# Patient Record
Sex: Female | Born: 1950
Health system: Southern US, Community
[De-identification: ages and names within clinical notes are randomized; demographics above are authoritative.]

## PROBLEM LIST (undated history)

## (undated) DIAGNOSIS — F32A Depression, unspecified: Secondary | ICD-10-CM

## (undated) DIAGNOSIS — T7840XA Allergy, unspecified, initial encounter: Secondary | ICD-10-CM

## (undated) DIAGNOSIS — D649 Anemia, unspecified: Secondary | ICD-10-CM

## (undated) DIAGNOSIS — F419 Anxiety disorder, unspecified: Secondary | ICD-10-CM

## (undated) DIAGNOSIS — F329 Major depressive disorder, single episode, unspecified: Secondary | ICD-10-CM

## (undated) HISTORY — PX: TUBAL LIGATION: SHX77

## (undated) HISTORY — DX: Major depressive disorder, single episode, unspecified: F32.9

## (undated) HISTORY — DX: Depression, unspecified: F32.A

## (undated) HISTORY — PX: OTHER SURGICAL HISTORY: SHX169

## (undated) HISTORY — DX: Allergy, unspecified, initial encounter: T78.40XA

## (undated) HISTORY — DX: Anemia, unspecified: D64.9

---

## 2002-12-09 ENCOUNTER — Inpatient Hospital Stay (HOSPITAL_COMMUNITY): Admission: EM | Admit: 2002-12-09 | Discharge: 2002-12-15 | Payer: Self-pay | Admitting: Emergency Medicine

## 2002-12-10 ENCOUNTER — Encounter: Payer: Self-pay | Admitting: Cardiology

## 2002-12-14 ENCOUNTER — Encounter (INDEPENDENT_AMBULATORY_CARE_PROVIDER_SITE_OTHER): Payer: Self-pay | Admitting: Specialist

## 2004-03-02 ENCOUNTER — Ambulatory Visit: Payer: Self-pay | Admitting: Internal Medicine

## 2005-04-07 ENCOUNTER — Ambulatory Visit: Payer: Self-pay | Admitting: Internal Medicine

## 2006-09-19 ENCOUNTER — Encounter: Payer: Self-pay | Admitting: Internal Medicine

## 2006-11-24 ENCOUNTER — Telehealth (INDEPENDENT_AMBULATORY_CARE_PROVIDER_SITE_OTHER): Payer: Self-pay | Admitting: *Deleted

## 2006-11-29 ENCOUNTER — Telehealth: Payer: Self-pay | Admitting: Internal Medicine

## 2006-12-15 ENCOUNTER — Ambulatory Visit: Payer: Self-pay | Admitting: Internal Medicine

## 2006-12-15 ENCOUNTER — Observation Stay (HOSPITAL_COMMUNITY): Admission: AD | Admit: 2006-12-15 | Discharge: 2006-12-16 | Payer: Self-pay | Admitting: Internal Medicine

## 2006-12-15 DIAGNOSIS — J309 Allergic rhinitis, unspecified: Secondary | ICD-10-CM | POA: Insufficient documentation

## 2006-12-15 DIAGNOSIS — F329 Major depressive disorder, single episode, unspecified: Secondary | ICD-10-CM

## 2006-12-15 DIAGNOSIS — D509 Iron deficiency anemia, unspecified: Secondary | ICD-10-CM

## 2006-12-26 ENCOUNTER — Ambulatory Visit: Payer: Self-pay | Admitting: Internal Medicine

## 2006-12-26 DIAGNOSIS — R930 Abnormal findings on diagnostic imaging of skull and head, not elsewhere classified: Secondary | ICD-10-CM | POA: Insufficient documentation

## 2006-12-26 LAB — CONVERTED CEMR LAB
Basophils Absolute: 0 10*3/uL (ref 0.0–0.1)
Basophils Relative: 0.1 % (ref 0.0–1.0)
Eosinophils Absolute: 0.1 10*3/uL (ref 0.0–0.6)
Eosinophils Relative: 1.5 % (ref 0.0–5.0)
HCT: 32.2 % — ABNORMAL LOW (ref 36.0–46.0)
Hemoglobin: 10.5 g/dL — ABNORMAL LOW (ref 12.0–15.0)
Lymphocytes Relative: 30.3 % (ref 12.0–46.0)
MCHC: 32.6 g/dL (ref 30.0–36.0)
MCV: 64.6 fL — ABNORMAL LOW (ref 78.0–100.0)
Monocytes Absolute: 0.2 10*3/uL (ref 0.2–0.7)
Monocytes Relative: 3.6 % (ref 3.0–11.0)
Neutro Abs: 4.3 10*3/uL (ref 1.4–7.7)
Neutrophils Relative %: 64.5 % (ref 43.0–77.0)
Platelets: 454 10*3/uL — ABNORMAL HIGH (ref 150–400)
RBC: 4.99 M/uL (ref 3.87–5.11)
RDW: 29.2 % — ABNORMAL HIGH (ref 11.5–14.6)
Sed Rate: 57 mm/hr — ABNORMAL HIGH (ref 0–25)
WBC: 6.6 10*3/uL (ref 4.5–10.5)

## 2006-12-28 ENCOUNTER — Telehealth: Payer: Self-pay | Admitting: Internal Medicine

## 2008-10-03 ENCOUNTER — Telehealth: Payer: Self-pay | Admitting: Internal Medicine

## 2009-12-19 ENCOUNTER — Telehealth: Payer: Self-pay | Admitting: Internal Medicine

## 2010-02-10 NOTE — Assessment & Plan Note (Signed)
Summary: hosp follow up per dr k/mhf/pt rescd/ccm   Vital Signs:  Patient Profile:   60 Years Old Female Weight:      152 pounds O2 Sat:      94 % Temp:     98.2 degrees F oral Pulse rate:   100 / minute Pulse rhythm:   irregular BP sitting:   122 / 66  (left arm)  Vitals Entered By: Raechel Ache, RN (December 26, 2006 4:15 PM) Oxygen therapy Room Air                 Chief Complaint:  Hosp f/u Still coughing from post-nasal drip and SOB.Amanda Hall  History of Present Illness: 60 year old female seen today for follow-up of her severe anemia.  she has a hospitalized recently for severe anemia, related to hemorrhoidal bleeding. Chest x-ray and hostile, revealed pulmonary infiltrates worrisome for interstitial lung disease. She has had no further hemorrhoidal problems or bleeding but after approximately 1 year.  Has had onset of a menses over the weekend.  She is on t.i.d. iron  Current Allergies: ! SULFA ! * TETANUS ! CODEINE      Physical Exam  General:     Well-developed,well-nourished,in no acute distress; alert,appropriate and cooperative throughout examination   Head:     Normocephalic and atraumatic without obvious abnormalities. No apparent alopecia or balding. Eyes:     No corneal or conjunctival inflammation noted. EOMI. Perrla. Funduscopic exam benign, without hemorrhages, exudates or papilledema. Vision grossly normal. Mouth:     Oral mucosa and oropharynx without lesions or exudates.  Teeth in good repair. Neck:     No deformities, masses, or tenderness noted. Lungs:     a few crackles at the left base.  O2 saturation 94% Heart:     Normal rate and regular rhythm. S1 and S2 normal without gallop, murmur, click, rub or other extra sounds. Abdomen:     Bowel sounds positive,abdomen soft and non-tender without masses, organomegaly or hernias noted.    Impression & Recommendations:  Problem # 1:  ANEMIA-IRON DEFICIENCY (ICD-280.9)  Her updated medication  list for this problem includes:    Ferro-bob 325 (65 Fe) Mg Tabs (Ferrous sulfate) .Amanda Hall... 1 three times a day  Orders: Venipuncture (03474) TLB-CBC Platelet - w/Differential (85025-CBCD)   Complete Medication List: 1)  Prozac 20 Mg Caps (Fluoxetine hcl) .Amanda Hall.. 1 once daily 2)  Ambien 10 Mg Tabs (Zolpidem tartrate) 3)  Prozac 40 Mg Caps (Fluoxetine hcl) .Amanda Hall.. 1 once daily 4)  Ferro-bob 325 (65 Fe) Mg Tabs (Ferrous sulfate) .Amanda Hall.. 1 three times a day   Patient Instructions: 1)  Please schedule a follow-up appointment in 2 weeks. 2)  continue iron therapy; report any shortness of breath; will repeat chest x-ray in two weeks    ]  Appended Document: Orders Update    Clinical Lists Changes  Observations: Added new observation of COMMENTS2: ..................................................................Amanda KitchenWynona Canes, CMA  December 27, 2006 12:15 PM  (12/26/2006 12:14)       Laboratory Results   Blood Tests    Date/Time Reported: December 27, 2006 12:15 PM   SED rate: 32  Comments: ..................................................................Amanda KitchenWynona Canes, CMA  December 27, 2006 12:15 PM

## 2010-02-10 NOTE — Progress Notes (Signed)
Summary: Pt called req refill of generid Prozac to [facility]  Phone Note Refill Request Call back at Home Phone [phone] Message from:  Patient on December 19, 2009 10:56 AM  Refills Requested: Medication #1:  PROZAC 20 MG CAPS 1 once daily   Dosage confirmed as above?Dosage Confirmed  Medication #2:  PROZAC 40 MG  CAPS 1 once daily Pt does not have insurance. Can not afford ov. Pls call in generic Prozac to [facility] on [address].     Method Requested: Telephone to [facility] on [address] Initial call taken by: Lucy Antigua,  December 19, 2009 10:57 AM  Follow-up for Phone Call        last seen 2008 - please advise Follow-up by: Duard Brady LPN,  December 19, 2009 11:35 AM  Additional Follow-up for Phone Call Additional follow up Details #1::        generic prozac 20  #90  RF 2 Additional Follow-up by: [name]  MD,  December 19, 2009 5:12 PM    Additional Follow-up for Phone Call Additional follow up Details #3:: Additional Follow-up by: [name]  MD,  December 19, 2009 5:12 PM  Prescriptions: PROZAC 20 MG CAPS (FLUOXETINE HCL) 1 once daily  #90 x 2   Entered by:   Duard Brady LPN   Authorized by:   [name]  MD   Signed by:   Duard Brady LPN on 09/81/1914   Method used:   Faxed to ...       [facility]       [address]       [place], [place]  [address]       Ph: [phone]       Fax: [phone]   RxID:   [id number]

## 2010-05-26 NOTE — Discharge Summary (Signed)
NAME:  Amanda Hall, Amanda Hall NO.:  [id number]   MEDICAL RECORD NO.:  [id number]          PATIENT TYPE:  INP   LOCATION:  5029                         FACILITY:  [facility]   PHYSICIAN:  [name], MDDATE OF BIRTH:  January 02, 1951   DATE OF ADMISSION:  12/15/2006  DATE OF DISCHARGE:  12/16/2006                               DISCHARGE SUMMARY   FINAL DIAGNOSIS:  Severe iron deficiency anemia.   ADDITIONAL DIAGNOSES:  1. Hemorrhoidal lower gastrointestinal bleeding.  2. Profound weakness.   DISCHARGE MEDICATIONS:  1. Iron sulfate 325 mg t.i.d.  2. Prozac 40 mg daily.  3. Ambien 10 mg at bedtime p.r.n. sleep.   HISTORY OF PRESENT ILLNESS:  The patient is a 60 year old female who  presented with a 54-month history of progressive weakness, dyspnea on  exertion and fatigue.  She was admitted to the hospital approximately 4  years ago for profound iron deficiency anemia.  GI evaluation included  upper and lower panendoscopy at that time.  It was unremarkable except  for internal  and external hemorrhoids.  The patient states that she has  noted bright red rectal bleeding, usually twice per week.  She is  postmenopausal without menses in excess of 1 year.  The patient was  evaluated in the clinic setting, where she had a resting tachycardia of  120 and was profoundly pale and weak.  She was subsequently admitted for  further evaluation and treatment   LABORATORY DATA/HOSPITAL COURSE:  The patient was admitted to the  hospital, where laboratory studies were obtained.  This revealed a  hemoglobin of 7.9, hematocrit 25.9, MCV was 55.8 with an elevated RDW.  Platelet count was slightly high at 684,000.  The patient was typed and  crossed and transfused 2 units of packed RBCs without difficulty.  On  the second hospital day, the patient was much improved with stable vital  signs.  Her weakness and resting tachycardia had resolved.  Other  laboratory studies included a  ferritin of 5.  TSH was normal.  Metabolic  panel revealed mild hyponatremia, otherwise unremarkable.  Coagulation  studies were normal as was urinalysis.  Chest x-ray revealed diffuse  increased lung densities at both bases, the right greater than the left,  raising the question of ongoing interstitial lung disease.  When  hospitalized for severe anemia 4 years ago, the patient had much more  prominent diffuse infiltrates as well as shortness of breath and  hypoxemia in the setting of her severe anemia.   DISPOSITION:  The patient will be discharged today to take iron  supplements 3 times daily.  She will be reassessed in the office in 3  days, and followup electrolytes and hemoglobin will be checked.  She  will be set up for a  general surgical consultation for consideration of  treatment of her hemorrhoidal disease.      [name], MD  Electronically Signed     PFK/MEDQ  D:  12/16/2006  T:  12/16/2006  Job:  [phone] [id number]

## 2010-05-29 NOTE — H&P (Signed)
NAME:  SHANDREKA, DANTE NO.:  [id number]   MEDICAL RECORD NO.:  [id number]                   PATIENT TYPE:  INP   LOCATION:  2103                                 FACILITY:  [facility]   PHYSICIAN:  Wanda Plump, MD [facility]                 DATE OF BIRTH:  19-Jun-1950   DATE OF ADMISSION:  12/09/2002  DATE OF DISCHARGE:                                HISTORY & PHYSICAL   ADDENDUM   CLARIFICATION OF THE HISTORY AND PHYSICAL:  Mrs. Vanvoorhis has been admitted to  the hospital with respiratory distress, cough, diffuse bilateral  infiltrates, and severe anemia.  The potential explanation for her symptoms  are (a) atypical pneumonia which could account for the infiltrate, the  decreased O2 saturation with an incidental finding of anemia versus (b)  severe anemia that is induced in congestive heart failure.  That would  account for the respiratory distress and bilateral infiltrates.  I favor the  first scenario.                                                Wanda Plump, MD [facility]    JEP/MEDQ  D:  12/10/2002  T:  12/10/2002  Job:  [id number]

## 2010-05-29 NOTE — H&P (Signed)
NAME:  Amanda Hall, Amanda Hall NO.:  [id number]   MEDICAL RECORD NO.:  [id number]                   PATIENT TYPE:  EMS   LOCATION:  MAJO                                 FACILITY:  [facility]   PHYSICIAN:  Wanda Plump, MD [facility]                 DATE OF BIRTH:  [birth year]   DATE OF ADMISSION:  12/09/2002  DATE OF DISCHARGE:                                HISTORY & PHYSICAL   CHIEF COMPLAINT:  Shortness of breath.   HISTORY OF PRESENT ILLNESS:  Amanda Hall is a 60 year old white female whose  primary care physician is Amanda Hall.  She presented to the ER with  several days history of shortness of breath and dyspnea on exertion,  gradually getting worse.  The symptoms became more intense in the last 2  days to the point that she decided to come to the ER tonight.  She also has  noted persistent coughing spells and chest pain located in the upper  midanterior aspect of the chest and described as a dual ache without  radiation. The patient is chest-pain free at present.  At the ER she was  diagnosed with CHF, got a dose of Lasix and admitted for further care.   PAST MEDICAL HISTORY:  1. Depression.  2. History of hemorrhoids.  3. History of allergic rhinitis.  4. Bilateral tubal ligation.  5. History of biopsy of the left wrist many years ago which was negative.   FAMILY HISTORY:  1. Sister and grandmother had diabetes.  2. Sister has hypertension.  3. Positive family history of breast cancer.  4. Mother had a stroke.  5. Grandmother had coronary artery disease.  6. Remote family history of colon cancer.  She described that her great-     grandmother did have colon cancer.   SOCIAL HISTORY:  Does not smoke or drink.  She is divorced and has no  children.   REVIEW OF SYSTEMS:  Denies any fever or chills.  She has noticed decreased  appetite and some weight loss lately.  Besides today, she denies any nausea.  No vomiting or diarrhea.  She admits having some  bleeding stools on and off  for a while, but she cannot tell with certainty the amount of frequency of  the bleeding.  There is no melena.  Denies any chronic or acute abdominal  pain except for some discomfort when she coughs.  Denies any dysuria or  hematuria.  She has vaginal bleeding only during her periods which are  irregular and at times heavy.  Very little menstrual cramps.  She denies any  recent Pap smear.  She never had a colonoscopy.   REVIEW OF SYSTEMS:  The review of systems is also positive for mild-to-  moderate headache for 2 days.   MEDICATIONS:  1. Prozac 40 one p.o. daily.  2. Occasional use of decongestant and  Midol for menstrual cramps.   ALLERGIES:  She is allergic to Colace and tetanus.   PHYSICAL EXAMINATION:  VITAL SIGNS:  Initially her heart rate was 152 with a  blood pressure of 132/63; respiratory rate 32.  O2 saturation on room air  was 67. At the time of evaluation at 6 a.m. in the morning the blood  pressure was 125/61, pulse 87, respiratory rate 30 and she is on 100%  nonrebreather.  GENERAL:  The patient is alert and oriented.  She is extremely pale.  She  has moderate respiratory distress characterized by difficulty finishing her  sentences.  HEENT:  Ears are normal.  Oropharynx is negative.  Sinuses show no  congestion of pain to palpation.  NECK:  She does not have JVD.  LUNGS:  She has diffuse crackles in both fields.  CARDIOVASCULAR:  She is tachycardic.  It is hard for me to assess for heart  murmur.  ABDOMEN:  Abdomen is not distended, soft, with positive bowel sounds.  I did  not feel any mass or organomegaly.  RECTAL:  Digital rectal exam shows brown stool.  I think I palpate a mass  that is consistent with uterus.  Hemoccult is negative.   LABORATORY AND X-RAYS:  Sodium 156, potassium 3, BUN 10, creatinine 0.7,  blood sugar 160.  LFTs are normal except for an albumin of 2.8.  Total CK is  59.  Hemoglobin is 6.9 with a white count of  15.6 and platelets of 624.  MBC  is 54 which is low and MCHC is 29 which is also low.  BMP is moderately  increased to 126.  Chest x-ray showed diffuse infiltrate consistent either  with congestive heart failure or pneumonia.   ASSESSMENT AND PLAN:  1. Amanda Hall has been admitted to the hospital with respiratory distress,     cough, diffuse bilateral infiltrates, and severe anemia.  The potential     explanation for her symptoms are an atypical pneumonia which would     account for the infiltrate, the decreased O2 saturation with the     incidence and findings of anemia versus severe anemia that is induced in     the patient's who have CHF which would also explain the respiratory     distress and bilateral infiltrates. I favor the first scenario.  2. The patient has profound anemia.  The most likely diagnosis is     gastrointestinal etiology.  3. I will go ahead and admit the patient to the unit.  I have already called     the pulmonary and the gastrointestinal service to assist me in the care     of this patient.  I am planning to take an HIV, reticulocyte count, and     LDH.  Will also get 2 more cardiac enzymes to rule out an myocardial     infarction and will check a Legionella antigen.  Since my differential     diagnosis includes atypical pneumonia, I will cover her with Avelox.     Will also schedule an Hall abdominal ultrasound to evaluate the     liver, spleen and the female organs.                                                Wanda Plump, MD [facility]    JEP/MEDQ  D:  12/09/2002  T:  12/09/2002  Job:  [id number]

## 2010-05-29 NOTE — Discharge Summary (Signed)
NAME:  Amanda Hall, Amanda Hall                              ACCOUNT NO.:  [id number]   MEDICAL RECORD NO.:  [id number]                   PATIENT TYPE:  INP   LOCATION:  5017                                 FACILITY:  [facility]   PHYSICIAN:  Ranelle Oyster, M.D.             DATE OF BIRTH:  [birth year]   DATE OF ADMISSION:  12/08/2002  DATE OF DISCHARGE:  12/15/2002                                 DISCHARGE SUMMARY   DISCHARGE DIAGNOSES:  1. Iron-deficiency anemia.  2. Respiratory insufficiency.  3. Pneumonia.   BRIEF ADMISSION HISTORY:  The patient is a 60 year old white female who  presented with complaints of shortness of breath and dyspnea on exertion  that has progressively gotten worse.   PAST MEDICAL HISTORY:  1. Depression.  2. History of hemorrhoids.  3. Sinus congestion.  4. BTL.  5. Biopsy of left breast.  6. History of arm fracture.   HOSPITAL COURSE:  Problem #1.  Pulmonary:  The patient presented with  respiratory insufficiency and chest x-ray revealing diffuse infiltrate,  representing possible atypical pneumonia.  The patient was seen in  consultation by pulmonary critical care medicine who agreed atypical  pneumonia and suspected it was infectious bacterial versus viral.  They  recommended Avelox and supportive care.  The patient Legionella antigen was  negative.  The patient's condition slowly improved with antibiotic therapy  as well as nebulizers and oxygen.  She improved to the point that she was  tolerating room air and was ready for discharge on December 15, 2002.   Problem #2.  Microcytic iron-deficiency anemia.  The patient had hemoglobin  of 6.9 on admission,  Iron studies confirmed iron deficiency.  The patient  was seen in consultation by GI who agreed that she would need endoscopy and  colonoscopy once stable from a respiratory standpoint.  She was stable on  December 14, 2002.  Colonoscopy revealed only hemorrhoids.  Endoscopy  revealed a small hiatal hernia,  otherwise negative.  The patient did have an  air-contrast barium enema on December 14, 2002, that was unremarkable.  It  was felt that more than likely the etiology of her anemia was gynecologic in  nature, and she needed follow up as an outpatient with gynecologist.   LABORATORY DATA:  Hemoglobin 8.5, stool for occult blood was negative.  Calcium 3.1.  Glucose 123, alkaline phosphatase 118, otherwise LFT's were  normal.  TSH was 2.737.  Total iron 15.  HIV nonreactive.  Urinalysis was  negative.  Blood cultures were negative x2.  Legionella antigen was  negative.  The patient had received a total of two units of packed red blood  cells during hospitalization.   MEDICATIONS ON DISCHARGE:  1. Avelox 400 mg a day for an additional three days.  2. Prozac 40 mg a day.  3. Ferrous sulfate 325 mg b.i.d.   DISCHARGE INSTRUCTIONS:  The patient was to call  for eligibility appointment  with primary care physician.      Cornell Barman, P.A. [facility]                  Ranelle Oyster, M.D.    LC/MEDQ  D:  12/26/2002  T:  12/26/2002  Job:  [id number]   cc:   [name], M.D. [facility]   Iva Boop, M.D. [facility]

## 2010-05-29 NOTE — Consult Note (Signed)
Amanda Hall, Amanda Hall                              ACCOUNT NO.:  [id number]   MEDICAL RECORD NO.:  [id number]                   PATIENT TYPE:  INP   LOCATION:  2103                                 FACILITY:  [facility]   PHYSICIAN:  Anselmo Rod, M.D.               DATE OF BIRTH:  1950-05-30   DATE OF CONSULTATION:  12/09/2002  DATE OF DISCHARGE:                                   CONSULTATION   REASON FOR CONSULTATION:  Severe anemia with hemoglobin of 6.9 gm/dL and  pneumonia.   ASSESSMENT:  1. Severe microcytic anemia with hemoglobin 6.9 gm/dL and MCV of 16.1 and     history of intermittent rectal bleeding in a 60 year old white female     with family history of colon cancer in a great-grandmother who has     chronic polyps, masses, etc.  2. Diffuse bilateral infiltrates, questionable community-acquired pneumonia     on Avelox.  3. History of chronic constipation with rectal bleeding, especially on     straining.  4. History of depression on Prozac, which she has recently restarted.  5. History of allergic rhinitis.  6. Status post bilateral tubal ligations with two abortions in her 51s.   RECOMMENDATIONS:  1. Monitor CBC closely.  Transfuse to keep the hematocrit above 30%.  2. GI workup with EGD in the colon once the acute symptoms resolve from a     respiratory standpoint.  3. Check iron studies.  The patient has had blood samples taken prior to the     transfusion.  4. PPI prophylactically for now.   HISTORY OF PRESENT ILLNESS:  Amanda Hall is a 60 year old white female who has  the above-mentioned problems and has been busy taking care of her mother who  had a stroke four years ago.  Her mother was recently discharged from [facility] after prolonged hospitalization.  The patient  developed generalized fatigue and a cough which she felt was from being  under a lot of stress and strain mentally and physically from providing care  for her mother.  She developed  a cough two months ago which became  productive two weeks ago and was admitted to the hospital earlier today with  bilateral infiltrates seen on x-ray.  The patient was found to have  leukocytosis with severe anemia and upper GI consultation was procured.  The  patient denies any abdominal pain, nausea, or vomiting at this time.  She  had mild nausea on admission but this has now resolved.  There is no history  of melena.  She has noticed some bright red blood per rectum with straining  and has had this problem for several years.  She describes a small amount of  bleeding on the toilet paper after a bowel movement.  There is a large  amount of rectal bleeding at times with blood in the commode  but has never  had a GI workup in this regard.   She is seeing Dr. [name] for depression and received Prozac  from his office but has not taken this for several months.  She has  restarted taking the Prozac two days ago.  She denies a previous history of  jaundice or colitis.  Appetite is fair as her weight has been stable.  Her  sister thinks she may have lost some weight but the patient does not confirm  this.  There are no genitourinary complaints at this time.   ALLERGIES:  CODEINE, SULFA, TETANUS TOXOID.   MEDICATIONS:  Prozac.   SOCIAL HISTORY:  She is divorced.  She was married for three years in the  mid-70s.  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  She denies the  use of alcohol, tobacco, and drugs.   FAMILY HISTORY:  Her father is 37 and has disabling arthritis and cataracts.  Her mother is 68 and has had a stroke four years ago.  She has sister who  has diabetes and a brother who has back problems with sciatica.  There is a  history of heart disease in a grandmother and breast cancer in the family.  There is no known family history of ovarian or uterine cancer.   PHYSICAL  EXAMINATION:  GENERAL:  A pleasant, cooperative middle-aged white  female sitting upright in bed with nasal cannula in place.  VITAL SIGNS:  Blood pressure 146/85, pulse 108 per minute and regular,  respiratory rate 24 per minute.  HEENT:  Normocephalic and atraumatic head.  PERRLA.  Facial symmetry  preserved.  The patient has no teeth.  NECK:  Supple.  CHEST:  Decreased breath sounds bilaterally with occasional rhonchi.  ABDOMEN:  Soft, nontender, and nondistended with normal bowel sounds.  No  evidence of hepatosplenomegaly or masses palpable.  RECTAL:  Not done.  Stool was tested for occult blood but was negative.   LABORATORY DATA:  Hematocrit of 15.6, hemoglobin of 6.9 with a MCV of 54.7,  and platelets of 624,000.  Albumin is low at 2.8.  LFTs are normal.  BNP is  high at 126.   Chest x-ray revealed bilateral infiltrates.   DISPOSITION:  Considering this data, my recommendations have been made.  Further recommendations will be made by Dr. Iva Boop who will see the  patient in the morning and follow up from here on.                                               Anselmo Rod, M.D.    JNM/MEDQ  D:  12/09/2002  T:  12/09/2002  Job:  [id number]   cc:   [name], M.D. [facility]   Iva Boop, M.D. [facility]

## 2010-07-03 ENCOUNTER — Other Ambulatory Visit: Payer: Self-pay | Admitting: Internal Medicine

## 2010-09-21 ENCOUNTER — Encounter: Payer: Self-pay | Admitting: Internal Medicine

## 2010-09-21 ENCOUNTER — Ambulatory Visit (INDEPENDENT_AMBULATORY_CARE_PROVIDER_SITE_OTHER): Payer: Self-pay | Admitting: Internal Medicine

## 2010-09-21 DIAGNOSIS — D509 Iron deficiency anemia, unspecified: Secondary | ICD-10-CM

## 2010-09-21 DIAGNOSIS — J309 Allergic rhinitis, unspecified: Secondary | ICD-10-CM

## 2010-09-21 DIAGNOSIS — L509 Urticaria, unspecified: Secondary | ICD-10-CM

## 2010-09-21 MED ORDER — FLUOXETINE HCL 20 MG PO CAPS: 20.0000 mg | ORAL_CAPSULE | Freq: Every day | ORAL | Status: DC

## 2010-09-21 NOTE — Progress Notes (Signed)
  Subjective:    Patient ID: Amanda Hall, female    DOB: 05-30-1950, 60 y.o.   MRN: [id number]  HPI  60 year old patient who is here for chief complaint of hives. She states that she has had these intermittently for months. She has no health insurance and postponed the visit. She is under considerable situational stress 2 to the demands of her father who is 59 and quite demanding. She does not work outside the home and cares for her father. He has diabetes but refuses to see a physician. She complains of increasing stress. She has been on Prozac in the past which has run out refills. She feels she did better on this medication presently she is medication free period she has a history of iron deficiency anemia and also an abnormal chest x-ray worrisome for interstitial lung disease. She has no pulmonary complaints.   Review of Systems  Constitutional: Negative.   HENT: Negative for hearing loss, congestion, sore throat, rhinorrhea, dental problem, sinus pressure and tinnitus.   Eyes: Negative for pain, discharge and visual disturbance.  Respiratory: Negative for cough and shortness of breath.   Cardiovascular: Negative for chest pain, palpitations and leg swelling.  Gastrointestinal: Negative for nausea, vomiting, abdominal pain, diarrhea, constipation, blood in stool and abdominal distention.  Genitourinary: Negative for dysuria, urgency, frequency, hematuria, flank pain, vaginal bleeding, vaginal discharge, difficulty urinating, vaginal pain and pelvic pain.  Musculoskeletal: Negative for joint swelling, arthralgias and gait problem.  Skin: Positive for rash.  Neurological: Negative for dizziness, syncope, speech difficulty, weakness, numbness and headaches.  Hematological: Negative for adenopathy.  Psychiatric/Behavioral: Positive for dysphoric mood. Negative for behavioral problems and agitation. The patient is nervous/anxious.        Objective:   Physical Exam  Constitutional: She is  oriented to person, place, and time. She appears well-developed and well-nourished. No distress.  HENT:  Head: Normocephalic.  Right Ear: External ear normal.  Left Ear: External ear normal.  Mouth/Throat: Oropharynx is clear and moist.  Eyes: Conjunctivae and EOM are normal. Pupils are equal, round, and reactive to light.  Neck: Normal range of motion. Neck supple. No thyromegaly present.  Cardiovascular: Normal rate, regular rhythm, normal heart sounds and intact distal pulses.   Pulmonary/Chest: Effort normal and breath sounds normal.  Abdominal: Soft. Bowel sounds are normal. She exhibits no mass. There is no tenderness.  Musculoskeletal: Normal range of motion.  Lymphadenopathy:    She has no cervical adenopathy.  Neurological: She is alert and oriented to person, place, and time.  Skin: Skin is warm and dry. Rash noted.       Scattered urticarial lesions most marked on the lower extremities  Psychiatric: She has a normal mood and affect. Her behavior is normal.       Quite anxious          Assessment & Plan:   Urticaria. We'll give a shot of Depo-Medrol. Stress management discussed at length. We'll treat with H2 and H1 blockers

## 2010-09-21 NOTE — Patient Instructions (Signed)
Use Benadryl 25 mg every 6 hours as needed for hives  Consider Alavert one daily as needed for hives If  hives still persist use Pepcid 20 mg twice daily

## 2010-10-19 LAB — CROSSMATCH
ABO/RH(D): A POS
Antibody Screen: NEGATIVE

## 2010-10-19 LAB — URINALYSIS, ROUTINE W REFLEX MICROSCOPIC
Bilirubin Urine: NEGATIVE
Glucose, UA: NEGATIVE
Hgb urine dipstick: NEGATIVE
Ketones, ur: NEGATIVE
Nitrite: NEGATIVE
Protein, ur: NEGATIVE
Specific Gravity, Urine: 1.017
Urobilinogen, UA: 0.2
pH: 5.5

## 2010-10-19 LAB — HEMOGLOBIN AND HEMATOCRIT, BLOOD
HCT: 30.6 — ABNORMAL LOW
Hemoglobin: 9.4 — ABNORMAL LOW

## 2010-10-19 LAB — CBC
HCT: 25.9 — ABNORMAL LOW
Hemoglobin: 7.9 — CL
MCHC: 30.5
MCV: 55.8 — ABNORMAL LOW
Platelets: 684 — ABNORMAL HIGH
RBC: 4.64
RDW: 18 — ABNORMAL HIGH
WBC: 9

## 2010-10-19 LAB — COMPREHENSIVE METABOLIC PANEL
ALT: 11
AST: 22
Albumin: 3.1 — ABNORMAL LOW
Alkaline Phosphatase: 117
BUN: 12
CO2: 20
Calcium: 8.9
Chloride: 99
Creatinine, Ser: 1.19
GFR calc Af Amer: 57 — ABNORMAL LOW
GFR calc non Af Amer: 47 — ABNORMAL LOW
Glucose, Bld: 100 — ABNORMAL HIGH
Potassium: 3.7
Sodium: 129 — ABNORMAL LOW
Total Bilirubin: 1.1
Total Protein: 8.5 — ABNORMAL HIGH

## 2010-10-19 LAB — FERRITIN: Ferritin: 5 — ABNORMAL LOW (ref 10–291)

## 2010-10-19 LAB — TSH: TSH: 2.453

## 2010-10-19 LAB — PROTIME-INR
INR: 1
Prothrombin Time: 13.2

## 2010-10-19 LAB — ABO/RH: ABO/RH(D): A POS

## 2010-10-19 LAB — APTT: aPTT: 34

## 2011-09-19 ENCOUNTER — Other Ambulatory Visit: Payer: Self-pay | Admitting: Internal Medicine

## 2011-10-12 ENCOUNTER — Other Ambulatory Visit: Payer: Self-pay | Admitting: Internal Medicine

## 2012-06-02 ENCOUNTER — Telehealth: Payer: Self-pay | Admitting: Internal Medicine

## 2012-06-02 NOTE — Telephone Encounter (Signed)
Please call pt and schedule appt not seen since 2012 can not refill medication.

## 2012-06-02 NOTE — Telephone Encounter (Signed)
Patient declined office visit, stating that she did not have the money for one currently.

## 2012-06-02 NOTE — Telephone Encounter (Signed)
PT called to request a refill of her FLUoxetine (PROZAC) 20 MG capsule, [facility] [address]. Please assist.

## 2014-12-26 ENCOUNTER — Telehealth: Payer: Self-pay | Admitting: Internal Medicine

## 2014-12-26 ENCOUNTER — Ambulatory Visit (HOSPITAL_COMMUNITY)
Admission: RE | Admit: 2014-12-26 | Discharge: 2014-12-26 | Disposition: A | Discharge status: Home / Self Care | Payer: Self-pay | Attending: Psychiatry | Admitting: Psychiatry

## 2014-12-26 DIAGNOSIS — Z823 Family history of stroke: Secondary | ICD-10-CM | POA: Insufficient documentation

## 2014-12-26 DIAGNOSIS — Z833 Family history of diabetes mellitus: Secondary | ICD-10-CM | POA: Insufficient documentation

## 2014-12-26 DIAGNOSIS — K649 Unspecified hemorrhoids: Secondary | ICD-10-CM | POA: Insufficient documentation

## 2014-12-26 DIAGNOSIS — D649 Anemia, unspecified: Secondary | ICD-10-CM | POA: Insufficient documentation

## 2014-12-26 DIAGNOSIS — Z800 Family history of malignant neoplasm of digestive organs: Secondary | ICD-10-CM | POA: Insufficient documentation

## 2014-12-26 DIAGNOSIS — F329 Major depressive disorder, single episode, unspecified: Secondary | ICD-10-CM | POA: Insufficient documentation

## 2014-12-26 DIAGNOSIS — F411 Generalized anxiety disorder: Secondary | ICD-10-CM | POA: Insufficient documentation

## 2014-12-26 DIAGNOSIS — Z803 Family history of malignant neoplasm of breast: Secondary | ICD-10-CM | POA: Insufficient documentation

## 2014-12-26 DIAGNOSIS — Z8249 Family history of ischemic heart disease and other diseases of the circulatory system: Secondary | ICD-10-CM | POA: Insufficient documentation

## 2014-12-26 NOTE — Telephone Encounter (Signed)
Please call patient now. Appears she has not been seen in 4 years and [name] is not here today. Given her symptoms she is advised to go straight to the behavioral health hospital or the ED today.

## 2014-12-26 NOTE — BH Assessment (Signed)
Tele Assessment Note   Amanda Hall is an 65 y.o. female. Pt denies SI/HI. Pt denies AVH. Pt reports severe anxiety. Pt states she worries about everything. According to the Pt, she is most fearful of getting older and dying. Pt states she has never received mental health treatment but she was prescribed Prozac in 2012 by her PCP. Pt reports depressive symptoms throughout her life. Pt also reports many traumas in her life i.e. Caring for both parents before their deaths. Pt is seeking medication to "calm her nerves." Pt denies SA. Pt denies abuse.   Writer consulted with Amanda Hall, Amanda Hall. Per Amanda Hall, Amanda Hall Pt does not meet inpatient treatment. Pt provided with outpatient resources.  Diagnosis: F41.1 Generalized anxiety disorder  Past Medical History:  Past Medical History  Diagnosis Date  . Hemorrhoids   . Anemia   . Allergy   . Depression     Past Surgical History  Procedure Laterality Date  . Tubal ligation    . Arm surgery      Left    Family History:  Family History  Problem Relation Age of Onset  . Stroke Mother   . Hypertension Sister   . Cancer Neg Hx     breat and colon hx  . Diabetes Neg Hx     family    Social History:  reports that she has never smoked. She has never used smokeless tobacco. She reports that she does not drink alcohol or use illicit drugs.  Additional Social History:  Alcohol / Drug Use Pain Medications: Pt denies Prescriptions: Pt denies Over the Counter: "holistic medication" History of alcohol / drug use?: No history of alcohol / drug abuse Longest period of sobriety (when/how long): NA  CIWA:   COWS:    PATIENT STRENGTHS: (choose at least two) Average or above average intelligence Communication skills  Allergies:  Allergies  Allergen Reactions  . Codeine   . Sulfonamide Derivatives   . Tetanus Toxoid     Home Medications:  (Not in a hospital admission)  OB/GYN Status:  No LMP recorded. Patient is postmenopausal.  General Assessment  Data Location of Assessment: [facility] Assessment Services TTS Assessment: In system Is this a Tele or Face-to-Face Assessment?: Face-to-Face Is this an Initial Assessment or a Re-assessment for this encounter?: Initial Assessment Marital status: Single Maiden name: NA Is patient pregnant?: No Pregnancy Status: No Living Arrangements: Other relatives Can pt return to current living arrangement?: Yes Admission Status: Voluntary Is patient capable of signing voluntary admission?: Yes Referral Source: Self/Family/Friend Insurance type: SP     Crisis Care Plan Living Arrangements: Other relatives Legal Guardian: Other: (NA) Name of Psychiatrist: NA Name of Therapist: NA  Education Status Is patient currently in school?: No Current Grade: NA Highest grade of school patient has completed: 12 Name of school: NA Contact person: NA  Risk to self with the past 6 months Suicidal Ideation: No Has patient been a risk to self within the past 6 months prior to admission? : No Suicidal Intent: No Has patient had any suicidal intent within the past 6 months prior to admission? : No Is patient at risk for suicide?: No Suicidal Plan?: No Has patient had any suicidal plan within the past 6 months prior to admission? : No Access to Means: No What has been your use of drugs/alcohol within the last 12 months?: NA Previous Attempts/Gestures: No How many times?: 0 Other Self Harm Risks: NA Triggers for Past Attempts: None known Intentional Self Injurious Behavior: None  Family Suicide History: No Recent stressful life event(s): Other (Comment) Persecutory voices/beliefs?: No Depression: Yes Depression Symptoms: Loss of interest in usual pleasures, Fatigue, Feeling worthless/self pity, Feeling angry/irritable Substance abuse history and/or treatment for substance abuse?: No Suicide prevention information given to non-admitted patients: Not applicable  Risk to Others within the past 6  months Homicidal Ideation: No Does patient have any lifetime risk of violence toward others beyond the six months prior to admission? : No Thoughts of Harm to Others: No Current Homicidal Intent: No Current Homicidal Plan: No Access to Homicidal Means: No Identified Victim: NA History of harm to others?: No Assessment of Violence: None Noted Violent Behavior Description: NA Does patient have access to weapons?: No Criminal Charges Pending?: No Does patient have a court date: No Is patient on probation?: No  Psychosis Hallucinations: None noted Delusions: None noted  Mental Status Report Appearance/Hygiene: Unremarkable Eye Contact: Fair Motor Activity: Freedom of movement Speech: Logical/coherent Level of Consciousness: Alert Mood: Depressed, Sad Affect: Depressed, Sad Anxiety Level: None Thought Processes: Coherent, Relevant Judgement: Unimpaired Orientation: Person, Place, Time, Situation Obsessive Compulsive Thoughts/Behaviors: None  Cognitive Functioning Concentration: Normal Memory: Recent Intact, Remote Intact IQ: Average Insight: Fair Impulse Control: Fair Appetite: Poor Weight Loss: 0 Weight Gain: 0 Sleep: Decreased Total Hours of Sleep: 4 Vegetative Symptoms: None  ADLScreening [facility]) Patient's cognitive ability adequate to safely complete daily activities?: Yes Patient able to express need for assistance with ADLs?: Yes Independently performs ADLs?: Yes (appropriate for developmental age)  Prior Inpatient Therapy Prior Inpatient Therapy: No Prior Therapy Dates: NA Prior Therapy Facilty/Provider(s): na Reason for Treatment: NA  Prior Outpatient Therapy Prior Outpatient Therapy: No Prior Therapy Dates: NA Prior Therapy Facilty/Provider(s): NA Reason for Treatment: NA Does patient have an ACCT team?: No Does patient have Intensive In-House Services?  : No Does patient have [facility] services? : No Does patient have P4CC  services?: No  ADL Screening (condition at time of admission) Patient's cognitive ability adequate to safely complete daily activities?: Yes Is the patient deaf or have difficulty hearing?: No Does the patient have difficulty seeing, even when wearing glasses/contacts?: No Does the patient have difficulty concentrating, remembering, or making decisions?: No Patient able to express need for assistance with ADLs?: Yes Does the patient have difficulty dressing or bathing?: No Independently performs ADLs?: Yes (appropriate for developmental age) Does the patient have difficulty walking or climbing stairs?: No Weakness of Legs: None Weakness of Arms/Hands: None       Abuse/Neglect Assessment (Assessment to be complete while patient is alone) Physical Abuse: Denies Verbal Abuse: Denies Sexual Abuse: Denies Exploitation of patient/patient's resources: Denies Self-Neglect: Denies Values / Beliefs Cultural Requests During Hospitalization: None Spiritual Requests During Hospitalization: None   Advance Directives (For Healthcare) Does patient have an advance directive?: No Would patient like information on creating an advanced directive?: No - patient declined information    Additional Information 1:1 In Past 12 Months?: No CIRT Risk: No Elopement Risk: No Does patient have medical clearance?: No     Disposition:  Disposition Initial Assessment Completed for this Encounter: Yes Disposition of Patient: Outpatient treatment Type of outpatient treatment: Adult  Amanda Hall 12/26/2014 6:25 PM

## 2014-12-26 NOTE — Telephone Encounter (Signed)
Ms. Amanda Hall called to see if she can be seen today. She doesn't think she can wait until Monday to see [name] due to her anxiety level being extreme today. She said she has feelings of depression, anxiety, paranoia, and doesn't feel safe. She wanted to be seen asap so I scheduled her with [name] tomorrow in addition to her Monday visit with [name]. Please give her a call regarding this.   Pt's ph# [phone] Thank you.

## 2014-12-26 NOTE — Telephone Encounter (Signed)
Returned call to patient per Dr. [name]'s instructions.  Pt reports experiencing severe anxiety, depression, paranoia, loss of appetite, weight loss, mood swings and uncontrollable crying.  Pt denies suicidal ideations.  However, states she has a fear of dying and does not wish to harm herself or others.  Symptoms started in April 2016 and have progressively worsened, with the last few days being the worse.  Pt reports switching to all natural medications as she sees a herbal specialist by the name of Amanda Hall.  She started taking several herbal medications back in August 2016 that she continues to takes presently.  Pt instructed to seek treatment at behavioral health or the ED immediately.  Pt agreed to seek treatment at behavioral health and will have her brother Amanda Hall take her there now.  Pt informed her appointment with [name] will be cancelled for tomorrow.  However, the original appointment she had with Dr. [name] on Monday, 12/19 will remain.

## 2014-12-27 ENCOUNTER — Ambulatory Visit: Payer: Self-pay | Admitting: Family Medicine

## 2014-12-30 ENCOUNTER — Encounter: Payer: Self-pay | Admitting: Internal Medicine

## 2014-12-30 ENCOUNTER — Ambulatory Visit (INDEPENDENT_AMBULATORY_CARE_PROVIDER_SITE_OTHER): Payer: Self-pay | Admitting: Internal Medicine

## 2014-12-30 VITALS — BP 146/90 | HR 116 | Temp 98.5°F | Resp 18 | Ht 67.0 in | Wt 134.0 lb

## 2014-12-30 DIAGNOSIS — F32A Depression, unspecified: Secondary | ICD-10-CM

## 2014-12-30 DIAGNOSIS — F329 Major depressive disorder, single episode, unspecified: Secondary | ICD-10-CM

## 2014-12-30 MED ORDER — LORAZEPAM 0.5 MG PO TABS: 0.5000 mg | ORAL_TABLET | Freq: Two times a day (BID) | ORAL | Status: DC | PRN

## 2014-12-30 MED ORDER — LORAZEPAM 0.5 MG PO TABS
0.5000 mg | ORAL_TABLET | Freq: Two times a day (BID) | ORAL | Status: DC | PRN
Start: 2014-12-30 — End: 2014-12-30

## 2014-12-30 MED ORDER — ESCITALOPRAM OXALATE 5 MG PO TABS: 5.0000 mg | ORAL_TABLET | Freq: Every day | ORAL | Status: DC

## 2014-12-30 NOTE — Progress Notes (Signed)
Subjective:    Patient ID: Amanda Hall, female    DOB: 11-04-1950, 64 y.o.   MRN: [id number]  HPI  64 year old patient who has a long history of depression.  She was last seen in 04-Jun-2010 and is seen infrequently.  She has no health insurance and does not qualify for Medicaid.  She has been treated with fluoxetine in the past. She has done poorly since 2022-06-04 when she noted increasing anxiety, excessive worry and fear of death.  She has been quite tearful, depressed, nervous.  She ventures out of her home infrequently and spends most days in bed.  She complains of poor appetite and weight loss. She states that she continues to have spotting and has never had total cessation of menses.  She takes supplemental iron  She lives in [place] for a period of time to take care of her elderly mother and relocated to [place] in 06/04/99.  She assisted in the care of her father who died in 04-Jun-2011.  Presently she lives with a sister and also has 2 dogs in the household.  A brother lives next door  Past Medical History  Diagnosis Date  . Hemorrhoids   . Anemia   . Allergy   . Depression     Social History   Social History  . Marital Status: Divorced    Spouse Name: N/A  . Number of Children: N/A  . Years of Education: N/A   Occupational History  . Not on file.   Social History Main Topics  . Smoking status: Never Smoker   . Smokeless tobacco: Never Used  . Alcohol Use: No  . Drug Use: No  . Sexual Activity: Not on file   Other Topics Concern  . Not on file   Social History Narrative    Past Surgical History  Procedure Laterality Date  . Tubal ligation    . Arm surgery      Left    Family History  Problem Relation Age of Onset  . Stroke Mother   . Hypertension Sister   . Cancer Neg Hx     breat and colon hx  . Diabetes Neg Hx     family    Allergies  Allergen Reactions  . Codeine   . Sulfonamide Derivatives   . Tetanus Toxoid     Current Outpatient Prescriptions on File  Prior to Visit  Medication Sig Dispense Refill  . ferrous sulfate 325 (65 FE) MG tablet Take 325 mg by mouth 3 (three) times daily with meals.       No current facility-administered medications on file prior to visit.    BP 146/90 mmHg  Pulse 116  Temp(Src) 98.5 F (36.9 C) (Oral)  Resp 18  Ht  (1.702 m)  Wt 134 lb (60.782 kg)  BMI 20.98 kg/m2  SpO2 98%     Review of Systems  Constitutional: Positive for activity change, appetite change, fatigue and unexpected weight change.  HENT: Negative for congestion, dental problem, hearing loss, rhinorrhea, sinus pressure, sore throat and tinnitus.   Eyes: Negative for pain, discharge and visual disturbance.  Respiratory: Negative for cough and shortness of breath.   Cardiovascular: Negative for chest pain, palpitations and leg swelling.  Gastrointestinal: Negative for nausea, vomiting, abdominal pain, diarrhea, constipation, blood in stool and abdominal distention.  Genitourinary: Negative for dysuria, urgency, frequency, hematuria, flank pain, vaginal bleeding, vaginal discharge, difficulty urinating, vaginal pain and pelvic pain.  Musculoskeletal: Negative for joint swelling, arthralgias and gait  problem.  Skin: Negative for rash.  Neurological: Negative for dizziness, syncope, speech difficulty, weakness, numbness and headaches.  Hematological: Negative for adenopathy.  Psychiatric/Behavioral: Positive for behavioral problems, dysphoric mood and decreased concentration. Negative for agitation. The patient is nervous/anxious.        Objective:   Physical Exam  Constitutional: She is oriented to person, place, and time. She appears well-developed and well-nourished.  HENT:  Head: Normocephalic.  Right Ear: External ear normal.  Left Ear: External ear normal.  Mouth/Throat: Oropharynx is clear and moist.  Eyes: Conjunctivae and EOM are normal. Pupils are equal, round, and reactive to light.  Neck: Normal range of motion. Neck  supple. No thyromegaly present.  Cardiovascular: Normal rate, regular rhythm, normal heart sounds and intact distal pulses.   Pulmonary/Chest: Effort normal and breath sounds normal.  Abdominal: Soft. Bowel sounds are normal. She exhibits no mass. There is no tenderness.  Musculoskeletal: Normal range of motion.  Lymphadenopathy:    She has no cervical adenopathy.  Neurological: She is alert and oriented to person, place, and time.  Skin: Skin is warm and dry. No rash noted.  Psychiatric: She has a normal mood and affect. Her behavior is normal. Judgment and thought content normal.  Depressed affect          Assessment & Plan:   Major depression.  Will place on Lexapro 5 mg daily.  We'll also place on lorazepam twice a day when necessary.  Anticipate up titration in Lexapro dose in 2-4 weeks Health maintenance.  [facility]  Needs ongoing medical care.  Gynecologic referral.  Updated lab.  Patient information dispensed

## 2014-12-30 NOTE — Progress Notes (Signed)
Pre visit review using our clinic review tool, if applicable. No additional management support is needed unless otherwise documented below in the visit note. 

## 2014-12-30 NOTE — Patient Instructions (Signed)
Follow-up with [facility] Please call for appointment  [phone] 4 4  4

## 2014-12-31 ENCOUNTER — Telehealth: Payer: Self-pay | Admitting: Internal Medicine

## 2014-12-31 MED ORDER — SERTRALINE HCL 25 MG PO TABS: 25.0000 mg | ORAL_TABLET | Freq: Every day | ORAL | Status: DC

## 2014-12-31 NOTE — Telephone Encounter (Signed)
Spoke to pt, told her new Rx sent to pharmacy Zoloft 25 mg one tablet daily. Let me know if too expensive there is one more drug we can try. Pt verbalized understanding.

## 2014-12-31 NOTE — Telephone Encounter (Signed)
Please see message and advise 

## 2014-12-31 NOTE — Telephone Encounter (Signed)
Please call in a prescription for generic Zoloft 25 mg or generic Celexa 20 mg  #60 each.  Either will be acceptable, and hopefully, much less expensive than the generic Lexapro

## 2014-12-31 NOTE — Telephone Encounter (Signed)
Ms. Amanda Hall called saying she called [facility] in regards to the price of the Generic Lexapro and was told 60 days = $204. She said she can't afford that and is wondering if [name] will send a Rx that will cost much less so she can pick it up. She'd like a phone call regarding this whether he can or can't prescribe something else.  Pt's ph# [phone] Thank you.

## 2014-12-31 NOTE — Telephone Encounter (Signed)
Spoke to pt, asked her if she picked up Rx? Pt said yes she was able to get it and she took one when she got home. Told her okay call if any problems. Pt verbalized understanding.

## 2015-01-14 ENCOUNTER — Telehealth: Payer: Self-pay | Admitting: Internal Medicine

## 2015-01-14 MED ORDER — LORAZEPAM 0.5 MG PO TABS: 0.5000 mg | ORAL_TABLET | Freq: Two times a day (BID) | ORAL | Status: DC | PRN

## 2015-01-14 NOTE — Telephone Encounter (Signed)
Pt was last seen on 12/19 and pt would like donna to return her call concerning [facility]

## 2015-01-14 NOTE — Telephone Encounter (Signed)
Spoke to pt, said [facility] and was told they are not accepting new patients at present to try back Feb. Pt said she was given another number for [facility] that they have an area there like them that may have appts. Pt said she tried the number but no one answered and she did not leave a message. Pt wonder if she should follow up with [name] in a month  Pt said she is doing better, taking Lorazepam twice a day and is helping but will not have enough will need new Rx and changed pharmacy to [facility] on [address]. Told pt okay will check with [name] if okay to give her 60 tablets on new Rx and will call into pharmacy. If there is a problem I will call you. Pt verbalized understanding. Told pt I would try that number again and leave a message and if can not get an appt would [facility] at the end of the month of Jan if still not taking new pt's can then schedule with [name]. Pt verbalized understanding.   Disccused Lorazepam with [name] okay to increase to 60 tablets so pt can take twice a day. Rx called into pharmacy.

## 2015-02-10 ENCOUNTER — Encounter: Payer: Self-pay | Admitting: Internal Medicine

## 2015-02-10 ENCOUNTER — Telehealth: Payer: Self-pay | Admitting: Internal Medicine

## 2015-02-10 ENCOUNTER — Ambulatory Visit (INDEPENDENT_AMBULATORY_CARE_PROVIDER_SITE_OTHER): Payer: Self-pay | Admitting: Internal Medicine

## 2015-02-10 VITALS — BP 150/90 | HR 80 | Temp 97.4°F | Resp 20 | Ht 67.0 in | Wt 130.0 lb

## 2015-02-10 DIAGNOSIS — F32A Depression, unspecified: Secondary | ICD-10-CM

## 2015-02-10 DIAGNOSIS — F329 Major depressive disorder, single episode, unspecified: Secondary | ICD-10-CM

## 2015-02-10 MED ORDER — FLUOXETINE HCL 20 MG PO TABS: 20.0000 mg | ORAL_TABLET | Freq: Every day | ORAL | Status: DC

## 2015-02-10 MED ORDER — LORAZEPAM 0.5 MG PO TABS: 0.5000 mg | ORAL_TABLET | Freq: Two times a day (BID) | ORAL | Status: DC | PRN

## 2015-02-10 MED ORDER — FLUOXETINE HCL 20 MG PO CAPS: 20.0000 mg | ORAL_CAPSULE | Freq: Every day | ORAL | Status: DC

## 2015-02-10 MED ORDER — FLUOXETINE HCL 20 MG PO TABS
20.0000 mg | ORAL_TABLET | Freq: Every day | ORAL | Status: DC
Start: 2015-02-10 — End: 2015-02-10

## 2015-02-10 NOTE — Telephone Encounter (Signed)
FLUoxetine (PROZAC) 20 MG tablet   Pt said this med is 98.00. Was told by pharmacy capsule is cheaper.    Pharmacy' [facility] [address]

## 2015-02-10 NOTE — Telephone Encounter (Signed)
ok 

## 2015-02-10 NOTE — Telephone Encounter (Signed)
Okay if I change tablet to a capsule due to cost?

## 2015-02-10 NOTE — Telephone Encounter (Signed)
Rx sent to pharmacy, capsule instead of tablet.

## 2015-02-10 NOTE — Patient Instructions (Signed)
Call or return to clinic prn if these symptoms worsen or fail to improve as anticipated.  Please call again [facility]  Consider applying for Medicaid

## 2015-02-10 NOTE — Progress Notes (Signed)
Subjective:    Patient ID: Amanda Hall, female    DOB: Mar 09, 1950, 65 y.o.   MRN: [id number]  HPI Wt Readings from Last 3 Encounters:  02/10/15 130 lb (58.968 kg)  12/30/14 134 lb (60.782 kg)  09/21/10 173 lb (78.48 kg)   65 year old patient who is seen today for follow-up of anxiety, depression.  She states that she is about the same as far as the depression, but on closer questioning, she states that she is having much less excess  Worry, her appetite has improved and she is having fewer mood swings. She was placed on sertraline but has also done quite well with fluoxetine in the past.  She is using lorazepam twice daily, which has also been quite helpful.  She is asking about a dose increase  She has no insurance and was referred to [facility].  She was told to call back in a month since no appointments were available  Past Medical History  Diagnosis Date  . Hemorrhoids   . Anemia   . Allergy   . Depression     Social History   Social History  . Marital Status: Divorced    Spouse Name: N/A  . Number of Children: N/A  . Years of Education: N/A   Occupational History  . Not on file.   Social History Main Topics  . Smoking status: Never Smoker   . Smokeless tobacco: Never Used  . Alcohol Use: No  . Drug Use: No  . Sexual Activity: Not on file   Other Topics Concern  . Not on file   Social History Narrative    Past Surgical History  Procedure Laterality Date  . Tubal ligation    . Arm surgery      Left    Family History  Problem Relation Age of Onset  . Stroke Mother   . Hypertension Sister   . Cancer Neg Hx     breat and colon hx  . Diabetes Neg Hx     family    Allergies  Allergen Reactions  . Codeine   . Sulfonamide Derivatives   . Tetanus Toxoid     Current Outpatient Prescriptions on File Prior to Visit  Medication Sig Dispense Refill  . ferrous sulfate 325 (65 FE) MG tablet Take 325 mg by mouth 3 (three)  times daily with meals.      Marland Kitchen LORazepam (ATIVAN) 0.5 MG tablet Take 1 tablet (0.5 mg total) by mouth 2 (two) times daily as needed for anxiety. 60 tablet 2  . sertraline (ZOLOFT) 25 MG tablet Take 1 tablet (25 mg total) by mouth daily. 60 tablet 1   No current facility-administered medications on file prior to visit.    BP 150/90 mmHg  Pulse 80  Temp(Src) 97.4 F (36.3 C) (Oral)  Resp 20  Ht  (1.702 m)  Wt 130 lb (58.968 kg)  BMI 20.36 kg/m2  SpO2 98%     Review of Systems  Constitutional: Negative.   HENT: Negative for congestion, dental problem, hearing loss, rhinorrhea, sinus pressure, sore throat and tinnitus.   Eyes: Negative for pain, discharge and visual disturbance.  Respiratory: Negative for cough and shortness of breath.   Cardiovascular: Negative for chest pain, palpitations and leg swelling.  Gastrointestinal: Negative for nausea, vomiting, abdominal pain, diarrhea, constipation, blood in stool and abdominal distention.  Genitourinary: Negative for dysuria, urgency, frequency, hematuria, flank pain, vaginal bleeding, vaginal discharge, difficulty urinating, vaginal pain and  pelvic pain.  Musculoskeletal: Negative for joint swelling, arthralgias and gait problem.  Skin: Negative for rash.  Neurological: Negative for dizziness, syncope, speech difficulty, weakness, numbness and headaches.  Hematological: Negative for adenopathy.  Psychiatric/Behavioral: Positive for behavioral problems and dysphoric mood. Negative for agitation. The patient is nervous/anxious.        Objective:   Physical Exam  Constitutional: She appears well-nourished. No distress.  Does not appear to be depressed Repeat blood pressure 130/80  Eyes: Conjunctivae are normal. Pupils are equal, round, and reactive to light.  Neck: Neck supple. No thyromegaly present.  Cardiovascular: Regular rhythm.   Pulmonary/Chest: Effort normal and breath sounds normal.  Musculoskeletal: She exhibits no  edema.          Assessment & Plan:   Anxiety, depression.  Patient states that she felt she had a better response to fluoxetine.  We'll switch Continue lorazepam when necessary She will continue her efforts at follow-up at the [facility] She has also been urged to apply for [facility]

## 2015-02-10 NOTE — Progress Notes (Signed)
Pre visit review using our clinic review tool, if applicable. No additional management support is needed unless otherwise documented below in the visit note. 

## 2015-06-24 ENCOUNTER — Telehealth: Payer: Self-pay | Admitting: Internal Medicine

## 2015-06-24 NOTE — Telephone Encounter (Signed)
Pt would like to increase lorazepam 0.5 mg to next highest dose [facility] [address],

## 2015-06-24 NOTE — Telephone Encounter (Signed)
Please see message and advise, pt last seen 01/2015.

## 2015-06-24 NOTE — Telephone Encounter (Signed)
Okay to increase to a 3 times a day regimen as needed.  Continue present dose

## 2015-06-24 NOTE — Telephone Encounter (Signed)
Spoke to pt, told her okay to increase to 3 times a day as needed, same dose. Pt verbalized understanding. Told pt need to schedule follow up appt in 2 weeks tomake sure on correct dose. Pt verbalized understanding.

## 2015-07-04 ENCOUNTER — Telehealth: Payer: Self-pay | Admitting: Internal Medicine

## 2015-07-04 MED ORDER — LORAZEPAM 0.5 MG PO TABS: 0.5000 mg | ORAL_TABLET | Freq: Three times a day (TID) | ORAL | Status: DC | PRN

## 2015-07-04 NOTE — Telephone Encounter (Signed)
Spoke to pt, told her Dr.K is happy to see her anytime she feels she needs to be seen. Pt verbalized understanding. Asked pt if being followed by the [facility]? Pt said no, every time she calls there are no appts. Told her okay, will let Dr.K know. I called refills into the pharmacy for you for 6 months try to make an appt before refills run out to follow up. Pt verbalized understanding.

## 2015-07-04 NOTE — Telephone Encounter (Signed)
Please advise when pt needs to come back, see message. I already called refill in.

## 2015-07-04 NOTE — Telephone Encounter (Signed)
Pt states her increase of LORazepam (ATIVAN) 0.5 MG tablet To 3 x/day is working good.  Pt will need a new rx  [facility] [employer] [address]  Pt would lke to know if she needs a follow up any time soon?

## 2015-07-04 NOTE — Telephone Encounter (Signed)
We'll be happy to see any time Last visit, she had no health insurance Is she  being [facility]?

## 2015-08-12 ENCOUNTER — Telehealth: Payer: Self-pay | Admitting: Internal Medicine

## 2015-08-12 NOTE — Telephone Encounter (Signed)
Pt has been summoned for jury and would like to request a letter from Dr Kirtland Bouchard stating for her to be excused from jury duty. Pt states with her anxiety she cannot be around people, it's just too stressful.  Pt is going to bring the paperwork for courthouse this afternoon.  Juror LE:[id number] Panel no:  [id number]   Attn: Volanda Napoleon Request [address] [place] [address]

## 2015-08-18 NOTE — Telephone Encounter (Signed)
Letter dictated August 2

## 2015-08-18 NOTE — Telephone Encounter (Signed)
Requesting a Letter From [name] stating for her to be excused from jury duty.  See Attached note.

## 2015-10-19 ENCOUNTER — Other Ambulatory Visit: Payer: Self-pay | Admitting: Internal Medicine

## 2015-10-20 NOTE — Telephone Encounter (Signed)
Refill for Fluoxetine and Lorazepam Last Ov: 02/10/15 no pending appt Last Rx: Lorazepam 07/04/15 #90 2 RF                Fluoxetine 02/10/15 # 90 3 RF Please advise

## 2015-10-24 NOTE — Telephone Encounter (Signed)
Please advise on refills.  

## 2015-10-24 NOTE — Telephone Encounter (Signed)
Ok to refill Prozac for six months  Ok to refill Ativan 90 pills, 0 refills

## 2015-10-28 ENCOUNTER — Telehealth: Payer: Self-pay | Admitting: Internal Medicine

## 2015-10-28 ENCOUNTER — Other Ambulatory Visit: Payer: Self-pay | Admitting: Internal Medicine

## 2015-10-28 NOTE — Telephone Encounter (Signed)
Pt need new Rx for FLUoxetine and LORazepam   [facility] [address]  Pt state is out.

## 2015-10-28 NOTE — Telephone Encounter (Signed)
Rxs were sent to pharmacy

## 2015-12-02 ENCOUNTER — Other Ambulatory Visit: Payer: Self-pay | Admitting: Internal Medicine

## 2016-03-29 ENCOUNTER — Other Ambulatory Visit: Payer: Self-pay | Admitting: Adult Health

## 2016-03-29 ENCOUNTER — Other Ambulatory Visit: Payer: Self-pay | Admitting: Internal Medicine

## 2016-03-30 NOTE — Telephone Encounter (Signed)
Dr K patient

## 2016-04-02 ENCOUNTER — Other Ambulatory Visit: Payer: Self-pay | Admitting: Internal Medicine

## 2016-04-02 ENCOUNTER — Telehealth: Payer: Self-pay | Admitting: Internal Medicine

## 2016-04-02 NOTE — Telephone Encounter (Signed)
° ° ° °  Pt request refill of the following:  LORazepam (ATIVAN) 0.5 MG tablet  FLUoxetine (PROZAC) 20 MG capsule   [facility]: [facility] [address]

## 2016-04-05 NOTE — Telephone Encounter (Signed)
Prozac filled on 03/30/16 and Ativan filled on 04/05/16

## 2016-07-10 ENCOUNTER — Encounter: Payer: Self-pay | Admitting: Internal Medicine

## 2016-07-13 ENCOUNTER — Encounter: Payer: Self-pay | Admitting: Internal Medicine

## 2016-07-13 ENCOUNTER — Ambulatory Visit (INDEPENDENT_AMBULATORY_CARE_PROVIDER_SITE_OTHER): Payer: Medicare Other | Admitting: Internal Medicine

## 2016-07-13 VITALS — BP 142/84 | Temp 98.3°F | Wt 137.2 lb

## 2016-07-13 DIAGNOSIS — J3089 Other allergic rhinitis: Secondary | ICD-10-CM

## 2016-07-13 DIAGNOSIS — F411 Generalized anxiety disorder: Secondary | ICD-10-CM

## 2016-07-13 DIAGNOSIS — F331 Major depressive disorder, recurrent, moderate: Secondary | ICD-10-CM | POA: Diagnosis not present

## 2016-07-13 DIAGNOSIS — E785 Hyperlipidemia, unspecified: Secondary | ICD-10-CM

## 2016-07-13 DIAGNOSIS — D508 Other iron deficiency anemias: Secondary | ICD-10-CM

## 2016-07-13 LAB — CBC WITH DIFFERENTIAL/PLATELET
Basophils Absolute: 0 10*3/uL (ref 0.0–0.1)
Basophils Relative: 0.8 % (ref 0.0–3.0)
EOS ABS: 0 10*3/uL (ref 0.0–0.7)
Eosinophils Relative: 0.5 % (ref 0.0–5.0)
HCT: 38.8 % (ref 36.0–46.0)
HEMOGLOBIN: 13.3 g/dL (ref 12.0–15.0)
Lymphocytes Relative: 26.5 % (ref 12.0–46.0)
Lymphs Abs: 1.5 10*3/uL (ref 0.7–4.0)
MCHC: 34.2 g/dL (ref 30.0–36.0)
MCV: 85.2 fl (ref 78.0–100.0)
MONO ABS: 0.4 10*3/uL (ref 0.1–1.0)
Monocytes Relative: 6.4 % (ref 3.0–12.0)
Neutro Abs: 3.7 10*3/uL (ref 1.4–7.7)
Neutrophils Relative %: 65.8 % (ref 43.0–77.0)
Platelets: 315 10*3/uL (ref 150.0–400.0)
RBC: 4.55 Mil/uL (ref 3.87–5.11)
RDW: 13.3 % (ref 11.5–15.5)
WBC: 5.6 10*3/uL (ref 4.0–10.5)

## 2016-07-13 LAB — COMPREHENSIVE METABOLIC PANEL
ALBUMIN: 4.3 g/dL (ref 3.5–5.2)
ALK PHOS: 72 U/L (ref 39–117)
ALT: 9 U/L (ref 0–35)
AST: 18 U/L (ref 0–37)
BUN: 17 mg/dL (ref 6–23)
CALCIUM: 10.6 mg/dL — AB (ref 8.4–10.5)
CO2: 32 mEq/L (ref 19–32)
CREATININE: 1.05 mg/dL (ref 0.40–1.20)
Chloride: 103 mEq/L (ref 96–112)
GFR: 55.9 mL/min — ABNORMAL LOW (ref >60.00)
Glucose, Bld: 89 mg/dL (ref 70–99)
POTASSIUM: 3.8 meq/L (ref 3.5–5.1)
SODIUM: 142 meq/L (ref 135–145)
TOTAL PROTEIN: 7.1 g/dL (ref 6.0–8.3)
Total Bilirubin: 0.6 mg/dL (ref 0.2–1.2)

## 2016-07-13 LAB — LIPID PANEL
CHOLESTEROL: 298 mg/dL — AB (ref 0–200)
HDL: 45.2 mg/dL (ref >39.00)
LDL Cholesterol: 226 mg/dL — ABNORMAL HIGH (ref 0–99)
NonHDL: 253.07
TRIGLYCERIDES: 136 mg/dL (ref 0.0–149.0)
Total CHOL/HDL Ratio: 7
VLDL: 27.2 mg/dL (ref 0.0–40.0)

## 2016-07-13 LAB — TSH: TSH: 1.87 u[IU]/mL (ref 0.35–4.50)

## 2016-07-13 MED ORDER — LORAZEPAM 1 MG PO TABS: 1.0000 mg | ORAL_TABLET | Freq: Two times a day (BID) | ORAL | 0 refills | Status: DC

## 2016-07-13 MED ORDER — FLUOXETINE HCL 40 MG PO CAPS: 40.0000 mg | ORAL_CAPSULE | Freq: Every day | ORAL | 3 refills | Status: DC

## 2016-07-13 NOTE — Progress Notes (Signed)
Subjective:    Patient ID: Amanda Hall, female    DOB: [birth year], 66 y.o.   MRN: [id number]  HPI 66 year old patient who is seen today in follow-up.she has a long history of anxiety, depression and has been seen very infrequently due to lack of health insurance.  Last year.  Sertraline was discontinued and fluoxetine.  Substituted, which she felt was a better drug in the past.  She remains on lorazepam. She complains of excessive anxiety and worry.  She is fearful of death in the health of her family.  She is tearful intermittently during the encounter today. Behavioral health referral.  Discussed that she has been very reluctant. [facility], but she did not pursue this referral  Past Medical History:  Diagnosis Date  . Allergy   . Anemia   . Depression   . Hemorrhoids      Social History   Social History  . Marital status: Divorced    Spouse name: N/A  . Number of children: N/A  . Years of education: N/A   Occupational History  . Not on file.   Social History Main Topics  . Smoking status: Never Smoker  . Smokeless tobacco: Never Used  . Alcohol use No  . Drug use: No  . Sexual activity: Not on file   Other Topics Concern  . Not on file   Social History Narrative  . No narrative on file    Past Surgical History:  Procedure Laterality Date  . arm surgery     Left  . TUBAL LIGATION      Family History  Problem Relation Age of Onset  . Stroke Mother   . Hypertension Sister   . Cancer Neg Hx        breat and colon hx  . Diabetes Neg Hx        family    Allergies  Allergen Reactions  . Codeine   . Sulfonamide Derivatives   . Tetanus Toxoid     Current Outpatient Prescriptions on File Prior to Visit  Medication Sig Dispense Refill  . ferrous sulfate 325 (65 FE) MG tablet Take 325 mg by mouth 3 (three) times daily with meals.      Marland Kitchen. FLUoxetine (PROZAC) 20 MG capsule TAKE ONE CAPSULE BY  MOUTH ONCE DAILY 90 capsule 1  . LORazepam (ATIVAN) 0.5 MG tablet TAKE ONE TABLET BY MOUTH THREE TIMES DAILY AS NEEDED FOR PAIN ANXIETY 90 tablet 2   No current facility-administered medications on file prior to visit.     BP (!) 142/84 (BP Location: Left Arm, Patient Position: Sitting, Cuff Size: Normal)   Temp 98.3 F (36.8 C) (Oral)   Wt 137 lb 3.2 oz (62.2 kg)   SpO2 97%   BMI 21.49 kg/m      Review of Systems  Constitutional: Negative.   HENT: Negative for congestion, dental problem, hearing loss, rhinorrhea, sinus pressure, sore throat and tinnitus.   Eyes: Negative for pain, discharge and visual disturbance.  Respiratory: Negative for cough and shortness of breath.   Cardiovascular: Negative for chest pain, palpitations and leg swelling.  Gastrointestinal: Negative for abdominal distention, abdominal pain, blood in stool, constipation, diarrhea, nausea and vomiting.  Genitourinary: Negative for difficulty urinating, dysuria, flank pain, frequency, hematuria, pelvic pain, urgency, vaginal bleeding, vaginal discharge and vaginal pain.  Musculoskeletal: Negative for arthralgias, gait problem and joint swelling.  Skin: Negative for rash.  Neurological: Negative  for dizziness, syncope, speech difficulty, weakness, numbness and headaches.  Hematological: Negative for adenopathy.  Psychiatric/Behavioral: Positive for dysphoric mood and sleep disturbance. Negative for agitation and behavioral problems. The patient is nervous/anxious.        Objective:   Physical Exam  Constitutional: She is oriented to person, place, and time. She appears well-developed and well-nourished.  Thin anxious Pulse 100 Blood pressure 130/80 Intermittently tearful  HENT:  Head: Normocephalic and atraumatic.  Right Ear: External ear normal.  Left Ear: External ear normal.  Mouth/Throat: Oropharynx is clear and moist.  Eyes: Conjunctivae and EOM are normal.  Neck: Normal range of motion. Neck  supple. No JVD present. No thyromegaly present.  Cardiovascular: Normal rate, regular rhythm, normal heart sounds and intact distal pulses.   No murmur heard. Resting tachycardia  Pulmonary/Chest: Effort normal and breath sounds normal. She has no wheezes. She has no rales.  Abdominal: Soft. Bowel sounds are normal. She exhibits no distension and no mass. There is no tenderness. There is no rebound and no guarding.  Genitourinary: Vagina normal.  Musculoskeletal: Normal range of motion. She exhibits no edema or tenderness.  Neurological: She is alert and oriented to person, place, and time. She has normal reflexes. No cranial nerve deficit. She exhibits normal muscle tone. Coordination normal.  Skin: Skin is warm and dry. No rash noted.  Psychiatric: She has a normal mood and affect. Her behavior is normal.          Assessment & Plan:   Anxiety disorder with depressed mood.  Will increase lorazepam to 1 mg twice a day Behavioral health referral.  Discussed and encouraged.  Contact information dispensed  Check screening lab  Follow-up 3 months

## 2016-07-13 NOTE — Patient Instructions (Signed)
Please call for consultation with behavioral medicine at [phone] 1 [phone] 4   Return in 3 months for follow-up

## 2016-07-15 ENCOUNTER — Encounter: Payer: Self-pay | Admitting: Internal Medicine

## 2016-07-15 ENCOUNTER — Telehealth: Payer: Self-pay | Admitting: Internal Medicine

## 2016-07-15 MED ORDER — ATORVASTATIN CALCIUM 20 MG PO TABS: 20.0000 mg | ORAL_TABLET | Freq: Every day | ORAL | 1 refills | Status: DC

## 2016-07-15 NOTE — Telephone Encounter (Signed)
Patient is aware of lab results and Rx sent.

## 2016-07-15 NOTE — Telephone Encounter (Signed)
Pt is return Amanda Hall °

## 2016-07-26 ENCOUNTER — Other Ambulatory Visit: Payer: Self-pay | Admitting: Internal Medicine

## 2016-08-08 ENCOUNTER — Encounter: Payer: Self-pay | Admitting: Internal Medicine

## 2016-08-09 ENCOUNTER — Other Ambulatory Visit: Payer: Self-pay | Admitting: Internal Medicine

## 2016-08-14 ENCOUNTER — Other Ambulatory Visit: Payer: Self-pay | Admitting: Internal Medicine

## 2016-08-17 NOTE — Telephone Encounter (Signed)
error 

## 2016-09-14 ENCOUNTER — Other Ambulatory Visit: Payer: Self-pay | Admitting: Internal Medicine

## 2016-09-15 NOTE — Telephone Encounter (Signed)
Refill too soon. May refill medication on or after 09/17/16

## 2016-09-17 ENCOUNTER — Other Ambulatory Visit: Payer: Self-pay | Admitting: Internal Medicine

## 2016-09-17 NOTE — Telephone Encounter (Signed)
Medication was refilled.

## 2016-09-30 ENCOUNTER — Encounter: Payer: Self-pay | Admitting: Internal Medicine

## 2016-11-15 ENCOUNTER — Other Ambulatory Visit: Payer: Self-pay | Admitting: Internal Medicine

## 2016-12-13 ENCOUNTER — Other Ambulatory Visit: Payer: Self-pay | Admitting: Internal Medicine

## 2016-12-14 NOTE — Telephone Encounter (Signed)
May be refilled on 12/16/16.

## 2017-01-11 ENCOUNTER — Other Ambulatory Visit: Payer: Self-pay | Admitting: Internal Medicine

## 2017-02-15 ENCOUNTER — Telehealth: Payer: Self-pay | Admitting: Internal Medicine

## 2017-02-22 NOTE — Telephone Encounter (Signed)
[facility] [address] [facility]

## 2017-02-22 NOTE — Telephone Encounter (Signed)
Caller name: Velna HatchetSheila Relationship to patient: sister Can be reached: [phone] [facility] [address] [facility] [place], [place] - [address] [phone] (Phone) [phone] (Fax)   Reason for call: pt has been w/o lorazepam since 02/19/17 and pharmacy sent refill on 02/15/17. Pharmacy states requesting 3x. Please advise.

## 2017-02-22 NOTE — Telephone Encounter (Signed)
clonazepamrefill Last OV: 07/13/16 Last Refill:01/16/17 [facility] [address] [place]

## 2017-02-22 NOTE — Telephone Encounter (Signed)
[facility] at [phone].

## 2017-02-22 NOTE — Telephone Encounter (Signed)
Sister is out of medication.  Please respond to request.

## 2017-02-25 ENCOUNTER — Other Ambulatory Visit: Payer: Self-pay

## 2017-03-09 ENCOUNTER — Telehealth: Payer: Self-pay | Admitting: Internal Medicine

## 2017-03-09 NOTE — Telephone Encounter (Signed)
Copied from CRM [id number]. Topic: Quick Communication - See Telephone Encounter >> Mar 09, 2017  4:10 PM Eston Mouldavis, Damari Hiltz B wrote: CRM for notification. See Telephone encounter for:  Pt is asking if Dr Kirtland BouchardK can increase mg  of LORazepam 1 MG tablet  for her because she feels like she needs a stronger dosage,  she also  commented on last time she had it refilled-  she had to go 3 days without the medication and she had  high anxiety and couldn't sleep. She is also requesting Dr Charm RingsK's nurse call her back.

## 2017-03-11 NOTE — Telephone Encounter (Signed)
I have not seen t his patient  Forwarding to PCP team  Can be addressed when PCP avaiable

## 2017-03-13 ENCOUNTER — Other Ambulatory Visit: Payer: Self-pay | Admitting: Internal Medicine

## 2017-03-16 NOTE — Telephone Encounter (Signed)
LOV 07/13/2016 with Dr. Amador CunasKwiatkowski / Refill request for Ativan

## 2017-03-16 NOTE — Telephone Encounter (Signed)
Copied from CRM [phone]. Topic: Quick Communication - Rx Refill/Question >> Mar 16, 2017 12:17 PM Raquel SarnaHayes, Teresa G wrote: Lorazepam 1 mg  Needing the refills - pt almost   [facility] [place] [address] [place] [id number] Phone: [phone] Fax: [phone]

## 2017-03-17 NOTE — Telephone Encounter (Signed)
Pt states Amanda Hall called her , but I do not see any message. No refill or any other information.

## 2017-03-17 NOTE — Telephone Encounter (Signed)
Patient called and wanted an increase of Lorazepam 1mg  because she feels the medication isn't working. However, patient didn't to schedule an appointment for follow-up regarding the new prescription requested.  She verbalized understanding. Appointment scheduled no further action needed.

## 2017-03-17 NOTE — Telephone Encounter (Signed)
Left message for patient to return phone call.  

## 2017-03-17 NOTE — Telephone Encounter (Signed)
Spoke with patient no further action needed.

## 2017-03-22 ENCOUNTER — Ambulatory Visit (INDEPENDENT_AMBULATORY_CARE_PROVIDER_SITE_OTHER): Payer: Medicare Other | Admitting: Internal Medicine

## 2017-03-22 ENCOUNTER — Other Ambulatory Visit: Payer: Self-pay | Admitting: Internal Medicine

## 2017-03-22 ENCOUNTER — Encounter: Payer: Self-pay | Admitting: Internal Medicine

## 2017-03-22 VITALS — BP 148/80 | HR 105 | Temp 97.9°F | Wt 138.0 lb

## 2017-03-22 DIAGNOSIS — D500 Iron deficiency anemia secondary to blood loss (chronic): Secondary | ICD-10-CM

## 2017-03-22 DIAGNOSIS — F320 Major depressive disorder, single episode, mild: Secondary | ICD-10-CM | POA: Diagnosis not present

## 2017-03-22 DIAGNOSIS — E7800 Pure hypercholesterolemia, unspecified: Secondary | ICD-10-CM | POA: Diagnosis not present

## 2017-03-22 DIAGNOSIS — Z23 Encounter for immunization: Secondary | ICD-10-CM | POA: Diagnosis not present

## 2017-03-22 LAB — LIPID PANEL
CHOL/HDL RATIO: 4
CHOLESTEROL: 189 mg/dL (ref 0–200)
HDL: 50.6 mg/dL (ref >39.00)
NonHDL: 138.44
Triglycerides: 218 mg/dL — ABNORMAL HIGH (ref 0.0–149.0)
VLDL: 43.6 mg/dL — AB (ref 0.0–40.0)

## 2017-03-22 LAB — CBC WITH DIFFERENTIAL/PLATELET
BASOS PCT: 0.6 % (ref 0.0–3.0)
Basophils Absolute: 0.1 10*3/uL (ref 0.0–0.1)
EOS ABS: 0 10*3/uL (ref 0.0–0.7)
Eosinophils Relative: 0.3 % (ref 0.0–5.0)
HCT: 40.1 % (ref 36.0–46.0)
Hemoglobin: 13.7 g/dL (ref 12.0–15.0)
LYMPHS PCT: 16 % (ref 12.0–46.0)
Lymphs Abs: 1.4 10*3/uL (ref 0.7–4.0)
MCHC: 34 g/dL (ref 30.0–36.0)
MCV: 86.5 fl (ref 78.0–100.0)
MONO ABS: 0.5 10*3/uL (ref 0.1–1.0)
Monocytes Relative: 5.4 % (ref 3.0–12.0)
NEUTROS ABS: 6.6 10*3/uL (ref 1.4–7.7)
NEUTROS PCT: 77.7 % — AB (ref 43.0–77.0)
PLATELETS: 322 10*3/uL (ref 150.0–400.0)
RBC: 4.64 Mil/uL (ref 3.87–5.11)
RDW: 13.5 % (ref 11.5–15.5)
WBC: 8.5 10*3/uL (ref 4.0–10.5)

## 2017-03-22 LAB — LDL CHOLESTEROL, DIRECT: LDL DIRECT: 108 mg/dL

## 2017-03-22 MED ORDER — LORAZEPAM 1 MG PO TABS: 1.0000 mg | ORAL_TABLET | Freq: Two times a day (BID) | ORAL | 3 refills | Status: DC

## 2017-03-22 MED ORDER — LORAZEPAM 1 MG PO TABS: 1.0000 mg | ORAL_TABLET | Freq: Two times a day (BID) | ORAL | 0 refills | Status: DC

## 2017-03-22 NOTE — Telephone Encounter (Signed)
Copied from CRM [phone]. Topic: Quick Communication - Rx Refill/Question >> Mar 22, 2017  4:46 PM Alexander BergeronBarksdale, Harvey B wrote:  Send to wrong pharmacy   Medication:  LORazepam (ATIVAN) 1 MG tablet [[id number]][[id number]]    Has the patient contacted their pharmacy? Yes.     (Agent: If no, request that the patient contact the pharmacy for the refill.)   Preferred Pharmacy (with phone number [facility] street name): [facility] [address]    Agent: Please be advised that RX refills may take up to 3 business days. We ask that you follow-up with your pharmacy.

## 2017-03-22 NOTE — Patient Instructions (Signed)
Lorazepam.  Continued to take twice daily 1 in the morning and a second dose at 2 PM.  May take a third dose 6 hours later if needed.    Continue fluoxetine  Okay to discontinue iron  Follow-up 6 months

## 2017-03-22 NOTE — Progress Notes (Signed)
Subjective:    Patient ID: Amanda Hall, female    DOB: 1950-02-27, 67 y.o.   MRN: [id number]  HPI  67 year old patient who is seen today in follow-up. She has a history of anxiety depression and has been on Prozac as well as lorazepam. She was seen 8 months ago and noted to have significant hypercholesterolemia and now is on atorvastatin 20 mg daily.  She tolerates this medication well  She has a history of anemia which was quite severe requiring hospital admission in 2008.  CBC in the fall was normal.  Her chronic GI blood loss was felt secondary to hemorrhoidal disease  She is asking about a increased dose of her lorazepam.  Presently she is taking this twice daily on awakening in the morning and also late in the evening.  She states that often at 2 PM she feels quite anxious and feels that she needs additional medication  She has had behavioral health consultation about 2 years ago which she states was not very helpful  Past Medical History:  Diagnosis Date  . Allergy   . Anemia   . Depression   . Hemorrhoids      Social History   Socioeconomic History  . Marital status: Divorced    Spouse name: Not on file  . Number of children: Not on file  . Years of education: Not on file  . Highest education level: Not on file  Social Needs  . Financial resource strain: Not on file  . Food insecurity - worry: Not on file  . Food insecurity - inability: Not on file  . Transportation needs - medical: Not on file  . Transportation needs - non-medical: Not on file  Occupational History  . Not on file  Tobacco Use  . Smoking status: Never Smoker  . Smokeless tobacco: Never Used  Substance and Sexual Activity  . Alcohol use: No  . Drug use: No  . Sexual activity: Not on file  Other Topics Concern  . Not on file  Social History Narrative  . Not on file    Past Surgical History:  Procedure Laterality Date  . arm surgery     Left  . TUBAL LIGATION      Family History    Problem Relation Age of Onset  . Stroke Mother   . Hypertension Sister   . Cancer Neg Hx        breat and colon hx  . Diabetes Neg Hx        family    Allergies  Allergen Reactions  . Codeine   . Sulfonamide Derivatives   . Tetanus Toxoid     Current Outpatient Medications on File Prior to Visit  Medication Sig Dispense Refill  . atorvastatin (LIPITOR) 20 MG tablet TAKE 1 TABLET BY MOUTH ONCE DAILY 90 tablet 1  . FLUoxetine (PROZAC) 40 MG capsule Take 1 capsule (40 mg total) by mouth daily. 90 capsule 3  . LORazepam (ATIVAN) 1 MG tablet TAKE 1 TABLET BY MOUTH TWICE DAILY 60 tablet 0  . ferrous sulfate 325 (65 FE) MG tablet Take 325 mg by mouth 3 (three) times daily with meals.       No current facility-administered medications on file prior to visit.     BP (!) 148/80 (BP Location: Left Arm, Patient Position: Sitting, Cuff Size: Normal)   Pulse (!) 105   Temp 97.9 F (36.6 C) (Oral)   Wt 138 lb (62.6 kg)   SpO2 97%  BMI 21.61 kg/m     Review of Systems  Constitutional: Negative.   HENT: Negative for congestion, dental problem, hearing loss, rhinorrhea, sinus pressure, sore throat and tinnitus.   Eyes: Negative for pain, discharge and visual disturbance.  Respiratory: Negative for cough and shortness of breath.   Cardiovascular: Negative for chest pain, palpitations and leg swelling.  Gastrointestinal: Negative for abdominal distention, abdominal pain, blood in stool, constipation, diarrhea, nausea and vomiting.       Rare episodes of bright red rectal bleeding  Genitourinary: Negative for difficulty urinating, dysuria, flank pain, frequency, hematuria, pelvic pain, urgency, vaginal bleeding, vaginal discharge and vaginal pain.  Musculoskeletal: Negative for arthralgias, gait problem and joint swelling.  Skin: Negative for rash.  Neurological: Negative for dizziness, syncope, speech difficulty, weakness, numbness and headaches.  Hematological: Negative for  adenopathy.  Psychiatric/Behavioral: Positive for sleep disturbance. Negative for agitation, behavioral problems and dysphoric mood. The patient is nervous/anxious.        Objective:   Physical Exam  Constitutional: She is oriented to person, place, and time. She appears well-developed and well-nourished.  Slightly anxious Repeat blood pressure 140/80  HENT:  Head: Normocephalic.  Right Ear: External ear normal.  Left Ear: External ear normal.  Mouth/Throat: Oropharynx is clear and moist.  Eyes: Conjunctivae and EOM are normal. Pupils are equal, round, and reactive to light.  Neck: Normal range of motion. Neck supple. No thyromegaly present.  Cardiovascular: Normal rate, regular rhythm, normal heart sounds and intact distal pulses.  Pulmonary/Chest: Effort normal and breath sounds normal.  Abdominal: Soft. Bowel sounds are normal. She exhibits no mass. There is no tenderness.  Musculoskeletal: Normal range of motion.  Lymphadenopathy:    She has no cervical adenopathy.  Neurological: She is alert and oriented to person, place, and time.  Skin: Skin is warm and dry. No rash noted.  Psychiatric: She has a normal mood and affect. Her behavior is normal.          Assessment & Plan:   Anxiety disorder.  We will continue lorazepam twice daily; will continue to take in the morning upon awakening and take a second dose at 2 PM.  Hopefully this will allow better control of her anxiety through the day. Continue with fluoxetine  Hypercholesterolemia.  We will recheck a lipid profile History of anemia stable  Rogelia BogaKWIATKOWSKI,Danella Philson FRANK

## 2017-03-23 ENCOUNTER — Other Ambulatory Visit: Payer: Self-pay | Admitting: Internal Medicine

## 2017-03-23 NOTE — Telephone Encounter (Signed)
Copied from CRM [id number]. Topic: Quick Communication - Rx Refill/Question >> Mar 22, 2017  4:46 PM Alexander BergeronBarksdale, Harvey B wrote: Medication:  LORazepam (ATIVAN) 1 MG tablet [[id number]]    Has the patient contacted their pharmacy? Yes.     (Agent: If no, request that the patient contact the pharmacy for the refill.)   Preferred Pharmacy (with phone number [facility] street [facility]): [facility] [address]    Agent: Please be advised that RX refills may take up to 3 business days. We ask that you follow-up with your pharmacy.

## 2017-03-23 NOTE — Telephone Encounter (Signed)
Called [facility]. They did not receive rx. Rx was printed at OV yesterday. Will call patient to see if she received hard copy rx.

## 2017-03-23 NOTE — Telephone Encounter (Signed)
Pt is calling to get update on medication. Please call [phone] She needs sent to pharm

## 2017-03-23 NOTE — Telephone Encounter (Signed)
Med called in to wrong pharmacy per pt. [facility] [address]  Lorazepam pharmacy change Last OV: 03/22/17 Last Refill:03/22/17  Pharmacy:[facility] [address] PCP: Dr. Amador CunasKwiatkowski

## 2017-03-23 NOTE — Telephone Encounter (Signed)
Called patient, she did not receive hard copy rx in office.  Called [facility] [address] to phone in rx and they stated they had already received the rx.  Spoke w/ Waymon AmatoKendra Johnson, CMA and she had previously called in the rx but had not documented yet.

## 2017-03-23 NOTE — Telephone Encounter (Signed)
[facility] [address] Rock NephewPt [facility] [address]

## 2017-03-23 NOTE — Telephone Encounter (Signed)
Medication was sent to th right pharmacy. Patient did not have refills on her prescriptions. Medication phoned in with accurate refills. No further action needed.

## 2017-03-29 NOTE — Telephone Encounter (Signed)
Suggest to the patient that she may want to try one half of a lorazepam tablet (0.5 mg) 3-4 times daily that may offer more consistent control of her anxiety.  Please give patient contact number for behavioral health referral to help assist with her anxiety

## 2017-03-29 NOTE — Telephone Encounter (Signed)
Pt called and states that she is worried that she will run out of medication due to the frequency that she is using the pills, pt is also trying to see what can be done when she approaches the pharmacist w/ a refill request when its obvious too soon to fill, contact pt to advise

## 2017-03-29 NOTE — Telephone Encounter (Signed)
Spoke to patient and informed her to half her tablets to help spread the tablets out. Also informed patient to call behavioral health if problems seems to get worse.

## 2017-03-29 NOTE — Telephone Encounter (Signed)
Spoke to patient and informed her that Dr.Kwiatkowski only prescribed for her to take the medication 2x a day and only take a 3rd one if she REALLY needs it. The patient claims that she really needs it every day and she is going to run out. I informed patient that if it worth her running out of medication then she has to make that decision but the pharmacy will not refill it early. I informed patient that I will call her back after [facility] it.

## 2017-04-17 ENCOUNTER — Emergency Department (HOSPITAL_COMMUNITY)
Admission: EM | Admit: 2017-04-17 | Discharge: 2017-04-18 | Disposition: A | Discharge status: Home / Self Care | Payer: Medicare Other | Attending: Emergency Medicine | Admitting: Emergency Medicine

## 2017-04-17 ENCOUNTER — Other Ambulatory Visit: Payer: Self-pay

## 2017-04-17 ENCOUNTER — Encounter (HOSPITAL_COMMUNITY): Payer: Self-pay

## 2017-04-17 DIAGNOSIS — K5900 Constipation, unspecified: Secondary | ICD-10-CM | POA: Insufficient documentation

## 2017-04-17 DIAGNOSIS — Z79899 Other long term (current) drug therapy: Secondary | ICD-10-CM | POA: Diagnosis not present

## 2017-04-17 DIAGNOSIS — K641 Second degree hemorrhoids: Secondary | ICD-10-CM | POA: Diagnosis not present

## 2017-04-17 DIAGNOSIS — K625 Hemorrhage of anus and rectum: Secondary | ICD-10-CM | POA: Diagnosis present

## 2017-04-17 NOTE — ED Triage Notes (Signed)
Patient is profoundly tearful in triage and tremulous.

## 2017-04-17 NOTE — ED Triage Notes (Signed)
Patient c/o something, "a hemorrhoid", protruding from her rectum. Has been using Preparation H an suppositories and states that blood is "pouring out on the floor".

## 2017-04-18 ENCOUNTER — Encounter (HOSPITAL_COMMUNITY): Payer: Self-pay | Admitting: Emergency Medicine

## 2017-04-18 ENCOUNTER — Emergency Department (HOSPITAL_COMMUNITY): Payer: Medicare Other

## 2017-04-18 DIAGNOSIS — K5900 Constipation, unspecified: Secondary | ICD-10-CM | POA: Diagnosis not present

## 2017-04-18 LAB — COMPREHENSIVE METABOLIC PANEL
ALBUMIN: 4.1 g/dL (ref 3.5–5.0)
ALK PHOS: 87 U/L (ref 38–126)
ALT: 15 U/L (ref 14–54)
ANION GAP: 11 (ref 5–15)
AST: 23 U/L (ref 15–41)
BILIRUBIN TOTAL: 1.1 mg/dL (ref 0.3–1.2)
BUN: 12 mg/dL (ref 6–20)
CO2: 24 mmol/L (ref 22–32)
Calcium: 9.6 mg/dL (ref 8.9–10.3)
Chloride: 102 mmol/L (ref 101–111)
Creatinine, Ser: 1.16 mg/dL — ABNORMAL HIGH (ref 0.44–1.00)
GFR calc Af Amer: 56 mL/min — ABNORMAL LOW (ref >60)
GFR, EST NON AFRICAN AMERICAN: 48 mL/min — AB (ref >60)
GLUCOSE: 123 mg/dL — AB (ref 65–99)
Potassium: 3.1 mmol/L — ABNORMAL LOW (ref 3.5–5.1)
Sodium: 137 mmol/L (ref 135–145)
TOTAL PROTEIN: 7 g/dL (ref 6.5–8.1)

## 2017-04-18 LAB — CBC
HCT: 39.4 % (ref 36.0–46.0)
HEMOGLOBIN: 13.1 g/dL (ref 12.0–15.0)
MCH: 29.3 pg (ref 26.0–34.0)
MCHC: 33.2 g/dL (ref 30.0–36.0)
MCV: 88.1 fL (ref 78.0–100.0)
Platelets: 282 10*3/uL (ref 150–400)
RBC: 4.47 MIL/uL (ref 3.87–5.11)
RDW: 13.4 % (ref 11.5–15.5)
WBC: 6.3 10*3/uL (ref 4.0–10.5)

## 2017-04-18 MED ORDER — HYDROCORTISONE ACE-PRAMOXINE 1-1 % RE FOAM
1.0000 | Freq: Two times a day (BID) | RECTAL | Status: DC
  Administered 2017-04-18: 1 via RECTAL
  Filled 2017-04-18: qty 10

## 2017-04-18 MED ORDER — HYDROCORTISONE 1 % EX CREA: TOPICAL_CREAM | CUTANEOUS | 0 refills | Status: DC

## 2017-04-18 MED ORDER — LIDOCAINE HCL 2 % EX GEL
1.0000 "application " | Freq: Once | CUTANEOUS | Status: AC
  Administered 2017-04-18: 1 via TOPICAL
  Filled 2017-04-18: qty 20

## 2017-04-18 MED ORDER — POLYETHYLENE GLYCOL 3350 17 G PO PACK: 17.0000 g | PACK | Freq: Two times a day (BID) | ORAL | 0 refills | Status: DC

## 2017-04-18 MED ORDER — LORAZEPAM 0.5 MG PO TABS
0.5000 mg | ORAL_TABLET | Freq: Once | ORAL | Status: AC
  Administered 2017-04-18: 0.5 mg via ORAL
  Filled 2017-04-18: qty 1

## 2017-04-18 NOTE — ED Provider Notes (Signed)
[facility] EMERGENCY DEPARTMENT Provider Note   CSN: [id number] Arrival date & time: 04/17/17  2329     History   Chief Complaint Chief Complaint  Patient presents with  . Rectal Bleeding    HPI Amanda Hall is a 67 y.o. female.  The history is provided by the patient.  Rectal Bleeding  Quality:  Bright red Amount:  Scant Chronicity:  New Context: constipation, defecation, hemorrhoids and rectal pain   Pain details:    Quality:  Sharp and throbbing   Severity:  Severe   Duration:  2 days   Timing:  Constant   Progression:  Unchanged Similar prior episodes: yes   Relieved by:  Hemorrhoid cream Worsened by:  Wiping Ineffective treatments:  Hemorrhoid cream Associated symptoms: no abdominal pain, no dizziness, no epistaxis, no fever, no hematemesis and no light-headedness   Risk factors: no anticoagulant use   Was unable to push hemorrhoid back in.  Is straining to have BM.  Now blood on the tissue this evening and patient became panicked and is still panicking now.    Past Medical History:  Diagnosis Date  . Allergy   . Anemia   . Depression   . Hemorrhoids     Patient Active Problem List   Diagnosis Date Noted  . Hypercholesterolemia 03/22/2017  . Nonspecific (abnormal) findings on radiological and other examination of body structure 12/26/2006  . NONSPECIFIC ABNORM FIND RAD&OTH EXAM LUNG FIELD 12/26/2006  . ANEMIA-IRON DEFICIENCY 12/15/2006  . Depression 12/15/2006  . Allergic rhinitis 12/15/2006    Past Surgical History:  Procedure Laterality Date  . arm surgery     Left  . TUBAL LIGATION       OB History   None      Home Medications    Prior to Admission medications   Medication Sig Start Date End Date Taking? Authorizing Provider  atorvastatin (LIPITOR) 20 MG tablet TAKE 1 TABLET BY MOUTH ONCE DAILY 01/12/17   [name], MD  ferrous sulfate 325 (65 FE) MG tablet Take 325 mg by mouth 3 (three) times daily with  meals.      [provider]  FLUoxetine (PROZAC) 40 MG capsule Take 1 capsule (40 mg total) by mouth daily. 07/13/16   [name], MD  LORazepam (ATIVAN) 1 MG tablet Take 1 tablet (1 mg total) by mouth 2 (two) times daily. 03/22/17   [name], MD    Family History Family History  Problem Relation Age of Onset  . Stroke Mother   . Hypertension Sister   . Cancer Neg Hx        breat and colon hx  . Diabetes Neg Hx        family    Social History Social History   Tobacco Use  . Smoking status: Never Smoker  . Smokeless tobacco: Never Used  Substance Use Topics  . Alcohol use: No  . Drug use: No     Allergies   Codeine; Sulfonamide derivatives; and Tetanus toxoid   Review of Systems Review of Systems  Constitutional: Negative for fever.  HENT: Negative for nosebleeds.   Gastrointestinal: Positive for anal bleeding, constipation and hematochezia. Negative for abdominal pain and hematemesis.  Endocrine: Negative for polyphagia.  Neurological: Negative for dizziness and light-headedness.  Psychiatric/Behavioral: The patient is nervous/anxious and is hyperactive.   All other systems reviewed and are negative.    Physical Exam Updated Vital Signs BP (!) 169/83 (BP Location: Right Arm)  Pulse (!) 101   Temp 98.7 F (37.1 C) (Oral)   Resp 18   Ht 5\' 7"  (1.702 m)   Wt 61.2 kg (135 lb)   SpO2 98%   BMI 21.14 kg/m   Physical Exam  Constitutional: She is oriented to person, place, and time. She appears well-developed and well-nourished. No distress.  HENT:  Head: Normocephalic and atraumatic.  Mouth/Throat: No oropharyngeal exudate.  Eyes: Pupils are equal, round, and reactive to light. Conjunctivae are normal.  Neck: Normal range of motion. Neck supple.  Cardiovascular: Normal rate, regular rhythm, normal heart sounds and intact distal pulses.  Pulmonary/Chest: Effort normal and breath sounds normal. No stridor. She has no wheezes. She  has no rales.  Abdominal: Soft. Bowel sounds are normal. She exhibits no mass. There is no tenderness. There is no rebound and no guarding.  Genitourinary:  Genitourinary Comments: Enlarged hemorrhoid, with excoriation.  Scant ooze of blood  Musculoskeletal: Normal range of motion.  Neurological: She is alert and oriented to person, place, and time. She displays normal reflexes.  Skin: Skin is warm. Capillary refill takes less than 2 seconds.  Psychiatric: She has a normal mood and affect.     ED Treatments / Results  Labs (all labs ordered are listed, but only abnormal results are displayed) Results for orders placed or performed during the hospital encounter of 04/17/17  Comprehensive metabolic panel  Result Value Ref Range   Sodium 137 135 - 145 mmol/L   Potassium 3.1 (L) 3.5 - 5.1 mmol/L   Chloride 102 101 - 111 mmol/L   CO2 24 22 - 32 mmol/L   Glucose, Bld 123 (H) 65 - 99 mg/dL   BUN 12 6 - 20 mg/dL   Creatinine, Ser 2.95 (H) 0.44 - 1.00 mg/dL   Calcium 9.6 8.9 - 62.1 mg/dL   Total Protein 7.0 6.5 - 8.1 g/dL   Albumin 4.1 3.5 - 5.0 g/dL   AST 23 15 - 41 U/L   ALT 15 14 - 54 U/L   Alkaline Phosphatase 87 38 - 126 U/L   Total Bilirubin 1.1 0.3 - 1.2 mg/dL   GFR calc non Af Amer 48 (L) >60 mL/min   GFR calc Af Amer 56 (L) >60 mL/min   Anion gap 11 5 - 15  CBC  Result Value Ref Range   WBC 6.3 4.0 - 10.5 K/uL   RBC 4.47 3.87 - 5.11 MIL/uL   Hemoglobin 13.1 12.0 - 15.0 g/dL   HCT 30.8 65.7 - 84.6 %   MCV 88.1 78.0 - 100.0 fL   MCH 29.3 26.0 - 34.0 pg   MCHC 33.2 30.0 - 36.0 g/dL   RDW 96.2 95.2 - 84.1 %   Platelets 282 150 - 400 K/uL   No results found.  EKG None  Radiology No results found.  Procedures Procedures (including critical care time)  Medications Ordered in ED Medications  hydrocortisone-pramoxine (PROCTOFOAM-HC) rectal foam 1 applicator (has no administration in time range)  lidocaine (XYLOCAINE) 2 % jelly 1 application (has no administration in  time range)  LORazepam (ATIVAN) tablet 0.5 mg (has no administration in time range)       Final Clinical Impressions(s) / ED Diagnoses   Explained at length to the patient that surgery does not do emergent surgery.  No bleeding of the hemorrhoids in the ED.  Well appearing now feeling better.  Will start stool softener and meds and refer to surgery as an outpatient.    Return for  weakness, numbness, changes in vision or speech, fevers >100.4 unrelieved by medication, shortness of breath, intractable vomiting, or diarrhea, abdominal pain, Inability to tolerate liquids or food, cough, altered mental status or any concerns. No signs of systemic illness or infection. The patient is nontoxic-appearing on exam and vital signs are within normal limits.   I have reviewed the triage vital signs and the nursing notes. Pertinent labs &imaging results that were available during my care of the patient were reviewed by me and considered in my medical decision making (see chart for details).  After history, exam, and medical workup I feel the patient has been appropriately medically screened and is safe for discharge home. Pertinent diagnoses were discussed with the patient. Patient was given return precautions.    Ameyah Bangura, MD 04/18/17 [id number]

## 2017-04-18 NOTE — ED Notes (Signed)
EDP at bedside  

## 2017-04-18 NOTE — ED Notes (Signed)
PT states understanding of care given, follow up care, and medication prescribed. PT ambulated from ED to car with a steady gait. 

## 2017-04-27 DIAGNOSIS — K642 Third degree hemorrhoids: Secondary | ICD-10-CM | POA: Diagnosis not present

## 2017-04-27 DIAGNOSIS — K648 Other hemorrhoids: Secondary | ICD-10-CM | POA: Diagnosis not present

## 2017-04-27 DIAGNOSIS — K641 Second degree hemorrhoids: Secondary | ICD-10-CM | POA: Diagnosis not present

## 2017-04-27 DIAGNOSIS — L290 Pruritus ani: Secondary | ICD-10-CM | POA: Diagnosis not present

## 2017-04-27 DIAGNOSIS — K5909 Other constipation: Secondary | ICD-10-CM | POA: Diagnosis not present

## 2017-05-10 ENCOUNTER — Other Ambulatory Visit: Payer: Self-pay

## 2017-05-10 ENCOUNTER — Emergency Department (HOSPITAL_COMMUNITY)
Admission: EM | Admit: 2017-05-10 | Discharge: 2017-05-10 | Disposition: A | Discharge status: Home / Self Care | Payer: Medicare Other | Attending: Emergency Medicine | Admitting: Emergency Medicine

## 2017-05-10 ENCOUNTER — Emergency Department (HOSPITAL_COMMUNITY): Payer: Medicare Other

## 2017-05-10 ENCOUNTER — Encounter (HOSPITAL_COMMUNITY): Payer: Self-pay | Admitting: Emergency Medicine

## 2017-05-10 DIAGNOSIS — F419 Anxiety disorder, unspecified: Secondary | ICD-10-CM | POA: Diagnosis not present

## 2017-05-10 DIAGNOSIS — K641 Second degree hemorrhoids: Secondary | ICD-10-CM | POA: Diagnosis not present

## 2017-05-10 DIAGNOSIS — Z79899 Other long term (current) drug therapy: Secondary | ICD-10-CM | POA: Insufficient documentation

## 2017-05-10 DIAGNOSIS — R197 Diarrhea, unspecified: Secondary | ICD-10-CM | POA: Diagnosis not present

## 2017-05-10 DIAGNOSIS — R079 Chest pain, unspecified: Secondary | ICD-10-CM | POA: Diagnosis not present

## 2017-05-10 DIAGNOSIS — F410 Panic disorder [episodic paroxysmal anxiety] without agoraphobia: Secondary | ICD-10-CM | POA: Diagnosis not present

## 2017-05-10 DIAGNOSIS — K649 Unspecified hemorrhoids: Secondary | ICD-10-CM | POA: Diagnosis not present

## 2017-05-10 LAB — URINALYSIS, ROUTINE W REFLEX MICROSCOPIC
Bilirubin Urine: NEGATIVE
GLUCOSE, UA: NEGATIVE mg/dL
HGB URINE DIPSTICK: NEGATIVE
KETONES UR: 20 mg/dL — AB
Leukocytes, UA: NEGATIVE
Nitrite: NEGATIVE
PROTEIN: NEGATIVE mg/dL
Specific Gravity, Urine: 1.018 (ref 1.005–1.030)
pH: 7 (ref 5.0–8.0)

## 2017-05-10 LAB — COMPREHENSIVE METABOLIC PANEL
ALT: 17 U/L (ref 14–54)
AST: 31 U/L (ref 15–41)
Albumin: 4.2 g/dL (ref 3.5–5.0)
Alkaline Phosphatase: 80 U/L (ref 38–126)
Anion gap: 11 (ref 5–15)
BUN: 24 mg/dL — AB (ref 6–20)
CO2: 27 mmol/L (ref 22–32)
CREATININE: 1.36 mg/dL — AB (ref 0.44–1.00)
Calcium: 10.3 mg/dL (ref 8.9–10.3)
Chloride: 102 mmol/L (ref 101–111)
GFR calc Af Amer: 46 mL/min — ABNORMAL LOW (ref >60)
GFR calc non Af Amer: 40 mL/min — ABNORMAL LOW (ref >60)
GLUCOSE: 130 mg/dL — AB (ref 65–99)
Potassium: 2.9 mmol/L — ABNORMAL LOW (ref 3.5–5.1)
SODIUM: 140 mmol/L (ref 135–145)
Total Bilirubin: 1.1 mg/dL (ref 0.3–1.2)
Total Protein: 6.9 g/dL (ref 6.5–8.1)

## 2017-05-10 LAB — CBC
HCT: 37 % (ref 36.0–46.0)
Hemoglobin: 12.3 g/dL (ref 12.0–15.0)
MCH: 29 pg (ref 26.0–34.0)
MCHC: 33.2 g/dL (ref 30.0–36.0)
MCV: 87.3 fL (ref 78.0–100.0)
PLATELETS: 303 10*3/uL (ref 150–400)
RBC: 4.24 MIL/uL (ref 3.87–5.11)
RDW: 13.4 % (ref 11.5–15.5)
WBC: 5.8 10*3/uL (ref 4.0–10.5)

## 2017-05-10 LAB — MAGNESIUM: Magnesium: 2.1 mg/dL (ref 1.7–2.4)

## 2017-05-10 LAB — LIPASE, BLOOD: Lipase: 44 U/L (ref 11–51)

## 2017-05-10 MED ORDER — FLUOXETINE HCL 40 MG PO CAPS: 40.0000 mg | ORAL_CAPSULE | Freq: Every day | ORAL | Status: DC

## 2017-05-10 MED ORDER — ZOLPIDEM TARTRATE 5 MG PO TABS: 5.0000 mg | ORAL_TABLET | Freq: Every evening | ORAL | Status: DC | PRN

## 2017-05-10 MED ORDER — ATORVASTATIN CALCIUM 20 MG PO TABS: 20.0000 mg | ORAL_TABLET | Freq: Every day | ORAL | Status: DC

## 2017-05-10 MED ORDER — LORAZEPAM 1 MG PO TABS: 1.0000 mg | ORAL_TABLET | ORAL | Status: DC | PRN

## 2017-05-10 MED ORDER — ZIPRASIDONE MESYLATE 20 MG IM SOLR: 20.0000 mg | INTRAMUSCULAR | Status: DC | PRN

## 2017-05-10 MED ORDER — MIDAZOLAM HCL 2 MG/2ML IJ SOLN
2.0000 mg | Freq: Once | INTRAMUSCULAR | Status: AC
  Administered 2017-05-10: 2 mg via INTRAVENOUS
  Filled 2017-05-10: qty 2

## 2017-05-10 MED ORDER — LORAZEPAM 1 MG PO TABS
1.0000 mg | ORAL_TABLET | Freq: Two times a day (BID) | ORAL | Status: DC
  Filled 2017-05-10: qty 1

## 2017-05-10 MED ORDER — ACETAMINOPHEN 325 MG PO TABS: 650.0000 mg | ORAL_TABLET | ORAL | Status: DC | PRN

## 2017-05-10 MED ORDER — POTASSIUM CHLORIDE CRYS ER 20 MEQ PO TBCR
40.0000 meq | EXTENDED_RELEASE_TABLET | Freq: Once | ORAL | Status: DC
  Filled 2017-05-10: qty 2

## 2017-05-10 MED ORDER — POTASSIUM CHLORIDE CRYS ER 20 MEQ PO TBCR
40.0000 meq | EXTENDED_RELEASE_TABLET | Freq: Once | ORAL | Status: AC
  Administered 2017-05-10: 40 meq via ORAL
  Filled 2017-05-10: qty 2

## 2017-05-10 MED ORDER — RISPERIDONE 2 MG PO TBDP
2.0000 mg | ORAL_TABLET | Freq: Three times a day (TID) | ORAL | Status: DC | PRN
Start: 2017-05-10 — End: 2017-05-10

## 2017-05-10 NOTE — ED Notes (Signed)
IV cathter removed and intact

## 2017-05-10 NOTE — ED Provider Notes (Addendum)
[facility] EMERGENCY DEPARTMENT Provider Note   CSN: [id number] Arrival date & time: 05/10/17  0446     History   Chief Complaint Chief Complaint  Patient presents with  . Anxiety    HPI Amanda Hall is a 67 y.o. female.  HPI Pt is a poor historian.  67 y/o F comes in with cc of chest pain, dib. Pt states that she had a loose BM earlier today following which patient had an episode of chest heaviness, dib and L arm tingling.   Pt reports having loose BM for 1 day now.  Patient has had more than 10 loose-watery bowel movements that are nonbloody.  Patient denies any recent travel history or antibiotic use.  She denies any associated abdominal pain, fevers.  Pt has no hx of CAD. She reports that she has had similar symptoms in the past when she had panic attack.  At the moment patient does not have any chest pain or shortness of breath.  Pt also reports recent hemorrhoid problem that was banded by General Surgery recently.  She is concerned that with the diarrhea she is having she is going to end up injuring her hemorrhoidal surgery site.  Past Medical History:  Diagnosis Date  . Allergy   . Anemia   . Depression   . Hemorrhoids     Patient Active Problem List   Diagnosis Date Noted  . Hypercholesterolemia 03/22/2017  . Nonspecific (abnormal) findings on radiological and other examination of body structure 12/26/2006  . NONSPECIFIC ABNORM FIND RAD&OTH EXAM LUNG FIELD 12/26/2006  . ANEMIA-IRON DEFICIENCY 12/15/2006  . Depression 12/15/2006  . Allergic rhinitis 12/15/2006    Past Surgical History:  Procedure Laterality Date  . arm surgery     Left  . TUBAL LIGATION       OB History   None      Home Medications    Prior to Admission medications   Medication Sig Start Date End Date Taking? Authorizing Provider  atorvastatin (LIPITOR) 20 MG tablet TAKE 1 TABLET BY MOUTH ONCE DAILY 01/12/17   [name], MD  FLUoxetine (PROZAC) 40 MG  capsule Take 1 capsule (40 mg total) by mouth daily. 07/13/16   [name], MD  hydrocortisone cream 1 % Apply to affected area 2 times daily 04/18/17   Palumbo, April, MD  LORazepam (ATIVAN) 1 MG tablet Take 1 tablet (1 mg total) by mouth 2 (two) times daily. 03/22/17   [name], MD  polyethylene glycol [facility]) packet Take 17 g by mouth 2 (two) times daily. 04/18/17   Palumbo, April, MD  shark liver oil-cocoa butter (PREPARATION H) 0.25-3-85.5 % suppository Place 1 suppository rectally as needed for hemorrhoids.    [provider]    Family History Family History  Problem Relation Age of Onset  . Stroke Mother   . Hypertension Sister   . Cancer Neg Hx        breat and colon hx  . Diabetes Neg Hx        family    Social History Social History   Tobacco Use  . Smoking status: Never Smoker  . Smokeless tobacco: Never Used  Substance Use Topics  . Alcohol use: No  . Drug use: No     Allergies   Codeine; Sulfonamide derivatives; and Tetanus toxoid   Review of Systems Review of Systems  Unable to perform ROS: Psychiatric disorder  Constitutional: Positive for activity change.  Respiratory: Positive for  shortness of breath.   Cardiovascular: Positive for chest pain.  Gastrointestinal: Positive for diarrhea.  Psychiatric/Behavioral: Positive for sleep disturbance. The patient is nervous/anxious.      Physical Exam Updated Vital Signs BP (!) 146/70 (BP Location: Left Arm)   Pulse 90   Temp 98.6 F (37 C) (Oral)   Resp 18   SpO2 98%   Physical Exam  Constitutional: She is oriented to person, place, and time. She appears well-developed.  HENT:  Head: Normocephalic and atraumatic.  Eyes: Pupils are equal, round, and reactive to light. EOM are normal.  Neck: Neck supple.  Cardiovascular: Normal rate.  Pulmonary/Chest: Effort normal.  Abdominal: Soft. Bowel sounds are normal. There is no tenderness.  Neurological: She is alert and oriented  to person, place, and time.  Skin: Skin is warm and dry.  Psychiatric:  Patient is labile emotionally.  She is tearful and unable to express her thoughts properly.  She also has tangential thinking and requires redirection.  Nursing note and vitals reviewed.    ED Treatments / Results  Labs (all labs ordered are listed, but only abnormal results are displayed) Labs Reviewed  COMPREHENSIVE METABOLIC PANEL - Abnormal; Notable for the following components:      Result Value   Potassium 2.9 (*)    Glucose, Bld 130 (*)    BUN 24 (*)    Creatinine, Ser 1.36 (*)    GFR calc non Af Amer 40 (*)    GFR calc Af Amer 46 (*)    All other components within normal limits  URINALYSIS, ROUTINE W REFLEX MICROSCOPIC - Abnormal; Notable for the following components:   APPearance HAZY (*)    Ketones, ur 20 (*)    All other components within normal limits  LIPASE, BLOOD  CBC  MAGNESIUM    EKG EKG Interpretation  Date/Time:  Tuesday May 10 2017 05:23:26 EDT Ventricular Rate:  93 PR Interval:  152 QRS Duration: 90 QT Interval:  404 QTC Calculation: 502 R Axis:   57 Text Interpretation:  Normal sinus rhythm Possible Left atrial enlargement Left ventricular hypertrophy Prolonged QT Abnormal ECG No acute changes besides prolonged QTc Confirmed by Derwood Kaplan [phone]) on 05/10/2017 8:11:44 AM   Radiology Dg Chest 2 View  Result Date: 05/10/2017 CLINICAL DATA:  Chest pain EXAM: CHEST - 2 VIEW COMPARISON:  04/18/2017 AP view FINDINGS: Large lung volumes, but no definite hyperinflation considering the diaphragm shape. Artifact from EKG pads. There is no edema, consolidation, effusion, or pneumothorax. Normal heart size and mediastinal contours. IMPRESSION: No evidence of active disease. Electronically Signed   By: Marnee Spring M.D.   On: 05/10/2017 08:55    Procedures Procedures (including critical care time)  Medications Ordered in ED Medications  potassium chloride SA (K-DUR,KLOR-CON)  CR tablet 40 mEq (has no administration in time range)  zolpidem (AMBIEN) tablet 5 mg (has no administration in time range)  acetaminophen (TYLENOL) tablet 650 mg (has no administration in time range)  risperiDONE (RISPERDAL M-TABS) disintegrating tablet 2 mg (has no administration in time range)    And  LORazepam (ATIVAN) tablet 1 mg (has no administration in time range)    And  ziprasidone (GEODON) injection 20 mg (has no administration in time range)  FLUoxetine (PROZAC) capsule 40 mg (has no administration in time range)  LORazepam (ATIVAN) tablet 1 mg (has no administration in time range)  atorvastatin (LIPITOR) tablet 20 mg (has no administration in time range)  potassium chloride SA (K-DUR,KLOR-CON) CR tablet 40  mEq (40 mEq Oral Given 05/10/17 1112)  midazolam (VERSED) injection 2 mg (2 mg Intravenous Given 05/10/17 1111)     Initial Impression / Assessment and Plan / ED Course  I have reviewed the triage vital signs and the nursing notes.  Pertinent labs & imaging results that were available during my care of the patient were reviewed by me and considered in my medical decision making (see chart for details).  Clinical Course as of May 11 1238  Tue May 10, 2017  1106 Patient continues to be tearful. It appears that patient is psychiatrically decompensated right now, and discharging her without getting psychiatric input might make her symptoms worse. We will consult TTS -to see if patient needs admission or medication change.   Patient is medically cleared at this time.   [AN]  1238 Patient has refused TTS evaluation.  She states that she has dogs at her place that she needs to attend to.  Patient is not decompensated to the point where she needs to be involuntarily committed.  Patient has no SI-HI, and she has a PCP appointment tomorrow.  We will discharge her.  I also examined patient's rectum.  She does have hemorrhoid but there is no evidence of thrombosis or infection.   There is some erythema surrounding the perirectal site, likely due to her diarrhea and frequent wiping. I have advised patient to follow-up with her surgeon.   [AN]    Clinical Course User Index [AN] Derwood Kaplan, MD    Pt comes in with multiple complains. Pt's main complain is anxiety/panic attack.  Patient has history of anxiety and she takes Zoloft and lorazepam.  She reports that she has been unable to sleep the last 2 days because of her anxiety.  This morning she had severe anxiety attack after a bowel movement which led her to having a panic attack with chest pain, shortness of breath and numbness-tingling.  Patient is a 76 and history of hyperlipidemia.  EKG is normal and the initial troponin is normal as well.  My suspicion for cardiac etiology for her chest pain is way lower than this being physical manifestation of her psychiatric condition.  We will continue to monitor the patient.  Her chest pain-shortness of breath has now resolved.  Finally patient is also reporting that she has had loose bowel movements over the past 24 hours.  Patient has no travel history, bloody stools or fevers and she denies any recent antibiotic usage.  She has no abdominal tenderness on my exam.  Electrolytes do show hypo-kalemia, we will replace potassium orally.  Magnesium was normal.  Patient does not truly carry any C. difficile risk factors, so in light of normal white count we will not be starting the stool and presume that she is having a viral process.   Final Clinical Impressions(s) / ED Diagnoses   Final diagnoses:  Anxiety attack  Panic attack  Diarrhea, unspecified type  Grade II hemorrhoids    ED Discharge Orders    None       Derwood Kaplan, MD 05/10/17 1107    Derwood Kaplan, MD 05/10/17 1240

## 2017-05-10 NOTE — ED Triage Notes (Signed)
Pt transported from home by EMS with c/o fear of multiple episodes of diarrhea and fear she may not be able to clean her bottom appropriately.  Per EMS pt was hyperventilating, extreme anxiety, c/o tingling to fingers initially.  IV est Versed 2.5mg  given by EMS, pt continues to appear very anxious but is able to converse upon arrival.

## 2017-05-10 NOTE — Discharge Instructions (Addendum)
We saw you in the ER for what appears to be an anxiety or panic attack.  You are also concerned about your hemorrhoids.  Emergency work-up has been normal.  No evidence of heart attack and all the labs are reassuring, and there is no evidence of urinary infection or infection around the hemorrhoid site.  We had wanted you to be seen by psychiatry while you are still here, however you would prefer being seen as an outpatient..  Please see your primary care doctor tomorrow as planned.  Please see the general surgeons if you have any concerns about your hemorrhoid. Return to the ER immediately if your symptoms get worse.

## 2017-05-11 ENCOUNTER — Ambulatory Visit: Payer: Self-pay | Admitting: Family Medicine

## 2017-05-17 ENCOUNTER — Ambulatory Visit: Payer: Self-pay | Admitting: Family Medicine

## 2017-05-18 ENCOUNTER — Ambulatory Visit: Payer: Self-pay | Admitting: Family Medicine

## 2017-05-18 ENCOUNTER — Encounter (HOSPITAL_COMMUNITY): Payer: Self-pay

## 2017-05-18 ENCOUNTER — Encounter: Payer: Self-pay | Admitting: Family Medicine

## 2017-05-18 ENCOUNTER — Emergency Department (HOSPITAL_COMMUNITY)
Admission: EM | Admit: 2017-05-18 | Discharge: 2017-05-18 | Disposition: A | Discharge status: Home / Self Care | Payer: Medicare Other | Attending: Emergency Medicine | Admitting: Emergency Medicine

## 2017-05-18 ENCOUNTER — Other Ambulatory Visit: Payer: Self-pay

## 2017-05-18 DIAGNOSIS — Z046 Encounter for general psychiatric examination, requested by authority: Secondary | ICD-10-CM | POA: Insufficient documentation

## 2017-05-18 DIAGNOSIS — Z79899 Other long term (current) drug therapy: Secondary | ICD-10-CM | POA: Diagnosis not present

## 2017-05-18 DIAGNOSIS — F32A Depression, unspecified: Secondary | ICD-10-CM | POA: Diagnosis present

## 2017-05-18 DIAGNOSIS — F419 Anxiety disorder, unspecified: Secondary | ICD-10-CM | POA: Diagnosis not present

## 2017-05-18 DIAGNOSIS — F329 Major depressive disorder, single episode, unspecified: Secondary | ICD-10-CM | POA: Diagnosis not present

## 2017-05-18 DIAGNOSIS — F29 Unspecified psychosis not due to a substance or known physiological condition: Secondary | ICD-10-CM | POA: Diagnosis not present

## 2017-05-18 HISTORY — DX: Anxiety disorder, unspecified: F41.9

## 2017-05-18 LAB — CBC WITH DIFFERENTIAL/PLATELET
BASOS PCT: 1 %
Basophils Absolute: 0 10*3/uL (ref 0.0–0.1)
Eosinophils Absolute: 0 10*3/uL (ref 0.0–0.7)
Eosinophils Relative: 0 %
HCT: 38.7 % (ref 36.0–46.0)
Hemoglobin: 13.2 g/dL (ref 12.0–15.0)
Lymphocytes Relative: 20 %
Lymphs Abs: 1.3 10*3/uL (ref 0.7–4.0)
MCH: 29.6 pg (ref 26.0–34.0)
MCHC: 34.1 g/dL (ref 30.0–36.0)
MCV: 86.8 fL (ref 78.0–100.0)
MONOS PCT: 8 %
Monocytes Absolute: 0.5 10*3/uL (ref 0.1–1.0)
NEUTROS ABS: 4.7 10*3/uL (ref 1.7–7.7)
Neutrophils Relative %: 71 %
Platelets: 302 10*3/uL (ref 150–400)
RBC: 4.46 MIL/uL (ref 3.87–5.11)
RDW: 13.3 % (ref 11.5–15.5)
WBC: 6.5 10*3/uL (ref 4.0–10.5)

## 2017-05-18 LAB — COMPREHENSIVE METABOLIC PANEL
ALT: 18 U/L (ref 14–54)
AST: 28 U/L (ref 15–41)
Albumin: 4.6 g/dL (ref 3.5–5.0)
Alkaline Phosphatase: 79 U/L (ref 38–126)
Anion gap: 14 (ref 5–15)
BUN: 26 mg/dL — ABNORMAL HIGH (ref 6–20)
CO2: 23 mmol/L (ref 22–32)
Calcium: 10.2 mg/dL (ref 8.9–10.3)
Chloride: 102 mmol/L (ref 101–111)
Creatinine, Ser: 1.2 mg/dL — ABNORMAL HIGH (ref 0.44–1.00)
GFR calc Af Amer: 54 mL/min — ABNORMAL LOW (ref >60)
GFR calc non Af Amer: 46 mL/min — ABNORMAL LOW (ref >60)
Glucose, Bld: 124 mg/dL — ABNORMAL HIGH (ref 65–99)
Potassium: 3.2 mmol/L — ABNORMAL LOW (ref 3.5–5.1)
Sodium: 139 mmol/L (ref 135–145)
Total Bilirubin: 1.1 mg/dL (ref 0.3–1.2)
Total Protein: 8 g/dL (ref 6.5–8.1)

## 2017-05-18 LAB — RAPID URINE DRUG SCREEN, HOSP PERFORMED
Amphetamines: NOT DETECTED
Barbiturates: NOT DETECTED
Benzodiazepines: POSITIVE — AB
Cocaine: NOT DETECTED
Opiates: NOT DETECTED
Tetrahydrocannabinol: NOT DETECTED

## 2017-05-18 LAB — ETHANOL: Alcohol, Ethyl (B): <10 mg/dL (ref <10)

## 2017-05-18 MED ORDER — ONDANSETRON HCL 4 MG PO TABS: 4.0000 mg | ORAL_TABLET | Freq: Three times a day (TID) | ORAL | Status: DC | PRN

## 2017-05-18 MED ORDER — FLUOXETINE HCL 20 MG PO CAPS: 20.0000 mg | ORAL_CAPSULE | Freq: Every day | ORAL | 0 refills | Status: DC

## 2017-05-18 MED ORDER — FLUOXETINE HCL 20 MG PO CAPS: 20.0000 mg | ORAL_CAPSULE | Freq: Every day | ORAL | Status: DC

## 2017-05-18 MED ORDER — ACETAMINOPHEN 325 MG PO TABS: 650.0000 mg | ORAL_TABLET | ORAL | Status: DC | PRN

## 2017-05-18 MED ORDER — FLUOXETINE HCL 20 MG PO CAPS
40.0000 mg | ORAL_CAPSULE | Freq: Every day | ORAL | Status: DC
  Filled 2017-05-18: qty 2

## 2017-05-18 MED ORDER — ATORVASTATIN CALCIUM 20 MG PO TABS
20.0000 mg | ORAL_TABLET | Freq: Every day | ORAL | Status: DC
  Filled 2017-05-18: qty 1

## 2017-05-18 MED ORDER — ALUM & MAG HYDROXIDE-SIMETH 200-200-20 MG/5ML PO SUSP: 30.0000 mL | Freq: Four times a day (QID) | ORAL | Status: DC | PRN

## 2017-05-18 MED ORDER — GABAPENTIN 100 MG PO CAPS: 100.0000 mg | ORAL_CAPSULE | Freq: Three times a day (TID) | ORAL | Status: DC

## 2017-05-18 MED ORDER — GABAPENTIN 100 MG PO CAPS: 100.0000 mg | ORAL_CAPSULE | Freq: Three times a day (TID) | ORAL | 0 refills | Status: DC

## 2017-05-18 NOTE — ED Notes (Signed)
Patient with multiple complaints. Patient with hemorrhoid issues, phlegm in her throat, anxiety, and bruising from IV sticks and blood draws. Patient stated she didn't take her ativan before bed because she was scared she was going to run out. Patient has 43 half tablets of ativan.

## 2017-05-18 NOTE — [facility] Suicide Risk Assessment (Signed)
Suicide Risk Assessment  Discharge Assessment   [facility] Discharge Suicide Risk Assessment   Principal Problem: Depression Discharge Diagnoses:  Patient Active Problem List   Diagnosis Date Noted  . Hypercholesterolemia [E78.00] 03/22/2017  . Nonspecific (abnormal) findings on radiological and other examination of body structure [793] 12/26/2006  . NONSPECIFIC ABNORM FIND RAD&OTH EXAM LUNG FIELD [R93.0] 12/26/2006  . ANEMIA-IRON DEFICIENCY [D50.9] 12/15/2006  . Depression [F32.9] 12/15/2006  . Allergic rhinitis [J30.9] 12/15/2006    Total Time spent with patient: 30 minutes  Musculoskeletal: Strength & Muscle Tone: within normal limits Gait & Station: normal Patient leans: N/A  Psychiatric Specialty Exam:   Blood pressure (!) 176/95, pulse (!) 113, temperature 98.5 F (36.9 C), temperature source Oral, resp. rate 20, SpO2 96 %.There is no height or weight on file to calculate BMI.  General Appearance: Casual  Eye Contact::  Good  Speech:  Clear and Coherent409  Volume:  Normal  Mood:  Depressed  Affect:  Appropriate and Congruent  Thought Process:  Coherent, Goal Directed and Linear  Orientation:  Full (Time, Place, and Person)  Thought Content:  Logical  Suicidal Thoughts:  No  Homicidal Thoughts:  No  Memory:  Immediate;   Good Recent;   Good Remote;   Fair  Judgement:  Fair  Insight:  Fair  Psychomotor Activity:  Normal  Concentration:  Good  Recall:  Good  Fund of Knowledge:Good  Language: Good  Akathisia:  No  Handed:  Right  AIMS (if indicated):     Assets:  Architect Housing Social Support  Sleep:     Cognition: WNL  ADL's:  Intact   Mental Status Per Nursing Assessment::   On Admission:    Depressed and with suicidal thoughts  Demographic Factors:  Age 67 or older  Loss Factors: Decline in physical health  Historical Factors: Impulsivity  Risk Reduction Factors:   Sense of responsibility to  family  Continued Clinical Symptoms:  Depression:   Impulsivity  Cognitive Features That Contribute To Risk:  Closed-mindedness    Suicide Risk:  Minimal: No identifiable suicidal ideation.  Patients presenting with no risk factors but with morbid ruminations; may be classified as minimal risk based on the severity of the depressive symptoms    Plan Of Care/Follow-up recommendations:  Activity:  as tolerated Diet:  Heart healthy  Laveda Abbe, NP 05/18/2017, 12:03 PM

## 2017-05-18 NOTE — BH Assessment (Addendum)
Assessment Note  Amanda Hall is a 67 year old divorced female who presents voluntarily to [facility] via EMS. Pt reporting reporting severe symptoms of anxiety, having difficulty speaking in sentences. Pt has a history of anxiety and phobias. She has recently been anxious about the loss of her PCP. This morning she has an appt with the new PCP in that same practice. Pt was awake all night worrying about the appointment and eventually called the service to cx the appt, as she is phobic abot MD appts. Pt then became even more anxious about how she would be able to get her anxiety meds. Pt already has been off of her prozac since Feb 9 since out of rx. Pt denies SI, & HI. She was unable to confirm verbally she had no AVH, but was having a difficult time breathing and speaking. Pt did not appear to be responding to internal stimuli and there was no indication of psychosis. Pt states she has never had a suicide attempt. Pt states she does not want to be admitted at behavioral health. She was prescribed a new anxiety medication (gabapentin) and restarted on prozac.   Diagnosis:  F40.01  panic disorder with agoraphobia  Disposition: Roosvelt Harps, DO recommends Pt to take meds & f/u with new outpt provider when she starts to feel better.    Past Medical History:  Diagnosis Date  . Allergy   . Anemia   . Anxiety   . Depression   . Hemorrhoids     Past Surgical History:  Procedure Laterality Date  . arm surgery     Left  . TUBAL LIGATION      Family History:  Family History  Problem Relation Age of Onset  . Stroke Mother   . Hypertension Sister   . Cancer Neg Hx        breat and colon hx  . Diabetes Neg Hx        family    Social History:  reports that she has never smoked. She has never used smokeless tobacco. She reports that she does not drink alcohol or use drugs.  Additional Social History:  Alcohol / Drug Use Pain Medications: see MAR Prescriptions: see MAR Over the Counter: see  MAR History of alcohol / drug use?: No history of alcohol / drug abuse Longest period of sobriety (when/how long): NA  CIWA: CIWA-Ar BP: (!) 153/96 Pulse Rate: (!) 114 COWS:    Allergies:  Allergies  Allergen Reactions  . Codeine Other (See Comments)    "makes head fell crazy"  . Sulfonamide Derivatives Nausea Only  . Tetanus Toxoid Other (See Comments)    "Made a Knot"    Home Medications:  (Not in a hospital admission)  OB/GYN Status:  No LMP recorded. (Menstrual status: Perimenopausal).  General Assessment Data Location of Assessment: [facility] TTS Assessment: In system Is this a Tele or Face-to-Face Assessment?: Face-to-Face Is this an Initial Assessment or a Re-assessment for this encounter?: Initial Assessment Marital status: Divorced Living Arrangements: Other relatives(sister) Can pt return to current living arrangement?: Yes Admission Status: Voluntary Is patient capable of signing voluntary admission?: Yes Referral Source: Self/Family/Friend Insurance type: medicare     Crisis Care Plan Living Arrangements: Other relatives(sister) Name of Psychiatrist: (none) Name of Therapist: (none)  Education Status Is patient currently in school?: No Is the patient employed, unemployed or receiving disability?: Unemployed  Risk to self with the past 6 months Suicidal Ideation: No Has patient been a risk to self  within the past 6 months prior to admission? : No Suicidal Intent: No Has patient had any suicidal intent within the past 6 months prior to admission? : No Is patient at risk for suicide?: No Suicidal Plan?: No Has patient had any suicidal plan within the past 6 months prior to admission? : No Access to Means: No Previous Attempts/Gestures: No Other Self Harm Risks: no Intentional Self Injurious Behavior: None Family Suicide History: Unable to assess Recent stressful life event(s): Other (Comment)(loss of primar PCP- phobic of MD appts) Persecutory  voices/beliefs?: (UTA- pt anxious, breathless) Depression: Yes Depression Symptoms: Insomnia, Fatigue, Feeling worthless/self pity Substance abuse history and/or treatment for substance abuse?: No Suicide prevention information given to non-admitted patients: Not applicable  Risk to Others within the past 6 months Homicidal Ideation: No Does patient have any lifetime risk of violence toward others beyond the six months prior to admission? : No Thoughts of Harm to Others: No Current Homicidal Intent: No Current Homicidal Plan: No Access to Homicidal Means: No History of harm to others?: No Assessment of Violence: None Noted Does patient have access to weapons?: No Criminal Charges Pending?: No Does patient have a court date: No Is patient on probation?: No  Psychosis Hallucinations: None noted Delusions: None noted  Mental Status Report Appearance/Hygiene: Disheveled Eye Contact: Good Motor Activity: Restlessness Speech: Pressured, Other (Comment)(spoke in half sentences due to severe anxiety) Level of Consciousness: Restless, Alert Mood: Anxious Affect: Anxious Anxiety Level: Severe Thought Processes: Relevant Judgement: Partial Orientation: Person, Place, Time, Situation Obsessive Compulsive Thoughts/Behaviors: Moderate  Cognitive Functioning Concentration: Good Memory: Recent Intact, Remote Intact Is patient IDD: No Impulse Control: Good Appetite: Fair Have you had any weight changes? : Loss Sleep: Decreased  ADLScreening [facility] Assessment Services) Patient's cognitive ability adequate to safely complete daily activities?: Yes Patient able to express need for assistance with ADLs?: Yes Independently performs ADLs?: Yes (appropriate for developmental age)  Prior Inpatient Therapy Prior Inpatient Therapy: No  Prior Outpatient Therapy Prior Outpatient Therapy: No Does patient have an ACCT team?: No Does patient have Intensive In-House Services?  : No Does  patient have [facility] services? : No Does patient have P4CC services?: No  ADL Screening (condition at time of admission) Patient's cognitive ability adequate to safely complete daily activities?: Yes Is the patient deaf or have difficulty hearing?: No Does the patient have difficulty seeing, even when wearing glasses/contacts?: No Does the patient have difficulty concentrating, remembering, or making decisions?: No Patient able to express need for assistance with ADLs?: Yes Does the patient have difficulty dressing or bathing?: Yes Independently performs ADLs?: Yes (appropriate for developmental age) Does the patient have difficulty walking or climbing stairs?: No Weakness of Legs: None Weakness of Arms/Hands: None  Home Assistive Devices/Equipment Home Assistive Devices/Equipment: None  Therapy Consults (therapy consults require a physician order) PT Evaluation Needed: No OT Evalulation Needed: No SLP Evaluation Needed: No Abuse/Neglect Assessment (Assessment to be complete while patient is alone) Abuse/Neglect Assessment Can Be Completed: Unable to assess, patient is non-responsive or altered mental status Values / Beliefs Cultural Requests During Hospitalization: None Spiritual Requests During Hospitalization: None Consults Spiritual Care Consult Needed: No Social Work Consult Needed: No Merchant navy officer (For Healthcare) Does Patient Have a Medical Advance Directive?: No    Additional Information 1:1 In Past 12 Months?: No CIRT Risk: No Elopement Risk: No Does patient have medical clearance?: No     Disposition:  Disposition Initial Assessment Completed for this Encounter: Yes Disposition of Patient: Discharge Patient  refused recommended treatment: No  On Site Evaluation by:   Reviewed with Physician:    Clearnce Sorrel 05/24/2017 12:53 PM

## 2017-05-18 NOTE — ED Notes (Signed)
Pt refusing to take sips of water with pills because afraid she will have to go back to the bathroom.

## 2017-05-18 NOTE — ED Provider Notes (Signed)
[facility]-EMERGENCY DEPT Provider Note   CSN: [id number] Arrival date & time: 05/18/17  0516     History   Chief Complaint Chief Complaint  Patient presents with  . Anxiety    HPI Amanda Hall is a 67 y.o. female.  The history is provided by the patient and the EMS personnel. The history is limited by the condition of the patient (Psychiatric disorder).  Anxiety   She has history of hyperlipidemia, anxiety and depression and is brought in by ambulance because of severe anxiety.  Patient is very distraught and unable to complete sentences.  She just repeatedly tells me that she told ambulance personnel and they were supposed to tell me everything.  EMS reports that family states that she has severe phobias about everything.  She was so anxious that they had to give her 3 doses of medazepam in route for her to tolerate the right in.  She is also very upset because her PCP is retiring and she will need to see a new physician.  She denies hallucinations and denies suicidal ideation and homicidal ideation.  She states she is not under the care of a psychiatrist.  Past Medical History:  Diagnosis Date  . Allergy   . Anemia   . Anxiety   . Depression   . Hemorrhoids     Patient Active Problem List   Diagnosis Date Noted  . Hypercholesterolemia 03/22/2017  . Nonspecific (abnormal) findings on radiological and other examination of body structure 12/26/2006  . NONSPECIFIC ABNORM FIND RAD&OTH EXAM LUNG FIELD 12/26/2006  . ANEMIA-IRON DEFICIENCY 12/15/2006  . Depression 12/15/2006  . Allergic rhinitis 12/15/2006    Past Surgical History:  Procedure Laterality Date  . arm surgery     Left  . TUBAL LIGATION       OB History   None      Home Medications    Prior to Admission medications   Medication Sig Start Date End Date Taking? Authorizing Provider  atorvastatin (LIPITOR) 20 MG tablet TAKE 1 TABLET BY MOUTH ONCE DAILY 01/12/17   [name],  MD  FLUoxetine (PROZAC) 40 MG capsule Take 1 capsule (40 mg total) by mouth daily. 07/13/16   [name], MD  hydrocortisone cream 1 % Apply to affected area 2 times daily 04/18/17   Palumbo, April, MD  LORazepam (ATIVAN) 1 MG tablet Take 1 tablet (1 mg total) by mouth 2 (two) times daily. 03/22/17   [name], MD  polyethylene glycol [facility]) packet Take 17 g by mouth 2 (two) times daily. 04/18/17   Palumbo, April, MD  shark liver oil-cocoa butter (PREPARATION H) 0.25-3-85.5 % suppository Place 1 suppository rectally as needed for hemorrhoids.    [provider]    Family History Family History  Problem Relation Age of Onset  . Stroke Mother   . Hypertension Sister   . Cancer Neg Hx        breat and colon hx  . Diabetes Neg Hx        family    Social History Social History   Tobacco Use  . Smoking status: Never Smoker  . Smokeless tobacco: Never Used  Substance Use Topics  . Alcohol use: No  . Drug use: No     Allergies   Codeine; Sulfonamide derivatives; and Tetanus toxoid   Review of Systems Review of Systems  Unable to perform ROS: Psychiatric disorder     Physical Exam Updated Vital Signs BP 135/73 (  BP Location: Right Arm)   Pulse 94   Temp 98.5 F (36.9 C) (Oral)   Resp (!) 24   SpO2 97%   Physical Exam  Nursing note and vitals reviewed.  67 year old female, anxious and tremulous, but in no acute distress. Vital signs are normal. Oxygen saturation is 97%, which is normal. Head is normocephalic and atraumatic. PERRLA, EOMI. Oropharynx is clear. Neck is nontender and supple without adenopathy or JVD. Back is nontender and there is no CVA tenderness. Lungs are clear without rales, wheezes, or rhonchi. Chest is nontender. Heart has regular rate and rhythm without murmur. Abdomen is soft, flat, nontender without masses or hepatosplenomegaly and peristalsis is normoactive. Extremities have no cyanosis or edema, full range of motion  is present. Skin is warm and dry without rash. Neurologic: Awake and alert, cranial nerves are intact, there are no motor or sensory deficits.  Generally tremulous. Psychiatric: Very anxious with disordered thought processes.  Unable to complete sentences because she loses her train of thought.  She does not appear to be responding to internal stimuli.  ED Treatments / Results  Labs (all labs ordered are listed, but only abnormal results are displayed) Labs Reviewed  CBC WITH DIFFERENTIAL/PLATELET  COMPREHENSIVE METABOLIC PANEL  ETHANOL  RAPID URINE DRUG SCREEN, HOSP PERFORMED    Procedures Procedures (including critical care time)  Medications Ordered in ED Medications  alum & mag hydroxide-simeth (MAALOX/MYLANTA) 200-200-20 MG/5ML suspension 30 mL (has no administration in time range)  ondansetron (ZOFRAN) tablet 4 mg (has no administration in time range)  acetaminophen (TYLENOL) tablet 650 mg (has no administration in time range)  atorvastatin (LIPITOR) tablet 20 mg (has no administration in time range)  FLUoxetine (PROZAC) capsule 40 mg (has no administration in time range)     Initial Impression / Assessment and Plan / ED Course  I have reviewed the triage vital signs and the nursing notes.  Pertinent labs & imaging results that were available during my care of the patient were reviewed by me and considered in my medical decision making (see chart for details).  Severe, debilitating anxiety.  Old records are reviewed, and she had an ED visit April 30 for anxiety.  Her anxiety is so severe that it is interfering with her ability to function at even a very basic level.  I do not see any visits involving behavioral health.  Will check screening labs and will get consultation with TTS.  It is noted that she was hypokalemic at her last ED visit and had mild renal insufficiency.  We will need to check those today.  Case is signed out to Dr. Effie Shy.  Final Clinical Impressions(s) / ED  Diagnoses   Final diagnoses:  Anxiety    ED Discharge Orders    None       Dione Booze, MD 05/18/17 [phone]

## 2017-05-18 NOTE — ED Notes (Signed)
TTS at bedside. 

## 2017-05-18 NOTE — ED Notes (Signed)
Called [facility] pt sister to let her know that pt is up for discharge with 2 new prescriptions.  States that pt's brother will be coming to pick her up.

## 2017-05-18 NOTE — ED Provider Notes (Signed)
11:05 AM-evaluation for medical clearance.  Patient is medically cleared at this time.  Laboratory evaluation reveals mild changes which are chronic.   Mancel Bale, MD 05/18/17 1110

## 2017-05-18 NOTE — BH Assessment (Signed)
[facility] Assessment Progress Note  Per Juanetta Beets, DO, this pt does not require psychiatric hospitalization at this time.  Pt is to be discharged from [facility] with recommendation to follow up with her primary care provider.  This has been included in pt's discharge instructions.  Pt's nurse has been notified.  Doylene Canning, MA Triage Specialist [phone]

## 2017-05-18 NOTE — ED Notes (Signed)
Pt received 7.5mg  versed IV in route per EMS with very little relief

## 2017-05-18 NOTE — ED Notes (Signed)
Bed: RESA Expected date:  Expected time:  Means of arrival:  Comments: 66 yr SOB,anxiety

## 2017-05-18 NOTE — Discharge Instructions (Signed)
For your behavioral health needs, you are advised to follow up with your primary care provider. °

## 2017-05-18 NOTE — ED Triage Notes (Signed)
EMS was called out for  Chest pain which was negative, she has debilitating anxiety and is currently stressing out because her doctor is retiring and is anxious about seeing a new one Pt has phobias about everything per family

## 2017-05-24 DIAGNOSIS — F419 Anxiety disorder, unspecified: Secondary | ICD-10-CM | POA: Diagnosis not present

## 2017-05-24 DIAGNOSIS — R450 Nervousness: Secondary | ICD-10-CM | POA: Diagnosis not present

## 2017-05-24 DIAGNOSIS — F332 Major depressive disorder, recurrent severe without psychotic features: Secondary | ICD-10-CM | POA: Diagnosis not present

## 2017-05-24 DIAGNOSIS — R0602 Shortness of breath: Secondary | ICD-10-CM | POA: Diagnosis not present

## 2017-05-24 DIAGNOSIS — R45851 Suicidal ideations: Secondary | ICD-10-CM | POA: Diagnosis not present

## 2017-05-25 ENCOUNTER — Emergency Department (HOSPITAL_COMMUNITY)
Admission: EM | Admit: 2017-05-25 | Discharge: 2017-05-26 | Disposition: A | Discharge status: Other Acute Inpatient Hospital | Payer: Medicare Other | Attending: Emergency Medicine | Admitting: Emergency Medicine

## 2017-05-25 ENCOUNTER — Encounter (HOSPITAL_COMMUNITY): Payer: Self-pay

## 2017-05-25 ENCOUNTER — Emergency Department (HOSPITAL_COMMUNITY): Payer: Medicare Other

## 2017-05-25 DIAGNOSIS — F410 Panic disorder [episodic paroxysmal anxiety] without agoraphobia: Secondary | ICD-10-CM | POA: Insufficient documentation

## 2017-05-25 DIAGNOSIS — R0602 Shortness of breath: Secondary | ICD-10-CM | POA: Diagnosis not present

## 2017-05-25 DIAGNOSIS — F411 Generalized anxiety disorder: Secondary | ICD-10-CM | POA: Diagnosis present

## 2017-05-25 DIAGNOSIS — F329 Major depressive disorder, single episode, unspecified: Secondary | ICD-10-CM | POA: Diagnosis present

## 2017-05-25 DIAGNOSIS — Z79899 Other long term (current) drug therapy: Secondary | ICD-10-CM | POA: Insufficient documentation

## 2017-05-25 DIAGNOSIS — F419 Anxiety disorder, unspecified: Secondary | ICD-10-CM | POA: Diagnosis not present

## 2017-05-25 DIAGNOSIS — F332 Major depressive disorder, recurrent severe without psychotic features: Secondary | ICD-10-CM | POA: Insufficient documentation

## 2017-05-25 LAB — URINALYSIS, ROUTINE W REFLEX MICROSCOPIC
Bilirubin Urine: NEGATIVE
Glucose, UA: NEGATIVE mg/dL
HGB URINE DIPSTICK: NEGATIVE
KETONES UR: NEGATIVE mg/dL
Leukocytes, UA: NEGATIVE
Nitrite: NEGATIVE
Protein, ur: NEGATIVE mg/dL
Specific Gravity, Urine: 1.009 (ref 1.005–1.030)
pH: 6 (ref 5.0–8.0)

## 2017-05-25 LAB — BASIC METABOLIC PANEL
Anion gap: 13 (ref 5–15)
BUN: 21 mg/dL — AB (ref 6–20)
CO2: 26 mmol/L (ref 22–32)
Calcium: 10 mg/dL (ref 8.9–10.3)
Chloride: 101 mmol/L (ref 101–111)
Creatinine, Ser: 0.96 mg/dL (ref 0.44–1.00)
Glucose, Bld: 129 mg/dL — ABNORMAL HIGH (ref 65–99)
POTASSIUM: 3.2 mmol/L — AB (ref 3.5–5.1)
SODIUM: 140 mmol/L (ref 135–145)

## 2017-05-25 LAB — CBC WITH DIFFERENTIAL/PLATELET
BASOS ABS: 0 10*3/uL (ref 0.0–0.1)
Basophils Relative: 1 %
EOS ABS: 0.1 10*3/uL (ref 0.0–0.7)
EOS PCT: 1 %
HCT: 39.1 % (ref 36.0–46.0)
HEMOGLOBIN: 13.2 g/dL (ref 12.0–15.0)
Lymphocytes Relative: 35 %
Lymphs Abs: 2.1 10*3/uL (ref 0.7–4.0)
MCH: 29.5 pg (ref 26.0–34.0)
MCHC: 33.8 g/dL (ref 30.0–36.0)
MCV: 87.3 fL (ref 78.0–100.0)
Monocytes Absolute: 0.5 10*3/uL (ref 0.1–1.0)
Monocytes Relative: 9 %
NEUTROS PCT: 54 %
Neutro Abs: 3.3 10*3/uL (ref 1.7–7.7)
PLATELETS: 300 10*3/uL (ref 150–400)
RBC: 4.48 MIL/uL (ref 3.87–5.11)
RDW: 13 % (ref 11.5–15.5)
WBC: 6.1 10*3/uL (ref 4.0–10.5)

## 2017-05-25 LAB — RAPID URINE DRUG SCREEN, HOSP PERFORMED
AMPHETAMINES: NOT DETECTED
Barbiturates: NOT DETECTED
Benzodiazepines: POSITIVE — AB
Cocaine: NOT DETECTED
OPIATES: NOT DETECTED
Tetrahydrocannabinol: NOT DETECTED

## 2017-05-25 LAB — ETHANOL: Alcohol, Ethyl (B): <10 mg/dL (ref <10)

## 2017-05-25 MED ORDER — LORAZEPAM 1 MG PO TABS
1.0000 mg | ORAL_TABLET | Freq: Once | ORAL | Status: AC
  Administered 2017-05-25: 1 mg via ORAL
  Filled 2017-05-25: qty 1

## 2017-05-25 MED ORDER — GABAPENTIN 100 MG PO CAPS
100.0000 mg | ORAL_CAPSULE | Freq: Three times a day (TID) | ORAL | Status: DC
  Administered 2017-05-26: 100 mg via ORAL
  Filled 2017-05-25 (×2): qty 1

## 2017-05-25 MED ORDER — MIRTAZAPINE 30 MG PO TABS
15.0000 mg | ORAL_TABLET | Freq: Every day | ORAL | Status: DC
  Filled 2017-05-25: qty 1

## 2017-05-25 NOTE — ED Provider Notes (Signed)
[facility]-EMERGENCY DEPT Provider Note   CSN: [id number] Arrival date & time: 05/25/17  0044     History   Chief Complaint Chief Complaint  Patient presents with  . Anxiety    HPI Dwayne Begay is a 67 y.o. female.  Patient is a 67 year old female with history of debilitating anxiety presenting for evaluation of a possible panic attack.  This evening she became very anxious and very upset, then was brought by EMS for evaluation of this.  She is having difficulty completing sentences and describing to me how she feels or what has happened.  She is very frail, shaky, and appears extremely anxious.  The history is provided by the patient.  Anxiety  This is a chronic problem. The problem occurs constantly. The problem has been gradually worsening. Nothing aggravates the symptoms. Nothing relieves the symptoms.    Past Medical History:  Diagnosis Date  . Allergy   . Anemia   . Anxiety   . Depression   . Hemorrhoids     Patient Active Problem List   Diagnosis Date Noted  . Hypercholesterolemia 03/22/2017  . Nonspecific (abnormal) findings on radiological and other examination of body structure 12/26/2006  . NONSPECIFIC ABNORM FIND RAD&OTH EXAM LUNG FIELD 12/26/2006  . ANEMIA-IRON DEFICIENCY 12/15/2006  . Depression 12/15/2006  . Allergic rhinitis 12/15/2006    Past Surgical History:  Procedure Laterality Date  . arm surgery     Left  . TUBAL LIGATION       OB History   None      Home Medications    Prior to Admission medications   Medication Sig Start Date End Date Taking? Authorizing Provider  atorvastatin (LIPITOR) 20 MG tablet TAKE 1 TABLET BY MOUTH ONCE DAILY 01/12/17  Yes [name], MD  FLUoxetine (PROZAC) 20 MG capsule Take 1 capsule (20 mg total) by mouth daily. 05/19/17  Yes Laveda Abbe, NP  gabapentin (NEURONTIN) 100 MG capsule Take 1 capsule (100 mg total) by mouth 3 (three) times daily. 05/18/17  Yes Laveda Abbe, NP  hydrocortisone cream 1 % Apply to affected area 2 times daily 04/18/17  Yes Palumbo, April, MD  LORazepam (ATIVAN) 1 MG tablet Take 1 tablet (1 mg total) by mouth 2 (two) times daily. 03/22/17  Yes [name], MD  polyethylene glycol [facility]) packet Take 17 g by mouth 2 (two) times daily. 04/18/17  Yes Palumbo, April, MD    Family History Family History  Problem Relation Age of Onset  . Stroke Mother   . Hypertension Sister   . Cancer Neg Hx        breat and colon hx  . Diabetes Neg Hx        family    Social History Social History   Tobacco Use  . Smoking status: Never Smoker  . Smokeless tobacco: Never Used  Substance Use Topics  . Alcohol use: No  . Drug use: No     Allergies   Codeine; Sulfonamide derivatives; and Tetanus toxoid   Review of Systems Review of Systems  All other systems reviewed and are negative.    Physical Exam Updated Vital Signs BP (!) 123/103   Pulse 86   Temp 98 F (36.7 C) (Oral)   Resp 18   SpO2 100%   Physical Exam  Constitutional: She is oriented to person, place, and time. She appears well-developed and well-nourished. No distress.  HENT:  Head: Normocephalic and atraumatic.  Neck: Normal  range of motion. Neck supple.  Cardiovascular: Normal rate and regular rhythm. Exam reveals no gallop and no friction rub.  No murmur heard. Pulmonary/Chest: Effort normal and breath sounds normal. No respiratory distress. She has no wheezes.  Abdominal: Soft. Bowel sounds are normal. She exhibits no distension. There is no tenderness.  Musculoskeletal: Normal range of motion.  Neurological: She is alert and oriented to person, place, and time.  Skin: Skin is warm and dry. She is not diaphoretic.  Psychiatric: Her mood appears anxious. Her speech is tangential. She is slowed. She expresses no homicidal and no suicidal ideation.  Nursing note and vitals reviewed.    ED Treatments / Results  Labs (all labs ordered are  listed, but only abnormal results are displayed) Labs Reviewed  BASIC METABOLIC PANEL  CBC WITH DIFFERENTIAL/PLATELET  ETHANOL  URINALYSIS, ROUTINE W REFLEX MICROSCOPIC  RAPID URINE DRUG SCREEN, HOSP PERFORMED    EKG None  Radiology No results found.  Procedures Procedures (including critical care time)  Medications Ordered in ED Medications  LORazepam (ATIVAN) tablet 1 mg (has no administration in time range)     Initial Impression / Assessment and Plan / ED Course  I have reviewed the triage vital signs and the nursing notes.  Pertinent labs & imaging results that were available during my care of the patient were reviewed by me and considered in my medical decision making (see chart for details).  Patient presents here with anxiety/panic attacks.  She is very frail and can barely maintain a coherent conversation.  She is very shaky and anxious.  Her medical work-up is unremarkable.  I feel as though this patient might be best served by psychiatric consultation.  She may need a medication adjustment to see if this could make her more functional.  Final Clinical Impressions(s) / ED Diagnoses   Final diagnoses:  None    ED Discharge Orders    None       Geoffery Lyons, MD 05/26/17 0300

## 2017-05-25 NOTE — ED Notes (Signed)
No signs of fleas on patient while in triage

## 2017-05-25 NOTE — Progress Notes (Signed)
Patient ID: Amanda Hall, female   DOB: 1950/07/16, 67 y.o.   MRN: [id number]   Juanetta Beets, DO and Jacki Cones Bernis Schreur, NP have determined that this patient meets criteria for an inpatient gero psychiatric admission. TTS to seek placement.   Laveda Abbe, NP-C 05/25/2017       1556

## 2017-05-25 NOTE — ED Notes (Signed)
Pt refused 2200 due meds x 3 attempts.
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
## 2017-05-25 NOTE — ED Notes (Signed)
Pt provided lunch tray.

## 2017-05-25 NOTE — BH Assessment (Signed)
[facility] Assessment Progress Note  Per Jacqueline Norman, DO, this pt requires psychiatric hospitalization in a specialty geriatric unit at this time.  The following facilities have been contacted to seek placement for this pt, with results as noted:  At capacity:  [facility]  Laveah Gloster, MA Behavioral Health Coordinator [phone]     

## 2017-05-25 NOTE — ED Provider Notes (Signed)
7:05 AM Care assumed from Dr. Tami Ribas.  At time of transfer care, patient is awaiting assessment by TTS.  Anticipate following up on their recommendations.  3:50 PM I spoke with 1 of the psychiatry team and they going to recommend gero-psych placement.  Chest x-ray reassuring EKG did not show acute STEMI.  Patient awaiting placement.     Tegeler, Canary Brim, MD 05/25/17 2013

## 2017-05-25 NOTE — ED Notes (Signed)
Pt states she has urinated several times since being in the ED. Has not had to provide sample up to this point. Advised pt to let me know when she has to go to the bathroom again. Will try for the sample again shortly
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
## 2017-05-25 NOTE — ED Notes (Signed)
Pt provided breakfast tray.

## 2017-05-26 ENCOUNTER — Other Ambulatory Visit: Payer: Self-pay

## 2017-05-26 ENCOUNTER — Encounter (HOSPITAL_COMMUNITY): Payer: Self-pay

## 2017-05-26 ENCOUNTER — Inpatient Hospital Stay (HOSPITAL_COMMUNITY)
Admission: AD | Admit: 2017-05-26 | Discharge: 2017-06-10 | DRG: 885 | Disposition: A | Discharge status: Home / Self Care | Payer: Medicare Other | Source: Intra-hospital | Attending: Psychiatry | Admitting: Psychiatry

## 2017-05-26 DIAGNOSIS — Z8249 Family history of ischemic heart disease and other diseases of the circulatory system: Secondary | ICD-10-CM | POA: Diagnosis not present

## 2017-05-26 DIAGNOSIS — R251 Tremor, unspecified: Secondary | ICD-10-CM | POA: Diagnosis present

## 2017-05-26 DIAGNOSIS — Z818 Family history of other mental and behavioral disorders: Secondary | ICD-10-CM

## 2017-05-26 DIAGNOSIS — R823 Hemoglobinuria: Secondary | ICD-10-CM | POA: Diagnosis present

## 2017-05-26 DIAGNOSIS — R45851 Suicidal ideations: Secondary | ICD-10-CM | POA: Diagnosis not present

## 2017-05-26 DIAGNOSIS — Z823 Family history of stroke: Secondary | ICD-10-CM | POA: Diagnosis not present

## 2017-05-26 DIAGNOSIS — F329 Major depressive disorder, single episode, unspecified: Secondary | ICD-10-CM | POA: Diagnosis present

## 2017-05-26 DIAGNOSIS — N939 Abnormal uterine and vaginal bleeding, unspecified: Secondary | ICD-10-CM | POA: Diagnosis present

## 2017-05-26 DIAGNOSIS — Z681 Body mass index (BMI) 19 or less, adult: Secondary | ICD-10-CM | POA: Diagnosis not present

## 2017-05-26 DIAGNOSIS — F419 Anxiety disorder, unspecified: Secondary | ICD-10-CM

## 2017-05-26 DIAGNOSIS — R1310 Dysphagia, unspecified: Secondary | ICD-10-CM

## 2017-05-26 DIAGNOSIS — F411 Generalized anxiety disorder: Secondary | ICD-10-CM | POA: Diagnosis not present

## 2017-05-26 DIAGNOSIS — R4702 Dysphasia: Secondary | ICD-10-CM | POA: Diagnosis present

## 2017-05-26 DIAGNOSIS — Z741 Need for assistance with personal care: Secondary | ICD-10-CM | POA: Diagnosis not present

## 2017-05-26 DIAGNOSIS — R634 Abnormal weight loss: Secondary | ICD-10-CM | POA: Diagnosis not present

## 2017-05-26 DIAGNOSIS — F332 Major depressive disorder, recurrent severe without psychotic features: Secondary | ICD-10-CM | POA: Diagnosis not present

## 2017-05-26 DIAGNOSIS — R8271 Bacteriuria: Secondary | ICD-10-CM | POA: Diagnosis not present

## 2017-05-26 DIAGNOSIS — R600 Localized edema: Secondary | ICD-10-CM | POA: Diagnosis present

## 2017-05-26 DIAGNOSIS — Z9114 Patient's other noncompliance with medication regimen: Secondary | ICD-10-CM | POA: Diagnosis not present

## 2017-05-26 DIAGNOSIS — R450 Nervousness: Secondary | ICD-10-CM | POA: Diagnosis not present

## 2017-05-26 DIAGNOSIS — R638 Other symptoms and signs concerning food and fluid intake: Secondary | ICD-10-CM | POA: Diagnosis not present

## 2017-05-26 DIAGNOSIS — E7800 Pure hypercholesterolemia, unspecified: Secondary | ICD-10-CM | POA: Diagnosis present

## 2017-05-26 DIAGNOSIS — R609 Edema, unspecified: Secondary | ICD-10-CM | POA: Diagnosis not present

## 2017-05-26 DIAGNOSIS — Z742 Need for assistance at home and no other household member able to render care: Secondary | ICD-10-CM | POA: Diagnosis not present

## 2017-05-26 DIAGNOSIS — R630 Anorexia: Secondary | ICD-10-CM | POA: Diagnosis present

## 2017-05-26 DIAGNOSIS — Z79899 Other long term (current) drug therapy: Secondary | ICD-10-CM | POA: Diagnosis not present

## 2017-05-26 MED ORDER — LORAZEPAM 2 MG/ML IJ SOLN
1.0000 mg | Freq: Once | INTRAMUSCULAR | Status: AC
  Administered 2017-05-26: 1 mg via INTRAMUSCULAR
  Filled 2017-05-26: qty 1

## 2017-05-26 MED ORDER — LORAZEPAM 2 MG/ML IJ SOLN
1.0000 mg | Freq: Once | INTRAMUSCULAR | Status: DC
  Filled 2017-05-26: qty 1

## 2017-05-26 MED ORDER — ENSURE ENLIVE PO LIQD
237.0000 mL | Freq: Two times a day (BID) | ORAL | Status: DC
  Administered 2017-05-27 – 2017-06-01 (×9): 237 mL via ORAL

## 2017-05-26 MED ORDER — MIRTAZAPINE 15 MG PO TABS
15.0000 mg | ORAL_TABLET | Freq: Every day | ORAL | Status: DC
  Administered 2017-05-26 – 2017-05-27 (×2): 15 mg via ORAL
  Filled 2017-05-26 (×4): qty 1

## 2017-05-26 MED ORDER — LORAZEPAM 1 MG PO TABS
1.0000 mg | ORAL_TABLET | Freq: Once | ORAL | Status: DC
  Filled 2017-05-26: qty 1

## 2017-05-26 MED ORDER — CITALOPRAM HYDROBROMIDE 10 MG PO TABS
10.0000 mg | ORAL_TABLET | Freq: Every day | ORAL | Status: DC
  Administered 2017-05-26: 10 mg via ORAL
  Filled 2017-05-26: qty 1

## 2017-05-26 MED ORDER — LORAZEPAM 1 MG PO TABS
2.0000 mg | ORAL_TABLET | Freq: Once | ORAL | Status: DC
  Filled 2017-05-26 (×2): qty 2

## 2017-05-26 MED ORDER — GABAPENTIN 100 MG PO CAPS
100.0000 mg | ORAL_CAPSULE | Freq: Three times a day (TID) | ORAL | Status: DC
  Administered 2017-05-26 – 2017-05-27 (×3): 100 mg via ORAL
  Filled 2017-05-26 (×9): qty 1

## 2017-05-26 MED ORDER — MAGNESIUM HYDROXIDE 400 MG/5ML PO SUSP: 30.0000 mL | Freq: Every day | ORAL | Status: DC | PRN

## 2017-05-26 MED ORDER — ALUM & MAG HYDROXIDE-SIMETH 200-200-20 MG/5ML PO SUSP
30.0000 mL | ORAL | Status: DC | PRN
  Administered 2017-06-02: 30 mL via ORAL
  Filled 2017-05-26: qty 30

## 2017-05-26 MED ORDER — CITALOPRAM HYDROBROMIDE 10 MG PO TABS
10.0000 mg | ORAL_TABLET | Freq: Every day | ORAL | Status: DC
  Administered 2017-05-27: 10 mg via ORAL
  Filled 2017-05-26 (×3): qty 1

## 2017-05-26 MED ORDER — POTASSIUM CHLORIDE 20 MEQ PO PACK
40.0000 meq | PACK | Freq: Once | ORAL | Status: DC
  Administered 2017-05-26: 40 meq via ORAL
  Filled 2017-05-26: qty 2

## 2017-05-26 MED ORDER — POTASSIUM CHLORIDE 20 MEQ PO PACK
40.0000 meq | PACK | Freq: Once | ORAL | Status: DC
  Filled 2017-05-26: qty 2

## 2017-05-26 MED ORDER — ACETAMINOPHEN 325 MG PO TABS
650.0000 mg | ORAL_TABLET | Freq: Four times a day (QID) | ORAL | Status: DC | PRN
  Administered 2017-05-27 – 2017-06-08 (×3): 650 mg via ORAL
  Filled 2017-05-26 (×3): qty 2

## 2017-05-26 NOTE — Consult Note (Addendum)
[facility] Face-to-Face Psychiatry Consult   Reason for Consult:  Safety risk Referring Physician:  EDP Patient Identification: Amanda Hall MRN:  [id number] Principal Diagnosis: Major depressive disorder, recurrent severe without psychotic features [facility]) Diagnosis:   Patient Active Problem List   Diagnosis Date Noted  . Generalized anxiety disorder [F41.1] 05/26/2017    Priority: High  . Major depressive disorder, recurrent severe without psychotic features ([place]) [F33.2] 05/26/2017    Priority: High  . Hypercholesterolemia [E78.00] 03/22/2017  . Nonspecific (abnormal) findings on radiological and other examination of body structure [793] 12/26/2006  . NONSPECIFIC ABNORM FIND RAD&OTH EXAM LUNG FIELD [R93.0] 12/26/2006  . ANEMIA-IRON DEFICIENCY [D50.9] 12/15/2006  . Depression [F32.9] 12/15/2006  . Allergic rhinitis [J30.9] 12/15/2006    Total Time spent with patient: 45 minutes  Subjective:   Amanda Hall is a 67 y.o. female patient admitted with self harm risk.  HPI:  67 yo female who came to the ED with increase in depression and anxiety with minimal oral intake, not functioning at home.  Bruises on her body that she cannot explain, extreme weight loss.   Suicidal ideations but no homicidal ideations, hallucinations, or substance abuse.  Inpatient hospitalization needed as she is deteriorating.  Patient is functioning better today and can perform her ADLs independently, appropriate now for general psychiatric milieu.    Past Psychiatric History: depression, anxiety  Risk to Self: Suicidal Ideation: No(Pt denies. ) Suicidal Intent: No(Pt denies. ) Is patient at risk for suicide?: No Suicidal Plan?: No(Pt denies. ) Access to Means: No What has been your use of drugs/alcohol within the last 12 months?: Pending. How many times?: 0 Other Self Harm Risks: Pt denies.  Triggers for Past Attempts: None known Intentional Self Injurious Behavior: None Risk to Others: Homicidal Ideation: No(Pt  denies. ) Thoughts of Harm to Others: No(Pt denies. ) Current Homicidal Intent: No(Pt denies. ) Current Homicidal Plan: No(Pt denies. ) Access to Homicidal Means: No(Pt denies. ) Identified Victim: NA History of harm to others?: No(Pt denies. ) Assessment of Violence: None Noted Violent Behavior Description: NA Does patient have access to weapons?: No(Pt denies. ) Criminal Charges Pending?: No(Pt denies. ) Does patient have a court date: No(Pt denies. ) Prior Inpatient Therapy: Prior Inpatient Therapy: No Prior Outpatient Therapy: Prior Outpatient Therapy: No Does patient have an ACCT team?: No Does patient have Intensive In-House Services?  : No Does patient have [facility] services? : No Does patient have P4CC services?: No  Past Medical History:  Past Medical History:  Diagnosis Date  . Allergy   . Anemia   . Anxiety   . Depression   . Hemorrhoids     Past Surgical History:  Procedure Laterality Date  . arm surgery     Left  . TUBAL LIGATION     Family History:  Family History  Problem Relation Age of Onset  . Stroke Mother   . Hypertension Sister   . Cancer Neg Hx        breat and colon hx  . Diabetes Neg Hx        family   Family Psychiatric  History: none Social History:  Social History   Substance and Sexual Activity  Alcohol Use No     Social History   Substance and Sexual Activity  Drug Use No    Social History   Socioeconomic History  . Marital status: Divorced    Spouse name: Not on file  . Number of children: Not on file  .  Years of education: Not on file  . Highest education level: Not on file  Occupational History  . Not on file  Social Needs  . Financial resource strain: Not on file  . Food insecurity:    Worry: Not on file    Inability: Not on file  . Transportation needs:    Medical: Not on file    Non-medical: Not on file  Tobacco Use  . Smoking status: Never Smoker  . Smokeless tobacco: Never Used  Substance and Sexual  Activity  . Alcohol use: No  . Drug use: No  . Sexual activity: Not on file  Lifestyle  . Physical activity:    Days per week: Not on file    Minutes per session: Not on file  . Stress: Not on file  Relationships  . Social connections:    Talks on phone: Not on file    Gets together: Not on file    Attends religious service: Not on file    Active member of club or organization: Not on file    Attends meetings of clubs or organizations: Not on file    Relationship status: Not on file  Other Topics Concern  . Not on file  Social History Narrative  . Not on file   Additional Social History: N/A    Allergies:   Allergies  Allergen Reactions  . Codeine Other (See Comments)    "makes head fell crazy"  . Sulfonamide Derivatives Nausea Only  . Tetanus Toxoid Other (See Comments)    "Made a Knot"    Labs:  Results for orders placed or performed during the hospital encounter of 05/25/17 (from the past 48 hour(s))  Basic metabolic panel     Status: Abnormal   Collection Time: 05/25/17  2:34 AM  Result Value Ref Range   Sodium 140 135 - 145 mmol/L   Potassium 3.2 (L) 3.5 - 5.1 mmol/L   Chloride 101 101 - 111 mmol/L   CO2 26 22 - 32 mmol/L   Glucose, Bld 129 (H) 65 - 99 mg/dL   BUN 21 (H) 6 - 20 mg/dL   Creatinine, Ser 0.96 0.44 - 1.00 mg/dL   Calcium 10.0 8.9 - 10.3 mg/dL   GFR calc non Af Amer >60 >60 mL/min   GFR calc Af Amer >60 >60 mL/min    Comment: (NOTE) The eGFR has been calculated using the CKD EPI equation. This calculation has not been validated in all clinical situations. eGFR's persistently <60 mL/min signify possible Chronic Kidney Disease.    Anion gap 13 5 - 15    Comment: Performed at [facility], [place] [address] [place], [place] [address]  CBC with Differential     Status: None   Collection Time: 05/25/17  2:34 AM  Result Value Ref Range   WBC 6.1 4.0 - 10.5 K/uL   RBC 4.48 3.87 - 5.11 MIL/uL   Hemoglobin 13.2 12.0 - 15.0 g/dL    HCT 39.1 36.0 - 46.0 %   MCV 87.3 78.0 - 100.0 fL   MCH 29.5 26.0 - 34.0 pg   MCHC 33.8 30.0 - 36.0 g/dL   RDW 13.0 11.5 - 15.5 %   Platelets 300 150 - 400 K/uL   Neutrophils Relative % 54 %   Neutro Abs 3.3 1.7 - 7.7 K/uL   Lymphocytes Relative 35 %   Lymphs Abs 2.1 0.7 - 4.0 K/uL   Monocytes Relative 9 %   Monocytes Absolute 0.5 0.1 -  1.0 K/uL   Eosinophils Relative 1 %   Eosinophils Absolute 0.1 0.0 - 0.7 K/uL   Basophils Relative 1 %   Basophils Absolute 0.0 0.0 - 0.1 K/uL    Comment: Performed at [facility], [place] [address] [place], [place] [address]  Ethanol     Status: None   Collection Time: 05/25/17  2:34 AM  Result Value Ref Range   Alcohol, Ethyl (B) <10 <10 mg/dL    Comment: (NOTE) Lowest detectable limit for serum alcohol is 10 mg/dL. For medical purposes only. Performed at [facility], [place] [address], [place], [place] [address]   Urinalysis, Routine w reflex microscopic     Status: None   Collection Time: 05/25/17  6:00 AM  Result Value Ref Range   Color, Urine YELLOW YELLOW   APPearance CLEAR CLEAR   Specific Gravity, Urine 1.009 1.005 - 1.030   pH 6.0 5.0 - 8.0   Glucose, UA NEGATIVE NEGATIVE mg/dL   Hgb urine dipstick NEGATIVE NEGATIVE   Bilirubin Urine NEGATIVE NEGATIVE   Ketones, ur NEGATIVE NEGATIVE mg/dL   Protein, ur NEGATIVE NEGATIVE mg/dL   Nitrite NEGATIVE NEGATIVE   Leukocytes, UA NEGATIVE NEGATIVE    Comment: Performed at [place] [address], [place], [place] [address]  Urine rapid drug screen (hosp performed)     Status: Abnormal   Collection Time: 05/25/17  6:00 AM  Result Value Ref Range   Opiates NONE DETECTED NONE DETECTED   Cocaine NONE DETECTED NONE DETECTED   Benzodiazepines POSITIVE (A) NONE DETECTED   Amphetamines NONE DETECTED NONE DETECTED   Tetrahydrocannabinol NONE DETECTED NONE DETECTED   Barbiturates NONE DETECTED NONE DETECTED    Comment: (NOTE) DRUG  SCREEN FOR MEDICAL PURPOSES ONLY.  IF CONFIRMATION IS NEEDED FOR ANY PURPOSE, NOTIFY LAB WITHIN 5 DAYS. LOWEST DETECTABLE LIMITS FOR URINE DRUG SCREEN Drug Class                     Cutoff (ng/mL) Amphetamine and metabolites    1000 Barbiturate and metabolites    200 Benzodiazepine                 616 Tricyclics and metabolites     300 Opiates and metabolites        300 Cocaine and metabolites        300 THC                            50 Performed at [facility], [place] [address], [place], [place] [address]     Current Facility-Administered Medications  Medication Dose Route Frequency Provider Last Rate Last Dose  . gabapentin (NEURONTIN) capsule 100 mg  100 mg Oral TID Buford Dresser J, DO   100 mg at 05/26/17 1049  . LORazepam (ATIVAN) tablet 1 mg  1 mg Oral Once Sofia, Leslie K, PA-C      . mirtazapine (REMERON) tablet 15 mg  15 mg Oral QHS Faythe Dingwall, DO       Current Outpatient Medications  Medication Sig Dispense Refill  . atorvastatin (LIPITOR) 20 MG tablet TAKE 1 TABLET BY MOUTH ONCE DAILY 90 tablet 1  . FLUoxetine (PROZAC) 20 MG capsule Take 1 capsule (20 mg total) by mouth daily. 30 capsule 0  . gabapentin (NEURONTIN) 100 MG capsule Take 1 capsule (100 mg total) by mouth 3 (three) times daily. 90 capsule 0  .  hydrocortisone cream 1 % Apply to affected area 2 times daily 15 g 0  . LORazepam (ATIVAN) 1 MG tablet Take 1 tablet (1 mg total) by mouth 2 (two) times daily. 60 tablet 3  . polyethylene glycol (MIRALAX) packet Take 17 g by mouth 2 (two) times daily. 28 each 0    Musculoskeletal: Strength & Muscle Tone: within normal limits Gait & Station: normal Patient leans: N/A  Psychiatric Specialty Exam: Physical Exam  Nursing note and vitals reviewed. Constitutional: She is oriented to person, place, and time. She appears well-developed and well-nourished.  HENT:  Head: Normocephalic and atraumatic.  Neck: Normal range of motion.   Respiratory: Effort normal.  Musculoskeletal: Normal range of motion.  Neurological: She is alert and oriented to person, place, and time.  Psychiatric: Her speech is normal and behavior is normal. Judgment normal. Her mood appears anxious. Cognition and memory are normal. She exhibits a depressed mood.    Review of Systems  Psychiatric/Behavioral: Positive for depression. Negative for suicidal ideas. The patient is nervous/anxious.   All other systems reviewed and are negative.   Blood pressure (!) 156/110, pulse (!) 121, temperature 98.5 F (36.9 C), temperature source Oral, resp. rate (!) 22, SpO2 95 %.There is no height or weight on file to calculate BMI.  General Appearance: Casual  Eye Contact:  Fair  Speech:  Normal Rate  Volume:  Normal  Mood:  Anxious and Depressed  Affect:  Congruent  Thought Process:  Coherent and Descriptions of Associations: Intact  Orientation:  Full (Time, Place, and Person)  Thought Content:  Rumination  Suicidal Thoughts:  No  Homicidal Thoughts:  No  Memory:  Immediate;   Fair Recent;   Fair Remote;   Fair  Judgement:  Impaired  Insight:  Fair  Psychomotor Activity:  Decreased  Concentration:  Concentration: Fair and Attention Span: Fair  Recall:  [employer] of Knowledge:  Fair  Language:  Fair  Akathisia:  No  Handed:  Right  AIMS (if indicated):   N/A  Assets:  Leisure Time Physical Health Resilience  ADL's:  Intact  Cognition:  WNL  Sleep:   N/A     Treatment Plan Summary: Daily contact with patient to assess and evaluate symptoms and progress in treatment, Medication management and Plan major depressive disorder, recurrent, severe without psychosis:  -Started Celexa 10 mg daily for depression -Continued Remeron 15 mg at bedtime for depression and increase appetite  Anxiety: -Continued Gabapentin 100 mg TID for anxiety  Disposition: Recommend psychiatric Inpatient admission when medically cleared.  Waylan Boga,  NP 05/26/2017 12:01 PM   Patient seen face-to-face for psychiatric evaluation, chart reviewed and case discussed with the physician extender and developed treatment plan. Reviewed the information documented and agree with the treatment plan.  Buford Dresser, DO 05/26/17 7:14 PM

## 2017-05-26 NOTE — ED Provider Notes (Signed)
Patient has been assessed by behavioral health for geriatric psych placement.  Was recommendation for inpatient treatment.  This morning patient is extremely anxious.  She has remained seated in the corner of the room in a chair trembling.  Myself as well as my physician assistant have interviewed and reassured the patient.  She remains debilitated by anxiety.  She becomes repetitive and perseverates on certain medication that she cannot recall.  She states she does take Ativan regularly at home.  She cannot explain exactly when things really got bad.  She admits that severe anxiety has been a problem for some time. Physical Exam  BP (!) 156/110   Pulse (!) 121   Temp 98.5 F (36.9 C) (Oral)   Resp (!) 22   SpO2 95%   Physical Exam  Constitutional:  Patient is alert and hypervigilant.  She is sitting in a chair in the corner of the room.  She is tremulous.  Hands are shaking.  Eye contact is very poor.  HENT:  Head: Normocephalic and atraumatic.  Mouth/Throat: Oropharynx is clear and moist.  Eyes: EOM are normal.  Cardiovascular: Normal rate, regular rhythm and normal heart sounds.  Pulmonary/Chest: Effort normal and breath sounds normal.  Abdominal: Soft. She exhibits no distension. There is no tenderness.  Musculoskeletal: Normal range of motion. She exhibits no edema or tenderness.  Neurological: She is alert. She exhibits normal muscle tone. Coordination normal.  Skin: Skin is warm and dry.  Psychiatric:  Patient is extremely withdrawn and anxious.  Eye contact poor.    ED Course/Procedures     Procedures  MDM  Patient has been recommended by behavioral health for geriatric psychiatric facility.  At this time, patient is refusing Ativan.  She is extremely anxious and hypervigilant.  She is repeating the same thought over and over.  I feel the patient is unsafe for any potential discharge to home in her current condition.  She does not appear capable for self-care at this time.   IVC paperwork completed.  Plan to administer Ativan IM.  I have concerned that with the patient now refusing any Ativan or some potential for withdrawal given his this is been a routine medication for her.  Will reassess after medication administration.       Arby Barrette, MD 05/26/17 [phone]

## 2017-05-26 NOTE — BHH Counselor (Addendum)
Reassessment note: Pt presenting calm, with no SOB or shaking hands. She engaged in pleasant conversation about family and pets. After this writer noted pt's reduced anxiety, with a little smile she stated she took a shot. Pt stated she was reluctant to take the medication due to feeling sedated. Pt denies SI, HI & AVH.  She is oriented to place, time, person and situation. Pt agreeable she feels much better without intense anxiety sx. Continue to seek inpt Gero tx.

## 2017-05-26 NOTE — Progress Notes (Signed)
Patient ID: Amanda Hall, female   DOB: May 17, 1950, 67 y.o.   MRN: [id number] 1:1-   D: Pt currently sitting in a wheelchair in her room speaking with her sitter. Sitter is currently at side. Pt reports decreased appetite, irritation in her belly button and anxiety. Pt also reports some financial concerns. Endorses support and comfort from her small terrier dog.   A: Emotional support given, pt given opportunity to express concerns. Encouraged nutritional intake and attendance in groups. Sitter encouraged to share any pertinent observations.   R: Pt remains safe on a 1:1 per MD orders. Pt in no current distress. Pt reports eating a yogurt and some fruit, open to ensures if ordered. Will continue to monitor.

## 2017-05-26 NOTE — ED Notes (Signed)
Patient up in room. Patient visibly shakey and anxious upon entering the room. Patient stating that she has not slept since she has been here. Patient escorted to bathroom at patient request. EDP notified, see MAR for orders.

## 2017-05-26 NOTE — Progress Notes (Signed)
Amanda Hall is a 67 year old female being admitted involuntarily to 400-2 from [facility].  She came to the ED initially for chest pain.  She was medically cleared but complained of ongoing severe anxiety and depression.  During [facility] admission, she was very focused on her chest pain, hemorrhoids and bruising easily for unknown reason.  She reported multiple falls and pt has multiple bruises all over her body.  She stated that she is unsteady on her feet, hasn't taken a bath in a while (using baby wipes for bathing) because she cannot get in and out of the tub.  She has not been eating or sleeping well and has noted 20+ pounds lost.  She was noted to have difficulty walking and was brought to unit in a wheel chair.  She expressed that she will need help with using the bathroom because she is unable to wipe herself.  She is currently wearing an adult diaper "just in case."  She does not use a cane at home but does use hand rails and holding on to things to help ambulate.  She denies SI/HI or A/V hallucinations.  She is very somatic and ruminated about her hemorrhoids and chest pain due to the anxiety.  She was very shaky and her shakiness got worse the more we talked.  She is very uncomfortable about being on the unit and reports paranoia with being around other people.   Oriented her to the unit.  Admission paperwork completed and signed.  Belongings searched and secured in locker # 39, no contraband found.  Skin assessment completed and noted large bruise right forearm and lower back as well has various small bruises on legs and arms.  Q 15 minute checks initiated for safety.  We will continue to monitor the progress towards her goals.

## 2017-05-26 NOTE — ED Notes (Addendum)
Attempted multiple times to medicate patient for her anxiety. Patient refusing medications or unwilling to answer whether or not she would like the medication. Attempted multiple times with the PA at bedside and unsuccessful. Patient unwilling to take IM or PO anxiety medications. Patient avoiding answering questions. When asking patient why she would not take the medication, patient would attempt to redirect or not answer question. Patient is visibly shaking, unable to speak in full sentences, and cannot be redirected despite efforts from multiple staff. Patient showing newer signs of paranoia stating to this writer  "I thought you might have put something in my juice." EDPA made aware.

## 2017-05-26 NOTE — ED Notes (Signed)
Pt provided breakfast tray.

## 2017-05-26 NOTE — Progress Notes (Signed)
Called report to [place] at [facility].  Called [employer] to arrange transportation.

## 2017-05-26 NOTE — Progress Notes (Signed)
Patient ID: Amanda Hall, female   DOB: [birth year], 67 y.o.   MRN: [id number] PER STATE REGULATIONS 482.30  THIS CHART WAS REVIEWED FOR MEDICAL NECESSITY WITH RESPECT TO THE PATIENT'S ADMISSION/DURATION OF STAY.  NEXT REVIEW DATE: 05/30/17  Loura Halt, RN, BSN CASE MANAGER

## 2017-05-26 NOTE — Tx Team (Signed)
Initial Treatment Plan 05/26/2017 6:47 PM [name] WUJ:[id number]    PATIENT STRESSORS: Financial difficulties Health problems   PATIENT STRENGTHS: General fund of knowledge Supportive family/friends   PATIENT IDENTIFIED PROBLEMS: Depression  Anxiety   "Help me with my anxiety"                 DISCHARGE CRITERIA:  Improved stabilization in mood, thinking, and/or behavior Safe-care adequate arrangements made Verbal commitment to aftercare and medication compliance  PRELIMINARY DISCHARGE PLAN: Outpatient therapy Medication management  PATIENT/FAMILY INVOLVEMENT: This treatment plan has been presented to and reviewed with the patient, Tyneisha Hegeman.  The patient and family have been given the opportunity to ask questions and make suggestions.  Levin Bacon, RN 05/26/2017, 6:47 PM

## 2017-05-26 NOTE — BH Assessment (Signed)
[facility] Assessment Progress Note  Per Juanetta Beets, DO, this pt requires psychiatric hospitalization.  Malva Limes, RN, [facility] has assigned pt to [facility] Rm 400-2.  Pt presents under IVC initiated by EDP Arby Barrette, MD, and IVC documents have been faxed to [facility].  Pt's nurse, Ginger, has been notified, and agrees to call report to [phone].  Pt is to be transported via [employer].   Doylene Canning, [place] [facility] Coordinator [phone]

## 2017-05-26 NOTE — ED Notes (Signed)
Ativan administered by [facility] RN w/ no resistance by the Pt.  This writer only gentle held the Pt's hand, so she won't flinch.

## 2017-05-26 NOTE — Progress Notes (Signed)
Patient ID: Faatimah Spielberg, female   DOB: 08-20-1950, 67 y.o.   MRN: [id number]  Pt currently presents with a flat affect and cooperative behavior. Pt reports ongoing anxiety and has multiple somatic complaints including throat irritation, mouth soreness and belly button irritation. Pt reports ongoing financial concerns at home. Interaction with pt is pleasant, hesitant about taking medications. Pt reports poor sleep PTA, reports sleeping "a couple hours at a time." Pt reports ongoing weakness, uses wheelchair for ambulation.   Pt provided with medications per providers orders. Pt's labs and vitals were monitored throughout the night. Pt given a 1:1 about emotional and mental status. Pt supported and encouraged to express concerns and questions. Pt educated on medications and sleep hygiene.   Pt's safety ensured with 15 minute and environmental checks. Pt currently denies SI/HI and A/V hallucinations. Pt verbally agrees to seek staff if SI/HI or A/VH occurs and to consult with staff before acting on any harmful thoughts. Pt remains on a 1:1 for safety. Will continue POC.

## 2017-05-26 NOTE — ED Notes (Signed)
Per EDP, Pt is being IVC'd.  There are concerns she is withdrawing from Ativan.

## 2017-05-27 DIAGNOSIS — F332 Major depressive disorder, recurrent severe without psychotic features: Principal | ICD-10-CM

## 2017-05-27 DIAGNOSIS — Z79899 Other long term (current) drug therapy: Secondary | ICD-10-CM

## 2017-05-27 DIAGNOSIS — Z9114 Patient's other noncompliance with medication regimen: Secondary | ICD-10-CM

## 2017-05-27 DIAGNOSIS — Z818 Family history of other mental and behavioral disorders: Secondary | ICD-10-CM

## 2017-05-27 DIAGNOSIS — F419 Anxiety disorder, unspecified: Secondary | ICD-10-CM

## 2017-05-27 MED ORDER — GABAPENTIN 100 MG PO CAPS
100.0000 mg | ORAL_CAPSULE | Freq: Two times a day (BID) | ORAL | Status: DC
  Administered 2017-05-28: 100 mg via ORAL
  Filled 2017-05-27 (×3): qty 1

## 2017-05-27 MED ORDER — HYDROXYZINE HCL 25 MG PO TABS: 25.0000 mg | ORAL_TABLET | Freq: Four times a day (QID) | ORAL | Status: DC | PRN

## 2017-05-27 MED ORDER — LORAZEPAM 0.5 MG PO TABS
0.5000 mg | ORAL_TABLET | Freq: Three times a day (TID) | ORAL | Status: DC
  Administered 2017-05-27 – 2017-05-28 (×2): 0.5 mg via ORAL
  Filled 2017-05-27 (×2): qty 1

## 2017-05-27 MED ORDER — LORAZEPAM 1 MG PO TABS
1.0000 mg | ORAL_TABLET | Freq: Once | ORAL | Status: AC
  Administered 2017-05-27: 1 mg via ORAL
  Filled 2017-05-27: qty 1

## 2017-05-27 NOTE — Progress Notes (Addendum)
Patient ID: Amanda Hall, female   DOB: 09/16/1950, 66 y.o.   MRN: [id number]  1:1-   D: Pt currently lying in bed with eyes closed. Respirations even and unlabored. Sitter is currently within arms reach of patient.  A: Visual assessment of patient and environmental safety completed. Sitter encouraged to share any pertinent observations. None to note. R: Pt remains safe on a 1:1 per MD orders. Pt in no current distress. Will continue to monitor.     
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
## 2017-05-27 NOTE — Progress Notes (Signed)
Recreation Therapy Notes  Date: 5.17.19 Time: 0930 Location: [address] Dayroom  Group Topic: Stress Management  Goal Area(s) Addresses:  Patient will verbalize importance of using healthy stress management.  Patient will identify positive emotions associated with healthy stress management.   Intervention: Stress Management  Activity :  Progressive Muscle Relaxation.  LRT lead patients through the process of tensing each muscle group then releasing the tension.  Patients were to follow along as LRT read script to guide patients through the process.  Education:  Stress Management, Discharge Planning.   Education Outcome: Acknowledges edcuation/In group clarification offered/Needs additional education  Clinical Observations/Feedback: Pt did not attend group.    Caroll Rancher, LRT/CTRS         Caroll Rancher A 05/27/2017 12:18 PM

## 2017-05-27 NOTE — Progress Notes (Signed)
Patient self inventory- Patient slept well last night, sleep medication helped. Appetite has been poor, energy level low, concentration has been poor. Depression and hopelessness rated 0 out of 10. Anxiety rated 9 out of 10. Denies SI/HI/AVH. Patient complains of physical problems- back pain and hemorrhoid pain. (patient was given tylenol 650). Patient stated "the thought of taking a bath or shower makes me nervous." Patient's goal for the day is to "not have any anxiety at all." Patient also states she has anxiety about talking to the doctor and to staff.   Patient is much more relaxed after taking Ativan 1 mg and then her scheduled 0.5 mg. She was concerned about the dosage, but we explained that this is what is ordered for her right now. Patient verbalized acceptance. Patient ate about 30-40% of her dinner and two vanilla pudding cups. Staff is encouraging fluids.

## 2017-05-27 NOTE — H&P (Addendum)
Psychiatric Admission Assessment Adult  Patient Identification: Amanda Hall MRN:  [id number] Date of Evaluation:  05/27/2017 Chief Complaint: "Anxiety" Principal Diagnosis: MDD, Severe, without Psychotic Features, Consider GAD, Panic Disorder  Diagnosis:   Patient Active Problem List   Diagnosis Date Noted  . Generalized anxiety disorder [F41.1] 05/26/2017  . Major depressive disorder, recurrent severe without psychotic features (HCC) [F33.2] 05/26/2017  . Hypercholesterolemia [E78.00] 03/22/2017  . Nonspecific (abnormal) findings on radiological and other examination of body structure [793] 12/26/2006  . NONSPECIFIC ABNORM FIND RAD&OTH EXAM LUNG FIELD [R93.0] 12/26/2006  . ANEMIA-IRON DEFICIENCY [D50.9] 12/15/2006  . Depression [F32.9] 12/15/2006  . Allergic rhinitis [J30.9] 12/15/2006   History of Present Illness: 67 year old female, presented to the emergency room due to worsening depression and anxiety.  At this time emphasizes anxiety as a major symptom , states that she feels her anxiety has become "unbearable" .  Describes her anxiety as a tendency to worry excessively .  She endorses neuro vegetative symptoms of depression to include loss of appetite.  States she has lost 20 to 30 pounds over recent weeks. Of note patient denies suicidal plans or intentions. Presents with bruises on upper extremities- denies falls.  Denies psychotic symptoms. Patient currently a fair historian and has difficulty reporting whether she was taking medications regularly. As per chart,recent change in PCP has been a major stressor, chart notes indicate medication noncompliance ( had run out of medication) . Patient does state she had been taking Ativan as prescribed recently . States " I know because my sister made sure I took them". Denies abusing or misusing . Admission UDS positive for BZDs . Associated Signs/Symptoms: Depression Symptoms:  depressed mood, anhedonia, anxiety, loss of  energy/fatigue, decreased appetite, (Hypo) Manic Symptoms: None noted or endorsed Anxiety Symptoms: As above, describes severe anxiety Psychotic Symptoms:  Denies, does not appear internally preoccupied, no delusions expressed PTSD Symptoms: Does not endorse Total Time spent with patient: 45 minutes  Past Psychiatric History:, History of anxiety/ history of depression.  Denies prior psychiatric admissions, states she has never attempted suicide, denies any history of self cutting or self-injurious behaviors, denies history of psychosis, denies history of PTSD.  Does endorse history of severe depression and also of anxiety, which she describes mostly as having panic attacks and agoraphobia as well as worrying excessively. Admission UDS positive for benzodiazepines (patient reports she is prescribed Ativan), admission BAL negative.  Is the patient at risk to self? Yes.    Has the patient been a risk to self in the past 6 months? No.  Has the patient been a risk to self within the distant past? No.  Is the patient a risk to others? No.  Has the patient been a risk to others in the past 6 months? No.  Has the patient been a risk to others within the distant past? No.   Prior Inpatient Therapy:  denies  Prior Outpatient Therapy:  had been following up with her PCP  Alcohol Screening: 1. How often do you have a drink containing alcohol?: Never 2. How many drinks containing alcohol do you have on a typical day when you are drinking?: 1 or 2 3. How often do you have six or more drinks on one occasion?: Never AUDIT-C Score: 0 9. Have you or someone else been injured as a result of your drinking?: No 10. Has a relative or friend or a doctor or another health worker been concerned about your drinking or suggested you cut down?: No Alcohol  Use Disorder Identification Test Final Score (AUDIT): 0 Intervention/Follow-up: AUDIT Score <7 follow-up not indicated Substance Abuse History in the last 12  months: She denies alcohol or drug abuse Consequences of Substance Abuse: denies Previous Psychotropic Medications: She had been on Prozac 20 mg daily, Neurontin 100 mg 3 times a day, Ativan 1 mg twice a day Psychological Evaluations:  No  Past Medical History: Denies medical illnesses Past Medical History:  Diagnosis Date  . Allergy   . Anemia   . Anxiety   . Depression   . Hemorrhoids     Past Surgical History:  Procedure Laterality Date  . arm surgery     Left  . TUBAL LIGATION     Family History: Parents deceased, mother died from complications of a CVA, father died in 06/10/2011 from complications of diabetes.  Patient has 1 sister and 1 brother. Family History  Problem Relation Age of Onset  . Stroke Mother   . Hypertension Sister   . Cancer Neg Hx        breat and colon hx  . Diabetes Neg Hx        family   Family Psychiatric  History: Reports grandfather had history of depression and committed suicide Tobacco Screening: Have you used any form of tobacco in the last 30 days? (Cigarettes, Smokeless Tobacco, Cigars, and/or Pipes): No Social History: Divorced, no children, lives with a sister, on Social Security has 2 dogs Social History   Substance and Sexual Activity  Alcohol Use No     Social History   Substance and Sexual Activity  Drug Use No    Additional Social History: Marital status: Divorced Divorced, when?: More than 20 years What types of issues is patient dealing with in the relationship?: "Just didnt work out" Are you sexually active?: No What is your sexual orientation?: Heterosexual  Has your sexual activity been affected by drugs, alcohol, medication, or emotional stress?: No  Does patient have children?: No  Allergies:   Allergies  Allergen Reactions  . Codeine Other (See Comments)    "makes head fell crazy"  . Sulfonamide Derivatives Nausea Only  . Tetanus Toxoid Other (See Comments)    "Made a Knot"   Lab Results: No results found for  this or any previous visit (from the past 48 hour(s)).  Blood Alcohol level:  Lab Results  Component Value Date   ETH <10 05/25/2017   ETH <10 05/18/2017    Metabolic Disorder Labs:  No results found for: HGBA1C, MPG No results found for: PROLACTIN Lab Results  Component Value Date   CHOL 189 03/22/2017   TRIG 218.0 (H) 03/22/2017   HDL 50.60 03/22/2017   CHOLHDL 4 03/22/2017   VLDL 43.6 (H) 03/22/2017   LDLCALC 226 (H) 07/13/2016    Current Medications: Current Facility-Administered Medications  Medication Dose Route Frequency Provider Last Rate Last Dose  . acetaminophen (TYLENOL) tablet 650 mg  650 mg Oral Q6H PRN Charm Rings, NP   650 mg at 05/27/17 0954  . alum & mag hydroxide-simeth (MAALOX/MYLANTA) 200-200-20 MG/5ML suspension 30 mL  30 mL Oral Q4H PRN Charm Rings, NP      . citalopram (CELEXA) tablet 10 mg  10 mg Oral Daily Charm Rings, NP   10 mg at 05/27/17 0820  . feeding supplement (ENSURE ENLIVE) (ENSURE ENLIVE) liquid 237 mL  237 mL Oral BID BM Anastasha Ortez, Rockey Situ, MD   237 mL at 05/27/17 1341  . gabapentin (NEURONTIN) capsule 100  mg  100 mg Oral TID Charm Rings, NP   100 mg at 05/27/17 1254  . magnesium hydroxide (MILK OF MAGNESIA) suspension 30 mL  30 mL Oral Daily PRN Charm Rings, NP      . mirtazapine (REMERON) tablet 15 mg  15 mg Oral QHS Charm Rings, NP   15 mg at 05/26/17 2142  . potassium chloride (KLOR-CON) packet 40 mEq  40 mEq Oral Once Charm Rings, NP       PTA Medications: Medications Prior to Admission  Medication Sig Dispense Refill Last Dose  . atorvastatin (LIPITOR) 20 MG tablet TAKE 1 TABLET BY MOUTH ONCE DAILY 90 tablet 1 Past Week at Unknown time  . FLUoxetine (PROZAC) 20 MG capsule Take 1 capsule (20 mg total) by mouth daily. 30 capsule 0 Past Week at Unknown time  . gabapentin (NEURONTIN) 100 MG capsule Take 1 capsule (100 mg total) by mouth 3 (three) times daily. 90 capsule 0 Past Week at Unknown time  .  hydrocortisone cream 1 % Apply to affected area 2 times daily 15 g 0 Past Month at Unknown time  . LORazepam (ATIVAN) 1 MG tablet Take 1 tablet (1 mg total) by mouth 2 (two) times daily. 60 tablet 3 Past Week at Unknown time  . polyethylene glycol (MIRALAX) packet Take 17 g by mouth 2 (two) times daily. 28 each 0 Past Month at Unknown time    Musculoskeletal: Strength & Muscle Tone: distal tremors Gait & Station: currently mobilizing on wheel chair and on one to one due to fall risk, unstable gait  Patient leans: N/A  Psychiatric Specialty Exam: Physical Exam  Review of Systems  Constitutional: Positive for weight loss.  HENT: Positive for sore throat.   Eyes: Negative.   Respiratory: Negative.   Cardiovascular: Negative.   Gastrointestinal: Negative.   Genitourinary: Negative.   Musculoskeletal: Negative.   Skin: Negative.   Neurological: Negative.   Endo/Heme/Allergies: Bruises/bleeds easily.  Psychiatric/Behavioral: Positive for depression. The patient is nervous/anxious.   All other systems reviewed and are negative.   Blood pressure (!) 96/51, pulse 97, temperature 98 F (36.7 C), temperature source Oral, resp. rate 20, height  (1.702 m), weight 52.2 kg (115 lb).Body mass index is 18.01 kg/m.  Repeat BP 132/54, pulse 102   General Appearance: Fairly Groomed  Eye Contact:  Fair  Speech:  Normal Rate  Volume:  Decreased  Mood:  Anxious and Depressed  Affect:  anxious, constricted, intermittently tearful  Thought Process:  Linear and Descriptions of Associations: Intact  Orientation:  Other:  she is alert, attentive, oriented to day of week, month, year, and to [place], psychiatric hospital.   Thought Content:  denies hallucinations,no delusions expressed, presents ruminative  Suicidal Thoughts:  No at this time denies suicidal or self injurious ideations, denies homicidal or violent ideations, contracts for safety on unit   Homicidal Thoughts:  No  Memory:  3/3  immediate, 0/3 at 5 minutes   Judgement:  Fair  Insight:  Fair  Psychomotor Activity:  Decreased- (+) distal tremors   Concentration:  Concentration: Fair and Attention Span: Fair  Recall:  [employer] of Knowledge:  Good  Language:  Good  Akathisia:  Negative  Handed:  Right  AIMS (if indicated):     Assets:  Desire for Improvement Resilience  ADL's: fair   Cognition:  WNL  Sleep:  Number of Hours: 6.5    Treatment Plan Summary: Daily contact with patient to assess  and evaluate symptoms and progress in treatment, Medication management, Plan inpatient treatment  and medications as below  Observation Level/Precautions:  One to one for fall precautions   Laboratory:  TSH, repeat BMP to monitor K+, electrolytes  Monitor EKG for QTc interval .   Psychotherapy:  Milieu, group therapy   Medications:   Has been started on Celexa 10 mgr QDAY and on Remeron 15 mgrs QHS.  Recent EKG ( 4/30) -QTc 502. Based on above, will discontinue Celexa and Vistaril PRNs  Patient reports she has been on Ativan prior to admission, and admission UDS positive for BZDs. Currently severely anxious, vaguely tremulous.  Ativan 1 mgr x 1 now and then 0.5 mgrs Q 8 hours .Hold if sedated or drowsy. Consider gradual taper.  Remeron 15 mgrs QHS D/C Celexa , see above  D/C Vistaril     Consultations:  As needed   Discharge Concerns:  -   Estimated LOS:5-6 days   Other:     Physician Treatment Plan for Primary Diagnosis: MDD, no psychotic features  Long Term Goal(s): Improvement in symptoms so as ready for discharge  Short Term Goals: Ability to identify changes in lifestyle to reduce recurrence of condition will improve and Ability to maintain clinical measurements within normal limits will improve  Physician Treatment Plan for Secondary Diagnosis: Active Problems:   Major depressive disorder, recurrent severe without psychotic features (HCC)  Long Term Goal(s): Improvement in symptoms so as ready for  discharge  Short Term Goals: Ability to identify changes in lifestyle to reduce recurrence of condition will improve, Ability to verbalize feelings will improve, Ability to disclose and discuss suicidal ideas, Ability to demonstrate self-control will improve, Ability to identify and develop effective coping behaviors will improve and Ability to maintain clinical measurements within normal limits will improve  I certify that inpatient services furnished can reasonably be expected to improve the patient's condition.    Craige Cotta, MD 5/17/20193:00 PM

## 2017-05-27 NOTE — Progress Notes (Signed)
1:1  D: Pt currently sitting in wheelchair with sitter. Respirations even and unlabored. Sitter within arm's reach of patient.  A: Visual assessment of patient and environmental safety completed.  R: Pt remains safe on a 1:1 per MD orders. Pt not in distress. Will continue to monitor.

## 2017-05-27 NOTE — Progress Notes (Signed)
Adult Psychoeducational Group Note  Date:  05/27/2017 Time:  9:04 PM  Group Topic/Focus:  Wrap-Up Group:   The focus of this group is to help patients review their daily goal of treatment and discuss progress on daily workbooks.  Participation Level:  Did Not Attend      Gerrit Heck 05/27/2017, 9:04 PM

## 2017-05-27 NOTE — Progress Notes (Addendum)
Patient ID: Amanda Hall, female   DOB: 03/06/1950, 67 y.o.   MRN: [id number]  1:1-   D: Pt currently lying in bed with eyes closed. Respirations even and unlabored. Sitter is currently within arms reach of patient.  A: Visual assessment of patient and environmental safety completed. Sitter encouraged to share any pertinent observations. None to note. R: Pt remains safe on a 1:1 per MD orders. Pt in no current distress. Will continue to monitor.
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
## 2017-05-27 NOTE — Progress Notes (Signed)
NUTRITION ASSESSMENT  Pt identified as at risk on the Malnutrition Screen Tool  INTERVENTION: 1. Educated patient on the importance of nutrition and encouraged intake of food and beverages. 2. Discussed weight goals. 3. Supplements: continue Ensure Enlive BID, each supplement provides 350 kcal and 20 grams of protein.  NUTRITION DIAGNOSIS: Unintentional weight loss related to sub-optimal intake as evidenced by pt report.   Goal: Pt to meet >/= 90% of their estimated nutrition needs.  Monitor:  PO intake  Assessment:  Pt admitted for severe anxiety and depression. She reports poor sleeping habits, only able to sleep a few hours at a time, weakness requiring the use of a wheelchair to get around, unable to stand to shower or transfer in and out of the bathtub, using baby wipes for bathing, and poor appetite and intakes.  Pt reports 20 lb weight loss in the past 1 month. While this is reflected in the chart, unsure of accuracy of this as weight on 4/7 and 5/16 were both patient-reported weights.   Ensure Enlive was ordered BID per ONS protocol at the time of admission. Continue to encourage PO intakes of meals, supplements, and snacks.    67 y.o. female  Height: Ht Readings from Last 1 Encounters:  05/26/17  (1.702 m)    Weight: Wt Readings from Last 1 Encounters:  05/26/17 115 lb (52.2 kg)    Weight Hx: Wt Readings from Last 10 Encounters:  05/26/17 115 lb (52.2 kg)  04/17/17 135 lb (61.2 kg)  03/22/17 138 lb (62.6 kg)  07/13/16 137 lb 3.2 oz (62.2 kg)  02/10/15 130 lb (59 kg)  12/30/14 134 lb (60.8 kg)  09/21/10 173 lb (78.5 kg)    BMI:  Body mass index is 18.01 kg/m. Pt meets criteria for underweight based on current BMI.  Estimated Nutritional Needs: Kcal: 25-30 kcal/kg Protein: > 1 gram protein/kg Fluid: 1 ml/kcal  Diet Order:  Diet Order           Diet Heart Room service appropriate? Yes; Fluid consistency: Thin  Diet effective now         Pt  is also offered choice of unit snacks mid-morning and mid-afternoon.  Pt is eating as desired.   Lab results and medications reviewed.     Trenton Gammon, MS, RD, LDN, [facility] Inpatient Clinical Dietitian Pager # [phone] After hours/weekend pager # [phone]

## 2017-05-27 NOTE — [facility] Group Notes (Signed)
LCSW Group Therapy Note 05/27/2017 2:17 PM  Type of Therapy and Topic: Group Therapy: Avoiding Self-Sabotaging and Enabling Behaviors  Participation Level: Did Not Attend  Description of Group:  In this group, patients will learn how to identify obstacles, self-sabotaging and enabling behaviors, as well as: what are they, why do we do them and what needs these behaviors meet. Discuss unhealthy relationships and how to have positive healthy boundaries with those that sabotage and enable. Explore aspects of self-sabotage and enabling in yourself and how to limit these self-destructive behaviors in everyday life.  Therapeutic Goals: 1. Patient will identify one obstacle that relates to self-sabotage and enabling behaviors 2. Patient will identify one personal self-sabotaging or enabling behavior they did prior to admission 3. Patient will state a plan to change the above identified behavior 4. Patient will demonstrate ability to communicate their needs through discussion and/or role play.   Summary of Patient Progress:  Invited, chose not to attend.    Therapeutic Modalities:  Cognitive Behavioral Therapy Person-Centered Therapy Motivational Interviewing   Baldo Daub LCSWA Clinical Social Worker

## 2017-05-27 NOTE — Plan of Care (Signed)
Patient remains fearful and nervous about anxiety and medications. Support and encouragement given.  Problem: Education: Goal: Emotional status will improve Outcome: Not Progressing

## 2017-05-27 NOTE — Tx Team (Signed)
Interdisciplinary Treatment and Diagnostic Plan Update  05/27/2017 Time of Session: 10:20am Amanda Hall MRN: [id number]  Principal Diagnosis: <principal problem not specified>  Secondary Diagnoses: Active Problems:   Major depressive disorder, recurrent severe without psychotic features ([place])   Current Medications:  Current Facility-Administered Medications  Medication Dose Route Frequency Provider Last Rate Last Dose  . acetaminophen (TYLENOL) tablet 650 mg  650 mg Oral Q6H PRN Patrecia Pour, NP   650 mg at 05/27/17 0954  . alum & mag hydroxide-simeth (MAALOX/MYLANTA) 200-200-20 MG/5ML suspension 30 mL  30 mL Oral Q4H PRN Patrecia Pour, NP      . citalopram (CELEXA) tablet 10 mg  10 mg Oral Daily Patrecia Pour, NP   10 mg at 05/27/17 0820  . feeding supplement (ENSURE ENLIVE) (ENSURE ENLIVE) liquid 237 mL  237 mL Oral BID BM Cobos, Myer Peer, MD   237 mL at 05/27/17 0827  . gabapentin (NEURONTIN) capsule 100 mg  100 mg Oral TID Patrecia Pour, NP   100 mg at 05/27/17 1254  . magnesium hydroxide (MILK OF MAGNESIA) suspension 30 mL  30 mL Oral Daily PRN Patrecia Pour, NP      . mirtazapine (REMERON) tablet 15 mg  15 mg Oral QHS Patrecia Pour, NP   15 mg at 05/26/17 2142  . potassium chloride (KLOR-CON) packet 40 mEq  40 mEq Oral Once Patrecia Pour, NP       PTA Medications: Medications Prior to Admission  Medication Sig Dispense Refill Last Dose  . atorvastatin (LIPITOR) 20 MG tablet TAKE 1 TABLET BY MOUTH ONCE DAILY 90 tablet 1 Past Week at Unknown time  . FLUoxetine (PROZAC) 20 MG capsule Take 1 capsule (20 mg total) by mouth daily. 30 capsule 0 Past Week at Unknown time  . gabapentin (NEURONTIN) 100 MG capsule Take 1 capsule (100 mg total) by mouth 3 (three) times daily. 90 capsule 0 Past Week at Unknown time  . hydrocortisone cream 1 % Apply to affected area 2 times daily 15 g 0 Past Month at Unknown time  . LORazepam (ATIVAN) 1 MG tablet Take 1 tablet (1 mg total) by  mouth 2 (two) times daily. 60 tablet 3 Past Week at Unknown time  . polyethylene glycol (MIRALAX) packet Take 17 g by mouth 2 (two) times daily. 28 each 0 Past Month at Unknown time    Patient Stressors: Financial difficulties Health problems  Patient Strengths: General fund of knowledge Supportive family/friends  Treatment Modalities: Medication Management, Group therapy, Case management,  1 to 1 session with clinician, Psychoeducation, Recreational therapy.   Physician Treatment Plan for Primary Diagnosis: <principal problem not specified> Long Term Goal(s):     Short Term Goals:    Medication Management: Evaluate patient's response, side effects, and tolerance of medication regimen.  Therapeutic Interventions: 1 to 1 sessions, Unit Group sessions and Medication administration.  Evaluation of Outcomes: Not Met  Physician Treatment Plan for Secondary Diagnosis: Active Problems:   Major depressive disorder, recurrent severe without psychotic features ([place])  Long Term Goal(s):     Short Term Goals:       Medication Management: Evaluate patient's response, side effects, and tolerance of medication regimen.  Therapeutic Interventions: 1 to 1 sessions, Unit Group sessions and Medication administration.  Evaluation of Outcomes: Not Met   RN Treatment Plan for Primary Diagnosis: <principal problem not specified> Long Term Goal(s): Knowledge of disease and therapeutic regimen to maintain health will improve  Short Term Goals:  Ability to verbalize frustration and anger appropriately will improve, Ability to participate in decision making will improve, Ability to verbalize feelings will improve, Ability to identify and develop effective coping behaviors will improve and Compliance with prescribed medications will improve  Medication Management: RN will administer medications as ordered by provider, will assess and evaluate patient's response and provide education to patient for  prescribed medication. RN will report any adverse and/or side effects to prescribing provider.  Therapeutic Interventions: 1 on 1 counseling sessions, Psychoeducation, Medication administration, Evaluate responses to treatment, Monitor vital signs and CBGs as ordered, Perform/monitor CIWA, COWS, AIMS and Fall Risk screenings as ordered, Perform wound care treatments as ordered.  Evaluation of Outcomes: Not Met   LCSW Treatment Plan for Primary Diagnosis: <principal problem not specified> Long Term Goal(s): Safe transition to appropriate next level of care at discharge, Engage patient in therapeutic group addressing interpersonal concerns.  Short Term Goals: Engage patient in aftercare planning with referrals and resources, Increase social support, Increase ability to appropriately verbalize feelings, Increase emotional regulation, Facilitate acceptance of mental health diagnosis and concerns, Facilitate patient progression through stages of change regarding substance use diagnoses and concerns, Identify triggers associated with mental health/substance abuse issues and Increase skills for wellness and recovery  Therapeutic Interventions: Assess for all discharge needs, 1 to 1 time with Social worker, Explore available resources and support systems, Assess for adequacy in community support network, Educate family and significant other(s) on suicide prevention, Complete Psychosocial Assessment, Interpersonal group therapy.  Evaluation of Outcomes: Not Met   Progress in Treatment: Attending groups: No. Participating in groups: No. Taking medication as prescribed: Yes. Toleration medication: Yes. Family/Significant other contact made: No, will contact:  the patient's sister Patient understands diagnosis: Yes. Discussing patient identified problems/goals with staff: Yes. Medical problems stabilized or resolved: Yes. Denies suicidal/homicidal ideation: Yes. Issues/concerns per patient  self-inventory: No. Other:   New problem(s) identified: None   New Short Term/Long Term Goal(s):medication stabilization, elimination of SI thoughts, development of comprehensive mental wellness plan.   Patient Goals: "I want help with my anxiety. It is to a point where it is overwhelming and I worry about everything".   Discharge Plan or Barriers: CSW will continue to assess for an appropriate discharge plan.   Reason for Continuation of Hospitalization: Anxiety Depression Medication stabilization  Estimated Length of Stay: Wednesday, 06/01/2017  Attendees: Patient: Amanda Hall 05/27/2017 1:18 PM  Physician: Dr. Neita Garnet, MD 05/27/2017 1:18 PM  Nursing: Opal Sidles.Jenetta Downer RN 05/27/2017 1:18 PM  RN Care Manager:X 05/27/2017 1:18 PM  Social Worker: Radonna Ricker, [place] 05/27/2017 1:18 PM  Recreational Therapist: X 05/27/2017 1:18 PM  Other: X 05/27/2017 1:18 PM  Other: X 05/27/2017 1:18 PM  Other:X 05/27/2017 1:18 PM    Scribe for Treatment Team: Marylee Floras, [place] 05/27/2017 1:18 PM

## 2017-05-27 NOTE — Progress Notes (Signed)
Patient ID: Amanda Hall, female   DOB: 25-Jun-1950, 67 y.o.   MRN: [id number] 1:1 Note Pt observed in room seated on her W/C. Pt complained of severe anxiety; "I get anxious all the time but I don't know why; I don't think people understand me" Pt also complained of moderate depression. Pt continually denied SI/HI or pain. Pt also denies AVH. Pt does not look to be in any distress at this time. 1:1 staff is present in room with Pt at this time. 1:1 monitoring continues for Pt's safety. 15-minute safety checks also continues at this time. Did not attend wrap-up group.

## 2017-05-27 NOTE — Progress Notes (Signed)
1:1  D:Patient currently sitting in wheelchair with sitter. Respirations even and unlabored. Sitter is within arm's reach of patient.  A: Visual assessment of patient and environmental safety completed. R: Patient remains safe on a 1:1 per MD orders. Patient is not in distress. Will continue to monitor.

## 2017-05-28 LAB — BASIC METABOLIC PANEL
ANION GAP: 13 (ref 5–15)
BUN: 39 mg/dL — AB (ref 6–20)
CO2: 31 mmol/L (ref 22–32)
Calcium: 10 mg/dL (ref 8.9–10.3)
Chloride: 101 mmol/L (ref 101–111)
Creatinine, Ser: 1.34 mg/dL — ABNORMAL HIGH (ref 0.44–1.00)
GFR calc Af Amer: 47 mL/min — ABNORMAL LOW (ref >60)
GFR, EST NON AFRICAN AMERICAN: 41 mL/min — AB (ref >60)
GLUCOSE: 92 mg/dL (ref 65–99)
POTASSIUM: 3.6 mmol/L (ref 3.5–5.1)
SODIUM: 145 mmol/L (ref 135–145)

## 2017-05-28 LAB — TSH: TSH: 1.538 u[IU]/mL (ref 0.350–4.500)

## 2017-05-28 MED ORDER — OXCARBAZEPINE 150 MG PO TABS
150.0000 mg | ORAL_TABLET | Freq: Two times a day (BID) | ORAL | Status: DC
  Administered 2017-05-28 – 2017-05-29 (×3): 150 mg via ORAL
  Filled 2017-05-28 (×6): qty 1

## 2017-05-28 MED ORDER — LORAZEPAM 1 MG PO TABS
1.0000 mg | ORAL_TABLET | Freq: Three times a day (TID) | ORAL | Status: DC
  Administered 2017-05-28 – 2017-06-09 (×37): 1 mg via ORAL
  Filled 2017-05-28 (×39): qty 1

## 2017-05-28 MED ORDER — MIRTAZAPINE 30 MG PO TABS
30.0000 mg | ORAL_TABLET | Freq: Every day | ORAL | Status: DC
  Administered 2017-05-28 – 2017-05-30 (×3): 30 mg via ORAL
  Filled 2017-05-28 (×5): qty 1

## 2017-05-28 NOTE — Progress Notes (Signed)
D: Patient continuing to be monitored 1:1. Free of self harm. Difficulty with medication compliance, requiring lots of encouragement to take medications. Continues to have severe anxiety. Still unstable A: Provided frequent encouragement, 15 minute checks reviewed. R: Patient medication compliant, exhibiting good safety awareness.

## 2017-05-28 NOTE — Progress Notes (Signed)
D: Patient is unsteady on her feet, requires assistance with ADLs, and is impulsive. Patient having difficult time communicating her needs, as she displays severe anxiety and disorganized speech. Patient denies suicidal thoughts at this time. A: Provided support and encouragement, ensured 1:1 continues for patient safety, nonskid footwear, wheelchair. Spent long time encouraging to take medications as prescribed and offering a listening ear. R: Patient continues to remain guarded, but denies SI.

## 2017-05-28 NOTE — Progress Notes (Signed)
Patient ID: Amanda Hall, female   DOB: [birth year], 67 y.o.   MRN: [id number] 1:1 Note  Pt at this time is in bed resting with eyes closed. Pt does not look to be in any distress at this time. 1:1 staff is present in room with Pt at this time. 1:1 monitoring continues for Pt's safety. 15-minute safety checks also continues at this time.

## 2017-05-28 NOTE — Progress Notes (Signed)
1:1 NSG note;  D.  Pt sitting in hall on phone, no distress noted.  Pt has been primarily on phone or in room thus far this shift.  Pt denies SI/HI/AVH at this time.  A.  Support and encouragement offered, medication given as ordered.  R.  Pt remains safe on 1:1 as ordered, will continue to monitor.

## 2017-05-28 NOTE — Progress Notes (Signed)
D: Patient continues to need assistance with ADLS as well as support and encouragement. A: Patient 1:1 continued, wheelchair in place, help with ADLs offered. R: Patient denies SI

## 2017-05-28 NOTE — Progress Notes (Signed)
D: Patient presents depressed, withdrawn, paranoid, and extremely anxious. Patient kept stating "I wish I was able to express what I was thinking." Patient was tremulous throughout the morning, and guarded. It took about 15 minutes for her to agree to take her morning meds, and she was unable to explain why she was hesitating to take them. Patient reports poor sleep. Still a high fall risk, as her gait is unsteady and she requires assistance with ADLs. Patient scored 15 on CIWA for tremor and anxiety, with some clouding of sensorium. Patient denies SI/HI/AVH.  A: Patient checked q15 min, and checks reviewed. Reviewed medication changes with patient and educated on side effects. Encouarged patient to focus on medication compliance as a starting goal for today. Provided support and encouragement. Educated on fall risk prevention. 1:1 maintained. R: Patient receptive to education on medications, and is medication compliant. Patient isolates to room, not participating in group therapy or going to the cafeteria. Patient requesting and receiving Ensure as ordered. Patient contracts for safety on the unit. Patient did not fill out a self inventory, and reports feeling too anxious to attend group or the cafeteria.

## 2017-05-28 NOTE — Plan of Care (Signed)
Patient is free of self harm on the unit, denies Si. However, patient is severely anxious, and appears depressed. Patient requires frequent encouragement to take medications as prescribed.

## 2017-05-28 NOTE — Progress Notes (Signed)
D: Patient c/o CP, described as mild pressure. Patient is not dizzy, lightheaded or disoriented. Patient has no GI symptoms. Skin is pink, dry, and warm.  A: Obtained vitals 118/62 and 92 pulse. Appears to be related to anxiety. Offered encouragement and support. R: Patient agrees to report increasing symptoms of pain, pressure, diaphoresis or SOB.

## 2017-05-28 NOTE — Progress Notes (Signed)
[facility] MD Progress Note  05/28/2017 9:41 AM Amanda Hall  MRN:  [id number] Subjective: Patient seen and examined.  Patient is a 67 year old female with a past psychiatric history significant for generalized anxiety disorder, major depression who was admitted on 05/27/2017 because of worsening anxiety.  Patient appears to be significantly anxious still this a.m.  She is alert and oriented x3.  But otherwise unable to really vocalize what is going on with her.  Review of the controlled substance database shows that she is been on Ativan basically receiving it monthly since at least 06/17/2015.  She has been on 0.5 mg 90 tablets a month at times, and was increased to lorazepam 1 mg p.o. twice daily in July 2018.  It has been continued since then.  I do not see any other controlled substances in the database.  She was placed on Remeron last night, mainly for depression, anxiety as well as weight loss.  She did receive Neurontin, but it does not appear to have had any benefit so far on her anxiety. Principal Problem: <principal problem not specified> Diagnosis:   Patient Active Problem List   Diagnosis Date Noted  . Generalized anxiety disorder [F41.1] 05/26/2017  . Major depressive disorder, recurrent severe without psychotic features ([place]) [F33.2] 05/26/2017  . Hypercholesterolemia [E78.00] 03/22/2017  . Nonspecific (abnormal) findings on radiological and other examination of body structure [793] 12/26/2006  . NONSPECIFIC ABNORM FIND RAD&OTH EXAM LUNG FIELD [R93.0] 12/26/2006  . ANEMIA-IRON DEFICIENCY [D50.9] 12/15/2006  . Depression [F32.9] 12/15/2006  . Allergic rhinitis [J30.9] 12/15/2006   Total Time spent with patient: 30 minutes  Past Psychiatric History: See admission H&P  Past Medical History:  Past Medical History:  Diagnosis Date  . Allergy   . Anemia   . Anxiety   . Depression   . Hemorrhoids     Past Surgical History:  Procedure Laterality Date  . arm surgery     Left  . TUBAL  LIGATION     Family History:  Family History  Problem Relation Age of Onset  . Stroke Mother   . Hypertension Sister   . Cancer Neg Hx        breat and colon hx  . Diabetes Neg Hx        family   Family Psychiatric  History: See admission H&P Social History:  Social History   Substance and Sexual Activity  Alcohol Use No     Social History   Substance and Sexual Activity  Drug Use No    Social History   Socioeconomic History  . Marital status: Divorced    Spouse name: Not on file  . Number of children: Not on file  . Years of education: Not on file  . Highest education level: Not on file  Occupational History  . Not on file  Social Needs  . Financial resource strain: Not on file  . Food insecurity:    Worry: Not on file    Inability: Not on file  . Transportation needs:    Medical: Not on file    Non-medical: Not on file  Tobacco Use  . Smoking status: Never Smoker  . Smokeless tobacco: Never Used  Substance and Sexual Activity  . Alcohol use: No  . Drug use: No  . Sexual activity: Not Currently  Lifestyle  . Physical activity:    Days per week: Not on file    Minutes per session: Not on file  . Stress: Not on file  Relationships  .  Social connections:    Talks on phone: Not on file    Gets together: Not on file    Attends religious service: Not on file    Active member of club or organization: Not on file    Attends meetings of clubs or organizations: Not on file    Relationship status: Not on file  Other Topics Concern  . Not on file  Social History Narrative  . Not on file   Additional Social History:                         Sleep: Fair  Appetite:  Poor  Current Medications: Current Facility-Administered Medications  Medication Dose Route Frequency Provider Last Rate Last Dose  . acetaminophen (TYLENOL) tablet 650 mg  650 mg Oral Q6H PRN Patrecia Pour, NP   650 mg at 05/27/17 0954  . alum & mag hydroxide-simeth  (MAALOX/MYLANTA) 200-200-20 MG/5ML suspension 30 mL  30 mL Oral Q4H PRN Patrecia Pour, NP      . feeding supplement (ENSURE ENLIVE) (ENSURE ENLIVE) liquid 237 mL  237 mL Oral BID BM Cobos, Myer Peer, MD   237 mL at 05/28/17 0932  . LORazepam (ATIVAN) tablet 1 mg  1 mg Oral TID Sharma Covert, MD      . magnesium hydroxide (MILK OF MAGNESIA) suspension 30 mL  30 mL Oral Daily PRN Patrecia Pour, NP      . mirtazapine (REMERON) tablet 30 mg  30 mg Oral QHS Sharma Covert, MD      . OXcarbazepine (TRILEPTAL) tablet 150 mg  150 mg Oral BID Sharma Covert, MD      . potassium chloride (KLOR-CON) packet 40 mEq  40 mEq Oral Once Patrecia Pour, NP        Lab Results:  Results for orders placed or performed during the hospital encounter of 05/26/17 (from the past 48 hour(s))  TSH     Status: None   Collection Time: 05/28/17  6:13 AM  Result Value Ref Range   TSH 1.538 0.350 - 4.500 uIU/mL    Comment: Performed by a 3rd Generation assay with a functional sensitivity of <=0.01 uIU/mL. Performed at [facility], [place] [address], [place], [place] [address]   Basic metabolic panel     Status: Abnormal   Collection Time: 05/28/17  6:30 AM  Result Value Ref Range   Sodium 145 135 - 145 mmol/L   Potassium 3.6 3.5 - 5.1 mmol/L   Chloride 101 101 - 111 mmol/L   CO2 31 22 - 32 mmol/L   Glucose, Bld 92 65 - 99 mg/dL   BUN 39 (H) 6 - 20 mg/dL   Creatinine, Ser 1.34 (H) 0.44 - 1.00 mg/dL   Calcium 10.0 8.9 - 10.3 mg/dL   GFR calc non Af Amer 41 (L) >60 mL/min   GFR calc Af Amer 47 (L) >60 mL/min    Comment: (NOTE) The eGFR has been calculated using the CKD EPI equation. This calculation has not been validated in all clinical situations. eGFR's persistently <60 mL/min signify possible Chronic Kidney Disease.    Anion gap 13 5 - 15    Comment: Performed at [facility], [place] [address] [place],  [address]    Blood Alcohol level:  Lab  Results  Component Value Date   ETH <10 05/25/2017   ETH <10 10/03/3005    Metabolic Disorder Labs: No results  found for: HGBA1C, MPG No results found for: PROLACTIN Lab Results  Component Value Date   CHOL 189 03/22/2017   TRIG 218.0 (H) 03/22/2017   HDL 50.60 03/22/2017   CHOLHDL 4 03/22/2017   VLDL 43.6 (H) 03/22/2017   LDLCALC 226 (H) 07/13/2016    Physical Findings: AIMS: Facial and Oral Movements Muscles of Facial Expression: None, normal Lips and Perioral Area: None, normal Jaw: None, normal Tongue: None, normal,Extremity Movements Upper (arms, wrists, hands, fingers): None, normal Lower (legs, knees, ankles, toes): None, normal, Trunk Movements Neck, shoulders, hips: None, normal, Overall Severity Severity of abnormal movements (highest score from questions above): None, normal Incapacitation due to abnormal movements: None, normal Patient's awareness of abnormal movements (rate only patient's report): No Awareness, Dental Status Current problems with teeth and/or dentures?: Yes Does patient usually wear dentures?: Yes  CIWA:    COWS:     Musculoskeletal: Strength & Muscle Tone: decreased Gait & Station: unsteady Patient leans: N/A  Psychiatric Specialty Exam: Physical Exam  Nursing note and vitals reviewed. Constitutional: She is oriented to person, place, and time. She appears well-developed.  HENT:  Head: Normocephalic and atraumatic.  Respiratory: Effort normal.  Neurological: She is alert and oriented to person, place, and time.    ROS  Blood pressure (!) 96/51, pulse 97, temperature 98 F (36.7 C), temperature source Oral, resp. rate 20, height 5' 7" (1.702 m), weight 52.2 kg (115 lb).Body mass index is 18.01 kg/m.  General Appearance: Disheveled  Eye Contact:  Minimal  Speech:  Blocked  Volume:  Decreased  Mood:  Anxious  Affect:  Congruent  Thought Process:  Coherent  Orientation:  Full (Time, Place, and Person)  Thought Content:  Logical   Suicidal Thoughts:  No  Homicidal Thoughts:  No  Memory:  Immediate;   Fair  Judgement:  Impaired  Insight:  Lacking  Psychomotor Activity:  Increased  Concentration:  Concentration: Poor  Recall:  Poor  Fund of Knowledge:  Fair  Language:  Fair  Akathisia:  Negative  Handed:  Right  AIMS (if indicated):     Assets:  Desire for Improvement  ADL's:  Intact  Cognition:  WNL  Sleep:  Number of Hours: 4.75     Treatment Plan Summary: Daily contact with patient to assess and evaluate symptoms and progress in treatment, Medication management and Plan Patient is seen and examined.  Patient is a 67 year old female with the above-stated past psychiatric history seen in follow-up.  She still remains significantly anxious.  There is some concern for withdrawal and whether she has been taking more Ativan than she had been prescribed.  I am going to go on increase her dosage up to 1 mg p.o. 3 times daily, and then try to wean it down from there if that stabilizes things.  I do not think the Neurontin has been of any benefit, and I am going to put her on some Trileptal.  Will started 150 mg p.o. twice daily.  I am also going to increase her Remeron to 30 mg p.o. nightly.  Hopefully this will slow down some of her anxiety and we get a better idea of what else going on.  Sharma Covert, MD 05/28/2017, 9:41 AM

## 2017-05-28 NOTE — [facility] Group Notes (Signed)
[facility] Group Notes: (Clinical Social Work)   05/28/2017      Type of Therapy:  Group Therapy   Participation Level:  Did Not Attend despite MHT prompting   Shellia Cleverly, LCSW  05/28/2017 12:54 PM

## 2017-05-29 LAB — VITAMIN B12: VITAMIN B 12: 582 pg/mL (ref 180–914)

## 2017-05-29 LAB — FOLATE: FOLATE: 27 ng/mL (ref >5.9)

## 2017-05-29 MED ORDER — OXCARBAZEPINE 150 MG PO TABS
75.0000 mg | ORAL_TABLET | Freq: Two times a day (BID) | ORAL | Status: DC
  Filled 2017-05-29 (×3): qty 0.5

## 2017-05-29 MED ORDER — FLUOXETINE HCL 10 MG PO CAPS
10.0000 mg | ORAL_CAPSULE | Freq: Every day | ORAL | Status: DC
  Administered 2017-05-29 – 2017-05-31 (×3): 10 mg via ORAL
  Filled 2017-05-29 (×5): qty 1

## 2017-05-29 MED ORDER — OXCARBAZEPINE 150 MG PO TABS
75.0000 mg | ORAL_TABLET | Freq: Two times a day (BID) | ORAL | Status: DC
  Administered 2017-05-29 – 2017-06-09 (×22): 75 mg via ORAL
  Filled 2017-05-29 (×5): qty 0.5
  Filled 2017-05-29: qty 1
  Filled 2017-05-29: qty 0.5
  Filled 2017-05-29 (×2): qty 1
  Filled 2017-05-29 (×20): qty 0.5

## 2017-05-29 MED ORDER — OXCARBAZEPINE 150 MG PO TABS
150.0000 mg | ORAL_TABLET | Freq: Every day | ORAL | Status: DC
  Administered 2017-05-29 – 2017-05-30 (×2): 150 mg via ORAL
  Filled 2017-05-29 (×4): qty 1

## 2017-05-29 NOTE — Progress Notes (Signed)
1:1      `D Pt remains on 1:1 due to poor nutritional intake coupled with pt 's high fall risk status and generalized weak state.      A Pt is seen OOB in wheelchair-tolerated fair. She is assisted by 1:1 MHT, taken out in the halls into the dayroom, traveling out into the halls of the hospital  as well as cafe'. She remains acutely depressed, anxious and tremulous, despite taking her scheduled ativan and trileptal.      R Safety is in place. Pt remains continuing to ruminate / obsess and stay stuck.

## 2017-05-29 NOTE — Progress Notes (Signed)
1:1 NSG note:  D.  Pt has rested with eyes closed, even and unlabored respirations.  No discomfort noted.  A.  1:1 continued as ordered for Pt safety  R.  Pt remains safe on the unit, will continue to monitor.

## 2017-05-29 NOTE — Progress Notes (Signed)
Patient ID: Amanda Hall, female   DOB: Jan 20, 1950, 67 y.o.   MRN: [id number] 1:1 Note Pt observed in room seated on her W/C. Pt continue to complain of moderate anxiety and depression; "I just wish I can stop being anxious." Pt denied SI/HI, AVH or pain. Pt does not look to be in any distress at this time. 1:1 staff is present in room with Pt at this time. 1:1 monitoring continues for Pt's safety. 15-minute safety checks also continues at this time.

## 2017-05-29 NOTE — Progress Notes (Signed)
1:1 NSG note:   D.  Pt resting in bed with eyes closed, respirations even and unlabored.  No distress noted.  A.  Pt continues to be monitored 1:1 as ordered for safety.  R.  Pt remains safe on the unit.

## 2017-05-29 NOTE — Progress Notes (Signed)
Adult Psychoeducational Group Note  Date:  05/29/2017 Time:  1:53 AM  Group Topic/Focus:  Wrap-Up Group:   The focus of this group is to help patients review their daily goal of treatment and discuss progress on daily workbooks.  Participation Level:  Did Not Attend  Participation Quality:  Did Not Attend  Affect:  Did Not Attend  Cognitive:  Did Not Attend  Insight: None  Engagement in Group:  Did Not Attend  Modes of Intervention:  Did Not Attend  Additional Comments:  Pt did not attend evening wrap up group tonight.  Felipa Furnace 05/29/2017, 1:53 AM

## 2017-05-29 NOTE — Progress Notes (Signed)
D Pt is observed sitting in a wheelchair at nurses' station- her 1:1 sitter standing at her side. She is quiet. She does not initiate any conversation and when this writer approaches her and requests that she take her morning medicaiton, she begins to stammer, becomes quite anxious, looking from side - to - side and she begins a quite long  And convoluted explanation about the origins of her anxiety, when it started, how it began presenting itself in her body and her behavior, how it has ruined her life, she feels, and she is full of questions to this Clinical research associate about what medications she will take to help her anxiety " go away".     A Pt agrees to take her scheduled meds as well as drink a BOOST and then she and her 1:1 sitter travel around the unit via her wheelchair. Sitter assisted pt to answer her questions on her daily assessment and she denies having SI today and she rates her feelings of anxiety a "10". Writer encouraged patient to try to focus on today..  now and pt agreed that she will try.     R Safety is in place.

## 2017-05-29 NOTE — Plan of Care (Signed)
  Problem: Education: Goal: Verbalization of understanding the information provided will improve Outcome: Not Progressing   

## 2017-05-29 NOTE — Progress Notes (Signed)
[facility] MD Progress Note  05/29/2017 10:23 AM Kanylah Muench  MRN:  [id number] Subjective: Patient is seen and examined.  Patient is a 67 year old female with a past psychiatric history significant for generalized anxiety disorder, major depression.  She is seen in follow-up.  She looks slightly better than yesterday.  She did state that the Trileptal was making her feel more drowsy.  It may be the Trileptal, or it may be the Ativan.  She is less tremulous today, and able to construct some sentences.  She still ruminates.  I am considering whether or not she may have some degree of obsessive-compulsive disorder.  She tries did talk about something to do with her previous hemorrhoid surgery.  She talks about showering, and perhaps cleaning, but cannot get the thoughts out.  She still remains alert and oriented to 3. Principal Problem: <principal problem not specified> Diagnosis:   Patient Active Problem List   Diagnosis Date Noted  . Generalized anxiety disorder [F41.1] 05/26/2017  . Major depressive disorder, recurrent severe without psychotic features ([place]) [F33.2] 05/26/2017  . Hypercholesterolemia [E78.00] 03/22/2017  . Nonspecific (abnormal) findings on radiological and other examination of body structure [793] 12/26/2006  . NONSPECIFIC ABNORM FIND RAD&OTH EXAM LUNG FIELD [R93.0] 12/26/2006  . ANEMIA-IRON DEFICIENCY [D50.9] 12/15/2006  . Depression [F32.9] 12/15/2006  . Allergic rhinitis [J30.9] 12/15/2006   Total Time spent with patient: 20 minutes  Past Psychiatric History: See admission H&P  Past Medical History:  Past Medical History:  Diagnosis Date  . Allergy   . Anemia   . Anxiety   . Depression   . Hemorrhoids     Past Surgical History:  Procedure Laterality Date  . arm surgery     Left  . TUBAL LIGATION     Family History:  Family History  Problem Relation Age of Onset  . Stroke Mother   . Hypertension Sister   . Cancer Neg Hx        breat and colon hx  . Diabetes Neg  Hx        family   Family Psychiatric  History: See admission H&P Social History:  Social History   Substance and Sexual Activity  Alcohol Use No     Social History   Substance and Sexual Activity  Drug Use No    Social History   Socioeconomic History  . Marital status: Divorced    Spouse name: Not on file  . Number of children: Not on file  . Years of education: Not on file  . Highest education level: Not on file  Occupational History  . Not on file  Social Needs  . Financial resource strain: Not on file  . Food insecurity:    Worry: Not on file    Inability: Not on file  . Transportation needs:    Medical: Not on file    Non-medical: Not on file  Tobacco Use  . Smoking status: Never Smoker  . Smokeless tobacco: Never Used  Substance and Sexual Activity  . Alcohol use: No  . Drug use: No  . Sexual activity: Not Currently  Lifestyle  . Physical activity:    Days per week: Not on file    Minutes per session: Not on file  . Stress: Not on file  Relationships  . Social connections:    Talks on phone: Not on file    Gets together: Not on file    Attends religious service: Not on file    Active member of  club or organization: Not on file    Attends meetings of clubs or organizations: Not on file    Relationship status: Not on file  Other Topics Concern  . Not on file  Social History Narrative  . Not on file   Additional Social History:                         Sleep: Fair  Appetite:  Fair  Current Medications: Current Facility-Administered Medications  Medication Dose Route Frequency Provider Last Rate Last Dose  . acetaminophen (TYLENOL) tablet 650 mg  650 mg Oral Q6H PRN Patrecia Pour, NP   650 mg at 05/27/17 0954  . alum & mag hydroxide-simeth (MAALOX/MYLANTA) 200-200-20 MG/5ML suspension 30 mL  30 mL Oral Q4H PRN Patrecia Pour, NP      . feeding supplement (ENSURE ENLIVE) (ENSURE ENLIVE) liquid 237 mL  237 mL Oral BID BM Cobos,  Myer Peer, MD   237 mL at 05/28/17 1303  . FLUoxetine (PROZAC) capsule 10 mg  10 mg Oral Daily Sharma Covert, MD      . LORazepam (ATIVAN) tablet 1 mg  1 mg Oral TID Sharma Covert, MD   1 mg at 05/29/17 0811  . magnesium hydroxide (MILK OF MAGNESIA) suspension 30 mL  30 mL Oral Daily PRN Patrecia Pour, NP      . mirtazapine (REMERON) tablet 30 mg  30 mg Oral QHS Sharma Covert, MD   30 mg at 05/28/17 2142  . OXcarbazepine (TRILEPTAL) tablet 150 mg  150 mg Oral QHS Sharma Covert, MD      . OXcarbazepine (TRILEPTAL) tablet 75 mg  75 mg Oral BID Cobos, Fernando A, MD      . potassium chloride (KLOR-CON) packet 40 mEq  40 mEq Oral Once Patrecia Pour, NP        Lab Results:  Results for orders placed or performed during the hospital encounter of 05/26/17 (from the past 48 hour(s))  TSH     Status: None   Collection Time: 05/28/17  6:13 AM  Result Value Ref Range   TSH 1.538 0.350 - 4.500 uIU/mL    Comment: Performed by a 3rd Generation assay with a functional sensitivity of <=0.01 uIU/mL. Performed at [facility], [place] [address], [place], [place] [address]   Vitamin B12     Status: None   Collection Time: 05/28/17  6:13 AM  Result Value Ref Range   Vitamin B-12 582 180 - 914 pg/mL    Comment: (NOTE) This assay is not validated for testing neonatal or myeloproliferative syndrome specimens for Vitamin B12 levels. Performed at [facility], [place] [address], [place], [place] [address]   Folate     Status: None   Collection Time: 05/28/17  6:13 AM  Result Value Ref Range   Folate 27.0 >5.9 ng/mL    Comment: Performed at [place] [address], [place],  [address]  Basic metabolic panel     Status: Abnormal   Collection Time: 05/28/17  6:30 AM  Result Value Ref Range   Sodium 145 135 - 145 mmol/L   Potassium 3.6 3.5 - 5.1 mmol/L   Chloride 101 101 - 111 mmol/L   CO2 31 22 - 32 mmol/L   Glucose, Bld 92 65 - 99 mg/dL    BUN 39 (H) 6 - 20 mg/dL   Creatinine, Ser 1.34 (H) 0.44 -  1.00 mg/dL   Calcium 10.0 8.9 - 10.3 mg/dL   GFR calc non Af Amer 41 (L) >60 mL/min   GFR calc Af Amer 47 (L) >60 mL/min    Comment: (NOTE) The eGFR has been calculated using the CKD EPI equation. This calculation has not been validated in all clinical situations. eGFR's persistently <60 mL/min signify possible Chronic Kidney Disease.    Anion gap 13 5 - 15    Comment: Performed at [facility], [place] [address], [place], [place] [address]    Blood Alcohol level:  Lab Results  Component Value Date   ETH <10 05/25/2017   ETH <10 73/71/0626    Metabolic Disorder Labs: No results found for: HGBA1C, MPG No results found for: PROLACTIN Lab Results  Component Value Date   CHOL 189 03/22/2017   TRIG 218.0 (H) 03/22/2017   HDL 50.60 03/22/2017   CHOLHDL 4 03/22/2017   VLDL 43.6 (H) 03/22/2017   LDLCALC 226 (H) 07/13/2016    Physical Findings: AIMS: Facial and Oral Movements Muscles of Facial Expression: Minimal Lips and Perioral Area: Minimal Jaw: None, normal Tongue: None, normal,Extremity Movements Upper (arms, wrists, hands, fingers): Moderate Lower (legs, knees, ankles, toes): Mild, Trunk Movements Neck, shoulders, hips: Mild, Overall Severity Severity of abnormal movements (highest score from questions above): Moderate Incapacitation due to abnormal movements: Moderate Patient's awareness of abnormal movements (rate only patient's report): Aware, mild distress, Dental Status Current problems with teeth and/or dentures?: Yes Does patient usually wear dentures?: Yes  CIWA:  CIWA-Ar Total: 9 COWS:     Musculoskeletal: Strength & Muscle Tone: decreased Gait & Station: unable to stand Patient leans: N/A  Psychiatric Specialty Exam: Physical Exam  ROS  Blood pressure 106/74, pulse 88, temperature 97.7 F (36.5 C), temperature source Oral, resp. rate 16, height 5' 7"  (1.702 m), weight 52.2 kg  (115 lb).Body mass index is 18.01 kg/m.  General Appearance: Disheveled  Eye Contact:  Fair  Speech:  Blocked  Volume:  Decreased  Mood:  Anxious and Depressed  Affect:  Congruent  Thought Process:  Disorganized  Orientation:  Full (Time, Place, and Person)  Thought Content:  Rumination  Suicidal Thoughts:  Yes.  without intent/plan  Homicidal Thoughts:  No  Memory:  Immediate;   Fair  Judgement:  Impaired  Insight:  Lacking  Psychomotor Activity:  Increased  Concentration:  Concentration: Fair  Recall:  [employer] of Knowledge:  Fair  Language:  Fair  Akathisia:  Negative  Handed:  Right  AIMS (if indicated):     Assets:  Desire for Improvement  ADL's:  Intact  Cognition:  WNL  Sleep:  Number of Hours: 6.5     Treatment Plan Summary: Daily contact with patient to assess and evaluate symptoms and progress in treatment, Medication management and Plan Patient is seen and examined.  Patient is a 67 year old female with the above-stated past psychiatric history seen in follow-up.  She may be slightly better today.  She is able to construct some thoughts and talk with me a bit.  She began to ruminate about something from her previous hemorrhoid surgery this year.  She could not get it out as to whether or not she wanted to take a bath or shower, or she did not.  It was unclear whether she wanted wipes to clean herself, or have others clean her.  She did complain of some daytime sedation, so I am in a reduced the Trileptal to 75 during the day,  but continue the 150 at bedtime.  We also discussed previous medications that she felt was somewhat helpful.  She remembered fluoxetine, Soma put her on 10 mg today to go with the Trileptal as well as the Remeron.  She did eat a little bit better today, we will just have to see how things go.  I did order daily weights to make sure that she is not losing any additional weight while in the hospital.  Sharma Covert, MD 05/29/2017, 10:23 AM

## 2017-05-29 NOTE — Progress Notes (Signed)
Late Entry:     D Pt is observed sitting in her wheelchair in her room, accompanied y 1:1 sitter. She has her personal care products draped across her lap, the other bed in her room and her bed ( DEPENDS, soap, washcloths and towels, clean clothes, hairbrush, deodorant)etc. She fingers the edge of a disposable pair of underwear as she talks with this Clinical research associate.     A She says" I don't know what to do..[birth year] you think I should get in that big bathtub?". Writer spoke with pt about bathing in handicapped bathroom and pt is ruminating care of hemorrhoids ( after having bands placed recently). Writer encouraged pt to allow staff to assist with ADLS's.     R Safety in place and 1:1 cont.

## 2017-05-29 NOTE — [facility] Group Notes (Signed)
[facility] LCSW Group Therapy Note  05/29/2017  10:00-11:00AM  Type of Therapy and Topic:  Group Therapy:  Building Supports  Participation Level:  Minimal   Description of Group:  Patients in this group were introduced to the idea of adding a variety of healthy supports to address the various needs in their lives.  Different types of support were defined and described, and patients were asked to act out what each type could be.  Patients discussed what additional healthy supports could be helpful in their recovery and wellness after discharge in order to prevent future hospitalizations.   An emphasis was placed on following up with the discharge plan when they leave the hospital in order to continue becoming healthier and happier.  Therapeutic Goals: 1)  demonstrate the importance of adding supports  2)  discuss 4 definitions of support  3)  identify the patient's current level of healthy support and   4)  elicit commitments to add one healthy support   Summary of Patient Progress:  The patient shared at the beginning of the session that she has her brother, sister, nephew and church friend's that are healthy supports. Patient denies unhealthy supports. Patient was present but did not engage in the remainder of the session.   Therapeutic Modalities:   Motivational Interviewing Brief Solution-Focused Therapy  Shellia Cleverly

## 2017-05-29 NOTE — Progress Notes (Signed)
Late entry for 1000:     D Pt is observed by this writer sitting in her wheelchair with 1:1 MHT sitter at her side. Pt tries to smile, painfully,will start and then  She begins  To stutter and stammer as Probation officer is scanning meds to be given her. She demonstrates some psychomotor retardation, she has diff completing  And finishing her thoughts and sentences. She is visibly consumed with anxiety. She ruminates , she jumps from one topic of anxiety to another.     A She remains nutritionally impaired and will eat tiny portions of food at one time. She remains a HIGH FALL RISK and 1:1 is in place - to maintain  Pt safety as well as assist pt with getting ehr needs met.     R Safety is in place.

## 2017-05-30 DIAGNOSIS — F411 Generalized anxiety disorder: Secondary | ICD-10-CM

## 2017-05-30 DIAGNOSIS — R630 Anorexia: Secondary | ICD-10-CM

## 2017-05-30 DIAGNOSIS — F329 Major depressive disorder, single episode, unspecified: Secondary | ICD-10-CM

## 2017-05-30 DIAGNOSIS — R638 Other symptoms and signs concerning food and fluid intake: Secondary | ICD-10-CM

## 2017-05-30 DIAGNOSIS — Z681 Body mass index (BMI) 19 or less, adult: Secondary | ICD-10-CM

## 2017-05-30 LAB — VITAMIN D 25 HYDROXY (VIT D DEFICIENCY, FRACTURES): VIT D 25 HYDROXY: 37.1 ng/mL (ref 30.0–100.0)

## 2017-05-30 MED ORDER — HYDROCORTISONE 1 % EX CREA
TOPICAL_CREAM | Freq: Two times a day (BID) | CUTANEOUS | Status: DC | PRN
  Administered 2017-05-30: 19:00:00 via TOPICAL
  Filled 2017-05-30: qty 28

## 2017-05-30 NOTE — Progress Notes (Signed)
1:1 Note 1030  Patient sitting in wheelchair in her room with 1:1 present.  Patient continues to be tremulous.  Patient stated she has been suffering with anxiety many years.  Patient had worked in hospital years ago.  Patient had been married, no children.  Stated her sister lives with her and helps her as much as she can at home.  Brother lives in house beside her and told her she needed to come to hospital for help.  Stated her family has been through a lot in the past few years.  Safety maintained with 1:1 per MD order.

## 2017-05-30 NOTE — Progress Notes (Addendum)
[facility] MD Progress Note  05/30/2017 10:16 AM Amanda Hall  MRN:  [id number] Subjective: Patient reports significant anxiety as major symptom. Tends to be somatically focused and describes concerns about bowel movements being irregular, sometimes loose, and having difficulties with independently maintaining personal hygiene . Also reports throat discomfort, which she attributes to dry mouth, denies dysphagia.  Continues to report some anorexia, and it appears poor PO intake is partially due to concerns about having bowel movements .  Denies medication side effects. Denies suicidal ideations. Objective: I have discussed case with treatment team and have met with patient. 67 year old single female, lives with sister, presented due to worsening anxiety, depression,significant anorexia, weight loss. Remains ruminative , anxious, somatically focused . Denies hallucinations, no delusions noted. Not internally preoccupied . Acknowledges depression, but states it is secondary to anxiety, which she describes as chronic but worsening,and denies SI. Remains tremulous but to lesser degree than on admission. Vitals stable. We have reviewed BZD management history- states she took medication as prescribed, denies abusing it or misusing it . PO intake limited, but has been drinking Ensure supplements. Limited group participation.   Principal Problem: GAD, MDD Diagnosis:   Patient Active Problem List   Diagnosis Date Noted  . Generalized anxiety disorder [F41.1] 05/26/2017  . Major depressive disorder, recurrent severe without psychotic features ([place]) [F33.2] 05/26/2017  . Hypercholesterolemia [E78.00] 03/22/2017  . Nonspecific (abnormal) findings on radiological and other examination of body structure [793] 12/26/2006  . NONSPECIFIC ABNORM FIND RAD&OTH EXAM LUNG FIELD [R93.0] 12/26/2006  . ANEMIA-IRON DEFICIENCY [D50.9] 12/15/2006  . Depression [F32.9] 12/15/2006  . Allergic rhinitis [J30.9] 12/15/2006   Total  Time spent with patient: 20 minutes  Past Psychiatric History: See admission H&P  Past Medical History:  Past Medical History:  Diagnosis Date  . Allergy   . Anemia   . Anxiety   . Depression   . Hemorrhoids     Past Surgical History:  Procedure Laterality Date  . arm surgery     Left  . TUBAL LIGATION     Family History:  Family History  Problem Relation Age of Onset  . Stroke Mother   . Hypertension Sister   . Cancer Neg Hx        breat and colon hx  . Diabetes Neg Hx        family   Family Psychiatric  History: See admission H&P Social History:  Social History   Substance and Sexual Activity  Alcohol Use No     Social History   Substance and Sexual Activity  Drug Use No    Social History   Socioeconomic History  . Marital status: Divorced    Spouse name: Not on file  . Number of children: Not on file  . Years of education: Not on file  . Highest education level: Not on file  Occupational History  . Not on file  Social Needs  . Financial resource strain: Not on file  . Food insecurity:    Worry: Not on file    Inability: Not on file  . Transportation needs:    Medical: Not on file    Non-medical: Not on file  Tobacco Use  . Smoking status: Never Smoker  . Smokeless tobacco: Never Used  Substance and Sexual Activity  . Alcohol use: No  . Drug use: No  . Sexual activity: Not Currently  Lifestyle  . Physical activity:    Days per week: Not on file  Minutes per session: Not on file  . Stress: Not on file  Relationships  . Social connections:    Talks on phone: Not on file    Gets together: Not on file    Attends religious service: Not on file    Active member of club or organization: Not on file    Attends meetings of clubs or organizations: Not on file    Relationship status: Not on file  Other Topics Concern  . Not on file  Social History Narrative  . Not on file   Additional Social History:   Sleep: improving   Appetite:   Fair  Current Medications: Current Facility-Administered Medications  Medication Dose Route Frequency Provider Last Rate Last Dose  . acetaminophen (TYLENOL) tablet 650 mg  650 mg Oral Q6H PRN Patrecia Pour, NP   650 mg at 05/27/17 0954  . alum & mag hydroxide-simeth (MAALOX/MYLANTA) 200-200-20 MG/5ML suspension 30 mL  30 mL Oral Q4H PRN Patrecia Pour, NP      . feeding supplement (ENSURE ENLIVE) (ENSURE ENLIVE) liquid 237 mL  237 mL Oral BID BM Cobos, Myer Peer, MD   237 mL at 05/30/17 0802  . FLUoxetine (PROZAC) capsule 10 mg  10 mg Oral Daily Sharma Covert, MD   10 mg at 05/30/17 0801  . LORazepam (ATIVAN) tablet 1 mg  1 mg Oral TID Sharma Covert, MD   1 mg at 05/30/17 0801  . magnesium hydroxide (MILK OF MAGNESIA) suspension 30 mL  30 mL Oral Daily PRN Patrecia Pour, NP      . mirtazapine (REMERON) tablet 30 mg  30 mg Oral QHS Sharma Covert, MD   30 mg at 05/29/17 2134  . OXcarbazepine (TRILEPTAL) tablet 150 mg  150 mg Oral QHS Sharma Covert, MD   150 mg at 05/29/17 2134  . OXcarbazepine (TRILEPTAL) tablet 75 mg  75 mg Oral BID Cobos, Myer Peer, MD   75 mg at 05/30/17 0801  . potassium chloride (KLOR-CON) packet 40 mEq  40 mEq Oral Once Patrecia Pour, NP        Lab Results:  No results found for this or any previous visit (from the past 52 hour(s)).  Blood Alcohol level:  Lab Results  Component Value Date   ETH <10 05/25/2017   ETH <10 02/77/4128    Metabolic Disorder Labs: No results found for: HGBA1C, MPG No results found for: PROLACTIN Lab Results  Component Value Date   CHOL 189 03/22/2017   TRIG 218.0 (H) 03/22/2017   HDL 50.60 03/22/2017   CHOLHDL 4 03/22/2017   VLDL 43.6 (H) 03/22/2017   LDLCALC 226 (H) 07/13/2016    Physical Findings: AIMS: Facial and Oral Movements Muscles of Facial Expression: Minimal Lips and Perioral Area: Minimal Jaw: None, normal Tongue: None, normal,Extremity Movements Upper (arms, wrists, hands, fingers):  Moderate Lower (legs, knees, ankles, toes): Mild, Trunk Movements Neck, shoulders, hips: Mild, Overall Severity Severity of abnormal movements (highest score from questions above): Moderate Incapacitation due to abnormal movements: Moderate Patient's awareness of abnormal movements (rate only patient's report): Aware, mild distress, Dental Status Current problems with teeth and/or dentures?: Yes Does patient usually wear dentures?: Yes  CIWA:  CIWA-Ar Total: 9 COWS:     Musculoskeletal: Strength & Muscle Tone: decreased Gait & Station: unable to stand Patient leans: N/A  Psychiatric Specialty Exam: Physical Exam  ROS no chest pain, no shortness of breath, no vomiting, reports irregular bowel movements ,intermittent diarrhea,  no fever, no chills  Denies dysphagia.   Blood pressure 139/81, pulse 81, temperature 97.7 F (36.5 C), temperature source Oral, resp. rate 16, height _0  (1.702 m), weight 54.9 kg (121 lb).Body mass index is 18.95 kg/m.  General Appearance: Fairly Groomed  Eye Contact:  improving   Speech:  Slow  Volume:  Decreased  Mood:  remains anxious, depressed   Affect:  anxious and at times tearful  Thought Process:  Linear and Descriptions of Associations: Intact- perseverative on somatic concerns   Orientation:  Other:  she is fully alert and attentive  Thought Content:  ruminative, somatic, denies hallucinations   Suicidal Thoughts:  No currently denies suicidal plans or intentions   Homicidal Thoughts:  No  Memory:  improved with respect to admission- 3/3 immediate and 2/3 at 5 minutes without cues , also able to spell 5 letter word backwards, follow three stage instruction, and identify common objects witthout difficulty   Judgement:  Fair  Insight:  Fair  Psychomotor Activity:  vaguely restless, remains tremulous but to lesser degree   Concentration:  Concentration: Fair and Attention Span: Fair  Recall:  [employer] of Knowledge:  Fair  Language:  Fair   Akathisia:  Negative  Handed:  Right  AIMS (if indicated):     Assets:  Desire for Improvement  ADL's:  Fair   Cognition:  WNL  Sleep:  Number of Hours: 6.25   Assessment - patient remains anxious, ruminative, somatically preoccupied. Denies SI today. Thus far tolerating medications well- currently on Prozac , Remeron, Trileptal .   Treatment Plan Summary: Encourage group and milieu participation to work on coping skills and symptom reduction Continue one to one observation for safety, fall precaution Continue Trileptal 75 mgrs BID and 150 mgrs QHS for mood disorder Continue Remeron 30 mgrs QHS for depression and insomnia Continue Prozac 10 mgrs QDAY for mood disorder  Continue Ativan 1 mgr TID for anxiety Treatment team working on disposition planning options  Check BMP in AM to monitor electrolytes and BUN, Creatinine Encourage PO fluids    Jenne Campus, MD 05/30/2017, 10:16 AM   Patient ID: Amanda Hall, female   DOB: 1950-09-08, 67 y.o.   MRN: [id number]

## 2017-05-30 NOTE — Progress Notes (Signed)
1:1 Note 0800  Patient sitting in wheelchair in her room with 1:1 present for safety.  Patient denied SI and HI, contracts for safety.  Denied A/V hallucinations.  Patient irritated when nurse asked about SI/HI/A/V, shook head no and stated she did not want to talk about it.  Patient stated something is bothering her throat, denied sore throat.  Patient tremulous.  Stated she did not eat very much of her breakfast tray, ensure given with morning medications per patient's request.  Respirations even and unlabored.  Patient denied pain.  Safety maintained with 1:1 per MD order.

## 2017-05-30 NOTE — Progress Notes (Signed)
1:1 Note 1730  Patient walked in hallway with 1:1 assistance this afternoon.  Patient's tremors have decreased.  Patient smiling and talking to staff.  Respirations even and unlabored.  No signs/symptoms of pain/distress noted on patient's face/body movements.  Safety maintained with 1:1 per MD order.

## 2017-05-30 NOTE — Progress Notes (Signed)
1:1 Note 1350  Patient continues to bathe in the large tub, washing hair.  MHT stated patient said she had not washed her hair in 3 months.  Patient relaxed.  Respirations even and unlabored.  Safety maintained with 1:1 per MD order.

## 2017-05-30 NOTE — Progress Notes (Signed)
Pt did not attend group this evening, but I did a 1:1 group with her in her room.

## 2017-05-30 NOTE — [facility] Group Notes (Signed)
[facility] LCSW Group Therapy Note  Date/Time: 05/30/17, 1315  Type of Therapy and Topic:  Group Therapy:  Overcoming Obstacles  Participation Level:  Did not attend  Description of Group:    In this group patients will be encouraged to explore what they see as obstacles to their own wellness and recovery. They will be guided to discuss their thoughts, feelings, and behaviors related to these obstacles. The group will process together ways to cope with barriers, with attention given to specific choices patients can make. Each patient will be challenged to identify changes they are motivated to make in order to overcome their obstacles. This group will be process-oriented, with patients participating in exploration of their own experiences as well as giving and receiving support and challenge from other group members.  Therapeutic Goals: 1. Patient will identify personal and current obstacles as they relate to admission. 2. Patient will identify barriers that currently interfere with their wellness or overcoming obstacles.  3. Patient will identify feelings, thought process and behaviors related to these barriers. 4. Patient will identify two changes they are willing to make to overcome these obstacles:    Summary of Patient Progress      Therapeutic Modalities:   Cognitive Behavioral Therapy Solution Focused Therapy Motivational Interviewing Relapse Prevention Therapy  Greg Adalberto Metzgar, LCSW 

## 2017-05-30 NOTE — Progress Notes (Signed)
Patient ID: Amanda Hall, female   DOB: November 29, 1950, 67 y.o.   MRN: [id number]  Pt currently presents with a worried affect and anxious behavior. Pt reports ongoing anxiety and helplessness about hemorrhoids and bathroom hygiene. Pt remains tremulous. Reports other somatic complaints. Pt remains hesitant in taking medications. Pt reports good sleep with current medication regimen.   Pt provided with medications per providers orders. Pt's labs and vitals were monitored throughout the night. Pt given a 1:1 about emotional and mental status. Pt supported and encouraged to express concerns and questions. Pt educated on medications, side effects, hemmorhoid care and anxiety. All need reinforcement.   Pt's safety ensured with 15 minute and environmental checks. Pt currently denies SI/HI and A/V hallucinations. Pt verbally agrees to seek staff if SI/HI or A/VH occurs and to consult with staff before acting on any harmful thoughts. Pt remains on a 1:1 for safety. Will continue POC.

## 2017-05-30 NOTE — Progress Notes (Signed)
Patient ID: Amanda Hall, female   DOB: Feb 06, 1950, 68 y.o.   MRN: [id number] Pt at this time is in bed resting with eyes closed. Pt does not look to be in any distress at this time. 1:1 staff is present in room with Pt at this time. 1:1 monitoring continues for Pt's safety. 15-minute safety checks also continues at this time.

## 2017-05-30 NOTE — Progress Notes (Signed)
Patient ID: Amanda Hall, female   DOB: 09/13/1950, 66 y.o.   MRN: [id number] Pt at this time is in bed resting with eyes closed. Pt does not look to be in any distress at this time. 1:1 staff is present in room with Pt at this time. 1:1 monitoring continues for Pt's safety. 15-minute safety checks also continues at this time.  

## 2017-05-30 NOTE — Plan of Care (Signed)
Nurse discussed depression, anxiety, coping skills with patient.  

## 2017-05-30 NOTE — Progress Notes (Addendum)
1:1 Note 1835  Patient was given hydrocortisone cream 1% for hemorrhoids.  Patient stated she uses this cream at home and needed to use at [facility].  Patient was given this cream while she was sitting in wheelchair in her room talking to MHT.  Patient was crying and then asked about this medication, unsure if she should use it.  Nurse explained to patient that she had requested this medication this afternoon, that this medication was listed on her home medications.  Patient was confused and tearful.   Patient has continually been offered food/drink throughout the day.  Patient went to dining room for lunch and dinner with 1:1 present for safety.  1:1 continues for safety per MD order.

## 2017-05-30 NOTE — Progress Notes (Signed)
Patient ID: Amanda Hall, female   DOB: 02-Mar-1950, 67 y.o.   MRN: [id number] PER STATE REGULATIONS 482.30  THIS CHART WAS REVIEWED FOR MEDICAL NECESSITY WITH RESPECT TO THE PATIENT'S ADMISSION/ DURATION OF STAY.  NEXT REVIEW DATE: 06/03/2017  Willa Rough, RN, BSN CASE MANAGER

## 2017-05-30 NOTE — Progress Notes (Signed)
Patient ID: Amanda Hall, female   DOB: [birth year], 67 y.o.   MRN: [id number]  1:1-   D: Pt currently sitting on the commode. Sitter is currently at patients side. Pt reports she was able to have a small BM. Reports ongoing anxiety that she is "stil impacted." Pt passing flatulence.  A: Emotional support given, pt given opportunity to express concerns. Sitter encouraged to share any pertinent observations. Pt educated on proper body mechanics of having a BM.  R: Pt remains safe on a 1:1 per MD orders. Pt responds well to teaching, teach back effective. Will continue to monitor.

## 2017-05-30 NOTE — [facility] Group Notes (Signed)
[facility] Group Notes:  (Nursing/MHT/Case Management/Adjunct)  Date:  05/30/2017  Time:  4:15 pm  Type of Therapy:  Psychoeducational Skills  Participation Level:  Did Not Attend  Participation Quality:    Affect:    Cognitive:    Insight:    Engagement in Group:    Modes of Intervention:    Summary of Progress/Problems:  Amanda Hall 05/30/2017, 6:24 PM

## 2017-05-30 NOTE — Progress Notes (Addendum)
1:1 Note 1630  Patient relaxed after her bath, tremors have decreased.  Patient talking to MHTs, nurse about her depression.  Patient admitted she is feeling better.  Respirations even and unlabored.  No signs/symptoms of pain/distress noted on patient's face/body movements. Staff has encouraged patient throughout the day to eat/drink fluids.  Patient did eat two containers of ice cream this afternoon.  Has been drinking gatorade and ginger ale throughout the day. Patient continues to ambulate in hallway using wheelchair. 1:1 continues for safety per MD orders.

## 2017-05-30 NOTE — Progress Notes (Signed)
1:1 Note 1300  Patient went to dining room for lunch in wheelchair with 1:1 present.  Patient stated she is concerned about hemorrhoids, that her MD had recommended that she use hydrocortison cream bid.  Patient taking bath in large tub with 1:1 present.  Respirations even and unlabored.  Tremors continue.  Safety maintained with 1:1 present for safety.

## 2017-05-31 LAB — BASIC METABOLIC PANEL
ANION GAP: 11 (ref 5–15)
BUN: 29 mg/dL — ABNORMAL HIGH (ref 6–20)
CHLORIDE: 99 mmol/L — AB (ref 101–111)
CO2: 30 mmol/L (ref 22–32)
Calcium: 9.4 mg/dL (ref 8.9–10.3)
Creatinine, Ser: 1 mg/dL (ref 0.44–1.00)
GFR calc Af Amer: >60 mL/min (ref >60)
GFR, EST NON AFRICAN AMERICAN: 58 mL/min — AB (ref >60)
GLUCOSE: 107 mg/dL — AB (ref 65–99)
Potassium: 3.6 mmol/L (ref 3.5–5.1)
Sodium: 140 mmol/L (ref 135–145)

## 2017-05-31 MED ORDER — OXCARBAZEPINE 150 MG PO TABS
225.0000 mg | ORAL_TABLET | Freq: Every day | ORAL | Status: DC
  Administered 2017-05-31 – 2017-06-05 (×6): 225 mg via ORAL
  Filled 2017-05-31 (×4): qty 1.5
  Filled 2017-05-31: qty 2
  Filled 2017-05-31 (×3): qty 1.5
  Filled 2017-05-31: qty 2
  Filled 2017-05-31: qty 1.5

## 2017-05-31 MED ORDER — OXCARBAZEPINE 300 MG PO TABS: 300.0000 mg | ORAL_TABLET | Freq: Every day | ORAL | Status: DC

## 2017-05-31 MED ORDER — FLUOXETINE HCL 20 MG PO CAPS
20.0000 mg | ORAL_CAPSULE | Freq: Every day | ORAL | Status: DC
  Administered 2017-06-01 – 2017-06-02 (×2): 20 mg via ORAL
  Filled 2017-05-31 (×4): qty 1

## 2017-05-31 MED ORDER — HYDROCORTISONE 2.5 % RE CREA
TOPICAL_CREAM | Freq: Three times a day (TID) | RECTAL | Status: DC
  Administered 2017-05-31 – 2017-06-09 (×5): via RECTAL
  Filled 2017-05-31: qty 28.35

## 2017-05-31 MED ORDER — MEGESTROL ACETATE 40 MG PO TABS
40.0000 mg | ORAL_TABLET | Freq: Every day | ORAL | Status: DC
  Administered 2017-05-31 – 2017-06-09 (×9): 40 mg via ORAL
  Filled 2017-05-31 (×12): qty 1

## 2017-05-31 MED ORDER — MIRTAZAPINE 15 MG PO TABS
45.0000 mg | ORAL_TABLET | Freq: Every day | ORAL | Status: DC
  Administered 2017-05-31 – 2017-06-09 (×10): 45 mg via ORAL
  Filled 2017-05-31 (×11): qty 3

## 2017-05-31 NOTE — Progress Notes (Signed)
1:1 Note 0835  Patient continues to sit in bathroom, tearful, hands tremulous.  Fluids and breakfast given patient this morning.  Labs were drawn this morning in patient's room.  1:1 continues for safety per MD order.

## 2017-05-31 NOTE — Progress Notes (Addendum)
Patient ID: Amanda Hall, female   DOB: 03-12-1950, 67 y.o.   MRN: [id number]  1:1-   D: Pt currently sitting in bed. Sitter is currently at patients side. Pt has labs drawn, tolerated well. States "I'm just tired" while rubbing her eyes.  A: Emotional support given, pt given opportunity to express concerns. Sitter encouraged to share any pertinent observations.  R: Pt remains safe on a 1:1 per MD orders. Pt in no current distress. Will continue to monitor.

## 2017-05-31 NOTE — Progress Notes (Signed)
Patient ID: Amanda Hall, female   DOB: 10/04/1950, 66 y.o.   MRN: [id number]  1:1-   D: Pt currently lying in bed with eyes closed. Respirations even and unlabored. Sitter is currently within arms reach of patient.  A: Visual assessment of patient and environmental safety completed. Sitter encouraged to share any pertinent observations. None to note. R: Pt remains safe on a 1:1 per MD orders. Pt in no current distress. Will continue to monitor.     

## 2017-05-31 NOTE — Progress Notes (Addendum)
1:1 Note 1845  Patient went to dining room for dinner this afternoon  Patient ate 100% of her dinner.  Patient continues to be in her wheelchair with 1:1 present for safety.  Patient is presently talking on hall phone.  Tremors have decreased in patient's hands, arms.  Respirations even and unlabored.  No signs/symptoms of pain/distress noted on patient's face/body movements. 1:1 continues for safety per MD order.

## 2017-05-31 NOTE — [facility] Group Notes (Signed)
LCSW Group Therapy Note 05/31/2017 2:30 PM  Type of Therapy/Topic: Group Therapy: Feelings about Diagnosis  Participation Level: Did Not Attend   Description of Group:  This group will allow patients to explore their thoughts and feelings about diagnoses they have received. Patients will be guided to explore their level of understanding and acceptance of these diagnoses. Facilitator will encourage patients to process their thoughts and feelings about the reactions of others to their diagnosis and will guide patients in identifying ways to discuss their diagnosis with significant others in their lives. This group will be process-oriented, with patients participating in exploration of their own experiences, giving and receiving support, and processing challenge from other group members.  Therapeutic Goals: 1. Patient will demonstrate understanding of diagnosis as evidenced by identifying two or more symptoms of the disorder 2. Patient will be able to express two feelings regarding the diagnosis 3. Patient will demonstrate their ability to communicate their needs through discussion and/or role play  Summary of Patient Progress:  Invited, chose not to attend.     Therapeutic Modalities:  Cognitive Behavioral Therapy Brief Therapy Feelings Identification    Graeme Menees Catalina [place] Clinical Social Worker

## 2017-05-31 NOTE — Progress Notes (Signed)
1:1 Note 1245  Patient went to dining room in wheelchair with 1:1 present for safety.  Sitz bath ordered from [employer] for patient's use.  Safety maintained with 1:1 for safety per MD order.

## 2017-05-31 NOTE — Progress Notes (Signed)
1:1 Note 1600  Patient sleeping in bed this afternoon.  Respirations even and unlabored.  No signs/symptoms of pain/distress noted on patient's face/body movements.  Patient has continued to refuse sitz bath that was ordered for her comfort by MD today. 1:1 continues for safety per MD order.

## 2017-05-31 NOTE — Progress Notes (Addendum)
1:1 Note 1400  Patient laying on her bed with 1:1 present for safety.  Patient's tremors continue, hands, arms.  Patient worried about hemorrhoids, denied pain.  Has refused anusol cream medication and sitz path that MD ordered for her comfort.  Patient stated she needs to call her sister and ask her opinion.  Patient refused afternoon ensure at this time.  Patient ate 100% lunch today in dining room.  Safety maintained with 1:1 per MD order.

## 2017-05-31 NOTE — Progress Notes (Signed)
1:1 Note 1100  Patient has been in her room sitting in wheelchair with 1:1 present.  Patient's tremors continue.  Patient's hair has been braided.  Patient continues to look sad.  Respirations even and unlabored.  1:1 continues per MD order for safety.

## 2017-05-31 NOTE — Plan of Care (Signed)
Nurse discussed anxiety, depression, coping skills with patient.  MHT continues to reinforce coping skills with patient.

## 2017-05-31 NOTE — Progress Notes (Signed)
1:1 Note 0805  Patient came to medication window in wheelchair with 1:1 present for safety.  Patient confused about medications, does not remember taking medications yesterday morning.  Patient's hands are tremulous at this time.  Patient did have BM this morning.  Again patient informed that hydrocortisone cream available for hemorrhoids if needed.

## 2017-05-31 NOTE — Progress Notes (Signed)
[facility] MD Progress Note  05/31/2017 12:02 PM Kamarie Veno  MRN:  [id number] Subjective: Patient is seen and examined.  Patient is a 67 year old female with a past psychiatric history significant for generalized anxiety disorder as well as major depression who is seen in follow-up.  She is essentially the same as she was when I last saw her on 05/29/2017.  She continues on Remeron, fluoxetine and Trileptal.  Her medications were not adjusted yesterday.  She stated that she is having trouble swallowing, and denied ever having had an EGD in the past.  She was unable to quantify whether it was because of dry mouth or swallowing problems.  I saw on the notes that they discussed the possibility of electroconvulsive therapy.  I discussed that with the patient today, and she has declined this.  She remains tremulous, and anxious. Principal Problem: <principal problem not specified> Diagnosis:   Patient Active Problem List   Diagnosis Date Noted  . Generalized anxiety disorder [F41.1] 05/26/2017  . Major depressive disorder, recurrent severe without psychotic features ([place]) [F33.2] 05/26/2017  . Hypercholesterolemia [E78.00] 03/22/2017  . Nonspecific (abnormal) findings on radiological and other examination of body structure [793] 12/26/2006  . NONSPECIFIC ABNORM FIND RAD&OTH EXAM LUNG FIELD [R93.0] 12/26/2006  . ANEMIA-IRON DEFICIENCY [D50.9] 12/15/2006  . Depression [F32.9] 12/15/2006  . Allergic rhinitis [J30.9] 12/15/2006   Total Time spent with patient: 30 minutes  Past Psychiatric History: See admission H&P  Past Medical History:  Past Medical History:  Diagnosis Date  . Allergy   . Anemia   . Anxiety   . Depression   . Hemorrhoids     Past Surgical History:  Procedure Laterality Date  . arm surgery     Left  . TUBAL LIGATION     Family History:  Family History  Problem Relation Age of Onset  . Stroke Mother   . Hypertension Sister   . Cancer Neg Hx        breat and colon hx  .  Diabetes Neg Hx        family   Family Psychiatric  History: See admission H&P Social History:  Social History   Substance and Sexual Activity  Alcohol Use No     Social History   Substance and Sexual Activity  Drug Use No    Social History   Socioeconomic History  . Marital status: Divorced    Spouse name: Not on file  . Number of children: Not on file  . Years of education: Not on file  . Highest education level: Not on file  Occupational History  . Not on file  Social Needs  . Financial resource strain: Not on file  . Food insecurity:    Worry: Not on file    Inability: Not on file  . Transportation needs:    Medical: Not on file    Non-medical: Not on file  Tobacco Use  . Smoking status: Never Smoker  . Smokeless tobacco: Never Used  Substance and Sexual Activity  . Alcohol use: No  . Drug use: No  . Sexual activity: Not Currently  Lifestyle  . Physical activity:    Days per week: Not on file    Minutes per session: Not on file  . Stress: Not on file  Relationships  . Social connections:    Talks on phone: Not on file    Gets together: Not on file    Attends religious service: Not on file    Active member  of club or organization: Not on file    Attends meetings of clubs or organizations: Not on file    Relationship status: Not on file  Other Topics Concern  . Not on file  Social History Narrative  . Not on file   Additional Social History:                         Sleep: Fair  Appetite:  Poor  Current Medications: Current Facility-Administered Medications  Medication Dose Route Frequency Provider Last Rate Last Dose  . acetaminophen (TYLENOL) tablet 650 mg  650 mg Oral Q6H PRN Patrecia Pour, NP   650 mg at 05/27/17 0954  . alum & mag hydroxide-simeth (MAALOX/MYLANTA) 200-200-20 MG/5ML suspension 30 mL  30 mL Oral Q4H PRN Patrecia Pour, NP      . feeding supplement (ENSURE ENLIVE) (ENSURE ENLIVE) liquid 237 mL  237 mL Oral BID BM  Cobos, Myer Peer, MD   237 mL at 05/31/17 0804  . [START ON 06/01/2017] FLUoxetine (PROZAC) capsule 20 mg  20 mg Oral Daily Sharma Covert, MD      . hydrocortisone (ANUSOL-HC) 2.5 % rectal cream   Rectal TID Sharma Covert, MD      . hydrocortisone cream 1 %   Topical BID PRN Cobos, Myer Peer, MD      . LORazepam (ATIVAN) tablet 1 mg  1 mg Oral TID Sharma Covert, MD   1 mg at 05/31/17 0802  . magnesium hydroxide (MILK OF MAGNESIA) suspension 30 mL  30 mL Oral Daily PRN Patrecia Pour, NP      . mirtazapine (REMERON) tablet 45 mg  45 mg Oral QHS Sharma Covert, MD      . Oxcarbazepine (TRILEPTAL) tablet 300 mg  300 mg Oral QHS Sharma Covert, MD      . OXcarbazepine (TRILEPTAL) tablet 75 mg  75 mg Oral BID Cobos, Myer Peer, MD   75 mg at 05/31/17 0802  . potassium chloride (KLOR-CON) packet 40 mEq  40 mEq Oral Once Patrecia Pour, NP        Lab Results:  Results for orders placed or performed during the hospital encounter of 05/26/17 (from the past 48 hour(s))  Basic metabolic panel     Status: Abnormal   Collection Time: 05/31/17  6:37 AM  Result Value Ref Range   Sodium 140 135 - 145 mmol/L   Potassium 3.6 3.5 - 5.1 mmol/L   Chloride 99 (L) 101 - 111 mmol/L   CO2 30 22 - 32 mmol/L   Glucose, Bld 107 (H) 65 - 99 mg/dL   BUN 29 (H) 6 - 20 mg/dL   Creatinine, Ser 1.00 0.44 - 1.00 mg/dL   Calcium 9.4 8.9 - 10.3 mg/dL   GFR calc non Af Amer 58 (L) >60 mL/min   GFR calc Af Amer >60 >60 mL/min    Comment: (NOTE) The eGFR has been calculated using the CKD EPI equation. This calculation has not been validated in all clinical situations. eGFR's persistently <60 mL/min signify possible Chronic Kidney Disease.    Anion gap 11 5 - 15    Comment: Performed at [facility], [place] [address] [place], [place] [address]    Blood Alcohol level:  Lab Results  Component Value Date   ETH <10 05/25/2017   ETH <10 92/42/6834    Metabolic Disorder  Labs: No results found for: HGBA1C, MPG No  results found for: PROLACTIN Lab Results  Component Value Date   CHOL 189 03/22/2017   TRIG 218.0 (H) 03/22/2017   HDL 50.60 03/22/2017   CHOLHDL 4 03/22/2017   VLDL 43.6 (H) 03/22/2017   LDLCALC 226 (H) 07/13/2016    Physical Findings: AIMS: Facial and Oral Movements Muscles of Facial Expression: None, normal Lips and Perioral Area: None, normal Jaw: None, normal Tongue: None, normal,Extremity Movements Upper (arms, wrists, hands, fingers): None, normal Lower (legs, knees, ankles, toes): None, normal, Trunk Movements Neck, shoulders, hips: None, normal, Overall Severity Severity of abnormal movements (highest score from questions above): None, normal Incapacitation due to abnormal movements: None, normal Patient's awareness of abnormal movements (rate only patient's report): No Awareness, Dental Status Current problems with teeth and/or dentures?: Yes Does patient usually wear dentures?: Yes  CIWA:  CIWA-Ar Total: 10 COWS:  COWS Total Score: 7  Musculoskeletal: Strength & Muscle Tone: decreased Gait & Station: unsteady Patient leans: N/A  Psychiatric Specialty Exam: Physical Exam  Constitutional: She appears well-developed and well-nourished.  HENT:  Head: Normocephalic and atraumatic.  Respiratory: Effort normal.  Neurological: She is alert.    ROS  Blood pressure (!) 119/52, pulse 88, temperature 97.7 F (36.5 C), temperature source Oral, resp. rate 16, height 5' 7"  (1.702 m), weight 54.9 kg (121 lb).Body mass index is 18.95 kg/m.  General Appearance: Disheveled  Eye Contact:  Minimal  Speech:  Normal Rate  Volume:  Decreased  Mood:  Anxious and Depressed  Affect:  Congruent  Thought Process:  Coherent  Orientation:  Full (Time, Place, and Person)  Thought Content:  Obsessions  Suicidal Thoughts:  Yes.  without intent/plan  Homicidal Thoughts:  No  Memory:  Immediate;   Fair  Judgement:  Impaired  Insight:   Lacking  Psychomotor Activity:  Increased  Concentration:  Concentration: Poor  Recall:  [employer] of Knowledge:  Fair  Language:  Fair  Akathisia:  Negative  Handed:  Right  AIMS (if indicated):     Assets:  Desire for Improvement  ADL's:  Impaired  Cognition:  WNL  Sleep:  Number of Hours: 6.75     Treatment Plan Summary: Daily contact with patient to assess and evaluate symptoms and progress in treatment, Medication management and Plan Patient is seen and examined.  Patient is a 67 year old female with the above-stated past psychiatric history is seen in follow-up.  I am going to increase her Remeron to 45 mg p.o. nightly.  I am also going to increase her fluoxetine to 20 mg p.o. daily.  I am also going to increase her Trileptal at bedtime back to 225 mg.  We will continue the 75 mg p.o. daily.  I am also going to add Megace to try and stimulate her appetite.  I know that ECT was under consideration, but the patient declines this at this time.  No change in her Ativan at 1 mg p.o. 3 times daily.  Sharma Covert, MD 05/31/2017, 12:02 PM

## 2017-05-31 NOTE — Progress Notes (Signed)
1:1 Note 0735  Patient sitting in wheelchair in her room eating breakfast.  1:1 present for safety.  Patient denied SI and HI.  Denied A/V hallucinations.  Denied pain.  Respirations even and unlabored.  No signs/symptoms of pain/distress noted on patient's face/body movements.  Patient hands are not shaking this morning.  Nurse reminded  patient that hydrocortisone cream is available for hemorrhoids if needed and she shook her head that she understood.  1:1 continues per MD order for safety.

## 2017-05-31 NOTE — Progress Notes (Signed)
D: Pt was in the hallway upon initial approach.  She presents with depressed, anxious affect and mood.  Her speech is slow and soft.  Pt is tremulous.  She reports her day "could've been better" but did not elaborate.  Her goal is to "try not to worry so much."  Pt was asked how she plans to accomplish this and she states "I don't know."  Writer recommended positive coping skills of writing in journal and talking to peers and staff.  Pt provided with journal and pen.  She denies SI/HI and hallucinations.  Pt complains of back pain of 5/10.  She is using a wheelchair for mobility.  Pt did not attend evening group tonight.    A: Introduced self to pt.  Actively listened to pt and provided support and encouragement.  Medication administered per order.  Medication education provided.  PO fluids encouraged and provided.  Fall prevention techniques reviewed with pt and pt verbalized understanding.  PRN medication administered for pain.  Heat packs offered for pain, pt declined.  1:1 staff is with pt for safety.  R: Pt was hesitant to take HS medications, expressing feeling worried about being drowsy in the morning.  Pt was compliant with medications with much encouragement from staff.  She verbally contracts for safety and reports she will inform staff of needs and concerns.  1:1 continues for safety.

## 2017-06-01 MED ORDER — LORAZEPAM 1 MG PO TABS
1.0000 mg | ORAL_TABLET | Freq: Once | ORAL | Status: AC
  Administered 2017-06-01: 1 mg via ORAL
  Filled 2017-06-01: qty 1

## 2017-06-01 NOTE — Progress Notes (Signed)
PT Note  Patient Details Name: Amanda Hall MRN: [id number] DOB: 27-Feb-1950       Spoke with staff at [facility] to gather more information on skilled PT needs. We will be over tomorrow to trial a RW with patient to assist with mobility and ambulation.   Thank you    Marella Bile 06/01/2017, 3:44 PM  Marella Bile, PT Pager: [phone] 06/01/2017

## 2017-06-01 NOTE — Progress Notes (Signed)
CSW contacted the patient's sister, Fizza Scales ([phone]) to discuss collateral information.   Per [name], the patient has experienced severe anxiety for the past few years. She reports she noticed a decline in the patient's mental health once she learned she had hemorrhoids. Per [name], the patient requires a lot of assistance with ambulating, bathing and cleansing herself (especially hemorrhoids area). [name] reports that she "wants her sister to be in a better space mentally".    [name] states that she can continue to care for her sister once she return home from the hospital. She states that she has a great support system that includes her other siblings who all live very close to she and the patient. [name] was adamant that she does not want the patient to go to a nursing or assisted living facility at this time.    CSW will continue to follow.    Baldo Daub, MSW, LCSWA Clinical Social Worker [facility]  Phone: [phone]

## 2017-06-01 NOTE — Progress Notes (Signed)
Patient ID: Amanda Hall, female   DOB: May 22, 1950, 67 y.o.   MRN: [id number]   1:1 Nursing Progress Note  D: Patient has returned from dinner and is ambulating with the wheelchair. Patient attended group this evening. Patient remains tearful and anxious requiring frequent redirection by staff. Patient continuing to ruminate about hemorrhoids but is redirected by staff to focus on other activities. Environment is secured. Sitter observed with patient per [employer].  A: Patient remains on 1:1 observation per provider orders. High fall risk precautions in place. Patient safety monitored with q15 minute safety checks. Vitals and labs monitored.  R: Patient remains safe on the unit at this time. Will continue to monitor with 1:1 sitter, q15 min safety checks & q4 hour nursing assessments.

## 2017-06-01 NOTE — Plan of Care (Signed)
Problem: Safety: Goal: Periods of time without injury will increase Intervention: High fall risk precautions in place. Safety monitored with q15 minute checks and 1:1 observation. Outcome: Patient remains safe on the unit at this time. 06/01/2017 1:57 PM - Progressing by Ferrel Logan, RN

## 2017-06-01 NOTE — Progress Notes (Signed)
Pt is resting in bed with eyes closed.  Respirations are even and unlabored.  No distress noted.  1:1 staff remains with pt for safety.  Pt is safe on the unit. 

## 2017-06-01 NOTE — Progress Notes (Signed)
[facility] MD Progress Note  06/01/2017 11:12 AM Amanda Hall  MRN:  [id number] Subjective: Patient is seen and examined.  Patient is a 67 year old female with a past psychiatric history significant for generalized anxiety disorder as well as major depression who was seen in follow-up.  She is essentially the same from yesterday.  She remains anxious and tremulous.  Yesterday her Remeron, fluoxetine, and Trileptal were all increased.  She still is focused on her hemorrhoid issue.  We have followed what she has requested, but then she declines it.  She is unable to verbalize exactly what she wants done about this.  She was also started on Megace yesterday, and we discussed restarting BuSpar and titrating that up for her anxiety.  I did check for cogwheeling today, and that was negative. Principal Problem: <principal problem not specified> Diagnosis:   Patient Active Problem List   Diagnosis Date Noted  . Generalized anxiety disorder [F41.1] 05/26/2017  . Major depressive disorder, recurrent severe without psychotic features ([place]) [F33.2] 05/26/2017  . Hypercholesterolemia [E78.00] 03/22/2017  . Nonspecific (abnormal) findings on radiological and other examination of body structure [793] 12/26/2006  . NONSPECIFIC ABNORM FIND RAD&OTH EXAM LUNG FIELD [R93.0] 12/26/2006  . ANEMIA-IRON DEFICIENCY [D50.9] 12/15/2006  . Depression [F32.9] 12/15/2006  . Allergic rhinitis [J30.9] 12/15/2006   Total Time spent with patient: 30 minutes  Past Psychiatric History: See admission H&P  Past Medical History:  Past Medical History:  Diagnosis Date  . Allergy   . Anemia   . Anxiety   . Depression   . Hemorrhoids     Past Surgical History:  Procedure Laterality Date  . arm surgery     Left  . TUBAL LIGATION     Family History:  Family History  Problem Relation Age of Onset  . Stroke Mother   . Hypertension Sister   . Cancer Neg Hx        breat and colon hx  . Diabetes Neg Hx        family   Family  Psychiatric  History: See admission H&P Social History:  Social History   Substance and Sexual Activity  Alcohol Use No     Social History   Substance and Sexual Activity  Drug Use No    Social History   Socioeconomic History  . Marital status: Divorced    Spouse name: Not on file  . Number of children: Not on file  . Years of education: Not on file  . Highest education level: Not on file  Occupational History  . Not on file  Social Needs  . Financial resource strain: Not on file  . Food insecurity:    Worry: Not on file    Inability: Not on file  . Transportation needs:    Medical: Not on file    Non-medical: Not on file  Tobacco Use  . Smoking status: Never Smoker  . Smokeless tobacco: Never Used  Substance and Sexual Activity  . Alcohol use: No  . Drug use: No  . Sexual activity: Not Currently  Lifestyle  . Physical activity:    Days per week: Not on file    Minutes per session: Not on file  . Stress: Not on file  Relationships  . Social connections:    Talks on phone: Not on file    Gets together: Not on file    Attends religious service: Not on file    Active member of club or organization: Not on file  Attends meetings of clubs or organizations: Not on file    Relationship status: Not on file  Other Topics Concern  . Not on file  Social History Narrative  . Not on file   Additional Social History:                         Sleep: Fair  Appetite:  Fair  Current Medications: Current Facility-Administered Medications  Medication Dose Route Frequency Provider Last Rate Last Dose  . acetaminophen (TYLENOL) tablet 650 mg  650 mg Oral Q6H PRN Patrecia Pour, NP   650 mg at 05/31/17 2002  . alum & mag hydroxide-simeth (MAALOX/MYLANTA) 200-200-20 MG/5ML suspension 30 mL  30 mL Oral Q4H PRN Patrecia Pour, NP      . feeding supplement (ENSURE ENLIVE) (ENSURE ENLIVE) liquid 237 mL  237 mL Oral BID BM Cobos, Myer Peer, MD   237 mL at 05/31/17  0804  . FLUoxetine (PROZAC) capsule 20 mg  20 mg Oral Daily Sharma Covert, MD   20 mg at 06/01/17 2725  . hydrocortisone (ANUSOL-HC) 2.5 % rectal cream   Rectal TID Sharma Covert, MD      . hydrocortisone cream 1 %   Topical BID PRN Cobos, Myer Peer, MD      . LORazepam (ATIVAN) tablet 1 mg  1 mg Oral TID Sharma Covert, MD   1 mg at 06/01/17 0807  . magnesium hydroxide (MILK OF MAGNESIA) suspension 30 mL  30 mL Oral Daily PRN Patrecia Pour, NP      . megestrol (MEGACE) tablet 40 mg  40 mg Oral Daily Sharma Covert, MD   40 mg at 06/01/17 3664  . mirtazapine (REMERON) tablet 45 mg  45 mg Oral QHS Sharma Covert, MD   45 mg at 05/31/17 2106  . OXcarbazepine (TRILEPTAL) tablet 225 mg  225 mg Oral QHS Sharma Covert, MD   225 mg at 05/31/17 2106  . OXcarbazepine (TRILEPTAL) tablet 75 mg  75 mg Oral BID Cobos, Myer Peer, MD   75 mg at 06/01/17 0807  . potassium chloride (KLOR-CON) packet 40 mEq  40 mEq Oral Once Patrecia Pour, NP        Lab Results:  Results for orders placed or performed during the hospital encounter of 05/26/17 (from the past 48 hour(s))  Basic metabolic panel     Status: Abnormal   Collection Time: 05/31/17  6:37 AM  Result Value Ref Range   Sodium 140 135 - 145 mmol/L   Potassium 3.6 3.5 - 5.1 mmol/L   Chloride 99 (L) 101 - 111 mmol/L   CO2 30 22 - 32 mmol/L   Glucose, Bld 107 (H) 65 - 99 mg/dL   BUN 29 (H) 6 - 20 mg/dL   Creatinine, Ser 1.00 0.44 - 1.00 mg/dL   Calcium 9.4 8.9 - 10.3 mg/dL   GFR calc non Af Amer 58 (L) >60 mL/min   GFR calc Af Amer >60 >60 mL/min    Comment: (NOTE) The eGFR has been calculated using the CKD EPI equation. This calculation has not been validated in all clinical situations. eGFR's persistently <60 mL/min signify possible Chronic Kidney Disease.    Anion gap 11 5 - 15    Comment: Performed at [facility], [place] [address] [place], [place] [address]    Blood Alcohol level:  Lab  Results  Component Value Date   ETH <10  05/25/2017   ETH <10 57/26/2035    Metabolic Disorder Labs: No results found for: HGBA1C, MPG No results found for: PROLACTIN Lab Results  Component Value Date   CHOL 189 03/22/2017   TRIG 218.0 (H) 03/22/2017   HDL 50.60 03/22/2017   CHOLHDL 4 03/22/2017   VLDL 43.6 (H) 03/22/2017   LDLCALC 226 (H) 07/13/2016    Physical Findings: AIMS: Facial and Oral Movements Muscles of Facial Expression: None, normal Lips and Perioral Area: None, normal Jaw: None, normal Tongue: None, normal,Extremity Movements Upper (arms, wrists, hands, fingers): None, normal Lower (legs, knees, ankles, toes): None, normal, Trunk Movements Neck, shoulders, hips: None, normal, Overall Severity Severity of abnormal movements (highest score from questions above): None, normal Incapacitation due to abnormal movements: None, normal Patient's awareness of abnormal movements (rate only patient's report): No Awareness, Dental Status Current problems with teeth and/or dentures?: Yes Does patient usually wear dentures?: Yes  CIWA:  CIWA-Ar Total: 5 COWS:  COWS Total Score: 5  Musculoskeletal: Strength & Muscle Tone: decreased Gait & Station: unsteady Patient leans: N/A  Psychiatric Specialty Exam: Physical Exam  Nursing note and vitals reviewed. Constitutional: She is oriented to person, place, and time. She appears well-developed and well-nourished.  HENT:  Head: Normocephalic and atraumatic.  Respiratory: Effort normal.  Neurological: She is alert and oriented to person, place, and time.    ROS  Blood pressure (!) 141/70, pulse 89, temperature 97.8 F (36.6 C), temperature source Oral, resp. rate 16, height _0  (1.702 m), weight 56.7 kg (125 lb), SpO2 100 %.Body mass index is 19.58 kg/m.  General Appearance: Casual  Eye Contact:  Minimal  Speech:  Slow  Volume:  Decreased  Mood:  Anxious  Affect:  Congruent  Thought Process:  Goal Directed   Orientation:  Full (Time, Place, and Person)  Thought Content:  Negative  Suicidal Thoughts:  No  Homicidal Thoughts:  No  Memory:  Immediate;   Fair  Judgement:  Impaired  Insight:  Lacking  Psychomotor Activity:  Increased  Concentration:  Concentration: Fair  Recall:  [employer] of Knowledge:  Fair  Language:  Good  Akathisia:  Negative  Handed:  Right  AIMS (if indicated):     Assets:  Social Support  ADL's:  Impaired  Cognition:  WNL  Sleep:  Number of Hours: 6     Treatment Plan Summary: Daily contact with patient to assess and evaluate symptoms and progress in treatment, Medication management and Plan Patient is seen and examined.  Patient is a 67 year old female with the above-stated past psychiatric history who is seen in follow-up.  She is essentially unchanged.  We will allow the new medication increases to kick in.  I am also going to add BuSpar 7.5 mg p.o. twice daily and titrate that.  I have also discussed with social work today for the probable necessity of having her in a nursing home type situation.  She will need physical therapy and other skilled nursing grams to be able to help her for the long run.  Sharma Covert, MD 06/01/2017, 11:12 AM

## 2017-06-01 NOTE — Progress Notes (Signed)
Patient ID: Amanda Hall, female   DOB: 23-Oct-1950, 67 y.o.   MRN: [id number]  1:1 Nursing Progress Note  D: Patient presents with anxious mood but is cooperating with staff. Patient remains weak and is ambulating with a wheelchair and 1:1 sitter. Patient has received morning medications and has taken a bath. Patient ate most of her breakfast this morning. Patient remains fixated on her hemorrhoids and asked writer to check for bleeding. No signs of blood. Patient is encouraged to attend groups. Environment is secured. Sitter observed with patient per [employer].  A: Patient remains on 1:1 observation per provider orders. High fall risk precautions in place. Patient safety monitored with q15 minute safety checks. Vitals and labs monitored.  R: Patient remains safe on the unit at this time. Will continue to monitor with 1:1 sitter, q15 min safety checks & q4 hour nursing assessments.

## 2017-06-01 NOTE — Progress Notes (Signed)
1:1 progress notes:  Pt continues with high anxiety and ruminating about her surgery.  She is very cautious when using the bathroom, but so far this evening has not had any issues or difficulty using the bathroom.  The sitter brought her to the med window for her HS meds, and she seemed very concerned about the meds she was receiving.  Writer did med teaching with her and assured her that the meds were the same ones she had taken last night.  Pt denies SI/HI/AVH.  Continue 1:1 for safety d/t pt's weakness and high fall risk.  Sitter with pt at all times.  Support and encouragement offered.  Discharge plans are in process.  Pt is safe at this time.

## 2017-06-01 NOTE — Progress Notes (Signed)
Adult Psychoeducational Group Note  Date:  06/01/2017 Time:  9:11 PM  Group Topic/Focus:  Wrap-Up Group:   The focus of this group is to help patients review their daily goal of treatment and discuss progress on daily workbooks.  Participation Level:  Did Not Attend    Gerrit Heck 06/01/2017, 9:11 PM

## 2017-06-01 NOTE — Progress Notes (Signed)
Pt is resting in bed with eyes closed.  Respirations are even and unlabored.  No distress noted.  1:1 staff remains with pt for safety.  Pt is safe on the unit.

## 2017-06-01 NOTE — [facility] Group Notes (Signed)
[facility] Mental Health Association Group Therapy 06/01/2017 1:15pm  Type of Therapy: Mental Health Association Presentation  Participation Level: Active  Participation Quality: Attentive  Affect: Appropriate  Cognitive: Oriented  Insight: Developing/Improving  Engagement in Therapy: Engaged  Modes of Intervention: Discussion, Education and Socialization  Summary of Progress/Problems: Mental Health Association (MHA) Speaker came to talk about his personal journey with mental health. The pt processed ways by which to relate to the speaker. MHA speaker provided handouts and educational information pertaining to groups and services offered by the [facility]. Pt was engaged in speaker's presentation and was receptive to resources provided.    [employer], LCSW 06/01/2017 10:42 AM

## 2017-06-01 NOTE — Plan of Care (Signed)
  Problem: Safety: Goal: Periods of time without injury will increase Outcome: Progressing Note:  Pt has not harmed self or others tonight.  She denies SI/HI and verbally contracts for safety.    

## 2017-06-01 NOTE — [facility] Group Notes (Signed)
  Date:  06/01/2017 Time:  4:00 PM Group Topic/Focus: Personal Development Self Care:   The focus of this group is to help patients understand the importance of self-care in order to improve or restore emotional, physical, spiritual, interpersonal, and financial health.  Participation Level:  Active  Participation Quality:  Appropriate and Sharing  Affect:  Appropriate  Cognitive:  Alert and Appropriate  Insight: Appropriate and Improving  Engagement in Group:  Developing/Improving, Engaged  Modes of Intervention:  Clarification, Problem-solving and Support  Additional Comments: Patient was reluctant to share stories and insight at first, but opened up to the group and was able to share some stressors and ways of coping.   Dewayne Shorter 06/01/2017, 5:44 PM

## 2017-06-01 NOTE — Progress Notes (Signed)
Patient ID: Amanda Hall, female   DOB: 29-Mar-1950, 66 y.o.   MRN: [id number]  1:1 Nursing Progress Note  D: Patient has been to groups and the cafeteria for meals today. Patient is currently tearful and anxious needing redirection to transfer from the toilet back to her wheelchair. Patient remains fixated on her hemorrhoids. No signs of bleeding at this time. Patient had a formed bowel movement. Patient medicated with new orders. Emotional support provided. Environment is secured. Sitter observed with patient per [employer].  A: Patient remains on 1:1 observation per provider orders. High fall risk precautions in place. Patient safety monitored with q15 minute safety checks. Vitals and labs monitored.  R: Patient remains safe on the unit at this time. Will continue to monitor with 1:1 sitter, q15 min safety checks & q4 hour nursing assessments.

## 2017-06-01 NOTE — [facility] Suicide Risk Assessment (Signed)
[facility] INPATIENT:  Family/Significant Other Suicide Prevention Education  Suicide Prevention Education:  Education Completed; Tillie Viverette, sister [phone]) has been identified by the patient as the family member/significant other with whom the patient will be residing, and identified as the person(s) who will aid the patient in the event of a mental health crisis (suicidal ideations/suicide attempt).  With written consent from the patient, the family member/significant other has been provided the following suicide prevention education, prior to the and/or following the discharge of the patient.  The suicide prevention education provided includes the following:  Suicide risk factors  Suicide prevention and interventions  National Suicide Hotline telephone number  [facility] assessment telephone number  [facility] Emergency Assistance 911  [facility] and/or Residential Mobile Crisis Unit telephone number  Request made of family/significant other to:  Remove weapons (e.g., guns, rifles, knives), all items previously/currently identified as safety concern.    Remove drugs/medications (over-the-counter, prescriptions, illicit drugs), all items previously/currently identified as a safety concern.  The family member/significant other verbalizes understanding of the suicide prevention education information provided.  The family member/significant other agrees to remove the items of safety concern listed above.  Maeola Sarah 06/01/2017, 4:03 PM

## 2017-06-01 NOTE — Tx Team (Addendum)
Interdisciplinary Treatment and Diagnostic Plan Update  06/01/2017 Time of Session: 0945am Amanda Hall MRN: [id number]  Principal Diagnosis: <principal problem not specified>  Secondary Diagnoses: Active Problems:   Major depressive disorder, recurrent severe without psychotic features (HCC)   Current Medications:  Current Facility-Administered Medications  Medication Dose Route Frequency Provider Last Rate Last Dose  . acetaminophen (TYLENOL) tablet 650 mg  650 mg Oral Q6H PRN Charm Rings, NP   650 mg at 05/31/17 2002  . alum & mag hydroxide-simeth (MAALOX/MYLANTA) 200-200-20 MG/5ML suspension 30 mL  30 mL Oral Q4H PRN Charm Rings, NP   30 mL at 06/02/17 1733  . calcium-vitamin D (OSCAL WITH D) 500-200 MG-UNIT per tablet 1 tablet  1 tablet Oral Q breakfast Antonieta Pert, MD   1 tablet at 06/06/17 [phone]  . fluvoxaMINE (LUVOX) tablet 150 mg  150 mg Oral QHS Antonieta Pert, MD   150 mg at 06/05/17 2115  . hydrocortisone (ANUSOL-HC) 2.5 % rectal cream   Rectal TID Antonieta Pert, MD      . hydrocortisone cream 1 %   Topical BID PRN Cobos, Rockey Situ, MD      . loratadine (CLARITIN) tablet 10 mg  10 mg Oral Daily Antonieta Pert, MD   10 mg at 06/06/17 0840  . LORazepam (ATIVAN) tablet 1 mg  1 mg Oral TID Antonieta Pert, MD   1 mg at 06/06/17 1210  . magic mouthwash w/lidocaine  10 mL Oral QID Antonieta Pert, MD   10 mL at 06/06/17 1210  . magnesium hydroxide (MILK OF MAGNESIA) suspension 30 mL  30 mL Oral Daily PRN Charm Rings, NP      . megestrol (MEGACE) tablet 40 mg  40 mg Oral Daily Antonieta Pert, MD   40 mg at 06/06/17 0840  . mirtazapine (REMERON) tablet 45 mg  45 mg Oral QHS Antonieta Pert, MD   45 mg at 06/05/17 2116  . OXcarbazepine (TRILEPTAL) tablet 225 mg  225 mg Oral QHS Antonieta Pert, MD   225 mg at 06/05/17 2116  . OXcarbazepine (TRILEPTAL) tablet 75 mg  75 mg Oral BID Cobos, Rockey Situ, MD   75 mg at 06/06/17 0841  .  polyethylene glycol (MIRALAX / GLYCOLAX) packet 8 g  8 g Oral Daily Antonieta Pert, MD   8 g at 06/03/17 1191  . potassium chloride (KLOR-CON) packet 40 mEq  40 mEq Oral Once Charm Rings, NP      . QUEtiapine (SEROQUEL) tablet 50 mg  50 mg Oral QHS Antonieta Pert, MD       PTA Medications: Medications Prior to Admission  Medication Sig Dispense Refill Last Dose  . atorvastatin (LIPITOR) 20 MG tablet TAKE 1 TABLET BY MOUTH ONCE DAILY 90 tablet 1 Past Week at Unknown time  . FLUoxetine (PROZAC) 20 MG capsule Take 1 capsule (20 mg total) by mouth daily. 30 capsule 0 Past Week at Unknown time  . gabapentin (NEURONTIN) 100 MG capsule Take 1 capsule (100 mg total) by mouth 3 (three) times daily. 90 capsule 0 Past Week at Unknown time  . hydrocortisone cream 1 % Apply to affected area 2 times daily 15 g 0 Past Month at Unknown time  . LORazepam (ATIVAN) 1 MG tablet Take 1 tablet (1 mg total) by mouth 2 (two) times daily. 60 tablet 3 Past Week at Unknown time  . polyethylene glycol (MIRALAX) packet Take 17 g by mouth  2 (two) times daily. 28 each 0 Past Month at Unknown time    Patient Stressors: Financial difficulties Health problems  Patient Strengths: General fund of knowledge Supportive family/friends  Treatment Modalities: Medication Management, Group therapy, Case management,  1 to 1 session with clinician, Psychoeducation, Recreational therapy.   Physician Treatment Plan for Primary Diagnosis: <principal problem not specified> Long Term Goal(s): Improvement in symptoms so as ready for discharge Improvement in symptoms so as ready for discharge   Short Term Goals: Ability to identify changes in lifestyle to reduce recurrence of condition will improve Ability to maintain clinical measurements within normal limits will improve Ability to identify changes in lifestyle to reduce recurrence of condition will improve Ability to verbalize feelings will improve Ability to disclose  and discuss suicidal ideas Ability to demonstrate self-control will improve Ability to identify and develop effective coping behaviors will improve Ability to maintain clinical measurements within normal limits will improve  Medication Management: Evaluate patient's response, side effects, and tolerance of medication regimen.  Therapeutic Interventions: 1 to 1 sessions, Unit Group sessions and Medication administration.  Evaluation of Outcomes: Progressing  Physician Treatment Plan for Secondary Diagnosis: Active Problems:   Major depressive disorder, recurrent severe without psychotic features (HCC)  Long Term Goal(s): Improvement in symptoms so as ready for discharge Improvement in symptoms so as ready for discharge   Short Term Goals: Ability to identify changes in lifestyle to reduce recurrence of condition will improve Ability to maintain clinical measurements within normal limits will improve Ability to identify changes in lifestyle to reduce recurrence of condition will improve Ability to verbalize feelings will improve Ability to disclose and discuss suicidal ideas Ability to demonstrate self-control will improve Ability to identify and develop effective coping behaviors will improve Ability to maintain clinical measurements within normal limits will improve     Medication Management: Evaluate patient's response, side effects, and tolerance of medication regimen.  Therapeutic Interventions: 1 to 1 sessions, Unit Group sessions and Medication administration.  Evaluation of Outcomes: Progressing   RN Treatment Plan for Primary Diagnosis: <principal problem not specified> Long Term Goal(s): Knowledge of disease and therapeutic regimen to maintain health will improve  Short Term Goals: Ability to verbalize frustration and anger appropriately will improve, Ability to participate in decision making will improve, Ability to verbalize feelings will improve, Ability to identify and  develop effective coping behaviors will improve and Compliance with prescribed medications will improve  Medication Management: RN will administer medications as ordered by provider, will assess and evaluate patient's response and provide education to patient for prescribed medication. RN will report any adverse and/or side effects to prescribing provider.  Therapeutic Interventions: 1 on 1 counseling sessions, Psychoeducation, Medication administration, Evaluate responses to treatment, Monitor vital signs and CBGs as ordered, Perform/monitor CIWA, COWS, AIMS and Fall Risk screenings as ordered, Perform wound care treatments as ordered.  Evaluation of Outcomes: Progressing   LCSW Treatment Plan for Primary Diagnosis: <principal problem not specified> Long Term Goal(s): Safe transition to appropriate next level of care at discharge, Engage patient in therapeutic group addressing interpersonal concerns.  Short Term Goals: Engage patient in aftercare planning with referrals and resources, Increase social support, Increase ability to appropriately verbalize feelings, Increase emotional regulation, Facilitate acceptance of mental health diagnosis and concerns, Facilitate patient progression through stages of change regarding substance use diagnoses and concerns, Identify triggers associated with mental health/substance abuse issues and Increase skills for wellness and recovery  Therapeutic Interventions: Assess for all discharge needs, 1 to  1 time with Child psychotherapist, Explore available resources and support systems, Assess for adequacy in community support network, Educate family and significant other(s) on suicide prevention, Complete Psychosocial Assessment, Interpersonal group therapy.  Evaluation of Outcomes: Progressing   Progress in Treatment: Attending groups:Yes Participating in groups: No. Taking medication as prescribed: Yes. Toleration medication: Yes. Family/Significant other contact  made: Yes, individual(s) contacted:  sister Patient understands diagnosis: Yes. Discussing patient identified problems/goals with staff: Yes. Medical problems stabilized or resolved: Yes. Denies suicidal/homicidal ideation: Yes. Issues/concerns per patient self-inventory: No. Other:   New problem(s) identified: None   New Short Term/Long Term Goal(s):medication stabilization, elimination of SI thoughts, development of comprehensive mental wellness plan.   Patient Goals: "I want help with my anxiety. It is to a point where it is overwhelming and I worry about everything".   Discharge Plan or Barriers: CSW will continue to assess for an appropriate discharge plan.   Reason for Continuation of Hospitalization: Anxiety Depression Medication stabilization  Estimated Length of Stay: 1-2 days.  Attendees: Patient: 06/06/2017   Physician: Dr Landry Mellow, MD 06/06/2017   Nursing: Sherryl Manges, RN 06/06/2017   RN Care Manager: 06/06/2017   Social Worker: Daleen Squibb, LCSW 06/06/2017   Recreational Therapist:  06/06/2017   Other:  06/06/2017   Other:  06/06/2017   Other: 06/06/2017        Scribe for Treatment Team: Lorri Frederick, LCSW 06/01/2017 10:55 AM

## 2017-06-02 ENCOUNTER — Ambulatory Visit (HOSPITAL_COMMUNITY)
Admit: 2017-06-02 | Discharge: 2017-06-02 | Disposition: A | Discharge status: Home / Self Care | Payer: Medicare Other | Attending: Psychiatry | Admitting: Psychiatry

## 2017-06-02 DIAGNOSIS — R1310 Dysphagia, unspecified: Secondary | ICD-10-CM | POA: Insufficient documentation

## 2017-06-02 DIAGNOSIS — R682 Dry mouth, unspecified: Secondary | ICD-10-CM | POA: Insufficient documentation

## 2017-06-02 MED ORDER — POLYETHYLENE GLYCOL 3350 17 G PO PACK
8.0000 g | PACK | Freq: Every day | ORAL | Status: DC
  Administered 2017-06-03 – 2017-06-09 (×2): 8 g via ORAL
  Filled 2017-06-02 (×10): qty 1

## 2017-06-02 MED ORDER — MAGIC MOUTHWASH W/LIDOCAINE
10.0000 mL | Freq: Four times a day (QID) | ORAL | Status: DC
  Administered 2017-06-02 – 2017-06-09 (×16): 10 mL via ORAL
  Filled 2017-06-02 (×40): qty 10

## 2017-06-02 MED ORDER — LIDOCAINE VISCOUS HCL 2 % MT SOLN
15.0000 mL | OROMUCOSAL | Status: DC | PRN
  Filled 2017-06-02: qty 15

## 2017-06-02 MED ORDER — LORATADINE 10 MG PO TABS
10.0000 mg | ORAL_TABLET | Freq: Every day | ORAL | Status: DC
  Administered 2017-06-02 – 2017-06-09 (×8): 10 mg via ORAL
  Filled 2017-06-02 (×11): qty 1

## 2017-06-02 MED ORDER — FLUOXETINE HCL 20 MG PO CAPS
40.0000 mg | ORAL_CAPSULE | Freq: Every day | ORAL | Status: DC
  Administered 2017-06-03: 40 mg via ORAL
  Filled 2017-06-02 (×3): qty 2

## 2017-06-02 NOTE — [facility] Group Notes (Signed)
[facility] Group Notes:  Nursing Education  Date:  06/02/2017  Time:  4:00 PM  Type of Therapy:  Nurse Education  Participation Level:  Did Not Attend  Participation Quality:    Affect:    Cognitive:    Insight:    Engagement in Group:   Modes of Intervention:  Discussion and Education  Summary of Progress/Problems: Patient did not attend group; patient was working with PT at the time.  Rae Lips Ulyses Panico 06/02/2017, 6:39 PM

## 2017-06-02 NOTE — Progress Notes (Addendum)
Patient ID: Amanda Hall, female   DOB: Jul 22, 1950, 67 y.o.   MRN: [id number]  1:1-   D: Pt currently sitting on the commode. Sitter is currently at patients side. Pt reports ongoing concerns about bowel movement characteristics. Recent bm noted as WDL, smooth in characteristic.   A: Emotional support given, pt given opportunity to express concerns. Sitter encouraged to share any pertinent observations.   R: Pt remains safe on a 1:1 per MD orders. Pt continues to be physically anxious and express somatic delusions. Pt in no current distress. Will continue to monitor.

## 2017-06-02 NOTE — Progress Notes (Signed)
Psychoeducational Group Note  Date:  06/02/2017 Time:  2125  Group Topic/Focus:  Wrap-Up Group:   The focus of this group is to help patients review their daily goal of treatment and discuss progress on daily workbooks.  Participation Level: Did Not Attend  Participation Quality:  Not Applicable  Affect:  Not Applicable  Cognitive:  Not Applicable  Insight:  Not Applicable  Engagement in Group: Not Applicable  Additional Comments:  The patient did not attend group this evening.   Hazle Coca S 06/02/2017, 9:25 PM

## 2017-06-02 NOTE — Evaluation (Signed)
Physical Therapy Evaluation Patient Details Name: Amanda Hall MRN: [id number] DOB: 12-Feb-1950 Today's Date: 06/02/2017   History of Present Illness  PT admit with significant history of anxiety and depression. With great weight loss recetnly due to loss of appetite.   Clinical Impression  Pt with generalized weakness in both UEs and LEs exhibiting functional limitations with mobility and balance. Would benefit from PT to increase strength and balance to increase safety and activity tolerance for everyday mobility. Encouraged pt to ambulate to and from meals utilizing the RW or holding to a staff member. We will continue to see patient at least 2x a week minimum, and ordered OT to assess UE strength , activity tolerance and balance with ADLs as well.     Follow Up Recommendations Home health PT(depending on pt's progress while at [facility])    Equipment Recommendations  None recommended by PT    Recommendations for Other Services       Precautions / Restrictions Precautions Precautions: Fall(pt on fall precautions with sitter )      Mobility  Bed Mobility Overal bed mobility: Independent                Transfers Overall transfer level: Modified independent                  Ambulation/Gait Ambulation/Gait assistance: Min guard Ambulation Distance (Feet): 100 Feet(2 times ) Assistive device: Rolling walker (2 wheeled) Gait Pattern/deviations: Step-through pattern;Narrow base of support;Decreased stride length Gait velocity: slow   General Gait Details: slow garded gait. Tried without RW and with RW to see which one made her feel more "safe" and comfortable, however hard to tell which one did. She noticed every small thing that she didn't like about the RW , however when given the option to walk with just holding to [place], she was fearful of that as well, however performed it fairly well.    Stairs            Wheelchair Mobility    Modified Rankin (Stroke Patients  Only)       Balance Overall balance assessment: Mild deficits observed, not formally tested                                           Pertinent Vitals/Pain Pain Assessment: No/denies pain(only states that at times her low back will begin to hurt if she is on her feet too much. )    Home Living Family/patient expects to be discharged to:: Private residence Living Arrangements: Other relatives(lives with sister) Available Help at Discharge: Family Type of Home: House         Home Equipment: Walker - 2 wheels Additional Comments: pt states her sister has one she could use if needed. Pt hopes she will not have to use one.     Prior Function Level of Independence: Independent         Comments: Pt states she was very independent, has 2 dogs and likely doing general exercises. She comments on not knowing how she got this bad, almost in awe.      Hand Dominance        Extremity/Trunk Assessment        Lower Extremity Assessment Lower Extremity Assessment: Generalized weakness;RLE deficits/detail;LLE deficits/detail(UEs and LEs tolerated light general functioning activities with breaks, however with UE would shake a lot using RW and also when  attempting to do over head UE exercises. Very weak in UE and LEs.  ) RLE Deficits / Details: grossly 4-/5 LLE Deficits / Details: grossly 4-/5       Communication      Cognition Arousal/Alertness: Awake/alert Behavior During Therapy: WFL for tasks assessed/performed Overall Cognitive Status: Within Functional Limits for tasks assessed                                        General Comments General comments (skin integrity, edema, etc.): would benefit from balance execises to increase tolerance, turning, and stepping strategies to make pt less fearful of falling.     Exercises Other Exercises Other Exercises: worked on standing UE exercises : all 10 times each BLE: marching R/L (alertnating  hip flexion) holding one rail in hallway, toe and heel raise holding one rail in hallway, hip ABduction holding with B UEs, seated knee extension holds with 5 ankle pumps , 5x times each. UE over head shoudler elevation and elbow flexion/extension 10x.    Assessment/Plan    PT Assessment Patient needs continued PT services  PT Problem List Decreased strength;Decreased activity tolerance;Decreased balance;Decreased mobility       PT Treatment Interventions DME instruction;Gait training;Functional mobility training;Therapeutic activities;Therapeutic exercise;Balance training;Patient/family education    PT Goals (Current goals can be found in the Care Plan section)  Acute Rehab PT Goals Patient Stated Goal: I want to get better  PT Goal Formulation: With patient Time For Goal Achievement: 06/16/17 Potential to Achieve Goals: Good    Frequency Min 2X/week(minimum freq of 2x /wk , we will try to see pt more often when possible. )   Barriers to discharge        Co-evaluation               AM-PAC PT "6 Clicks" Daily Activity  Outcome Measure Difficulty turning over in bed (including adjusting bedclothes, sheets and blankets)?: None Difficulty moving from lying on back to sitting on the side of the bed? : None Difficulty sitting down on and standing up from a chair with arms (e.g., wheelchair, bedside commode, etc,.)?: A Little   Help needed walking in hospital room?: A Lot Help needed climbing 3-5 steps with a railing? : A Lot 6 Click Score: 15    End of Session   Activity Tolerance: Patient tolerated treatment well Patient left: in chair;with nursing/sitter in room Nurse Communication: Mobility status(encouraged pt to walk to every meal with RW or holding to staff member. She was fearful of the lone line, however the sitter reassurred her they would allow her to be at the front of the line. ) PT Visit Diagnosis: Muscle weakness (generalized) (M62.81);Unsteadiness on feet  (R26.81)    Time: 1610-9604 PT Time Calculation (min) (ACUTE ONLY): 26 min   Charges:   PT Evaluation $PT Eval Low Complexity: 1 Low PT Treatments $Gait Training: 8-22 mins $Therapeutic Exercise: 8-22 mins   PT G Codes:        Marella Bile, PT Pager: [phone] 06/02/2017   Ashlley Booher, Clois Dupes 06/02/2017, 5:26 PM

## 2017-06-02 NOTE — Progress Notes (Addendum)
1:1 progress note:  Pt resting in bed with eyes closed.  Respirations even and unlabored.  Continue 1:1 for safety d/t high fall risk.  Sitter at bedside.  Pt remains safe.

## 2017-06-02 NOTE — Progress Notes (Signed)
Patient ID: Amanda Hall, female   DOB: 20-Jun-1950, 67 y.o.   MRN: [id number]  1:1 Nursing Progress Note  D: Patient has been isolative to her room this morning. Patient complains of fatigue but reports she slept well. Patient ate minimal breakfast and requested to keep sleeping in bed. Patient complaint with scheduled medications. Later this morning patient did awaken and presented with anxiety. 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 Patient to be sent to [facility] for Barium Swallow with sitter. Patient remains in her room and is encouraged to walk to maintain her strength. Environment is secured. Sitter observed with patient per [employer].  A: Patient remains on 1:1 observation per provider orders. High fall risk precautions in place. Patient safety monitored with q15 minute safety checks. Vitals and labs monitored. Daily weight obtained.  R: Patient remains safe on the unit at this time. Will continue to monitor with 1:1 sitter, q15 min safety checks & q4 hour nursing assessments.

## 2017-06-02 NOTE — Progress Notes (Signed)
Patient ID: Amanda Hall, female   DOB: 09-15-1950, 67 y.o.   MRN: [id number]  1:1 Nursing Progress Note  D: Patient ate dinner in the cafeteria this evening. Patient has been up ambulating with her sitter and PT. Patient has been provided medications as ordered. Environment is secured. Sitter observed with patient.  A: Patient remains on 1:1 observation per provider orders. High fall risk precautions in place. Patient safety monitored with q15 minute safety checks.  R: Patient remains safe on the unit at this time. Will continue to monitor with 1:1 sitter, q15 min safety checks & q4 hour nursing assessments.

## 2017-06-02 NOTE — Progress Notes (Signed)
Pt is lying in bed with eyes closed.  No distress observed.  Respirations even and unlabored.  The sitter reports that the pt awoke briefly a few minutes ago to ask about the time, but she went back to sleep without any problems.  Continue 1:1 for safety.  Sitter at bedside.  Pt is safe at this time.

## 2017-06-02 NOTE — Progress Notes (Signed)
Patient ID: Amanda Hall, female   DOB: [birth year], 67 y.o.   MRN: [id number]  1:1 Nursing Progress Note  D: Patient has returned from [facility] with sitter. Patient was provided her medications. Patient refused her scheduled Miralax. Patient provided lunch and is resting in her room. NPO discontinued. Environment is secured. Sitter observed with patient.  A: Patient remains on 1:1 observation per provider orders. High fall risk precautions in place. Patient safety monitored with q15 minute safety checks. Medications provided. Vital signs and labs monitored.  R: Patient remains safe on the unit at this time. Will continue to monitor with 1:1 sitter, q15 min safety checks & q4 hour nursing assessments.

## 2017-06-02 NOTE — Progress Notes (Signed)
[facility] MD Progress Note  06/02/2017 10:45 AM Amanda Hall  MRN:  [id number] Subjective: Patient is seen and examined.  Patient is a 67 year old female with a past psychiatric history significant for anxiety and depression is seen in follow-up.  Her anxiety is a little bit better today.  She is a little bit more sedated than usual.  She is still very somatically focused.  She continues to complain of some dysphasia.  She is almost alexathymic at times trying to describe things.  Her speech is more coordinated today than it has been.  He denied suicidal ideation.  Social work spoke to her sister yesterday, and they are more than willing to take her back home, but I am not sure they understand the physical needs that she has.  I have reordered the physical therapy consult, and asked them to come work with her on a daily basis. Principal Problem: <principal problem not specified> Diagnosis:   Patient Active Problem List   Diagnosis Date Noted  . Generalized anxiety disorder [F41.1] 05/26/2017  . Major depressive disorder, recurrent severe without psychotic features (HCC) [F33.2] 05/26/2017  . Hypercholesterolemia [E78.00] 03/22/2017  . Nonspecific (abnormal) findings on radiological and other examination of body structure [793] 12/26/2006  . NONSPECIFIC ABNORM FIND RAD&OTH EXAM LUNG FIELD [R93.0] 12/26/2006  . ANEMIA-IRON DEFICIENCY [D50.9] 12/15/2006  . Depression [F32.9] 12/15/2006  . Allergic rhinitis [J30.9] 12/15/2006   Total Time spent with patient: 20 minutes  Past Psychiatric History: See admission H&P  Past Medical History:  Past Medical History:  Diagnosis Date  . Allergy   . Anemia   . Anxiety   . Depression   . Hemorrhoids     Past Surgical History:  Procedure Laterality Date  . arm surgery     Left  . TUBAL LIGATION     Family History:  Family History  Problem Relation Age of Onset  . Stroke Mother   . Hypertension Sister   . Cancer Neg Hx        breat and colon hx  .  Diabetes Neg Hx        family   Family Psychiatric  History: See admission H&P Social History:  Social History   Substance and Sexual Activity  Alcohol Use No     Social History   Substance and Sexual Activity  Drug Use No    Social History   Socioeconomic History  . Marital status: Divorced    Spouse name: Not on file  . Number of children: Not on file  . Years of education: Not on file  . Highest education level: Not on file  Occupational History  . Not on file  Social Needs  . Financial resource strain: Not on file  . Food insecurity:    Worry: Not on file    Inability: Not on file  . Transportation needs:    Medical: Not on file    Non-medical: Not on file  Tobacco Use  . Smoking status: Never Smoker  . Smokeless tobacco: Never Used  Substance and Sexual Activity  . Alcohol use: No  . Drug use: No  . Sexual activity: Not Currently  Lifestyle  . Physical activity:    Days per week: Not on file    Minutes per session: Not on file  . Stress: Not on file  Relationships  . Social connections:    Talks on phone: Not on file    Gets together: Not on file    Attends religious service:  Not on file    Active member of club or organization: Not on file    Attends meetings of clubs or organizations: Not on file    Relationship status: Not on file  Other Topics Concern  . Not on file  Social History Narrative  . Not on file   Additional Social History:                         Sleep: Good  Appetite:  Good  Current Medications: Current Facility-Administered Medications  Medication Dose Route Frequency Provider Last Rate Last Dose  . acetaminophen (TYLENOL) tablet 650 mg  650 mg Oral Q6H PRN Charm Rings, NP   650 mg at 05/31/17 2002  . alum & mag hydroxide-simeth (MAALOX/MYLANTA) 200-200-20 MG/5ML suspension 30 mL  30 mL Oral Q4H PRN Charm Rings, NP      . feeding supplement (ENSURE ENLIVE) (ENSURE ENLIVE) liquid 237 mL  237 mL Oral BID BM  Cobos, Rockey Situ, MD   237 mL at 06/01/17 1000  . FLUoxetine (PROZAC) capsule 20 mg  20 mg Oral Daily Antonieta Pert, MD   20 mg at 06/02/17 0804  . hydrocortisone (ANUSOL-HC) 2.5 % rectal cream   Rectal TID Antonieta Pert, MD      . hydrocortisone cream 1 %   Topical BID PRN Cobos, Rockey Situ, MD      . lidocaine (XYLOCAINE) 2 % viscous mouth solution 15 mL  15 mL Mouth/Throat Q4H PRN Antonieta Pert, MD      . loratadine (CLARITIN) tablet 10 mg  10 mg Oral Daily Antonieta Pert, MD      . LORazepam (ATIVAN) tablet 1 mg  1 mg Oral TID Antonieta Pert, MD   1 mg at 06/02/17 0804  . magnesium hydroxide (MILK OF MAGNESIA) suspension 30 mL  30 mL Oral Daily PRN Charm Rings, NP      . megestrol (MEGACE) tablet 40 mg  40 mg Oral Daily Antonieta Pert, MD   40 mg at 06/02/17 0804  . mirtazapine (REMERON) tablet 45 mg  45 mg Oral QHS Antonieta Pert, MD   45 mg at 06/01/17 2121  . OXcarbazepine (TRILEPTAL) tablet 225 mg  225 mg Oral QHS Antonieta Pert, MD   225 mg at 06/01/17 2122  . OXcarbazepine (TRILEPTAL) tablet 75 mg  75 mg Oral BID Cobos, Rockey Situ, MD   75 mg at 06/02/17 0804  . potassium chloride (KLOR-CON) packet 40 mEq  40 mEq Oral Once Charm Rings, NP        Lab Results: No results found for this or any previous visit (from the past 48 hour(s)).  Blood Alcohol level:  Lab Results  Component Value Date   ETH <10 05/25/2017   ETH <10 05/18/2017    Metabolic Disorder Labs: No results found for: HGBA1C, MPG No results found for: PROLACTIN Lab Results  Component Value Date   CHOL 189 03/22/2017   TRIG 218.0 (H) 03/22/2017   HDL 50.60 03/22/2017   CHOLHDL 4 03/22/2017   VLDL 43.6 (H) 03/22/2017   LDLCALC 226 (H) 07/13/2016    Physical Findings: AIMS: Facial and Oral Movements Muscles of Facial Expression: None, normal Lips and Perioral Area: None, normal Jaw: None, normal Tongue: None, normal,Extremity Movements Upper (arms, wrists, hands,  fingers): Mild Lower (legs, knees, ankles, toes): None, normal, Trunk Movements Neck, shoulders, hips: None, normal, Overall Severity Severity of  abnormal movements (highest score from questions above): Mild Incapacitation due to abnormal movements: None, normal Patient's awareness of abnormal movements (rate only patient's report): Aware, mild distress, Dental Status Current problems with teeth and/or dentures?: Yes Does patient usually wear dentures?: Yes  CIWA:  CIWA-Ar Total: 5 COWS:  COWS Total Score: 5  Musculoskeletal: Strength & Muscle Tone: decreased Gait & Station: unsteady Patient leans: N/A  Psychiatric Specialty Exam: Physical Exam  Constitutional: She is oriented to person, place, and time. She appears well-developed.  HENT:  Head: Normocephalic and atraumatic.  Respiratory: Effort normal.  Neurological: She is alert and oriented to person, place, and time.    ROS  Blood pressure (!) 121/54, pulse 91, temperature 98.2 F (36.8 C), temperature source Oral, resp. rate 18, height  (1.702 m), weight 56.7 kg (125 lb), SpO2 100 %.Body mass index is 19.58 kg/m.  General Appearance: Casual  Eye Contact:  Fair  Speech:  Normal Rate  Volume:  Decreased  Mood:  Anxious  Affect:  Congruent  Thought Process:  Coherent  Orientation:  Full (Time, Place, and Person)  Thought Content:  Rumination  Suicidal Thoughts:  No  Homicidal Thoughts:  No  Memory:  Immediate;   Fair  Judgement:  Impaired  Insight:  Fair  Psychomotor Activity:  Increased  Concentration:  Concentration: Fair  Recall:  [employer] of Knowledge:  Fair  Language:  Fair  Akathisia:  Negative  Handed:  Right  AIMS (if indicated):     Assets:  Desire for Improvement  ADL's:  Impaired  Cognition:  WNL  Sleep:  Number of Hours: 6.75     Treatment Plan Summary: Daily contact with patient to assess and evaluate symptoms and progress in treatment, Medication management and Plan Patient seen and  examined.  Patient is a 68 year old female with the above-stated past psychiatric history who is seen in follow-up.  She continues to slowly improve.  She is currently on lorazepam 1 mg p.o. 3 times daily, fluoxetine 20 mg p.o. daily, Trileptal 75 mg p.o. daily and 225 mg p.o. nightly, and Remeron 45 mg p.o. nightly.  Because of her continued complaints about dysphasia as well as her weight loss I am going to order a barium swallow.  Want to make sure that there is no other underlying issue that could be leading to her weight loss.  Additionally she complained of some nose bleeding, and Emina put some Zyrtec on in case she is having some allergy issues.  No other changes in her medications at this time.  Hopefully she will become a little bit more tolerant to the Trileptal, but if she remains sedated during the day and may have to get rid of the 75 or decrease her dosage back down to 150 at bedtime.  Antonieta Pert, MD 06/02/2017, 10:45 AM

## 2017-06-02 NOTE — B[facility] Group Notes (Signed)
Date: 06/02/17, 1315  Type: group therapy  Participate Level: Did not attend  Description: CSW conducted a check in group with the five participants who attended.  Discussion topics included managing conflict with family, boundaries, and seeking support for yourself.  Summary of progress:

## 2017-06-03 MED ORDER — ENSURE ENLIVE PO LIQD: 237.0000 mL | Freq: Two times a day (BID) | ORAL | Status: DC

## 2017-06-03 MED ORDER — FLUVOXAMINE MALEATE 50 MG PO TABS
25.0000 mg | ORAL_TABLET | Freq: Once | ORAL | Status: AC
  Administered 2017-06-03: 25 mg via ORAL
  Filled 2017-06-03: qty 1

## 2017-06-03 MED ORDER — HYDROCHLOROTHIAZIDE 12.5 MG PO CAPS
12.5000 mg | ORAL_CAPSULE | Freq: Once | ORAL | Status: AC
Start: 2017-06-03 — End: 2017-06-03
  Administered 2017-06-03: 12.5 mg via ORAL
  Filled 2017-06-03 (×2): qty 1

## 2017-06-03 MED ORDER — CALCIUM CARBONATE-VITAMIN D 500-200 MG-UNIT PO TABS
1.0000 | ORAL_TABLET | Freq: Every day | ORAL | Status: DC
  Administered 2017-06-04 – 2017-06-09 (×6): 1 via ORAL
  Filled 2017-06-03 (×9): qty 1

## 2017-06-03 MED ORDER — FLUVOXAMINE MALEATE 50 MG PO TABS
50.0000 mg | ORAL_TABLET | Freq: Every day | ORAL | Status: DC
Start: 2017-06-03 — End: 2017-06-04
  Administered 2017-06-03: 50 mg via ORAL
  Filled 2017-06-03 (×2): qty 1

## 2017-06-03 NOTE — Progress Notes (Signed)
D    Pt is anxious and depressed   She spent 45 minutes in the bathroom because she was worried about cleaning herself after she uses the bathroom  She continues to obsess over whatever comes to mind   She tends to want to isolate to her room A   Verbal support given   Firm directions given when pt starts obsessing    Medications administered and effectiveness monitored   Pt on 1:1 for safety R   Pt is safe at present time

## 2017-06-03 NOTE — Progress Notes (Signed)
[facility] MD Progress Note  06/03/2017 10:49 AM Amanda Hall  MRN:  [id number] Subjective: Patient is seen and examined.  Patient is a 67 year old female with a reported past psychiatric history significant for anxiety and depression.  She is seen in follow-up.  She stated this morning that she feels more anxious.  She still has a very difficult time verbalizing her complaints.  She still is focused on many somatic complaints.  Her swallowing study was completely negative yesterday.  She is derive no sense of comfort of that.  In examining her diagnosis and her symptoms I suspect that her rumination is really an almost obsessive-compulsive type disorder for somatic complaints.  She did admit today that she has been this way with anxiety for "years".  She denied any suicidal ideation.  She is eating more than she had been.  We discussed possible medication changes. Principal Problem: <principal problem not specified> Diagnosis:   Patient Active Problem List   Diagnosis Date Noted  . Generalized anxiety disorder [F41.1] 05/26/2017  . Major depressive disorder, recurrent severe without psychotic features (HCC) [F33.2] 05/26/2017  . Hypercholesterolemia [E78.00] 03/22/2017  . Nonspecific (abnormal) findings on radiological and other examination of body structure [793] 12/26/2006  . NONSPECIFIC ABNORM FIND RAD&OTH EXAM LUNG FIELD [R93.0] 12/26/2006  . ANEMIA-IRON DEFICIENCY [D50.9] 12/15/2006  . Depression [F32.9] 12/15/2006  . Allergic rhinitis [J30.9] 12/15/2006   Total Time spent with patient: 30 minutes  Past Psychiatric History: See admission H&P  Past Medical History:  Past Medical History:  Diagnosis Date  . Allergy   . Anemia   . Anxiety   . Depression   . Hemorrhoids     Past Surgical History:  Procedure Laterality Date  . arm surgery     Left  . TUBAL LIGATION     Family History:  Family History  Problem Relation Age of Onset  . Stroke Mother   . Hypertension Sister   . Cancer  Neg Hx        breat and colon hx  . Diabetes Neg Hx        family   Family Psychiatric  History: See admission H&P Social History:  Social History   Substance and Sexual Activity  Alcohol Use No     Social History   Substance and Sexual Activity  Drug Use No    Social History   Socioeconomic History  . Marital status: Divorced    Spouse name: Not on file  . Number of children: Not on file  . Years of education: Not on file  . Highest education level: Not on file  Occupational History  . Not on file  Social Needs  . Financial resource strain: Not on file  . Food insecurity:    Worry: Not on file    Inability: Not on file  . Transportation needs:    Medical: Not on file    Non-medical: Not on file  Tobacco Use  . Smoking status: Never Smoker  . Smokeless tobacco: Never Used  Substance and Sexual Activity  . Alcohol use: No  . Drug use: No  . Sexual activity: Not Currently  Lifestyle  . Physical activity:    Days per week: Not on file    Minutes per session: Not on file  . Stress: Not on file  Relationships  . Social connections:    Talks on phone: Not on file    Gets together: Not on file    Attends religious service: Not on file  Active member of club or organization: Not on file    Attends meetings of clubs or organizations: Not on file    Relationship status: Not on file  Other Topics Concern  . Not on file  Social History Narrative  . Not on file   Additional Social History:                         Sleep: Fair  Appetite:  Fair  Current Medications: Current Facility-Administered Medications  Medication Dose Route Frequency Provider Last Rate Last Dose  . acetaminophen (TYLENOL) tablet 650 mg  650 mg Oral Q6H PRN Charm Rings, NP   650 mg at 05/31/17 2002  . alum & mag hydroxide-simeth (MAALOX/MYLANTA) 200-200-20 MG/5ML suspension 30 mL  30 mL Oral Q4H PRN Charm Rings, NP   30 mL at 06/02/17 1733  . feeding supplement (ENSURE  ENLIVE) (ENSURE ENLIVE) liquid 237 mL  237 mL Oral BID BM Cobos, Rockey Situ, MD   237 mL at 06/01/17 1000  . fluvoxaMINE (LUVOX) tablet 50 mg  50 mg Oral QHS Antonieta Pert, MD      . hydrocortisone (ANUSOL-HC) 2.5 % rectal cream   Rectal TID Antonieta Pert, MD      . hydrocortisone cream 1 %   Topical BID PRN Cobos, Rockey Situ, MD      . loratadine (CLARITIN) tablet 10 mg  10 mg Oral Daily Antonieta Pert, MD   10 mg at 06/03/17 0802  . LORazepam (ATIVAN) tablet 1 mg  1 mg Oral TID Antonieta Pert, MD   1 mg at 06/03/17 0803  . magic mouthwash w/lidocaine  10 mL Oral QID Antonieta Pert, MD   10 mL at 06/03/17 0810  . magnesium hydroxide (MILK OF MAGNESIA) suspension 30 mL  30 mL Oral Daily PRN Charm Rings, NP      . megestrol (MEGACE) tablet 40 mg  40 mg Oral Daily Antonieta Pert, MD   40 mg at 06/02/17 0804  . mirtazapine (REMERON) tablet 45 mg  45 mg Oral QHS Antonieta Pert, MD   45 mg at 06/02/17 2210  . OXcarbazepine (TRILEPTAL) tablet 225 mg  225 mg Oral QHS Antonieta Pert, MD   225 mg at 06/02/17 2210  . OXcarbazepine (TRILEPTAL) tablet 75 mg  75 mg Oral BID Cobos, Rockey Situ, MD   75 mg at 06/03/17 0803  . polyethylene glycol (MIRALAX / GLYCOLAX) packet 8 g  8 g Oral Daily Antonieta Pert, MD   8 g at 06/03/17 1610  . potassium chloride (KLOR-CON) packet 40 mEq  40 mEq Oral Once Charm Rings, NP        Lab Results: No results found for this or any previous visit (from the past 48 hour(s)).  Blood Alcohol level:  Lab Results  Component Value Date   ETH <10 05/25/2017   ETH <10 05/18/2017    Metabolic Disorder Labs: No results found for: HGBA1C, MPG No results found for: PROLACTIN Lab Results  Component Value Date   CHOL 189 03/22/2017   TRIG 218.0 (H) 03/22/2017   HDL 50.60 03/22/2017   CHOLHDL 4 03/22/2017   VLDL 43.6 (H) 03/22/2017   LDLCALC 226 (H) 07/13/2016    Physical Findings: AIMS: Facial and Oral Movements Muscles of  Facial Expression: None, normal Lips and Perioral Area: None, normal Jaw: None, normal Tongue: None, normal,Extremity Movements Upper (arms, wrists, hands, fingers):  Mild Lower (legs, knees, ankles, toes): None, normal, Trunk Movements Neck, shoulders, hips: None, normal, Overall Severity Severity of abnormal movements (highest score from questions above): Mild Incapacitation due to abnormal movements: None, normal Patient's awareness of abnormal movements (rate only patient's report): Aware, mild distress, Dental Status Current problems with teeth and/or dentures?: Yes Does patient usually wear dentures?: Yes  CIWA:  CIWA-Ar Total: 5 COWS:  COWS Total Score: 5  Musculoskeletal: Strength & Muscle Tone: decreased Gait & Station: unsteady Patient leans: N/A  Psychiatric Specialty Exam: Physical Exam  Constitutional: She is oriented to person, place, and time.  HENT:  Head: Normocephalic and atraumatic.  Respiratory: Effort normal.  Neurological: She is alert and oriented to person, place, and time.    ROS  Blood pressure 136/69, pulse (!) 51, temperature 98.2 F (36.8 C), temperature source Oral, resp. rate 18, height  (1.702 m), weight 56.5 kg (124 lb 9 oz), SpO2 100 %.Body mass index is 19.51 kg/m.  General Appearance: Disheveled  Eye Contact:  Fair  Speech:  Blocked  Volume:  Decreased  Mood:  Anxious, Depressed and Dysphoric  Affect:  Congruent  Thought Process:  Disorganized  Orientation:  Full (Time, Place, and Person)  Thought Content:  Rumination  Suicidal Thoughts:  No  Homicidal Thoughts:  No  Memory:  Immediate;   Fair  Judgement:  Impaired  Insight:  Lacking  Psychomotor Activity:  Increased  Concentration:  Concentration: Fair  Recall:  [employer] of Knowledge:  Fair  Language:  Fair  Akathisia:  Negative  Handed:  Right  AIMS (if indicated):     Assets:  Desire for Improvement Social Support  ADL's:  Impaired  Cognition:  WNL  Sleep:  Number  of Hours: 5     Treatment Plan Summary: Daily contact with patient to assess and evaluate symptoms and progress in treatment, Medication management and Plan Patient is seen and examined.  Patient 67 year old female with the above-stated past psychiatric history seen in follow-up.  I suspect that she has obsessive-compulsive disorder.  Her rumination about physical issues really has been thinking about this diagnosis.  Does not really appear as though the fluoxetine has had any benefit, so may go on and stop that.  I am going to try fluvoxamine maleate her.  This should help her sleep as well as her obsessive-compulsive ideas.  We will continue the Remeron and other medications at their current dosages.  If she becomes oversedated by this I will probably cut back on the Trileptal.  Her swallow study was negative, and she now has Magic mouthwash for any of her oral discomfort.  Physical therapy has worked with her, and continues to work with her on a daily basis.  She  now has a walker.  Hopefully this will have a positive effect on her anxiety and rumination.  Antonieta Pert, MD 06/03/2017, 10:49 AM

## 2017-06-03 NOTE — Progress Notes (Addendum)
Patient ID: Amanda Hall, female   DOB: Dec 09, 1950, 67 y.o.   MRN: [id number]  1:1-   D: Pt currently sitting on the edge of the bed. Sitter is currently at patients side. Pt awake, reports concerns over swelling in her ankles and feet. Non pitting edema noted, good capillary refill.   A: Emotional support given, pt given opportunity to express concerns. Pt feet elevated in bed. Sitter encouraged to share any pertinent observations.   R: Pt remains safe on a 1:1 per MD orders. Pt in no current distress. Will continue to monitor and will pass on information to oncoming RN.

## 2017-06-03 NOTE — Progress Notes (Signed)
Patient ID: Amanda Hall, female   DOB: 1950-04-17, 67 y.o.   MRN: [id number] PER STATE REGULATIONS 482.30  THIS CHART WAS REVIEWED FOR MEDICAL NECESSITY WITH RESPECT TO THE PATIENT'S ADMISSION/ DURATION OF STAY.  NEXT REVIEW DATE: 06/07/2017  Willa Rough, RN, BSN CASE MANAGER

## 2017-06-03 NOTE — Plan of Care (Signed)
Pt progressing/not progressing in the following metrics. Pt continual redirects her thought pattern to tasks or events out of her control. Pt "on a loop". Pt denies any si/hi/ah/vh and verbally agrees to contract staff if these become apparent. Pt given explanation of new medications. Pt performed deep breathing exercises with this nurse. Pt currently on a 1:1. Pt safe on the unit. Will continue to monitor.  Problem: Activity: Goal: Sleeping patterns will improve Outcome: Progressing   Problem: Coping: Goal: Ability to verbalize frustrations and anger appropriately will improve Outcome: Progressing Goal: Ability to demonstrate self-control will improve Outcome: Progressing   Problem: Safety: Goal: Periods of time without injury will increase Outcome: Progressing   Problem: Education: Goal: Utilization of techniques to improve thought processes will improve Outcome: Progressing Goal: Knowledge of the prescribed therapeutic regimen will improve Outcome: Progressing   Problem: Activity: Goal: Imbalance in normal sleep/wake cycle will improve Outcome: Progressing   Problem: Health Behavior/Discharge Planning: Goal: Ability to make decisions will improve Outcome: Progressing   Problem: Role Relationship: Goal: Will demonstrate positive changes in social behaviors and relationships Outcome: Progressing   Problem: Safety: Goal: Ability to disclose and discuss suicidal ideas will improve Outcome: Progressing   Problem: Self-Concept: Goal: Will verbalize positive feelings about self Outcome: Progressing

## 2017-06-03 NOTE — Evaluation (Signed)
Occupational Therapy Evaluation Patient Details Name: Amanda Hall MRN: [id number] DOB: 12-07-1950 Today's Date: 06/03/2017    History of Present Illness PT admit with significant history of anxiety and depression. With great weight loss recetnly due to loss of appetite.    Clinical Impression   Met with pt, sitting on side of bed with sitter present in room. Pt reports new onset of BLE edema from calves to tops B feet. 2+ edema noted on tops of B feet, pitting edema not noted in B calves. Educated pt on importance of elevation and exercise to manage edema. Pt educated with dorsiflexion/plantarflexion and ankle rotation exercises when sitting/lying to manage edema. Pt continually educated on the importance of consistently engaging in functional mobility to manage edema- pt understands but shows increased worry. Pt needs consistent reinforcement to not rely on w/c this date, pt is able to engaged in functional mobility with HHA from staff, but will continue to ask to use w/c b/c "it's nice to just ride". Educated pt on importance of engaging in functional mobility consistently in daily routines to independently engage in BADL. Sit <> stand completed with supervision, functional mobility completed with HHA due to pt anxiety. Pt not wanting to use walker this date. Educated Chartered loss adjuster on importance of encouraging functional mobility to meals and throughout daily routine. Pt will benefit from continued OT services to increase functional mobility in ADL routines.    Follow Up Recommendations  Home health OT(pending pt progress in [facility])    Equipment Recommendations  Tub/shower bench    Recommendations for Other Services       Precautions / Restrictions Precautions Precautions: Fall;Other (comment)(1:1 sitter)      Mobility Bed Mobility Overal bed mobility: Independent                Transfers Overall transfer level: Modified independent Equipment used: 1 person hand held  assist             General transfer comment: pt refers HHA from OT over using walker, despite education    Balance Overall balance assessment: Mild deficits observed, not formally tested                                         ADL either performed or assessed with clinical judgement   ADL Overall ADL's : Needs assistance/impaired Eating/Feeding: Set up   Grooming: Supervision/safety Grooming Details (indicate cue type and reason): anticipate pt needing supervision 2/2 uncertainty within self and slight loss of balance Upper Body Bathing: Sitting;Minimal assistance Upper Body Bathing Details (indicate cue type and reason): Pt reports needing assist washing back when showering with staff (requirement in [facility]) Lower Body Bathing: Sitting/lateral leans;Minimal assistance   Upper Body Dressing : Set up   Lower Body Dressing: Set up Lower Body Dressing Details (indicate cue type and reason): anticipate increased time for decreased activity tolerance Toilet Transfer: Minimal assistance;Regular Glass blower/designer Details (indicate cue type and reason): Pt and tech reports that pt uses BR 50% with supervision assist and 50% with min A from tech Toileting- Clothing Manipulation and Hygiene: Set up [place] Manipulation Details (indicate cue type and reason): pt uses depend briefs and pads at baseline for leakage Tub/ Shower Transfer: Minimal assistance;Shower seat   Functional mobility during ADLs: Minimal assistance       Vision Baseline Vision/History: Wears glasses Wears Glasses: Reading only  Patient Visual Report: No change from baseline       Perception     Praxis      Pertinent Vitals/Pain Pain Assessment: Faces Faces Pain Scale: Hurts a little bit Pain Location: neck and back Pain Descriptors / Indicators: Discomfort;Aching Pain Intervention(s): Monitored during session;Repositioned     Hand Dominance Right   Extremity/Trunk  Assessment Upper Extremity Assessment Upper Extremity Assessment: Generalized weakness;RUE deficits/detail;LUE deficits/detail RUE Deficits / Details: grossly 4-/5 LUE Deficits / Details: grossly 4-/5   Lower Extremity Assessment Lower Extremity Assessment: Generalized weakness       Communication Communication Communication: No difficulties   Cognition Arousal/Alertness: Awake/alert Behavior During Therapy: WFL for tasks assessed/performed Overall Cognitive Status: Within Functional Limits for tasks assessed                                     General Comments  tested standing balance by reaching in different planes, pt somewhat fearful    Exercises     Shoulder Instructions      Home Living Family/patient expects to be discharged to:: Private residence Living Arrangements: Other relatives(sister, other family members all live close by) Available Help at Discharge: Family Type of Home: House Home Access: Stairs to enter Technical brewer of Steps: 1   Home Layout: One level     Bathroom Shower/Tub: Teacher, early years/pre: Standard     Home Equipment: Environmental consultant - 2 wheels   Additional Comments: pt reports walker in the home, never used herself and does not want to      Prior Functioning/Environment Level of Independence: Independent        Comments: 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, doing ADL/IADL (including driving) for herself, stopped when anxiety increased        OT Problem List: Decreased strength;Decreased knowledge of use of DME or AE;Increased edema;Decreased activity tolerance;Impaired balance (sitting and/or standing)      OT Treatment/Interventions: Self-care/ADL training;Therapeutic activities;Energy conservation;DME and/or AE instruction    OT Goals(Current goals can be found in the care plan section) Acute Rehab OT Goals Patient Stated Goal: I want to get back to where I was OT Goal Formulation: With  patient Time For Goal Achievement: 07/01/17 Potential to Achieve Goals: Good  OT Frequency: Min 3X/week   Barriers to D/C:            Co-evaluation              AM-PAC PT "6 Clicks" Daily Activity     Outcome Measure Help from another person eating meals?: A Little Help from another person taking care of personal grooming?: A Little Help from another person toileting, which includes using toliet, bedpan, or urinal?: A Little Help from another person bathing (including washing, rinsing, drying)?: A Little Help from another person to put on and taking off regular upper body clothing?: A Little Help from another person to put on and taking off regular lower body clothing?: A Little 6 Click Score: 18   End of Session Nurse Communication: Mobility status;Other (comment)(educated techs on importance of encouraging walking to meals)  Activity Tolerance: Patient tolerated treatment well Patient left: in bed;with nursing/sitter in room  OT Visit Diagnosis: Muscle weakness (generalized) (M62.81);Unsteadiness on feet (R26.81)                Time: 1340-1405 OT Time Calculation (min): 25 min Charges:  OT General Charges $  OT Visit: 1 Visit OT Evaluation $OT Eval Moderate Complexity: 1 Mod OT Treatments $Therapeutic Activity: 8-22 mins G-Codes:     Zenovia Jarred, MSOT, OTR/L   [place] 06/03/2017, 2:24 PM

## 2017-06-03 NOTE — Progress Notes (Signed)
Patient ID: Amanda Hall, female   DOB: 08/21/1950, 66 y.o.   MRN: [id number]  1:1-   D: Pt currently lying in bed with eyes closed. Respirations even and unlabored. Sitter is currently within arms reach of patient.  A: Visual assessment of patient and environmental safety completed. Sitter encouraged to share any pertinent observations. None to note. R: Pt remains safe on a 1:1 per MD orders. Pt in no current distress. Will continue to monitor.     

## 2017-06-03 NOTE — Progress Notes (Signed)
Patient ID: Amanda Hall, female   DOB: [birth year], 67 y.o.   MRN: [id number]  Pt currently presents with an anxious affect and helpless, manipulative behavior. Pt reports ongoing somatic complaints about hemorrhoids, stool, breathing and swallowing. Pt states "My anxiety is just so bad, I don't think anyone understands." Has concerns that she will be unable to get her Ativan when she leaves as she will be following up with a partner of her PCP who wanted to wean her off of the medicine. Pt seen properly ambulating with walker tonight. Pt reports good sleep with current medication regimen.   Pt provided with medications per providers orders. Pt's labs and vitals were monitored throughout the night. Pt given a 1:1 about emotional and mental status. Pt supported and encouraged to express concerns and questions. Pt educated on medications, relaxation techniques, coping skills for anxiety and food/nutrition. All need reinforcement.  Pt's safety ensured with 15 minute and environmental checks. Pt currently denies SI/HI and A/V hallucinations. Pt verbally agrees to seek staff if SI/HI or A/VH occurs and to consult with staff before acting on any harmful thoughts. Pt remains on a 1:1 for safety per MD orders. Will continue POC.

## 2017-06-03 NOTE — Progress Notes (Signed)
D: patient presented to med window. Pt anxious about medications being administered. Pt expresses anxiety and depression. Pt states "shes a bother". Pt redirected. Pt viewing having somatic response to education given on new medications. Pt given legal paperwork for IVC. Pt then proceeded to regress stating she will be in contempt of court if she doesn't show up on said date. Pt denies any si/hi/ah/vh and verbally agrees to approach staff if these become apparent. Pt on a 1:1.  A: pt given support and encouragement. Pt redirected and educated in deep breathing exercises. q28m checks implemented and continued. Pt continues to be on a 1:1. R: pt safe on the unit. Pt continues to regress with regard to anxiety revolving around medications, daily tasks, and issues out of her control. Will continue to monitor.

## 2017-06-04 MED ORDER — FLUVOXAMINE MALEATE 100 MG PO TABS
100.0000 mg | ORAL_TABLET | Freq: Every day | ORAL | Status: DC
  Administered 2017-06-04: 100 mg via ORAL
  Filled 2017-06-04 (×2): qty 1

## 2017-06-04 MED ORDER — HYDROCHLOROTHIAZIDE 12.5 MG PO CAPS
12.5000 mg | ORAL_CAPSULE | Freq: Once | ORAL | Status: AC
  Administered 2017-06-04: 12.5 mg via ORAL
  Filled 2017-06-04 (×2): qty 1

## 2017-06-04 MED ORDER — FLUVOXAMINE MALEATE 50 MG PO TABS
25.0000 mg | ORAL_TABLET | Freq: Once | ORAL | Status: AC
  Administered 2017-06-04: 25 mg via ORAL
  Filled 2017-06-04 (×2): qty 1

## 2017-06-04 NOTE — Progress Notes (Signed)
Pt again sitting on the toilet for 45 minutes saying she cant get herself clean but then isnt trying to do it and wont let staff help her   She said she could do it herself but then still not making any moves to wipe herself    She is anxious irritable and helpless   Pt is also tremulous    Pt on 1:1 for safety and is presently safe

## 2017-06-04 NOTE — Progress Notes (Signed)
D: Spent approximately 20 minutes with patient, encouraging her to take food, fluids and prescribed medications. Patient presents with little improvement since admission. Patient is anxious about tonight, fearing she may have a bowel movement or need to urinate and may not get the help needed. We have encouraged her that we are here to help her and staff will assist her. This will be passed on to next shift. Patient is shaking and has tremor that makes her unsteady on her feet. Patient using walker and has 1:1 with her for supervision. Patient paranoid about medications and refused them. Patient fearing that her fluids contain miralax which was prescribed, however, she refused it today.  A: Provided support and encouragement.  R: Patient refusing Ativan, Trileptal, MMW, and Hemorrhoid cream this evening.

## 2017-06-04 NOTE — Progress Notes (Signed)
D: Patient is anxious, tremulous, paranoid. Patient continues to be a high fall risk due to AIMS score of 17 from severe tremor/involuntary muscle movements. Patient is frail, has poor PO intake, requires frequent help toileting. Patient scoring 15 on CIWA due to tremor, anxiety. Requires persistent encouragement to take meds due to paranoia. Patient afraid of falling from getting to bathroom frequently while on HCTZ, despite 1:1 maintained. Patient suspicious of medications. A: RN educated on medications and side effects. Q15 minute checks maintained and verified. Patient encouraged to take PO fluids. Support and encouragement provided. R: Patient refusing to go to groups. Refusing some medications, including MMW, miralax. Patient compliant with Luvox, HCTZ, and Ativan.

## 2017-06-04 NOTE — Progress Notes (Signed)
D: Patient continues to be paranoid about medications, tremulous and high fall risk.  A: 1:1 maintained. R: Patient contracts for safety. Agreeable to staff supervision.

## 2017-06-04 NOTE — Progress Notes (Signed)
Writer has spoke with patient 1:1 after she came to front desk to request her Ativan that she had refused earlier. She was very tremulous and anxious reporting that she needs it. Writer informed her that it was scheduled for day shift and would see if dose could be moved to 2200. Writer informed her that NP on call did not move it d/t the fact that she would be receiving sleep medication along with other hs medications and may cause increased sedation. She was not happy and reported that she did not know how she was going to make it without her ativan. Writer encouraged her to attend group but she declined. She returned to her room and waited for medication pass at 2200. 1:1 continues and patient is safe.

## 2017-06-04 NOTE — [facility] Group Notes (Signed)
[facility] Group Notes:  (Nursing)  Date:  06/04/2017  Time:1:15 PM Type of Therapy:  Nurse Education  Participation Level:  Did Not Attend  Participation Quality:  Did not attend  Affect:  Did not attend  Cognitive:  Did not attend  Insight:  None  Engagement in Group:  None  Modes of Intervention:  Did not attend  Summary of Progress/Problems:  Shela Nevin 06/04/2017, 2:09 PM

## 2017-06-04 NOTE — Progress Notes (Signed)
Patient did not attend the evening wrap up group. Pt was in the bathroom being monitored 1:1 for safety.

## 2017-06-04 NOTE — Progress Notes (Signed)
[facility] MD Progress Note  06/04/2017 10:12 AM Amanda Hall  MRN:  [id number] Subjective: Patient is seen and examined.  Patient is a 67 year old female with a past psychiatric history significant for anxiety, depression, and suspected obsessive-compulsive disorder.  She is seen in follow-up.  She tolerated the Luvox fine last night.  She slept fairly well, but had to wake up and urinate on several occasions.  She had lower extremity edema that was causing her discomfort.  I gave her the option of either compression hose or diuretics, and she preferred the pill.  Unfortunately she urinated throughout the night.  She still has some lower extremity edema, and we discussed compression hose again versus diuretics.  She is unable to make a decision on this.  I told her we would give her the low-dose hydrochlorothiazide this morning, and that hopefully she would not have to get up and pee during the night.  I also told her we were going to increase the Luvox 200 mg p.o. nightly.  She remains very somatic, very suspicious of somatic complaints as well as medications.  She is fearful of side effects.  She continues to be worried about the MiraLAX, and she is suffering some constipation.  Nursing staff reported that she did have some bleeding on her toilet paper this morning. Principal Problem: <principal problem not specified> Diagnosis:   Patient Active Problem List   Diagnosis Date Noted  . Generalized anxiety disorder [F41.1] 05/26/2017  . Major depressive disorder, recurrent severe without psychotic features (HCC) [F33.2] 05/26/2017  . Hypercholesterolemia [E78.00] 03/22/2017  . Nonspecific (abnormal) findings on radiological and other examination of body structure [793] 12/26/2006  . NONSPECIFIC ABNORM FIND RAD&OTH EXAM LUNG FIELD [R93.0] 12/26/2006  . ANEMIA-IRON DEFICIENCY [D50.9] 12/15/2006  . Depression [F32.9] 12/15/2006  . Allergic rhinitis [J30.9] 12/15/2006   Total Time spent with patient: 20  minutes  Past Psychiatric History: See admission H&P  Past Medical History:  Past Medical History:  Diagnosis Date  . Allergy   . Anemia   . Anxiety   . Depression   . Hemorrhoids     Past Surgical History:  Procedure Laterality Date  . arm surgery     Left  . TUBAL LIGATION     Family History:  Family History  Problem Relation Age of Onset  . Stroke Mother   . Hypertension Sister   . Cancer Neg Hx        breat and colon hx  . Diabetes Neg Hx        family   Family Psychiatric  History: See admission H&P Social History:  Social History   Substance and Sexual Activity  Alcohol Use No     Social History   Substance and Sexual Activity  Drug Use No    Social History   Socioeconomic History  . Marital status: Divorced    Spouse name: Not on file  . Number of children: Not on file  . Years of education: Not on file  . Highest education level: Not on file  Occupational History  . Not on file  Social Needs  . Financial resource strain: Not on file  . Food insecurity:    Worry: Not on file    Inability: Not on file  . Transportation needs:    Medical: Not on file    Non-medical: Not on file  Tobacco Use  . Smoking status: Never Smoker  . Smokeless tobacco: Never Used  Substance and Sexual Activity  . Alcohol  use: No  . Drug use: No  . Sexual activity: Not Currently  Lifestyle  . Physical activity:    Days per week: Not on file    Minutes per session: Not on file  . Stress: Not on file  Relationships  . Social connections:    Talks on phone: Not on file    Gets together: Not on file    Attends religious service: Not on file    Active member of club or organization: Not on file    Attends meetings of clubs or organizations: Not on file    Relationship status: Not on file  Other Topics Concern  . Not on file  Social History Narrative  . Not on file   Additional Social History:                         Sleep: Fair  Appetite:   Good  Current Medications: Current Facility-Administered Medications  Medication Dose Route Frequency Provider Last Rate Last Dose  . acetaminophen (TYLENOL) tablet 650 mg  650 mg Oral Q6H PRN Charm Rings, NP   650 mg at 05/31/17 2002  . alum & mag hydroxide-simeth (MAALOX/MYLANTA) 200-200-20 MG/5ML suspension 30 mL  30 mL Oral Q4H PRN Charm Rings, NP   30 mL at 06/02/17 1733  . calcium-vitamin D (OSCAL WITH D) 500-200 MG-UNIT per tablet 1 tablet  1 tablet Oral Q breakfast Antonieta Pert, MD   1 tablet at 06/04/17 [phone]  . fluvoxaMINE (LUVOX) tablet 100 mg  100 mg Oral QHS Antonieta Pert, MD      . fluvoxaMINE (LUVOX) tablet 25 mg  25 mg Oral Once Antonieta Pert, MD      . hydrochlorothiazide (MICROZIDE) capsule 12.5 mg  12.5 mg Oral Once Antonieta Pert, MD      . hydrocortisone (ANUSOL-HC) 2.5 % rectal cream   Rectal TID Antonieta Pert, MD      . hydrocortisone cream 1 %   Topical BID PRN Cobos, Rockey Situ, MD      . loratadine (CLARITIN) tablet 10 mg  10 mg Oral Daily Antonieta Pert, MD   10 mg at 06/04/17 9604  . LORazepam (ATIVAN) tablet 1 mg  1 mg Oral TID Antonieta Pert, MD   1 mg at 06/04/17 [phone]  . magic mouthwash w/lidocaine  10 mL Oral QID Antonieta Pert, MD   10 mL at 06/04/17 0839  . magnesium hydroxide (MILK OF MAGNESIA) suspension 30 mL  30 mL Oral Daily PRN Charm Rings, NP      . megestrol (MEGACE) tablet 40 mg  40 mg Oral Daily Antonieta Pert, MD   40 mg at 06/04/17 0839  . mirtazapine (REMERON) tablet 45 mg  45 mg Oral QHS Antonieta Pert, MD   45 mg at 06/03/17 2101  . OXcarbazepine (TRILEPTAL) tablet 225 mg  225 mg Oral QHS Antonieta Pert, MD   225 mg at 06/03/17 2100  . OXcarbazepine (TRILEPTAL) tablet 75 mg  75 mg Oral BID Cobos, Rockey Situ, MD   75 mg at 06/04/17 8119  . polyethylene glycol (MIRALAX / GLYCOLAX) packet 8 g  8 g Oral Daily Antonieta Pert, MD   8 g at 06/04/17 [phone]  . potassium chloride (KLOR-CON)  packet 40 mEq  40 mEq Oral Once Charm Rings, NP        Lab Results: No results found for this  or any previous visit (from the past 48 hour(s)).  Blood Alcohol level:  Lab Results  Component Value Date   ETH <10 05/25/2017   ETH <10 05/18/2017    Metabolic Disorder Labs: No results found for: HGBA1C, MPG No results found for: PROLACTIN Lab Results  Component Value Date   CHOL 189 03/22/2017   TRIG 218.0 (H) 03/22/2017   HDL 50.60 03/22/2017   CHOLHDL 4 03/22/2017   VLDL 43.6 (H) 03/22/2017   LDLCALC 226 (H) 07/13/2016    Physical Findings: AIMS: Facial and Oral Movements Muscles of Facial Expression: None, normal Lips and Perioral Area: None, normal Jaw: None, normal Tongue: None, normal,Extremity Movements Upper (arms, wrists, hands, fingers): Moderate Lower (legs, knees, ankles, toes): None, normal, Trunk Movements Neck, shoulders, hips: None, normal, Overall Severity Severity of abnormal movements (highest score from questions above): Moderate Incapacitation due to abnormal movements: None, normal Patient's awareness of abnormal movements (rate only patient's report): Aware, mild distress, Dental Status Current problems with teeth and/or dentures?: Yes Does patient usually wear dentures?: Yes  CIWA:  CIWA-Ar Total: 5 COWS:  COWS Total Score: 5  Musculoskeletal: Strength & Muscle Tone: decreased Gait & Station: unsteady Patient leans: N/A  Psychiatric Specialty Exam: Physical Exam  Constitutional: She is oriented to person, place, and time.  HENT:  Head: Normocephalic and atraumatic.  Respiratory: Effort normal.  Neurological: She is alert and oriented to person, place, and time.    ROS  Blood pressure 112/68, pulse (!) 106, temperature 97.6 F (36.4 C), temperature source Oral, resp. rate 18, height  (1.702 m), weight 59 kg (130 lb), SpO2 100 %.Body mass index is 20.36 kg/m.  General Appearance: Disheveled  Eye Contact:  Minimal  Speech:  Slow   Volume:  Decreased  Mood:  Anxious and Depressed  Affect:  Congruent  Thought Process:  Goal Directed  Orientation:  Full (Time, Place, and Person)  Thought Content:  Obsessions and Rumination  Suicidal Thoughts:  No  Homicidal Thoughts:  No  Memory:  Immediate;   Fair  Judgement:  Impaired  Insight:  Lacking  Psychomotor Activity:  Increased  Concentration:  Concentration: Fair  Recall:  [employer] of Knowledge:  Fair  Language:  Fair  Akathisia:  Negative  Handed:  Right  AIMS (if indicated):     Assets:  Desire for Improvement Social Support  ADL's:  Impaired  Cognition:  WNL  Sleep:  Number of Hours: 4     Treatment Plan Summary: Daily contact with patient to assess and evaluate symptoms and progress in treatment, Medication management and Plan Patient is seen and examined.  Patient is a 67 year old female with the above-stated past psychiatric history seen in follow-up.  I am going to increase her fluvoxamine up to 100 mg p.o. nightly.  I am a leave everything else as is.  We will give her 12.5 mg hydrochlorothiazide this morning to take off a little bit more fluid.  I gave her the option of compression hose versus the fluid pill, and she is so indecisive secondary to her anxiety.  We will continue the one-to-one.  She is walking with a walker now a little bit better.  Her nutritional status is improving.  We have discussed ECT, and she continues to refuse that for now.  Antonieta Pert, MD 06/04/2017, 10:12 AM

## 2017-06-04 NOTE — Progress Notes (Signed)
Pt having a restless sleep and has since gotten up again to use the bathroom but did not stay in the bathroom like she did before   She is anxious and sad and has a negative attitude   She is very unsteady on her feet and is unpredictable Pt remains on a 1:1 for safety  Pt is presently safe

## 2017-06-04 NOTE — [facility] Group Notes (Signed)
[facility] Group Notes: (Clinical Social Work)   06/04/2017      Type of Therapy:  Group Therapy   Participation Level:  Did Not Attend despite MHT prompting   Amanda Whitacre Grossman-Orr, LCSW 06/04/2017, 12:20 PM     

## 2017-06-04 NOTE — Plan of Care (Signed)
Patient has no participation in group activities. 1:1 maintained. Patient too anxious to interact with peers. Suspicious of staff. Paranoid about medications. Minimal improvement with paranoia since admission. Less thought blocking and tremors have decreased since admission.

## 2017-06-05 DIAGNOSIS — R609 Edema, unspecified: Secondary | ICD-10-CM

## 2017-06-05 MED ORDER — FLUVOXAMINE MALEATE 50 MG PO TABS
150.0000 mg | ORAL_TABLET | Freq: Every day | ORAL | Status: DC
  Administered 2017-06-05 – 2017-06-09 (×5): 150 mg via ORAL
  Filled 2017-06-05 (×6): qty 3

## 2017-06-05 MED ORDER — QUETIAPINE FUMARATE 25 MG PO TABS
25.0000 mg | ORAL_TABLET | Freq: Every day | ORAL | Status: DC
  Administered 2017-06-05: 25 mg via ORAL
  Filled 2017-06-05 (×2): qty 1

## 2017-06-05 NOTE — Progress Notes (Signed)
1:1 Nursing note:  Patient sitting in dayroom with 1:1 sitter. Pt appears to be feeling better- less paranoid today. Pt remains safe with Q15 min checks and 1:1 sitter.

## 2017-06-05 NOTE — Progress Notes (Signed)
[facility] MD Progress Note  06/05/2017 9:14 AM Amanda Hall  MRN:  [id number] Subjective: Patient is seen and examined.  Patient is a 67 year old female with a past psychiatric history significant for anxiety, depression and suspected obsessive-compulsive disorder who presented originally on 5/17 for admission.  She is more engaging this morning.  She is actually able to smile.  She talked about the fact that she had to urinate and get up during the night and did not sleep well.  I told her we would stop the hydrochlorothiazide for the peripheral edema.  He denied any suicidal ideation.  She does appear to be doing better, and we talked about the possibility of discharge in the next 5 to 7 days.  She became very apprehensive about that.  Otherwise no complaints today. Principal Problem: <principal problem not specified> Diagnosis:   Patient Active Problem List   Diagnosis Date Noted  . Generalized anxiety disorder [F41.1] 05/26/2017  . Major depressive disorder, recurrent severe without psychotic features (HCC) [F33.2] 05/26/2017  . Hypercholesterolemia [E78.00] 03/22/2017  . Nonspecific (abnormal) findings on radiological and other examination of body structure [793] 12/26/2006  . NONSPECIFIC ABNORM FIND RAD&OTH EXAM LUNG FIELD [R93.0] 12/26/2006  . ANEMIA-IRON DEFICIENCY [D50.9] 12/15/2006  . Depression [F32.9] 12/15/2006  . Allergic rhinitis [J30.9] 12/15/2006   Total Time spent with patient: 20 minutes  Past Psychiatric History: See admission H&P  Past Medical History:  Past Medical History:  Diagnosis Date  . Allergy   . Anemia   . Anxiety   . Depression   . Hemorrhoids     Past Surgical History:  Procedure Laterality Date  . arm surgery     Left  . TUBAL LIGATION     Family History:  Family History  Problem Relation Age of Onset  . Stroke Mother   . Hypertension Sister   . Cancer Neg Hx        breat and colon hx  . Diabetes Neg Hx        family   Family Psychiatric   History: See admission H&P Social History:  Social History   Substance and Sexual Activity  Alcohol Use No     Social History   Substance and Sexual Activity  Drug Use No    Social History   Socioeconomic History  . Marital status: Divorced    Spouse name: Not on file  . Number of children: Not on file  . Years of education: Not on file  . Highest education level: Not on file  Occupational History  . Not on file  Social Needs  . Financial resource strain: Not on file  . Food insecurity:    Worry: Not on file    Inability: Not on file  . Transportation needs:    Medical: Not on file    Non-medical: Not on file  Tobacco Use  . Smoking status: Never Smoker  . Smokeless tobacco: Never Used  Substance and Sexual Activity  . Alcohol use: No  . Drug use: No  . Sexual activity: Not Currently  Lifestyle  . Physical activity:    Days per week: Not on file    Minutes per session: Not on file  . Stress: Not on file  Relationships  . Social connections:    Talks on phone: Not on file    Gets together: Not on file    Attends religious service: Not on file    Active member of club or organization: Not on file  Attends meetings of clubs or organizations: Not on file    Relationship status: Not on file  Other Topics Concern  . Not on file  Social History Narrative  . Not on file   Additional Social History:                         Sleep: Poor  Appetite:  Fair  Current Medications: Current Facility-Administered Medications  Medication Dose Route Frequency Provider Last Rate Last Dose  . acetaminophen (TYLENOL) tablet 650 mg  650 mg Oral Q6H PRN Charm Rings, NP   650 mg at 05/31/17 2002  . alum & mag hydroxide-simeth (MAALOX/MYLANTA) 200-200-20 MG/5ML suspension 30 mL  30 mL Oral Q4H PRN Charm Rings, NP   30 mL at 06/02/17 1733  . calcium-vitamin D (OSCAL WITH D) 500-200 MG-UNIT per tablet 1 tablet  1 tablet Oral Q breakfast Antonieta Pert, MD    1 tablet at 06/05/17 0805  . fluvoxaMINE (LUVOX) tablet 150 mg  150 mg Oral QHS Antonieta Pert, MD      . hydrocortisone (ANUSOL-HC) 2.5 % rectal cream   Rectal TID Antonieta Pert, MD      . hydrocortisone cream 1 %   Topical BID PRN Cobos, Rockey Situ, MD      . loratadine (CLARITIN) tablet 10 mg  10 mg Oral Daily Antonieta Pert, MD   10 mg at 06/05/17 0805  . LORazepam (ATIVAN) tablet 1 mg  1 mg Oral TID Antonieta Pert, MD   1 mg at 06/05/17 0806  . magic mouthwash w/lidocaine  10 mL Oral QID Antonieta Pert, MD   10 mL at 06/04/17 2152  . magnesium hydroxide (MILK OF MAGNESIA) suspension 30 mL  30 mL Oral Daily PRN Charm Rings, NP      . megestrol (MEGACE) tablet 40 mg  40 mg Oral Daily Antonieta Pert, MD   40 mg at 06/05/17 0805  . mirtazapine (REMERON) tablet 45 mg  45 mg Oral QHS Antonieta Pert, MD   45 mg at 06/04/17 2151  . OXcarbazepine (TRILEPTAL) tablet 225 mg  225 mg Oral QHS Antonieta Pert, MD   225 mg at 06/04/17 2151  . OXcarbazepine (TRILEPTAL) tablet 75 mg  75 mg Oral BID Cobos, Rockey Situ, MD   75 mg at 06/05/17 0805  . polyethylene glycol (MIRALAX / GLYCOLAX) packet 8 g  8 g Oral Daily Antonieta Pert, MD   8 g at 06/03/17 0454  . potassium chloride (KLOR-CON) packet 40 mEq  40 mEq Oral Once Charm Rings, NP        Lab Results: No results found for this or any previous visit (from the past 48 hour(s)).  Blood Alcohol level:  Lab Results  Component Value Date   ETH <10 05/25/2017   ETH <10 05/18/2017    Metabolic Disorder Labs: No results found for: HGBA1C, MPG No results found for: PROLACTIN Lab Results  Component Value Date   CHOL 189 03/22/2017   TRIG 218.0 (H) 03/22/2017   HDL 50.60 03/22/2017   CHOLHDL 4 03/22/2017   VLDL 43.6 (H) 03/22/2017   LDLCALC 226 (H) 07/13/2016    Physical Findings: AIMS: Facial and Oral Movements Muscles of Facial Expression: None, normal Lips and Perioral Area: Mild Jaw: None,  normal Tongue: None, normal,Extremity Movements Upper (arms, wrists, hands, fingers): Moderate Lower (legs, knees, ankles, toes): Mild, Trunk Movements Neck, shoulders,  hips: Moderate, Overall Severity Severity of abnormal movements (highest score from questions above): Moderate Incapacitation due to abnormal movements: Mild Patient's awareness of abnormal movements (rate only patient's report): Aware, mild distress, Dental Status Current problems with teeth and/or dentures?: No(no dentures in place) Does patient usually wear dentures?: Yes  CIWA:  CIWA-Ar Total: 15 COWS:  COWS Total Score: 12  Musculoskeletal: Strength & Muscle Tone: decreased Gait & Station: shuffle Patient leans: N/A  Psychiatric Specialty Exam: Physical Exam  Constitutional: She is oriented to person, place, and time.  HENT:  Head: Normocephalic and atraumatic.  Respiratory: Effort normal.  Neurological: She is oriented to person, place, and time.    ROS  Blood pressure 106/65, pulse (!) 109, temperature (!) 97.5 F (36.4 C), temperature source Oral, resp. rate 18, height  (1.702 m), weight 56.2 kg (124 lb), SpO2 100 %.Body mass index is 19.42 kg/m.  General Appearance: Casual  Eye Contact:  Fair  Speech:  Blocked  Volume:  Decreased  Mood:  Anxious  Affect:  Congruent  Thought Process:  Coherent  Orientation:  Full (Time, Place, and Person)  Thought Content:  Obsessions and Rumination  Suicidal Thoughts:  No  Homicidal Thoughts:  No  Memory:  Immediate;   Fair Recent;   Fair Remote;   Fair  Judgement:  Impaired  Insight:  Lacking  Psychomotor Activity:  Increased  Concentration:  Concentration: Fair and Attention Span: Fair  Recall:  [employer] of Knowledge:  Fair  Language:  Fair  Akathisia:  Negative  Handed:  Right  AIMS (if indicated):     Assets:  Desire for Improvement Housing Resilience Social Support  ADL's:  Impaired  Cognition:  WNL  Sleep:  Number of Hours: 1.25      Treatment Plan Summary: Daily contact with patient to assess and evaluate symptoms and progress in treatment, Medication management and Plan Patient is seen and examined.  Patient is a 67 year old female with the above-stated past psychiatric history seen in follow-up.  She is more engaging and talkative than she is been.  She did not sleep well.  I am going to increase the Luvox to 150 mg p.o. nightly.  No other changes with her psychiatric medicines.  I am going to stop the hydrochlorothiazide.  We will put some TED hose on for peripheral edema.  I will make sure that they take them off at bedtime because I am sure she will not sleep well with them.  We will continue physical therapy.  Her appetite is increasing to a degree.  Antonieta Pert, MD 06/05/2017, 9:14 AM

## 2017-06-05 NOTE — [facility] Group Notes (Signed)
  [facility] LCSW Group Therapy Note  Date/Time: 06/05/17, 1000  Type of Therapy/Topic:  Group Therapy:  Emotion Regulation  Participation Level:  None   Mood: unable to assess  Description of Group:    The purpose of this group is to assist patients in learning to regulate negative emotions and experience positive emotions. Patients will be guided to discuss ways in which they have been vulnerable to their negative emotions. These vulnerabilities will be juxtaposed with experiences of positive emotions or situations, and patients challenged to use positive emotions to combat negative ones. Special emphasis will be placed on coping with negative emotions in conflict situations, and patients will process healthy conflict resolution skills.  Therapeutic Goals: 1. Patient will identify two positive emotions or experiences to reflect on in order to balance out negative emotions:  2. Patient will label two or more emotions that they find the most difficult to experience:  3. Patient will be able to demonstrate positive conflict resolution skills through discussion or role plays:   Summary of Patient Progress:Pt came to group late and left early.  Pt sat in the back and only spoke when asked question by CSW.  Pt did identify anxiety as an emotion that his hard for her to experience.       Therapeutic Modalities:   Cognitive Behavioral Therapy Feelings Identification Dialectical Behavioral Therapy  Daleen Squibb, LCSW

## 2017-06-05 NOTE — Progress Notes (Signed)
1:1 Nursing note:  Patient sitting in dayroom with 1:1 sitter. Pt interacting appropriately with staff - compliant with meds. Pt remains safe with 15 min checks and 1:1 sitter.

## 2017-06-05 NOTE — Progress Notes (Signed)
Adult Psychoeducational Group Note  Date:  06/05/2017 Time:  8:59 PM  Group Topic/Focus:  Wrap-Up Group:   The focus of this group is to help patients review their daily goal of treatment and discuss progress on daily workbooks.  Participation Level:  Minimal  Participation Quality:  Appropriate  Affect:  Blunted  Cognitive:  Oriented  Insight: Limited  Engagement in Group:  Engaged  Modes of Intervention:  Socialization and Support  Additional Comments:  Patient attended and participated in group tonight. She reports having a so,so day. She attended group and went for meals. Today she also spoke with her Doctor.  Lita Mains [facility] 06/05/2017, 8:59 PM

## 2017-06-05 NOTE — Progress Notes (Signed)
Patient observed in group led by SW this am- after encouraging pt to attend- Pt left group early- reporting that her anxiety is made worse when she is in a group setting- especially when she is called upon to answer a question.

## 2017-06-05 NOTE — Plan of Care (Signed)
TED hose applied per MD's order.   Problem: Safety: Goal: Periods of time without injury will increase Outcome: Progressing   Problem: Activity: Goal: Interest or engagement in leisure activities will improve Outcome: Progressing

## 2017-06-05 NOTE — Progress Notes (Signed)
1:1 Nursing Note  Patient attending group led by SW in dayroom. 1:1 sitter sitting beside patient. Patient remains safe with 15 min checks and continuous 1:1 observation.

## 2017-06-05 NOTE — Progress Notes (Signed)
Ordered TED hose delivered to unit from [facility]. Pt sitting in dayroom with 1:1 sitter watching tv,. Patient requests to "wait a little bit" before putting on hose

## 2017-06-05 NOTE — Progress Notes (Signed)
D. Pt presents with an anxious affect and congruent behavior. Pt observed initially sitting on bed with 1:1 sitter at bedside. Pt continues to be somewhat paranoid about medications, but was cooperative.  Pt currently denies SI/HI and A/V hallucinations..  A. Labs and vitals monitored. Pt compliant with medications. Pt supported emotionally and encouraged to express concerns and ask questions.   R. Pt remains safe with 15 minute checks. Will continue POC.

## 2017-06-05 NOTE — [facility] Group Notes (Signed)
Adult Psychoeducational Group Note  Date:  06/05/2017 Time:  4:00 PM  Group Topic/Focus: Love Language Healthy Communication:   The focus of this group is to discuss communication, barriers to communication, as well as healthy ways to communicate with others.  Participation Level:  None  Participation Quality:  Inattentive  Affect:  Anxious  Cognitive:  Disorganized  Insight: Lacking and Limited  Engagement in Group:  Limited  Modes of Intervention:  Discussion, Education, Socialization and Support  Additional Comments:  Patient came to group late and did not share/contribute to group discussion.  Marchelle Folks A Mata Rowen 06/05/2017, 5:10 PM

## 2017-06-05 NOTE — Progress Notes (Signed)
Patient is up in the bathroom looking at herself in the mirror talking about her eyes being red from no sleep. Patient has had minimal sleep tonight getting up constantly to go to bathroom afraid she ha had an accident on herself but has not. Patient remains safe and 1:1 continues.

## 2017-06-05 NOTE — Progress Notes (Signed)
Representative from [facility] 'Materials' called back re: TED hose

## 2017-06-05 NOTE — Progress Notes (Signed)
D: Pt was in the dayroom upon initial approach.  She presents with anxious, depressed affect and mood.  Her speech is slow and soft.  Pt is tremulous at times.  She is using walker for mobility.  Her gait is still weak and unsteady.  Her goal is to "be a good person."  Pt has been visible in milieu with few peer interactions.  She attended evening group.  During medication pass, pt states "I'm worried about being drowsy in the morning" and pt was provided with emotional support and reassurance.  A: Introduced self to pt.  Actively listened to pt and provided support and encouragement.  TED hose removed at HS per order.  Medication administered per order.  1:1 staff remains with pt for safety.  R: Pt is safe on the unit.  She was hesitant to take HS medications, stating "I need to get some things done" but was cooperative with encouragement.  She verbally contracts for safety.  Will continue to monitor and assess.

## 2017-06-05 NOTE — Progress Notes (Signed)
Writer entered patients room and observed her standing in the floor with her walker. She reports that she needs to go to the restroom but is not walking towards it. She is fixated and keep repeating something's not right. After assisting her to the bathroom she reported that she needed to get something and the room was too hot. Temperature was adjusted in her room and writer encouraged her to lie back down and elevate her feet to help the swelling go down. She eventually got back in the bed to rest. 1:1 continues and patient is safe.

## 2017-06-06 MED ORDER — QUETIAPINE FUMARATE 50 MG PO TABS
50.0000 mg | ORAL_TABLET | Freq: Every day | ORAL | Status: DC
  Administered 2017-06-06 – 2017-06-09 (×4): 50 mg via ORAL
  Filled 2017-06-06 (×5): qty 1

## 2017-06-06 MED ORDER — OXCARBAZEPINE 150 MG PO TABS
150.0000 mg | ORAL_TABLET | Freq: Every day | ORAL | Status: DC
  Administered 2017-06-06 – 2017-06-09 (×4): 150 mg via ORAL
  Filled 2017-06-06 (×5): qty 1

## 2017-06-06 NOTE — Progress Notes (Signed)
Pt is resting in bed with eyes closed.  Respirations are even and unlabored.  No distress noted.    1:1 staff remains with pt for safety.  Pt is safe on the unit.  Will continue to monitor and assess.  

## 2017-06-06 NOTE — Progress Notes (Signed)
[facility] Post 1:1 Observation Documentation  For the first (8) hours following discontinuation of 1:1 precautions, a progress note entry by nursing staff should be documented at least every 2 hours, reflecting the patient's behavior, condition, mood, and conversation.  Use the progress notes for additional entries.  Time 1:1 discontinued:  0807 Patient's Behavior:  Anxious ,but improving. Pt is sitting in group with peers. Took medications with firm encouragement.   Patient's Condition:  Improving  Patient's Conversation: " Do I have to go to groups and is the Dr making me go to a Nursing Home"?  Amanda Hall 06/06/2017, 10:52 AM

## 2017-06-06 NOTE — Progress Notes (Signed)
[facility] Post 1:1 Observation Documentation  For the first (8) hours following discontinuation of 1:1 precautions, a progress note entry by nursing staff should be documented at least every 2 hours, reflecting the patient's behavior, condition, mood, and conversation.  Use the progress notes for additional entries.  Time 1:1 discontinued:  0807  Patient's Behavior:  Anxious, requiring encouragement  Patient's Condition:  Improving, pacing in the room  Patient's Conversation:  " I think I'm having my period and they want to put me in a nursing home "  Jimmey Ralph 06/06/2017, 10:47 AM

## 2017-06-06 NOTE — Progress Notes (Signed)
[facility] MD Progress Note  06/06/2017 10:59 AM Amanda Hall  MRN:  [id number] Subjective: Patient is seen and examined.  Patient is a 67 year old female with a past psychiatric history significant for anxiety, depression and suspected obsessive-compulsive disorder.  She is seen in follow-up today.  Yesterday she was up and around, and able to ambulate with the walker.  We had discussed stopping the one-to-one yesterday, but she preferred to continue.  We discussed it again today, and she does not want it stopped, but I think it is necessary.  I told the patient I think she needs to demonstrate her ability to care for herself on her activities of daily living.  If she is unable to do that she may not be able to return home, and may need to go to either an assisted living facility or some kind of skilled nursing facility.  Her one-to-one was discharged today.  Her appetite remains stable, and she remains somewhat anxious.  She is able to speak more clearly and less obsessive than she had been.  The nurses reported that she slept 6.75 hours last night so that is clearly improved.  Her weight today is 55.339 kg and that improved as well.  She denied suicidal ideation. Principal Problem: <principal problem not specified> Diagnosis:   Patient Active Problem List   Diagnosis Date Noted  . Generalized anxiety disorder [F41.1] 05/26/2017  . Major depressive disorder, recurrent severe without psychotic features (HCC) [F33.2] 05/26/2017  . Hypercholesterolemia [E78.00] 03/22/2017  . Nonspecific (abnormal) findings on radiological and other examination of body structure [793] 12/26/2006  . NONSPECIFIC ABNORM FIND RAD&OTH EXAM LUNG FIELD [R93.0] 12/26/2006  . ANEMIA-IRON DEFICIENCY [D50.9] 12/15/2006  . Depression [F32.9] 12/15/2006  . Allergic rhinitis [J30.9] 12/15/2006   Total Time spent with patient: 20 minutes  Past Psychiatric History: See admission H&P  Past Medical History:  Past Medical History:   Diagnosis Date  . Allergy   . Anemia   . Anxiety   . Depression   . Hemorrhoids     Past Surgical History:  Procedure Laterality Date  . arm surgery     Left  . TUBAL LIGATION     Family History:  Family History  Problem Relation Age of Onset  . Stroke Mother   . Hypertension Sister   . Cancer Neg Hx        breat and colon hx  . Diabetes Neg Hx        family   Family Psychiatric  History: See admission H&P Social History:  Social History   Substance and Sexual Activity  Alcohol Use No     Social History   Substance and Sexual Activity  Drug Use No    Social History   Socioeconomic History  . Marital status: Divorced    Spouse name: Not on file  . Number of children: Not on file  . Years of education: Not on file  . Highest education level: Not on file  Occupational History  . Not on file  Social Needs  . Financial resource strain: Not on file  . Food insecurity:    Worry: Not on file    Inability: Not on file  . Transportation needs:    Medical: Not on file    Non-medical: Not on file  Tobacco Use  . Smoking status: Never Smoker  . Smokeless tobacco: Never Used  Substance and Sexual Activity  . Alcohol use: No  . Drug use: No  . Sexual activity: Not  Currently  Lifestyle  . Physical activity:    Days per week: Not on file    Minutes per session: Not on file  . Stress: Not on file  Relationships  . Social connections:    Talks on phone: Not on file    Gets together: Not on file    Attends religious service: Not on file    Active member of club or organization: Not on file    Attends meetings of clubs or organizations: Not on file    Relationship status: Not on file  Other Topics Concern  . Not on file  Social History Narrative  . Not on file   Additional Social History:                         Sleep: Good  Appetite:  Fair  Current Medications: Current Facility-Administered Medications  Medication Dose Route Frequency  Provider Last Rate Last Dose  . acetaminophen (TYLENOL) tablet 650 mg  650 mg Oral Q6H PRN Charm Rings, NP   650 mg at 05/31/17 2002  . alum & mag hydroxide-simeth (MAALOX/MYLANTA) 200-200-20 MG/5ML suspension 30 mL  30 mL Oral Q4H PRN Charm Rings, NP   30 mL at 06/02/17 1733  . calcium-vitamin D (OSCAL WITH D) 500-200 MG-UNIT per tablet 1 tablet  1 tablet Oral Q breakfast Antonieta Pert, MD   1 tablet at 06/06/17 [phone]  . fluvoxaMINE (LUVOX) tablet 150 mg  150 mg Oral QHS Antonieta Pert, MD   150 mg at 06/05/17 2115  . hydrocortisone (ANUSOL-HC) 2.5 % rectal cream   Rectal TID Antonieta Pert, MD      . hydrocortisone cream 1 %   Topical BID PRN Cobos, Rockey Situ, MD      . loratadine (CLARITIN) tablet 10 mg  10 mg Oral Daily Antonieta Pert, MD   10 mg at 06/06/17 0840  . LORazepam (ATIVAN) tablet 1 mg  1 mg Oral TID Antonieta Pert, MD   1 mg at 06/06/17 0840  . magic mouthwash w/lidocaine  10 mL Oral QID Antonieta Pert, MD   10 mL at 06/06/17 [phone]  . magnesium hydroxide (MILK OF MAGNESIA) suspension 30 mL  30 mL Oral Daily PRN Charm Rings, NP      . megestrol (MEGACE) tablet 40 mg  40 mg Oral Daily Antonieta Pert, MD   40 mg at 06/06/17 0840  . mirtazapine (REMERON) tablet 45 mg  45 mg Oral QHS Antonieta Pert, MD   45 mg at 06/05/17 2116  . OXcarbazepine (TRILEPTAL) tablet 225 mg  225 mg Oral QHS Antonieta Pert, MD   225 mg at 06/05/17 2116  . OXcarbazepine (TRILEPTAL) tablet 75 mg  75 mg Oral BID Cobos, Rockey Situ, MD   75 mg at 06/06/17 0841  . polyethylene glycol (MIRALAX / GLYCOLAX) packet 8 g  8 g Oral Daily Antonieta Pert, MD   8 g at 06/03/17 5409  . potassium chloride (KLOR-CON) packet 40 mEq  40 mEq Oral Once Charm Rings, NP      . QUEtiapine (SEROQUEL) tablet 50 mg  50 mg Oral QHS Antonieta Pert, MD        Lab Results: No results found for this or any previous visit (from the past 48 hour(s)).  Blood Alcohol level:  Lab  Results  Component Value Date   ETH <10 05/25/2017   ETH <10  05/18/2017    Metabolic Disorder Labs: No results found for: HGBA1C, MPG No results found for: PROLACTIN Lab Results  Component Value Date   CHOL 189 03/22/2017   TRIG 218.0 (H) 03/22/2017   HDL 50.60 03/22/2017   CHOLHDL 4 03/22/2017   VLDL 43.6 (H) 03/22/2017   LDLCALC 226 (H) 07/13/2016    Physical Findings: AIMS: Facial and Oral Movements Muscles of Facial Expression: None, normal Lips and Perioral Area: Mild Jaw: None, normal Tongue: None, normal,Extremity Movements Upper (arms, wrists, hands, fingers): Moderate Lower (legs, knees, ankles, toes): Mild, Trunk Movements Neck, shoulders, hips: Moderate, Overall Severity Severity of abnormal movements (highest score from questions above): Moderate Incapacitation due to abnormal movements: Mild Patient's awareness of abnormal movements (rate only patient's report): Aware, mild distress, Dental Status Current problems with teeth and/or dentures?: No(no dentures in place) Does patient usually wear dentures?: Yes  CIWA:  CIWA-Ar Total: 15 COWS:  COWS Total Score: 12  Musculoskeletal: Strength & Muscle Tone: decreased Gait & Station: unsteady Patient leans: N/A  Psychiatric Specialty Exam: Physical Exam  Nursing note and vitals reviewed. Constitutional: She appears well-developed and well-nourished.  HENT:  Head: Normocephalic and atraumatic.  Respiratory: Effort normal.  Musculoskeletal: Normal range of motion.    ROS  Blood pressure 123/62, pulse (!) 117, temperature 98.1 F (36.7 C), temperature source Oral, resp. rate 18, height  (1.702 m), weight 55.3 kg (122 lb), SpO2 100 %.Body mass index is 19.11 kg/m.  General Appearance: Casual  Eye Contact:  Fair  Speech:  Blocked  Volume:  Decreased  Mood:  Anxious  Affect:  Congruent  Thought Process:  Coherent  Orientation:  Full (Time, Place, and Person)  Thought Content:  Logical and Rumination   Suicidal Thoughts:  No  Homicidal Thoughts:  No  Memory:  Immediate;   Fair Recent;   Fair Remote;   Fair  Judgement:  Intact  Insight:  Lacking  Psychomotor Activity:  Increased  Concentration:  Concentration: Fair and Attention Span: Fair  Recall:  [employer] of Knowledge:  Fair  Language:  Fair  Akathisia:  Negative  Handed:  Right  AIMS (if indicated):     Assets:  Desire for Improvement Financial Resources/Insurance Housing Resilience Social Support  ADL's:  Impaired  Cognition:  WNL  Sleep:  Number of Hours: 6.75     Treatment Plan Summary: Daily contact with patient to assess and evaluate symptoms and progress in treatment, Medication management and Plan Patient is seen and examined.  Patient is a 67 year old female with the above-stated past psychiatric history seen in follow-up.  We will continue the fluvoxamine at 150 mg p.o. nightly as well as 45 mg p.o. nightly of Remeron.  There will be no change in her Trileptal dosage.  I am going to increase her Seroquel to 50 mg p.o. nightly.  I have stopped the one-to-one today, and hopefully she will be able to progress on her own.  I told her we need to find out whether or not she is able to do her activities of daily living.  We will see how she does over the next day or so, and hopefully be able to arrange discharge to home or to an appropriate facility if necessary.  Antonieta Pert, MD 06/06/2017, 10:59 AM

## 2017-06-06 NOTE — Progress Notes (Signed)
Pt did not attend group. Pt stayed in her room.

## 2017-06-06 NOTE — Progress Notes (Signed)
[facility] Post 1:1 Observation Documentation  For the first (8) hours following discontinuation of 1:1 precautions, a progress note entry by nursing staff should be documented at least every 2 hours, reflecting the patient's behavior, condition, mood, and conversation.  Use the progress notes for additional entries.  Time 1:1 discontinued:  0807  Patient's Behavior:  Anxious , eating in the cafeteria with encouragement  Patient's Condition:  Remains needy, participating more with encouragement  from staff.  Patient's Conversation:  " Do you think I'm going home tomorrow.?"  Dennison Nancy M 06/06/2017, 2:27 PM

## 2017-06-06 NOTE — Progress Notes (Signed)
Pt is sitting up in bed at this time.  She denies needs and concerns.  1:1 staff remains with pt for safety.  Pt is safe on the unit.  Will continue to monitor and assess.

## 2017-06-06 NOTE — Progress Notes (Signed)
[facility] Post 1:1 Observation Documentation  For the first (8) hours following discontinuation of 1:1 precautions, a progress note entry by nursing staff should be documented at least every 2 hours, reflecting the patient's behavior, condition, mood, and conversation.  Use the progress notes for additional entries.  Time 1:1 discontinued:  0807  Patient's Behavior:  Anxious , crying on the phone to family  Patient's Condition:  Pt requires much encouragement and reassurance from staff  Patient's Conversation:  Pt is asking family if she's really going home tomorrow  Jimmey Ralph 06/06/2017, 2:33 PM

## 2017-06-07 DIAGNOSIS — R251 Tremor, unspecified: Secondary | ICD-10-CM

## 2017-06-07 DIAGNOSIS — Z742 Need for assistance at home and no other household member able to render care: Secondary | ICD-10-CM

## 2017-06-07 DIAGNOSIS — R634 Abnormal weight loss: Secondary | ICD-10-CM

## 2017-06-07 DIAGNOSIS — Z741 Need for assistance with personal care: Secondary | ICD-10-CM

## 2017-06-07 LAB — URINALYSIS, COMPLETE (UACMP) WITH MICROSCOPIC
Bilirubin Urine: NEGATIVE
GLUCOSE, UA: NEGATIVE mg/dL
Ketones, ur: 5 mg/dL — AB
LEUKOCYTES UA: NEGATIVE
Nitrite: NEGATIVE
Protein, ur: 30 mg/dL — AB
Specific Gravity, Urine: 1.024 (ref 1.005–1.030)
pH: 5 (ref 5.0–8.0)

## 2017-06-07 NOTE — Progress Notes (Signed)
Recreation Therapy Notes  Animal-Assisted Activity (AAA) Program Checklist/Progress Notes Patient Eligibility Criteria Checklist & Daily Group note for Rec Tx Intervention  Date: 5.28.19 Time: 1430 Location: [address] Dayroom   AAA/T Program Assumption of Risk Form signed by Patient/ or Parent Legal Guardian YES   Patient is free of allergies or sever asthma YES  Patient reports no fear of animals YES   Patient reports no history of cruelty to animals YES   Patient understands his/her participation is voluntary  YES   Patient washes hands before animal contact YES   Patient washes hands after animal contact YES   Behavioral Response: Engaged  Education: Hand Washing, Appropriate Animal Interaction   Education Outcome: Acknowledges understanding/In group clarification offered/Needs additional education.   Clinical Observations/Feedback: Pt attended and participated in activity.    Tequisha Maahs, LRT/CTRS         Shanikka Wonders A 06/07/2017 3:36 PM 

## 2017-06-07 NOTE — [facility] Group Notes (Signed)
LCSW Group Therapy Note 06/07/2017 2:06 PM  Type of Therapy and Topic: Group Therapy: Overcoming Obstacles  Participation Level: Did Not Attend  Description of Group:  In this group patients will be encouraged to explore what they see as obstacles to their own wellness and recovery. They will be guided to discuss their thoughts, feelings, and behaviors related to these obstacles. The group will process together ways to cope with barriers, with attention given to specific choices patients can make. Each patient will be challenged to identify changes they are motivated to make in order to overcome their obstacles. This group will be process-oriented, with patients participating in exploration of their own experiences as well as giving and receiving support and challenge from other group members.  Therapeutic Goals: 1. Patient will identify personal and current obstacles as they relate to admission. 2. Patient will identify barriers that currently interfere with their wellness or overcoming obstacles.  3. Patient will identify feelings, thought process and behaviors related to these barriers. 4. Patient will identify two changes they are willing to make to overcome these obstacles:   Summary of Patient Progress  Invited, chose not to attend.    Therapeutic Modalities:  Cognitive Behavioral Therapy Solution Focused Therapy Motivational Interviewing Relapse Prevention Therapy   Alcario Drought Clinical Social Worker

## 2017-06-07 NOTE — Progress Notes (Signed)
Patient ID: Amanda Hall, female   DOB: 11-09-1950, 67 y.o.   MRN: [id number] PER STATE REGULATIONS 482.30  THIS CHART WAS REVIEWED FOR MEDICAL NECESSITY WITH RESPECT TO THE PATIENT'S ADMISSION/DURATION OF STAY.  NEXT REVIEW DATE:06/11/17  Loura Halt, RN, BSN CASE MANAGER

## 2017-06-07 NOTE — Progress Notes (Signed)
Pt has been up and "rambling" in her belongings all evening, very anxious and tremulous.  She has asked several times if someone is going to stay with her.  When writer tells her that she was taken off the 1:1, and she will be in her room tonight alone, she becomes even more anxious and starts ruminating about what will she do about the depends in her bag and how will she be able to do what she is supposed to do when things (activities) have to be done at a certain time?  Pt was told that she would be checked on every 15 minutes, but no one would be staying in the room with her.  Pt had to be directed and prompted throughout the evening.  When staff tried to get her to do something, she would start repeating "Oh, Lord, help me" over and over, without attempting to do what she was asked.  She would sit on the bed with her face in her hands, fretting about being told what she needed to do next to get ready for bed.  Hall staff, including RN, spent at least half the evening trying to get pt to take her medications and get ready for bed.  Staff had to repeatedly prompt her with each step of her toiletting, and wiping herself off to get ready for bed.  Staff helped her into the bed and took off her TED hose.  Pt was reminded not to get up without staff being present since she did not have on her TED hose.  Pt shook her head as if she understood.  Pt seems to need prompting with everything, even eating or drinking.  Support and encouragement offered.  Discharge plans are in process.  Safety maintained with q15 minute checks.

## 2017-06-07 NOTE — Progress Notes (Addendum)
Patient ID: Amanda Hall, female   DOB: [birth year], 67 y.o.   MRN: [id number]  Nursing Progress Note [id number]  Data: Patient presents with anxious mood and requires much encouragement/direction by staff to complete ADLs and participate in the milieu. Patient is resistant to care and continues to demonstrated negative/fixated thinking despite staff intervention. Patient needing signficant encouragement to attend meals and is not attending groups. Patient appears to be staff splitting and has reported staff has "been mean" when staff attempts to set limits and encourage patient to be independent. Patient complaint with scheduled medications but is hesitant to take them needing reassurance by nurses. Patient does complain of bleeding from her vagina but denies pain. Patient is noted to have spotting of frank red blood. Urine specimen collected per MD order. Patient provided but declined to complete their self-inventory sheet. Patient currently denies SI/HI/AVH.   Action: Patient educated about and provided medication per provider's orders. Patient safety maintained with q15 min safety checks and frequent rounding. High fall risk precautions in place. Emotional support given. 1:1 interaction and active listening provided. Patient encouraged to attend meals and groups. Patient encouraged to work on treatment plan and goals. Labs, vital signs and patient behavior monitored throughout shift. Patient is ambulating with a walker per provider order.  Response: Patient agrees to come to staff if any thoughts of SI/HI develop or if patient develops intention of acting on thoughts. Patient remains safe on the unit at this time. Patient is interacting with peers appropriately on the unit. Will continue to support and monitor.

## 2017-06-07 NOTE — Progress Notes (Signed)
Patient ID: Amanda Hall, female   DOB: Dec 25, 1950, 67 y.o.   MRN: [id number]  Urine specimen collected. To be given to lab per provider order.

## 2017-06-07 NOTE — Progress Notes (Signed)
Physical Therapy Treatment Patient Details Name: Amanda Hall MRN: [id number] DOB: 1950-05-30 Today's Date: 06/07/2017    History of Present Illness Pt admit with significant history of anxiety and depression. With great weight loss recetnly due to loss of appetite.     PT Comments    Pt with some improvement with activity tolerance during session today. Met pt up on her feet in her room going through her bags. Pt perseverating on her period, having enough pads to assist with that, taking mirlax and having to have a BM and being capable of getting herself clean. I was able to redirect her enough to participate with PT and work with gait with no RW today, however holding on for some guidance. Worked with higher level balance activities to simulate functional movement (see below).  Will continue to work with pt while she is here , and recommend HHPT when she returns home.    Follow Up Recommendations  Home health PT     Equipment Recommendations  Other (comment)(may benefit from 4 wheeled RW with seat for activity tolerance, balance and carrying items. )    Recommendations for Other Services       Precautions / Restrictions Precautions Precautions: Fall Restrictions Weight Bearing Restrictions: No    Mobility  Bed Mobility Overal bed mobility: Independent                Transfers Overall transfer level: Modified independent Equipment used: None             General transfer comment: pt initiated 2WW use this date, functional mobility stable with 2WW  Ambulation/Gait Ambulation/Gait assistance: Min assist Ambulation Distance (Feet): 200 Feet Assistive device: 1 person hand held assist Gait Pattern/deviations: Step-through pattern Gait velocity: slow   General Gait Details: Pt with imporved gait today, still slow and very cautious. Worked with more dynamic gait, side stepping and backward stepping with 1 hand assist and 2x LOB with minA with self recovery.     Stairs             Wheelchair Mobility    Modified Rankin (Stroke Patients Only)       Balance Overall balance assessment: Mild deficits observed, not formally tested                                          Cognition Arousal/Alertness: Awake/alert Behavior During Therapy: Anxious;Restless Overall Cognitive Status: Within Functional Limits for tasks assessed                                 General Comments: Pt with frequent ruminations and fixations, repeating questions asked to PT      Exercises Other Exercises Other Exercises: worked with dynamic gait today with side stepping, backward stepping (both HHA), marching in place, toe and heel raises, stop and start with 2x LOB with minA to recover    General Comments        Pertinent Vitals/Pain Pain Assessment: Faces Faces Pain Scale: Hurts a little bit(back a little becasue I have been up so much today) Pain Location:  back Pain Descriptors / Indicators: Discomfort;Aching Pain Intervention(s): Monitored during session;Repositioned    Home Living                      Prior Function  PT Goals (current goals can now be found in the care plan section) Acute Rehab PT Goals Patient Stated Goal: I want to go home PT Goal Formulation: With patient Time For Goal Achievement: 06/16/17 Potential to Achieve Goals: Good Progress towards PT goals: Progressing toward goals    Frequency           PT Plan Current plan remains appropriate    Co-evaluation              AM-PAC PT "6 Clicks" Daily Activity  Outcome Measure  Difficulty turning over in bed (including adjusting bedclothes, sheets and blankets)?: None Difficulty moving from lying on back to sitting on the side of the bed? : None   Help needed moving to and from a bed to chair (including a wheelchair)?: A Little Help needed walking in hospital room?: A Little Help needed climbing 3-5  steps with a railing? : A Little 6 Click Score: 17    End of Session   Activity Tolerance: Patient tolerated treatment well Patient left: (in group room sitting with dog therapy, but becoming tearful. Nursing assisted her back to her room. ) Nurse Communication: Mobility status PT Visit Diagnosis: Muscle weakness (generalized) (M62.81);Unsteadiness on feet (R26.81)     Time: 1400-1430 PT Time Calculation (min) (ACUTE ONLY): 30 min  Charges:  $Gait Training: 8-22 mins $Therapeutic Exercise: 8-22 mins                    G Codes:       Clide Dales, PT Pager: [phone] 06/07/2017    Gaye Scorza, Gatha Mayer 06/07/2017, 4:53 PM

## 2017-06-07 NOTE — Progress Notes (Signed)
Pt up since the morning announce was made.  Staff went into the room to put on pt's TED hose.  Pt was prompted to go to the bathroom since she has not urinated since last night, but took a few minutes to make her way to the bathroom.  She is insistent on putting several pads in her pull-up because "I'm having my period".  She does have some bright red spotting on the pads she wore last night.  After she finally urinated, she had bright red blood on the wipes.  Pt is focused on her depends and pad stating, "It's not going to work".  Pt was encouraged to the dayroom to get her vitals and weight.  Weight is 122 lbs this morning.  Pt is safe at this time.

## 2017-06-07 NOTE — Progress Notes (Addendum)
[facility] MD Progress Note  06/07/2017 11:54 AM Amanda Hall  MRN:  [id number] Subjective: patient reports ongoing anxiety/worry, but acknowledges that she is feeling partially better than at admission. Denies medication side effects at this time. Objective : I have discussed case with treatment team and have met with patient . 67 year old female, lives with sister, presented to hospital due to worsening depression , anxiety, poor self care and significant weight loss. At this time remains anxious, ruminative , but to lesser degree than on admission. She states her appetite remains fair, but that she has made an effort to " try to eat more". Denies suicidal ideations. Staff notes indicate she remains anxious, and requires significant encouragement and prompting for daily ADLs, toilet ting. Ambulation has improved partially since admission and has ambulated with a walker . Denies medication side effects at this time.  Milieu participation has been limited . Patient presents with bilateral distal tremors- states these are chronic and date back to young adulthood. She does state tremors have worsened overtime and are more noticeable when interacting with others, which she states is related to anxiety. Chart notes indicate patient reported vaginal(?) spotting /bleeding. Staff noted blood after urination, but unclear if hematuria.  Today does not report. Does not endorse dysuria or urgency. No fever.   Principal Problem: Depression, Anxiety  Diagnosis:   Patient Active Problem List   Diagnosis Date Noted  . Generalized anxiety disorder [F41.1] 05/26/2017  . Major depressive disorder, recurrent severe without psychotic features ([place]) [F33.2] 05/26/2017  . Hypercholesterolemia [E78.00] 03/22/2017  . Nonspecific (abnormal) findings on radiological and other examination of body structure [793] 12/26/2006  . NONSPECIFIC ABNORM FIND RAD&OTH EXAM LUNG FIELD [R93.0] 12/26/2006  . ANEMIA-IRON DEFICIENCY [D50.9]  12/15/2006  . Depression [F32.9] 12/15/2006  . Allergic rhinitis [J30.9] 12/15/2006   Total Time spent with patient: 20 minutes  Past Psychiatric History: See admission H&P  Past Medical History:  Past Medical History:  Diagnosis Date  . Allergy   . Anemia   . Anxiety   . Depression   . Hemorrhoids     Past Surgical History:  Procedure Laterality Date  . arm surgery     Left  . TUBAL LIGATION     Family History:  Family History  Problem Relation Age of Onset  . Stroke Mother   . Hypertension Sister   . Cancer Neg Hx        breat and colon hx  . Diabetes Neg Hx        family   Family Psychiatric  History: See admission H&P Social History:  Social History   Substance and Sexual Activity  Alcohol Use No     Social History   Substance and Sexual Activity  Drug Use No    Social History   Socioeconomic History  . Marital status: Divorced    Spouse name: Not on file  . Number of children: Not on file  . Years of education: Not on file  . Highest education level: Not on file  Occupational History  . Not on file  Social Needs  . Financial resource strain: Not on file  . Food insecurity:    Worry: Not on file    Inability: Not on file  . Transportation needs:    Medical: Not on file    Non-medical: Not on file  Tobacco Use  . Smoking status: Never Smoker  . Smokeless tobacco: Never Used  Substance and Sexual Activity  . Alcohol use:  No  . Drug use: No  . Sexual activity: Not Currently  Lifestyle  . Physical activity:    Days per week: Not on file    Minutes per session: Not on file  . Stress: Not on file  Relationships  . Social connections:    Talks on phone: Not on file    Gets together: Not on file    Attends religious service: Not on file    Active member of club or organization: Not on file    Attends meetings of clubs or organizations: Not on file    Relationship status: Not on file  Other Topics Concern  . Not on file  Social History  Narrative  . Not on file   Additional Social History:   Sleep: improving   Appetite:  Fair- but states eating better   Current Medications: Current Facility-Administered Medications  Medication Dose Route Frequency Provider Last Rate Last Dose  . acetaminophen (TYLENOL) tablet 650 mg  650 mg Oral Q6H PRN Patrecia Pour, NP   650 mg at 05/31/17 2002  . alum & mag hydroxide-simeth (MAALOX/MYLANTA) 200-200-20 MG/5ML suspension 30 mL  30 mL Oral Q4H PRN Patrecia Pour, NP   30 mL at 06/02/17 1733  . calcium-vitamin D (OSCAL WITH D) 500-200 MG-UNIT per tablet 1 tablet  1 tablet Oral Q breakfast Sharma Covert, MD   1 tablet at 06/07/17 [phone]  . fluvoxaMINE (LUVOX) tablet 150 mg  150 mg Oral QHS Sharma Covert, MD   150 mg at 06/06/17 2225  . hydrocortisone (ANUSOL-HC) 2.5 % rectal cream   Rectal TID Sharma Covert, MD      . hydrocortisone cream 1 %   Topical BID PRN Cobos, Myer Peer, MD      . loratadine (CLARITIN) tablet 10 mg  10 mg Oral Daily Sharma Covert, MD   10 mg at 06/07/17 0816  . LORazepam (ATIVAN) tablet 1 mg  1 mg Oral TID Sharma Covert, MD   1 mg at 06/07/17 0816  . magic mouthwash w/lidocaine  10 mL Oral QID Sharma Covert, MD   10 mL at 06/06/17 2225  . magnesium hydroxide (MILK OF MAGNESIA) suspension 30 mL  30 mL Oral Daily PRN Patrecia Pour, NP      . megestrol (MEGACE) tablet 40 mg  40 mg Oral Daily Sharma Covert, MD   40 mg at 06/07/17 0816  . mirtazapine (REMERON) tablet 45 mg  45 mg Oral QHS Sharma Covert, MD   45 mg at 06/06/17 2225  . OXcarbazepine (TRILEPTAL) tablet 150 mg  150 mg Oral QHS Sharma Covert, MD   150 mg at 06/06/17 2225  . OXcarbazepine (TRILEPTAL) tablet 75 mg  75 mg Oral BID Cobos, Myer Peer, MD   75 mg at 06/07/17 0816  . polyethylene glycol (MIRALAX / GLYCOLAX) packet 8 g  8 g Oral Daily Sharma Covert, MD   8 g at 06/03/17 6546  . potassium chloride (KLOR-CON) packet 40 mEq  40 mEq Oral Once Patrecia Pour, NP      . QUEtiapine (SEROQUEL) tablet 50 mg  50 mg Oral QHS Sharma Covert, MD   50 mg at 06/06/17 2225    Lab Results: No results found for this or any previous visit (from the past 64 hour(s)).  Blood Alcohol level:  Lab Results  Component Value Date   ETH <10 05/25/2017   ETH <10 05/18/2017  Metabolic Disorder Labs: No results found for: HGBA1C, MPG No results found for: PROLACTIN Lab Results  Component Value Date   CHOL 189 03/22/2017   TRIG 218.0 (H) 03/22/2017   HDL 50.60 03/22/2017   CHOLHDL 4 03/22/2017   VLDL 43.6 (H) 03/22/2017   LDLCALC 226 (H) 07/13/2016    Physical Findings: AIMS: Facial and Oral Movements Muscles of Facial Expression: None, normal Lips and Perioral Area: None, normal Jaw: None, normal Tongue: None, normal,Extremity Movements Upper (arms, wrists, hands, fingers): Moderate Lower (legs, knees, ankles, toes): Minimal, Trunk Movements Neck, shoulders, hips: Minimal, Overall Severity Severity of abnormal movements (highest score from questions above): Moderate Incapacitation due to abnormal movements: Minimal Patient's awareness of abnormal movements (rate only patient's report): Aware, mild distress, Dental Status Current problems with teeth and/or dentures?: Yes(no dentures in place) Does patient usually wear dentures?: Yes  CIWA:  CIWA-Ar Total: 15 COWS:  COWS Total Score: 12  Musculoskeletal: Strength & Muscle Tone: decreased Gait & Station: unsteady Patient leans: N/A  Psychiatric Specialty Exam: Physical Exam  Nursing note and vitals reviewed. Constitutional: She appears well-developed and well-nourished.  HENT:  Head: Normocephalic and atraumatic.  Respiratory: Effort normal.  Musculoskeletal: Normal range of motion.    ROS denies chest pain, no shortness of breath, no vomiting   Blood pressure 126/85, pulse (!) 116, temperature 98 F (36.7 C), temperature source Oral, resp. rate 20, height _0  (1.702 m),  weight 55.3 kg (122 lb), SpO2 100 %.Body mass index is 19.11 kg/m.  General Appearance: fairly groomed   Eye Contact:  Fair  Speech:  Normal Rate  Volume:  Decreased  Mood:  acknowledges mood has improved partially, remains anxious   Affect:  Congruent and - anxious   Thought Process:  Linear and Descriptions of Associations: Intact  Orientation:  Full (Time, Place, and Person)- she is fully alert and attentive, is oriented x 3   Thought Content:  no hallucinations, no delusions, not internally preoccupied   Suicidal Thoughts:  No- denies suicidal or self injurious ideations, denies homicidal or violent ideations  Homicidal Thoughts:  No  Memory:  recent and remote grossly intact   Judgement:  Fair  Insight:  Fair  Psychomotor Activity:  Normal- distal tremors   Concentration:  Concentration: Good and Attention Span: Good  Recall:  Good  Fund of Knowledge:  Good  Language:  Good  Akathisia:  Negative  Handed:  Right  AIMS (if indicated):     Assets:  Desire for Improvement Financial Resources/Insurance Housing Resilience Social Support  ADL's:  Impaired  Cognition:  WNL  Sleep:  Number of Hours: 6.25   Assessment - patient presents with partial improvement compared to admission, but remains significantly anxious , depressed, and does continue to need significant staff support for daily ADLs . She denies SI.  She is eating better, and has started ambulating with walker . Denies medication side effects.   Treatment Plan Summary: Encourage group and milieu participation to work on coping skills and symptom reduction Continue Luvox 150 mgrs QHS for depression, anxiety Continue Remeron 45 mgrs QHS for depression, anxiety, insomnia Continue Trileptal 75 mgrs BID and 150 mgrs QHS for mood disorder   Continue Ativan 1 mgr TID for anxiety Continue Megace 40 mgrs QDAY for anorexia/ appetite  Continue Seroquel 50 mgrs QHS for insomnia, mood disorder, anxiety Will recheck BMP and CBC  in AM. Will also check UA and U Culture  Jenne Campus, MD 06/07/2017, 11:54 AM  Patient ID: Amanda Hall, female   DOB: 1950/08/19, 67 y.o.   MRN: [id number]

## 2017-06-07 NOTE — Progress Notes (Signed)
Occupational Therapy Treatment Patient Details Name: Amanda Hall MRN: [id number] DOB: October 07, 1950 Today's Date: 06/07/2017    History of present illness Pt admit with significant history of anxiety and depression. With great weight loss recetnly due to loss of appetite.    OT comments  Met pt in room, standing and anxiously sorting through items on bed. Pt presents anxious and tearful this date, taking significant encouragement to practice ADLs with OT. Pt completed functional mobility with supervision and 2WW to tub room. Previous session, pt refused 2WW walker use, this date initiated and was stable with all functional mobility. Pt performed tub t/f with supervision assist and VC's (mostly encouragement). Pt performed sit <> stand from tub bench with supervision assist. Pt fixated on "having period" and with brief and pad both on for protection due to anxious feelings of not having enough protection. OT assured pt that one form of protection is more than enough, pt not in understanding and still fixating on this issue. Pt expressed concern of having BM and taking miralax (from physician report) stating she may not be able to clean herself up afterward, reassured pt that she has the ability to perform toilet hygiene, pt still uneasy about allowing BM. Pt splitting with staff and OT, saying others are mean and not compassionate (other staff have been setting time boundaries, etc. To decrease dependence). OT reassured pt that staff are encouraging independence, OT also consistently encouraging independence throughout. BLE edema decreased, no pitting or edema noted on tops of B feet. Ted hoes donned.   Pt will benefit from shower chair/tub bench in the home to improve independence in bathing.   Follow Up Recommendations  Home health OT(pending progress in [facility])    Equipment Recommendations  Tub/shower bench    Recommendations for Other Services      Precautions / Restrictions  Precautions Precautions: Fall Restrictions Weight Bearing Restrictions: No       Mobility Bed Mobility Overal bed mobility: Independent                Transfers Overall transfer level: Modified independent Equipment used: Rolling walker (2 wheeled)             General transfer comment: pt initiated 2WW use this date, functional mobility stable with 2WW    Balance Overall balance assessment: Mild deficits observed, not formally tested                                         ADL either performed or assessed with clinical judgement   ADL Overall ADL's : Needs assistance/impaired Eating/Feeding: Set up Eating/Feeding Details (indicate cue type and reason): pt needs encouragement from staff to leave room to attend meals Grooming: Supervision/safety Grooming Details (indicate cue type and reason): pt able to complete grooming taks with supervision and VC's to redirect from fixations and ruminations Upper Body Bathing: Sitting;Supervision/ safety Upper Body Bathing Details (indicate cue type and reason): Pt needing supervision and VC's to continue attending in activity Lower Body Bathing: Sitting/lateral leans;Supervison/ safety Lower Body Bathing Details (indicate cue type and reason): Pt needing supervision and VC's to continue attending in activity Upper Body Dressing : Set up   Lower Body Dressing: Set up Lower Body Dressing Details (indicate cue type and reason): increased time needed Toilet Transfer: Supervision/safety Toilet Transfer Details (indicate cue type and reason): Pt currently needing VC's and time boundaries, or pt will  remain toileting/irritate skin for 20+ minutes Toileting- Clothing Manipulation and Hygiene: Supervision/safety Toileting - Clothing Manipulation Details (indicate cue type and reason): pt currently using depend briefs and pad, frequently checked for "period" 5+ times in session  Tub/ Shower Transfer: Risk analyst Details (indicate cue type and reason): Pt demonstrated tub transfer with seat on unit with supervision Functional mobility during ADLs: Supervision/safety General ADL Comments: Pt limited mostly from ruminations and fixations     Vision       Perception     Praxis      Cognition Arousal/Alertness: Awake/alert Behavior During Therapy: Anxious;Restless Overall Cognitive Status: Within Functional Limits for tasks assessed                                 General Comments: Pt with frequent ruminations and fixations, repeating questions asked to OT        Exercises     Shoulder Instructions       General Comments      Pertinent Vitals/ Pain       Pain Assessment: Faces Faces Pain Scale: Hurts a little bit Pain Location: neck and back Pain Descriptors / Indicators: Discomfort;Aching Pain Intervention(s): Monitored during session;Repositioned  Home Living                                          Prior Functioning/Environment              Frequency  Min 3X/week        Progress Toward Goals  OT Goals(current goals can now be found in the care plan section)  Progress towards OT goals: Progressing toward goals  Acute Rehab OT Goals Patient Stated Goal: I want to go home OT Goal Formulation: With patient Time For Goal Achievement: 07/01/17 Potential to Achieve Goals: Good  Plan Discharge plan remains appropriate    Co-evaluation                 AM-PAC PT "6 Clicks" Daily Activity     Outcome Measure   Help from another person eating meals?: A Little Help from another person taking care of personal grooming?: A Little Help from another person toileting, which includes using toliet, bedpan, or urinal?: A Little Help from another person bathing (including washing, rinsing, drying)?: A Little Help from another person to put on and taking off regular upper body clothing?: A  Little Help from another person to put on and taking off regular lower body clothing?: A Little 6 Click Score: 18    End of Session    OT Visit Diagnosis: Muscle weakness (generalized) (M62.81);Unsteadiness on feet (R26.81)   Activity Tolerance Patient tolerated treatment well   Patient Left in bed   Nurse Communication Mobility status        Time: 4627-0350 OT Time Calculation (min): 42 min  Charges: OT General Charges $OT Visit: 1 Visit OT Treatments $Self Care/Home Management : 38-52 mins  Zenovia Jarred, MSOT, OTR/L   [place] 06/07/2017, 4:17 PM

## 2017-06-07 NOTE — [facility] Group Notes (Signed)
[facility] Group Notes:  (Nursing/MHT/Case Management/Adjunct)  Date:  06/07/2017  Time:  04:30 PM  Type of Therapy:  Relaxation/Mindfullness Group  Participation Level:  Active  Participation Quality:  Appropriate  Affect:  Anxious  Cognitive:  Alert and Oriented  Insight:  Improving  Engagement in Group:  Improving  Modes of Intervention:  Activity, Discussion and Education  Summary of Progress/Problems: Pt actively participated in the group activity tonight, did not participate in discussion.   Aurora Mask 06/07/2017, 5:48 PM

## 2017-06-07 NOTE — Progress Notes (Signed)
Adult Psychoeducational Group Note  Date:  06/07/2017 Time:  9:04 PM  Group Topic/Focus:  Wrap-Up Group:   The focus of this group is to help patients review their daily goal of treatment and discuss progress on daily workbooks.  Participation Level:  Did Not Attend  Participation Quality:  Did not attend  Affect:  Did not attend  Cognitive:  Did not attend  Insight: None  Engagement in Group:  Did not attend  Modes of Intervention:  Did not attend  Additional Comments:  Patient did not attend wrap up group tonight.   Nattalie Santiesteban L Beckey Polkowski 06/07/2017, 9:04 PM

## 2017-06-08 DIAGNOSIS — R8271 Bacteriuria: Secondary | ICD-10-CM

## 2017-06-08 NOTE — Progress Notes (Addendum)
Recreation Therapy Notes  Date: 5.29.19 Time: 0930 Location: [address] Dayroom  Group Topic: Stress Management  Goal Area(s) Addresses:  Patient will verbalize importance of using healthy stress management.  Patient will identify positive emotions associated with healthy stress management.   Intervention: Stress Management  Activity :  Meditation.  LRT played a meditation focusing on letting go of the past and living in the now.  Patients listened and followed along as the meditation played.  Education:  Stress Management, Discharge Planning.   Education Outcome: Acknowledges edcuation/In group clarification offered/Needs additional education  Clinical Observations/Feedback: Pt did not attend group.     Caroll Rancher, LRT/CTRS         Caroll Rancher A 06/08/2017 11:55 AM

## 2017-06-08 NOTE — Progress Notes (Signed)
Patient ID: Amanda Hall, female   DOB: 02-05-1950, 67 y.o.   MRN: [id number]  Nursing Progress Note [id number]  Data: Patient remains with fearful/anxious/paranoid thinking and obsessive thoughts. Patient provided but declined to complete their self-inventory sheet. Patient is isolative to her room and does not participate in the milieu when encouraged by staff. Patient currently denies/reports SI/HI/AVH.   Action: Patient educated about and provided medication per provider's orders. Patient safety maintained with q15 min safety checks and frequent rounding. High fall risk precautions in place. Emotional support given. 1:1 interaction and active listening provided. Patient encouraged to attend meals and groups. Patient encouraged to work on treatment plan and goals. Labs, vital signs and patient behavior monitored throughout shift.   Response: Patient agrees to come to staff if any thoughts of SI/HI develop or if patient develops intention of acting on thoughts. Patient remains safe on the unit at this time. Patient is interacting with peers appropriately on the unit. Will continue to support and monitor.

## 2017-06-08 NOTE — [facility] Group Notes (Signed)
[facility] [employer] Group Therapy 06/08/2017 1:15pm  Type of Therapy: Mental [employer] Presentation  Participation Level: Invited. Chose to remain in bed.   [employer], LCSW 06/08/2017 11:28 AM

## 2017-06-08 NOTE — Progress Notes (Signed)
[facility] MD Progress Note  06/08/2017 12:11 PM Shallen Luedke  MRN:  [id number] Subjective: Patient acknowledges partial improvement, she seems more future oriented and is focusing on being discharged home soon.  Today does not endorse medication side effects. Objective : I have discussed case with treatment team and have met with patient . 67 year old female, lives with sister, presented to hospital due to worsening depression , anxiety, poor self care and significant weight loss. Today we had a family meeting with patient, her sister with whom she lives, Education officer, museum, Probation officer.  We reviewed progress.  Sister confirms chronic and protracted history of anxiety.  She states that patient seems to be noticeably improved compared to admission and closer to her baseline.  Patient wants to return home, she is reluctant to consider a residential setting such as a nursing home.  Sister states that family is very supportive and states that they want patient to return home at discharge.  She describes a tight knit family, states that in addition to her ,a younger brother and an adult nephew help patient in daily activities to include driving to appointments and attending to daily activities.  Patient seems more animated today, smiled briefly and was interactive with her sister.  Spoke about her pet dogs and looking forward to seeing them soon. Ambulation has improved-currently ambulating with walker, gait presents steadier  Denies medication side effects at this time.  Limited group/milieu participation. UA-positive hemoglobin, protein, few bacteria, negative nitrites.  Patient does not describe dysuria, urgency and has no fever.  Urine culture pending.    Principal Problem: Depression, Anxiety  Diagnosis:   Patient Active Problem List   Diagnosis Date Noted  . Generalized anxiety disorder [F41.1] 05/26/2017  . Major depressive disorder, recurrent severe without psychotic features ([place]) [F33.2] 05/26/2017  .  Hypercholesterolemia [E78.00] 03/22/2017  . Nonspecific (abnormal) findings on radiological and other examination of body structure [793] 12/26/2006  . NONSPECIFIC ABNORM FIND RAD&OTH EXAM LUNG FIELD [R93.0] 12/26/2006  . ANEMIA-IRON DEFICIENCY [D50.9] 12/15/2006  . Depression [F32.9] 12/15/2006  . Allergic rhinitis [J30.9] 12/15/2006   Total Time spent with patient: 30 minutes  Past Psychiatric History: See admission H&P  Past Medical History:  Past Medical History:  Diagnosis Date  . Allergy   . Anemia   . Anxiety   . Depression   . Hemorrhoids     Past Surgical History:  Procedure Laterality Date  . arm surgery     Left  . TUBAL LIGATION     Family History:  Family History  Problem Relation Age of Onset  . Stroke Mother   . Hypertension Sister   . Cancer Neg Hx        breat and colon hx  . Diabetes Neg Hx        family   Family Psychiatric  History: See admission H&P Social History:  Social History   Substance and Sexual Activity  Alcohol Use No     Social History   Substance and Sexual Activity  Drug Use No    Social History   Socioeconomic History  . Marital status: Divorced    Spouse name: Not on file  . Number of children: Not on file  . Years of education: Not on file  . Highest education level: Not on file  Occupational History  . Not on file  Social Needs  . Financial resource strain: Not on file  . Food insecurity:    Worry: Not on file  Inability: Not on file  . Transportation needs:    Medical: Not on file    Non-medical: Not on file  Tobacco Use  . Smoking status: Never Smoker  . Smokeless tobacco: Never Used  Substance and Sexual Activity  . Alcohol use: No  . Drug use: No  . Sexual activity: Not Currently  Lifestyle  . Physical activity:    Days per week: Not on file    Minutes per session: Not on file  . Stress: Not on file  Relationships  . Social connections:    Talks on phone: Not on file    Gets together: Not on  file    Attends religious service: Not on file    Active member of club or organization: Not on file    Attends meetings of clubs or organizations: Not on file    Relationship status: Not on file  Other Topics Concern  . Not on file  Social History Narrative  . Not on file   Additional Social History:   Sleep: fair  Appetite: Partially improved  Current Medications: Current Facility-Administered Medications  Medication Dose Route Frequency Provider Last Rate Last Dose  . acetaminophen (TYLENOL) tablet 650 mg  650 mg Oral Q6H PRN Patrecia Pour, NP   650 mg at 06/08/17 1031  . alum & mag hydroxide-simeth (MAALOX/MYLANTA) 200-200-20 MG/5ML suspension 30 mL  30 mL Oral Q4H PRN Patrecia Pour, NP   30 mL at 06/02/17 1733  . calcium-vitamin D (OSCAL WITH D) 500-200 MG-UNIT per tablet 1 tablet  1 tablet Oral Q breakfast Sharma Covert, MD   1 tablet at 06/08/17 0940  . fluvoxaMINE (LUVOX) tablet 150 mg  150 mg Oral QHS Sharma Covert, MD   150 mg at 06/07/17 2127  . hydrocortisone (ANUSOL-HC) 2.5 % rectal cream   Rectal TID Sharma Covert, MD      . hydrocortisone cream 1 %   Topical BID PRN Cobos, Myer Peer, MD      . loratadine (CLARITIN) tablet 10 mg  10 mg Oral Daily Sharma Covert, MD   10 mg at 06/08/17 6222  . LORazepam (ATIVAN) tablet 1 mg  1 mg Oral TID Sharma Covert, MD   1 mg at 06/08/17 1100  . magic mouthwash w/lidocaine  10 mL Oral QID Sharma Covert, MD   10 mL at 06/07/17 2128  . magnesium hydroxide (MILK OF MAGNESIA) suspension 30 mL  30 mL Oral Daily PRN Patrecia Pour, NP      . megestrol (MEGACE) tablet 40 mg  40 mg Oral Daily Sharma Covert, MD   40 mg at 06/08/17 0940  . mirtazapine (REMERON) tablet 45 mg  45 mg Oral QHS Sharma Covert, MD   45 mg at 06/07/17 2128  . OXcarbazepine (TRILEPTAL) tablet 150 mg  150 mg Oral QHS Sharma Covert, MD   150 mg at 06/07/17 2128  . OXcarbazepine (TRILEPTAL) tablet 75 mg  75 mg Oral BID  Cobos, Myer Peer, MD   75 mg at 06/08/17 0940  . polyethylene glycol (MIRALAX / GLYCOLAX) packet 8 g  8 g Oral Daily Sharma Covert, MD   8 g at 06/03/17 9798  . potassium chloride (KLOR-CON) packet 40 mEq  40 mEq Oral Once Patrecia Pour, NP      . QUEtiapine (SEROQUEL) tablet 50 mg  50 mg Oral QHS Sharma Covert, MD   50 mg at 06/07/17 2128  Lab Results:  Results for orders placed or performed during the hospital encounter of 05/26/17 (from the past 48 hour(s))  Urinalysis, Complete w Microscopic     Status: Abnormal   Collection Time: 06/07/17  1:27 PM  Result Value Ref Range   Color, Urine YELLOW YELLOW   APPearance TURBID (A) CLEAR   Specific Gravity, Urine 1.024 1.005 - 1.030   pH 5.0 5.0 - 8.0   Glucose, UA NEGATIVE NEGATIVE mg/dL   Hgb urine dipstick LARGE (A) NEGATIVE   Bilirubin Urine NEGATIVE NEGATIVE   Ketones, ur 5 (A) NEGATIVE mg/dL   Protein, ur 30 (A) NEGATIVE mg/dL   Nitrite NEGATIVE NEGATIVE   Leukocytes, UA NEGATIVE NEGATIVE   RBC / HPF 21-50 0 - 5 RBC/hpf   Bacteria, UA FEW (A) NONE SEEN   Mucus PRESENT    Hyaline Casts, UA PRESENT    Amorphous Crystal PRESENT    Ca Oxalate Crys, UA PRESENT    Non Squamous Epithelial 0-5 (A) NONE SEEN    Comment: Performed at [facility], [place] [address] [place], [place] [address]    Blood Alcohol level:  Lab Results  Component Value Date   ETH <10 05/25/2017   ETH <10 42/87/6811    Metabolic Disorder Labs: No results found for: HGBA1C, MPG No results found for: PROLACTIN Lab Results  Component Value Date   CHOL 189 03/22/2017   TRIG 218.0 (H) 03/22/2017   HDL 50.60 03/22/2017   CHOLHDL 4 03/22/2017   VLDL 43.6 (H) 03/22/2017   LDLCALC 226 (H) 07/13/2016    Physical Findings: AIMS: Facial and Oral Movements Muscles of Facial Expression: None, normal Lips and Perioral Area: None, normal Jaw: None, normal Tongue: None, normal,Extremity Movements Upper (arms, wrists, hands,  fingers): Moderate Lower (legs, knees, ankles, toes): Minimal, Trunk Movements Neck, shoulders, hips: Minimal, Overall Severity Severity of abnormal movements (highest score from questions above): Moderate Incapacitation due to abnormal movements: Minimal Patient's awareness of abnormal movements (rate only patient's report): Aware, mild distress, Dental Status Current problems with teeth and/or dentures?: Yes Does patient usually wear dentures?: Yes  CIWA:  CIWA-Ar Total: 15 COWS:  COWS Total Score: 12  Musculoskeletal: Strength & Muscle Tone: decreased Gait & Station: unsteady Patient leans: N/A  Psychiatric Specialty Exam: Physical Exam  Nursing note and vitals reviewed. Constitutional: She appears well-developed and well-nourished.  HENT:  Head: Normocephalic and atraumatic.  Respiratory: Effort normal.  Musculoskeletal: Normal range of motion.    ROS denies chest pain, no shortness of breath, no vomiting  No dysuria or urgency reported  Blood pressure 123/67, pulse (!) 115, temperature 98 F (36.7 C), temperature source Oral, resp. rate 18, height _0  (1.702 m), weight 54.9 kg (121 lb), SpO2 100 %.Body mass index is 18.95 kg/m.  General Appearance: Improving grooming  Eye Contact:  Fair  Speech:  Normal Rate  Volume:  Improving  Mood:  Partially improved mood, less severely anxious  Affect:  Congruent  Thought Process:  Linear and Descriptions of Associations: Intact  Orientation:  Full (Time, Place, and Person)- she is fully alert and attentive, is oriented x 3   Thought Content:  no hallucinations, no delusions, not internally preoccupied   Suicidal Thoughts:  No- denies suicidal or self injurious ideations, denies homicidal or violent ideations  Homicidal Thoughts:  No  Memory:  recent and remote grossly intact   Judgement:  Fair- improving   Insight:  Fair  Psychomotor Activity:  Normal- distal tremors have decreased  but are less significant   Concentration:   Concentration: Good and Attention Span: Good  Recall:  Good  Fund of Knowledge:  Good  Language:  Good  Akathisia:  Negative  Handed:  Right  AIMS (if indicated):     Assets:  Desire for Improvement Financial Resources/Insurance Housing Resilience Social Support  ADL's:  Impaired  Cognition:  WNL  Sleep:  Number of Hours: 3.25   Assessment -patient has improved gradually/partially since admission.  Today we had family meeting with her sister (with whom patient lives)-sister corroborates significant improvement compared to admission and patient being closer to her baseline.  Family is very supportive and they want patient to return home at discharge.  Sister confirms that her and other members of family help to take care of patient and her daily activities.  Thus far tolerating medications well.  Patient had reported some "spotting" and UA is positive for hemoglobin, but no indication of active UTI at this time.  Urine culture pending.  Patient should follow-up with her outpatient PCP or gynecologist for further management and work-up as needed.   Treatment Plan Summary: Treatment plan reviewed as below today 5/29 Encourage group and milieu participation to work on coping skills and symptom reduction Continue Luvox 150 mgrs QHS for depression, anxiety Continue Remeron 45 mgrs QHS for depression, anxiety, insomnia Continue Trileptal 75 mgrs BID and 150 mgrs QHS for mood disorder   Continue Ativan 1 mgr TID for anxiety Continue Megace 40 mgrs QDAY for anorexia/ appetite  Continue Seroquel 50 mgrs QHS for insomnia, mood disorder, anxiety Treatment team working on disposition plan-as above current plan is for patient to return home at discharge.  Jenne Campus, MD 06/08/2017, 12:11 PM   Patient ID: Amanda Hall, female   DOB: April 16, 1950, 67 y.o.   MRN: [id number]

## 2017-06-08 NOTE — Progress Notes (Signed)
Adult Psychoeducational Group Note  Date:  06/08/2017 Time:  8:52 PM  Group Topic/Focus:  Wrap-Up Group:   The focus of this group is to help patients review their daily goal of treatment and discuss progress on daily workbooks.  Participation Level:  Active  Participation Quality:  Appropriate  Affect:  Appropriate  Cognitive:  Appropriate  Insight: Appropriate  Engagement in Group:  Limited  Modes of Intervention:  Discussion  Additional Comments:  Patient stated her day started out good, but got stressful. Patient's goal for today was "to do my best so that I can go home".  [name] L [name] 06/08/2017, 8:52 PM

## 2017-06-08 NOTE — Progress Notes (Signed)
Patient ID: Amanda Hall, female   DOB: 10/11/1950, 67 y.o.   MRN: [id number] Pt observed in room her room pacing anxiously. Pt at assessment complained of severe anxiety and restlessness; "I don't know what's happening to me, I can just stay still." Pt also complained of moderate depression. Pt continually denied SI/HI, pain and AVH. Pt does not look to be in any distress at this time. Medications offered as prescribed. Pt given the opportunity to ask questions and state concerns. Support, encouragement, and safe environment provided. Pt was med compliant. All patient's questions and concerns addressed. 15-minute safety checks continue. Safety checks continue. Pt did not attend wrap-up group.

## 2017-06-08 NOTE — Plan of Care (Signed)
Problem: Safety: Goal: Periods of time without injury will increase Intervention: Patient contracts for safety on the unit. High fall risk precautions in place. Safety monitored with q15 minute checks. Outcome: Patient remains safe on the unit at this time. 06/08/2017 11:41 AM - Progressing by Ferrel Logan, RN

## 2017-06-08 NOTE — Progress Notes (Addendum)
Patient ID: Keta [place], female   DOB: 16-Sep-1950, 67 y.o.   MRN: [id number]  On initial approach this morning, patient was in the bathroom with MHT who was offering to provide the patient assistance. Patient was refusing. When patient saw writer approach she became more tearful and anxious. Patient states to writer multiple times, "I can't eat or drink, I will shit everywhere and no one will help me". Patient assured staff will help assist with ADLs. Patient informed she must continue to eat and drink to stay healthy. Patient encouraged to do as much as she can per PT, OT, and provider recommendations. Patient began hyperventilating in the bathroom. Patient initially refused her medications. Patient needing significant coaxing, education, and reassurance to take medications. Patient was only provided Ativan and Claritin before patient again began to refuse stating, "I can't, I can't". Patient could not identify why she was refusing to take her medicines. Patient continues to state, "you all will yell at me. Don't let that black woman back in here". Patient continues to staff split and request staff not take care of her. Patient provided emotional support and therapeutic communication. Patient directed to rest in her room and deep breathe. Patient now currently in her room no longer hyperventilating. Provider notified. Will reattempt to provide the rest of her morning medications when she is more calm.

## 2017-06-08 NOTE — Progress Notes (Signed)
Patient ID: Tessy Pawelski, female   DOB: July 23, 1950, 67 y.o.   MRN: [id number]  Patient has responded well to ativan. Patient remains with tremors but her level of anxiety has decreased. Patient provided other morning medications but continues to refuse rectal cream, miralax and mouth wash. Provider aware. Patient initially refusing to eat breakfast stating, "then I will have to pee and shit". Patient provided education about staying hydrated and nutrition. With writer present, patient drank a cup of water and writer was able to encourage patient to eat 1/4 of her breakfast tray. Patient often stops eating to talk about her depend diapers which she states, "are so expensive, I don't know what I will do if I mess in them". Patient denies ever having any accidents and cannot identify why she believes she will. Patient states, "I started wearing them after my surgery 4 months ago". Patient appears to have thought blocking at times and cannot finish her sentences. Writer encouraged patient to continue to eat on her own. Patient physically able to feed herself independently. Patient refuses for writer to provide an ensure. Will continue to support and monitor.

## 2017-06-09 DIAGNOSIS — R600 Localized edema: Secondary | ICD-10-CM

## 2017-06-09 LAB — CBC WITH DIFFERENTIAL/PLATELET
Basophils Absolute: 0 10*3/uL (ref 0.0–0.1)
Basophils Relative: 1 %
Eosinophils Absolute: 0 10*3/uL (ref 0.0–0.7)
Eosinophils Relative: 1 %
HEMATOCRIT: 34.3 % — AB (ref 36.0–46.0)
Hemoglobin: 11.4 g/dL — ABNORMAL LOW (ref 12.0–15.0)
LYMPHS ABS: 1.8 10*3/uL (ref 0.7–4.0)
LYMPHS PCT: 31 %
MCH: 29.4 pg (ref 26.0–34.0)
MCHC: 33.2 g/dL (ref 30.0–36.0)
MCV: 88.4 fL (ref 78.0–100.0)
MONO ABS: 0.4 10*3/uL (ref 0.1–1.0)
Monocytes Relative: 6 %
NEUTROS ABS: 3.6 10*3/uL (ref 1.7–7.7)
Neutrophils Relative %: 61 %
Platelets: 312 10*3/uL (ref 150–400)
RBC: 3.88 MIL/uL (ref 3.87–5.11)
RDW: 13.7 % (ref 11.5–15.5)
WBC: 5.9 10*3/uL (ref 4.0–10.5)

## 2017-06-09 LAB — BASIC METABOLIC PANEL
ANION GAP: 9 (ref 5–15)
BUN: 36 mg/dL — AB (ref 6–20)
CHLORIDE: 108 mmol/L (ref 101–111)
CO2: 25 mmol/L (ref 22–32)
Calcium: 10.5 mg/dL — ABNORMAL HIGH (ref 8.9–10.3)
Creatinine, Ser: 1.23 mg/dL — ABNORMAL HIGH (ref 0.44–1.00)
GFR calc Af Amer: 52 mL/min — ABNORMAL LOW (ref >60)
GFR, EST NON AFRICAN AMERICAN: 45 mL/min — AB (ref >60)
GLUCOSE: 168 mg/dL — AB (ref 65–99)
POTASSIUM: 3.3 mmol/L — AB (ref 3.5–5.1)
Sodium: 142 mmol/L (ref 135–145)

## 2017-06-09 LAB — URINE CULTURE
Culture: <10000 — AB
Special Requests: NORMAL

## 2017-06-09 NOTE — Progress Notes (Signed)
Pt is unchanged when it comes to her frequently rambling in her belongings and ruminating about what she needs to do about getting ready for the next day.  She continues to fret about her depends and having an additional pad in the depends.  Writer told her if the pad was bothering her, then take it out and throw it away because she did not need it, but it does no good to tell her, because she continues to pull at her depends and mess with the pad.  She continues to be very anxious about her toiletting issues and is unsure when she has had a BM.  She is so afraid of taking the Miralax because it might make her have an accident.  When staff tries to guide her to help herself, she says staff is being mean to her.  Pt did not eat a snack this evening and only drank a small amount of liquids for fear of having to go to the bathroom.  Pt denies SI/HI/AVH.  Support and encouragement offered.  Discharge plans are in process.  Safety maintained with q15 minute checks.

## 2017-06-09 NOTE — Progress Notes (Signed)
Patient is extremely tremulous and anxious.  Her left hand is swollen and red; not sure where the source of the injury came from.  She continues to ruminate over her depends, going through them several times.  Her anxiety appears to be related to her discharge.  She states, "I need a miracle.  They want to put me in a nursing home.  I want to go home."  I reassured her that the plan was for her to be discharged home, however, her anxiety continues to escalate while talking about it. Patient continues to state, "I need to go to the bathroom."  She goes back to the depends and goes through them again.  Patient is difficult to redirect and she is unsteady on her feet.  She is an extreme high fall risk, as she does not ambulate with her walker at times.

## 2017-06-09 NOTE — Progress Notes (Signed)
Psychoeducational Group Note  Date:  06/09/2017 Time:  2143  Group Topic/Focus:  Wrap-Up Group:   The focus of this group is to help patients review their daily goal of treatment and discuss progress on daily workbooks.  Participation Level: Did Not Attend  Participation Quality:  Not Applicable  Affect:  Not Applicable  Cognitive:  Not Applicable  Insight:  Not Applicable  Engagement in Group: Not Applicable  Additional Comments:  The patient did not attend group this evening.   Hazle Coca S 06/09/2017, 9:43 PM

## 2017-06-09 NOTE — [facility] Group Notes (Signed)
LCSW Group Therapy Note 06/09/2017 2:41 PM  Type of Therapy/Topic: Group Therapy: Feelings about Diagnosis  Participation Level: Did Not Attend   Description of Group:  This group will allow patients to explore their thoughts and feelings about diagnoses they have received. Patients will be guided to explore their level of understanding and acceptance of these diagnoses. Facilitator will encourage patients to process their thoughts and feelings about the reactions of others to their diagnosis and will guide patients in identifying ways to discuss their diagnosis with significant others in their lives. This group will be process-oriented, with patients participating in exploration of their own experiences, giving and receiving support, and processing challenge from other group members.  Therapeutic Goals: 1. Patient will demonstrate understanding of diagnosis as evidenced by identifying two or more symptoms of the disorder 2. Patient will be able to express two feelings regarding the diagnosis 3. Patient will demonstrate their ability to communicate their needs through discussion and/or role play  Summary of Patient Progress:  Invited, chose not to attend.     Therapeutic Modalities:  Cognitive Behavioral Therapy Brief Therapy Feelings Identification    Trevone Prestwood Catalina Antigua Clinical Social Worker

## 2017-06-09 NOTE — Progress Notes (Signed)
[facility] MD Progress Note  06/09/2017 11:19 AM Amanda Hall  MRN:  [id number] Subjective:  States " I want to go home". Endorses persistent anxiety. Denies suicidal ideations. At this time does not endorse medication side effects.  Objective : I have discussed case with treatment team and have met with patient . 67 year old female, lives with sister, presented to hospital due to worsening depression , anxiety, poor self care and significant weight loss. Patient presents with significant anxiety, ruminates about " going to a Nursing Home" eventually, reiterating that her preference is to remain home. She was reassured that family members have clearly stated their preference is for her to return home, as well and that this is the current disposition plan. Patient's anxiety does subside partially with reassurance, support and guided deep breathing exercises .  Of note, sister came for family meeting yesterday, corroborated that patient has long history of severe anxiety, and reported that patient seemed much improved compared to admission. Sister also reiterated that family is very supportive of patient and want her to return home, will continue to assist her in daily activities .  Patient noted to have some swelling and edema on L hand ( dorsum) . States it is not painful and has no decrease in mobility or range of motion. She does not remember trauma to this location. Have reviewed with RN staff, will monitor.   Reports history of intermittent genital bleeding, describes as spotting, and states it has " been happening for a while, only sometimes ". She states she is unsure if urinary or vaginal, does not describe associated symptoms. U/A (+) for Hgb, UCulture negative. I have reviewed importance of following up with PCP and /or Gynecologist for monitoring and further work up as appropriate.    Principal Problem: Depression, Anxiety  Diagnosis:   Patient Active Problem List   Diagnosis Date Noted  .  Generalized anxiety disorder [F41.1] 05/26/2017  . Major depressive disorder, recurrent severe without psychotic features ([place]) [F33.2] 05/26/2017  . Hypercholesterolemia [E78.00] 03/22/2017  . Nonspecific (abnormal) findings on radiological and other examination of body structure [793] 12/26/2006  . NONSPECIFIC ABNORM FIND RAD&OTH EXAM LUNG FIELD [R93.0] 12/26/2006  . ANEMIA-IRON DEFICIENCY [D50.9] 12/15/2006  . Depression [F32.9] 12/15/2006  . Allergic rhinitis [J30.9] 12/15/2006   Total Time spent with patient: 20 minutes  Past Psychiatric History: See admission H&P  Past Medical History:  Past Medical History:  Diagnosis Date  . Allergy   . Anemia   . Anxiety   . Depression   . Hemorrhoids     Past Surgical History:  Procedure Laterality Date  . arm surgery     Left  . TUBAL LIGATION     Family History:  Family History  Problem Relation Age of Onset  . Stroke Mother   . Hypertension Sister   . Cancer Neg Hx        breat and colon hx  . Diabetes Neg Hx        family   Family Psychiatric  History: See admission H&P Social History:  Social History   Substance and Sexual Activity  Alcohol Use No     Social History   Substance and Sexual Activity  Drug Use No    Social History   Socioeconomic History  . Marital status: Divorced    Spouse name: Not on file  . Number of children: Not on file  . Years of education: Not on file  . Highest education level: Not on  file  Occupational History  . Not on file  Social Needs  . Financial resource strain: Not on file  . Food insecurity:    Worry: Not on file    Inability: Not on file  . Transportation needs:    Medical: Not on file    Non-medical: Not on file  Tobacco Use  . Smoking status: Never Smoker  . Smokeless tobacco: Never Used  Substance and Sexual Activity  . Alcohol use: No  . Drug use: No  . Sexual activity: Not Currently  Lifestyle  . Physical activity:    Days per week: Not on file     Minutes per session: Not on file  . Stress: Not on file  Relationships  . Social connections:    Talks on phone: Not on file    Gets together: Not on file    Attends religious service: Not on file    Active member of club or organization: Not on file    Attends meetings of clubs or organizations: Not on file    Relationship status: Not on file  Other Topics Concern  . Not on file  Social History Narrative  . Not on file   Additional Social History:   Sleep: improving   Appetite: Partially improved  Current Medications: Current Facility-Administered Medications  Medication Dose Route Frequency Provider Last Rate Last Dose  . acetaminophen (TYLENOL) tablet 650 mg  650 mg Oral Q6H PRN Patrecia Pour, NP   650 mg at 06/08/17 1031  . alum & mag hydroxide-simeth (MAALOX/MYLANTA) 200-200-20 MG/5ML suspension 30 mL  30 mL Oral Q4H PRN Patrecia Pour, NP   30 mL at 06/02/17 1733  . calcium-vitamin D (OSCAL WITH D) 500-200 MG-UNIT per tablet 1 tablet  1 tablet Oral Q breakfast Sharma Covert, MD   1 tablet at 06/09/17 0830  . fluvoxaMINE (LUVOX) tablet 150 mg  150 mg Oral QHS Sharma Covert, MD   150 mg at 06/08/17 2128  . hydrocortisone (ANUSOL-HC) 2.5 % rectal cream   Rectal TID Sharma Covert, MD      . hydrocortisone cream 1 %   Topical BID PRN Gannon Heinzman, Myer Peer, MD      . loratadine (CLARITIN) tablet 10 mg  10 mg Oral Daily Sharma Covert, MD   10 mg at 06/09/17 5621  . LORazepam (ATIVAN) tablet 1 mg  1 mg Oral TID Sharma Covert, MD   1 mg at 06/09/17 0830  . magic mouthwash w/lidocaine  10 mL Oral QID Sharma Covert, MD   10 mL at 06/08/17 2129  . magnesium hydroxide (MILK OF MAGNESIA) suspension 30 mL  30 mL Oral Daily PRN Patrecia Pour, NP      . megestrol (MEGACE) tablet 40 mg  40 mg Oral Daily Sharma Covert, MD   40 mg at 06/09/17 0830  . mirtazapine (REMERON) tablet 45 mg  45 mg Oral QHS Sharma Covert, MD   45 mg at 06/08/17 2128  .  OXcarbazepine (TRILEPTAL) tablet 150 mg  150 mg Oral QHS Sharma Covert, MD   150 mg at 06/08/17 2129  . OXcarbazepine (TRILEPTAL) tablet 75 mg  75 mg Oral BID Nicolaas Savo, Myer Peer, MD   75 mg at 06/09/17 0830  . polyethylene glycol (MIRALAX / GLYCOLAX) packet 8 g  8 g Oral Daily Sharma Covert, MD   8 g at 06/09/17 0830  . potassium chloride (KLOR-CON) packet 40 mEq  40  mEq Oral Once Patrecia Pour, NP      . QUEtiapine (SEROQUEL) tablet 50 mg  50 mg Oral QHS Sharma Covert, MD   50 mg at 06/08/17 2129    Lab Results:  Results for orders placed or performed during the hospital encounter of 05/26/17 (from the past 48 hour(s))  Urinalysis, Complete w Microscopic     Status: Abnormal   Collection Time: 06/07/17  1:27 PM  Result Value Ref Range   Color, Urine YELLOW YELLOW   APPearance TURBID (A) CLEAR   Specific Gravity, Urine 1.024 1.005 - 1.030   pH 5.0 5.0 - 8.0   Glucose, UA NEGATIVE NEGATIVE mg/dL   Hgb urine dipstick LARGE (A) NEGATIVE   Bilirubin Urine NEGATIVE NEGATIVE   Ketones, ur 5 (A) NEGATIVE mg/dL   Protein, ur 30 (A) NEGATIVE mg/dL   Nitrite NEGATIVE NEGATIVE   Leukocytes, UA NEGATIVE NEGATIVE   RBC / HPF 21-50 0 - 5 RBC/hpf   Bacteria, UA FEW (A) NONE SEEN   Mucus PRESENT    Hyaline Casts, UA PRESENT    Amorphous Crystal PRESENT    Ca Oxalate Crys, UA PRESENT    Non Squamous Epithelial 0-5 (A) NONE SEEN    Comment: Performed at [facility], [place] [address] [place], [place] [address]  Urine Culture     Status: Abnormal   Collection Time: 06/07/17  1:27 PM  Result Value Ref Range   Specimen Description      URINE, CLEAN CATCH Performed at [facility], [place] [address], [place], [place] [address]    Special Requests      Normal Performed at [facility], [place] [address], [place], [place] [address]    Culture (A)     <10,000 COLONIES/mL INSIGNIFICANT GROWTH Performed at [place] [address] [place],  [address]    Report Status 06/09/2017 FINAL     Blood Alcohol level:  Lab Results  Component Value Date   ETH <10 05/25/2017   ETH <10 44/92/0100    Metabolic Disorder Labs: No results found for: HGBA1C, MPG No results found for: PROLACTIN Lab Results  Component Value Date   CHOL 189 03/22/2017   TRIG 218.0 (H) 03/22/2017   HDL 50.60 03/22/2017   CHOLHDL 4 03/22/2017   VLDL 43.6 (H) 03/22/2017   LDLCALC 226 (H) 07/13/2016    Physical Findings: AIMS: Facial and Oral Movements Muscles of Facial Expression: None, normal Lips and Perioral Area: None, normal Jaw: None, normal Tongue: None, normal,Extremity Movements Upper (arms, wrists, hands, fingers): Moderate Lower (legs, knees, ankles, toes): Minimal, Trunk Movements Neck, shoulders, hips: Minimal, Overall Severity Severity of abnormal movements (highest score from questions above): Moderate Incapacitation due to abnormal movements: Minimal Patient's awareness of abnormal movements (rate only patient's report): Aware, mild distress, Dental Status Current problems with teeth and/or dentures?: Yes Does patient usually wear dentures?: Yes  CIWA:  CIWA-Ar Total: 15 COWS:  COWS Total Score: 12  Musculoskeletal: Strength & Muscle Tone: decreased Gait & Station: unsteady Patient leans: N/A  Psychiatric Specialty Exam: Physical Exam  Nursing note and vitals reviewed. Constitutional: She appears well-developed and well-nourished.  HENT:  Head: Normocephalic and atraumatic.  Respiratory: Effort normal.  Musculoskeletal: Normal range of motion.    ROS denies chest pain, no shortness of breath, no vomiting  No dysuria or urgency reported. No fever , no chills   Blood pressure 134/67, pulse 77, temperature 98.2 F (36.8  C), temperature source Oral, resp. rate 18, height 5' 7"  (1.702 m), weight 55.1 kg (121 lb 8 oz), SpO2 100 %.Body mass index is 19.03 kg/m.  General Appearance: Fairly Groomed   Eye Contact:  Fair  Speech:  Normal Rate  Volume:  Improving  Mood:  remains anxious , states mood is better   Affect:  Congruent- significantly anxious, improves partially with support   Thought Process:  Linear and Descriptions of Associations: Intact  Orientation:  Full (Time, Place, and Person)- she is fully alert and attentive, is oriented x 3   Thought Content:  no hallucinations, no delusions, not internally preoccupied   Suicidal Thoughts:  No- denies suicidal or self injurious ideations, denies homicidal or violent ideations  Homicidal Thoughts:  No  Memory:  recent and remote grossly intact   Judgement:  Fair- improving   Insight:  Fair  Psychomotor Activity:  gait has improved, walks with walker - chronic tremors -  Concentration:  Concentration: Good and Attention Span: Good  Recall:  Good  Fund of Knowledge:  Good  Language:  Good  Akathisia:  Negative  Handed:  Right  AIMS (if indicated):     Assets:  Desire for Improvement Financial Resources/Insurance Housing Resilience Social Support  ADL's:  Partial improvement   Cognition:  WNL  Sleep:  Number of Hours: 6.25   Assessment -patient remains anxious, ruminative, but mood and anxiety have tended to improve compared to admission and sister has corroborated that she is closer to baseline . As per family and patient, anxiety has been chronic and protracted . At this time tolerating medications well- no sedation or other side effects noted or reported. Currently focused on discharge soon- family has corroborated desire for patient to return home at discharge and that they will continue to provide daily support . Patient seemed more anxious this AM, reporting fear she was going to be sent to Nursing Home, anxiety subsided with reassurance .   Treatment Plan Summary: Treatment plan reviewed as below today 5/30 Encourage group and milieu participation to work on coping skills and symptom reduction Continue Luvox 150 mgrs  QHS for depression, anxiety Continue Remeron 45 mgrs QHS for depression, anxiety, insomnia Continue Trileptal 75 mgrs BID and 150 mgrs QHS for mood disorder   Continue Ativan 1 mgr TID for anxiety Continue Megace 40 mgrs QDAY for anorexia/ appetite  Continue Seroquel 50 mgrs QHS for insomnia, mood disorder, anxiety Will recheck BMP and CBC in AM.  Treatment team working on disposition plan-as above current plan is for patient to return home at discharge.  Jenne Campus, MD 06/09/2017, 11:19 AM   Patient ID: Amanda Hall, female   DOB: 01/06/1951, 67 y.o.   MRN: [id number]

## 2017-06-09 NOTE — Progress Notes (Signed)
D: Patient continues to be tremulous and anxious.  She ruminates over her depends stating "the pad are not big enough."  She reports some bleeding at times stating, "I think I may have a period."  Patient is observed in her room walking around and less tremulous.  When staff enters the room, patient becomes more anxious and the tremors are significant.  She has swelling and redness on her left hand of unknown origin.  She rates her depression as a 10; hopelessness and anxiety as a 9.  Patient's goal is to "get to go home."    A: Continue to monitor medication management and MD orders.  Safety checks completed every 15 minutes per protocol.  Offer support and encouragement as needed.  R: Patient remains isolative to her room.

## 2017-06-09 NOTE — Progress Notes (Addendum)
As noted in pt's Head to Toe assessment during the evening, the bruise that was found on top of pt's L hand last night is darker and swollen this morning.  Pt denies pain at this time, but her hand does feel as if it has some heat to it.  Writer gave pt an ice pack and told her to hold it to her hand to hopefully reduce the swelling.  Pt had to be encouraged to sit down long enough to allow the ice to be of benefit.  Pt is still focused on her depends and fretting over the pad that is in the depends.

## 2017-06-10 MED ORDER — FLUVOXAMINE MALEATE 50 MG PO TABS: 150.0000 mg | ORAL_TABLET | Freq: Every day | ORAL | 0 refills | Status: DC

## 2017-06-10 MED ORDER — QUETIAPINE FUMARATE 50 MG PO TABS: 50.0000 mg | ORAL_TABLET | Freq: Every day | ORAL | 0 refills | Status: DC

## 2017-06-10 MED ORDER — MIRTAZAPINE 45 MG PO TABS: 45.0000 mg | ORAL_TABLET | Freq: Every day | ORAL | 0 refills | Status: DC

## 2017-06-10 MED ORDER — MEGESTROL ACETATE 40 MG PO TABS: 40.0000 mg | ORAL_TABLET | Freq: Every day | ORAL | 0 refills | Status: DC

## 2017-06-10 MED ORDER — DIPHENHYDRAMINE HCL 50 MG PO CAPS
50.0000 mg | ORAL_CAPSULE | Freq: Once | ORAL | Status: AC
  Filled 2017-06-10: qty 1

## 2017-06-10 MED ORDER — OXCARBAZEPINE 150 MG PO TABS: 75.0000 mg | ORAL_TABLET | Freq: Two times a day (BID) | ORAL | 0 refills | Status: DC

## 2017-06-10 MED ORDER — ZIPRASIDONE MESYLATE 20 MG IM SOLR
20.0000 mg | Freq: Once | INTRAMUSCULAR | Status: AC
  Administered 2017-06-10: 20 mg via INTRAMUSCULAR
  Filled 2017-06-10 (×2): qty 20

## 2017-06-10 MED ORDER — OXCARBAZEPINE 150 MG PO TABS: 150.0000 mg | ORAL_TABLET | Freq: Every day | ORAL | 0 refills | Status: DC

## 2017-06-10 MED ORDER — LORAZEPAM 1 MG PO TABS: 2.0000 mg | ORAL_TABLET | Freq: Once | ORAL | Status: AC

## 2017-06-10 MED ORDER — DIPHENHYDRAMINE HCL 50 MG/ML IJ SOLN
50.0000 mg | Freq: Once | INTRAMUSCULAR | Status: AC
  Administered 2017-06-10: 50 mg via INTRAMUSCULAR
  Filled 2017-06-10 (×2): qty 1

## 2017-06-10 MED ORDER — LORAZEPAM 2 MG/ML IJ SOLN
2.0000 mg | Freq: Once | INTRAMUSCULAR | Status: AC
  Administered 2017-06-10: 2 mg via INTRAMUSCULAR
  Filled 2017-06-10: qty 1

## 2017-06-10 NOTE — Plan of Care (Signed)
  Problem: Education: Goal: Knowledge of [facility] Education information/materials will improve Outcome: Completed/Met Goal: Emotional status will improve Outcome: Completed/Met Goal: Mental status will improve Outcome: Completed/Met Goal: Verbalization of understanding the information provided will improve Outcome: Completed/Met   Problem: Activity: Goal: Interest or engagement in activities will improve Outcome: Completed/Met Goal: Sleeping patterns will improve Outcome: Completed/Met   Problem: Coping: Goal: Ability to verbalize frustrations and anger appropriately will improve Outcome: Completed/Met Goal: Ability to demonstrate self-control will improve Outcome: Completed/Met   Problem: Health Behavior/Discharge Planning: Goal: Identification of resources available to assist in meeting health care needs will improve Outcome: Completed/Met Goal: Compliance with treatment plan for underlying cause of condition will improve Outcome: Completed/Met   Problem: Physical Regulation: Goal: Ability to maintain clinical measurements within normal limits will improve Outcome: Completed/Met   Problem: Safety: Goal: Periods of time without injury will increase Outcome: Completed/Met   Problem: Education: Goal: Utilization of techniques to improve thought processes will improve Outcome: Completed/Met Goal: Knowledge of the prescribed therapeutic regimen will improve Outcome: Completed/Met   Problem: Activity: Goal: Interest or engagement in leisure activities will improve Outcome: Completed/Met Goal: Imbalance in normal sleep/wake cycle will improve Outcome: Completed/Met   Problem: Coping: Goal: Coping ability will improve Outcome: Completed/Met Goal: Will verbalize feelings Outcome: Completed/Met   Problem: Health Behavior/Discharge Planning: Goal: Ability to make decisions will improve Outcome: Completed/Met Goal: Compliance with therapeutic regimen will  improve Outcome: Completed/Met   Problem: Role Relationship: Goal: Will demonstrate positive changes in social behaviors and relationships Outcome: Completed/Met   Problem: Safety: Goal: Ability to disclose and discuss suicidal ideas will improve Outcome: Completed/Met Goal: Ability to identify and utilize support systems that promote safety will improve Outcome: Completed/Met   Problem: Self-Concept: Goal: Will verbalize positive feelings about self Outcome: Completed/Met Goal: Level of anxiety will decrease Outcome: Completed/Met   Problem: Education: Goal: Ability to state activities that reduce stress will improve Outcome: Completed/Met   Problem: Coping: Goal: Ability to identify and develop effective coping behavior will improve Outcome: Completed/Met   Problem: Self-Concept: Goal: Ability to identify factors that promote anxiety will improve Outcome: Completed/Met Goal: Level of anxiety will decrease Outcome: Completed/Met Goal: Ability to modify response to factors that promote anxiety will improve Outcome: Completed/Met   Problem: Spiritual Needs Goal: Ability to function at adequate level Outcome: Completed/Met

## 2017-06-10 NOTE — Progress Notes (Signed)
At 0435, Pt became despondent to staff. Pt attempted to get out of bed while confused, unsteady, tremulous, delirious, and agitated. Pt was refused to voice needs. Pt was offered bathroom, food, fluids, and medicines. Pt refused. Pt could not be redirected. Pt attempted multiple times to get out of bed. Provider was alerted at 0437. Provider initiated new orders for ativan 2, benadryl 50, and Geodon 20. At 0439, IM medications were administered with the upper arm/forearm escort technique. Pt was NOT restrained physically. Vitals remained WNL. Pt sustained no injuries. Pt redirectable at this time. Will continue to monitor.

## 2017-06-10 NOTE — Tx Team (Signed)
Interdisciplinary Treatment and Diagnostic Plan Update  06/10/2017 Time of Session: 0915 Amanda Hall MRN: [id number]  Principal Diagnosis: <principal problem not specified>  Secondary Diagnoses: Active Problems:   Major depressive disorder, recurrent severe without psychotic features (HCC)   Current Medications:  Current Facility-Administered Medications  Medication Dose Route Frequency Provider Last Rate Last Dose  . acetaminophen (TYLENOL) tablet 650 mg  650 mg Oral Q6H PRN Charm Rings, NP   650 mg at 06/08/17 1031  . alum & mag hydroxide-simeth (MAALOX/MYLANTA) 200-200-20 MG/5ML suspension 30 mL  30 mL Oral Q4H PRN Charm Rings, NP   30 mL at 06/02/17 1733  . calcium-vitamin D (OSCAL WITH D) 500-200 MG-UNIT per tablet 1 tablet  1 tablet Oral Q breakfast Antonieta Pert, MD   Stopped at 06/10/17 0840  . fluvoxaMINE (LUVOX) tablet 150 mg  150 mg Oral QHS Antonieta Pert, MD   150 mg at 06/09/17 2138  . hydrocortisone (ANUSOL-HC) 2.5 % rectal cream   Rectal TID Antonieta Pert, MD   Stopped at 06/10/17 0840  . hydrocortisone cream 1 %   Topical BID PRN Cobos, Rockey Situ, MD      . loratadine (CLARITIN) tablet 10 mg  10 mg Oral Daily Antonieta Pert, MD   Stopped at 06/10/17 [phone]  . LORazepam (ATIVAN) tablet 1 mg  1 mg Oral TID Antonieta Pert, MD   Stopped at 06/10/17 [phone]  . magic mouthwash w/lidocaine  10 mL Oral QID Antonieta Pert, MD   Stopped at 06/10/17 [phone]  . magnesium hydroxide (MILK OF MAGNESIA) suspension 30 mL  30 mL Oral Daily PRN Charm Rings, NP      . megestrol (MEGACE) tablet 40 mg  40 mg Oral Daily Antonieta Pert, MD   40 mg at 06/09/17 0830  . mirtazapine (REMERON) tablet 45 mg  45 mg Oral QHS Antonieta Pert, MD   45 mg at 06/09/17 2138  . OXcarbazepine (TRILEPTAL) tablet 150 mg  150 mg Oral QHS Antonieta Pert, MD   150 mg at 06/09/17 2140  . OXcarbazepine (TRILEPTAL) tablet 75 mg  75 mg Oral BID Cobos, Rockey Situ, MD   Stopped at  06/10/17 [phone]  . polyethylene glycol (MIRALAX / GLYCOLAX) packet 8 g  8 g Oral Daily Antonieta Pert, MD   Stopped at 06/10/17 [phone]  . potassium chloride (KLOR-CON) packet 40 mEq  40 mEq Oral Once Charm Rings, NP      . QUEtiapine (SEROQUEL) tablet 50 mg  50 mg Oral QHS Antonieta Pert, MD   50 mg at 06/09/17 2138   PTA Medications: Medications Prior to Admission  Medication Sig Dispense Refill Last Dose  . atorvastatin (LIPITOR) 20 MG tablet TAKE 1 TABLET BY MOUTH ONCE DAILY 90 tablet 1 Past Week at Unknown time  . FLUoxetine (PROZAC) 20 MG capsule Take 1 capsule (20 mg total) by mouth daily. 30 capsule 0 Past Week at Unknown time  . gabapentin (NEURONTIN) 100 MG capsule Take 1 capsule (100 mg total) by mouth 3 (three) times daily. 90 capsule 0 Past Week at Unknown time  . hydrocortisone cream 1 % Apply to affected area 2 times daily 15 g 0 Past Month at Unknown time  . LORazepam (ATIVAN) 1 MG tablet Take 1 tablet (1 mg total) by mouth 2 (two) times daily. 60 tablet 3 Past Week at Unknown time  . polyethylene glycol (MIRALAX) packet Take 17 g by mouth 2 (  two) times daily. 28 each 0 Past Month at Unknown time    Patient Stressors: Financial difficulties Health problems  Patient Strengths: General fund of knowledge Supportive family/friends  Treatment Modalities: Medication Management, Group therapy, Case management,  1 to 1 session with clinician, Psychoeducation, Recreational therapy.   Physician Treatment Plan for Primary Diagnosis: <principal problem not specified> Long Term Goal(s): Improvement in symptoms so as ready for discharge Improvement in symptoms so as ready for discharge   Short Term Goals: Ability to identify changes in lifestyle to reduce recurrence of condition will improve Ability to maintain clinical measurements within normal limits will improve Ability to identify changes in lifestyle to reduce recurrence of condition will improve Ability to verbalize  feelings will improve Ability to disclose and discuss suicidal ideas Ability to demonstrate self-control will improve Ability to identify and develop effective coping behaviors will improve Ability to maintain clinical measurements within normal limits will improve  Medication Management: Evaluate patient's response, side effects, and tolerance of medication regimen.  Therapeutic Interventions: 1 to 1 sessions, Unit Group sessions and Medication administration.  Evaluation of Outcomes: Progressing  Physician Treatment Plan for Secondary Diagnosis: Active Problems:   Major depressive disorder, recurrent severe without psychotic features (HCC)  Long Term Goal(s): Improvement in symptoms so as ready for discharge Improvement in symptoms so as ready for discharge   Short Term Goals: Ability to identify changes in lifestyle to reduce recurrence of condition will improve Ability to maintain clinical measurements within normal limits will improve Ability to identify changes in lifestyle to reduce recurrence of condition will improve Ability to verbalize feelings will improve Ability to disclose and discuss suicidal ideas Ability to demonstrate self-control will improve Ability to identify and develop effective coping behaviors will improve Ability to maintain clinical measurements within normal limits will improve     Medication Management: Evaluate patient's response, side effects, and tolerance of medication regimen.  Therapeutic Interventions: 1 to 1 sessions, Unit Group sessions and Medication administration.  Evaluation of Outcomes: Progressing   RN Treatment Plan for Primary Diagnosis: <principal problem not specified> Long Term Goal(s): Knowledge of disease and therapeutic regimen to maintain health will improve  Short Term Goals: Ability to verbalize frustration and anger appropriately will improve, Ability to participate in decision making will improve, Ability to verbalize  feelings will improve, Ability to identify and develop effective coping behaviors will improve and Compliance with prescribed medications will improve  Medication Management: RN will administer medications as ordered by provider, will assess and evaluate patient's response and provide education to patient for prescribed medication. RN will report any adverse and/or side effects to prescribing provider.  Therapeutic Interventions: 1 on 1 counseling sessions, Psychoeducation, Medication administration, Evaluate responses to treatment, Monitor vital signs and CBGs as ordered, Perform/monitor CIWA, COWS, AIMS and Fall Risk screenings as ordered, Perform wound care treatments as ordered.  Evaluation of Outcomes: Progressing   LCSW Treatment Plan for Primary Diagnosis: <principal problem not specified> Long Term Goal(s): Safe transition to appropriate next level of care at discharge, Engage patient in therapeutic group addressing interpersonal concerns.  Short Term Goals: Engage patient in aftercare planning with referrals and resources, Increase social support, Increase ability to appropriately verbalize feelings, Increase emotional regulation, Facilitate acceptance of mental health diagnosis and concerns, Facilitate patient progression through stages of change regarding substance use diagnoses and concerns, Identify triggers associated with mental health/substance abuse issues and Increase skills for wellness and recovery  Therapeutic Interventions: Assess for all discharge needs, 1 to 1  time with Child psychotherapistocial worker, Explore available resources and support systems, Assess for adequacy in community support network, Educate family and significant other(s) on suicide prevention, Complete Psychosocial Assessment, Interpersonal group therapy.  Evaluation of Outcomes: Progressing   Progress in Treatment: Attending groups:Yes Participating in groups: No. Taking medication as prescribed: Yes. Toleration  medication: Yes. Family/Significant other contact made: Yes, individual(s) contacted:  sister Patient understands diagnosis: Yes. Discussing patient identified problems/goals with staff: Yes. Medical problems stabilized or resolved: Yes. Denies suicidal/homicidal ideation: Yes. Issues/concerns per patient self-inventory: No. Other:   New problem(s) identified: None   New Short Term/Long Term Goal(s):medication stabilization, elimination of SI thoughts, development of comprehensive mental wellness plan.   Patient Goals: "I want help with my anxiety. It is to a point where it is overwhelming and I worry about everything".   Discharge Plan or Barriers: CSW will continue to assess for an appropriate discharge plan.   Reason for Continuation of Hospitalization: Anxiety Depression Medication stabilization  Estimated Length of Stay: 1 day.  Attendees: Patient: 06/10/2017   Physician: Dr Jama Flavorsobos, MD 06/10/2017   Nursing: Shelda JakesPatty Duke, RN 06/10/2017   RN Care Manager: 06/10/2017   Social Worker: Daleen SquibbGreg Lora Glomski, LCSW 06/10/2017   Recreational Therapist:  06/10/2017   Other:  06/10/2017   Other:  06/10/2017   Other: 06/10/2017           Scribe for Treatment Team: Lorri FrederickWierda, Abhijot Straughter Jon, LCSW 06/10/2017 10:32 AM

## 2017-06-10 NOTE — Progress Notes (Signed)
  [facility] :  Will you be returning to the same living situation after discharge:  Yes,  patient is returning home with her sister At discharge, do you have transportation home?: Yes,  patient's sister is picking her up at discharge Do you have the ability to pay for your medications: Yes,  Medicare; SSDI income  Release of information consent forms completed and in the chart;  Patient's signature needed at discharge.  Patient to Follow up at: Follow-up Information    [facility]-Dr. Thelma CompVincent Izediuno,MD,MRCPsych. Go on 06/15/2017.   Why:  Appointment is Wednesday, 06/15/17 at 10:00am. Please be sure to bring your Photo ID, SSN(card), any insurance information and any discharge paperwork from the hospital.  Contact information: [address], [address] [place], [place], [address]  Phone:[phone] Fax:[phone]          Next level of care provider [facility]:yes  Safety Planning and Suicide Prevention discussed: Yes,  with the patient's sister  Have you used any form of tobacco in the last 30 days? (Cigarettes, Smokeless Tobacco, Cigars, and/or Pipes): No  Has patient been referred to the Quitline?: Patient refused referral  Patient has been referred for addiction treatment: N/A  Maeola SarahJolan E Domenique Southers, LCSWA 06/10/2017, 11:27 AM

## 2017-06-10 NOTE — Progress Notes (Signed)
Nursing note 7p-7a  Pt observed in room majority of this shift shaking and checking multiple adult briefs that are several bags inside room. Pt noted ambulating with walker with yellow socks. Displayed a flatt affect and  Anxious mood upon interaction with this Clinical research associatewriter. Medication administration required a lot of redirection, repeated education, and assistance. During med pass pt was holding a sharp hair pin. This RN educated pt that she cant have this and it is considered contraban. Pt verbalized understanding and was cooperative when handing over th hair pin.  Pt continues to be fixated on the adult briefs, size and type. Pt denies pain ,denies SI/HI, and also denies audio or visual hallucinations at this time.  RN was called to assist pt to bed where pt became agitated and demanding her briefs be changed. This RN and MHT assisted, redirected pt when making a choice on what brief to wear. Pt refused assistance and refused every type of brief in her room. Pt was assisted to bathroom to be changed as requested by pt, pt refused to be change, pt was the assisted back to bed where again she refused to be assisted in bed. RN instructed MHT to to get assistance of female staff. Second Rn came and both this Clinical research associatewriter and the second RN continued to encourage and  redirect Pt on choice of brief and to be changed.  Eventually both RNs was able to assist pt to stand and change the pt. Pt was assisted to bed and  continues to remain safe on the unit and is observed by rounding every 15 min. Pt resting in bed with eyes closed with no signs or symptoms of pain or distress noted. RN will continue to monitor.

## 2017-06-10 NOTE — Progress Notes (Signed)
Pt is currently laying in bed with eyes open. Pt appears calm. Will continue with POC.

## 2017-06-10 NOTE — Progress Notes (Signed)
1:1 observation:  Patient on 1:1 due high fall risk.  Patient is unsteady on her feet due to IM medications given earlier this morning.  Per MD, she will stay on a 1:1 until sister arrives at 1100 and a determination will be made as to whether she will be discharged.

## 2017-06-10 NOTE — Progress Notes (Addendum)
Writer was doing Q 15 checks and saw that Pt was attempting to get out of bed. Writer and a few other staff members was called to access Pt's needs. Pt was not able to voice concerns to Clinical research associatewriter. Pt was observed tremulous and preoccupied with pants/briefs. Writer asked if Pt was in pain. Pt stated "no". Writer asked if Pt needed assistance with bathroom. Pt stated "no". Pt is not able to follow commands at this time. Pt is currently agitated/irritable/confused. Pt remains preoccupied with trying to get out of bed. Per safety measures to prevent a fall, Clinical research associatewriter and staff members was present at bedside.Provider on call Maryjean MornCharles Kober was contacted. See MAR. Vitals obtained and WNL. Will continue with POC.

## 2017-06-10 NOTE — Progress Notes (Signed)
1:1 observation:  Patient put on 1:1 observation per MD due extremely high fall risk.  Patient had to be medicated with IMs this morning due to agitation.  She is sitting on her bed half on/half off with her pants half way off.  She is confused and continues to ruminate over depends and pads.  Patient is supposed to be discharged today.

## 2017-06-10 NOTE — Progress Notes (Signed)
Patient has been ambulatory.  She is ambulating well with her walker.  She has taken a bath with the assistance of MHT.  Patient is less tremulous and anxious.  She states she is ready for discharge.  She denies any thoughts of self harm.  Her sister will arrive at 1100 to possibly take her back home.

## 2017-06-10 NOTE — [facility] Suicide Risk Assessment (Addendum)
[facility] Discharge Suicide Risk Assessment   Principal Problem: depression, anxiety Discharge Diagnoses:  Patient Active Problem List   Diagnosis Date Noted  . Generalized anxiety disorder [F41.1] 05/26/2017  . Major depressive disorder, recurrent severe without psychotic features (HCC) [F33.2] 05/26/2017  . Hypercholesterolemia [E78.00] 03/22/2017  . Nonspecific (abnormal) findings on radiological and other examination of body structure [793] 12/26/2006  . NONSPECIFIC ABNORM FIND RAD&OTH EXAM LUNG FIELD [R93.0] 12/26/2006  . ANEMIA-IRON DEFICIENCY [D50.9] 12/15/2006  . Depression [F32.9] 12/15/2006  . Allergic rhinitis [J30.9] 12/15/2006    Total Time spent with patient: 30 minutes  Musculoskeletal: Strength & Muscle Tone: within normal limits Gait & Station: walks with walker Patient leans: N/A  Psychiatric Specialty Exam: ROS denies chest pain, no shortness of breath, no vomiting , no fever, no chills   Blood pressure 119/68, pulse (!) 114, temperature 98.2 F (36.8 C), temperature source Oral, resp. rate 18, height  (1.702 m), weight 55.1 kg (121 lb 8 oz), SpO2 98 %.Body mass index is 19.03 kg/m.  General Appearance: improved grooming   Eye Contact::  Fair- improving   Speech:  Normal Rate409  Volume:  improving \  Mood:  anxious, but improved today. Acknowledges she feels better   Affect:  anxious, but to lesser degree than on admission  Thought Process:  Linear and Descriptions of Associations: Intact  Orientation:  Full (Time, Place, and Person)  Thought Content:  no hallucinations, not internally preoccupied, no delusions expressed, less severely ruminative  Suicidal Thoughts:  No denies suicidal or self injurious ideations, denies homicidal or violent ideations.  Homicidal Thoughts:  No  Memory:  recent and remote grossly intact   Judgement:  Other:  fair- improving   Insight:  Fair  Psychomotor Activity:  less tremulous, less agitated today  Concentration:  Fair   Recall:  [employer] of Knowledge:Fair  Language: Good  Akathisia:  Negative  Handed:  Right  AIMS (if indicated):     Assets:  Desire for Improvement Resilience Social Support  Sleep:  Number of Hours: 3  Cognition: WNL  ADL's: improving    Mental Status Per Nursing Assessment::   On Admission:  NA  Demographic Factors:  67 year old female, divorced, no children, lives with sister   Loss Factors: Medication non compliance  Historical Factors: Long history of anxiety, history of depression, no prior psychiatric admissions, no history of suicidal attempts   Risk Reduction Factors:   Sense of responsibility to family, Living with another person, especially a relative and Positive social support  Continued Clinical Symptoms:  Yesterday patient presented with increased anxiety and ruminations, expressing concerns about not wanting to go to a Nursing Home. Today significantly better and further reassured when her sister arrived on unit to pick her up.   Today presents alert, attentive, better groomed and bathed this AM, presents with improved eye contact and improved speech today . Reports ongoing anxiety, but acknowledges she feels better at this time. Mood improved. No thought disorder, no suicidal or self injurious ideations, no homicidal or violent ideations. No hallucinations, no delusions . Future oriented, states she is looking forward to returning home and seeing her pet dogs again. Sister corroborates that patient is improved compared to admission and closer to baseline . Both sister and patient state that she will likely feel better at home , in familiar surroundings with her family to support. Sister corroborates family is very supportive, and that patient eats better at home with family support .  We reviewed importance of following up with her PCP for medical monitoring and management and to follow up on renal function and on  reported intermittent vaginal bleeding and (+)  Hgb in urine .  Denies medication side effects.  Cognitive Features That Contribute To Risk:  No gross cognitive deficits noted upon discharge. Is alert , attentive, and oriented x 3   Suicide Risk:  Mild:  Suicidal ideation of limited frequency, intensity, duration, and specificity.  There are no identifiable plans, no associated intent, mild dysphoria and related symptoms, good self-control (both objective and subjective assessment), few other risk factors, and identifiable protective factors, including available and accessible social support.  Follow-up Information    [facility]-Dr. Thelma Comp. Go on 06/15/2017.   Why:  Appointment is Wednesday, 06/15/17 at 10:00am. Please be sure to bring your Photo ID, SSN(card), any insurance information and any discharge paperwork from the hospital.  Contact information: [address], [address] [place], [place], [address]  Phone:[phone] [phone]          Plan Of Care/Follow-up recommendations:  Activity:  as tolerated  Diet:  regular Tests:  NA Other:  See below  Patient requesting discharge, plans to return home, sister on unit to pick her up and assist .  No grounds for involuntary commitment at this time Patient ambulates with walker. Follow up as above Plans to follow up with her established PCP.    Craige Cotta, MD 06/10/2017, 11:27 AM

## 2017-06-10 NOTE — Plan of Care (Signed)
Pt continues to be fixated on the adult briefs.

## 2017-06-10 NOTE — Progress Notes (Signed)
Discharge note:  Patient discharged home per MD order.  She received all personal belongings from locker and unit.  Reviewed AVS/transition record with patient and her sister.  She will follow up with [facility].  She denies any thoughts of self harm.  Patient left with prescriptions of her medications.  Patient left ambulatory with her brother and sister.

## 2017-06-10 NOTE — Discharge Summary (Addendum)
Physician Discharge Summary Note  Patient:  Amanda Hall is an 67 y.o., female MRN:  [id number] DOB:  01-10-1951 Patient phone:  [phone] (home)  Patient address:   [address] [place] [address],  Total Time spent with patient: 20 minutes  Date of Admission:  05/26/2017 Date of Discharge: 06/10/17  Reason for Admission:  Worsening anxiety and depression  Principal Problem: Major depressive disorder, recurrent severe without psychotic features [facility]) Discharge Diagnoses: Patient Active Problem List   Diagnosis Date Noted  . Generalized anxiety disorder [F41.1] 05/26/2017  . Major depressive disorder, recurrent severe without psychotic features (HCC) [F33.2] 05/26/2017  . Hypercholesterolemia [E78.00] 03/22/2017  . Nonspecific (abnormal) findings on radiological and other examination of body structure [793] 12/26/2006  . NONSPECIFIC ABNORM FIND RAD&OTH EXAM LUNG FIELD [R93.0] 12/26/2006  . ANEMIA-IRON DEFICIENCY [D50.9] 12/15/2006  . Depression [F32.9] 12/15/2006  . Allergic rhinitis [J30.9] 12/15/2006    Past Psychiatric History: History of anxiety/ history of depression.  Denies prior psychiatric admissions, states she has never attempted suicide, denies any history of self cutting or self-injurious behaviors, denies history of psychosis, denies history of PTSD.  Does endorse history of severe depression and also of anxiety, which she describes mostly as having panic attacks and agoraphobia as well as worrying excessively. Admission UDS positive for benzodiazepines (patient reports she is prescribed Ativan), admission BAL negative.  Past Medical History:  Past Medical History:  Diagnosis Date  . Allergy   . Anemia   . Anxiety   . Depression   . Hemorrhoids     Past Surgical History:  Procedure Laterality Date  . arm surgery     Left  . TUBAL LIGATION     Family History:  Family History  Problem Relation Age of Onset  . Stroke Mother   . Hypertension Sister    . Cancer Neg Hx        breat and colon hx  . Diabetes Neg Hx        family   Family Psychiatric  History: Reports grandfather had history of depression and committed suicide  Social History:  Social History   Substance and Sexual Activity  Alcohol Use No     Social History   Substance and Sexual Activity  Drug Use No    Social History   Socioeconomic History  . Marital status: Divorced    Spouse name: Not on file  . Number of children: Not on file  . Years of education: Not on file  . Highest education level: Not on file  Occupational History  . Not on file  Social Needs  . Financial resource strain: Not on file  . Food insecurity:    Worry: Not on file    Inability: Not on file  . Transportation needs:    Medical: Not on file    Non-medical: Not on file  Tobacco Use  . Smoking status: Never Smoker  . Smokeless tobacco: Never Used  Substance and Sexual Activity  . Alcohol use: No  . Drug use: No  . Sexual activity: Not Currently  Lifestyle  . Physical activity:    Days per week: Not on file    Minutes per session: Not on file  . Stress: Not on file  Relationships  . Social connections:    Talks on phone: Not on file    Gets together: Not on file    Attends religious service: Not on file    Active member of club or organization: Not on  file    Attends meetings of clubs or organizations: Not on file    Relationship status: Not on file  Other Topics Concern  . Not on file  Social History Narrative  . Not on file    Hospital Course:   05/27/17 [facility] MD Assessment: 67 year old female, presented to the emergency room due to worsening depression and anxiety.  At this time emphasizes anxiety as a major symptom , states that she feels her anxiety has become "unbearable" .  Describes her anxiety as a tendency to worry excessively .  She endorses neuro vegetative symptoms of depression to include loss of appetite.  States she has lost 20 to 30 pounds over recent  weeks. Of note patient denies suicidal plans or intentions. Presents with bruises on upper extremities- denies falls. Denies psychotic symptoms. Patient currently a fair historian and has difficulty reporting whether she was taking medications regularly. As per chart,recent change in PCP has been a major stressor, chart notes indicate medication noncompliance ( had run out of medication) . Patient does state she had been taking Ativan as prescribed recently . States " I know because my sister made sure I took them". Denies abusing or misusing . Admission UDS positive for BZDs .  06/10/17 [facility] MD SRA: Burgess Estelle patient presented with increased anxiety and ruminations, expressing concerns about not wanting to go to a Nursing Home. Today significantly better and further reassured when her sister arrived on unit to pick her up.  Today presents alert, attentive, better groomed and bathed this AM, presents with improved Hall contact and improved speech today . Reports ongoing anxiety, but acknowledges she feels better at this time. Mood improved. No thought disorder, no suicidal or self injurious ideations, no homicidal or violent ideations. No hallucinations, no delusions . Future oriented, states she is looking forward to returning home and seeing her pet dogs again. Sister corroborates that patient is improved compared to admission and closer to baseline . Both sister and patient state that she will likely feel better at home , in familiar surroundings with her family to support. Sister corroborates family is very supportive, and that patient eats better at home with family support . We reviewed importance of following up with her PCP for medical monitoring and management and to follow up on renal function and on  reported intermittent vaginal bleeding and (+) Hgb in urine . Denies medication side effects  Patient remained on the [facility] unit for 14 days. The patient stabilized on medication and therapy. Patient was  discharged on Luvox 150 mg Daily, Ativan 1 mg TID, Remeron 45 mg QHS, Trileptal 75 mg BID and 150 mg QHS and Seroquel 50 mg QHS. Patient has shown improvement with improved mood, affect, sleep, appetite, and interaction. Patient has attended group and participated. Patient has been seen in the day room interacting with peers and staff appropriately. Patient denies any SI/HI/AVH and contracts for safety. Patient agrees to follow up at [facility]. Patient is provided with prescriptions for their medications upon discharge.    Physical Findings: AIMS: Facial and Oral Movements Muscles of Facial Expression: None, normal Lips and Perioral Area: None, normal Jaw: None, normal Tongue: None, normal,Extremity Movements Upper (arms, wrists, hands, fingers): Moderate Lower (legs, knees, ankles, toes): Minimal, Trunk Movements Neck, shoulders, hips: Minimal, Overall Severity Severity of abnormal movements (highest score from questions above): Moderate Incapacitation due to abnormal movements: Mild Patient's awareness of abnormal movements (rate only patient's report): Aware, mild distress, Dental Status Current problems with  teeth and/or dentures?: Yes Does patient usually wear dentures?: Yes  CIWA:  CIWA-Ar Total: 15 COWS:  COWS Total Score: 12  Musculoskeletal: Strength & Muscle Tone: within normal limits Gait & Station: normal Patient leans: N/A  Psychiatric Specialty Exam: General Appearance: improved grooming   Hall Contact::  Fair- improving   Speech:  Normal Rate409  Volume:  improving \  Mood:  anxious, but improved today. Acknowledges she feels better   Affect:  anxious, but to lesser degree than on admission  Thought Process:  Linear and Descriptions of Associations: Intact  Orientation:  Full (Time, Place, and Person)  Thought Content:  no hallucinations, not internally preoccupied, no delusions expressed, less severely ruminative  Suicidal Thoughts:  No denies suicidal or self  injurious ideations, denies homicidal or violent ideations.  Homicidal Thoughts:  No  Memory:  recent and remote grossly intact   Judgement:  Other:  fair- improving   Insight:  Fair  Psychomotor Activity:  less tremulous, less agitated today  Concentration:  Fair  Recall:  Fair  Fund of Knowledge:Fair  Language: Good  Akathisia:  Negative  Handed:  Right  AIMS (if indicated):     Assets:  Desire for Improvement Resilience Social Support  Sleep:  Number of Hours: 3  Cognition: WNL  ADL's: improving      Have you used any form of tobacco in the last 30 days? (Cigarettes, Smokeless Tobacco, Cigars, and/or Pipes): No  Has this patient used any form of tobacco in the last 30 days? (Cigarettes, Smokeless Tobacco, Cigars, and/or Pipes) Yes, No  Blood Alcohol level:  Lab Results  Component Value Date   ETH <10 05/25/2017   ETH <10 05/18/2017    Metabolic Disorder Labs:  No results found for: HGBA1C, MPG No results found for: PROLACTIN Lab Results  Component Value Date   CHOL 189 03/22/2017   TRIG 218.0 (H) 03/22/2017   HDL 50.60 03/22/2017   CHOLHDL 4 03/22/2017   VLDL 43.6 (H) 03/22/2017   LDLCALC 226 (H) 07/13/2016    See Psychiatric Specialty Exam and Suicide Risk Assessment completed by Attending Physician prior to discharge.  Discharge destination:  Home  Is patient on multiple antipsychotic therapies at discharge:  No   Has Patient had three or more failed trials of antipsychotic monotherapy by history:  No  Recommended Plan for Multiple Antipsychotic Therapies: NA   Allergies as of 06/10/2017      Reactions   Codeine Other (See Comments)   "makes head fell crazy"   Sulfonamide Derivatives Nausea Only   Tetanus Toxoid Other (See Comments)   "Made a Knot"      Medication List    STOP taking these medications   atorvastatin 20 MG tablet Commonly known as:  LIPITOR   FLUoxetine 20 MG capsule Commonly known as:  PROZAC   gabapentin 100 MG  capsule Commonly known as:  NEURONTIN     TAKE these medications     Indication  fluvoxaMINE 50 MG tablet Commonly known as:  LUVOX Take 3 tablets (150 mg total) by mouth at bedtime. For mood control  Indication:  mood stability   hydrocortisone cream 1 % Apply to affected area 2 times daily  Indication:  Per PCP   LORazepam 1 MG tablet Commonly known as:  ATIVAN Take 1 tablet (1 mg total) by mouth 2 (two) times daily.  Indication:  Feeling Anxious   megestrol 40 MG tablet Commonly known as:  MEGACE Take 1 tablet (40 mg total)  by mouth daily. Start taking on:  06/11/2017  Indication:  improve appetite   mirtazapine 45 MG tablet Commonly known as:  REMERON Take 1 tablet (45 mg total) by mouth at bedtime. For mood control  Indication:  mood stability   OXcarbazepine 150 MG tablet Commonly known as:  TRILEPTAL Take 1 tablet (150 mg total) by mouth at bedtime. For mood stability  Indication:  Mood stability   OXcarbazepine 150 MG tablet Commonly known as:  TRILEPTAL Take 0.5 tablets (75 mg total) by mouth 2 (two) times daily. Mood control  Indication:  mood stability   polyethylene glycol packet Commonly known as:  MIRALAX Take 17 g by mouth 2 (two) times daily.  Indication:  Constipation   QUEtiapine 50 MG tablet Commonly known as:  SEROQUEL Take 1 tablet (50 mg total) by mouth at bedtime. For mood control  Indication:  mood stability      Follow-up Information    [facility] The[facility]. Go on 06/15/2017.   Why:  Appointment is Wednesday, 06/15/17 at 10:00am. Please be sure to bring your Photo ID, SSN(card), any insurance information and any discharge paperwork from the hospital.  Contact information: [address] [place], [place], [address]  [phone] [phone]          Follow-up recommendations:  Continue activity as tolerated. Continue diet as recommended by your PCP. Ensure to keep all appointments with  outpatient providers.  Comments:  Patient is instructed prior to discharge to: Take all medications as prescribed by his/her mental healthcare provider. Report any adverse effects and or reactions from the medicines to his/her outpatient provider promptly. Patient has been instructed & cautioned: To not engage in alcohol and or illegal drug use while on prescription medicines. In the event of worsening symptoms, patient is instructed to call the crisis hotline, 911 and or go to the nearest ED for appropriate evaluation and treatment of symptoms. To follow-up with his/her primary care provider for your other medical issues, concerns and or health care needs.    Signed: Gerlene Burdock Money, FNP 06/10/2017, 11:44 AM    Patient seen, Suicide Assessment Completed.  Disposition Plan Reviewed

## 2017-06-10 NOTE — Progress Notes (Signed)
Recreation Therapy Notes  Date: 5.31.19 Time: 0930 Location: 400 Hall Dayroom  Group Topic: Stress Management  Goal Area(s) Addresses:  Patient will verbalize importance of using healthy stress management.  Patient will identify positive emotions associated with healthy stress management.   Intervention: Stress Management  Activity :  Progressive Muscle Relaxation.  LRT lead group in progressive muscle relaxation.  Patients were to tense each muscle one at a time and then release the tension.  Patients were to follow along as LRT lead them through the activity.    Education:  Stress Management, Discharge Planning.   Education Outcome: Acknowledges edcuation/In group clarification offered/Needs additional education  Clinical Observations/Feedback:  Pt did not attend group.      Caroll RancherMarjette Zeddie Njie, LRT/CTRS         Caroll RancherLindsay, Brett Soza A 06/10/2017 11:23 AM

## 2017-06-12 ENCOUNTER — Encounter (HOSPITAL_COMMUNITY): Payer: Self-pay | Admitting: Emergency Medicine

## 2017-06-12 ENCOUNTER — Emergency Department (HOSPITAL_COMMUNITY)
Admission: EM | Admit: 2017-06-12 | Discharge: 2017-06-14 | Disposition: A | Discharge status: Home / Self Care | Payer: Medicare Other | Attending: Emergency Medicine | Admitting: Emergency Medicine

## 2017-06-12 DIAGNOSIS — Z79899 Other long term (current) drug therapy: Secondary | ICD-10-CM | POA: Diagnosis not present

## 2017-06-12 DIAGNOSIS — N390 Urinary tract infection, site not specified: Secondary | ICD-10-CM | POA: Insufficient documentation

## 2017-06-12 DIAGNOSIS — R2681 Unsteadiness on feet: Secondary | ICD-10-CM | POA: Diagnosis not present

## 2017-06-12 DIAGNOSIS — M6281 Muscle weakness (generalized): Secondary | ICD-10-CM | POA: Insufficient documentation

## 2017-06-12 DIAGNOSIS — F332 Major depressive disorder, recurrent severe without psychotic features: Secondary | ICD-10-CM | POA: Diagnosis not present

## 2017-06-12 DIAGNOSIS — G4700 Insomnia, unspecified: Secondary | ICD-10-CM | POA: Diagnosis not present

## 2017-06-12 DIAGNOSIS — R402441 Other coma, without documented Glasgow coma scale score, or with partial score reported, in the field [EMT or ambulance]: Secondary | ICD-10-CM | POA: Diagnosis not present

## 2017-06-12 DIAGNOSIS — F411 Generalized anxiety disorder: Secondary | ICD-10-CM | POA: Insufficient documentation

## 2017-06-12 DIAGNOSIS — R456 Violent behavior: Secondary | ICD-10-CM | POA: Diagnosis present

## 2017-06-12 DIAGNOSIS — R000 Tachycardia, unspecified: Secondary | ICD-10-CM | POA: Diagnosis not present

## 2017-06-12 DIAGNOSIS — F29 Unspecified psychosis not due to a substance or known physiological condition: Secondary | ICD-10-CM | POA: Diagnosis not present

## 2017-06-12 DIAGNOSIS — R0689 Other abnormalities of breathing: Secondary | ICD-10-CM | POA: Diagnosis not present

## 2017-06-12 LAB — ACETAMINOPHEN LEVEL

## 2017-06-12 LAB — COMPREHENSIVE METABOLIC PANEL
ALBUMIN: 4 g/dL (ref 3.5–5.0)
ALT: 37 U/L (ref 14–54)
ANION GAP: 12 (ref 5–15)
AST: 75 U/L — ABNORMAL HIGH (ref 15–41)
Alkaline Phosphatase: 72 U/L (ref 38–126)
BUN: 45 mg/dL — ABNORMAL HIGH (ref 6–20)
CO2: 22 mmol/L (ref 22–32)
Calcium: 10.1 mg/dL (ref 8.9–10.3)
Chloride: 111 mmol/L (ref 101–111)
Creatinine, Ser: 1.45 mg/dL — ABNORMAL HIGH (ref 0.44–1.00)
GFR calc Af Amer: 43 mL/min — ABNORMAL LOW (ref >60)
GFR calc non Af Amer: 37 mL/min — ABNORMAL LOW (ref >60)
GLUCOSE: 129 mg/dL — AB (ref 65–99)
POTASSIUM: 3.1 mmol/L — AB (ref 3.5–5.1)
SODIUM: 145 mmol/L (ref 135–145)
Total Bilirubin: 0.5 mg/dL (ref 0.3–1.2)
Total Protein: 6.9 g/dL (ref 6.5–8.1)

## 2017-06-12 LAB — CBC
HEMATOCRIT: 30.5 % — AB (ref 36.0–46.0)
HEMOGLOBIN: 10.2 g/dL — AB (ref 12.0–15.0)
MCH: 29.3 pg (ref 26.0–34.0)
MCHC: 33.4 g/dL (ref 30.0–36.0)
MCV: 87.6 fL (ref 78.0–100.0)
Platelets: 292 10*3/uL (ref 150–400)
RBC: 3.48 MIL/uL — AB (ref 3.87–5.11)
RDW: 13.6 % (ref 11.5–15.5)
WBC: 5.3 10*3/uL (ref 4.0–10.5)

## 2017-06-12 LAB — SALICYLATE LEVEL: Salicylate Lvl: <7 mg/dL (ref 2.8–30.0)

## 2017-06-12 LAB — ETHANOL: Alcohol, Ethyl (B): <10 mg/dL (ref <10)

## 2017-06-12 MED ORDER — POTASSIUM CHLORIDE CRYS ER 20 MEQ PO TBCR
40.0000 meq | EXTENDED_RELEASE_TABLET | Freq: Once | ORAL | Status: AC
  Administered 2017-06-12: 40 meq via ORAL
  Filled 2017-06-12: qty 2

## 2017-06-12 MED ORDER — SODIUM CHLORIDE 0.9 % IV BOLUS
1000.0000 mL | Freq: Once | INTRAVENOUS | Status: AC
  Administered 2017-06-12: 1000 mL via INTRAVENOUS

## 2017-06-12 NOTE — ED Notes (Signed)
Bed: WA29 Expected date:  Expected time:  Means of arrival:  Comments: 

## 2017-06-12 NOTE — ED Triage Notes (Signed)
Patient here from home with complaints of aggressive behavior and delusions. Released from a behavioral health hospital (unknown) 3 days ago. Has not had meds in 3 days, refusing to take. Haldol 5 2.5 IV, 2.5 IM and Versed 2.5 IV given in route.

## 2017-06-12 NOTE — ED Notes (Signed)
Pt panic stricken when staff enter room. Unable to collect accurate HR due to pt tremors and yelling in panic state. Unable to calm and console pt. Will contact provider.
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
## 2017-06-12 NOTE — ED Provider Notes (Signed)
[facility]-EMERGENCY DEPT Provider Note   CSN: [id number] Arrival date & time: 06/12/17  1454     History   Chief Complaint Chief Complaint  Patient presents with  . Aggressive Behavior  . Medical Clearance    HPI Amanda Hall is a 67 y.o. female.  Patient w hx depression, recent d/c from behavioral health facility, from home with refusing to take meds for past few days, and agitated/aggressive behavior and delusional thoughts.  Patient uncooperative w hx in ED - level 5 caveat, psychiatric illness.   The history is provided by the patient and the EMS personnel. The history is limited by the condition of the patient.    Past Medical History:  Diagnosis Date  . Allergy   . Anemia   . Anxiety   . Depression   . Hemorrhoids     Patient Active Problem List   Diagnosis Date Noted  . Generalized anxiety disorder 05/26/2017  . Major depressive disorder, recurrent severe without psychotic features (HCC) 05/26/2017  . Hypercholesterolemia 03/22/2017  . Nonspecific (abnormal) findings on radiological and other examination of body structure 12/26/2006  . NONSPECIFIC ABNORM FIND RAD&OTH EXAM LUNG FIELD 12/26/2006  . ANEMIA-IRON DEFICIENCY 12/15/2006  . Depression 12/15/2006  . Allergic rhinitis 12/15/2006    Past Surgical History:  Procedure Laterality Date  . arm surgery     Left  . TUBAL LIGATION       OB History   None      Home Medications    Prior to Admission medications   Medication Sig Start Date End Date Taking? Authorizing Provider  fluvoxaMINE (LUVOX) 50 MG tablet Take 3 tablets (150 mg total) by mouth at bedtime. For mood control 06/10/17   Money, Gerlene Burdockravis B, FNP  hydrocortisone cream 1 % Apply to affected area 2 times daily 04/18/17   Palumbo, April, MD  LORazepam (ATIVAN) 1 MG tablet Take 1 tablet (1 mg total) by mouth 2 (two) times daily. 03/22/17   Gordy SaversKwiatkowski, Peter F, MD  megestrol (MEGACE) 40 MG tablet Take 1 tablet (40 mg total) by  mouth daily. 06/11/17   Money, Gerlene Burdockravis B, FNP  mirtazapine (REMERON) 45 MG tablet Take 1 tablet (45 mg total) by mouth at bedtime. For mood control 06/10/17   Money, Gerlene Burdockravis B, FNP  OXcarbazepine (TRILEPTAL) 150 MG tablet Take 1 tablet (150 mg total) by mouth at bedtime. For mood stability 06/10/17   Money, Gerlene Burdockravis B, FNP  OXcarbazepine (TRILEPTAL) 150 MG tablet Take 0.5 tablets (75 mg total) by mouth 2 (two) times daily. Mood control 06/10/17   Money, Gerlene Burdockravis B, FNP  polyethylene glycol [facility](MIRALAX) packet Take 17 g by mouth 2 (two) times daily. 04/18/17   Palumbo, April, MD  QUEtiapine (SEROQUEL) 50 MG tablet Take 1 tablet (50 mg total) by mouth at bedtime. For mood control 06/10/17   Money, Gerlene Burdockravis B, FNP    Family History Family History  Problem Relation Age of Onset  . Stroke Mother   . Hypertension Sister   . Cancer Neg Hx        breat and colon hx  . Diabetes Neg Hx        family    Social History Social History   Tobacco Use  . Smoking status: Never Smoker  . Smokeless tobacco: Never Used  Substance Use Topics  . Alcohol use: No  . Drug use: No     Allergies   Codeine; Sulfonamide derivatives; and Tetanus toxoid   Review of Systems  Review of Systems  Unable to perform ROS: Psychiatric disorder  Constitutional: Negative for fever.  level 5 caveat - psych illness   Physical Exam Updated Vital Signs BP (!) 145/80 (BP Location: Right Arm)   Pulse 93   Temp 98.7 F (37.1 C) (Oral)   Resp (!) 22   SpO2 96%   Physical Exam  Constitutional: She appears well-developed and well-nourished.  Very thin, frail appearing.  HENT:  Head: Atraumatic.  Eyes: Pupils are equal, round, and reactive to light. Conjunctivae are normal. No scleral icterus.  Neck: Neck supple. No tracheal deviation present. No thyromegaly present.  Cardiovascular: Normal rate, regular rhythm, normal heart sounds and intact distal pulses. Exam reveals no gallop and no friction rub.  No murmur  heard. Pulmonary/Chest: Effort normal and breath sounds normal. No respiratory distress.  Abdominal: Soft. Normal appearance and bowel sounds are normal. She exhibits no distension. There is no tenderness.  Genitourinary:  Genitourinary Comments: No cva tenderness  Musculoskeletal: She exhibits no edema.  Neurological: She is alert.  Moves bil extremities purposefully w good strength.   Skin: Skin is warm and dry. No rash noted.  Psychiatric:  Flat affect. Appears withdrawn/depressed. Not answering questions.   Nursing note and vitals reviewed.    ED Treatments / Results  Labs (all labs ordered are listed, but only abnormal results are displayed) Results for orders placed or performed during the hospital encounter of 06/12/17  Comprehensive metabolic panel  Result Value Ref Range   Sodium 145 135 - 145 mmol/L   Potassium 3.1 (L) 3.5 - 5.1 mmol/L   Chloride 111 101 - 111 mmol/L   CO2 22 22 - 32 mmol/L   Glucose, Bld 129 (H) 65 - 99 mg/dL   BUN 45 (H) 6 - 20 mg/dL   Creatinine, Ser 1.61 (H) 0.44 - 1.00 mg/dL   Calcium 09.6 8.9 - 04.5 mg/dL   Total Protein 6.9 6.5 - 8.1 g/dL   Albumin 4.0 3.5 - 5.0 g/dL   AST 75 (H) 15 - 41 U/L   ALT 37 14 - 54 U/L   Alkaline Phosphatase 72 38 - 126 U/L   Total Bilirubin 0.5 0.3 - 1.2 mg/dL   GFR calc non Af Amer 37 (L) >60 mL/min   GFR calc Af Amer 43 (L) >60 mL/min   Anion gap 12 5 - 15  Ethanol  Result Value Ref Range   Alcohol, Ethyl (B) <10 <10 mg/dL  Salicylate level  Result Value Ref Range   Salicylate Lvl <7.0 2.8 - 30.0 mg/dL  Acetaminophen level  Result Value Ref Range   Acetaminophen (Tylenol), Serum <10 (L) 10 - 30 ug/mL  cbc  Result Value Ref Range   WBC 5.3 4.0 - 10.5 K/uL   RBC 3.48 (L) 3.87 - 5.11 MIL/uL   Hemoglobin 10.2 (L) 12.0 - 15.0 g/dL   HCT 40.9 (L) 81.1 - 91.4 %   MCV 87.6 78.0 - 100.0 fL   MCH 29.3 26.0 - 34.0 pg   MCHC 33.4 30.0 - 36.0 g/dL   RDW 78.2 95.6 - 21.3 %   Platelets 292 150 - 400 K/uL   Dg  Esophagus  Result Date: 06/02/2017 CLINICAL DATA:  67 year old female with history of dry mouth and difficulty swallowing. EXAM: ESOPHOGRAM / BARIUM SWALLOW / BARIUM TABLET STUDY TECHNIQUE: Combined double contrast and single contrast examination performed using effervescent crystals, thick barium liquid, and thin barium liquid. The patient was observed with fluoroscopy swallowing a 13 mm barium  sulphate tablet. FLUOROSCOPY TIME:  Fluoroscopy Time:  2 minutes and 0 seconds Radiation Exposure Index (if provided by the fluoroscopic device): 8 mGy COMPARISON:  None. FINDINGS: Double contrast images demonstrated a normal appearance of the esophageal mucosa. Multiple single swallow attempts were observed, which demonstrated normal esophageal motility. Full column esophagram demonstrated no esophageal mass, stricture or esophageal ring. No hiatal hernia. Water siphon test demonstrated no gastroesophageal reflux. A barium tablet was administered, which passed readily into the stomach. IMPRESSION: 1. Normal double contrast esophagram, as above. Electronically Signed   By: Trudie Reed M.D.   On: 06/02/2017 14:12   Dg Chest Portable 1 View  Result Date: 05/25/2017 CLINICAL DATA:  Shortness of breath and anxiety EXAM: PORTABLE CHEST 1 VIEW COMPARISON:  05/10/2017 FINDINGS: The heart size and mediastinal contours are within normal limits. Both lungs are clear. The visualized skeletal structures are unremarkable. IMPRESSION: No active disease. Electronically Signed   By: Alcide Clever M.D.   On: 05/25/2017 13:20    EKG None  Radiology No results found.  Procedures Procedures (including critical care time)  Medications Ordered in ED Medications - No data to display   Initial Impression / Assessment and Plan / ED Course  I have reviewed the triage vital signs and the nursing notes.  Pertinent labs & imaging results that were available during my care of the patient were reviewed by me and considered in  my medical decision making (see chart for details).  Labs.   Reviewed nursing notes and prior charts for additional history.   BH team consulted.   Labs reviewed - mild aki, suspect due to dehydration, recent poor po intake. Iv ns bolus. kcl po. Po fluids.  Recheck, alert and content. bh eval pending.  Disposition per [facility] team.   Final Clinical Impressions(s) / ED Diagnoses   Final diagnoses:  None    ED Discharge Orders    None       Cathren Laine, MD 06/12/17 1636

## 2017-06-13 DIAGNOSIS — F411 Generalized anxiety disorder: Secondary | ICD-10-CM

## 2017-06-13 DIAGNOSIS — G4700 Insomnia, unspecified: Secondary | ICD-10-CM

## 2017-06-13 LAB — BASIC METABOLIC PANEL
Anion gap: 13 (ref 5–15)
BUN: 30 mg/dL — ABNORMAL HIGH (ref 6–20)
CALCIUM: 9.8 mg/dL (ref 8.9–10.3)
CO2: 23 mmol/L (ref 22–32)
CREATININE: 1.11 mg/dL — AB (ref 0.44–1.00)
Chloride: 108 mmol/L (ref 101–111)
GFR calc Af Amer: 59 mL/min — ABNORMAL LOW (ref >60)
GFR, EST NON AFRICAN AMERICAN: 51 mL/min — AB (ref >60)
GLUCOSE: 123 mg/dL — AB (ref 65–99)
Potassium: 4.7 mmol/L (ref 3.5–5.1)
Sodium: 144 mmol/L (ref 135–145)

## 2017-06-13 LAB — RAPID URINE DRUG SCREEN, HOSP PERFORMED
AMPHETAMINES: NOT DETECTED
Barbiturates: NOT DETECTED
Benzodiazepines: POSITIVE — AB
Cocaine: NOT DETECTED
OPIATES: NOT DETECTED
Tetrahydrocannabinol: NOT DETECTED

## 2017-06-13 LAB — URINALYSIS, ROUTINE W REFLEX MICROSCOPIC
BILIRUBIN URINE: NEGATIVE
Bacteria, UA: NONE SEEN
GLUCOSE, UA: 50 mg/dL — AB
KETONES UR: 20 mg/dL — AB
NITRITE: NEGATIVE
PH: 5 (ref 5.0–8.0)
PROTEIN: NEGATIVE mg/dL
Specific Gravity, Urine: 1.017 (ref 1.005–1.030)
WBC, UA: >50 WBC/hpf — ABNORMAL HIGH (ref 0–5)

## 2017-06-13 LAB — CBC WITH DIFFERENTIAL/PLATELET
BASOS ABS: 0 10*3/uL (ref 0.0–0.1)
BASOS PCT: 0 %
EOS PCT: 0 %
Eosinophils Absolute: 0 10*3/uL (ref 0.0–0.7)
HEMATOCRIT: 35.7 % — AB (ref 36.0–46.0)
Hemoglobin: 12.1 g/dL (ref 12.0–15.0)
Lymphocytes Relative: 21 %
Lymphs Abs: 1.5 10*3/uL (ref 0.7–4.0)
MCH: 30.2 pg (ref 26.0–34.0)
MCHC: 33.9 g/dL (ref 30.0–36.0)
MCV: 89 fL (ref 78.0–100.0)
MONO ABS: 0.5 10*3/uL (ref 0.1–1.0)
MONOS PCT: 8 %
NEUTROS ABS: 5 10*3/uL (ref 1.7–7.7)
Neutrophils Relative %: 71 %
PLATELETS: 372 10*3/uL (ref 150–400)
RBC: 4.01 MIL/uL (ref 3.87–5.11)
RDW: 13.9 % (ref 11.5–15.5)
WBC: 7.1 10*3/uL (ref 4.0–10.5)

## 2017-06-13 MED ORDER — SODIUM CHLORIDE 0.9 % IV SOLN
1.0000 g | Freq: Once | INTRAVENOUS | Status: AC
  Administered 2017-06-13: 1 g via INTRAVENOUS
  Filled 2017-06-13: qty 1

## 2017-06-13 MED ORDER — OXCARBAZEPINE 150 MG PO TABS
150.0000 mg | ORAL_TABLET | Freq: Every day | ORAL | Status: DC
  Administered 2017-06-13: 150 mg via ORAL
  Filled 2017-06-13: qty 1

## 2017-06-13 MED ORDER — MEGESTROL ACETATE 40 MG PO TABS
40.0000 mg | ORAL_TABLET | Freq: Every day | ORAL | Status: DC
  Administered 2017-06-14: 40 mg via ORAL
  Filled 2017-06-13: qty 1

## 2017-06-13 MED ORDER — FLUVOXAMINE MALEATE 50 MG PO TABS
150.0000 mg | ORAL_TABLET | Freq: Every day | ORAL | Status: DC
  Administered 2017-06-13: 150 mg via ORAL
  Filled 2017-06-13 (×2): qty 3

## 2017-06-13 MED ORDER — LORAZEPAM 1 MG PO TABS
1.0000 mg | ORAL_TABLET | Freq: Once | ORAL | Status: DC
  Filled 2017-06-13: qty 1

## 2017-06-13 MED ORDER — LORAZEPAM 1 MG PO TABS: 1.0000 mg | ORAL_TABLET | Freq: Two times a day (BID) | ORAL | Status: DC

## 2017-06-13 MED ORDER — OXCARBAZEPINE 150 MG PO TABS
75.0000 mg | ORAL_TABLET | Freq: Two times a day (BID) | ORAL | Status: DC
  Administered 2017-06-14: 75 mg via ORAL
  Filled 2017-06-13 (×2): qty 0.5

## 2017-06-13 MED ORDER — HALOPERIDOL LACTATE 5 MG/ML IJ SOLN
5.0000 mg | Freq: Once | INTRAMUSCULAR | Status: AC
Start: 2017-06-13 — End: 2017-06-13
  Administered 2017-06-13: 5 mg via INTRAMUSCULAR
  Filled 2017-06-13: qty 1

## 2017-06-13 MED ORDER — CEPHALEXIN 500 MG PO CAPS
500.0000 mg | ORAL_CAPSULE | Freq: Two times a day (BID) | ORAL | Status: DC
  Administered 2017-06-14: 500 mg via ORAL
  Filled 2017-06-13: qty 1

## 2017-06-13 MED ORDER — LORAZEPAM 2 MG/ML IJ SOLN
1.0000 mg | Freq: Once | INTRAMUSCULAR | Status: AC
  Administered 2017-06-13: 21:00:00 via INTRAVENOUS
  Filled 2017-06-13: qty 1

## 2017-06-13 MED ORDER — LORAZEPAM 1 MG PO TABS
1.0000 mg | ORAL_TABLET | Freq: Two times a day (BID) | ORAL | Status: DC
  Administered 2017-06-13 – 2017-06-14 (×2): 1 mg via ORAL
  Filled 2017-06-13 (×2): qty 1

## 2017-06-13 MED ORDER — QUETIAPINE FUMARATE 50 MG PO TABS
50.0000 mg | ORAL_TABLET | Freq: Every day | ORAL | Status: DC
  Administered 2017-06-13: 50 mg via ORAL
  Filled 2017-06-13: qty 1

## 2017-06-13 MED ORDER — LORAZEPAM 2 MG/ML IJ SOLN
1.0000 mg | Freq: Once | INTRAMUSCULAR | Status: AC
  Administered 2017-06-13: 1 mg via INTRAMUSCULAR
  Filled 2017-06-13: qty 1

## 2017-06-13 MED ORDER — MIRTAZAPINE 30 MG PO TABS
45.0000 mg | ORAL_TABLET | Freq: Every day | ORAL | Status: DC
  Administered 2017-06-13: 45 mg via ORAL
  Filled 2017-06-13: qty 2

## 2017-06-13 MED ORDER — SODIUM CHLORIDE 0.9 % IV BOLUS
1000.0000 mL | Freq: Once | INTRAVENOUS | Status: AC
  Administered 2017-06-13: 1000 mL via INTRAVENOUS

## 2017-06-13 NOTE — ED Notes (Addendum)
Late note. CSW called me at the beginning of shift and stated that the patient was being d/c'd and that the family would call when they were on their way to pick her up. At the beginning of shift, I assessed patient and found the patient entire upper body shaking profusely and she was not looking at me or responding to me. She also was sitting on the side of bed with her head down. I asked the CSW to let Dr. Criss AlvineGoldston  know what I found on assessment. Dr. Criss AlvineGoldston looked over chart and came to assess patient. He immediately placed new orders for the patient (See Epic).

## 2017-06-13 NOTE — [facility] Suicide Risk Assessment (Signed)
Suicide Risk Assessment  Discharge Assessment   [facility]   Principal Problem: Generalized anxiety disorder Discharge Diagnoses:  Patient Active Problem List   Diagnosis Date Noted  . Generalized anxiety disorder [F41.1] 05/26/2017    Priority: High  . Major depressive disorder, recurrent severe without psychotic features (HCC) [F33.2] 05/26/2017  . Hypercholesterolemia [E78.00] 03/22/2017  . Nonspecific (abnormal) findings on radiological and other examination of body structure [793] 12/26/2006  . NONSPECIFIC ABNORM FIND RAD&OTH EXAM LUNG FIELD [R93.0] 12/26/2006  . ANEMIA-IRON DEFICIENCY [D50.9] 12/15/2006  . Depression [F32.9] 12/15/2006  . Allergic rhinitis [J30.9] 12/15/2006    Total Time spent with patient: 45 minutes  Musculoskeletal: Strength & Muscle Tone: within normal limits Gait & Station: normal Patient leans: N/A  Psychiatric Specialty Exam: Physical Exam  Nursing note and vitals reviewed. Constitutional: She is oriented to person, place, and time. She appears well-developed and well-nourished.  HENT:  Head: Normocephalic.  Cardiovascular: Normal rate.  Respiratory: Effort normal.  Musculoskeletal: Normal range of motion.  Neurological: She is alert and oriented to person, place, and time.  Psychiatric: Her speech is normal and behavior is normal. Judgment and thought content normal. Her mood appears anxious. Cognition and memory are normal.    Review of Systems  Psychiatric/Behavioral: The patient is nervous/anxious.   All other systems reviewed and are negative.   Blood pressure 139/81, pulse 97, temperature 98.3 F (36.8 C), temperature source Axillary, resp. rate 20, SpO2 98 %.There is no height or weight on file to calculate BMI.  General Appearance: Casual  Eye Contact:  Good  Speech:  Normal Rate  Volume:  Normal  Mood:  Anxious  Affect:  Congruent  Thought Process:  Coherent and Descriptions of Associations: Intact   Orientation:  Full (Time, Place, and Person)  Thought Content:  WDL and Logical  Suicidal Thoughts:  No  Homicidal Thoughts:  No  Memory:  Immediate;   Good Recent;   Good Remote;   Good  Judgement:  Fair  Insight:  Fair  Psychomotor Activity:  Normal  Concentration:  Concentration: Good and Attention Span: Good  Recall:  Good  Fund of Knowledge:  Fair  Language:  Good  Akathisia:  No  Handed:  Right  AIMS (if indicated):     Assets:  Leisure Time Physical Health Resilience Social Support  ADL's:  Intact  Cognition:  WNL  Sleep:       Mental Status Per Nursing Assessment::   On Admission:   anxiety  Demographic Factors:  Age 67 or older and Caucasian  Loss Factors: NA  Historical Factors: NA  Risk Reduction Factors:   Sense of responsibility to family, Living with another person, especially a relative and Positive social support  Continued Clinical Symptoms:  Anxiety, mild  Cognitive Features That Contribute To Risk:  None    Suicide Risk:  Minimal: No identifiable suicidal ideation.  Patients presenting with no risk factors but with morbid ruminations; may be classified as minimal risk based on the severity of the depressive symptoms    Plan Of Care/Follow-up recommendations:  Activity:  as tolerated Diet:  heart healthy diet  Adalae Baysinger, NP 06/13/2017, 3:08 PM

## 2017-06-13 NOTE — Consult Note (Addendum)
[facility] Consult    Reason for Consult:  Anxiety, delusions Referring Physician:  EDP Patient Identification: Amanda Hall MRN:  [id number] Principal Diagnosis: Generalized anxiety disorder Diagnosis:   Patient Active Problem List   Diagnosis Date Noted  . Generalized anxiety disorder [F41.1] 05/26/2017    Priority: High  . Major depressive disorder, recurrent severe without psychotic features ([place]) [F33.2] 05/26/2017  . Hypercholesterolemia [E78.00] 03/22/2017  . Nonspecific (abnormal) findings on radiological and other examination of body structure [793] 12/26/2006  . NONSPECIFIC ABNORM FIND RAD&OTH EXAM LUNG FIELD [R93.0] 12/26/2006  . ANEMIA-IRON DEFICIENCY [D50.9] 12/15/2006  . Depression [F32.9] 12/15/2006  . Allergic rhinitis [J30.9] 12/15/2006    Total Time spent with patient: 45 minutes  Subjective:   Amanda Hall is a 67 y.o. female patient does not warrant admission.  HPI:  67 yo female who presented to the ED with increase in anxiety.  She left [facility] this past Friday and went home where she lives with her sister.  Katara did not take her medications and became anxious, aggressive, and delusional.  She received medications and stabilized.  However, she appears to need more assistance with ADLs then anything else, social work consult placed.  Past Psychiatric History: depression, anxiety  Risk to Self: Suicidal Ideation: No Suicidal Intent: No Is patient at risk for suicide?: No Suicidal Plan?: No Access to Means: No What has been your use of drugs/alcohol within the last 12 months?: none How many times?: 0 Intentional Self Injurious Behavior: None Risk to Others: Homicidal Ideation: No Thoughts of Harm to Others: No Current Homicidal Intent: No Current Homicidal Plan: No Access to Homicidal Means: No History of harm to others?: No Assessment of Violence: None Noted Does patient have access to weapons?: No Criminal Charges Pending?: No Does patient  have a court date: No Prior Inpatient Therapy: Prior Inpatient Therapy: Yes Prior Therapy Dates: 05/2017 Prior Therapy Facilty/Provider(s): [facility] Reason for Treatment: Anxiety Prior Outpatient Therapy: Prior Outpatient Therapy: No Does patient have an ACCT team?: No Does patient have Intensive In-House Services?  : No Does patient have [facility] services? : No Does patient have P4CC services?: No  Past Medical History:  Past Medical History:  Diagnosis Date  . Allergy   . Anemia   . Anxiety   . Depression   . Hemorrhoids     Past Surgical History:  Procedure Laterality Date  . arm surgery     Left  . TUBAL LIGATION     Family History:  Family History  Problem Relation Age of Onset  . Stroke Mother   . Hypertension Sister   . Cancer Neg Hx        breat and colon hx  . Diabetes Neg Hx        family   Family Psychiatric  History: none Social History:  Social History   Substance and Sexual Activity  Alcohol Use No     Social History   Substance and Sexual Activity  Drug Use No    Social History   Socioeconomic History  . Marital status: Divorced    Spouse name: Not on file  . Number of children: Not on file  . Years of education: Not on file  . Highest education level: Not on file  Occupational History  . Not on file  Social Needs  . Financial resource strain: Not on file  . Food insecurity:    Worry: Not on file    Inability: Not on file  .  Transportation needs:    Medical: Not on file    Non-medical: Not on file  Tobacco Use  . Smoking status: Never Smoker  . Smokeless tobacco: Never Used  Substance and Sexual Activity  . Alcohol use: No  . Drug use: No  . Sexual activity: Not Currently  Lifestyle  . Physical activity:    Days per week: Not on file    Minutes per session: Not on file  . Stress: Not on file  Relationships  . Social connections:    Talks on phone: Not on file    Gets together: Not on file    Attends religious service: Not  on file    Active member of club or organization: Not on file    Attends meetings of clubs or organizations: Not on file    Relationship status: Not on file  Other Topics Concern  . Not on file  Social History Narrative  . Not on file   Additional Social History: N/A    Allergies:   Allergies  Allergen Reactions  . Codeine Other (See Comments)    "makes head fell crazy"  . Sulfonamide Derivatives Nausea Only  . Tetanus Toxoid Other (See Comments)    "Made a Knot"    Labs:  Results for orders placed or performed during the hospital encounter of 06/12/17 (from the past 48 hour(s))  Comprehensive metabolic panel     Status: Abnormal   Collection Time: 06/12/17  3:18 PM  Result Value Ref Range   Sodium 145 135 - 145 mmol/L   Potassium 3.1 (L) 3.5 - 5.1 mmol/L   Chloride 111 101 - 111 mmol/L   CO2 22 22 - 32 mmol/L   Glucose, Bld 129 (H) 65 - 99 mg/dL   BUN 45 (H) 6 - 20 mg/dL   Creatinine, Ser 1.45 (H) 0.44 - 1.00 mg/dL   Calcium 10.1 8.9 - 10.3 mg/dL   Total Protein 6.9 6.5 - 8.1 g/dL   Albumin 4.0 3.5 - 5.0 g/dL   AST 75 (H) 15 - 41 U/L   ALT 37 14 - 54 U/L   Alkaline Phosphatase 72 38 - 126 U/L   Total Bilirubin 0.5 0.3 - 1.2 mg/dL   GFR calc non Af Amer 37 (L) >60 mL/min   GFR calc Af Amer 43 (L) >60 mL/min    Comment: (NOTE) The eGFR has been calculated using the CKD EPI equation. This calculation has not been validated in all clinical situations. eGFR's persistently <60 mL/min signify possible Chronic Kidney Disease.    Anion gap 12 5 - 15    Comment: Performed at [facility], [place] [address], [place], [place] [address]  Ethanol     Status: None   Collection Time: 06/12/17  3:18 PM  Result Value Ref Range   Alcohol, Ethyl (B) <10 <10 mg/dL    Comment: (NOTE) Lowest detectable limit for serum alcohol is 10 mg/dL. For medical purposes only. Performed at [facility], [place] [address], [place], [place] [address]    Salicylate level     Status: None   Collection Time: 06/12/17  3:18 PM  Result Value Ref Range   Salicylate Lvl <6.8 2.8 - 30.0 mg/dL    Comment: Performed at [facility], [place] [address] [place], [place] [address]  Acetaminophen level     Status: Abnormal   Collection Time: 06/12/17  3:18 PM  Result Value Ref Range   Acetaminophen (Tylenol), Serum <10 (L)  10 - 30 ug/mL    Comment: (NOTE) Therapeutic concentrations vary significantly. A range of 10-30 ug/mL  may be an effective concentration for many patients. However, some  are best treated at concentrations outside of this range. Acetaminophen concentrations >150 ug/mL at 4 hours after ingestion  and >50 ug/mL at 12 hours after ingestion are often associated with  toxic reactions. Performed at [facility], [place] [address] [place], [place] [address]   cbc     Status: Abnormal   Collection Time: 06/12/17  3:18 PM  Result Value Ref Range   WBC 5.3 4.0 - 10.5 K/uL   RBC 3.48 (L) 3.87 - 5.11 MIL/uL   Hemoglobin 10.2 (L) 12.0 - 15.0 g/dL   HCT 30.5 (L) 36.0 - 46.0 %   MCV 87.6 78.0 - 100.0 fL   MCH 29.3 26.0 - 34.0 pg   MCHC 33.4 30.0 - 36.0 g/dL   RDW 13.6 11.5 - 15.5 %   Platelets 292 150 - 400 K/uL    Comment: Performed at [facility], [place] [address], [place], [place] [address]  Rapid urine drug screen (hospital performed)     Status: Abnormal   Collection Time: 06/13/17  7:00 AM  Result Value Ref Range   Opiates NONE DETECTED NONE DETECTED   Cocaine NONE DETECTED NONE DETECTED   Benzodiazepines POSITIVE (A) NONE DETECTED   Amphetamines NONE DETECTED NONE DETECTED   Tetrahydrocannabinol NONE DETECTED NONE DETECTED   Barbiturates NONE DETECTED NONE DETECTED    Comment: (NOTE) DRUG SCREEN FOR MEDICAL PURPOSES ONLY.  IF CONFIRMATION IS NEEDED FOR ANY PURPOSE, NOTIFY LAB WITHIN 5 DAYS. LOWEST DETECTABLE LIMITS FOR URINE DRUG SCREEN Drug Class                      Cutoff (ng/mL) Amphetamine and metabolites    1000 Barbiturate and metabolites    200 Benzodiazepine                 098 Tricyclics and metabolites     300 Opiates and metabolites        300 Cocaine and metabolites        300 THC                            50 Performed at [facility], [place] [address], [place], [place] [address]     No current facility-administered medications for this encounter.    Current Outpatient Medications  Medication Sig Dispense Refill  . hydrocortisone cream 1 % Apply to affected area 2 times daily 15 g 0  . polyethylene glycol (MIRALAX) packet Take 17 g by mouth 2 (two) times daily. 28 each 0  . fluvoxaMINE (LUVOX) 50 MG tablet Take 3 tablets (150 mg total) by mouth at bedtime. For mood control 90 tablet 0  . LORazepam (ATIVAN) 1 MG tablet Take 1 tablet (1 mg total) by mouth 2 (two) times daily. 60 tablet 3  . megestrol (MEGACE) 40 MG tablet Take 1 tablet (40 mg total) by mouth daily. 30 tablet 0  . mirtazapine (REMERON) 45 MG tablet Take 1 tablet (45 mg total) by mouth at bedtime. For mood control 30 tablet 0  . OXcarbazepine (TRILEPTAL) 150 MG tablet Take 1 tablet (150 mg total) by mouth at bedtime. For mood stability 30 tablet 0  . OXcarbazepine (TRILEPTAL) 150 MG tablet Take 0.5 tablets (75 mg total) by mouth  2 (two) times daily. Mood control 30 tablet 0  . QUEtiapine (SEROQUEL) 50 MG tablet Take 1 tablet (50 mg total) by mouth at bedtime. For mood control 30 tablet 0    Musculoskeletal: Strength & Muscle Tone: within normal limits Gait & Station: normal Patient leans: N/A  Psychiatric Specialty Exam: Physical Exam  Nursing note and vitals reviewed. Constitutional: She is oriented to person, place, and time. She appears well-developed and well-nourished.  HENT:  Head: Normocephalic and atraumatic.  Neck: Normal range of motion.  Cardiovascular: Normal rate.  Respiratory: Effort normal.  Musculoskeletal: Normal range of  motion.  Neurological: She is alert and oriented to person, place, and time.  Psychiatric: Her speech is normal and behavior is normal. Judgment and thought content normal. Her mood appears anxious. Cognition and memory are normal.    Review of Systems  Psychiatric/Behavioral: The patient is nervous/anxious.   All other systems reviewed and are negative.   Blood pressure 139/81, pulse 97, temperature 98.3 F (36.8 C), temperature source Axillary, resp. rate 20, SpO2 98 %.There is no height or weight on file to calculate BMI.  General Appearance: Casual  Eye Contact:  Good  Speech:  Normal Rate  Volume:  Normal  Mood:  Anxious  Affect:  Congruent  Thought Process:  Coherent and Descriptions of Associations: Intact  Orientation:  Full (Time, Place, and Person)  Thought Content:  WDL and Logical  Suicidal Thoughts:  No  Homicidal Thoughts:  No  Memory:  Immediate;   Good Recent;   Good Remote;   Good  Judgement:  Fair  Insight:  Fair  Psychomotor Activity:  Normal  Concentration:  Concentration: Good and Attention Span: Good  Recall:  Good  Fund of Knowledge:  Fair  Language:  Good  Akathisia:  No  Handed:  Right  AIMS (if indicated):   N/A  Assets:  Leisure Time Physical Health Resilience Social Support  ADL's:  Intact  Cognition:  WNL  Sleep:   N/A     Treatment Plan Summary: General anxiety d/o: -Continued Luvox 150 mg daily for anxiety and depression -Continue Trileptal 150 mg qhs for mood stabilization. -Ativan 1 mg BID for anxiety  Mood d/o: -Started Seroquel 50 mg for mood stabilization -Trileptal 75 mg BID and 150 mg at bedtime for mood stabilization  Insomnia -Restarted Remeron 45 mg at bedtime for sleep and appetite   Disposition: No evidence of imminent risk to self or others at present.    Waylan Boga, NP 06/13/2017 10:41 AM   Patient seen face-to-face for psychiatric evaluation, chart reviewed and case discussed with the physician extender and  developed treatment plan. Reviewed the information documented and agree with the treatment plan.  Buford Dresser, DO 06/13/17 4:43 PM

## 2017-06-13 NOTE — Progress Notes (Addendum)
2nd shift ED CSW received handoff from 1st shift ED CSW.  CSW called pt's sister Velna HatchetSheila at ph: [phone].  Pt's sister reiterated the ADL's that the pt has difficulty with such as bathing or cleaning herself.  Pt's sister stated that pt refuses HH services due to the pt being fearful of people in the house who the pt does not know.    Pt's sister is stating she is fearful that if she takes pt home that pt will again refuse to take medication.    7:28 PM  CSW counseled pt's sister on SNF's, ALF's and Memory Care, how they work, who is qualified to go there, insurances that do and don't pay for them and how it is paid for otherwise.  CSW will provide pt's sister with a list of Assisted Living Facilities (will give it to pt's RN at D/C) in the [place] area and counseled pt on how ALF's operate with memory care units.  CSW offered to request assistance from pt's CM [facility], but pt's sister stated pt would not allow those she does not know to be in the home with the pt.  Pt's sister was appreciative and thanked the CSW.  Pt's sister is now agreeable to p/u the pt at approx 8:30 and will call the CSW when pt's sister is en route and again when pt's sister arrives.  Pt's sister asks that pt be brought out to her car at the ED entrance as the pt's sister has difficulty ambulating and pt's brother is driving the vehicle.  Please reconsult if future social work needs arise.  CSW signing off, as social work intervention is no longer needed.  Dorothe PeaJonathan F. Tisha Cline, LCSW, LCAS, CSI Clinical Social Worker Ph: [phone]

## 2017-06-13 NOTE — BHH Counselor (Signed)
Per Dr. Sharma CovertNorman and Nanine MeansJamison Lord, DNP, patient evaluated and does not meet criteria for psychiatric stabilization. Patient discharged from psych. Referred to LCSW for placement (ALF, nursing home, etc.). Counselor contacted LCSW to provide details on patient's placement needs.

## 2017-06-13 NOTE — ED Provider Notes (Signed)
Patient was deemed stable for discharge by psychiatry.  However the patient is having diffuse shaking of her bilateral upper extremities and is avoiding eye contact.  She is not talking much to me and is refusing all p.o. meds.  She is on Ativan and this is not been given to her since she arrived last night as well as multiple other medicines.  Upon reviewing her chart, she has a mild BUN/creatinine increase compared to baseline as well as a probable UTI.  I will give her Rocephin, fluids, IV Ativan and re-eval.  She is awake and alert and does not appear to be seizing but is avoiding contact and appears depressed/anxious.  At this point, she does not appear stable for discharge home.  The patient's lab work is essentially unchanged except for improvement in creatinine.  She was given a bolus of fluids and IV Rocephin.  Now that this is done, she did take oral medicines.  However it was quite difficult for the nurse to convince her to do this and it took a while.  I think this is mostly an anxiety/psychiatric issue.  While there does appear to be a UTI, I do not think this is necessarily a medical cause of her symptoms.  I think this needs to be treated given the limited history, but think that this is primarily psychiatric.  I think psychiatry needs to reevaluate her again in the morning and a tele-psych has been ordered.   [name], Amanda Dimalanta, MD 06/13/17 [phone]

## 2017-06-13 NOTE — Progress Notes (Addendum)
CSW spoke to pt's RN who stated pt is being assessed by the EDP at this time.  CSW spoke to the EDP who is awaiting lab work but states it is possible pt has a UTI and also due to pt not eating or drinking, the EDP is assessing further. Per EDP, pt will likely be kept overnight if not admitted to inpatient.  CSW spoke to pt's sister Sheran SpineSheila Borel at ph: [phone] and informed pt's sister of the above.  Pt's sister voiced understanding and was appreciative of and thanked the CSW.  Pt's sister asked that she be updated ASAP in the morning.  CSW updated pt's RN  2nd shift ED CSW will leave handoff for 1st shift ED CSW.  Dorothe PeaJonathan F. Jules Baty, LCSW, LCAS, CSI Clinical Social Worker Ph: [phone]

## 2017-06-13 NOTE — Evaluation (Signed)
Physical Therapy Evaluation Patient Details Name: Amanda Hall MRN: [id number] DOB: 1950/10/04 Today's Date: 06/13/2017   History of Present Illness  67 y.o. female hx depression, recent d/c from behavioral health facility, from home with refusing to take meds for past few days, and agitated/aggressive behavior and delusional thoughts  Clinical Impression  Pt ambulated 180' with hand held assistance, no loss of balance. She reports she uses a cane or a RW at baseline. She appears to be near baseline of modified independence with mobility, no further PT indicated. Will sign off.      Follow Up Recommendations No PT follow up    Equipment Recommendations  None recommended by PT    Recommendations for Other Services       Precautions / Restrictions Precautions Precautions: None Precaution Comments: pt denies falls in past 1 year Restrictions Weight Bearing Restrictions: No      Mobility  Bed Mobility Overal bed mobility: Modified Independent             General bed mobility comments: with rail, increased time  Transfers Overall transfer level: Independent Equipment used: None                Ambulation/Gait Ambulation/Gait assistance: Min guard Ambulation Distance (Feet): 180 Feet Assistive device: 1 person hand held assist Gait Pattern/deviations: Step-through pattern;Decreased stride length Gait velocity: WFL   General Gait Details: steady, no loss of balance  Stairs            Wheelchair Mobility    Modified Rankin (Stroke Patients Only)       Balance Overall balance assessment: Modified Independent                                           Pertinent Vitals/Pain Pain Assessment: Faces Faces Pain Scale: Hurts little more Pain Location: back Pain Descriptors / Indicators: Aching Pain Intervention(s): Limited activity within patient's tolerance;Monitored during session    Home Living Family/patient expects to be  discharged to:: Private residence Living Arrangements: Other relatives Available Help at Discharge: Family Type of Home: House Home Access: Stairs to enter   Secretary/administrator of Steps: 1 Home Layout: One level Home Equipment: Environmental consultant - 2 wheels;Cane - single point      Prior Function Level of Independence: Independent with assistive device(s)         Comments: pt reports she sometimes uses a RW or cane, was independent with ADLs     Hand Dominance   Dominant Hand: Right    Extremity/Trunk Assessment   Upper Extremity Assessment Upper Extremity Assessment: Overall WFL for tasks assessed    Lower Extremity Assessment Lower Extremity Assessment: Overall WFL for tasks assessed    Cervical / Trunk Assessment Cervical / Trunk Assessment: Kyphotic  Communication   Communication: No difficulties  Cognition Arousal/Alertness: Awake/alert Behavior During Therapy: WFL for tasks assessed/performed(teary, quiet) Overall Cognitive Status: No family/caregiver present to determine baseline cognitive functioning                                 General Comments: pt oriented, somewhat withdrawn and teary during PT session      General Comments      Exercises     Assessment/Plan    PT Assessment Patent does not need any further PT services  PT Problem List  PT Treatment Interventions      PT Goals (Current goals can be found in the Care Plan section)  Acute Rehab PT Goals PT Goal Formulation: All assessment and education complete, DC therapy    Frequency     Barriers to discharge        Co-evaluation               AM-PAC PT "6 Clicks" Daily Activity  Outcome Measure Difficulty turning over in bed (including adjusting bedclothes, sheets and blankets)?: A Little Difficulty moving from lying on back to sitting on the side of the bed? : A Little Difficulty sitting down on and standing up from a chair with arms (e.g., wheelchair,  bedside commode, etc,.)?: A Little Help needed moving to and from a bed to chair (including a wheelchair)?: A Little Help needed walking in hospital room?: A Little Help needed climbing 3-5 steps with a railing? : A Little 6 Click Score: 18    End of Session Equipment Utilized During Treatment: Gait belt Activity Tolerance: Patient tolerated treatment well Patient left: in bed;with call bell/phone within reach;with nursing/sitter in room Nurse Communication: Mobility status      Time: [id number] PT Time Calculation (min) (ACUTE ONLY): 14 min   Charges:   PT Evaluation $PT Eval Low Complexity: 1 Low     PT G CodesTamala Hall:          Amanda Hall 06/13/2017, 3:01 PM [id number]

## 2017-06-13 NOTE — ED Notes (Addendum)
After 30 to 45 minutes and pleading for patient to take her home medications, she finally took them one by one with sips of ice water. She kept trying to tell me something but was unable to articulate it. She kept saying over and over, "it will seem like" and she never could finish her statement. She also stated something about a payment. She appeared very frightened and was unable to articulate what she was frightened about. She also appears very depressed and tearful. Notified MD. Continue to monitor.

## 2017-06-13 NOTE — ED Notes (Signed)
ED Provider at bedside. 

## 2017-06-13 NOTE — ED Notes (Signed)
Report gave to RN

## 2017-06-13 NOTE — Progress Notes (Signed)
CSW updated pt's RN who stated pt presented as anxious AEB the pt shaking and not responding to inquiries or statements.  CSW spoke to pt who was unable to answer CSW's questions or respond to CSW's news that pt's sister would be en route shortly to p/u the pt.  CSW spoke to [employer]PD who reviewed pt's chart and insured pt was receiving pt's needed meds and who would meet with the pt once the pt had received the pt's meds and [facility]  CSW will continue to follow for D/C needs.  Dorothe PeaJonathan F. Shelley Pooley, LCSW, LCAS, CSI Clinical Social Worker Ph: [phone]

## 2017-06-13 NOTE — Clinical Social Work Note (Signed)
Clinical Social Work Assessment  Patient Details  Name: Coolidge BreezeDana Jaime MRN: [id number] Date of Birth: 18-Sep-1950  Date of referral:  06/13/17               Reason for consult:  Facility Placement                Permission sought to share information with:  Family Supports Permission granted to share information::  Yes, Release of Information Signed  Name::     Kris MoutonShelia Raybon   Agency::  family  Relationship::   sister   Contact Information:  Kris MoutonShelia Robben [phone]  Housing/Transportation Living arrangements for the past 2 months:  Single Family Home(with sister. ) Source of Information:  Siblings Patient Interpreter Needed:  None Criminal Activity/Legal Involvement Pertinent to Current Situation/Hospitalization:  No - Comment as needed Significant Relationships:  Significant Other Lives with:  Siblings Do you feel safe going back to the place where you live?  Yes Need for family participation in patient care:  Yes (Comment)  Care giving concerns:  CSW spoke with pt's sister Velna HatchetSheila concerning needs of pt. CSW was informed that family is having trouble with getting pt to take medications as needed as well as to eat. Velna HatchetSheila expressed that she and brother are able to care for pt, however when pt doesn't take medication then it becomes a challenge.    Social Worker assessment / plan:  CSW spoke with pt's sister for assessment. CSW was informed that pt is from home with sister and that sister helps take care of pt a needed. Pt's sister reports that pt has not been eating or taking medications as instructed and this makes it very difficult for pt's sister to care for pt. Sister expressed to CSW that they are not interested in having pt placed into a SNF but think that pt needs psych placement. CSW advised sister that pt had been cleared however sister continuously expressed that pt needed psych placement instead of ALF or SNF placement. CSW expressed to sister that since family is not wanting SNF  placement and ALF may take awhile then CSW would provide family with ALDF  resources in the event that they choose to look into ALF later after admission. During this assessment sister was very nice and understanding of pt's needs.   Employment status:  Retired Health and safety inspectornsurance information:  Medicare PT Recommendations:  Not assessed at this time Information / Referral to community resources:  Skilled Nursing Facility(spoke about SNF placemetnt options. )  Patient/Family's Response to care:  Pt's sister response to care was understanding and still suggesting that pt needs psych placement as opposed to SNF/ALF placement. Sister expressed that she has no problem caring for pt as she was once an Charity fundraiserN however pt not taking medications has been the challenge in caring for pt.   Patient/Family's Understanding of and Emotional Response to Diagnosis, Current Treatment, and Prognosis:  No further questions or concerns have been presented to CSW at this time. Plan is for sister to speak with Psych staff to express concerns about pt getting psych placement.   Emotional Assessment Appearance:  Appears stated age Attitude/Demeanor/Rapport:  Unable to Assess Affect (typically observed):  Unable to Assess Orientation:    Alcohol / Substance use:  Not Applicable Psych involvement (Current and /or in the community):  No (Comment)(pt has been cleared by psych.)  Discharge Needs  Concerns to be addressed:  Mental Health Concerns, Medication Concerns Readmission within the last 30 days:  Yes Current  discharge risk:  None Barriers to Discharge:  Continued Medical Work up   [employer], LCSWA 06/13/2017, 1:09 PM

## 2017-06-14 DIAGNOSIS — F411 Generalized anxiety disorder: Secondary | ICD-10-CM | POA: Diagnosis not present

## 2017-06-14 DIAGNOSIS — G4700 Insomnia, unspecified: Secondary | ICD-10-CM | POA: Diagnosis not present

## 2017-06-14 LAB — URINE CULTURE

## 2017-06-14 MED ORDER — CEPHALEXIN 500 MG PO CAPS: 500.0000 mg | ORAL_CAPSULE | Freq: Three times a day (TID) | ORAL | 0 refills | Status: DC

## 2017-06-14 NOTE — Consult Note (Addendum)
[facility] Consult    Reason for Consult:  Anxiety, delusions Referring Physician:  EDP Patient Identification: Amanda Hall MRN:  [id number] Principal Diagnosis: Generalized anxiety disorder Diagnosis:   Patient Active Problem List   Diagnosis Date Noted  . Generalized anxiety disorder [F41.1] 05/26/2017    Priority: High  . Major depressive disorder, recurrent severe without psychotic features ([place]) [F33.2] 05/26/2017  . Hypercholesterolemia [E78.00] 03/22/2017  . Nonspecific (abnormal) findings on radiological and other examination of body structure [793] 12/26/2006  . NONSPECIFIC ABNORM FIND RAD&OTH EXAM LUNG FIELD [R93.0] 12/26/2006  . ANEMIA-IRON DEFICIENCY [D50.9] 12/15/2006  . Depression [F32.9] 12/15/2006  . Allergic rhinitis [J30.9] 12/15/2006    Total Time spent with patient: 45 minutes  Subjective:   Jamielee Mchale is a 67 y.o. female patient does not warrant admission.  HPI:  67 yo female who presented to the ED with increase in anxiety.  She left [facility] this past Friday and went home where she lives with her sister.  Tahja did not take her medications and became anxious, aggressive, and delusional.  She received medications and stabilized.  She does have a UTI that did not clear and was treated via antibiotic yesterday.  When she was at [facility], they had scheduled her to discharge to an assisted living but her family refused.  However, she appears to need more assistance with ADLs then anything else, social work consult placed.  Past Psychiatric History: depression, anxiety  Risk to Self: Suicidal Ideation: No Suicidal Intent: No Is patient at risk for suicide?: No Suicidal Plan?: No Access to Means: No What has been your use of drugs/alcohol within the last 12 months?: none How many times?: 0 Intentional Self Injurious Behavior: None Risk to Others: Homicidal Ideation: No Thoughts of Harm to Others: No Current Homicidal Intent: No Current Homicidal Plan:  No Access to Homicidal Means: No History of harm to others?: No Assessment of Violence: None Noted Does patient have access to weapons?: No Criminal Charges Pending?: No Does patient have a court date: No Prior Inpatient Therapy: Prior Inpatient Therapy: Yes Prior Therapy Dates: 05/2017 Prior Therapy Facilty/Provider(s): [facility] Reason for Treatment: Anxiety Prior Outpatient Therapy: Prior Outpatient Therapy: No Does patient have an ACCT team?: No Does patient have Intensive In-House Services?  : No Does patient have [facility] services? : No Does patient have P4CC services?: No  Past Medical History:  Past Medical History:  Diagnosis Date  . Allergy   . Anemia   . Anxiety   . Depression   . Hemorrhoids     Past Surgical History:  Procedure Laterality Date  . arm surgery     Left  . TUBAL LIGATION     Family History:  Family History  Problem Relation Age of Onset  . Stroke Mother   . Hypertension Sister   . Cancer Neg Hx        breat and colon hx  . Diabetes Neg Hx        family   Family Psychiatric  History: none Social History:  Social History   Substance and Sexual Activity  Alcohol Use No     Social History   Substance and Sexual Activity  Drug Use No    Social History   Socioeconomic History  . Marital status: Divorced    Spouse name: Not on file  . Number of children: Not on file  . Years of education: Not on file  . Highest education level: Not on file  Occupational  History  . Not on file  Social Needs  . Financial resource strain: Not on file  . Food insecurity:    Worry: Not on file    Inability: Not on file  . Transportation needs:    Medical: Not on file    Non-medical: Not on file  Tobacco Use  . Smoking status: Never Smoker  . Smokeless tobacco: Never Used  Substance and Sexual Activity  . Alcohol use: No  . Drug use: No  . Sexual activity: Not Currently  Lifestyle  . Physical activity:    Days per week: Not on file     Minutes per session: Not on file  . Stress: Not on file  Relationships  . Social connections:    Talks on phone: Not on file    Gets together: Not on file    Attends religious service: Not on file    Active member of club or organization: Not on file    Attends meetings of clubs or organizations: Not on file    Relationship status: Not on file  Other Topics Concern  . Not on file  Social History Narrative  . Not on file   Additional Social History: N/A    Allergies:   Allergies  Allergen Reactions  . Codeine Other (See Comments)    "makes head fell crazy"  . Sulfonamide Derivatives Nausea Only  . Tetanus Toxoid Other (See Comments)    "Made a Knot"    Labs:  Results for orders placed or performed during the hospital encounter of 06/12/17 (from the past 48 hour(s))  Comprehensive metabolic panel     Status: Abnormal   Collection Time: 06/12/17  3:18 PM  Result Value Ref Range   Sodium 145 135 - 145 mmol/L   Potassium 3.1 (L) 3.5 - 5.1 mmol/L   Chloride 111 101 - 111 mmol/L   CO2 22 22 - 32 mmol/L   Glucose, Bld 129 (H) 65 - 99 mg/dL   BUN 45 (H) 6 - 20 mg/dL   Creatinine, Ser 1.45 (H) 0.44 - 1.00 mg/dL   Calcium 10.1 8.9 - 10.3 mg/dL   Total Protein 6.9 6.5 - 8.1 g/dL   Albumin 4.0 3.5 - 5.0 g/dL   AST 75 (H) 15 - 41 U/L   ALT 37 14 - 54 U/L   Alkaline Phosphatase 72 38 - 126 U/L   Total Bilirubin 0.5 0.3 - 1.2 mg/dL   GFR calc non Af Amer 37 (L) >60 mL/min   GFR calc Af Amer 43 (L) >60 mL/min    Comment: (NOTE) The eGFR has been calculated using the CKD EPI equation. This calculation has not been validated in all clinical situations. eGFR's persistently <60 mL/min signify possible Chronic Kidney Disease.    Anion gap 12 5 - 15    Comment: Performed at [facility], [place] [address], [place], [place] [address]  Ethanol     Status: None   Collection Time: 06/12/17  3:18 PM  Result Value Ref Range   Alcohol, Ethyl (B) <10 <10 mg/dL     Comment: (NOTE) Lowest detectable limit for serum alcohol is 10 mg/dL. For medical purposes only. Performed at [facility], [place] [address], [place], [place] [address]   Salicylate level     Status: None   Collection Time: 06/12/17  3:18 PM  Result Value Ref Range   Salicylate Lvl <0.9 2.8 - 30.0 mg/dL    Comment: Performed at [facility], [place] [address], [place], [place] [address]  Acetaminophen level     Status: Abnormal   Collection Time: 06/12/17  3:18 PM  Result Value Ref Range   Acetaminophen (Tylenol), Serum <10 (L) 10 - 30 ug/mL    Comment: (NOTE) Therapeutic concentrations vary significantly. A range of 10-30 ug/mL  may be an effective concentration for many patients. However, some  are best treated at concentrations outside of this range. Acetaminophen concentrations >150 ug/mL at 4 hours after ingestion  and >50 ug/mL at 12 hours after ingestion are often associated with  toxic reactions. Performed at [facility], [place] [address], [place], [place] [address]   cbc     Status: Abnormal   Collection Time: 06/12/17  3:18 PM  Result Value Ref Range   WBC 5.3 4.0 - 10.5 K/uL   RBC 3.48 (L) 3.87 - 5.11 MIL/uL   Hemoglobin 10.2 (L) 12.0 - 15.0 g/dL   HCT 30.5 (L) 36.0 - 46.0 %   MCV 87.6 78.0 - 100.0 fL   MCH 29.3 26.0 - 34.0 pg   MCHC 33.4 30.0 - 36.0 g/dL   RDW 13.6 11.5 - 15.5 %   Platelets 292 150 - 400 K/uL    Comment: Performed at [facility], [place] [address] [place], [place] [address]  Rapid urine drug screen (hospital performed)     Status: Abnormal   Collection Time: 06/13/17  7:00 AM  Result Value Ref Range   Opiates NONE DETECTED NONE DETECTED   Cocaine NONE DETECTED NONE DETECTED   Benzodiazepines POSITIVE (A) NONE DETECTED   Amphetamines NONE DETECTED NONE DETECTED   Tetrahydrocannabinol NONE DETECTED NONE DETECTED   Barbiturates NONE DETECTED NONE DETECTED    Comment:  (NOTE) DRUG SCREEN FOR MEDICAL PURPOSES ONLY.  IF CONFIRMATION IS NEEDED FOR ANY PURPOSE, NOTIFY LAB WITHIN 5 DAYS. LOWEST DETECTABLE LIMITS FOR URINE DRUG SCREEN Drug Class                     Cutoff (ng/mL) Amphetamine and metabolites    1000 Barbiturate and metabolites    200 Benzodiazepine                 329 Tricyclics and metabolites     300 Opiates and metabolites        300 Cocaine and metabolites        300 THC                            50 Performed at [facility], [place] [address], [place], [place] [address]   Urinalysis, Routine w reflex microscopic     Status: Abnormal   Collection Time: 06/13/17  9:09 AM  Result Value Ref Range   Color, Urine YELLOW YELLOW   APPearance CLEAR CLEAR   Specific Gravity, Urine 1.017 1.005 - 1.030   pH 5.0 5.0 - 8.0   Glucose, UA 50 (A) NEGATIVE mg/dL   Hgb urine dipstick LARGE (A) NEGATIVE   Bilirubin Urine NEGATIVE NEGATIVE   Ketones, ur 20 (A) NEGATIVE mg/dL   Protein, ur NEGATIVE NEGATIVE mg/dL   Nitrite NEGATIVE NEGATIVE   Leukocytes, UA MODERATE (A) NEGATIVE   RBC / HPF 21-50 0 - 5 RBC/hpf   WBC, UA >50 (H) 0 - 5 WBC/hpf   Bacteria, UA NONE SEEN NONE SEEN   Squamous Epithelial / LPF 0-5 0 - 5    Comment:  Performed at [facility], [place] [address] [place], [place] [address]  CBC with Differential/Platelet     Status: Abnormal   Collection Time: 06/13/17  8:24 PM  Result Value Ref Range   WBC 7.1 4.0 - 10.5 K/uL   RBC 4.01 3.87 - 5.11 MIL/uL   Hemoglobin 12.1 12.0 - 15.0 g/dL   HCT 35.7 (L) 36.0 - 46.0 %   MCV 89.0 78.0 - 100.0 fL   MCH 30.2 26.0 - 34.0 pg   MCHC 33.9 30.0 - 36.0 g/dL   RDW 13.9 11.5 - 15.5 %   Platelets 372 150 - 400 K/uL   Neutrophils Relative % 71 %   Neutro Abs 5.0 1.7 - 7.7 K/uL   Lymphocytes Relative 21 %   Lymphs Abs 1.5 0.7 - 4.0 K/uL   Monocytes Relative 8 %   Monocytes Absolute 0.5 0.1 - 1.0 K/uL   Eosinophils Relative 0 %   Eosinophils Absolute 0.0  0.0 - 0.7 K/uL   Basophils Relative 0 %   Basophils Absolute 0.0 0.0 - 0.1 K/uL    Comment: Performed at [facility], [place] [address], [place], [place] [address]  Basic metabolic panel     Status: Abnormal   Collection Time: 06/13/17  8:24 PM  Result Value Ref Range   Sodium 144 135 - 145 mmol/L   Potassium 4.7 3.5 - 5.1 mmol/L    Comment: DELTA CHECK NOTED REPEATED TO VERIFY NO VISIBLE HEMOLYSIS    Chloride 108 101 - 111 mmol/L   CO2 23 22 - 32 mmol/L   Glucose, Bld 123 (H) 65 - 99 mg/dL   BUN 30 (H) 6 - 20 mg/dL   Creatinine, Ser 1.11 (H) 0.44 - 1.00 mg/dL   Calcium 9.8 8.9 - 10.3 mg/dL   GFR calc non Af Amer 51 (L) >60 mL/min   GFR calc Af Amer 59 (L) >60 mL/min    Comment: (NOTE) The eGFR has been calculated using the CKD EPI equation. This calculation has not been validated in all clinical situations. eGFR's persistently <60 mL/min signify possible Chronic Kidney Disease.    Anion gap 13 5 - 15    Comment: Performed at [facility], [place] [address], [place], [place] [address]    Current Facility-Administered Medications  Medication Dose Route Frequency Provider Last Rate Last Dose  . cephALEXin (KEFLEX) capsule 500 mg  500 mg Oral Q12H Sherwood Gambler, MD      . fluvoxaMINE (LUVOX) tablet 150 mg  150 mg Oral QHS Sherwood Gambler, MD   150 mg at 06/13/17 2313  . LORazepam (ATIVAN) tablet 1 mg  1 mg Oral Once Sherwood Gambler, MD      . LORazepam (ATIVAN) tablet 1 mg  1 mg Oral BID Sherwood Gambler, MD   1 mg at 06/13/17 2336  . megestrol (MEGACE) tablet 40 mg  40 mg Oral Daily Sherwood Gambler, MD      . mirtazapine (REMERON) tablet 45 mg  45 mg Oral QHS Sherwood Gambler, MD   45 mg at 06/13/17 2315  . OXcarbazepine (TRILEPTAL) tablet 150 mg  150 mg Oral QHS Sherwood Gambler, MD   150 mg at 06/13/17 2314  . OXcarbazepine (TRILEPTAL) tablet 75 mg  75 mg Oral BID WC Sherwood Gambler, MD      . QUEtiapine (SEROQUEL) tablet 50 mg  50 mg Oral QHS  Sherwood Gambler, MD   50 mg at 06/13/17 2314   Current Outpatient Medications  Medication Sig Dispense Refill  . hydrocortisone cream 1 % Apply to affected area 2 times daily 15 g 0  . polyethylene glycol (MIRALAX) packet Take 17 g by mouth 2 (two) times daily. 28 each 0  . fluvoxaMINE (LUVOX) 50 MG tablet Take 3 tablets (150 mg total) by mouth at bedtime. For mood control 90 tablet 0  . LORazepam (ATIVAN) 1 MG tablet Take 1 tablet (1 mg total) by mouth 2 (two) times daily. 60 tablet 3  . megestrol (MEGACE) 40 MG tablet Take 1 tablet (40 mg total) by mouth daily. 30 tablet 0  . mirtazapine (REMERON) 45 MG tablet Take 1 tablet (45 mg total) by mouth at bedtime. For mood control 30 tablet 0  . OXcarbazepine (TRILEPTAL) 150 MG tablet Take 1 tablet (150 mg total) by mouth at bedtime. For mood stability 30 tablet 0  . OXcarbazepine (TRILEPTAL) 150 MG tablet Take 0.5 tablets (75 mg total) by mouth 2 (two) times daily. Mood control 30 tablet 0  . QUEtiapine (SEROQUEL) 50 MG tablet Take 1 tablet (50 mg total) by mouth at bedtime. For mood control 30 tablet 0    Musculoskeletal: Strength & Muscle Tone: within normal limits Gait & Station: normal Patient leans: N/A  Psychiatric Specialty Exam: Physical Exam  Nursing note and vitals reviewed. Constitutional: She is oriented to person, place, and time. She appears well-developed and well-nourished.  HENT:  Head: Normocephalic and atraumatic.  Neck: Normal range of motion.  Cardiovascular: Normal rate.  Respiratory: Effort normal.  Musculoskeletal: Normal range of motion.  Neurological: She is alert and oriented to person, place, and time.  Psychiatric: Her speech is normal and behavior is normal. Judgment and thought content normal. Her mood appears anxious. Cognition and memory are normal.    Review of Systems  Psychiatric/Behavioral: The patient is nervous/anxious.   All other systems reviewed and are negative.   Blood pressure 139/81,  pulse 83, temperature 98.7 F (37.1 C), temperature source Oral, resp. rate 16, SpO2 97 %.There is no height or weight on file to calculate BMI.  General Appearance: Casual  Eye Contact:  Good  Speech:  Normal Rate  Volume:  Normal  Mood:  Anxious  Affect:  Congruent  Thought Process:  Coherent and Descriptions of Associations: Intact  Orientation:  Full (Time, Place, and Person)  Thought Content:  WDL and Logical  Suicidal Thoughts:  No  Homicidal Thoughts:  No  Memory:  Immediate;   Good Recent;   Good Remote;   Good  Judgement:  Fair  Insight:  Fair  Psychomotor Activity:  Normal  Concentration:  Concentration: Good and Attention Span: Good  Recall:  Good  Fund of Knowledge:  Fair  Language:  Good  Akathisia:  No  Handed:  Right  AIMS (if indicated):   N/A  Assets:  Leisure Time Physical Health Resilience Social Support  ADL's:  Intact  Cognition:  WNL  Sleep:   N/A     Treatment Plan Summary: General anxiety d/o: -Continued Luvox 150 mg daily for anxiety and depression -Continue Trileptal 150 mg qhs for mood stabilization. -Ativan 1 mg BID for anxiety  Mood d/o: -Started Seroquel 50 mg for mood stabilization -Trileptal 75 mg BID and 150 mg at bedtime for mood stabilization  Insomnia -Restarted Remeron 45 mg at bedtime for sleep and appetite   Disposition: No evidence of imminent risk to self or others at present.    Waylan Boga, NP 06/14/2017 9:16 AM    Patient seen  face-to-face for psychiatric evaluation, chart reviewed and case discussed with the physician extender and developed treatment plan. Reviewed the information documented and agree with the treatment plan.  Buford Dresser, DO 06/14/17 11:57 AM

## 2017-06-14 NOTE — [facility] Suicide Risk Assessment (Signed)
Suicide Risk Assessment  Discharge Assessment   [facility]   Principal Problem: Generalized anxiety disorder Discharge Diagnoses:  Patient Active Problem List   Diagnosis Date Noted  . Generalized anxiety disorder [F41.1] 05/26/2017    Priority: High  . Major depressive disorder, recurrent severe without psychotic features ([facility]) [F33.2] 05/26/2017  . Hypercholesterolemia [E78.00] 03/22/2017  . Nonspecific (abnormal) findings on radiological and other examination of body structure [793] 12/26/2006  . NONSPECIFIC ABNORM FIND RAD&OTH EXAM LUNG FIELD [R93.0] 12/26/2006  . ANEMIA-IRON DEFICIENCY [D50.9] 12/15/2006  . Depression [F32.9] 12/15/2006  . Allergic rhinitis [J30.9] 12/15/2006    Total Time spent with patient: 45 minutes  Musculoskeletal: Strength & Muscle Tone: within normal limits Gait & Station: normal Patient leans: N/A  Psychiatric Specialty Exam: Physical Exam  Nursing note and vitals reviewed. Constitutional: She is oriented to person, place, and time. She appears well-developed and well-nourished.  HENT:  Head: Normocephalic.  Cardiovascular: Normal rate.  Respiratory: Effort normal.  Musculoskeletal: Normal range of motion.  Neurological: She is alert and oriented to person, place, and time.  Psychiatric: Her speech is normal and behavior is normal. Judgment and thought content normal. Her mood appears anxious. Cognition and memory are normal.    Review of Systems  Psychiatric/Behavioral: The patient is nervous/anxious.   All other systems reviewed and are negative.   Blood pressure 139/81, pulse 97, temperature 98.3 F (36.8 C), temperature source Axillary, resp. rate 20, SpO2 98 %.There is no height or weight on file to calculate BMI.  General Appearance: Casual  Eye Contact:  Good  Speech:  Normal Rate  Volume:  Normal  Mood:  Anxious  Affect:  Congruent  Thought Process:  Coherent and Descriptions of Associations: Intact   Orientation:  Full (Time, Place, and Person)  Thought Content:  WDL and Logical  Suicidal Thoughts:  No  Homicidal Thoughts:  No  Memory:  Immediate;   Good Recent;   Good Remote;   Good  Judgement:  Fair  Insight:  Fair  Psychomotor Activity:  Normal  Concentration:  Concentration: Good and Attention Span: Good  Recall:  Good  Fund of Knowledge:  Fair  Language:  Good  Akathisia:  No  Handed:  Right  AIMS (if indicated):     Assets:  Leisure Time Physical Health Resilience Social Support  ADL's:  Intact  Cognition:  WNL  Sleep:       Mental Status Per Nursing Assessment::   On Admission:   anxiety  Demographic Factors:  Age 67 or older and Caucasian  Loss Factors: NA  Historical Factors: NA  Risk Reduction Factors:   Sense of responsibility to family, Living with another person, especially a relative and Positive social support  Continued Clinical Symptoms:  Anxiety, mild  Cognitive Features That Contribute To Risk:  None    Suicide Risk:  Minimal: No identifiable suicidal ideation.  Patients presenting with no risk factors but with morbid ruminations; may be classified as minimal risk based on the severity of the depressive symptoms    Plan Of Care/Follow-up recommendations:  Activity:  as tolerated Diet:  heart healthy diet  Jacqulynn Shappell, NP 06/14/2017, 9:20 AM

## 2017-06-14 NOTE — Evaluation (Signed)
Occupational Therapy Evaluation Patient Details Name: Amanda Hall MRN: [id number] DOB: 11/06/1950 Today's Date: 06/14/2017    History of Present Illness 67 y.o. female hx depression, recent d/c from behavioral health facility, from home with refusing to take meds for past few days, and agitated/aggressive behavior and delusional thoughts   Clinical Impression   Pt was seen for initial evaluation.  Yesterday, PT saw her and she was much more independent with mobility.  She was slow to respond and needed min A for mobility with 3 LOB and max A for LB adls. Will follow in acute setting     Follow Up Recommendations  Supervision/Assistance - 24 hour(if another service present, HHOT)   Equipment Recommendations  (may need tub DME)    Recommendations for Other Services       Precautions / Restrictions Precautions Precautions: Fall Restrictions Weight Bearing Restrictions: No      Mobility Bed Mobility Overal bed mobility: Needs Assistance Bed Mobility: Supine to Sit;Sit to Supine     Supine to sit: Min guard Sit to supine: Min assist   General bed mobility comments: for safety; min A to initiate legs back into bed  Transfers Overall transfer level: Needs assistance Equipment used: None Transfers: Sit to/from Stand Sit to Stand: Min guard         General transfer comment: for safety and min A for dynamic tasks    Balance Overall balance assessment: Needs assistance Sitting-balance support: Feet supported Sitting balance-Leahy Scale: Good       Standing balance-Leahy Scale: (poor to fair)                             ADL either performed or assessed with clinical judgement   ADL Overall ADL's : Needs assistance/impaired Eating/Feeding: Set up   Grooming: Wash/dry hands;Min guard;Standing;Minimal assistance   Upper Body Bathing: Moderate assistance;Sitting   Lower Body Bathing: Maximal assistance;Sit to/from stand   Upper Body Dressing :  Moderate assistance;Sitting   Lower Body Dressing: Maximal assistance;Sit to/from stand   Toilet Transfer: Minimal assistance;Ambulation;Regular Toilet;Grab bars   Toileting- Clothing Manipulation and Hygiene: Minimal assistance;Sit to/from stand         General ADL Comments: ambulated to bathroom with min A. Had 3 LOB; no device was used.  Pt was able to pull pants down and perform hygiene with min A for balance but needed min A to pull pants back up.  Tremor present; did not interfere with grooming. Pt was unwilling to take a drink or bite of anything     Vision         Perception     Praxis      Pertinent Vitals/Pain Pain Assessment: Faces Faces Pain Scale: Hurts little more Pain Location: neck Pain Intervention(s): Limited activity within patient's tolerance;Monitored during session;Repositioned     Hand Dominance Right   Extremity/Trunk Assessment Upper Extremity Assessment Upper Extremity Assessment: Generalized weakness; tremors bil       Cervical / Trunk Assessment Cervical / Trunk Assessment: Kyphotic(can straighten trunk; keeps neck flexed). Can bring neck to neutral   Communication Communication Communication: (little verbalization)   Cognition Arousal/Alertness: Awake/alert Behavior During Therapy: WFL for tasks assessed/performed Overall Cognitive Status: No family/caregiver present to determine baseline cognitive functioning                                 General Comments: pt  slow to respond, decreased initiation, did not follow all one step commands completely and demonstrates decreased safety awareness/awareness of deficits   General Comments       Exercises     Shoulder Instructions      Home Living Family/patient expects to be discharged to:: Private residence Living Arrangements: Other relatives                 Bathroom Shower/Tub: Chief Strategy Officer: Standard     Home Equipment: Environmental consultant - 2  wheels;Cane - single point          Prior Functioning/Environment Level of Independence: Independent with assistive device(s)                 OT Problem List: Decreased strength;Decreased activity tolerance;Impaired balance (sitting and/or standing);Decreased cognition;Decreased knowledge of use of DME or AE;Pain      OT Treatment/Interventions: Self-care/ADL training;DME and/or AE instruction;Patient/family education;Balance training;Therapeutic activities;Cognitive remediation/compensation    OT Goals(Current goals can be found in the care plan section) Acute Rehab OT Goals Patient Stated Goal: none stated OT Goal Formulation: Patient unable to participate in goal setting Time For Goal Achievement: 06/28/17 Potential to Achieve Goals: Fair ADL Goals Pt Will Transfer to Toilet: with min guard assist;ambulating;regular height toilet(with AD as needed) Pt Will Perform Toileting - Clothing Manipulation and hygiene: with min guard assist;sit to/from stand Additional ADL Goal #1: pt will complete adl with min guard for safety when standing Additional ADL Goal #2: Pt will initiate and complete ADL tasks within 20 seconds with 2 cues as needed  OT Frequency: Min 2X/week   Barriers to D/C:            Co-evaluation              AM-PAC PT "6 Clicks" Daily Activity     Outcome Measure Help from another person eating meals?: A Little Help from another person taking care of personal grooming?: A Little Help from another person toileting, which includes using toliet, bedpan, or urinal?: A Little Help from another person bathing (including washing, rinsing, drying)?: A Lot Help from another person to put on and taking off regular upper body clothing?: A Little Help from another person to put on and taking off regular lower body clothing?: A Lot 6 Click Score: 16   End of Session    Activity Tolerance: Patient tolerated treatment well Patient left: in bed;with call  bell/phone within reach(sitter outside of room)  OT Visit Diagnosis: Unsteadiness on feet (R26.81);Muscle weakness (generalized) (M62.81)                Time: 4696-2952 OT Time Calculation (min): 19 min Charges:  OT General Charges $OT Visit: 1 Visit OT Evaluation $OT Eval Low Complexity: 1 Low G-Codes:     [place], OTR/L [phone] 06/14/2017  Amanda Hall 06/14/2017, 10:22 AM

## 2017-07-05 ENCOUNTER — Encounter: Payer: Self-pay | Admitting: Internal Medicine

## 2017-07-13 ENCOUNTER — Other Ambulatory Visit: Payer: Self-pay

## 2017-07-13 ENCOUNTER — Encounter: Payer: Self-pay | Admitting: Internal Medicine

## 2017-07-13 ENCOUNTER — Ambulatory Visit (INDEPENDENT_AMBULATORY_CARE_PROVIDER_SITE_OTHER): Payer: Medicare Other | Admitting: Internal Medicine

## 2017-07-13 VITALS — BP 130/80 | HR 119 | Temp 99.1°F | Wt 120.0 lb

## 2017-07-13 DIAGNOSIS — F332 Major depressive disorder, recurrent severe without psychotic features: Secondary | ICD-10-CM

## 2017-07-13 DIAGNOSIS — F411 Generalized anxiety disorder: Secondary | ICD-10-CM

## 2017-07-13 DIAGNOSIS — E7800 Pure hypercholesterolemia, unspecified: Secondary | ICD-10-CM

## 2017-07-13 MED ORDER — MIRTAZAPINE 45 MG PO TABS: 45.0000 mg | ORAL_TABLET | Freq: Every day | ORAL | 0 refills | Status: DC

## 2017-07-13 MED ORDER — FLUVOXAMINE MALEATE 50 MG PO TABS: 150.0000 mg | ORAL_TABLET | Freq: Every day | ORAL | 0 refills | Status: DC

## 2017-07-13 MED ORDER — OXCARBAZEPINE 150 MG PO TABS: 150.0000 mg | ORAL_TABLET | Freq: Every day | ORAL | 0 refills | Status: DC

## 2017-07-13 MED ORDER — OXCARBAZEPINE 150 MG PO TABS: 75.0000 mg | ORAL_TABLET | Freq: Two times a day (BID) | ORAL | 0 refills | Status: DC

## 2017-07-13 MED ORDER — QUETIAPINE FUMARATE 50 MG PO TABS: 50.0000 mg | ORAL_TABLET | Freq: Every day | ORAL | 0 refills | Status: DC

## 2017-07-13 NOTE — Patient Instructions (Signed)
Please schedule psychiatric follow-up.  Your psychiatrist will need to review and refill all your psychiatric medications  Please follow-up with a new primary care provider in the fall for evaluation and annual flu vaccine

## 2017-07-13 NOTE — Progress Notes (Signed)
Subjective:    Patient ID: Amanda Hall, female    DOB: 01-29-1950, 67 y.o.   MRN: [id number]  HPI 67 year old patient who is seen for follow-up.  She was admitted to the behavioral health facility for 2 weeks and was reevaluated in the ED last month after decompensation after discontinuing her medications.  She has had issues with weight loss excessive worry and anxiety.  At times she becomes quite agitated.  She is on multiple cardioactive medications she does have contact information for psychiatric follow-up that she has not yet pursued She is accompanied by her sister today. At the present time she is doing reasonably well with assistance from her sister.  She has been compliant with her medications.  She is on multiple psychoactive medications  She has had 5 ED visits over the past 3 months  Past Medical History:  Diagnosis Date  . Allergy   . Anemia   . Anxiety   . Depression   . Hemorrhoids      Social History   Socioeconomic History  . Marital status: Divorced    Spouse name: Not on file  . Number of children: Not on file  . Years of education: Not on file  . Highest education level: Not on file  Occupational History  . Not on file  Social Needs  . Financial resource strain: Not on file  . Food insecurity:    Worry: Not on file    Inability: Not on file  . Transportation needs:    Medical: Not on file    Non-medical: Not on file  Tobacco Use  . Smoking status: Never Smoker  . Smokeless tobacco: Never Used  Substance and Sexual Activity  . Alcohol use: No  . Drug use: No  . Sexual activity: Not Currently  Lifestyle  . Physical activity:    Days per week: Not on file    Minutes per session: Not on file  . Stress: Not on file  Relationships  . Social connections:    Talks on phone: Not on file    Gets together: Not on file    Attends religious service: Not on file    Active member of club or organization: Not on file    Attends meetings of clubs or  organizations: Not on file    Relationship status: Not on file  . Intimate partner violence:    Fear of current or ex partner: Not on file    Emotionally abused: Not on file    Physically abused: Not on file    Forced sexual activity: Not on file  Other Topics Concern  . Not on file  Social History Narrative  . Not on file    Past Surgical History:  Procedure Laterality Date  . arm surgery     Left  . TUBAL LIGATION      Family History  Problem Relation Age of Onset  . Stroke Mother   . Hypertension Sister   . Cancer Neg Hx        breat and colon hx  . Diabetes Neg Hx        family    Allergies  Allergen Reactions  . Codeine Other (See Comments)    "makes head fell crazy"  . Sulfonamide Derivatives Nausea Only  . Tetanus Toxoid Other (See Comments)    "Made a Knot"    Current Outpatient Medications on File Prior to Visit  Medication Sig Dispense Refill  . fluvoxaMINE (LUVOX) 50 MG  tablet Take 3 tablets (150 mg total) by mouth at bedtime. For mood control 90 tablet 0  . hydrocortisone cream 1 % Apply to affected area 2 times daily 15 g 0  . LORazepam (ATIVAN) 1 MG tablet Take 1 tablet (1 mg total) by mouth 2 (two) times daily. 60 tablet 3  . megestrol (MEGACE) 40 MG tablet Take 1 tablet (40 mg total) by mouth daily. 30 tablet 0  . mirtazapine (REMERON) 45 MG tablet Take 1 tablet (45 mg total) by mouth at bedtime. For mood control 30 tablet 0  . OXcarbazepine (TRILEPTAL) 150 MG tablet Take 1 tablet (150 mg total) by mouth at bedtime. For mood stability 30 tablet 0  . OXcarbazepine (TRILEPTAL) 150 MG tablet Take 0.5 tablets (75 mg total) by mouth 2 (two) times daily. Mood control 30 tablet 0  . polyethylene glycol (MIRALAX) packet Take 17 g by mouth 2 (two) times daily. 28 each 0  . QUEtiapine (SEROQUEL) 50 MG tablet Take 1 tablet (50 mg total) by mouth at bedtime. For mood control 30 tablet 0   No current facility-administered medications on file prior to visit.      BP 130/80 (BP Location: Right Arm, Patient Position: Sitting, Cuff Size: Large)   Pulse (!) 119   Temp 99.1 F (37.3 C) (Oral)   Wt 120 lb (54.4 kg)   SpO2 97%   BMI 18.79 kg/m      Review of Systems  Constitutional: Positive for appetite change and unexpected weight change.  HENT: Negative for congestion, dental problem, hearing loss, rhinorrhea, sinus pressure, sore throat and tinnitus.   Eyes: Negative for pain, discharge and visual disturbance.  Respiratory: Negative for cough and shortness of breath.   Cardiovascular: Negative for chest pain, palpitations and leg swelling.  Gastrointestinal: Negative for abdominal distention, abdominal pain, blood in stool, constipation, diarrhea, nausea and vomiting.  Genitourinary: Negative for difficulty urinating, dysuria, flank pain, frequency, hematuria, pelvic pain, urgency, vaginal bleeding, vaginal discharge and vaginal pain.  Musculoskeletal: Negative for arthralgias, gait problem and joint swelling.  Skin: Negative for rash.  Neurological: Negative for dizziness, syncope, speech difficulty, weakness, numbness and headaches.  Hematological: Negative for adenopathy.  Psychiatric/Behavioral: Positive for agitation, behavioral problems, decreased concentration and sleep disturbance. Negative for dysphoric mood. The patient is nervous/anxious.        Objective:   Physical Exam  Constitutional: She is oriented to person, place, and time. She appears well-developed and well-nourished. No distress.   T anxious in no acute distresshin,  Blood pressure stable Resting pulse rate 110  Slightly restless  HENT:  Head: Normocephalic.  Right Ear: External ear normal.  Left Ear: External ear normal.  Mouth/Throat: Oropharynx is clear and moist.  Eyes: Pupils are equal, round, and reactive to light. Conjunctivae and EOM are normal.  Neck: Normal range of motion. Neck supple. No thyromegaly present.  Cardiovascular: Normal rate, regular  rhythm, normal heart sounds and intact distal pulses.  Pulmonary/Chest: Effort normal and breath sounds normal.  Abdominal: Soft. Bowel sounds are normal. She exhibits no mass. There is no tenderness.  Musculoskeletal: Normal range of motion.  Lymphadenopathy:    She has no cervical adenopathy.  Neurological: She is alert and oriented to person, place, and time.  Skin: Skin is warm and dry. No rash noted.  Psychiatric: She has a normal mood and affect. Her behavior is normal.          Assessment & Plan:   Anxiety disorder Major depressive disorder  Hypercholesterolemia  All medications refilled.  Patient strongly advised to follow-up with psychiatry.  She does have contact information.  She was notified that needs psychiatric follow-up for follow-up and refill of all future medications The patient has been instructed to follow-up with a new primary provider within the next 3 months  [name]

## 2017-08-15 ENCOUNTER — Other Ambulatory Visit: Payer: Self-pay | Admitting: Internal Medicine

## 2017-08-17 ENCOUNTER — Encounter: Payer: Self-pay | Admitting: Internal Medicine

## 2017-08-17 ENCOUNTER — Other Ambulatory Visit: Payer: Self-pay | Admitting: Internal Medicine

## 2017-08-28 ENCOUNTER — Emergency Department (HOSPITAL_COMMUNITY): Payer: Medicare Other

## 2017-08-28 ENCOUNTER — Other Ambulatory Visit: Payer: Self-pay

## 2017-08-28 ENCOUNTER — Inpatient Hospital Stay (HOSPITAL_COMMUNITY)
Admission: EM | Admit: 2017-08-28 | Discharge: 2017-09-08 | DRG: 640 | Disposition: A | Discharge status: Other Acute Inpatient Hospital | Payer: Medicare Other | Attending: Family Medicine | Admitting: Family Medicine

## 2017-08-28 ENCOUNTER — Encounter (HOSPITAL_COMMUNITY): Payer: Self-pay | Admitting: Emergency Medicine

## 2017-08-28 DIAGNOSIS — R410 Disorientation, unspecified: Secondary | ICD-10-CM

## 2017-08-28 DIAGNOSIS — E7800 Pure hypercholesterolemia, unspecified: Secondary | ICD-10-CM | POA: Diagnosis present

## 2017-08-28 DIAGNOSIS — E860 Dehydration: Secondary | ICD-10-CM | POA: Diagnosis present

## 2017-08-28 DIAGNOSIS — E43 Unspecified severe protein-calorie malnutrition: Secondary | ICD-10-CM | POA: Diagnosis present

## 2017-08-28 DIAGNOSIS — Z79899 Other long term (current) drug therapy: Secondary | ICD-10-CM | POA: Diagnosis not present

## 2017-08-28 DIAGNOSIS — Z681 Body mass index (BMI) 19 or less, adult: Secondary | ICD-10-CM

## 2017-08-28 DIAGNOSIS — R531 Weakness: Secondary | ICD-10-CM

## 2017-08-28 DIAGNOSIS — F419 Anxiety disorder, unspecified: Secondary | ICD-10-CM | POA: Diagnosis not present

## 2017-08-28 DIAGNOSIS — E785 Hyperlipidemia, unspecified: Secondary | ICD-10-CM | POA: Diagnosis present

## 2017-08-28 DIAGNOSIS — K649 Unspecified hemorrhoids: Secondary | ICD-10-CM | POA: Diagnosis present

## 2017-08-28 DIAGNOSIS — F29 Unspecified psychosis not due to a substance or known physiological condition: Secondary | ICD-10-CM | POA: Diagnosis not present

## 2017-08-28 DIAGNOSIS — F32A Depression, unspecified: Secondary | ICD-10-CM | POA: Diagnosis present

## 2017-08-28 DIAGNOSIS — F332 Major depressive disorder, recurrent severe without psychotic features: Secondary | ICD-10-CM | POA: Diagnosis present

## 2017-08-28 DIAGNOSIS — D649 Anemia, unspecified: Secondary | ICD-10-CM | POA: Diagnosis present

## 2017-08-28 DIAGNOSIS — R4182 Altered mental status, unspecified: Secondary | ICD-10-CM | POA: Diagnosis not present

## 2017-08-28 DIAGNOSIS — E876 Hypokalemia: Principal | ICD-10-CM

## 2017-08-28 DIAGNOSIS — F329 Major depressive disorder, single episode, unspecified: Secondary | ICD-10-CM

## 2017-08-28 DIAGNOSIS — Z818 Family history of other mental and behavioral disorders: Secondary | ICD-10-CM

## 2017-08-28 DIAGNOSIS — F411 Generalized anxiety disorder: Secondary | ICD-10-CM | POA: Diagnosis present

## 2017-08-28 DIAGNOSIS — R000 Tachycardia, unspecified: Secondary | ICD-10-CM | POA: Diagnosis not present

## 2017-08-28 DIAGNOSIS — W19XXXA Unspecified fall, initial encounter: Secondary | ICD-10-CM | POA: Diagnosis not present

## 2017-08-28 DIAGNOSIS — R404 Transient alteration of awareness: Secondary | ICD-10-CM | POA: Diagnosis not present

## 2017-08-28 DIAGNOSIS — F05 Delirium due to known physiological condition: Secondary | ICD-10-CM | POA: Diagnosis not present

## 2017-08-28 DIAGNOSIS — R0902 Hypoxemia: Secondary | ICD-10-CM | POA: Diagnosis not present

## 2017-08-28 LAB — HEPATIC FUNCTION PANEL
ALK PHOS: 59 U/L (ref 38–126)
ALT: 20 U/L (ref 0–44)
AST: 29 U/L (ref 15–41)
Albumin: 3.7 g/dL (ref 3.5–5.0)
BILIRUBIN DIRECT: 0.1 mg/dL (ref 0.0–0.2)
BILIRUBIN INDIRECT: 0.7 mg/dL (ref 0.3–0.9)
Total Bilirubin: 0.8 mg/dL (ref 0.3–1.2)
Total Protein: 6.2 g/dL — ABNORMAL LOW (ref 6.5–8.1)

## 2017-08-28 LAB — I-STAT CG4 LACTIC ACID, ED: Lactic Acid, Venous: 0.91 mmol/L (ref 0.5–1.9)

## 2017-08-28 LAB — URINALYSIS, ROUTINE W REFLEX MICROSCOPIC
Bilirubin Urine: NEGATIVE
GLUCOSE, UA: 50 mg/dL — AB
HGB URINE DIPSTICK: NEGATIVE
Ketones, ur: 5 mg/dL — AB
LEUKOCYTES UA: NEGATIVE
Nitrite: NEGATIVE
PH: 6 (ref 5.0–8.0)
PROTEIN: NEGATIVE mg/dL
SPECIFIC GRAVITY, URINE: 1.024 (ref 1.005–1.030)

## 2017-08-28 LAB — CBC WITH DIFFERENTIAL/PLATELET
Basophils Absolute: 0 10*3/uL (ref 0.0–0.1)
Basophils Relative: 0 %
EOS ABS: 0 10*3/uL (ref 0.0–0.7)
EOS PCT: 0 %
HCT: 35.7 % — ABNORMAL LOW (ref 36.0–46.0)
Hemoglobin: 12 g/dL (ref 12.0–15.0)
Lymphocytes Relative: 16 %
Lymphs Abs: 0.6 10*3/uL — ABNORMAL LOW (ref 0.7–4.0)
MCH: 30.1 pg (ref 26.0–34.0)
MCHC: 33.6 g/dL (ref 30.0–36.0)
MCV: 89.5 fL (ref 78.0–100.0)
MONO ABS: 0.4 10*3/uL (ref 0.1–1.0)
MONOS PCT: 9 %
NEUTROS PCT: 75 %
Neutro Abs: 3.1 10*3/uL (ref 1.7–7.7)
PLATELETS: 254 10*3/uL (ref 150–400)
RBC: 3.99 MIL/uL (ref 3.87–5.11)
RDW: 13 % (ref 11.5–15.5)
WBC: 4.1 10*3/uL (ref 4.0–10.5)

## 2017-08-28 LAB — SALICYLATE LEVEL: Salicylate Lvl: <7 mg/dL (ref 2.8–30.0)

## 2017-08-28 LAB — BASIC METABOLIC PANEL
Anion gap: 12 (ref 5–15)
BUN: 41 mg/dL — AB (ref 8–23)
CHLORIDE: 104 mmol/L (ref 98–111)
CO2: 31 mmol/L (ref 22–32)
CREATININE: 1.05 mg/dL — AB (ref 0.44–1.00)
Calcium: 10.1 mg/dL (ref 8.9–10.3)
GFR calc Af Amer: >60 mL/min (ref >60)
GFR calc non Af Amer: 54 mL/min — ABNORMAL LOW (ref >60)
GLUCOSE: 153 mg/dL — AB (ref 70–99)
POTASSIUM: 2.5 mmol/L — AB (ref 3.5–5.1)
SODIUM: 147 mmol/L — AB (ref 135–145)

## 2017-08-28 LAB — BLOOD GAS, VENOUS
ACID-BASE EXCESS: 10.4 mmol/L — AB (ref 0.0–2.0)
BICARBONATE: 34.8 mmol/L — AB (ref 20.0–28.0)
O2 SAT: 96.5 %
PCO2 VEN: 45.6 mmHg (ref 44.0–60.0)
PO2 VEN: 85.7 mmHg — AB (ref 32.0–45.0)
Patient temperature: 98.6
pH, Ven: 7.494 — ABNORMAL HIGH (ref 7.250–7.430)

## 2017-08-28 LAB — ACETAMINOPHEN LEVEL: Acetaminophen (Tylenol), Serum: <10 ug/mL — ABNORMAL LOW (ref 10–30)

## 2017-08-28 LAB — AMMONIA: AMMONIA: 21 umol/L (ref 9–35)

## 2017-08-28 MED ORDER — POTASSIUM CHLORIDE CRYS ER 20 MEQ PO TBCR
40.0000 meq | EXTENDED_RELEASE_TABLET | Freq: Once | ORAL | Status: AC
  Administered 2017-08-28: 40 meq via ORAL
  Filled 2017-08-28: qty 2

## 2017-08-28 MED ORDER — HALOPERIDOL 5 MG PO TABS
5.0000 mg | ORAL_TABLET | Freq: Four times a day (QID) | ORAL | Status: DC | PRN
  Administered 2017-08-29 – 2017-09-06 (×11): 5 mg via ORAL
  Filled 2017-08-28 (×14): qty 1

## 2017-08-28 MED ORDER — ATORVASTATIN CALCIUM 20 MG PO TABS
20.0000 mg | ORAL_TABLET | Freq: Every day | ORAL | Status: DC
  Administered 2017-08-29 – 2017-09-07 (×8): 20 mg via ORAL
  Filled 2017-08-28 (×9): qty 1

## 2017-08-28 MED ORDER — MIRTAZAPINE 15 MG PO TABS
45.0000 mg | ORAL_TABLET | Freq: Every day | ORAL | Status: DC
  Administered 2017-08-29 – 2017-09-07 (×10): 45 mg via ORAL
  Filled 2017-08-28 (×10): qty 3

## 2017-08-28 MED ORDER — OXCARBAZEPINE 150 MG PO TABS
75.0000 mg | ORAL_TABLET | Freq: Two times a day (BID) | ORAL | Status: DC
  Administered 2017-08-29: 75 mg via ORAL
  Filled 2017-08-28 (×2): qty 0.5

## 2017-08-28 MED ORDER — QUETIAPINE FUMARATE 25 MG PO TABS: 50.0000 mg | ORAL_TABLET | Freq: Every day | ORAL | Status: DC

## 2017-08-28 MED ORDER — LORAZEPAM 1 MG PO TABS
1.0000 mg | ORAL_TABLET | Freq: Two times a day (BID) | ORAL | Status: DC
  Administered 2017-08-29 (×2): 1 mg via ORAL
  Filled 2017-08-28 (×2): qty 2

## 2017-08-28 MED ORDER — HALOPERIDOL LACTATE 5 MG/ML IJ SOLN
2.0000 mg | Freq: Four times a day (QID) | INTRAMUSCULAR | Status: DC | PRN
  Administered 2017-08-29 – 2017-09-06 (×4): 2 mg via INTRAMUSCULAR
  Filled 2017-08-28 (×6): qty 1

## 2017-08-28 MED ORDER — ENOXAPARIN SODIUM 40 MG/0.4ML ~~LOC~~ SOLN
40.0000 mg | Freq: Every day | SUBCUTANEOUS | Status: DC
  Administered 2017-08-28 – 2017-09-07 (×8): 40 mg via SUBCUTANEOUS
  Filled 2017-08-28 (×10): qty 0.4

## 2017-08-28 MED ORDER — SODIUM CHLORIDE 0.9 % IV BOLUS
1000.0000 mL | Freq: Once | INTRAVENOUS | Status: AC
  Administered 2017-08-28: 1000 mL via INTRAVENOUS

## 2017-08-28 MED ORDER — POTASSIUM CHLORIDE 10 MEQ/100ML IV SOLN
10.0000 meq | INTRAVENOUS | Status: AC
  Administered 2017-08-28 (×4): 10 meq via INTRAVENOUS
  Filled 2017-08-28 (×4): qty 100

## 2017-08-28 MED ORDER — LORAZEPAM 2 MG/ML IJ SOLN
0.5000 mg | Freq: Once | INTRAMUSCULAR | Status: AC
  Administered 2017-08-28: 0.5 mg via INTRAVENOUS
  Filled 2017-08-28: qty 1

## 2017-08-28 MED ORDER — FLUVOXAMINE MALEATE 50 MG PO TABS
150.0000 mg | ORAL_TABLET | Freq: Every day | ORAL | Status: DC
  Administered 2017-08-29 – 2017-09-07 (×10): 150 mg via ORAL
  Filled 2017-08-28 (×11): qty 3

## 2017-08-28 MED ORDER — POLYETHYLENE GLYCOL 3350 17 G PO PACK: 17.0000 g | PACK | Freq: Every day | ORAL | Status: DC | PRN

## 2017-08-28 MED ORDER — POTASSIUM CHLORIDE IN NACL 40-0.9 MEQ/L-% IV SOLN
INTRAVENOUS | Status: DC
  Administered 2017-08-28: 125 mL/h via INTRAVENOUS
  Administered 2017-08-29: 75 mL/h via INTRAVENOUS
  Administered 2017-08-29: 125 mL/h via INTRAVENOUS
  Filled 2017-08-28 (×4): qty 1000

## 2017-08-28 NOTE — ED Notes (Signed)
Attempted to call report x1. Accepting nurse unable to take report at this time

## 2017-08-28 NOTE — ED Notes (Signed)
EKG given to EDP,Ray,MD., for review. 

## 2017-08-28 NOTE — ED Notes (Signed)
Bed: WA17 Expected date:  Expected time:  Means of arrival:  Comments: 67 yo medical clearance

## 2017-08-28 NOTE — ED Notes (Signed)
ED TO INPATIENT HANDOFF REPORT  Name/Age/Gender Amanda Hall 67 y.o. female  Code Status Code Status History    Date Active Date Inactive Code Status Order ID Comments User Context   06/12/2017 1602 06/14/2017 2145 Full Code [id number]  Lajean Saver, MD ED   05/26/2017 1611 06/10/2017 1539 Full Code [id number]  Patrecia Pour, NP Inpatient   05/18/2017 0614 05/18/2017 1724 Full Code [id number]  Delora Fuel, MD ED   05/10/2017 1059 05/10/2017 1642 Full Code [id number]  Varney Biles, MD ED      Home/SNF/Other Home  Chief Complaint behavioral  Level of Care/Admitting Diagnosis ED Disposition    ED Disposition Condition [facility] Area: [facility] [[id number]]  Level of Care: Telemetry [5]  Admit to tele based on following criteria: Complex arrhythmia (Bradycardia/Tachycardia)  Diagnosis: Hypokalemia [038882]  Admitting Physician: Elwyn Reach [[id number]]  Attending Physician: Elwyn Reach [[id number]]  Estimated length of stay: past midnight tomorrow  Certification:: I certify this patient will need inpatient services for at least 2 midnights  PT Class (Do Not Modify): Inpatient [101]  PT Acc Code (Do Not Modify): Private [1]       Medical History Past Medical History:  Diagnosis Date  . Allergy   . Anemia   . Anxiety   . Depression   . Hemorrhoids     Allergies Allergies  Allergen Reactions  . Codeine Other (See Comments)    "makes head fell crazy"  . Sulfonamide Derivatives Nausea Only  . Tetanus Toxoid Other (See Comments)    "Made a Knot"    IV Location/Drains/Wounds Patient Lines/Drains/Airways Status   Active Line/Drains/Airways    Name:   Placement date:   Placement time:   Site:   Days:   Peripheral IV 08/28/17 Left Antecubital   08/28/17    1900    Antecubital   less than 1          Labs/Imaging Results for orders placed or performed during the hospital encounter of 08/28/17 (from the past 48 hour(s))  CBC with  Differential     Status: Abnormal   Collection Time: 08/28/17  7:25 PM  Result Value Ref Range   WBC 4.1 4.0 - 10.5 K/uL   RBC 3.99 3.87 - 5.11 MIL/uL   Hemoglobin 12.0 12.0 - 15.0 g/dL   HCT 35.7 (L) 36.0 - 46.0 %   MCV 89.5 78.0 - 100.0 fL   MCH 30.1 26.0 - 34.0 pg   MCHC 33.6 30.0 - 36.0 g/dL   RDW 13.0 11.5 - 15.5 %   Platelets 254 150 - 400 K/uL   Neutrophils Relative % 75 %   Neutro Abs 3.1 1.7 - 7.7 K/uL   Lymphocytes Relative 16 %   Lymphs Abs 0.6 (L) 0.7 - 4.0 K/uL   Monocytes Relative 9 %   Monocytes Absolute 0.4 0.1 - 1.0 K/uL   Eosinophils Relative 0 %   Eosinophils Absolute 0.0 0.0 - 0.7 K/uL   Basophils Relative 0 %   Basophils Absolute 0.0 0.0 - 0.1 K/uL    Comment: Performed at [facility], [place] [address], [place], [place] [address]  Basic metabolic panel     Status: Abnormal   Collection Time: 08/28/17  7:25 PM  Result Value Ref Range   Sodium 147 (H) 135 - 145 mmol/L   Potassium 2.5 (LL) 3.5 - 5.1 mmol/L    Comment: CRITICAL RESULT CALLED TO, READ BACK BY AND VERIFIED WITH:  Almon Register RN 2019 08/28/2017 H[place] [facility]    Chloride 104 98 - 111 mmol/L   CO2 31 22 - 32 mmol/L   Glucose, Bld 153 (H) 70 - 99 mg/dL   BUN 41 (H) 8 - 23 mg/dL   Creatinine, Ser 1.05 (H) 0.44 - 1.00 mg/dL   Calcium 10.1 8.9 - 10.3 mg/dL   GFR calc non Af Amer 54 (L) >60 mL/min   GFR calc Af Amer >60 >60 mL/min    Comment: (NOTE) The eGFR has been calculated using the CKD EPI equation. This calculation has not been validated in all clinical situations. eGFR's persistently <60 mL/min signify possible Chronic Kidney Disease.    Anion gap 12 5 - 15    Comment: Performed at [facility], [place] [address] [place], [place] [address]  Ammonia     Status: None   Collection Time: 08/28/17  7:25 PM  Result Value Ref Range   Ammonia 21 9 - 35 umol/L    Comment: Performed at [facility], [place] [address] [place], [place] [address]   Urinalysis, Routine w reflex microscopic     Status: Abnormal   Collection Time: 08/28/17  7:25 PM  Result Value Ref Range   Color, Urine YELLOW YELLOW   APPearance CLEAR CLEAR   Specific Gravity, Urine 1.024 1.005 - 1.030   pH 6.0 5.0 - 8.0   Glucose, UA 50 (A) NEGATIVE mg/dL   Hgb urine dipstick NEGATIVE NEGATIVE   Bilirubin Urine NEGATIVE NEGATIVE   Ketones, ur 5 (A) NEGATIVE mg/dL   Protein, ur NEGATIVE NEGATIVE mg/dL   Nitrite NEGATIVE NEGATIVE   Leukocytes, UA NEGATIVE NEGATIVE    Comment: Performed at [place] [address] [place], [place] [address]  Hepatic function panel     Status: Abnormal   Collection Time: 08/28/17  7:38 PM  Result Value Ref Range   Total Protein 6.2 (L) 6.5 - 8.1 g/dL   Albumin 3.7 3.5 - 5.0 g/dL   AST 29 15 - 41 U/L   ALT 20 0 - 44 U/L   Alkaline Phosphatase 59 38 - 126 U/L   Total Bilirubin 0.8 0.3 - 1.2 mg/dL   Bilirubin, Direct 0.1 0.0 - 0.2 mg/dL   Indirect Bilirubin 0.7 0.3 - 0.9 mg/dL    Comment: Performed at [facility], [place] [address], [place], [place] [address]  Acetaminophen level     Status: Abnormal   Collection Time: 08/28/17  7:38 PM  Result Value Ref Range   Acetaminophen (Tylenol), Serum <10 (L) 10 - 30 ug/mL    Comment: (NOTE) Therapeutic concentrations vary significantly. A range of 10-30 ug/mL  may be an effective concentration for many patients. However, some  are best treated at concentrations outside of this range. Acetaminophen concentrations >150 ug/mL at 4 hours after ingestion  and >50 ug/mL at 12 hours after ingestion are often associated with  toxic reactions. Performed at [facility], [place] [address] [place], [place] [address]   Salicylate level     Status: None   Collection Time: 08/28/17  7:38 PM  Result Value Ref Range   Salicylate Lvl <1.2 2.8 - 30.0 mg/dL    Comment: Performed at [facility], [place] [address],  [place], [place] [address]  I-Stat CG4 Lactic Acid, ED     Status: None   Collection Time: 08/28/17  7:42 PM  Result Value Ref Range   Lactic Acid, Venous  0.91 0.5 - 1.9 mmol/L  Blood gas, venous     Status: Abnormal   Collection Time: 08/28/17  7:44 PM  Result Value Ref Range   pH, Ven 7.494 (H) 7.250 - 7.430   pCO2, Ven 45.6 44.0 - 60.0 mmHg   pO2, Ven 85.7 (H) 32.0 - 45.0 mmHg   Bicarbonate 34.8 (H) 20.0 - 28.0 mmol/L   Acid-Base Excess 10.4 (H) 0.0 - 2.0 mmol/L   O2 Saturation 96.5 %   Patient temperature 98.6    Collection site VEIN    Drawn by  COLLECTED BY RN    Sample type VEIN     Comment: Performed at [place] [address], [place], [place] [address]   No results found.  Pending Labs Unresulted Labs (From admission, onward)    Start     Ordered   08/28/17 1937  Blood culture (routine x 2)  BLOOD CULTURE X 2,   STAT     08/28/17 1937          Vitals/Pain Today's Vitals   08/28/17 1945 08/28/17 2000 08/28/17 2015 08/28/17 2030  BP: 121/74 123/80 (!) 144/77 128/79  Pulse: 69 65 79 60  Resp: 16 17 (!) 23 16  Temp:      TempSrc:      SpO2: 96% 98% 100% 98%  PainSc:        Isolation Precautions No active isolations  Medications Medications  potassium chloride 10 mEq in 100 mL IVPB (10 mEq Intravenous New Bag/Given 08/28/17 2039)  sodium chloride 0.9 % bolus 1,000 mL (1,000 mLs Intravenous New Bag/Given 08/28/17 1958)  potassium chloride SA (K-DUR,KLOR-CON) CR tablet 40 mEq (40 mEq Oral Given 08/28/17 2036)    Mobility walks

## 2017-08-28 NOTE — ED Notes (Signed)
Date and time results received: 08/28/17 2020 Test: Potassium Critical Value:2.5  Name of Provider Notified:Dr. Ray  Orders Received? Or Actions Taken?: see orders

## 2017-08-28 NOTE — ED Triage Notes (Signed)
Pt arriving via [facility] from home. Family called sheriffs dept for pt "acting out". Pt reports pt has not been eating and acting erratically for the last 3 days. Pt has hx of anxiety and depression. Alert to person and place.  2.5 Haldol IM (1845) 2.5 Versed IV (1835)

## 2017-08-28 NOTE — ED Provider Notes (Signed)
[facility]-EMERGENCY DEPT Provider Note   CSN: [id number] Arrival date & time: 08/28/17  1906     History   Chief Complaint Chief Complaint  Patient presents with  . Manic Behavior   Level 5 caveat HPI Amanda Hall is a 67 y.o. female.  HPI  67 yo female presents via ems with report of manic behavior with agitation and striking at people.  EMS gave prehospital versed and haldol. Patient is sleepy and only able to give partial history.  She has no insight into previously mentioned behavior.  Reports she has not eaten or drank for 4 days.  Deneis pain, fever, nausea, vomiting, or diarrhea.  Denies othr medical problems or taking meds.   Review of medical records reveals following most recent note from primary care Current Outpatient Medications on File Prior to Visit Medication Sig Dispense Refill . fluvoxaMINE (LUVOX) 50 MG tablet Take 3 tablets (150 mg total) by mouth at bedtime. For mood control 90 tablet 0 . hydrocortisone cream 1 % Apply to affected area 2 times daily 15 g 0 . LORazepam (ATIVAN) 1 MG tablet Take 1 tablet (1 mg total) by mouth 2 (two) times daily. 60 tablet 3 . megestrol (MEGACE) 40 MG tablet Take 1 tablet (40 mg total) by mouth daily. 30 tablet 0 . mirtazapine (REMERON) 45 MG tablet Take 1 tablet (45 mg total) by mouth at bedtime. For mood control 30 tablet 0 . OXcarbazepine (TRILEPTAL) 150 MG tablet Take 1 tablet (150 mg total) by mouth at bedtime. For mood stability 30 tablet 0 . OXcarbazepine (TRILEPTAL) 150 MG tablet Take 0.5 tablets (75 mg total) by mouth 2 (two) times daily. Mood control 30 tablet 0 . polyethylene glycol (MIRALAX) packet Take 17 g by mouth 2 (two) times daily. 28 each 0 . QUEtiapine (SEROQUEL) 50 MG tablet Take 1 tablet (50 mg total) by mouth at bedtime. For mood control HPI 67 year old patient who is seen for follow-up.  She was admitted to the behavioral health facility for 2 weeks  and was reevaluated in the ED last month after decompensation after discontinuing her medications.  She has had issues with weight loss excessive worry and anxiety.  At times she becomes quite agitated.  She is on multiple cardioactive medications she does have contact information for psychiatric follow-up that she has not yet pursued She is accompanied by her sister today. At the present time she is doing reasonably well with assistance from her sister.  She has been compliant with her medications.  She is on multiple psychoactive medications     Past Medical History:  Diagnosis Date  . Allergy   . Anemia   . Anxiety   . Depression   . Hemorrhoids     Patient Active Problem List   Diagnosis Date Noted  . Generalized anxiety disorder 05/26/2017  . Major depressive disorder, recurrent severe without psychotic features (HCC) 05/26/2017  . Hypercholesterolemia 03/22/2017  . Nonspecific (abnormal) findings on radiological and other examination of body structure 12/26/2006  . NONSPECIFIC ABNORM FIND RAD&OTH EXAM LUNG FIELD 12/26/2006  . ANEMIA-IRON DEFICIENCY 12/15/2006  . Depression 12/15/2006  . Allergic rhinitis 12/15/2006    Past Surgical History:  Procedure Laterality Date  . arm surgery     Left  . TUBAL LIGATION       OB History   None      Home Medications    Prior to Admission medications   Medication Sig Start Date End Date Taking? Authorizing Provider  fluvoxaMINE (LUVOX) 50 MG tablet Take 3 tablets (150 mg total) by mouth at bedtime. For mood control 07/13/17   Gordy SaversKwiatkowski, Peter F, MD  hydrocortisone cream 1 % Apply to affected area 2 times daily 04/18/17   Palumbo, April, MD  LORazepam (ATIVAN) 1 MG tablet Take 1 tablet (1 mg total) by mouth 2 (two) times daily. 03/22/17   Gordy SaversKwiatkowski, Peter F, MD  megestrol (MEGACE) 40 MG tablet Take 1 tablet (40 mg total) by mouth daily. 06/11/17   Money, Gerlene Burdockravis B, FNP  mirtazapine (REMERON) 45 MG tablet Take 1 tablet (45 mg total)  by mouth at bedtime. For mood control 07/13/17   Gordy SaversKwiatkowski, Peter F, MD  OXcarbazepine (TRILEPTAL) 150 MG tablet Take 1 tablet (150 mg total) by mouth at bedtime. For mood stability 07/13/17   Gordy SaversKwiatkowski, Peter F, MD  OXcarbazepine (TRILEPTAL) 150 MG tablet Take 0.5 tablets (75 mg total) by mouth 2 (two) times daily. Mood control 07/13/17   Gordy SaversKwiatkowski, Peter F, MD  polyethylene glycol [facility](MIRALAX) packet Take 17 g by mouth 2 (two) times daily. 04/18/17   Palumbo, April, MD  QUEtiapine (SEROQUEL) 50 MG tablet Take 1 tablet (50 mg total) by mouth at bedtime. For mood control 07/13/17   Gordy SaversKwiatkowski, Peter F, MD    Family History Family History  Problem Relation Age of Onset  . Stroke Mother   . Hypertension Sister   . Cancer Neg Hx        breat and colon hx  . Diabetes Neg Hx        family    Social History Social History   Tobacco Use  . Smoking status: Never Smoker  . Smokeless tobacco: Never Used  Substance Use Topics  . Alcohol use: No  . Drug use: No     Allergies   Codeine; Sulfonamide derivatives; and Tetanus toxoid   Review of Systems Review of Systems  Neurological: Positive for weakness.  All other systems reviewed and are negative.    Physical Exam Updated Vital Signs Temp 99 F (37.2 C) (Rectal)   SpO2 99%   Physical Exam  Constitutional: She is oriented to person, place, and time. She appears well-developed and well-nourished.  HENT:  Mm appear dry  Eyes: Pupils are equal, round, and reactive to light. EOM are normal.  Neck: Normal range of motion. Neck supple.  Cardiovascular: Normal rate, regular rhythm and normal heart sounds.  Pulmonary/Chest: Effort normal and breath sounds normal.  Abdominal: Soft. Bowel sounds are normal.  Neurological: She is alert and oriented to person, place, and time. She exhibits abnormal muscle tone.  Generalized weakness with decreased ability to move arms and legs- symmetrical  Skin: Skin is warm and dry. Capillary refill  takes less than 2 seconds.  Psychiatric: She has a normal mood and affect.  Nursing note and vitals reviewed.    ED Treatments / Results  Labs (all labs ordered are listed, but only abnormal results are displayed) Labs Reviewed  CULTURE, BLOOD (ROUTINE X 2)  CULTURE, BLOOD (ROUTINE X 2)  CBC WITH DIFFERENTIAL/PLATELET  BASIC METABOLIC PANEL  AMMONIA  URINALYSIS, ROUTINE W REFLEX MICROSCOPIC  BLOOD GAS, VENOUS  HEPATIC FUNCTION PANEL  ACETAMINOPHEN LEVEL  SALICYLATE LEVEL  I-STAT CG4 LACTIC ACID, ED    EKG EKG Interpretation  Date/Time:  Sunday August 28 2017 20:16:34 EDT Ventricular Rate:  64 PR Interval:    QRS Duration: 100 QT Interval:  467 QTC Calculation: 482 R Axis:   63 Text Interpretation:  Sinus  rhythm Confirmed by Margarita Grizzle [phone]) on 08/28/2017 8:24:15 PM   Radiology No results found.  Procedures Procedures (including critical care time)  Medications Ordered in ED Medications  sodium chloride 0.9 % bolus 1,000 mL (has no administration in time range)     Initial Impression / Assessment and Plan / ED Course  I have reviewed the triage vital signs and the nursing notes.  Pertinent labs & imaging results that were available during my care of the patient were reviewed by me and considered in my medical decision making (see chart for details).      67 year old female history of anxiety and depression who presents today with symptoms consistent with agitated delirium and hypokalemia.  Here she is treated with IV fluids and being started on runs of potassium.  She appears hemodynamically stable.  Plan admission for further evaluation and treatment. Patient is currently calm, but prehosital reports that she was agitated and striking at people. She is currently calm and cooperative, but will need psych evaluation in am. Discussed with Dr.Garba  Final Clinical Impressions(s) / ED Diagnoses   Final diagnoses:  Hypokalemia  Weakness  Delirium    ED  Discharge Orders    None       Margarita Grizzle, MD 08/28/17 2104

## 2017-08-28 NOTE — ED Notes (Signed)
Pt has gray urine culture in lab if needed

## 2017-08-28 NOTE — H&P (Addendum)
History and Physical    Amanda BreezeDana Raimer AOZ:[id number] DOB: Mar 23, 1950 DOA: 08/28/2017  Referring MD/NP/PA: Dr. Rosalia Hammersay  PCP: Gordy SaversKwiatkowski, Peter F, MD   Outpatient Specialists: None  Patient coming from: Home  Chief Complaint: Generalized weakness and Manja  HPI: Amanda Hall is a 67 y.o. female with medical history significant of depression anxiety as well as bipolar disorder was initially brought into the hospital with complaint of manic behavior with agitation.  She was said to be striking people at the time.  Not suicidal or homicidal.  She has severe anxiety disorder.  Patient was given Versed and Haldol on the way to the hospital.  Patient is now calm and collected and communicating.  She complained of poor oral intake and weakness for the last 4 days.  She has not eaten on drank anything for at least 4 days now.  She has not seen a psychiatrist lately.  Patient is feeling weak and tired.  Her work-up in the ER showed dehydration with profound hypokalemia potassium 2.5.  Patient is being admitted for treatment of her medical symptoms.  ED Course: Potassium was found to be 2.5 otherwise normal vital signs.  Chest x-ray is normal.  EKG showed sinus rhythm with shortened PR interval.  Actiq acid is negative, drug screen so far negative.  Patient is calm and collected after Haldol and Versed and she is being admitted for treatment.  Review of Systems: As per HPI otherwise 10 point review of systems negative.    Past Medical History:  Diagnosis Date  . Allergy   . Anemia   . Anxiety   . Depression   . Hemorrhoids     Past Surgical History:  Procedure Laterality Date  . arm surgery     Left  . TUBAL LIGATION       reports that she has never smoked. She has never used smokeless tobacco. She reports that she does not drink alcohol or use drugs.  Allergies  Allergen Reactions  . Codeine Other (See Comments)    "makes head fell crazy"  . Sulfonamide Derivatives Nausea Only  . Tetanus  Toxoid Other (See Comments)    "Made a Knot"    Family History  Problem Relation Age of Onset  . Stroke Mother   . Hypertension Sister   . Cancer Neg Hx        breat and colon hx  . Diabetes Neg Hx        family    Prior to Admission medications   Medication Sig Start Date End Date Taking? Authorizing Provider  atorvastatin (LIPITOR) 20 MG tablet Take 20 mg by mouth daily. 08/16/17  Yes [provider]  fluvoxaMINE (LUVOX) 50 MG tablet Take 3 tablets (150 mg total) by mouth at bedtime. For mood control 07/13/17  Yes Gordy SaversKwiatkowski, Peter F, MD  LORazepam (ATIVAN) 1 MG tablet Take 1 tablet (1 mg total) by mouth 2 (two) times daily. 03/22/17  Yes Gordy SaversKwiatkowski, Peter F, MD  mirtazapine (REMERON) 45 MG tablet Take 1 tablet (45 mg total) by mouth at bedtime. For mood control 07/13/17  Yes Gordy SaversKwiatkowski, Peter F, MD  OXcarbazepine (TRILEPTAL) 150 MG tablet Take 0.5 tablets (75 mg total) by mouth 2 (two) times daily. Mood control 07/13/17  Yes Gordy SaversKwiatkowski, Peter F, MD  polyethylene glycol [facility](MIRALAX) packet Take 17 g by mouth 2 (two) times daily. Patient taking differently: Take 17 g by mouth daily as needed for mild constipation or moderate constipation.  04/18/17  Yes Palumbo, April,  MD  QUEtiapine (SEROQUEL) 50 MG tablet Take 1 tablet (50 mg total) by mouth at bedtime. For mood control 07/13/17  Yes Gordy SaversKwiatkowski, Peter F, MD  hydrocortisone cream 1 % Apply to affected area 2 times daily Patient not taking: Reported on 08/28/2017 04/18/17   Palumbo, April, MD  megestrol (MEGACE) 40 MG tablet Take 1 tablet (40 mg total) by mouth daily. Patient not taking: Reported on 08/28/2017 06/11/17   Money, Gerlene Burdockravis B, FNP  OXcarbazepine (TRILEPTAL) 150 MG tablet Take 1 tablet (150 mg total) by mouth at bedtime. For mood stability Patient not taking: Reported on 08/28/2017 07/13/17   Gordy SaversKwiatkowski, Peter F, MD    Physical Exam: Vitals:   08/28/17 2100 08/28/17 2115 08/28/17 2130 08/28/17 2203  BP: (!) 155/73 132/68 129/67  131/73  Pulse: 80 73 (!) 59 63  Resp: 18 18 (!) 24 14  Temp:    97.7 F (36.5 C)  TempSrc:    Oral  SpO2: 100% 98% 98% 100%  Weight:    49 kg      Constitutional: NAD, calm, comfortable Vitals:   08/28/17 2100 08/28/17 2115 08/28/17 2130 08/28/17 2203  BP: (!) 155/73 132/68 129/67 131/73  Pulse: 80 73 (!) 59 63  Resp: 18 18 (!) 24 14  Temp:    97.7 F (36.5 C)  TempSrc:    Oral  SpO2: 100% 98% 98% 100%  Weight:    49 kg   Eyes: PERRL, lids and conjunctivae normal ENMT: Mucous membranes are moist. Posterior pharynx clear of any exudate or lesions.Normal dentition.  Neck: normal, supple, no masses, no thyromegaly Respiratory: clear to auscultation bilaterally, no wheezing, no crackles. Normal respiratory effort. No accessory muscle use.  Cardiovascular: Regular rate and rhythm, no murmurs / rubs / gallops. No extremity edema. 2+ pedal pulses. No carotid bruits.  Abdomen: no tenderness, no masses palpated. No hepatosplenomegaly. Bowel sounds positive.  Musculoskeletal: no clubbing / cyanosis. No joint deformity upper and lower extremities. Good ROM, no contractures. Normal muscle tone.  Skin: no rashes, lesions, ulcers. No induration Neurologic: CN 2-12 grossly intact. Sensation intact, DTR normal. Strength 5/5 in all 4.  Psychiatric:Depressed, withdrawn, not suicidal, not homicidal  Labs on Admission: I have personally reviewed following labs and imaging studies  CBC: Recent Labs  Lab 08/28/17 1925  WBC 4.1  NEUTROABS 3.1  HGB 12.0  HCT 35.7*  MCV 89.5  PLT 254   Basic Metabolic Panel: Recent Labs  Lab 08/28/17 1925  NA 147*  K 2.5*  CL 104  CO2 31  GLUCOSE 153*  BUN 41*  CREATININE 1.05*  CALCIUM 10.1   GFR: Estimated Creatinine Clearance: 40.8 mL/min (A) (by C-G formula based on SCr of 1.05 mg/dL (H)). Liver Function Tests: Recent Labs  Lab 08/28/17 1938  AST 29  ALT 20  ALKPHOS 59  BILITOT 0.8  PROT 6.2*  ALBUMIN 3.7   No results for  input(s): LIPASE, AMYLASE in the last 168 hours. Recent Labs  Lab 08/28/17 1925  AMMONIA 21   Coagulation Profile: No results for input(s): INR, PROTIME in the last 168 hours. Cardiac Enzymes: No results for input(s): CKTOTAL, CKMB, CKMBINDEX, TROPONINI in the last 168 hours. BNP (last 3 results) No results for input(s): PROBNP in the last 8760 hours. HbA1C: No results for input(s): HGBA1C in the last 72 hours. CBG: No results for input(s): GLUCAP in the last 168 hours. Lipid Profile: No results for input(s): CHOL, HDL, LDLCALC, TRIG, CHOLHDL, LDLDIRECT in the last 72 hours.  Thyroid Function Tests: No results for input(s): TSH, T4TOTAL, FREET4, T3FREE, THYROIDAB in the last 72 hours. Anemia Panel: No results for input(s): VITAMINB12, FOLATE, FERRITIN, TIBC, IRON, RETICCTPCT in the last 72 hours. Urine analysis:    Component Value Date/Time   COLORURINE YELLOW 08/28/2017 1925   APPEARANCEUR CLEAR 08/28/2017 1925   LABSPEC 1.024 08/28/2017 1925   PHURINE 6.0 08/28/2017 1925   GLUCOSEU 50 (A) 08/28/2017 1925   HGBUR NEGATIVE 08/28/2017 1925   BILIRUBINUR NEGATIVE 08/28/2017 1925   KETONESUR 5 (A) 08/28/2017 1925   PROTEINUR NEGATIVE 08/28/2017 1925   UROBILINOGEN 0.2 12/15/2006 1654   NITRITE NEGATIVE 08/28/2017 1925   LEUKOCYTESUR NEGATIVE 08/28/2017 1925   Sepsis Labs: @LABRCNTIP (procalcitonin:4,lacticidven:4) )No results found for this or any previous visit (from the past 240 hour(s)).   Radiological Exams on Admission: Dg Chest Port 1 View  Result Date: 08/28/2017 CLINICAL DATA:  67 year old female with altered mental status for 3 days. EXAM: PORTABLE CHEST 1 VIEW COMPARISON:  Portable chest 05/25/2017 and earlier. FINDINGS: Portable AP semi upright view at 2002 hours. Stable lung volumes and mediastinal contours. Allowing for portable technique the lungs are clear. Visualized tracheal air column is within normal limits. No acute osseous abnormality identified.  IMPRESSION: No acute cardiopulmonary abnormality. Electronically Signed   By: Odessa Fleming M.D.   On: 08/28/2017 20:52    EKG: Independently reviewed.  Normal sinus rhythm with a rate of 72.  Shortened PR interval.  No specific ST changes.  Assessment/Plan Principal Problem:   Hypokalemia Active Problems:   Depression   Hypercholesterolemia   Generalized anxiety disorder    #1 profound hypokalemia: Most likely secondary to poor oral intake.  No nausea vomiting or diarrhea.  Patient just anorexic.  This is related to her psychiatric illness.  We will replete potassium.  Check magnesium level.  Use IV route for now.  #2 anorexia: Related to her psychiatric illness.  Counseling to be provided.  Once medically stable patient will need psychiatric consultation.  #3 manic episodes: Patient had good responses to Haldol.  Will order Haldol as needed.  Psychiatric consult once medically stable.  #4 depression: Again continue with home regimen at this point.  Await psychiatric input  #5 hyperlipidemia: No change in home therapy.   DVT prophylaxis: Lovenox Code Status: Full Family Communication: No family available Disposition Plan: Home Consults called: None Admission status: Inpatient to telemetry Severity of Illness: The appropriate patient status for this patient is INPATIENT. Inpatient status is judged to be reasonable and necessary in order to provide the required intensity of service to ensure the patient's safety. The patient's presenting symptoms, physical exam findings, and initial radiographic and laboratory data in the context of their chronic comorbidities is felt to place them at high risk for further clinical deterioration. Furthermore, it is not anticipated that the patient will be medically stable for discharge from the hospital within 2 midnights of admission. The following factors support the patient status of inpatient.   " The patient's presenting symptoms include generalized  weakness. " The worrisome physical exam findings include depressed mood. " The initial radiographic and laboratory data are worrisome because of potassium of 2.5. " The chronic co-morbidities include bipolar disorder with manic episodes.   * I certify that at the point of admission it is my clinical judgment that the patient will require inpatient hospital care spanning beyond 2 midnights from the point of admission due to high intensity of service, high risk for further deterioration and high frequency  of surveillance required.Lonia Blood MD [facility] Pager [phone]  If 7PM-7AM, please contact night-coverage [email or web address] Password [email or web address]  08/28/2017, 10:30 PM

## 2017-08-28 NOTE — ED Notes (Signed)
Primary RN, Lillia AbedLindsay also made aware of Potassium level

## 2017-08-29 DIAGNOSIS — E43 Unspecified severe protein-calorie malnutrition: Secondary | ICD-10-CM

## 2017-08-29 LAB — COMPREHENSIVE METABOLIC PANEL
ALT: 19 U/L (ref 0–44)
AST: 34 U/L (ref 15–41)
Albumin: 3.8 g/dL (ref 3.5–5.0)
Alkaline Phosphatase: 67 U/L (ref 38–126)
Anion gap: 8 (ref 5–15)
BUN: 26 mg/dL — ABNORMAL HIGH (ref 8–23)
CO2: 29 mmol/L (ref 22–32)
Calcium: 9.6 mg/dL (ref 8.9–10.3)
Chloride: 108 mmol/L (ref 98–111)
Creatinine, Ser: 0.81 mg/dL (ref 0.44–1.00)
GFR calc Af Amer: >60 mL/min (ref >60)
GFR calc non Af Amer: >60 mL/min (ref >60)
Glucose, Bld: 87 mg/dL (ref 70–99)
Potassium: 3.8 mmol/L (ref 3.5–5.1)
Sodium: 145 mmol/L (ref 135–145)
Total Bilirubin: 1.1 mg/dL (ref 0.3–1.2)
Total Protein: 6.5 g/dL (ref 6.5–8.1)

## 2017-08-29 LAB — CBC
HCT: 40.7 % (ref 36.0–46.0)
HEMOGLOBIN: 13.4 g/dL (ref 12.0–15.0)
MCH: 29.8 pg (ref 26.0–34.0)
MCHC: 32.9 g/dL (ref 30.0–36.0)
MCV: 90.6 fL (ref 78.0–100.0)
Platelets: 263 10*3/uL (ref 150–400)
RBC: 4.49 MIL/uL (ref 3.87–5.11)
RDW: 13 % (ref 11.5–15.5)
WBC: 5.8 10*3/uL (ref 4.0–10.5)

## 2017-08-29 LAB — HIV ANTIBODY (ROUTINE TESTING W REFLEX): HIV Screen 4th Generation wRfx: NONREACTIVE

## 2017-08-29 MED ORDER — OXCARBAZEPINE 150 MG PO TABS
75.0000 mg | ORAL_TABLET | Freq: Two times a day (BID) | ORAL | Status: DC
  Administered 2017-08-29 – 2017-09-08 (×16): 75 mg via ORAL
  Filled 2017-08-29 (×21): qty 0.5

## 2017-08-29 MED ORDER — OLANZAPINE 5 MG PO TABS
5.0000 mg | ORAL_TABLET | Freq: Every day | ORAL | Status: DC
  Administered 2017-08-30 – 2017-09-07 (×9): 5 mg via ORAL
  Filled 2017-08-29 (×9): qty 1

## 2017-08-29 MED ORDER — OXCARBAZEPINE 150 MG PO TABS
150.0000 mg | ORAL_TABLET | Freq: Every day | ORAL | Status: DC
  Administered 2017-08-29 – 2017-09-07 (×10): 150 mg via ORAL
  Filled 2017-08-29 (×10): qty 1

## 2017-08-29 MED ORDER — ENSURE ENLIVE PO LIQD
237.0000 mL | Freq: Three times a day (TID) | ORAL | Status: DC
  Administered 2017-08-29 – 2017-09-08 (×17): 237 mL via ORAL

## 2017-08-29 MED ORDER — OLANZAPINE 5 MG PO TABS
5.0000 mg | ORAL_TABLET | Freq: Every day | ORAL | Status: DC
  Administered 2017-08-29: 5 mg via ORAL
  Filled 2017-08-29: qty 1

## 2017-08-29 NOTE — Consult Note (Signed)
[place] Psychiatry Consult   Reason for Consult:  Depression and anorexia  Referring Physician:  Dr. Posey Pronto  Patient Identification: Amanda Hall MRN:  [id number] Principal Diagnosis: MDD (major depressive disorder), recurrent severe, without psychosis ([place]) Diagnosis:   Patient Active Problem List   Diagnosis Date Noted  . Hypokalemia [E87.6] 08/28/2017  . Generalized anxiety disorder [F41.1] 05/26/2017  . Major depressive disorder, recurrent severe without psychotic features ([place]) [F33.2] 05/26/2017  . Hypercholesterolemia [E78.00] 03/22/2017  . Nonspecific (abnormal) findings on radiological and other examination of body structure [793] 12/26/2006  . NONSPECIFIC ABNORM FIND RAD&OTH EXAM LUNG FIELD [R93.0] 12/26/2006  . ANEMIA-IRON DEFICIENCY [D50.9] 12/15/2006  . Depression [F32.9] 12/15/2006  . Allergic rhinitis [J30.9] 12/15/2006    Total Time spent with patient: 1 hour  Subjective:   Amanda Hall is a 67 y.o. female patient admitted with hypokalemia secondary to poor PO intake.  HPI:   Per chart review, patient was admitted with hypokalemia (potassium 2.5 on admission) in the setting of poor PO intake and generalized weakness for 4 days. She was reportedly agitated and exhibiting manic symptoms prior to arrival to the hospital. She was given Versed and Haldol. She has a history of depression and anxiety. She was last seen at [facility] on 6/4 for anxiety and delusions in the setting of a UTI and poor medication compliance. She was previously at [facility] and recommended for discharge to a SNF but her family declined. She was discharged from [facility] on 5/31. Discharge medications included Luvox 150 mg daily, Ativan 1 mg BID, Remeron 45 mg qhs, Trileptal 75 mg BID and 150 mg qhs and Seroquel 50 mg qhs. Seroquel was switched to Zyprexa in the hospital. She received Haldol 5 mg this morning for agitation.   On interview, [name] reports "I do not know" when asked what brings her to the hospital.   She reports that she has been feeling anxious and reports that her anxiety has worsened.  She also reports feeling depressed.  She endorses depression and anxiety for several months.  She reports poor energy and poor appetite.  She denies problems with sleep.  She reports, "No I am just terrified.  Everything is shutting down" when asked about SI.  She denies HI or AVH.  She reports needing assistance with her ADLs now although her sister is unable to help her.  She is oriented to person, place and time.  Past Psychiatric History: MDD  Risk to Self:  Yes given severe depression and inability to care for self.  Risk to Others:  None. Denies HI.  Prior Inpatient Therapy:  She was admitted to [facility] for anxiety and anxoria (20-30 lb weight over a few weeks) in 05/2017.  Prior Outpatient Therapy:  She is followed by her PCP, Dr. Bluford Kaufmann.  Past Medical History:  Past Medical History:  Diagnosis Date  . Allergy   . Anemia   . Anxiety   . Depression   . Hemorrhoids     Past Surgical History:  Procedure Laterality Date  . arm surgery     Left  . TUBAL LIGATION     Family History:  Family History  Problem Relation Age of Onset  . Stroke Mother   . Hypertension Sister   . Cancer Neg Hx        breat and colon hx  . Diabetes Neg Hx        family   Family Psychiatric  History: Grandfather-depression and committed suicide.   Social  History:  Social History   Substance and Sexual Activity  Alcohol Use No     Social History   Substance and Sexual Activity  Drug Use No    Social History   Socioeconomic History  . Marital status: Divorced    Spouse name: Not on file  . Number of children: Not on file  . Years of education: Not on file  . Highest education level: Not on file  Occupational History  . Not on file  Social Needs  . Financial resource strain: Not on file  . Food insecurity:    Worry: Not on file    Inability: Not on file  . Transportation needs:     Medical: Not on file    Non-medical: Not on file  Tobacco Use  . Smoking status: Never Smoker  . Smokeless tobacco: Never Used  Substance and Sexual Activity  . Alcohol use: No  . Drug use: No  . Sexual activity: Not Currently  Lifestyle  . Physical activity:    Days per week: Not on file    Minutes per session: Not on file  . Stress: Not on file  Relationships  . Social connections:    Talks on phone: Not on file    Gets together: Not on file    Attends religious service: Not on file    Active member of club or organization: Not on file    Attends meetings of clubs or organizations: Not on file    Relationship status: Not on file  Other Topics Concern  . Not on file  Social History Narrative  . Not on file   Additional Social History: She lives at home with her sister. She denies alcohol or illicit substance use.     Allergies:   Allergies  Allergen Reactions  . Codeine Other (See Comments)    "makes head fell crazy"  . Sulfonamide Derivatives Nausea Only  . Tetanus Toxoid Other (See Comments)    "Made a Knot"    Labs:  Results for orders placed or performed during the hospital encounter of 08/28/17 (from the past 48 hour(s))  CBC with Differential     Status: Abnormal   Collection Time: 08/28/17  7:25 PM  Result Value Ref Range   WBC 4.1 4.0 - 10.5 K/uL   RBC 3.99 3.87 - 5.11 MIL/uL   Hemoglobin 12.0 12.0 - 15.0 g/dL   HCT 35.7 (L) 36.0 - 46.0 %   MCV 89.5 78.0 - 100.0 fL   MCH 30.1 26.0 - 34.0 pg   MCHC 33.6 30.0 - 36.0 g/dL   RDW 13.0 11.5 - 15.5 %   Platelets 254 150 - 400 K/uL   Neutrophils Relative % 75 %   Neutro Abs 3.1 1.7 - 7.7 K/uL   Lymphocytes Relative 16 %   Lymphs Abs 0.6 (L) 0.7 - 4.0 K/uL   Monocytes Relative 9 %   Monocytes Absolute 0.4 0.1 - 1.0 K/uL   Eosinophils Relative 0 %   Eosinophils Absolute 0.0 0.0 - 0.7 K/uL   Basophils Relative 0 %   Basophils Absolute 0.0 0.0 - 0.1 K/uL    Comment: Performed at [facility], [place] [address] [place], [place] [address]  Basic metabolic panel     Status: Abnormal   Collection Time: 08/28/17  7:25 PM  Result Value Ref Range   Sodium 147 (H) 135 - 145 mmol/L   Potassium 2.5 (LL) 3.5 - 5.1 mmol/L    Comment:  CRITICAL RESULT CALLED TO, READ BACK BY AND VERIFIED WITH: DOSTER T RN 2019 08/28/2017 HILL K    Chloride 104 98 - 111 mmol/L   CO2 31 22 - 32 mmol/L   Glucose, Bld 153 (H) 70 - 99 mg/dL   BUN 41 (H) 8 - 23 mg/dL   Creatinine, Ser 1.05 (H) 0.44 - 1.00 mg/dL   Calcium 10.1 8.9 - 10.3 mg/dL   GFR calc non Af Amer 54 (L) >60 mL/min   GFR calc Af Amer >60 >60 mL/min    Comment: (NOTE) The eGFR has been calculated using the CKD EPI equation. This calculation has not been validated in all clinical situations. eGFR's persistently <60 mL/min signify possible Chronic Kidney Disease.    Anion gap 12 5 - 15    Comment: Performed at [facility], [place] [address] [place], [place] [address]  Ammonia     Status: None   Collection Time: 08/28/17  7:25 PM  Result Value Ref Range   Ammonia 21 9 - 35 umol/L    Comment: Performed at [facility], [place] [address], [place], [place] [address]  Urinalysis, Routine w reflex microscopic     Status: Abnormal   Collection Time: 08/28/17  7:25 PM  Result Value Ref Range   Color, Urine YELLOW YELLOW   APPearance CLEAR CLEAR   Specific Gravity, Urine 1.024 1.005 - 1.030   pH 6.0 5.0 - 8.0   Glucose, UA 50 (A) NEGATIVE mg/dL   Hgb urine dipstick NEGATIVE NEGATIVE   Bilirubin Urine NEGATIVE NEGATIVE   Ketones, ur 5 (A) NEGATIVE mg/dL   Protein, ur NEGATIVE NEGATIVE mg/dL   Nitrite NEGATIVE NEGATIVE   Leukocytes, UA NEGATIVE NEGATIVE    Comment: Performed at [place] [address] [place], [place] [address]  Hepatic function panel     Status: Abnormal   Collection Time: 08/28/17  7:38 PM  Result Value Ref Range   Total Protein 6.2 (L) 6.5 - 8.1 g/dL    Albumin 3.7 3.5 - 5.0 g/dL   AST 29 15 - 41 U/L   ALT 20 0 - 44 U/L   Alkaline Phosphatase 59 38 - 126 U/L   Total Bilirubin 0.8 0.3 - 1.2 mg/dL   Bilirubin, Direct 0.1 0.0 - 0.2 mg/dL   Indirect Bilirubin 0.7 0.3 - 0.9 mg/dL    Comment: Performed at [facility], [place] [address], [place], [place] [address]  Acetaminophen level     Status: Abnormal   Collection Time: 08/28/17  7:38 PM  Result Value Ref Range   Acetaminophen (Tylenol), Serum <10 (L) 10 - 30 ug/mL    Comment: (NOTE) Therapeutic concentrations vary significantly. A range of 10-30 ug/mL  may be an effective concentration for many patients. However, some  are best treated at concentrations outside of this range. Acetaminophen concentrations >150 ug/mL at 4 hours after ingestion  and >50 ug/mL at 12 hours after ingestion are often associated with  toxic reactions. Performed at [facility], [place] [address], [place],  [address]   Salicylate level     Status: None   Collection Time: 08/28/17  7:38 PM  Result Value Ref Range   Salicylate Lvl <7.4 2.8 - 30.0 mg/dL    Comment: Performed at [facility], [place] [address] [place],  [address]  I-Stat CG4 Lactic Acid, ED     Status: None   Collection Time: 08/28/17  7:42 PM  Result Value Ref Range   Lactic Acid, Venous 0.91 0.5 - 1.9 mmol/L  Blood gas, venous     Status: Abnormal   Collection Time: 08/28/17  7:44 PM  Result Value Ref Range   pH, Ven 7.494 (H) 7.250 - 7.430   pCO2, Ven 45.6 44.0 - 60.0 mmHg   pO2, Ven 85.7 (H) 32.0 - 45.0 mmHg   Bicarbonate 34.8 (H) 20.0 - 28.0 mmol/L   Acid-Base Excess 10.4 (H) 0.0 - 2.0 mmol/L   O2 Saturation 96.5 %   Patient temperature 98.6    Collection site VEIN    Drawn by  COLLECTED BY RN    Sample type VEIN     Comment: Performed at [place] [address], [place], [place] [address]  Comprehensive metabolic panel     Status: Abnormal    Collection Time: 08/29/17  5:22 AM  Result Value Ref Range   Sodium 145 135 - 145 mmol/L   Potassium 3.8 3.5 - 5.1 mmol/L    Comment: DELTA CHECK NOTED REPEATED TO VERIFY NO VISIBLE HEMOLYSIS    Chloride 108 98 - 111 mmol/L   CO2 29 22 - 32 mmol/L   Glucose, Bld 87 70 - 99 mg/dL   BUN 26 (H) 8 - 23 mg/dL   Creatinine, Ser 0.81 0.44 - 1.00 mg/dL   Calcium 9.6 8.9 - 10.3 mg/dL   Total Protein 6.5 6.5 - 8.1 g/dL   Albumin 3.8 3.5 - 5.0 g/dL   AST 34 15 - 41 U/L   ALT 19 0 - 44 U/L   Alkaline Phosphatase 67 38 - 126 U/L   Total Bilirubin 1.1 0.3 - 1.2 mg/dL   GFR calc non Af Amer >60 >60 mL/min   GFR calc Af Amer >60 >60 mL/min    Comment: (NOTE) The eGFR has been calculated using the CKD EPI equation. This calculation has not been validated in all clinical situations. eGFR's persistently <60 mL/min signify possible Chronic Kidney Disease.    Anion gap 8 5 - 15    Comment: Performed at [facility], [place] [address] [place], [place] [address]  CBC     Status: None   Collection Time: 08/29/17  5:22 AM  Result Value Ref Range   WBC 5.8 4.0 - 10.5 K/uL   RBC 4.49 3.87 - 5.11 MIL/uL   Hemoglobin 13.4 12.0 - 15.0 g/dL   HCT 40.7 36.0 - 46.0 %   MCV 90.6 78.0 - 100.0 fL   MCH 29.8 26.0 - 34.0 pg   MCHC 32.9 30.0 - 36.0 g/dL   RDW 13.0 11.5 - 15.5 %   Platelets 263 150 - 400 K/uL    Comment: Performed at [facility], [place] [address], [place], [place] [address]    Current Facility-Administered Medications  Medication Dose Route Frequency Provider Last Rate Last Dose  . 0.9 % NaCl with KCl 40 mEq / L  infusion   Intravenous Continuous Lavina Hamman, MD 75 mL/hr at 08/29/17 1011 75 mL/hr at 08/29/17 1011  . atorvastatin (LIPITOR) tablet 20 mg  20 mg Oral Daily Gala Romney L, MD   20 mg at 08/29/17 1001  . enoxaparin (LOVENOX) injection 40 mg  40 mg Subcutaneous QHS Gala Romney L, MD   40 mg at 08/28/17 2359  . fluvoxaMINE (LUVOX)  tablet 150 mg  150 mg Oral QHS Garba, Mohammad L, MD      . haloperidol (HALDOL) tablet 5 mg  5 mg Oral Q6H PRN Elwyn Reach, MD   5 mg at 08/29/17 0516   Or  . haloperidol lactate (HALDOL) injection 2 mg  2 mg Intramuscular Q6H PRN Gala Romney L, MD      . LORazepam (ATIVAN) tablet 1 mg  1 mg Oral BID Gala Romney L, MD   1 mg at 08/29/17 1001  . mirtazapine (REMERON) tablet 45 mg  45 mg Oral QHS Gala Romney L, MD      . OLANZapine (ZYPREXA) tablet 5 mg  5 mg Oral Daily Lavina Hamman, MD   5 mg at 08/29/17 1001  . OXcarbazepine (TRILEPTAL) tablet 75 mg  75 mg Oral BID Gala Romney L, MD   75 mg at 08/29/17 1002  . polyethylene glycol (MIRALAX / GLYCOLAX) packet 17 g  17 g Oral Daily PRN Elwyn Reach, MD        Musculoskeletal: Strength & Muscle Tone: decreased due to physical deconditioning.  Gait & Station: unable to stand Patient leans: N/A  Psychiatric Specialty Exam: Physical Exam  Nursing note and vitals reviewed. Constitutional: She is oriented to person, place, and time. She appears well-developed.  Thin  HENT:  Head: Normocephalic and atraumatic.  Neck: Normal range of motion.  Respiratory: Effort normal.  Musculoskeletal: Normal range of motion.  Neurological: She is alert and oriented to person, place, and time.  Skin: No rash noted.  Psychiatric: Judgment and thought content normal. Her speech is delayed. She is slowed and withdrawn. Cognition and memory are normal. She exhibits a depressed mood.    Review of Systems  Cardiovascular: Negative for chest pain.  Gastrointestinal: Negative for abdominal pain, constipation, diarrhea, nausea and vomiting.  Psychiatric/Behavioral: Positive for depression. Negative for hallucinations, substance abuse and suicidal ideas. The patient is nervous/anxious. The patient does not have insomnia.   All other systems reviewed and are negative.   Blood pressure (!) 159/99, pulse 86, temperature 98 F (36.7 C),  temperature source Oral, resp. rate 16, weight 49 kg, SpO2 99 %.Body mass index is 16.92 kg/m.  General Appearance: Fairly Groomed, elderly, thin, Caucasian female, wearing a hospital gown with long, gray hair in a ponytail who is lying in bed with her eyes closed. NAD.   Eye Contact:  None  Speech:  Clear and Coherent and Slow  Volume:  Decreased  Mood:  Anxious and Depressed  Affect:  Congruent and Depressed  Thought Process:  Linear and Descriptions of Associations: Intact  Orientation:  Full (Time, Place, and Person)  Thought Content:  Logical  Suicidal Thoughts:  No  Homicidal Thoughts:  No  Memory:  Immediate;   Fair Recent;   Fair Remote;   Fair  Judgement:  Fair  Insight:  Poor  Psychomotor Activity:  Psychomotor Retardation  Concentration:  Concentration: Fair and Attention Span: Fair  Recall:  [employer] of Knowledge:  Fair  Language:  Fair  Akathisia:  No  Handed:  Right  AIMS (if indicated):   N/A  Assets:  Housing Social Support  ADL's:  Impaired  Cognition:  WNL  Sleep:   Okay   Assessment:  Kimberlea Schlag is a 67 y.o. female who was admitted with significant hypokalemia due to poor PO intake. She reports worsening depression and anxiety over an unknown time period. It appears that she has further declined since admission to [facility] at the end of May. She endorses poor appetite with weight loss and requiring more assistance with ADLs. She has psychomotor retardation  and slowed speech and responses to questions on exam. She warrants inpatient psychiatric hospitalization for stabilization and treatment. She may require SNF placement following inpatient psychiatric hospitalization which was recommended at last hospitalization but patient and family declined.   Treatment Plan Summary: -Continue Luvox 150 mg daily for depression and anxiety, Ativan 1 mg TID for anxiety, Remeron 45 mg qhs for depression and anxiety, Trileptal 75 mg BID and 150 mg qhs for mood stabilization and  Zyprexa 5 mg daily (will recommend switching to nighttime dosing to avoid daytime sedation). Seroquel was switched to Zyprexa given poor appetite.  -Patient warrants inpatient psychiatric hospitalization given severe depression which impairs daily functioning and has not been adequately treated with outpatient services.  -Please pursue involuntary commitment if patient refuses voluntary psychiatric hospitalization or attempts to leave the hospital.  -Will sign off on patient at this time. Please consult psychiatry again as needed.    Disposition: Recommend psychiatric Inpatient admission when medically cleared.  Faythe Dingwall, DO 08/29/2017 10:52 AM

## 2017-08-29 NOTE — Progress Notes (Signed)
Initial Nutrition Assessment  DOCUMENTATION CODES:   Severe malnutrition in context of chronic illness, Underweight  INTERVENTION:   - Ensure Enlive po TID, each supplement provides 350 kcal and 20 grams of protein  - Recommend MVI with minerals daily  Monitor magnesium, potassium, and phosphorus daily for at least 3 days, MD to replete as needed, as pt is at risk for refeeding syndrome given severe protein-calorie malnutrition and little to no PO intake x 3-4 days PTA.  NUTRITION DIAGNOSIS:   Severe Malnutrition related to chronic illness (depression, anxiety, bipolar disorder) as evidenced by mild fat depletion, moderate fat depletion, mild muscle depletion, moderate muscle depletion, severe muscle depletion, percent weight loss (19.9% weight loss in < 5 months).  GOAL:   Patient will meet greater than or equal to 90% of their needs  MONITOR:   PO intake, Supplement acceptance, Labs, Weight trends, Skin  REASON FOR ASSESSMENT:   Malnutrition Screening Tool    ASSESSMENT:   67 year old female who presented to the ED with manic behavior. PMH significant for depression, anxiety, and bipolar disorder.  Discussed pt with RN. RN states that they attempted to feed pt breakfast but that she only took a couple of bits. Per RN, pt is only saying that she is scared and is not providing other information.  RD attempted to wake pt but pt did not awaken to touch or voice. Pt shifted in bed to touch and groaned but did not open her eyes. No family present at time of visit so unable to obtain a more thorough diet and weight history. Per H&P, pt had not eaten or had anything to drink for 3-4 days PTA.  Per weight history in chart, pt has lost 26.8 lbs since 04/17/17. This is a 19.9% weight loss in less than 5 months which is significant for timeframe.  Medications reviewed and include: 35 mg Remeron daily, IV KCl  Labs reviewed: BUN 26 (H)  NUTRITION - FOCUSED PHYSICAL EXAM:    Most  Recent Value  Orbital Region  Mild depletion  Upper Arm Region  Moderate depletion  Thoracic and Lumbar Region  Moderate depletion  Buccal Region  No depletion  Temple Region  Moderate depletion  Clavicle Bone Region  Severe depletion  Clavicle and Acromion Bone Region  Severe depletion  Scapular Bone Region  Unable to assess  Dorsal Hand  Mild depletion  Patellar Region  Moderate depletion  Anterior Thigh Region  Moderate depletion  Posterior Calf Region  Mild depletion  Edema (RD Assessment)  Moderate [BLE]  Hair  Reviewed  Eyes  Unable to assess  Mouth  Unable to assess  Skin  Reviewed  Nails  Reviewed       Diet Order:   Diet Order            Diet regular Room service appropriate? Yes; Fluid consistency: Thin  Diet effective now              EDUCATION NEEDS:   Not appropriate for education at this time  Skin:  Skin Assessment: Reviewed RN Assessment (ecchymosis to arm, leg, hip)  Last BM:  unknown/PTA  Height:   Ht Readings from Last 1 Encounters:  05/26/17 5\' 7"  (1.702 m)    Weight:   Wt Readings from Last 1 Encounters:  08/28/17 49 kg    Ideal Body Weight:  61.36 kg  BMI:  Body mass index is 16.92 kg/m.  Estimated Nutritional Needs:   Kcal:  1500-1700  Protein:  75-90  grams  Fluid:  >/= 1.5 L    Earma ReadingKate Jablonski Cherryl Babin, MS, RD, LDN Pager: [phone] Weekend/After Hours: [phone]

## 2017-08-29 NOTE — Progress Notes (Signed)
[facility] Progress Note  Patient: Amanda Hall VQQ:[id number]   PCP: Gordy SaversKwiatkowski, Peter F, MD DOB: 05-May-1950   DOA: 08/28/2017   DOS: 08/29/2017   Date of Service: the patient was seen and examined on 08/29/2017  Subjective: She is afraid of everything.  No nausea no vomiting.  No abdominal pain no chest pain.  Brief hospital course: Pt. with PMH of depression, anxiety, bipolar disorder; admitted on 08/28/2017, presented with complaint of agitation, was found to have hypokalemia. Currently further plan is continue current management.  Assessment and Plan: 1.  Hypokalemia. Poor p.o. intake. Potassium is improved.  P.o. intake has not improved.  Will monitor.  2.  Depression. Manic episodes. Anorexia. Psychiatric consider appreciate input. Patient change from Seroquel to Zyprexa. Patient will require geriatric inpatient psych followed by SNF.  3.  Severe protein calorie malnutrition. Appreciate dietary consultation.  Monitor.   Diet: Regular diet DVT Prophylaxis: subcutaneous Heparin Advance goals of care discussion: full code  Family Communication: no family was present at bedside, at the time of interview.   [facility]:  [facility]  Consultants: psych Procedures: none  Antibiotics: Anti-infectives (From admission, onward)   None       Objective: Physical Exam: Vitals:   08/28/17 2203 08/29/17 0535 08/29/17 0841 08/29/17 1302  BP: 131/73 (!) 159/99  131/68  Pulse: 63 86  81  Resp: 14 16    Temp: 97.7 F (36.5 C) 98 F (36.7 C)    TempSrc: Oral Oral    SpO2: 100% 100% 99% 100%  Weight: 49 kg       Intake/Output Summary (Last 24 hours) at 08/29/2017 1823 Last data filed at 08/29/2017 1701 Gross per 24 hour  Intake 4071.35 ml  Output 1900 ml  Net 2171.35 ml   Filed Weights   08/28/17 2203  Weight: 49 kg   General: Alert, Awake and Oriented to Time, Place and Person. Appear in moderate distress, affect rritable Eyes: PERRL,  Conjunctiva normal ENT: Oral Mucosa clear moist Neck:  No JVD, no Abnormal Mass Or lumps Cardiovascular: S1 and S2 Present, no Murmur, Peripheral Pulses Present Respiratory: normal respiratory effort, Bilateral Air entry equal and Decreased, no use of accessory muscle, Clear to Auscultation, no Crackles, no wheezes Abdomen: Bowel Sound present, Soft and no tenderness, no hernia Skin: no redness, no Rash, no induration Extremities: no Pedal edema, no calf tenderness Neurologic: Grossly no focal neuro deficit. Bilaterally Equal motor strength  Data Reviewed: CBC: Recent Labs  Lab 08/28/17 1925 08/29/17 0522  WBC 4.1 5.8  NEUTROABS 3.1  --   HGB 12.0 13.4  HCT 35.7* 40.7  MCV 89.5 90.6  PLT 254 263   Basic Metabolic Panel: Recent Labs  Lab 08/28/17 1925 08/29/17 0522  NA 147* 145  K 2.5* 3.8  CL 104 108  CO2 31 29  GLUCOSE 153* 87  BUN 41* 26*  CREATININE 1.05* 0.81  CALCIUM 10.1 9.6    Liver Function Tests: Recent Labs  Lab 08/28/17 1938 08/29/17 0522  AST 29 34  ALT 20 19  ALKPHOS 59 67  BILITOT 0.8 1.1  PROT 6.2* 6.5  ALBUMIN 3.7 3.8   No results for input(s): LIPASE, AMYLASE in the last 168 hours. Recent Labs  Lab 08/28/17 1925  AMMONIA 21   Coagulation Profile: No results for input(s): INR, PROTIME in the last 168 hours. Cardiac Enzymes: No results for input(s): CKTOTAL, CKMB, CKMBINDEX, TROPONINI in the last 168 hours. BNP (last 3 results) No results for  input(s): PROBNP in the last 8760 hours. CBG: No results for input(s): GLUCAP in the last 168 hours. Studies: Dg Chest Port 1 View  Result Date: 08/28/2017 CLINICAL DATA:  67 year old female with altered mental status for 3 days. EXAM: PORTABLE CHEST 1 VIEW COMPARISON:  Portable chest 05/25/2017 and earlier. FINDINGS: Portable AP semi upright view at 2002 hours. Stable lung volumes and mediastinal contours. Allowing for portable technique the lungs are clear. Visualized tracheal air column is  within normal limits. No acute osseous abnormality identified. IMPRESSION: No acute cardiopulmonary abnormality. Electronically Signed   By: Odessa FlemingH  Hall M.D.   On: 08/28/2017 20:52    Scheduled Meds: . atorvastatin  20 mg Oral Daily  . enoxaparin (LOVENOX) injection  40 mg Subcutaneous QHS  . feeding supplement (ENSURE ENLIVE)  237 mL Oral TID BM  . fluvoxaMINE  150 mg Oral QHS  . LORazepam  1 mg Oral BID  . mirtazapine  45 mg Oral QHS  . [START ON 08/30/2017] OLANZapine  5 mg Oral QHS  . OXcarbazepine  150 mg Oral QHS  . OXcarbazepine  75 mg Oral BID   Continuous Infusions: . 0.9 % NaCl with KCl 40 mEq / L 75 mL/hr (08/29/17 1736)   PRN Meds: haloperidol **OR** haloperidol lactate, polyethylene glycol  Time spent: 35 minutes  Author: Lynden OxfordPranav Amel Kitch, MD [facility] Pager: [phone] 08/29/2017 6:23 PM  If 7PM-7AM, please contact night-coverage at [email or web address], password [facility]

## 2017-08-30 LAB — COMPREHENSIVE METABOLIC PANEL
ALT: 19 U/L (ref 0–44)
AST: 30 U/L (ref 15–41)
Albumin: 3.8 g/dL (ref 3.5–5.0)
Alkaline Phosphatase: 71 U/L (ref 38–126)
Anion gap: 11 (ref 5–15)
BUN: 13 mg/dL (ref 8–23)
CHLORIDE: 100 mmol/L (ref 98–111)
CO2: 29 mmol/L (ref 22–32)
CREATININE: 0.81 mg/dL (ref 0.44–1.00)
Calcium: 9.8 mg/dL (ref 8.9–10.3)
GFR calc non Af Amer: >60 mL/min (ref >60)
Glucose, Bld: 97 mg/dL (ref 70–99)
Potassium: 3.8 mmol/L (ref 3.5–5.1)
SODIUM: 140 mmol/L (ref 135–145)
Total Bilirubin: 1.3 mg/dL — ABNORMAL HIGH (ref 0.3–1.2)
Total Protein: 6.5 g/dL (ref 6.5–8.1)

## 2017-08-30 LAB — CBC WITH DIFFERENTIAL/PLATELET
BASOS PCT: 0 %
Basophils Absolute: 0 10*3/uL (ref 0.0–0.1)
EOS ABS: 0 10*3/uL (ref 0.0–0.7)
EOS PCT: 0 %
HCT: 40.7 % (ref 36.0–46.0)
Hemoglobin: 13.8 g/dL (ref 12.0–15.0)
Lymphocytes Relative: 19 %
Lymphs Abs: 1.3 10*3/uL (ref 0.7–4.0)
MCH: 29.9 pg (ref 26.0–34.0)
MCHC: 33.9 g/dL (ref 30.0–36.0)
MCV: 88.1 fL (ref 78.0–100.0)
Monocytes Absolute: 0.4 10*3/uL (ref 0.1–1.0)
Monocytes Relative: 6 %
Neutro Abs: 4.9 10*3/uL (ref 1.7–7.7)
Neutrophils Relative %: 75 %
PLATELETS: 257 10*3/uL (ref 150–400)
RBC: 4.62 MIL/uL (ref 3.87–5.11)
RDW: 12.7 % (ref 11.5–15.5)
WBC: 6.6 10*3/uL (ref 4.0–10.5)

## 2017-08-30 LAB — PREALBUMIN: Prealbumin: 18.5 mg/dL (ref 18–38)

## 2017-08-30 LAB — PHOSPHORUS: PHOSPHORUS: 2.6 mg/dL (ref 2.5–4.6)

## 2017-08-30 LAB — MAGNESIUM: Magnesium: 1.7 mg/dL (ref 1.7–2.4)

## 2017-08-30 MED ORDER — KCL IN DEXTROSE-NACL 20-5-0.45 MEQ/L-%-% IV SOLN
INTRAVENOUS | Status: DC
  Administered 2017-08-30 – 2017-09-01 (×5): via INTRAVENOUS
  Filled 2017-08-30 (×8): qty 1000

## 2017-08-30 MED ORDER — LORAZEPAM 1 MG PO TABS
1.0000 mg | ORAL_TABLET | Freq: Once | ORAL | Status: AC
  Administered 2017-08-30: 1 mg via ORAL
  Filled 2017-08-30: qty 2

## 2017-08-30 MED ORDER — SODIUM CHLORIDE 0.9 % IV BOLUS
250.0000 mL | Freq: Once | INTRAVENOUS | Status: AC
  Administered 2017-08-30: 250 mL via INTRAVENOUS

## 2017-08-30 NOTE — Progress Notes (Signed)
[facility] Progress Note  Patient: Amanda Hall ZOX:[id number]   PCP: Gordy SaversKwiatkowski, Peter F, MD DOB: 08-Aug-1950   DOA: 08/28/2017   DOS: 08/30/2017   Date of Service: the patient was seen and examined on 08/30/2017  Subjective: Patient is sleepy and lethargic.  Not able to follow commands today.  Per RN patient has not slept at all in the last 4 days.  Brief hospital course: Pt. with PMH of depression, anxiety, bipolar disorder; admitted on 08/28/2017, presented with complaint of agitation, was found to have hypokalemia. Currently further plan is continue current management.  Assessment and Plan: 1.  Hypokalemia. Poor p.o. intake. Potassium is improved.  P.o. intake has not improved.  Will monitor.  2.  Depression. Manic episodes. Anorexia. Psychiatric consider appreciate input. Patient change from Seroquel to Zyprexa. Patient will require geriatric inpatient psych followed by SNF.  3.  Severe protein calorie malnutrition. Appreciate dietary consultation.  Monitor.  4.  Excessive sleepiness. Currently continue to monitor on telemetry this is all likely secondary to chronic sleep sleep deprived status.  If no improvement will require further work-up.  Patient is easily arousable on my exam.  Diet: Regular diet DVT Prophylaxis: subcutaneous Heparin Advance goals of care discussion: full code  Family Communication: no family was present at bedside, at the time of interview.   [facility]:  [facility]  Consultants: psych Procedures: none  Antibiotics: Anti-infectives (From admission, onward)   None       Objective: Physical Exam: Vitals:   08/29/17 1302 08/29/17 2100 08/30/17 0656 08/30/17 1435  BP: 131/68 (!) 149/81 (!) 157/82 (!) 151/90  Pulse: 81 85 92 98  Resp:  20 20 20   Temp:  98.8 F (37.1 C) 98 F (36.7 C) 98.1 F (36.7 C)  TempSrc:  Oral Oral Oral  SpO2: 100% 100% 98% 100%  Weight:        Intake/Output Summary (Last 24 hours) at  08/30/2017 1620 Last data filed at 08/30/2017 1615 Gross per 24 hour  Intake 795 ml  Output 1750 ml  Net -955 ml   Filed Weights   08/28/17 2203  Weight: 49 kg   General: Alert, Awake and Oriented to Time, Place and Person. Appear in moderate distress, affect rritable Eyes: PERRL, Conjunctiva normal ENT: Oral Mucosa clear moist Neck:  No JVD, no Abnormal Mass Or lumps Cardiovascular: S1 and S2 Present, no Murmur, Peripheral Pulses Present Respiratory: normal respiratory effort, Bilateral Air entry equal and Decreased, no use of accessory muscle, Clear to Auscultation, no Crackles, no wheezes Abdomen: Bowel Sound present, Soft and no tenderness, no hernia Skin: no redness, no Rash, no induration Extremities: no Pedal edema, no calf tenderness Neurologic: Grossly no focal neuro deficit. Bilaterally Equal motor strength  Data Reviewed: CBC: Recent Labs  Lab 08/28/17 1925 08/29/17 0522 08/30/17 1015  WBC 4.1 5.8 6.6  NEUTROABS 3.1  --  4.9  HGB 12.0 13.4 13.8  HCT 35.7* 40.7 40.7  MCV 89.5 90.6 88.1  PLT 254 263 257   Basic Metabolic Panel: Recent Labs  Lab 08/28/17 1925 08/29/17 0522 08/30/17 1015  NA 147* 145 140  K 2.5* 3.8 3.8  CL 104 108 100  CO2 31 29 29   GLUCOSE 153* 87 97  BUN 41* 26* 13  CREATININE 1.05* 0.81 0.81  CALCIUM 10.1 9.6 9.8  MG  --   --  1.7  PHOS  --   --  2.6    Liver Function Tests: Recent Labs  Lab 08/28/17  1938 08/29/17 0522 08/30/17 1015  AST 29 34 30  ALT 20 19 19   ALKPHOS 59 67 71  BILITOT 0.8 1.1 1.3*  PROT 6.2* 6.5 6.5  ALBUMIN 3.7 3.8 3.8   No results for input(s): LIPASE, AMYLASE in the last 168 hours. Recent Labs  Lab 08/28/17 1925  AMMONIA 21   Coagulation Profile: No results for input(s): INR, PROTIME in the last 168 hours. Cardiac Enzymes: No results for input(s): CKTOTAL, CKMB, CKMBINDEX, TROPONINI in the last 168 hours. BNP (last 3 results) No results for input(s): PROBNP in the last 8760 hours. CBG: No  results for input(s): GLUCAP in the last 168 hours. Studies: No results found.  Scheduled Meds: . atorvastatin  20 mg Oral Daily  . enoxaparin (LOVENOX) injection  40 mg Subcutaneous QHS  . feeding supplement (ENSURE ENLIVE)  237 mL Oral TID BM  . fluvoxaMINE  150 mg Oral QHS  . mirtazapine  45 mg Oral QHS  . OLANZapine  5 mg Oral QHS  . OXcarbazepine  150 mg Oral QHS  . OXcarbazepine  75 mg Oral BID   Continuous Infusions: . dextrose 5 % and 0.45 % NaCl with KCl 20 mEq/L     PRN Meds: haloperidol **OR** haloperidol lactate, polyethylene glycol  Time spent: 35 minutes  Author: Lynden OxfordPranav Bryonna Sundby, MD [facility] Pager: [phone] 08/30/2017 4:20 PM  If 7PM-7AM, please contact night-coverage at [email or web address], password [email or web address]

## 2017-08-30 NOTE — Progress Notes (Signed)
Have attempted multiple times today to assess patient but patient is very lethargic. She does arouse to voice but is not responding verbally.  Patient' sister called to check on her and when RN told her she was very lethargic today she said "I'm not surprised, she was awake for 4 days straight." According to patient's nurse from last night, she was highly anxious, pulling at her telemetry leads, and calling out frequently. Informed MD of patient's mental status change and that RN was unable to safely administer medications. Will continue to monitor

## 2017-08-30 NOTE — Progress Notes (Signed)
LCSW following for inpatient psych placement when patient is medically stable.   Amanda GandyBernette Lesslie Hall, LSCW [facility] [phone]

## 2017-08-30 NOTE — Progress Notes (Signed)
Writer notified Dr. Clearence PedSchorr d/t new MEWS score. Pt very anxious tonight so b/p and HR elevated.

## 2017-08-30 NOTE — Progress Notes (Signed)
[facility] RN I agree with assessment and no acute changes noted with Pt's assessment at this time

## 2017-08-31 DIAGNOSIS — E43 Unspecified severe protein-calorie malnutrition: Secondary | ICD-10-CM

## 2017-08-31 DIAGNOSIS — E7800 Pure hypercholesterolemia, unspecified: Secondary | ICD-10-CM

## 2017-08-31 LAB — COMPREHENSIVE METABOLIC PANEL
ALK PHOS: 71 U/L (ref 38–126)
ALT: 18 U/L (ref 0–44)
ANION GAP: 10 (ref 5–15)
AST: 33 U/L (ref 15–41)
Albumin: 3.6 g/dL (ref 3.5–5.0)
BUN: 15 mg/dL (ref 8–23)
CALCIUM: 9.9 mg/dL (ref 8.9–10.3)
CO2: 28 mmol/L (ref 22–32)
CREATININE: 0.9 mg/dL (ref 0.44–1.00)
Chloride: 100 mmol/L (ref 98–111)
Glucose, Bld: 154 mg/dL — ABNORMAL HIGH (ref 70–99)
Potassium: 3.6 mmol/L (ref 3.5–5.1)
Sodium: 138 mmol/L (ref 135–145)
TOTAL PROTEIN: 6.2 g/dL — AB (ref 6.5–8.1)
Total Bilirubin: 1.2 mg/dL (ref 0.3–1.2)

## 2017-08-31 LAB — CBC WITH DIFFERENTIAL/PLATELET
BASOS ABS: 0 10*3/uL (ref 0.0–0.1)
Basophils Relative: 0 %
EOS PCT: 0 %
Eosinophils Absolute: 0 10*3/uL (ref 0.0–0.7)
HEMATOCRIT: 42.6 % (ref 36.0–46.0)
Hemoglobin: 14.5 g/dL (ref 12.0–15.0)
LYMPHS PCT: 24 %
Lymphs Abs: 1.9 10*3/uL (ref 0.7–4.0)
MCH: 29.6 pg (ref 26.0–34.0)
MCHC: 34 g/dL (ref 30.0–36.0)
MCV: 86.9 fL (ref 78.0–100.0)
MONO ABS: 0.6 10*3/uL (ref 0.1–1.0)
Monocytes Relative: 8 %
NEUTROS ABS: 5.3 10*3/uL (ref 1.7–7.7)
Neutrophils Relative %: 68 %
PLATELETS: 315 10*3/uL (ref 150–400)
RBC: 4.9 MIL/uL (ref 3.87–5.11)
RDW: 12.6 % (ref 11.5–15.5)
WBC: 7.8 10*3/uL (ref 4.0–10.5)

## 2017-08-31 LAB — MAGNESIUM: MAGNESIUM: 1.6 mg/dL — AB (ref 1.7–2.4)

## 2017-08-31 MED ORDER — HYDROCODONE-ACETAMINOPHEN 5-325 MG PO TABS
1.0000 | ORAL_TABLET | Freq: Four times a day (QID) | ORAL | Status: DC | PRN
  Administered 2017-08-31 – 2017-09-07 (×7): 1 via ORAL
  Filled 2017-08-31 (×7): qty 1

## 2017-08-31 MED ORDER — LORAZEPAM 1 MG PO TABS
1.0000 mg | ORAL_TABLET | Freq: Two times a day (BID) | ORAL | Status: DC | PRN
  Administered 2017-08-31 – 2017-09-08 (×11): 1 mg via ORAL
  Filled 2017-08-31 (×2): qty 2
  Filled 2017-08-31: qty 1
  Filled 2017-08-31: qty 2
  Filled 2017-08-31: qty 1
  Filled 2017-08-31: qty 2
  Filled 2017-08-31: qty 1
  Filled 2017-08-31: qty 2
  Filled 2017-08-31: qty 1
  Filled 2017-08-31 (×2): qty 2

## 2017-08-31 MED ORDER — MAGNESIUM OXIDE 400 (241.3 MG) MG PO TABS
400.0000 mg | ORAL_TABLET | Freq: Two times a day (BID) | ORAL | Status: AC
  Administered 2017-08-31 (×2): 400 mg via ORAL
  Filled 2017-08-31 (×2): qty 1

## 2017-08-31 NOTE — Progress Notes (Signed)
Patient attempted to eat whole cherry tomato and began choking. RN was at bedside and immediately sat patient upright and hit her on the back. Pt began to gasp for air until 2 large pieces of tomato came out of patient's mouth. Pt was then able to talk and breathing became normal again. Yankeur suction placed at bedside. O2 Sats 95-96% RA after event. MD Nettey on floor and made aware. Will continue to monitor closely.

## 2017-08-31 NOTE — Progress Notes (Signed)
PROGRESS NOTE    Amanda BreezeDana Hall  ZOX:[id number] DOB: 07-17-1950 DOA: 08/28/2017 PCP: Gordy SaversKwiatkowski, Peter F, MD   Brief Narrative: Amanda Hall is a 67 y.o. female with medical history significant of depression anxiety as well as bipolar disorder was initially brought into the hospital with complaint of manic behavior with agitation. Psychiatry evaluated and recommends inpatient behavioral admission.   Assessment & Plan:   Principal Problem:   MDD (major depressive disorder), recurrent severe, without psychosis (HCC) Active Problems:   Depression   Hypercholesterolemia   Generalized anxiety disorder   Hypokalemia   Protein-calorie malnutrition, severe   Hypokalemia Resolved  Hypomagnesemia -Replete as needed  Depression Psychiatry recommending inpatient admission. Patient IVCd on 08/31/17. -Continue Zyprexa, Remeron, Trileptal  Severe protein calorie malnutrition  Excessive sleepiness Possibly medication related. Improved.   DVT prophylaxis: Heparin Code Status:   Code Status: Full Code Family Communication: None at bedside Disposition Plan: Discharge to inpatient behavioral hospital. Medically ready for discharge.   Consultants:   Psychiatry  Procedures:   None  Antimicrobials:  None    Subjective: Concerned about her hospital bill  Objective: Vitals:   08/30/17 1435 08/30/17 2253 08/31/17 0634 08/31/17 1311  BP: (!) 151/90 124/72 110/71 103/71  Pulse: 98 (!) 120 96 91  Resp: 20 20 16 18   Temp: 98.1 F (36.7 C) 98.2 F (36.8 C) 98.1 F (36.7 C) (!) 97.5 F (36.4 C)  TempSrc: Oral Oral Oral Oral  SpO2: 100% 98% 97% 99%  Weight:        Intake/Output Summary (Last 24 hours) at 08/31/2017 1555 Last data filed at 08/31/2017 1500 Gross per 24 hour  Intake 2313.33 ml  Output 600 ml  Net 1713.33 ml   Filed Weights   08/28/17 2203  Weight: 49 kg    Examination:  General exam: Appears calm and comfortable Respiratory system: Clear to  auscultation. Respiratory effort normal. Cardiovascular system: S1 & S2 heard, RRR. No murmurs, rubs, gallops or clicks. Gastrointestinal system: Abdomen is nondistended, soft and nontender. No organomegaly or masses felt. Normal bowel sounds heard. Central nervous system: Alert and oriented. No focal neurological deficits. Extremities: No edema. No calf tenderness Skin: No cyanosis. No rashes Psychiatry: Judgement and insight appear impaired. Mood & affect depressed and flat. Perseverating about her hospital bill.    Data Reviewed: I have personally reviewed following labs and imaging studies  CBC: Recent Labs  Lab 08/28/17 1925 08/29/17 0522 08/30/17 1015 08/31/17 0559  WBC 4.1 5.8 6.6 7.8  NEUTROABS 3.1  --  4.9 5.3  HGB 12.0 13.4 13.8 14.5  HCT 35.7* 40.7 40.7 42.6  MCV 89.5 90.6 88.1 86.9  PLT 254 263 257 315   Basic Metabolic Panel: Recent Labs  Lab 08/28/17 1925 08/29/17 0522 08/30/17 1015 08/31/17 0559  NA 147* 145 140 138  K 2.5* 3.8 3.8 3.6  CL 104 108 100 100  CO2 31 29 29 28   GLUCOSE 153* 87 97 154*  BUN 41* 26* 13 15  CREATININE 1.05* 0.81 0.81 0.90  CALCIUM 10.1 9.6 9.8 9.9  MG  --   --  1.7 1.6*  PHOS  --   --  2.6  --    GFR: Estimated Creatinine Clearance: 47.6 mL/min (by C-G formula based on SCr of 0.9 mg/dL). Liver Function Tests: Recent Labs  Lab 08/28/17 1938 08/29/17 0522 08/30/17 1015 08/31/17 0559  AST 29 34 30 33  ALT 20 19 19 18   ALKPHOS 59 67 71 71  BILITOT 0.8 1.1  1.3* 1.2  PROT 6.2* 6.5 6.5 6.2*  ALBUMIN 3.7 3.8 3.8 3.6   No results for input(s): LIPASE, AMYLASE in the last 168 hours. Recent Labs  Lab 08/28/17 1925  AMMONIA 21   Coagulation Profile: No results for input(s): INR, PROTIME in the last 168 hours. Cardiac Enzymes: No results for input(s): CKTOTAL, CKMB, CKMBINDEX, TROPONINI in the last 168 hours. BNP (last 3 results) No results for input(s): PROBNP in the last 8760 hours. HbA1C: No results for input(s):  HGBA1C in the last 72 hours. CBG: No results for input(s): GLUCAP in the last 168 hours. Lipid Profile: No results for input(s): CHOL, HDL, LDLCALC, TRIG, CHOLHDL, LDLDIRECT in the last 72 hours. Thyroid Function Tests: No results for input(s): TSH, T4TOTAL, FREET4, T3FREE, THYROIDAB in the last 72 hours. Anemia Panel: No results for input(s): VITAMINB12, FOLATE, FERRITIN, TIBC, IRON, RETICCTPCT in the last 72 hours. Sepsis Labs: Recent Labs  Lab 08/28/17 1942  LATICACIDVEN 0.91    Recent Results (from the past 240 hour(s))  Blood culture (routine x 2)     Status: None (Preliminary result)   Collection Time: 08/28/17  7:37 PM  Result Value Ref Range Status   Specimen Description   Final    BLOOD LEFT ANTECUBITAL Performed at [facility], [address] [place], [place] [address]    Special Requests   Final    BOTTLES DRAWN AEROBIC AND ANAEROBIC Blood Culture adequate volume Performed at [facility], [address] [place], [place] [address]    Culture   Final    NO GROWTH 3 DAYS Performed at [facility], [address] [place], [place] [address]    Report Status PENDING  Incomplete  Blood culture (routine x 2)     Status: None (Preliminary result)   Collection Time: 08/28/17  7:44 PM  Result Value Ref Range Status   Specimen Description   Final    BLOOD RIGHT ANTECUBITAL Performed at [facility], [address] [place], [place] [address]    Special Requests   Final    BOTTLES DRAWN AEROBIC AND ANAEROBIC Blood Culture adequate volume Performed at [facility], [address] [place], [place] [address]    Culture   Final    NO GROWTH 3 DAYS Performed at [facility], [address] [place], [place] [address]    Report Status PENDING  Incomplete         Radiology Studies: No results found.      Scheduled Meds: . atorvastatin  20 mg Oral Daily  . enoxaparin  (LOVENOX) injection  40 mg Subcutaneous QHS  . feeding supplement (ENSURE ENLIVE)  237 mL Oral TID BM  . fluvoxaMINE  150 mg Oral QHS  . magnesium oxide  400 mg Oral BID  . mirtazapine  45 mg Oral QHS  . OLANZapine  5 mg Oral QHS  . OXcarbazepine  150 mg Oral QHS  . OXcarbazepine  75 mg Oral BID   Continuous Infusions: . dextrose 5 % and 0.45 % NaCl with KCl 20 mEq/L 75 mL/hr at 08/31/17 1500     LOS: 3 days     Jacquelin Hawkingalph Brecklyn Galvis, MD [facility] 08/31/2017, 3:55 PM Pager: [phone]  If 7PM-7AM, please contact night-coverage [email or web address] 08/31/2017, 3:55 PM

## 2017-08-31 NOTE — Care Management Important Message (Signed)
Important Message  Patient Details  Name: Amanda Hall [place] MRN: [id number] Date of Birth: 1950-11-01   Medicare Important Message Given:  Yes    [name], Amaia Lavallie 08/31/2017, 11:28 AMImportant Message  Patient Details  Name: Amanda Hall [place] MRN: [id number] Date of Birth: 1950-11-01   Medicare Important Message Given:  Yes    [name], Kaiya Boatman 08/31/2017, 11:27 AM

## 2017-08-31 NOTE — Progress Notes (Signed)
LCSW following for inpatient psych placement.   LCSW faxed IVC paperwork to magistrate per request of attending.   LCSW faxed patient out to psych facilities.   LCSW will continue to follow for inpatient psych placement.   Beulah GandyBernette Kaysee Hergert, LSCW [facility] [phone]

## 2017-08-31 NOTE — Progress Notes (Signed)
Pt barely slept through the night. Pt received a one time dose of 1mg  Ativan PO,as well as other scheduled night meds. Pt constantly pulling at tele wiring and trying to take gown off. Nursing staff attempts to calm pt but, she continues to yell and pull at tubings/wires. Pt may be sleeping during the day, but is not sleeping at night. Pt could benefit from more stimulation during the day to help tire her for night sleeping.

## 2017-09-01 DIAGNOSIS — F411 Generalized anxiety disorder: Secondary | ICD-10-CM

## 2017-09-01 NOTE — Progress Notes (Signed)
PROGRESS NOTE    Amanda Hall  AVW:[id number] DOB: May 05, 1950 DOA: 08/28/2017 PCP: GordCoolidge Breezey SaversKwiatkowski, Peter F, MD   Brief Narrative: Amanda Hall is a 67 y.o. female with medical history significant of depression anxiety as well as bipolar disorder was initially brought into the hospital with complaint of manic behavior with agitation. Psychiatry evaluated and recommends inpatient behavioral admission.   Assessment & Plan:   Principal Problem:   MDD (major depressive disorder), recurrent severe, without psychosis (HCC) Active Problems:   Depression   Hypercholesterolemia   Generalized anxiety disorder   Hypokalemia   Protein-calorie malnutrition, severe   Hypokalemia Resolved  Hypomagnesemia -Replete as needed  Depression Psychiatry recommending inpatient admission. Patient IVCd on 08/31/17. -Continue Zyprexa, Remeron, Trileptal  Severe protein calorie malnutrition  Excessive sleepiness Possibly medication related. Improved.   DVT prophylaxis: Heparin Code Status:   Code Status: Full Code Family Communication: None at bedside Disposition Plan: Discharge to inpatient behavioral hospital. Medically ready for discharge.   Consultants:   Psychiatry  Procedures:   None  Antimicrobials:  None    Subjective: No issues overnight.  Objective: Vitals:   08/31/17 1311 08/31/17 2024 09/01/17 0612 09/01/17 1423  BP: 103/71 108/65 (!) 143/87 104/64  Pulse: 91 (!) 111 84 (!) 104  Resp: 18  16 16   Temp: (!) 97.5 F (36.4 C) 98.3 F (36.8 C) 98.2 F (36.8 C) (!) 97.4 F (36.3 C)  TempSrc: Oral Oral Oral Oral  SpO2: 99% 98% 99% 97%  Weight:        Intake/Output Summary (Last 24 hours) at 09/01/2017 1545 Last data filed at 09/01/2017 1400 Gross per 24 hour  Intake 1719.57 ml  Output -  Net 1719.57 ml   Filed Weights   08/28/17 2203  Weight: 49 kg    Examination:  General: Well appearing, no distress   Data Reviewed: I have personally reviewed following  labs and imaging studies  CBC: Recent Labs  Lab 08/28/17 1925 08/29/17 0522 08/30/17 1015 08/31/17 0559  WBC 4.1 5.8 6.6 7.8  NEUTROABS 3.1  --  4.9 5.3  HGB 12.0 13.4 13.8 14.5  HCT 35.7* 40.7 40.7 42.6  MCV 89.5 90.6 88.1 86.9  PLT 254 263 257 315   Basic Metabolic Panel: Recent Labs  Lab 08/28/17 1925 08/29/17 0522 08/30/17 1015 08/31/17 0559  NA 147* 145 140 138  K 2.5* 3.8 3.8 3.6  CL 104 108 100 100  CO2 31 29 29 28   GLUCOSE 153* 87 97 154*  BUN 41* 26* 13 15  CREATININE 1.05* 0.81 0.81 0.90  CALCIUM 10.1 9.6 9.8 9.9  MG  --   --  1.7 1.6*  PHOS  --   --  2.6  --    GFR: Estimated Creatinine Clearance: 47.6 mL/min (by C-G formula based on SCr of 0.9 mg/dL). Liver Function Tests: Recent Labs  Lab 08/28/17 1938 08/29/17 0522 08/30/17 1015 08/31/17 0559  AST 29 34 30 33  ALT 20 19 19 18   ALKPHOS 59 67 71 71  BILITOT 0.8 1.1 1.3* 1.2  PROT 6.2* 6.5 6.5 6.2*  ALBUMIN 3.7 3.8 3.8 3.6   No results for input(s): LIPASE, AMYLASE in the last 168 hours. Recent Labs  Lab 08/28/17 1925  AMMONIA 21   Coagulation Profile: No results for input(s): INR, PROTIME in the last 168 hours. Cardiac Enzymes: No results for input(s): CKTOTAL, CKMB, CKMBINDEX, TROPONINI in the last 168 hours. BNP (last 3 results) No results for input(s): PROBNP in the last 8760  hours. HbA1C: No results for input(s): HGBA1C in the last 72 hours. CBG: No results for input(s): GLUCAP in the last 168 hours. Lipid Profile: No results for input(s): CHOL, HDL, LDLCALC, TRIG, CHOLHDL, LDLDIRECT in the last 72 hours. Thyroid Function Tests: No results for input(s): TSH, T4TOTAL, FREET4, T3FREE, THYROIDAB in the last 72 hours. Anemia Panel: No results for input(s): VITAMINB12, FOLATE, FERRITIN, TIBC, IRON, RETICCTPCT in the last 72 hours. Sepsis Labs: Recent Labs  Lab 08/28/17 1942  LATICACIDVEN 0.91    Recent Results (from the past 240 hour(s))  Blood culture (routine x 2)      Status: None (Preliminary result)   Collection Time: 08/28/17  7:37 PM  Result Value Ref Range Status   Specimen Description   Final    BLOOD LEFT ANTECUBITAL Performed at [facility], [address] [place], [place] [address]    Special Requests   Final    BOTTLES DRAWN AEROBIC AND ANAEROBIC Blood Culture adequate volume Performed at [facility], [address], [place], [place] [address]    Culture   Final    NO GROWTH 4 DAYS Performed at [facility], [address] [place], [place] [address]    Report Status PENDING  Incomplete  Blood culture (routine x 2)     Status: None (Preliminary result)   Collection Time: 08/28/17  7:44 PM  Result Value Ref Range Status   Specimen Description   Final    BLOOD RIGHT ANTECUBITAL Performed at [facility], [address], [place], [place] [address]    Special Requests   Final    BOTTLES DRAWN AEROBIC AND ANAEROBIC Blood Culture adequate volume Performed at [facility], [address] [place], [place] [address]    Culture   Final    NO GROWTH 4 DAYS Performed at [facility], [address], [place], [place] [address]    Report Status PENDING  Incomplete         Radiology Studies: No results found.      Scheduled Meds: . atorvastatin  20 mg Oral Daily  . enoxaparin (LOVENOX) injection  40 mg Subcutaneous QHS  . feeding supplement (ENSURE ENLIVE)  237 mL Oral TID BM  . fluvoxaMINE  150 mg Oral QHS  . mirtazapine  45 mg Oral QHS  . OLANZapine  5 mg Oral QHS  . OXcarbazepine  150 mg Oral QHS  . OXcarbazepine  75 mg Oral BID   Continuous Infusions: . dextrose 5 % and 0.45 % NaCl with KCl 20 mEq/L 75 mL/hr at 09/01/17 1400     LOS: 4 days     Jacquelin Hawking, MD [facility] 09/01/2017, 3:45 PM Pager: [phone]  If 7PM-7AM, please contact night-coverage [email or web address] 09/01/2017, 3:45 PM

## 2017-09-01 NOTE — Progress Notes (Signed)
Call placed to patient's sister [phone] to complete health history. No answer, unable to leave voicemail. will have new primary attempt for completion.

## 2017-09-01 NOTE — Care Management Note (Signed)
Case Management Note  Patient Details  Name: Amanda Hall MRN: [id number] Date of Birth: 11/03/1950  Subjective/Objective: Medically stable . Psych CSW following for IP Psych.                   Action/Plan:d/c IP Psych.   Expected Discharge Date:                  Expected Discharge Plan:  Psychiatric Hospital  In-House Referral:  Clinical Social Work  Discharge planning Services  CM Consult  Post Acute Care Choice:    Choice offered to:     DME Arranged:    DME Agency:     HH Arranged:    HH Agency:     Status of Service:     If discussed at [employer] Meetings, dates discussed:    Additional Comments:  Amanda Hall, Amanda Shankles, RN 09/01/2017, 12:39 PM

## 2017-09-01 NOTE — Progress Notes (Signed)
Patient has been resting this am. Able to arouse. Will give morning medications once pt is fully awake. Sitter remains at bedside.

## 2017-09-02 LAB — CULTURE, BLOOD (ROUTINE X 2)
CULTURE: NO GROWTH
CULTURE: NO GROWTH
SPECIAL REQUESTS: ADEQUATE
Special Requests: ADEQUATE

## 2017-09-02 LAB — BASIC METABOLIC PANEL
ANION GAP: 7 (ref 5–15)
BUN: 22 mg/dL (ref 8–23)
CHLORIDE: 105 mmol/L (ref 98–111)
CO2: 27 mmol/L (ref 22–32)
Calcium: 9.6 mg/dL (ref 8.9–10.3)
Creatinine, Ser: 0.93 mg/dL (ref 0.44–1.00)
GFR calc non Af Amer: >60 mL/min (ref >60)
GLUCOSE: 131 mg/dL — AB (ref 70–99)
POTASSIUM: 4.1 mmol/L (ref 3.5–5.1)
Sodium: 139 mmol/L (ref 135–145)

## 2017-09-02 LAB — MAGNESIUM: Magnesium: 1.9 mg/dL (ref 1.7–2.4)

## 2017-09-02 MED ORDER — DEXTROSE-NACL 5-0.45 % IV SOLN
INTRAVENOUS | Status: DC
  Administered 2017-09-02 – 2017-09-06 (×8): via INTRAVENOUS

## 2017-09-02 NOTE — Progress Notes (Signed)
Pt anxious, manic, fixated and obsessing on random things such as getting an UTI, contamination, keeps repeating she can't take medicine because she it'll make her sleepy and she can't fall asleep. Pt distrustful and paranoid and questions everything. Pt took medicine after many attempts and convincing but refused Lovenox and started shaking while attempting to educate, lovenox not given. Sitter at bedside.

## 2017-09-02 NOTE — Progress Notes (Signed)
PROGRESS NOTE    Amanda BreezeDana Hall  MVH:[id number] DOB: [birth year] DOA: 08/28/2017 PCP: Gordy SaversKwiatkowski, Peter F, MD   Brief Narrative: Amanda Hall is a 67 y.o. female with medical history significant of depression anxiety as well as bipolar disorder was initially brought into the hospital with complaint of manic behavior with agitation. Psychiatry evaluated and recommends inpatient behavioral admission.   Assessment & Plan:   Principal Problem:   MDD (major depressive disorder), recurrent severe, without psychosis (HCC) Active Problems:   Depression   Hypercholesterolemia   Generalized anxiety disorder   Hypokalemia   Protein-calorie malnutrition, severe   Hypokalemia Resolved  Hypomagnesemia Resolved  Depression Psychiatry recommending inpatient admission. Patient IVCd on 08/31/17. -Continue Zyprexa, Remeron, Trileptal  Severe protein calorie malnutrition  Excessive sleepiness Possibly medication related. Resolved.   DVT prophylaxis: Heparin Code Status:   Code Status: Full Code Family Communication: None at bedside Disposition Plan: Discharge to inpatient behavioral hospital. Medically ready for discharge.   Consultants:   Psychiatry  Procedures:   None  Antimicrobials:  None    Subjective: Complaints of her foot. Unable to tell me what is wrong.  Objective: Vitals:   09/01/17 1423 09/01/17 2018 09/02/17 0441 09/02/17 0959  BP: 104/64 117/69 136/80   Pulse: (!) 104 96 84   Resp: 16 18 16    Temp: (!) 97.4 F (36.3 C) 97.6 F (36.4 C) 97.6 F (36.4 C)   TempSrc: Oral Oral Oral   SpO2: 97% 97% 99%   Weight:      Height:    5\' 7"  (1.702 m)    Intake/Output Summary (Last 24 hours) at 09/02/2017 1327 Last data filed at 09/02/2017 0801 Gross per 24 hour  Intake 1814.66 ml  Output -  Net 1814.66 ml   Filed Weights   08/28/17 2203  Weight: 49 kg    Examination:  General exam: Appears calm and comfortable Respiratory system: Respiratory effort  normal. Gastrointestinal system: Abdomen is nondistended, soft and nontender. Normal bowel sounds heard. Central nervous system: Alert and oriented. No focal neurological deficits. Extremities: No edema. No calf tenderness. No foot tenderness or abnormalities. Skin: No cyanosis. No rashes Psychiatry: Judgement and insight appear impaired. Anxious. Flat affect   Data Reviewed: I have personally reviewed following labs and imaging studies  CBC: Recent Labs  Lab 08/28/17 1925 08/29/17 0522 08/30/17 1015 08/31/17 0559  WBC 4.1 5.8 6.6 7.8  NEUTROABS 3.1  --  4.9 5.3  HGB 12.0 13.4 13.8 14.5  HCT 35.7* 40.7 40.7 42.6  MCV 89.5 90.6 88.1 86.9  PLT 254 263 257 315   Basic Metabolic Panel: Recent Labs  Lab 08/28/17 1925 08/29/17 0522 08/30/17 1015 08/31/17 0559 09/02/17 1035  NA 147* 145 140 138 139  K 2.5* 3.8 3.8 3.6 4.1  CL 104 108 100 100 105  CO2 31 29 29 28 27   GLUCOSE 153* 87 97 154* 131*  BUN 41* 26* 13 15 22   CREATININE 1.05* 0.81 0.81 0.90 0.93  CALCIUM 10.1 9.6 9.8 9.9 9.6  MG  --   --  1.7 1.6* 1.9  PHOS  --   --  2.6  --   --    GFR: Estimated Creatinine Clearance: 46 mL/min (by C-G formula based on SCr of 0.93 mg/dL). Liver Function Tests: Recent Labs  Lab 08/28/17 1938 08/29/17 0522 08/30/17 1015 08/31/17 0559  AST 29 34 30 33  ALT 20 19 19 18   ALKPHOS 59 67 71 71  BILITOT 0.8 1.1 1.3* 1.2  PROT 6.2* 6.5 6.5 6.2*  ALBUMIN 3.7 3.8 3.8 3.6   No results for input(s): LIPASE, AMYLASE in the last 168 hours. Recent Labs  Lab 08/28/17 1925  AMMONIA 21   Coagulation Profile: No results for input(s): INR, PROTIME in the last 168 hours. Cardiac Enzymes: No results for input(s): CKTOTAL, CKMB, CKMBINDEX, TROPONINI in the last 168 hours. BNP (last 3 results) No results for input(s): PROBNP in the last 8760 hours. HbA1C: No results for input(s): HGBA1C in the last 72 hours. CBG: No results for input(s): GLUCAP in the last 168 hours. Lipid  Profile: No results for input(s): CHOL, HDL, LDLCALC, TRIG, CHOLHDL, LDLDIRECT in the last 72 hours. Thyroid Function Tests: No results for input(s): TSH, T4TOTAL, FREET4, T3FREE, THYROIDAB in the last 72 hours. Anemia Panel: No results for input(s): VITAMINB12, FOLATE, FERRITIN, TIBC, IRON, RETICCTPCT in the last 72 hours. Sepsis Labs: Recent Labs  Lab 08/28/17 1942  LATICACIDVEN 0.91    Recent Results (from the past 240 hour(s))  Blood culture (routine x 2)     Status: None   Collection Time: 08/28/17  7:37 PM  Result Value Ref Range Status   Specimen Description   Final    BLOOD LEFT ANTECUBITAL Performed at [facility], [address], [place], [place] [address]    Special Requests   Final    BOTTLES DRAWN AEROBIC AND ANAEROBIC Blood Culture adequate volume Performed at [facility], [address], [place], [place] [address]    Culture   Final    NO GROWTH 5 DAYS Performed at [facility], [address], [place], [place] [address]    Report Status 09/02/2017 FINAL  Final  Blood culture (routine x 2)     Status: None   Collection Time: 08/28/17  7:44 PM  Result Value Ref Range Status   Specimen Description   Final    BLOOD RIGHT ANTECUBITAL Performed at [facility], [address] [place], [place] [address]    Special Requests   Final    BOTTLES DRAWN AEROBIC AND ANAEROBIC Blood Culture adequate volume Performed at [facility], [address], [place], [place] [address]    Culture   Final    NO GROWTH 5 DAYS Performed at [facility], [address], [place], [place] [address]    Report Status 09/02/2017 FINAL  Final         Radiology Studies: No results found.      Scheduled Meds: . atorvastatin  20 mg Oral Daily  . enoxaparin (LOVENOX) injection  40 mg Subcutaneous QHS  . feeding supplement (ENSURE ENLIVE)  237 mL Oral TID BM  . fluvoxaMINE  150 mg Oral QHS   . mirtazapine  45 mg Oral QHS  . OLANZapine  5 mg Oral QHS  . OXcarbazepine  150 mg Oral QHS  . OXcarbazepine  75 mg Oral BID   Continuous Infusions: . dextrose 5 % and 0.45% NaCl 75 mL/hr at 09/02/17 1044     LOS: 5 days     Jacquelin Hawking, MD [facility] 09/02/2017, 1:27 PM Pager: [phone]  If 7PM-7AM, please contact night-coverage [email or web address] 09/02/2017, 1:27 PM

## 2017-09-02 NOTE — Progress Notes (Addendum)
Patient does not have a psych bed.   LCSW faxed patient out to facilities again.   [facility]- Reviewing patient. Requested updated labs and progress note.  [facility]   LCSW will continue to follow for placement.    Beulah GandyBernette Tomislav Micale, LSCW [facility] CSW [phone]

## 2017-09-03 NOTE — Progress Notes (Signed)
This RN [facility] for [facility] inquiry submitted on 8/23 for pt. Particular paper work requested such as IVC paper work, BMP, CBC, CXR, EKG, UA, Toxicology, ETOH level, vital signs and behavior during admission. Will make on coming RN and aware for social work. Will continue to monitor pt.

## 2017-09-03 NOTE — Progress Notes (Signed)
PROGRESS NOTE    Amanda Hall  ZOX:[id number] DOB: 1950/11/27 DOA: 08/28/2017 PCP: Amanda SaversKwiatkowski, Peter F, MD   Brief Narrative: Amanda Hall is a 67 y.o. female with medical history significant of depression anxiety as well as bipolar disorder was initially brought into the hospital with complaint of manic behavior with agitation. Psychiatry evaluated and recommends inpatient behavioral admission.   Assessment & Plan:   Principal Problem:   MDD (major depressive disorder), recurrent severe, without psychosis (HCC) Active Problems:   Depression   Hypercholesterolemia   Generalized anxiety disorder   Hypokalemia   Protein-calorie malnutrition, severe   Hypokalemia Resolved  Hypomagnesemia Resolved  Depression Psychiatry recommending inpatient admission. Patient IVCd on 08/31/17. -Continue Zyprexa, Remeron, Trileptal  Severe protein calorie malnutrition  Excessive sleepiness Resolved   DVT prophylaxis: Heparin Code Status:   Code Status: Full Code Family Communication: None at bedside Disposition Plan: Discharge to inpatient behavioral hospital. Medically ready for discharge.   Consultants:   Psychiatry  Procedures:   None  Antimicrobials:  None    Subjective: No foot pain today.  Objective: Vitals:   09/02/17 1829 09/02/17 2104 09/03/17 0614 09/03/17 1324  BP: 118/66 130/71 131/81 (!) 109/58  Pulse: 97 99 94 80  Resp:    16  Temp: 97.6 Hall (36.4 C) 97.8 Hall (36.6 C) 97.6 Hall (36.4 C)   TempSrc: Oral Oral Oral   SpO2: 99% 100% 100% 99%  Weight:      Height:        Intake/Output Summary (Last 24 hours) at 09/03/2017 1355 Last data filed at 09/03/2017 1233 Gross per 24 hour  Intake 2006.29 ml  Output 700 ml  Net 1306.29 ml   Filed Weights   08/28/17 2203  Weight: 49 kg    Examination:  General: Well appearing, no distress  Data Reviewed: I have personally reviewed following labs and imaging studies  CBC: Recent Labs  Lab 08/28/17 1925  08/29/17 0522 08/30/17 1015 08/31/17 0559  WBC 4.1 5.8 6.6 7.8  NEUTROABS 3.1  --  4.9 5.3  HGB 12.0 13.4 13.8 14.5  HCT 35.7* 40.7 40.7 42.6  MCV 89.5 90.6 88.1 86.9  PLT 254 263 257 315   Basic Metabolic Panel: Recent Labs  Lab 08/28/17 1925 08/29/17 0522 08/30/17 1015 08/31/17 0559 09/02/17 1035  NA 147* 145 140 138 139  K 2.5* 3.8 3.8 3.6 4.1  CL 104 108 100 100 105  CO2 31 29 29 28 27   GLUCOSE 153* 87 97 154* 131*  BUN 41* 26* 13 15 22   CREATININE 1.05* 0.81 0.81 0.90 0.93  CALCIUM 10.1 9.6 9.8 9.9 9.6  MG  --   --  1.7 1.6* 1.9  PHOS  --   --  2.6  --   --    GFR: Estimated Creatinine Clearance: 46 mL/min (by C-G formula based on SCr of 0.93 mg/dL). Liver Function Tests: Recent Labs  Lab 08/28/17 1938 08/29/17 0522 08/30/17 1015 08/31/17 0559  AST 29 34 30 33  ALT 20 19 19 18   ALKPHOS 59 67 71 71  BILITOT 0.8 1.1 1.3* 1.2  PROT 6.2* 6.5 6.5 6.2*  ALBUMIN 3.7 3.8 3.8 3.6   No results for input(s): LIPASE, AMYLASE in the last 168 hours. Recent Labs  Lab 08/28/17 1925  AMMONIA 21   Coagulation Profile: No results for input(s): INR, PROTIME in the last 168 hours. Cardiac Enzymes: No results for input(s): CKTOTAL, CKMB, CKMBINDEX, TROPONINI in the last 168 hours. BNP (last 3 results)  No results for input(s): PROBNP in the last 8760 hours. HbA1C: No results for input(s): HGBA1C in the last 72 hours. CBG: No results for input(s): GLUCAP in the last 168 hours. Lipid Profile: No results for input(s): CHOL, HDL, LDLCALC, TRIG, CHOLHDL, LDLDIRECT in the last 72 hours. Thyroid Function Tests: No results for input(s): TSH, T4TOTAL, FREET4, T3FREE, THYROIDAB in the last 72 hours. Anemia Panel: No results for input(s): VITAMINB12, FOLATE, FERRITIN, TIBC, IRON, RETICCTPCT in the last 72 hours. Sepsis Labs: Recent Labs  Lab 08/28/17 1942  LATICACIDVEN 0.91    Recent Results (from the past 240 hour(s))  Blood culture (routine x 2)     Status: None    Collection Time: 08/28/17  7:37 PM  Result Value Ref Range Status   Specimen Description   Final    BLOOD LEFT ANTECUBITAL Performed at [facility], [address], [place], [place] [address]    Special Requests   Final    BOTTLES DRAWN AEROBIC AND ANAEROBIC Blood Culture adequate volume Performed at [facility], [address], [place], [place] [address]    Culture   Final    NO GROWTH 5 DAYS Performed at [facility], [address], [place], [place] [address]    Report Status 09/02/2017 FINAL  Final  Blood culture (routine x 2)     Status: None   Collection Time: 08/28/17  7:44 PM  Result Value Ref Range Status   Specimen Description   Final    BLOOD RIGHT ANTECUBITAL Performed at [facility], [address], [place], [place] [address]    Special Requests   Final    BOTTLES DRAWN AEROBIC AND ANAEROBIC Blood Culture adequate volume Performed at [facility], [address], [place], [place] [address]    Culture   Final    NO GROWTH 5 DAYS Performed at [facility], [address] [place], [place] [address]    Report Status 09/02/2017 FINAL  Final         Radiology Studies: No results found.      Scheduled Meds: . atorvastatin  20 mg Oral Daily  . enoxaparin (LOVENOX) injection  40 mg Subcutaneous QHS  . feeding supplement (ENSURE ENLIVE)  237 mL Oral TID BM  . fluvoxaMINE  150 mg Oral QHS  . mirtazapine  45 mg Oral QHS  . OLANZapine  5 mg Oral QHS  . OXcarbazepine  150 mg Oral QHS  . OXcarbazepine  75 mg Oral BID   Continuous Infusions: . dextrose 5 % and 0.45% NaCl 75 mL/hr at 09/03/17 0400     LOS: 6 days     Jacquelin Hawking, MD [facility] 09/03/2017, 1:55 PM Pager: [phone]  If 7PM-7AM, please contact night-coverage [email or web address] 09/03/2017, 1:55 PM

## 2017-09-03 NOTE — Progress Notes (Signed)
Pt still anxious, paranoid, afraid to urinate with Purewick in place. Pt still not sleeping.

## 2017-09-04 LAB — CREATININE, SERUM
CREATININE: 0.86 mg/dL (ref 0.44–1.00)
GFR calc Af Amer: >60 mL/min (ref >60)
GFR calc non Af Amer: >60 mL/min (ref >60)

## 2017-09-04 NOTE — Progress Notes (Signed)
CSW following up with patient per RN note regarding [facility] for additional information. CSW contacted [facility] intake [phone]). [facility] is not aware of patient and does not accept Gero-psych patients.  CSW confirmed IVC paperwork completion. IVC valid through 09/07/17.  Prior CSW note indicates that Gero-pysch referrals were faxed out on 09/02/17. Patient waiting for bed offer, CSW continuing to follow.  Enid Cutterharlotte Laiba Fuerte, LCSW-A Clinical Social Worker [phone]

## 2017-09-04 NOTE — Progress Notes (Signed)
PROGRESS NOTE    Amanda Hall  ZOX:[id number] DOB: Feb 07, 1950 DOA: 08/28/2017 PCP: Gordy SaversKwiatkowski, Peter F, MD   Brief Narrative: Amanda Hall is a 67 y.o. female with medical history significant of depression anxiety as well as bipolar disorder was initially brought into the hospital with complaint of manic behavior with agitation. Psychiatry evaluated and recommends inpatient behavioral admission.   Assessment & Plan:   Principal Problem:   MDD (major depressive disorder), recurrent severe, without psychosis (HCC) Active Problems:   Depression   Hypercholesterolemia   Generalized anxiety disorder   Hypokalemia   Protein-calorie malnutrition, severe   Hypokalemia Resolved  Hypomagnesemia Resolved  Depression Psychiatry recommending inpatient admission. Patient IVCd on 08/31/17. -Continue Zyprexa, Remeron, Trileptal  Severe protein calorie malnutrition  Excessive sleepiness Resolved   DVT prophylaxis: Heparin Code Status:   Code Status: Full Code Family Communication: None at bedside Disposition Plan: Discharge to inpatient behavioral hospital. Medically ready for discharge.   Consultants:   Psychiatry  Procedures:   None  Antimicrobials:  None    Subjective: No issues overnight.  Objective: Vitals:   09/03/17 0614 09/03/17 1324 09/03/17 2100 09/04/17 0642  BP: 131/81 (!) 109/58 130/77 114/73  Pulse: 94 80 98 99  Resp:  16 17 15   Temp: 97.6 F (36.4 C)  98.1 F (36.7 C) 98.3 F (36.8 C)  TempSrc: Oral  Oral Oral  SpO2: 100% 99% 97% 98%  Weight:      Height:        Intake/Output Summary (Last 24 hours) at 09/04/2017 1215 Last data filed at 09/04/2017 0600 Gross per 24 hour  Intake 2134.11 ml  Output 800 ml  Net 1334.11 ml   Filed Weights   08/28/17 2203  Weight: 49 kg    Examination:  General: Well appearing, no distress  Data Reviewed: I have personally reviewed following labs and imaging studies  CBC: Recent Labs  Lab  08/28/17 1925 08/29/17 0522 08/30/17 1015 08/31/17 0559  WBC 4.1 5.8 6.6 7.8  NEUTROABS 3.1  --  4.9 5.3  HGB 12.0 13.4 13.8 14.5  HCT 35.7* 40.7 40.7 42.6  MCV 89.5 90.6 88.1 86.9  PLT 254 263 257 315   Basic Metabolic Panel: Recent Labs  Lab 08/28/17 1925 08/29/17 0522 08/30/17 1015 08/31/17 0559 09/02/17 1035 09/04/17 0524  NA 147* 145 140 138 139  --   K 2.5* 3.8 3.8 3.6 4.1  --   CL 104 108 100 100 105  --   CO2 31 29 29 28 27   --   GLUCOSE 153* 87 97 154* 131*  --   BUN 41* 26* 13 15 22   --   CREATININE 1.05* 0.81 0.81 0.90 0.93 0.86  CALCIUM 10.1 9.6 9.8 9.9 9.6  --   MG  --   --  1.7 1.6* 1.9  --   PHOS  --   --  2.6  --   --   --    GFR: Estimated Creatinine Clearance: 49.8 mL/min (by C-G formula based on SCr of 0.86 mg/dL). Liver Function Tests: Recent Labs  Lab 08/28/17 1938 08/29/17 0522 08/30/17 1015 08/31/17 0559  AST 29 34 30 33  ALT 20 19 19 18   ALKPHOS 59 67 71 71  BILITOT 0.8 1.1 1.3* 1.2  PROT 6.2* 6.5 6.5 6.2*  ALBUMIN 3.7 3.8 3.8 3.6   No results for input(s): LIPASE, AMYLASE in the last 168 hours. Recent Labs  Lab 08/28/17 1925  AMMONIA 21   Coagulation Profile: No  results for input(s): INR, PROTIME in the last 168 hours. Cardiac Enzymes: No results for input(s): CKTOTAL, CKMB, CKMBINDEX, TROPONINI in the last 168 hours. BNP (last 3 results) No results for input(s): PROBNP in the last 8760 hours. HbA1C: No results for input(s): HGBA1C in the last 72 hours. CBG: No results for input(s): GLUCAP in the last 168 hours. Lipid Profile: No results for input(s): CHOL, HDL, LDLCALC, TRIG, CHOLHDL, LDLDIRECT in the last 72 hours. Thyroid Function Tests: No results for input(s): TSH, T4TOTAL, FREET4, T3FREE, THYROIDAB in the last 72 hours. Anemia Panel: No results for input(s): VITAMINB12, FOLATE, FERRITIN, TIBC, IRON, RETICCTPCT in the last 72 hours. Sepsis Labs: Recent Labs  Lab 08/28/17 1942  LATICACIDVEN 0.91    Recent  Results (from the past 240 hour(s))  Blood culture (routine x 2)     Status: None   Collection Time: 08/28/17  7:37 PM  Result Value Ref Range Status   Specimen Description   Final    BLOOD LEFT ANTECUBITAL Performed at [facility], [address], [place], [place] [address]    Special Requests   Final    BOTTLES DRAWN AEROBIC AND ANAEROBIC Blood Culture adequate volume Performed at [facility], [address], [place], [place] [address]    Culture   Final    NO GROWTH 5 DAYS Performed at [facility], [address], [place], [place] [address]    Report Status 09/02/2017 FINAL  Final  Blood culture (routine x 2)     Status: None   Collection Time: 08/28/17  7:44 PM  Result Value Ref Range Status   Specimen Description   Final    BLOOD RIGHT ANTECUBITAL Performed at [facility], [address] [place], [place] [address]    Special Requests   Final    BOTTLES DRAWN AEROBIC AND ANAEROBIC Blood Culture adequate volume Performed at [facility], [address], [place], [place] [address]    Culture   Final    NO GROWTH 5 DAYS Performed at [facility], [address] [place], [place] [address]    Report Status 09/02/2017 FINAL  Final         Radiology Studies: No results found.      Scheduled Meds: . atorvastatin  20 mg Oral Daily  . enoxaparin (LOVENOX) injection  40 mg Subcutaneous QHS  . feeding supplement (ENSURE ENLIVE)  237 mL Oral TID BM  . fluvoxaMINE  150 mg Oral QHS  . mirtazapine  45 mg Oral QHS  . OLANZapine  5 mg Oral QHS  . OXcarbazepine  150 mg Oral QHS  . OXcarbazepine  75 mg Oral BID   Continuous Infusions: . dextrose 5 % and 0.45% NaCl 75 mL/hr at 09/04/17 0720     LOS: 7 days     Jacquelin Hawking, MD [facility] 09/04/2017, 12:15 PM Pager: [phone]  If 7PM-7AM, please contact night-coverage [email or web address] 09/04/2017, 12:15 PM

## 2017-09-05 LAB — RAPID URINE DRUG SCREEN, HOSP PERFORMED
Amphetamines: NOT DETECTED
Barbiturates: NOT DETECTED
Benzodiazepines: NOT DETECTED
Cocaine: NOT DETECTED
OPIATES: NOT DETECTED
TETRAHYDROCANNABINOL: NOT DETECTED

## 2017-09-05 NOTE — Progress Notes (Signed)
PROGRESS NOTE    Amanda Hall  ZOX:[id number] DOB: October 27, 1950 DOA: 08/28/2017 PCP: [name], MD   Brief Narrative: Amanda Hall is a 67 y.o. female with medical history significant of depression anxiety as well as bipolar disorder was initially brought into the hospital with complaint of manic behavior with agitation. Psychiatry evaluated and recommends inpatient behavioral admission.   Assessment & Plan:   Principal Problem:   MDD (major depressive disorder), recurrent severe, without psychosis (HCC) Active Problems:   Depression   Hypercholesterolemia   Generalized anxiety disorder   Hypokalemia   Protein-calorie malnutrition, severe   Hypokalemia Resolved  Hypomagnesemia Resolved  Depression Psychiatry recommending inpatient admission. Patient IVCd on 08/31/17. -Continue Zyprexa, Remeron, Trileptal  Severe protein calorie malnutrition  Excessive sleepiness Resolved   DVT prophylaxis: Heparin Code Status:   Code Status: Full Code Family Communication: None at bedside Disposition Plan: Discharge to inpatient behavioral hospital. Medically ready for discharge.   Consultants:   Psychiatry  Procedures:   None  Antimicrobials:  None    Subjective: Patient reports no issues.  Objective: Vitals:   09/04/17 1357 09/04/17 2018 09/05/17 0528 09/05/17 1418  BP: 118/65 130/71 (!) 119/56 (!) 112/51  Pulse: 94 95 (!) 108 79  Resp: 14     Temp: 98.2 F (36.8 C) 98.2 F (36.8 C)  (!) 97.5 F (36.4 C)  TempSrc: Oral Oral    SpO2: 98% 100% 99% 100%  Weight:      Height:        Intake/Output Summary (Last 24 hours) at 09/05/2017 1547 Last data filed at 09/05/2017 1521 Gross per 24 hour  Intake 3085.14 ml  Output -  Net 3085.14 ml   Filed Weights   08/28/17 2203  Weight: 49 kg    Examination:  General: Well appearing, no distress  Data Reviewed: I have personally reviewed following labs and imaging studies  CBC: Recent Labs  Lab  08/30/17 1015 08/31/17 0559  WBC 6.6 7.8  NEUTROABS 4.9 5.3  HGB 13.8 14.5  HCT 40.7 42.6  MCV 88.1 86.9  PLT 257 315   Basic Metabolic Panel: Recent Labs  Lab 08/30/17 1015 08/31/17 0559 09/02/17 1035 09/04/17 0524  NA 140 138 139  --   K 3.8 3.6 4.1  --   CL 100 100 105  --   CO2 29 28 27   --   GLUCOSE 97 154* 131*  --   BUN 13 15 22   --   CREATININE 0.81 0.90 0.93 0.86  CALCIUM 9.8 9.9 9.6  --   MG 1.7 1.6* 1.9  --   PHOS 2.6  --   --   --    GFR: Estimated Creatinine Clearance: 49.8 mL/min (by C-G formula based on SCr of 0.86 mg/dL). Liver Function Tests: Recent Labs  Lab 08/30/17 1015 08/31/17 0559  AST 30 33  ALT 19 18  ALKPHOS 71 71  BILITOT 1.3* 1.2  PROT 6.5 6.2*  ALBUMIN 3.8 3.6   No results for input(s): LIPASE, AMYLASE in the last 168 hours. No results for input(s): AMMONIA in the last 168 hours. Coagulation Profile: No results for input(s): INR, PROTIME in the last 168 hours. Cardiac Enzymes: No results for input(s): CKTOTAL, CKMB, CKMBINDEX, TROPONINI in the last 168 hours. BNP (last 3 results) No results for input(s): PROBNP in the last 8760 hours. HbA1C: No results for input(s): HGBA1C in the last 72 hours. CBG: No results for input(s): GLUCAP in the last 168 hours. Lipid Profile: No  results for input(s): CHOL, HDL, LDLCALC, TRIG, CHOLHDL, LDLDIRECT in the last 72 hours. Thyroid Function Tests: No results for input(s): TSH, T4TOTAL, FREET4, T3FREE, THYROIDAB in the last 72 hours. Anemia Panel: No results for input(s): VITAMINB12, FOLATE, FERRITIN, TIBC, IRON, RETICCTPCT in the last 72 hours. Sepsis Labs: No results for input(s): PROCALCITON, LATICACIDVEN in the last 168 hours.  Recent Results (from the past 240 hour(s))  Blood culture (routine x 2)     Status: None   Collection Time: 08/28/17  7:37 PM  Result Value Ref Range Status   Specimen Description   Final    BLOOD LEFT ANTECUBITAL Performed at [facility],  [address] [place], [place] [address]    Special Requests   Final    BOTTLES DRAWN AEROBIC AND ANAEROBIC Blood Culture adequate volume Performed at [facility], [address] [place], [place] [address]    Culture   Final    NO GROWTH 5 DAYS Performed at [facility], [address] [place], [place] [address]    Report Status 09/02/2017 FINAL  Final  Blood culture (routine x 2)     Status: None   Collection Time: 08/28/17  7:44 PM  Result Value Ref Range Status   Specimen Description   Final    BLOOD RIGHT ANTECUBITAL Performed at [facility], [address] [place], [place] [address]    Special Requests   Final    BOTTLES DRAWN AEROBIC AND ANAEROBIC Blood Culture adequate volume Performed at [facility], [address] [place], [place] [address]    Culture   Final    NO GROWTH 5 DAYS Performed at [facility], [address] [place], [place] [address]    Report Status 09/02/2017 FINAL  Final         Radiology Studies: No results found.      Scheduled Meds: . atorvastatin  20 mg Oral Daily  . enoxaparin (LOVENOX) injection  40 mg Subcutaneous QHS  . feeding supplement (ENSURE ENLIVE)  237 mL Oral TID BM  . fluvoxaMINE  150 mg Oral QHS  . mirtazapine  45 mg Oral QHS  . OLANZapine  5 mg Oral QHS  . OXcarbazepine  150 mg Oral QHS  . OXcarbazepine  75 mg Oral BID   Continuous Infusions: . dextrose 5 % and 0.45% NaCl 75 mL/hr at 09/05/17 1521     LOS: 8 days     Jacquelin Hawkingalph Tila Millirons, MD [facility] 09/05/2017, 3:47 PM Pager: [phone]  If 7PM-7AM, please contact night-coverage [email or web address] 09/05/2017, 3:47 PM

## 2017-09-05 NOTE — Progress Notes (Signed)
Urine drug screen collected and resulted today. Urine drug screen negative.  Amanda Hall, [name] 09/05/2017  2:01 PM

## 2017-09-05 NOTE — Progress Notes (Addendum)
Patient is calm and cooperative with staff. Patient alert and oriented to self, place, and time. Patient tearful/anxious at times but is able to be redirected/consoled by staff.   Amanda Hall, [name] 09/05/2017 12:25 PM

## 2017-09-05 NOTE — Progress Notes (Addendum)
[facility] is requesting to review patient clinicals. The facility will need a UDS and a progress note explaining the patient current behaviors.  Nurse informed. Patient IVC is still current.   1:52pm Requested documents sent to [facility] for review. CSW waiting for response.   Vivi BarrackNicole Kenyon Eichelberger, Alexander MtLCSW, MSW Clinical Social Worker  [phone] 09/05/2017  12:11 PM

## 2017-09-05 NOTE — Progress Notes (Signed)
Nutrition Follow-up  DOCUMENTATION CODES:   Severe malnutrition in context of chronic illness, Underweight  INTERVENTION:    Ensure Enlive po TID (strawberry), each supplement provides 350 kcal and 20 grams of protein  Magic cup TID with meals, each supplement provides 290 kcal and 9 grams of protein  Provide MVI daily  NUTRITION DIAGNOSIS:   Severe Malnutrition related to chronic illness(depression, anxiety, bipolar disorder) as evidenced by mild fat depletion, moderate fat depletion, mild muscle depletion, moderate muscle depletion, severe muscle depletion, percent weight loss(19.9% weight loss in < 5 months).  Ongoing  GOAL:   Patient will meet greater than or equal to 90% of their needs  Not meeting  MONITOR:   PO intake, Supplement acceptance, Labs, Weight trends, Skin  REASON FOR ASSESSMENT:   Malnutrition Screening Tool    ASSESSMENT:   67 year old female who presented to the ED with manic behavior. PMH significant for depression, anxiety, and bipolar disorder.   Pt remains lethargic. Spoke with tech/sitter at bedside. Pt ate very little this morning. She refused breakfast but has sipped on strawberry Ensure. Meal completions charted as 0-25% for her last 8 meals. RD to provide Magic Cups along with Ensure to maximize calories and protein. Will continue to encourage PO intake and monitor supplement acceptance.   A recent wt has not been obtained since last RD visit. Will try to obtain new weight.   Per MD, pt is medically cleared for discharge. She awaiting inpatient behavioral hospital placement.   Medications reviewed and include: Remeron, D5 @ 75 ml/hr Labs reviewed: electrolytes WDL  Diet Order:   Diet Order            Diet regular Room service appropriate? Yes; Fluid consistency: Thin  Diet effective now              EDUCATION NEEDS:   Not appropriate for education at this time  Skin:  Skin Assessment: Reviewed RN Assessment(ecchymosis to  arm, leg, hip)  Last BM:  unknown/PTA  Height:   Ht Readings from Last 1 Encounters:  09/02/17 5\' 7"  (1.702 m)    Weight:   Wt Readings from Last 1 Encounters:  08/28/17 49 kg    Ideal Body Weight:  61.36 kg  BMI:  Body mass index is 16.92 kg/m.  Estimated Nutritional Needs:   Kcal:  1500-1700  Protein:  75-90 grams  Fluid:  >/= 1.5 L   Vanessa Kickarly Clearence Vitug RD, LDN Clinical Nutrition Pager # [phone]- [phone]

## 2017-09-05 NOTE — Care Management Important Message (Signed)
Important Message  Patient Details  Name: Amanda Hall MRN: [id number] Date of Birth: 03/06/1950   Medicare Important Message Given:  Yes    [name], Laszlo Ellerby 09/05/2017, 1:30 PMImportant Message  Patient Details  Name: Amanda Hall MRN: [id number] Date of Birth: 03/06/1950   Medicare Important Message Given:  Yes    [name], Hitoshi Werts 09/05/2017, 1:30 PM

## 2017-09-06 MED ORDER — DOCUSATE SODIUM 100 MG PO CAPS
100.0000 mg | ORAL_CAPSULE | Freq: Two times a day (BID) | ORAL | Status: DC
  Administered 2017-09-06 – 2017-09-08 (×3): 100 mg via ORAL
  Filled 2017-09-06 (×4): qty 1

## 2017-09-06 NOTE — Progress Notes (Signed)
PROGRESS NOTE    Amanda Hall  ZOX:[id number] DOB: 1950/06/12 DOA: 08/28/2017 PCP: [name], MD   Brief Narrative: Amanda Hall is a 67 y.o. female with medical history significant of depression anxiety as well as bipolar disorder was initially brought into the hospital with complaint of manic behavior with agitation. Psychiatry evaluated and recommends inpatient behavioral admission.   Assessment & Plan:   Principal Problem:   MDD (major depressive disorder), recurrent severe, without psychosis (HCC) Active Problems:   Depression   Hypercholesterolemia   Generalized anxiety disorder   Hypokalemia   Protein-calorie malnutrition, severe   Hypokalemia Resolved  Hypomagnesemia Resolved  Depression Psychiatry recommending inpatient admission. Patient IVCd on 08/31/17. -Continue Zyprexa, Remeron, Trileptal -Psych to reevaluate  Severe protein calorie malnutrition  Excessive sleepiness Resolved   DVT prophylaxis: Heparin Code Status:   Code Status: Full Code Family Communication: None at bedside Disposition Plan: Discharge to inpatient behavioral hospital. Medically ready for discharge.   Consultants:   Psychiatry  Procedures:   None  Antimicrobials:  None    Subjective: Patient reports no issues.  Objective: Vitals:   09/05/17 0528 09/05/17 1418 09/05/17 2053 09/06/17 0419  BP: (!) 119/56 (!) 112/51 (!) 130/56 135/61  Pulse: (!) 108 79 99 (!) 105  Resp:   20 17  Temp:  (!) 97.5 F (36.4 C) (!) 97.5 F (36.4 C) 98.4 F (36.9 C)  TempSrc:   Oral Oral  SpO2: 99% 100% 100% 97%  Weight:      Height:        Intake/Output Summary (Last 24 hours) at 09/06/2017 1208 Last data filed at 09/06/2017 0542 Gross per 24 hour  Intake 2357.22 ml  Output -  Net 2357.22 ml   Filed Weights   08/28/17 2203  Weight: 49 kg    Examination:  General: Well appearing, no distress Psych: perseveration   Data Reviewed: I have personally reviewed  following labs and imaging studies  CBC: Recent Labs  Lab 08/31/17 0559  WBC 7.8  NEUTROABS 5.3  HGB 14.5  HCT 42.6  MCV 86.9  PLT 315   Basic Metabolic Panel: Recent Labs  Lab 08/31/17 0559 09/02/17 1035 09/04/17 0524  NA 138 139  --   K 3.6 4.1  --   CL 100 105  --   CO2 28 27  --   GLUCOSE 154* 131*  --   BUN 15 22  --   CREATININE 0.90 0.93 0.86  CALCIUM 9.9 9.6  --   MG 1.6* 1.9  --    GFR: Estimated Creatinine Clearance: 49.8 mL/min (by C-G formula based on SCr of 0.86 mg/dL). Liver Function Tests: Recent Labs  Lab 08/31/17 0559  AST 33  ALT 18  ALKPHOS 71  BILITOT 1.2  PROT 6.2*  ALBUMIN 3.6   No results for input(s): LIPASE, AMYLASE in the last 168 hours. No results for input(s): AMMONIA in the last 168 hours. Coagulation Profile: No results for input(s): INR, PROTIME in the last 168 hours. Cardiac Enzymes: No results for input(s): CKTOTAL, CKMB, CKMBINDEX, TROPONINI in the last 168 hours. BNP (last 3 results) No results for input(s): PROBNP in the last 8760 hours. HbA1C: No results for input(s): HGBA1C in the last 72 hours. CBG: No results for input(s): GLUCAP in the last 168 hours. Lipid Profile: No results for input(s): CHOL, HDL, LDLCALC, TRIG, CHOLHDL, LDLDIRECT in the last 72 hours. Thyroid Function Tests: No results for input(s): TSH, T4TOTAL, FREET4, T3FREE, THYROIDAB in the last 72  hours. Anemia Panel: No results for input(s): VITAMINB12, FOLATE, FERRITIN, TIBC, IRON, RETICCTPCT in the last 72 hours. Sepsis Labs: No results for input(s): PROCALCITON, LATICACIDVEN in the last 168 hours.  Recent Results (from the past 240 hour(s))  Blood culture (routine x 2)     Status: None   Collection Time: 08/28/17  7:37 PM  Result Value Ref Range Status   Specimen Description   Final    BLOOD LEFT ANTECUBITAL Performed at [facility], [address] [place], [place] [address]    Special Requests   Final    BOTTLES DRAWN  AEROBIC AND ANAEROBIC Blood Culture adequate volume Performed at [facility], [address] [place], [place] [address]    Culture   Final    NO GROWTH 5 DAYS Performed at [facility], [address] [place], [place] [address]    Report Status 09/02/2017 FINAL  Final  Blood culture (routine x 2)     Status: None   Collection Time: 08/28/17  7:44 PM  Result Value Ref Range Status   Specimen Description   Final    BLOOD RIGHT ANTECUBITAL Performed at [facility], [address] [place], [place] [address]    Special Requests   Final    BOTTLES DRAWN AEROBIC AND ANAEROBIC Blood Culture adequate volume Performed at [facility], [address] [place], [place] [address]    Culture   Final    NO GROWTH 5 DAYS Performed at [facility], [address] [place], [place] [address]    Report Status 09/02/2017 FINAL  Final         Radiology Studies: No results found.      Scheduled Meds: . atorvastatin  20 mg Oral Daily  . docusate sodium  100 mg Oral BID  . enoxaparin (LOVENOX) injection  40 mg Subcutaneous QHS  . feeding supplement (ENSURE ENLIVE)  237 mL Oral TID BM  . fluvoxaMINE  150 mg Oral QHS  . mirtazapine  45 mg Oral QHS  . OLANZapine  5 mg Oral QHS  . OXcarbazepine  150 mg Oral QHS  . OXcarbazepine  75 mg Oral BID   Continuous Infusions:    LOS: 9 days     Jacquelin Hawkingalph Sharen Youngren, MD [facility] 09/06/2017, 12:08 PM Pager: [phone]  If 7PM-7AM, please contact night-coverage [email or web address] 09/06/2017, 12:08 PM

## 2017-09-06 NOTE — Progress Notes (Signed)
Patient cooperative with staff. Patient alert and oriented to self, place, and time. Patient anxious at times and fixated on her bowel movements and urination. Patient paranoid at time, but easily redirected by staff.

## 2017-09-07 DIAGNOSIS — F419 Anxiety disorder, unspecified: Secondary | ICD-10-CM

## 2017-09-07 DIAGNOSIS — Z818 Family history of other mental and behavioral disorders: Secondary | ICD-10-CM

## 2017-09-07 DIAGNOSIS — F332 Major depressive disorder, recurrent severe without psychotic features: Secondary | ICD-10-CM

## 2017-09-07 LAB — COMPREHENSIVE METABOLIC PANEL
ALBUMIN: 2.8 g/dL — AB (ref 3.5–5.0)
ALK PHOS: 60 U/L (ref 38–126)
ALT: 17 U/L (ref 0–44)
AST: 25 U/L (ref 15–41)
Anion gap: 6 (ref 5–15)
BILIRUBIN TOTAL: 0.2 mg/dL — AB (ref 0.3–1.2)
BUN: 19 mg/dL (ref 8–23)
CALCIUM: 9.7 mg/dL (ref 8.9–10.3)
CO2: 32 mmol/L (ref 22–32)
Chloride: 106 mmol/L (ref 98–111)
Creatinine, Ser: 0.93 mg/dL (ref 0.44–1.00)
GFR calc Af Amer: >60 mL/min (ref >60)
GFR calc non Af Amer: >60 mL/min (ref >60)
GLUCOSE: 96 mg/dL (ref 70–99)
POTASSIUM: 3.4 mmol/L — AB (ref 3.5–5.1)
SODIUM: 144 mmol/L (ref 135–145)
TOTAL PROTEIN: 4.8 g/dL — AB (ref 6.5–8.1)

## 2017-09-07 LAB — CBC
HCT: 28.9 % — ABNORMAL LOW (ref 36.0–46.0)
HEMOGLOBIN: 9.4 g/dL — AB (ref 12.0–15.0)
MCH: 29.7 pg (ref 26.0–34.0)
MCHC: 32.5 g/dL (ref 30.0–36.0)
MCV: 91.5 fL (ref 78.0–100.0)
Platelets: 196 10*3/uL (ref 150–400)
RBC: 3.16 MIL/uL — AB (ref 3.87–5.11)
RDW: 13.1 % (ref 11.5–15.5)
WBC: 3.6 10*3/uL — ABNORMAL LOW (ref 4.0–10.5)

## 2017-09-07 LAB — MAGNESIUM: Magnesium: 2.1 mg/dL (ref 1.7–2.4)

## 2017-09-07 MED ORDER — OLANZAPINE 5 MG PO TABS
2.5000 mg | ORAL_TABLET | Freq: Every day | ORAL | Status: DC | PRN
  Administered 2017-09-07 – 2017-09-08 (×2): 2.5 mg via ORAL
  Filled 2017-09-07 (×2): qty 1

## 2017-09-07 NOTE — Progress Notes (Signed)
PROGRESS NOTE    Amanda Hall  ONG:[id number] DOB: [birth year] DOA: 08/28/2017 PCP: [name], MD   Brief Narrative: Amanda Hall is a 67 y.o. female with medical history significant of depression anxiety as well as bipolar disorder was initially brought into the hospital with complaint of manic behavior with agitation. Psychiatry evaluated and recommends inpatient behavioral admission.   Assessment & Plan:   Principal Problem:   MDD (major depressive disorder), recurrent severe, without psychosis (HCC) Active Problems:   Depression   Hypercholesterolemia   Generalized anxiety disorder   Hypokalemia   Protein-calorie malnutrition, severe   Hypokalemia Potassium today 3.4-recheck in 1 to 2 days  Normocytic anemia-hemoglobin 14.58/21 currently 9.4-no reports of overt bleeding although patient really not eating much. Recheck labs in a.m., get iron studies a.m.  Hypomagnesemia Resolved  Depression Psychiatry recommending inpatient admission. Patient IVCd on 08/31/17. -Continue Zyprexa, Remeron, Trileptal, Luvox 150 and Haldol as needed agitation -Psych to reevaluate as per social work note 8/28  Severe protein calorie malnutrition  Excessive sleepiness Resolved   DVT prophylaxis: Heparin Code Status:   Code Status: Full Code Family Communication: None at bedside Disposition Plan: Discharge to inpatient behavioral hospital as per psychiatrist   Consultants:   Psychiatry  Procedures:   None  Antimicrobials:  None    Subjective:  No new issues but not hungry not eating  Objective: Vitals:   09/06/17 2057 09/06/17 2235 09/07/17 0653 09/07/17 1352  BP:  (!) 147/72 117/63 116/61  Pulse:  98 76 95  Resp: 13 15 13 15   Temp:   97.7 F (36.5 C) 98.2 F (36.8 C)  TempSrc: Other (Comment)  Oral Oral  SpO2:  98% 99% 97%  Weight:      Height:       No intake or output data in the 24 hours ending 09/07/17 1434 Filed Weights   08/28/17 2203  Weight:  49 kg    Examination:  General: Well appearing, no distress Psych: perseveration agitated increased psychomotor mobility seems anxious S1-S2 no murmur rub or gallop Chest clinically clear   Data Reviewed: I have personally reviewed following labs and imaging studies  CBC: Recent Labs  Lab 09/07/17 0932  WBC 3.6*  HGB 9.4*  HCT 28.9*  MCV 91.5  PLT 196   Basic Metabolic Panel: Recent Labs  Lab 09/02/17 1035 09/04/17 0524 09/07/17 0932  NA 139  --  144  K 4.1  --  3.4*  CL 105  --  106  CO2 27  --  32  GLUCOSE 131*  --  96  BUN 22  --  19  CREATININE 0.93 0.86 0.93  CALCIUM 9.6  --  9.7  MG 1.9  --  2.1   GFR: Estimated Creatinine Clearance: 46 mL/min (by C-G formula based on SCr of 0.93 mg/dL). Liver Function Tests: Recent Labs  Lab 09/07/17 0932  AST 25  ALT 17  ALKPHOS 60  BILITOT 0.2*  PROT 4.8*  ALBUMIN 2.8*   No results for input(s): LIPASE, AMYLASE in the last 168 hours. No results for input(s): AMMONIA in the last 168 hours. Coagulation Profile: No results for input(s): INR, PROTIME in the last 168 hours. Cardiac Enzymes: No results for input(s): CKTOTAL, CKMB, CKMBINDEX, TROPONINI in the last 168 hours. BNP (last 3 results) No results for input(s): PROBNP in the last 8760 hours. HbA1C: No results for input(s): HGBA1C in the last 72 hours. CBG: No results for input(s): GLUCAP in the last 168 hours. Lipid  Profile: No results for input(s): CHOL, HDL, LDLCALC, TRIG, CHOLHDL, LDLDIRECT in the last 72 hours. Thyroid Function Tests: No results for input(s): TSH, T4TOTAL, FREET4, T3FREE, THYROIDAB in the last 72 hours. Anemia Panel: No results for input(s): VITAMINB12, FOLATE, FERRITIN, TIBC, IRON, RETICCTPCT in the last 72 hours. Sepsis Labs: No results for input(s): PROCALCITON, LATICACIDVEN in the last 168 hours.  Recent Results (from the past 240 hour(s))  Blood culture (routine x 2)     Status: None   Collection Time: 08/28/17  7:37 PM    Result Value Ref Range Status   Specimen Description   Final    BLOOD LEFT ANTECUBITAL Performed at [facility], [address] [place], [place] [address]    Special Requests   Final    BOTTLES DRAWN AEROBIC AND ANAEROBIC Blood Culture adequate volume Performed at [facility], [address] [place], [place] [address]    Culture   Final    NO GROWTH 5 DAYS Performed at [facility], [address] [place], [place] [address]    Report Status 09/02/2017 FINAL  Final  Blood culture (routine x 2)     Status: None   Collection Time: 08/28/17  7:44 PM  Result Value Ref Range Status   Specimen Description   Final    BLOOD RIGHT ANTECUBITAL Performed at [facility], [address] [place], [place] [address]    Special Requests   Final    BOTTLES DRAWN AEROBIC AND ANAEROBIC Blood Culture adequate volume Performed at [facility], [address] [place], [place] [address]    Culture   Final    NO GROWTH 5 DAYS Performed at [facility], [address] [place], [place] [address]    Report Status 09/02/2017 FINAL  Final         Radiology Studies: No results found.      Scheduled Meds: . atorvastatin  20 mg Oral Daily  . docusate sodium  100 mg Oral BID  . enoxaparin (LOVENOX) injection  40 mg Subcutaneous QHS  . feeding supplement (ENSURE ENLIVE)  237 mL Oral TID BM  . fluvoxaMINE  150 mg Oral QHS  . mirtazapine  45 mg Oral QHS  . OLANZapine  5 mg Oral QHS  . OXcarbazepine  150 mg Oral QHS  . OXcarbazepine  75 mg Oral BID   Continuous Infusions:    LOS: 10 days     Pleas KochJai Marleena Shubert, MD [facility] 2:34 PM   If 7PM-7AM, please contact night-coverage [email or web address] 09/07/2017, 2:34 PM

## 2017-09-07 NOTE — Progress Notes (Signed)
Patient does not have psych bed.   LCSW requested that psych see patient for a re-eval. Patient last eval on 8/19.   [place] inquired if patient still needed placement, but no beds available.  LCSW will continue to follow for disposition.   Beulah GandyBernette Aziah Kaiser, LSCW [facility] CSW [phone]

## 2017-09-07 NOTE — Consult Note (Signed)
[facility] Psych Consult Progress Note  09/07/2017 3:14 PM Amanda Hall  MRN:  [id number] Subjective:   Amanda Hall was last seen on 8/19 for depression and anxiety causing impairments in daily functioning as well as poor appetite with weight loss. She was continued on her home medications and Seroquel was switched to Zyprexa due to poor appetite. Per chart review, she has been anxious and tearful. She has been fixated on her bowel movement. She is only eating up to 25% of her meals with Ensure supplementation. It appears that she has been requiring less assistance with hygiene care.   On interview, Amanda Hall reports that she is "not so good." She perseverates about toileting. She reports that she is afraid of using the bathroom since it may worsen her hemorrhoids. She reports that she is afraid that she will have an accident related to urinary or bowel incontinence. She reports that she has been eating small portions of food to avoid this. She pauses for several seconds when asked if she has thoughts to harm herself and responds "No. I'm just so scared." She denies HI or AVH.   Principal Problem: MDD (major depressive disorder), recurrent severe, without psychosis ([place]) Diagnosis:   Patient Active Problem List   Diagnosis Date Noted  . Protein-calorie malnutrition, severe [E43] 08/29/2017  . Hypokalemia [E87.6] 08/28/2017  . Generalized anxiety disorder [F41.1] 05/26/2017  . MDD (major depressive disorder), recurrent severe, without psychosis ([place]) [F33.2] 05/26/2017  . Hypercholesterolemia [E78.00] 03/22/2017  . Nonspecific (abnormal) findings on radiological and other examination of body structure [793] 12/26/2006  . NONSPECIFIC ABNORM FIND RAD&OTH EXAM LUNG FIELD [R93.0] 12/26/2006  . ANEMIA-IRON DEFICIENCY [D50.9] 12/15/2006  . Depression [F32.9] 12/15/2006  . Allergic rhinitis [J30.9] 12/15/2006   Total Time spent with patient: 15 minutes  Past Psychiatric History: MDD  Past Medical History:   Past Medical History:  Diagnosis Date  . Allergy   . Anemia   . Anxiety   . Depression   . Hemorrhoids     Past Surgical History:  Procedure Laterality Date  . arm surgery     Left  . TUBAL LIGATION     Family History:  Family History  Problem Relation Age of Onset  . Stroke Mother   . Hypertension Sister   . Cancer Neg Hx        breat and colon hx  . Diabetes Neg Hx        family   Family Psychiatric  History: Grandfather-depression and committed suicide.   Social History:  Social History   Substance and Sexual Activity  Alcohol Use No     Social History   Substance and Sexual Activity  Drug Use No    Social History   Socioeconomic History  . Marital status: Divorced    Spouse name: Not on file  . Number of children: Not on file  . Years of education: Not on file  . Highest education level: Not on file  Occupational History  . Not on file  Social Needs  . Financial resource strain: Not on file  . Food insecurity:    Worry: Not on file    Inability: Not on file  . Transportation needs:    Medical: Not on file    Non-medical: Not on file  Tobacco Use  . Smoking status: Never Smoker  . Smokeless tobacco: Never Used  Substance and Sexual Activity  . Alcohol use: No  . Drug use: No  . Sexual activity: Not Currently  Lifestyle  . Physical activity:    Days per week: Not on file    Minutes per session: Not on file  . Stress: Not on file  Relationships  . Social connections:    Talks on phone: Not on file    Gets together: Not on file    Attends religious service: Not on file    Active member of club or organization: Not on file    Attends meetings of clubs or organizations: Not on file    Relationship status: Not on file  Other Topics Concern  . Not on file  Social History Narrative  . Not on file    Sleep: Fair  Appetite:  Poor  Current Medications: Current Facility-Administered Medications  Medication Dose Route Frequency Provider  Last Rate Last Dose  . atorvastatin (LIPITOR) tablet 20 mg  20 mg Oral Daily Elwyn Reach, MD   20 mg at 09/06/17 1814  . docusate sodium (COLACE) capsule 100 mg  100 mg Oral BID Mariel Aloe, MD   100 mg at 09/06/17 2208  . enoxaparin (LOVENOX) injection 40 mg  40 mg Subcutaneous QHS Gala Romney L, MD   40 mg at 09/06/17 2214  . feeding supplement (ENSURE ENLIVE) (ENSURE ENLIVE) liquid 237 mL  237 mL Oral TID BM Lavina Hamman, MD   237 mL at 09/06/17 2210  . fluvoxaMINE (LUVOX) tablet 150 mg  150 mg Oral QHS Gala Romney L, MD   150 mg at 09/06/17 2209  . haloperidol (HALDOL) tablet 5 mg  5 mg Oral Q6H PRN Elwyn Reach, MD   5 mg at 09/06/17 6967   Or  . haloperidol lactate (HALDOL) injection 2 mg  2 mg Intramuscular Q6H PRN Elwyn Reach, MD   2 mg at 09/06/17 1713  . HYDROcodone-acetaminophen (NORCO/VICODIN) 5-325 MG per tablet 1-2 tablet  1-2 tablet Oral Q6H PRN Schorr, Rhetta Mura, NP   1 tablet at 09/07/17 0017  . LORazepam (ATIVAN) tablet 1 mg  1 mg Oral BID PRN Schorr, Rhetta Mura, NP   1 mg at 09/07/17 0017  . mirtazapine (REMERON) tablet 45 mg  45 mg Oral QHS Elwyn Reach, MD   45 mg at 09/06/17 2207  . OLANZapine (ZYPREXA) tablet 5 mg  5 mg Oral QHS Lavina Hamman, MD   5 mg at 09/06/17 2207  . OXcarbazepine (TRILEPTAL) tablet 150 mg  150 mg Oral QHS Lavina Hamman, MD   150 mg at 09/06/17 2209  . OXcarbazepine (TRILEPTAL) tablet 75 mg  75 mg Oral BID Lavina Hamman, MD   75 mg at 09/06/17 1630  . polyethylene glycol (MIRALAX / GLYCOLAX) packet 17 g  17 g Oral Daily PRN Elwyn Reach, MD        Lab Results:  Results for orders placed or performed during the hospital encounter of 08/28/17 (from the past 48 hour(s))  Comprehensive metabolic panel     Status: Abnormal   Collection Time: 09/07/17  9:32 AM  Result Value Ref Range   Sodium 144 135 - 145 mmol/L   Potassium 3.4 (L) 3.5 - 5.1 mmol/L   Chloride 106 98 - 111 mmol/L   CO2 32 22 - 32  mmol/L   Glucose, Bld 96 70 - 99 mg/dL   BUN 19 8 - 23 mg/dL   Creatinine, Ser 0.93 0.44 - 1.00 mg/dL   Calcium 9.7 8.9 - 10.3 mg/dL   Total Protein 4.8 (L) 6.5 - 8.1  g/dL   Albumin 2.8 (L) 3.5 - 5.0 g/dL   AST 25 15 - 41 U/L   ALT 17 0 - 44 U/L   Alkaline Phosphatase 60 38 - 126 U/L   Total Bilirubin 0.2 (L) 0.3 - 1.2 mg/dL   GFR calc non Af Amer >60 >60 mL/min   GFR calc Af Amer >60 >60 mL/min    Comment: (NOTE) The eGFR has been calculated using the CKD EPI equation. This calculation has not been validated in all clinical situations. eGFR's persistently <60 mL/min signify possible Chronic Kidney Disease.    Anion gap 6 5 - 15    Comment: Performed at [facility], [place] [address], [place], [place] [address]  Magnesium     Status: None   Collection Time: 09/07/17  9:32 AM  Result Value Ref Range   Magnesium 2.1 1.7 - 2.4 mg/dL    Comment: Performed at [facility], [place] [address] [place], [place] [address]  CBC     Status: Abnormal   Collection Time: 09/07/17  9:32 AM  Result Value Ref Range   WBC 3.6 (L) 4.0 - 10.5 K/uL   RBC 3.16 (L) 3.87 - 5.11 MIL/uL   Hemoglobin 9.4 (L) 12.0 - 15.0 g/dL   HCT 28.9 (L) 36.0 - 46.0 %   MCV 91.5 78.0 - 100.0 fL   MCH 29.7 26.0 - 34.0 pg   MCHC 32.5 30.0 - 36.0 g/dL   RDW 13.1 11.5 - 15.5 %   Platelets 196 150 - 400 K/uL    Comment: Performed at [facility], [place] [address], [place], [place] [address]    Blood Alcohol level:  Lab Results  Component Value Date   [facility] <10 06/12/2017   ETH <10 05/25/2017    Musculoskeletal: Strength & Muscle Tone: decreased due to physical deconditioning.  Gait & Station: UTA since patient is sitting in bed. Patient leans: N/A  Psychiatric Specialty Exam: Physical Exam  Nursing note and vitals reviewed. Constitutional: She is oriented to person, place, and time. She appears well-developed and well-nourished.  HENT:  Head: Normocephalic  and atraumatic.  Neck: Normal range of motion.  Respiratory: Effort normal.  Musculoskeletal: Normal range of motion.  Neurological: She is alert and oriented to person, place, and time.  Psychiatric: Judgment and thought content normal. Her mood appears anxious. Her speech is delayed. She is slowed. Cognition and memory are normal. She exhibits a depressed mood.    Review of Systems  Cardiovascular: Negative for chest pain.  Gastrointestinal: Positive for nausea. Negative for abdominal pain, constipation, diarrhea and vomiting.  Musculoskeletal: Positive for back pain.  Psychiatric/Behavioral: Positive for depression. Negative for hallucinations, substance abuse and suicidal ideas. The patient is nervous/anxious.   All other systems reviewed and are negative.   Blood pressure 116/61, pulse 95, temperature 98.2 F (36.8 C), temperature source Oral, resp. rate 15, height 5' 7"  (1.702 m), weight 49 kg, SpO2 97 %.Body mass index is 16.92 kg/m.  General Appearance: Fairly Groomed, thin, elderly, Caucasian female, wearing a hospital gown with her hair in a bun and sitting up in bed. NAD.   Eye Contact:  Poor. She looks down throughout the interview.   Speech:  Clear and Coherent and Normal Rate  Volume:  Decreased  Mood:  Anxious  Affect:  Congruent and Depressed  Thought Process:  Goal Directed, Linear and Descriptions of Associations: Intact  Orientation:  Full (Time, Place, and Person)  Thought Content:  Logical and Obsessions about toileting.   Suicidal Thoughts:  No  Homicidal Thoughts:  No  Memory:  Immediate;   Fair Recent;   Fair Remote;   Fair  Judgement:  Poor  Insight:  Fair  Psychomotor Activity:  Psychomotor Retardation  Concentration:  Concentration: Good and Attention Span: Good  Recall:  [employer] of Knowledge:  Fair  Language:  Good  Akathisia:  No  Handed:  Right  AIMS (if indicated):   N/A  Assets:  Housing Social Support  ADL's:  Impaired  Cognition:  WNL   Sleep:   Fair   Assessment:  Amanda Hall is a 67 y.o. female who was admitted with significant hypokalemia due to poor PO intake. She appears to be more anxious today and perseverates about toileting. She likely appears more anxious due to decreasing her medications which were oversedating. May benefit from small dose Zyprexa PRN for anxiety although it may cause sedation.    Treatment Plan Summary: -Continue Luvox 150 mg daily for depression and anxiety, Ativan 1 mg BID PRN for anxiety (decreased on 8/21 due to oversedation), Remeron 45 mg qhs for depression and anxiety, Trileptal 75 mg BID and 150 mg qhs for mood stabilization and Zyprexa 5 mg qhs for poor appetite and mood stabilization.  -Start Zyprexa 2.5 mg daily PRN for worsening anxiety.  -Patient continues to warrant inpatient psychiatric hospitalization given severe depression and anxiety which impair daily functioning. -Patient may benefit from ECT for refractory depression/anxiety.  -Patient is IVC'd so she is unable to leave hospital and she has a Engineer, materials. -Will sign off on patient at this time. Please consult psychiatry again as needed.     Faythe Dingwall, DO 09/07/2017, 3:14 PM

## 2017-09-08 DIAGNOSIS — K580 Irritable bowel syndrome with diarrhea: Secondary | ICD-10-CM | POA: Diagnosis not present

## 2017-09-08 DIAGNOSIS — M79673 Pain in unspecified foot: Secondary | ICD-10-CM | POA: Diagnosis not present

## 2017-09-08 DIAGNOSIS — R630 Anorexia: Secondary | ICD-10-CM | POA: Diagnosis not present

## 2017-09-08 DIAGNOSIS — F332 Major depressive disorder, recurrent severe without psychotic features: Secondary | ICD-10-CM | POA: Diagnosis present

## 2017-09-08 DIAGNOSIS — E876 Hypokalemia: Secondary | ICD-10-CM | POA: Diagnosis not present

## 2017-09-08 DIAGNOSIS — M25569 Pain in unspecified knee: Secondary | ICD-10-CM | POA: Diagnosis not present

## 2017-09-08 DIAGNOSIS — M6281 Muscle weakness (generalized): Secondary | ICD-10-CM | POA: Diagnosis not present

## 2017-09-08 DIAGNOSIS — Z79899 Other long term (current) drug therapy: Secondary | ICD-10-CM | POA: Diagnosis not present

## 2017-09-08 DIAGNOSIS — R451 Restlessness and agitation: Secondary | ICD-10-CM | POA: Diagnosis not present

## 2017-09-08 LAB — CBC WITH DIFFERENTIAL/PLATELET
BASOS PCT: 0 %
Basophils Absolute: 0 10*3/uL (ref 0.0–0.1)
EOS ABS: 0 10*3/uL (ref 0.0–0.7)
Eosinophils Relative: 0 %
HEMATOCRIT: 33.7 % — AB (ref 36.0–46.0)
Hemoglobin: 11.1 g/dL — ABNORMAL LOW (ref 12.0–15.0)
LYMPHS ABS: 1.5 10*3/uL (ref 0.7–4.0)
Lymphocytes Relative: 31 %
MCH: 30 pg (ref 26.0–34.0)
MCHC: 32.9 g/dL (ref 30.0–36.0)
MCV: 91.1 fL (ref 78.0–100.0)
MONO ABS: 0.4 10*3/uL (ref 0.1–1.0)
MONOS PCT: 9 %
NEUTROS ABS: 2.8 10*3/uL (ref 1.7–7.7)
NEUTROS PCT: 60 %
NRBC: 2 /100{WBCs} — AB
PLATELETS: 247 10*3/uL (ref 150–400)
RBC: 3.7 MIL/uL — ABNORMAL LOW (ref 3.87–5.11)
RDW: 13.3 % (ref 11.5–15.5)
WBC: 4.8 10*3/uL (ref 4.0–10.5)

## 2017-09-08 LAB — IRON AND TIBC
Iron: 28 ug/dL (ref 28–170)
Saturation Ratios: 10 % — ABNORMAL LOW (ref 10.4–31.8)
TIBC: 273 ug/dL (ref 250–450)
UIBC: 245 ug/dL

## 2017-09-08 LAB — TRANSFERRIN: Transferrin: 195 mg/dL (ref 192–382)

## 2017-09-08 MED ORDER — OXCARBAZEPINE 150 MG PO TABS: 75.0000 mg | ORAL_TABLET | Freq: Two times a day (BID) | ORAL | 0 refills | Status: DC

## 2017-09-08 MED ORDER — LORAZEPAM 1 MG PO TABS: 1.0000 mg | ORAL_TABLET | Freq: Two times a day (BID) | ORAL | 0 refills | Status: DC

## 2017-09-08 MED ORDER — OXYMETAZOLINE HCL 0.05 % NA SOLN
2.0000 | Freq: Two times a day (BID) | NASAL | Status: DC | PRN
  Administered 2017-09-08: 2 via NASAL
  Filled 2017-09-08: qty 15

## 2017-09-08 MED ORDER — MIRTAZAPINE 45 MG PO TABS: 45.0000 mg | ORAL_TABLET | Freq: Every day | ORAL | 0 refills | Status: DC

## 2017-09-08 MED ORDER — OLANZAPINE 5 MG PO TABS: 5.0000 mg | ORAL_TABLET | Freq: Every day | ORAL | 0 refills | Status: DC

## 2017-09-08 MED ORDER — ZOLPIDEM TARTRATE 5 MG PO TABS
5.0000 mg | ORAL_TABLET | Freq: Once | ORAL | Status: AC
  Administered 2017-09-08: 5 mg via ORAL
  Filled 2017-09-08: qty 1

## 2017-09-08 MED ORDER — OLANZAPINE 2.5 MG PO TABS: 2.5000 mg | ORAL_TABLET | Freq: Every day | ORAL | 0 refills | Status: DC | PRN

## 2017-09-08 MED ORDER — FLUVOXAMINE MALEATE 50 MG PO TABS: 150.0000 mg | ORAL_TABLET | Freq: Every day | ORAL | 0 refills | Status: DC

## 2017-09-08 NOTE — Progress Notes (Addendum)
[facility]   Patient will report to [address]  Accepting MD: Dr. Avanell ShackletonAngela Gannon  [facility]. Patient [facility]'d  RN report number #: [id number]  Please call Sheriff for transport once patient is served by  [employer] [phone]  BKJ

## 2017-09-08 NOTE — Progress Notes (Signed)
Report from [place], [place]. Care assumed for pt at this time. Pt given paper scrubs for transport to [facility].  Ambulating about room with sitter present. No c/o at present. Assessment unchanged from previous RN assessment.

## 2017-09-08 NOTE — Progress Notes (Signed)
Patient continues to require inpatient psych after new evaluation.   LCSW faxed patient out to the following facilities.    [facility]  LCSW will continue to follow for placement.   Patient's IVC expired 8/28, LCSW will pursue IVC if it remains necessary.   Beulah GandyBernette Denia Mcvicar, LSCW [facility] CSW [phone]

## 2017-09-08 NOTE — Discharge Summary (Signed)
Physician Discharge Summary  Amanda BreezeDana Hall ZOX:[id number] DOB: 1950-11-01 DOA: 08/28/2017  PCP: Gordy SaversKwiatkowski, Peter F, MD  Admit date: 08/28/2017 Discharge date: 09/08/2017  Time spent: 20 minutes  Recommendations for Outpatient Follow-up:  1. Needs Geri-psych placement 2. Needs bmet 1 week  Discharge Diagnoses:  Principal Problem:   MDD (major depressive disorder), recurrent severe, without psychosis (HCC) Active Problems:   Depression   Hypercholesterolemia   Generalized anxiety disorder   Hypokalemia   Protein-calorie malnutrition, severe   Discharge Condition: improved  Diet recommendation: as much as tolerated  Filed Weights   08/28/17 2203  Weight: 49 kg    History of present illness:  Amanda Hall is a 67 y.o. femalewith medical history significant ofdepression anxiety as well as bipolar disorder was initially brought into the hospital with complaint of manic behavior with agitation. Psychiatry evaluated and recommends inpatient behavioral admission.   Hospital Course:  Hypokalemia Potassium was ~ 3.4--needs rechekcs in about 1 week  Normocytic anemia-hemoglobin 14.58/21 currently 9.4-no reports of overt bleeding although patient really not eating much. Iron level low--should improve with proper diet once severe depression better-conisde rOP ferrous  Hypomagnesemia Resolved  Depression Psychiatry recommending inpatient admission. Patient IVCd on 08/31/17. -Continue Zyprexa, Remeron, Trileptal, Luvox 150 --[facility] -Psych to reevaluate as per social work note 8/28  Severe protein calorie malnutrition  Excessive sleepiness    Discharge Exam: Vitals:   09/07/17 2027 09/08/17 0518  BP: (!) 160/72 (!) 153/77  Pulse: (!) 112 95  Resp: 16 17  Temp: 99 F (37.2 C) (!) 97.5 F (36.4 C)  SpO2: 98% 99%    General: awake anxious no distress--some bizarre thoguths-didn't sleep well last pm Cardiovascular: s1 s2 no  m/r/g Respiratory: clear no added sound  Discharge Instructions   Discharge Instructions    Diet - low sodium heart healthy   Complete by:  As directed    Increase activity slowly   Complete by:  As directed      Allergies as of 09/08/2017      Reactions   Codeine Other (See Comments)   "makes head fell crazy"   Sulfonamide Derivatives Nausea Only   Tetanus Toxoid Other (See Comments)   "Made a Knot"      Medication List    STOP taking these medications   QUEtiapine 50 MG tablet Commonly known as:  SEROQUEL     TAKE these medications   atorvastatin 20 MG tablet Commonly known as:  LIPITOR Take 20 mg by mouth daily.   fluvoxaMINE 50 MG tablet Commonly known as:  LUVOX Take 3 tablets (150 mg total) by mouth at bedtime. For mood control   hydrocortisone cream 1 % Apply to affected area 2 times daily   LORazepam 1 MG tablet Commonly known as:  ATIVAN Take 1 tablet (1 mg total) by mouth 2 (two) times daily.   megestrol 40 MG tablet Commonly known as:  MEGACE Take 1 tablet (40 mg total) by mouth daily.   mirtazapine 45 MG tablet Commonly known as:  REMERON Take 1 tablet (45 mg total) by mouth at bedtime. For mood control   OLANZapine 2.5 MG tablet Commonly known as:  ZYPREXA Take 1 tablet (2.5 mg total) by mouth daily as needed (agitation).   OLANZapine 5 MG tablet Commonly known as:  ZYPREXA Take 1 tablet (5 mg total) by mouth at bedtime.   OXcarbazepine 150 MG tablet Commonly known as:  TRILEPTAL Take 0.5 tablets (75 mg total) by  mouth 2 (two) times daily. Mood control What changed:  Another medication with the same name was removed. Continue taking this medication, and follow the directions you see here.   polyethylene glycol packet Commonly known as:  MIRALAX / GLYCOLAX Take 17 g by mouth 2 (two) times daily. What changed:    when to take this  reasons to take this      Allergies  Allergen Reactions  . Codeine Other (See Comments)    "makes  head fell crazy"  . Sulfonamide Derivatives Nausea Only  . Tetanus Toxoid Other (See Comments)    "Made a Knot"      The results of significant diagnostics from this hospitalization (including imaging, microbiology, ancillary and laboratory) are listed below for reference.    Significant Diagnostic Studies: Dg Chest Port 1 View  Result Date: 08/28/2017 CLINICAL DATA:  67 year old female with altered mental status for 3 days. EXAM: PORTABLE CHEST 1 VIEW COMPARISON:  Portable chest 05/25/2017 and earlier. FINDINGS: Portable AP semi upright view at 2002 hours. Stable lung volumes and mediastinal contours. Allowing for portable technique the lungs are clear. Visualized tracheal air column is within normal limits. No acute osseous abnormality identified. IMPRESSION: No acute cardiopulmonary abnormality. Electronically Signed   By: Odessa Fleming M.D.   On: 08/28/2017 20:52    Microbiology: No results found for this or any previous visit (from the past 240 hour(s)).   Labs: Basic Metabolic Panel: Recent Labs  Lab 09/02/17 1035 09/04/17 0524 09/07/17 0932  NA 139  --  144  K 4.1  --  3.4*  CL 105  --  106  CO2 27  --  32  GLUCOSE 131*  --  96  BUN 22  --  19  CREATININE 0.93 0.86 0.93  CALCIUM 9.6  --  9.7  MG 1.9  --  2.1   Liver Function Tests: Recent Labs  Lab 09/07/17 0932  AST 25  ALT 17  ALKPHOS 60  BILITOT 0.2*  PROT 4.8*  ALBUMIN 2.8*   No results for input(s): LIPASE, AMYLASE in the last 168 hours. No results for input(s): AMMONIA in the last 168 hours. CBC: Recent Labs  Lab 09/07/17 0932 09/08/17 0514  WBC 3.6* 4.8  NEUTROABS  --  2.8  HGB 9.4* 11.1*  HCT 28.9* 33.7*  MCV 91.5 91.1  PLT 196 247   Cardiac Enzymes: No results for input(s): CKTOTAL, CKMB, CKMBINDEX, TROPONINI in the last 168 hours. BNP: BNP (last 3 results) No results for input(s): BNP in the last 8760 hours.  ProBNP (last 3 results) No results for input(s): PROBNP in the last 8760  hours.  CBG: No results for input(s): GLUCAP in the last 168 hours.     Signed:  Rhetta Mura MD   [facility] 09/08/2017, 11:49 AM

## 2017-09-08 NOTE — Care Management Important Message (Signed)
Important Message  Patient Details  Name: Amanda Hall MRN: [id number] Date of Birth: 06/21/1950   Medicare Important Message Given:  Yes    [name], [name] 09/08/2017, 10:55 AMImportant Message  Patient Details  Name: Amanda Hall MRN: [id number] Date of Birth: 06/21/1950   Medicare Important Message Given:  Yes    [name], [name] 09/08/2017, 10:55 AM

## 2017-09-08 NOTE — Progress Notes (Signed)
[facility] called to [place] at [facility]. Sheriff has been called for transport. Sheriff will call unit when they are on the way. Patient stable for discharge.   Amanda Hall, Amanda CopaJessica L 09/08/2017 3:44 PM

## 2017-09-08 NOTE — Progress Notes (Signed)
Pt left unit with sheriff at this time for transport to [facility]. Pt in stable condition, sheriff in possession of all belongings. Marylene LandAngela, at [facility] called and made aware.

## 2017-10-05 ENCOUNTER — Emergency Department (HOSPITAL_COMMUNITY): Payer: Medicare Other

## 2017-10-05 ENCOUNTER — Encounter (HOSPITAL_COMMUNITY): Payer: Self-pay

## 2017-10-05 ENCOUNTER — Other Ambulatory Visit: Payer: Self-pay

## 2017-10-05 ENCOUNTER — Inpatient Hospital Stay (HOSPITAL_COMMUNITY)
Admission: EM | Admit: 2017-10-05 | Discharge: 2018-01-02 | DRG: 682 | Disposition: A | Discharge status: Hospice | Payer: Medicare Other | Attending: Internal Medicine | Admitting: Internal Medicine

## 2017-10-05 DIAGNOSIS — E854 Organ-limited amyloidosis: Secondary | ICD-10-CM | POA: Diagnosis present

## 2017-10-05 DIAGNOSIS — Z9119 Patient's noncompliance with other medical treatment and regimen: Secondary | ICD-10-CM

## 2017-10-05 DIAGNOSIS — N179 Acute kidney failure, unspecified: Principal | ICD-10-CM

## 2017-10-05 DIAGNOSIS — N19 Unspecified kidney failure: Secondary | ICD-10-CM | POA: Diagnosis present

## 2017-10-05 DIAGNOSIS — A419 Sepsis, unspecified organism: Secondary | ICD-10-CM | POA: Diagnosis not present

## 2017-10-05 DIAGNOSIS — R4189 Other symptoms and signs involving cognitive functions and awareness: Secondary | ICD-10-CM | POA: Diagnosis present

## 2017-10-05 DIAGNOSIS — E872 Acidosis: Secondary | ICD-10-CM | POA: Diagnosis not present

## 2017-10-05 DIAGNOSIS — R05 Cough: Secondary | ICD-10-CM | POA: Diagnosis not present

## 2017-10-05 DIAGNOSIS — K5900 Constipation, unspecified: Secondary | ICD-10-CM | POA: Diagnosis present

## 2017-10-05 DIAGNOSIS — R64 Cachexia: Secondary | ICD-10-CM | POA: Diagnosis not present

## 2017-10-05 DIAGNOSIS — E876 Hypokalemia: Secondary | ICD-10-CM | POA: Diagnosis present

## 2017-10-05 DIAGNOSIS — J9601 Acute respiratory failure with hypoxia: Secondary | ICD-10-CM | POA: Diagnosis not present

## 2017-10-05 DIAGNOSIS — R4020 Unspecified coma: Secondary | ICD-10-CM | POA: Diagnosis not present

## 2017-10-05 DIAGNOSIS — J189 Pneumonia, unspecified organism: Secondary | ICD-10-CM

## 2017-10-05 DIAGNOSIS — D509 Iron deficiency anemia, unspecified: Secondary | ICD-10-CM | POA: Diagnosis present

## 2017-10-05 DIAGNOSIS — J690 Pneumonitis due to inhalation of food and vomit: Secondary | ICD-10-CM | POA: Diagnosis not present

## 2017-10-05 DIAGNOSIS — R630 Anorexia: Secondary | ICD-10-CM | POA: Diagnosis not present

## 2017-10-05 DIAGNOSIS — I313 Pericardial effusion (noninflammatory): Secondary | ICD-10-CM | POA: Diagnosis present

## 2017-10-05 DIAGNOSIS — F5089 Other specified eating disorder: Secondary | ICD-10-CM | POA: Diagnosis present

## 2017-10-05 DIAGNOSIS — Z823 Family history of stroke: Secondary | ICD-10-CM

## 2017-10-05 DIAGNOSIS — D751 Secondary polycythemia: Secondary | ICD-10-CM | POA: Diagnosis present

## 2017-10-05 DIAGNOSIS — R059 Cough, unspecified: Secondary | ICD-10-CM

## 2017-10-05 DIAGNOSIS — F419 Anxiety disorder, unspecified: Secondary | ICD-10-CM | POA: Diagnosis not present

## 2017-10-05 DIAGNOSIS — Z7189 Other specified counseling: Secondary | ICD-10-CM | POA: Diagnosis not present

## 2017-10-05 DIAGNOSIS — J439 Emphysema, unspecified: Secondary | ICD-10-CM | POA: Diagnosis not present

## 2017-10-05 DIAGNOSIS — I43 Cardiomyopathy in diseases classified elsewhere: Secondary | ICD-10-CM | POA: Diagnosis not present

## 2017-10-05 DIAGNOSIS — R609 Edema, unspecified: Secondary | ICD-10-CM | POA: Diagnosis present

## 2017-10-05 DIAGNOSIS — Z681 Body mass index (BMI) 19 or less, adult: Secondary | ICD-10-CM

## 2017-10-05 DIAGNOSIS — Z887 Allergy status to serum and vaccine status: Secondary | ICD-10-CM

## 2017-10-05 DIAGNOSIS — N390 Urinary tract infection, site not specified: Secondary | ICD-10-CM | POA: Diagnosis not present

## 2017-10-05 DIAGNOSIS — I351 Nonrheumatic aortic (valve) insufficiency: Secondary | ICD-10-CM | POA: Diagnosis not present

## 2017-10-05 DIAGNOSIS — Z882 Allergy status to sulfonamides status: Secondary | ICD-10-CM

## 2017-10-05 DIAGNOSIS — R0902 Hypoxemia: Secondary | ICD-10-CM | POA: Diagnosis not present

## 2017-10-05 DIAGNOSIS — R41843 Psychomotor deficit: Secondary | ICD-10-CM | POA: Diagnosis present

## 2017-10-05 DIAGNOSIS — F332 Major depressive disorder, recurrent severe without psychotic features: Secondary | ICD-10-CM | POA: Diagnosis present

## 2017-10-05 DIAGNOSIS — G9341 Metabolic encephalopathy: Secondary | ICD-10-CM | POA: Diagnosis present

## 2017-10-05 DIAGNOSIS — Z818 Family history of other mental and behavioral disorders: Secondary | ICD-10-CM

## 2017-10-05 DIAGNOSIS — Z7401 Bed confinement status: Secondary | ICD-10-CM

## 2017-10-05 DIAGNOSIS — R451 Restlessness and agitation: Secondary | ICD-10-CM | POA: Diagnosis present

## 2017-10-05 DIAGNOSIS — I959 Hypotension, unspecified: Secondary | ICD-10-CM | POA: Diagnosis not present

## 2017-10-05 DIAGNOSIS — G9340 Encephalopathy, unspecified: Secondary | ICD-10-CM | POA: Diagnosis present

## 2017-10-05 DIAGNOSIS — R000 Tachycardia, unspecified: Secondary | ICD-10-CM | POA: Diagnosis not present

## 2017-10-05 DIAGNOSIS — F339 Major depressive disorder, recurrent, unspecified: Secondary | ICD-10-CM | POA: Diagnosis not present

## 2017-10-05 DIAGNOSIS — Z4682 Encounter for fitting and adjustment of non-vascular catheter: Secondary | ICD-10-CM | POA: Diagnosis not present

## 2017-10-05 DIAGNOSIS — Z515 Encounter for palliative care: Secondary | ICD-10-CM | POA: Diagnosis not present

## 2017-10-05 DIAGNOSIS — E43 Unspecified severe protein-calorie malnutrition: Secondary | ICD-10-CM | POA: Diagnosis not present

## 2017-10-05 DIAGNOSIS — E870 Hyperosmolality and hypernatremia: Secondary | ICD-10-CM | POA: Diagnosis present

## 2017-10-05 DIAGNOSIS — R0689 Other abnormalities of breathing: Secondary | ICD-10-CM | POA: Diagnosis not present

## 2017-10-05 DIAGNOSIS — R627 Adult failure to thrive: Secondary | ICD-10-CM

## 2017-10-05 DIAGNOSIS — F329 Major depressive disorder, single episode, unspecified: Secondary | ICD-10-CM | POA: Diagnosis present

## 2017-10-05 DIAGNOSIS — F418 Other specified anxiety disorders: Secondary | ICD-10-CM | POA: Diagnosis present

## 2017-10-05 DIAGNOSIS — Z22322 Carrier or suspected carrier of Methicillin resistant Staphylococcus aureus: Secondary | ICD-10-CM

## 2017-10-05 DIAGNOSIS — Z66 Do not resuscitate: Secondary | ICD-10-CM | POA: Diagnosis not present

## 2017-10-05 DIAGNOSIS — J302 Other seasonal allergic rhinitis: Secondary | ICD-10-CM | POA: Diagnosis present

## 2017-10-05 DIAGNOSIS — L89151 Pressure ulcer of sacral region, stage 1: Secondary | ICD-10-CM | POA: Diagnosis present

## 2017-10-05 DIAGNOSIS — Z8249 Family history of ischemic heart disease and other diseases of the circulatory system: Secondary | ICD-10-CM

## 2017-10-05 DIAGNOSIS — Z79899 Other long term (current) drug therapy: Secondary | ICD-10-CM

## 2017-10-05 DIAGNOSIS — R6510 Systemic inflammatory response syndrome (SIRS) of non-infectious origin without acute organ dysfunction: Secondary | ICD-10-CM | POA: Diagnosis not present

## 2017-10-05 DIAGNOSIS — E785 Hyperlipidemia, unspecified: Secondary | ICD-10-CM | POA: Diagnosis not present

## 2017-10-05 DIAGNOSIS — J9610 Chronic respiratory failure, unspecified whether with hypoxia or hypercapnia: Secondary | ICD-10-CM | POA: Diagnosis not present

## 2017-10-05 DIAGNOSIS — L8990 Pressure ulcer of unspecified site, unspecified stage: Secondary | ICD-10-CM

## 2017-10-05 DIAGNOSIS — R931 Abnormal findings on diagnostic imaging of heart and coronary circulation: Secondary | ICD-10-CM | POA: Diagnosis not present

## 2017-10-05 DIAGNOSIS — R410 Disorientation, unspecified: Secondary | ICD-10-CM | POA: Diagnosis not present

## 2017-10-05 DIAGNOSIS — R1310 Dysphagia, unspecified: Secondary | ICD-10-CM

## 2017-10-05 DIAGNOSIS — R262 Difficulty in walking, not elsewhere classified: Secondary | ICD-10-CM | POA: Diagnosis present

## 2017-10-05 DIAGNOSIS — R5381 Other malaise: Secondary | ICD-10-CM | POA: Diagnosis not present

## 2017-10-05 DIAGNOSIS — R9431 Abnormal electrocardiogram [ECG] [EKG]: Secondary | ICD-10-CM | POA: Diagnosis not present

## 2017-10-05 DIAGNOSIS — Z885 Allergy status to narcotic agent status: Secondary | ICD-10-CM

## 2017-10-05 DIAGNOSIS — L89322 Pressure ulcer of left buttock, stage 2: Secondary | ICD-10-CM | POA: Diagnosis not present

## 2017-10-05 DIAGNOSIS — R160 Hepatomegaly, not elsewhere classified: Secondary | ICD-10-CM | POA: Diagnosis not present

## 2017-10-05 DIAGNOSIS — N171 Acute kidney failure with acute cortical necrosis: Secondary | ICD-10-CM | POA: Diagnosis not present

## 2017-10-05 LAB — CBC WITH DIFFERENTIAL/PLATELET
ABS IMMATURE GRANULOCYTES: 0 10*3/uL (ref 0.0–0.1)
BASOS ABS: 0 10*3/uL (ref 0.0–0.1)
Basophils Relative: 0 %
EOS PCT: 0 %
Eosinophils Absolute: 0 10*3/uL (ref 0.0–0.7)
HEMATOCRIT: 47.7 % — AB (ref 36.0–46.0)
HEMOGLOBIN: 15.6 g/dL — AB (ref 12.0–15.0)
Immature Granulocytes: 0 %
LYMPHS ABS: 1.4 10*3/uL (ref 0.7–4.0)
LYMPHS PCT: 12 %
MCH: 29.9 pg (ref 26.0–34.0)
MCHC: 32.7 g/dL (ref 30.0–36.0)
MCV: 91.6 fL (ref 78.0–100.0)
MONO ABS: 0.5 10*3/uL (ref 0.1–1.0)
MONOS PCT: 4 %
NEUTROS ABS: 9.6 10*3/uL — AB (ref 1.7–7.7)
Neutrophils Relative %: 84 %
Platelets: 224 10*3/uL (ref 150–400)
RBC: 5.21 MIL/uL — ABNORMAL HIGH (ref 3.87–5.11)
RDW: 13.5 % (ref 11.5–15.5)
WBC: 11.5 10*3/uL — ABNORMAL HIGH (ref 4.0–10.5)

## 2017-10-05 LAB — BASIC METABOLIC PANEL
ANION GAP: 13 (ref 5–15)
BUN: 96 mg/dL — ABNORMAL HIGH (ref 8–23)
CALCIUM: 10.6 mg/dL — AB (ref 8.9–10.3)
CO2: 25 mmol/L (ref 22–32)
Chloride: 114 mmol/L — ABNORMAL HIGH (ref 98–111)
Creatinine, Ser: 1.98 mg/dL — ABNORMAL HIGH (ref 0.44–1.00)
GFR, EST AFRICAN AMERICAN: 29 mL/min — AB (ref >60)
GFR, EST NON AFRICAN AMERICAN: 25 mL/min — AB (ref >60)
Glucose, Bld: 127 mg/dL — ABNORMAL HIGH (ref 70–99)
Potassium: 3.5 mmol/L (ref 3.5–5.1)
SODIUM: 152 mmol/L — AB (ref 135–145)

## 2017-10-05 LAB — URINALYSIS, ROUTINE W REFLEX MICROSCOPIC
Bilirubin Urine: NEGATIVE
Glucose, UA: NEGATIVE mg/dL
HGB URINE DIPSTICK: NEGATIVE
Ketones, ur: NEGATIVE mg/dL
Leukocytes, UA: NEGATIVE
Nitrite: NEGATIVE
PH: 5 (ref 5.0–8.0)
Protein, ur: NEGATIVE mg/dL
SPECIFIC GRAVITY, URINE: 1.021 (ref 1.005–1.030)

## 2017-10-05 LAB — I-STAT CHEM 8, ED
BUN: 94 mg/dL — AB (ref 8–23)
CHLORIDE: 110 mmol/L (ref 98–111)
Calcium, Ion: 1.34 mmol/L (ref 1.15–1.40)
Creatinine, Ser: 2.2 mg/dL — ABNORMAL HIGH (ref 0.44–1.00)
GLUCOSE: 194 mg/dL — AB (ref 70–99)
HCT: 44 % (ref 36.0–46.0)
Hemoglobin: 15 g/dL (ref 12.0–15.0)
POTASSIUM: 3.2 mmol/L — AB (ref 3.5–5.1)
Sodium: 151 mmol/L — ABNORMAL HIGH (ref 135–145)
TCO2: 33 mmol/L — ABNORMAL HIGH (ref 22–32)

## 2017-10-05 LAB — COMPREHENSIVE METABOLIC PANEL
ALBUMIN: 3.7 g/dL (ref 3.5–5.0)
ALT: 26 U/L (ref 0–44)
ANION GAP: 16 — AB (ref 5–15)
AST: 41 U/L (ref 15–41)
Alkaline Phosphatase: 85 U/L (ref 38–126)
BUN: 97 mg/dL — AB (ref 8–23)
CHLORIDE: 109 mmol/L (ref 98–111)
CO2: 27 mmol/L (ref 22–32)
Calcium: 11.7 mg/dL — ABNORMAL HIGH (ref 8.9–10.3)
Creatinine, Ser: 2.07 mg/dL — ABNORMAL HIGH (ref 0.44–1.00)
GFR calc Af Amer: 28 mL/min — ABNORMAL LOW (ref >60)
GFR calc non Af Amer: 24 mL/min — ABNORMAL LOW (ref >60)
Glucose, Bld: 192 mg/dL — ABNORMAL HIGH (ref 70–99)
POTASSIUM: 3.1 mmol/L — AB (ref 3.5–5.1)
SODIUM: 152 mmol/L — AB (ref 135–145)
Total Bilirubin: 1.1 mg/dL (ref 0.3–1.2)
Total Protein: 6.7 g/dL (ref 6.5–8.1)

## 2017-10-05 LAB — TSH: TSH: 3.52 u[IU]/mL (ref 0.350–4.500)

## 2017-10-05 LAB — LACTIC ACID, PLASMA: LACTIC ACID, VENOUS: 3.4 mmol/L — AB (ref 0.5–1.9)

## 2017-10-05 LAB — I-STAT CG4 LACTIC ACID, ED: LACTIC ACID, VENOUS: 2.49 mmol/L — AB (ref 0.5–1.9)

## 2017-10-05 LAB — MAGNESIUM: MAGNESIUM: 2.5 mg/dL — AB (ref 1.7–2.4)

## 2017-10-05 LAB — GLUCOSE, CAPILLARY: GLUCOSE-CAPILLARY: 84 mg/dL (ref 70–99)

## 2017-10-05 MED ORDER — NAPHAZOLINE-GLYCERIN 0.012-0.2 % OP SOLN: 2.0000 [drp] | Freq: Four times a day (QID) | OPHTHALMIC | Status: DC | PRN

## 2017-10-05 MED ORDER — INSULIN ASPART 100 UNIT/ML ~~LOC~~ SOLN: 0.0000 [IU] | Freq: Every day | SUBCUTANEOUS | Status: DC

## 2017-10-05 MED ORDER — FLUVOXAMINE MALEATE 50 MG PO TABS
150.0000 mg | ORAL_TABLET | Freq: Every day | ORAL | Status: DC
  Administered 2017-10-06 – 2017-10-22 (×15): 150 mg via ORAL
  Filled 2017-10-05 (×20): qty 1

## 2017-10-05 MED ORDER — SODIUM CHLORIDE 0.9 % IV BOLUS
500.0000 mL | Freq: Once | INTRAVENOUS | Status: AC
  Administered 2017-10-05: 500 mL via INTRAVENOUS

## 2017-10-05 MED ORDER — MEGESTROL ACETATE 40 MG PO TABS
40.0000 mg | ORAL_TABLET | Freq: Every day | ORAL | Status: DC
  Filled 2017-10-05 (×2): qty 1

## 2017-10-05 MED ORDER — LORAZEPAM 2 MG/ML IJ SOLN
1.0000 mg | Freq: Once | INTRAMUSCULAR | Status: AC
  Administered 2017-10-05: 1 mg via INTRAVENOUS
  Filled 2017-10-05: qty 1

## 2017-10-05 MED ORDER — INSULIN ASPART 100 UNIT/ML ~~LOC~~ SOLN
0.0000 [IU] | Freq: Three times a day (TID) | SUBCUTANEOUS | Status: DC
  Administered 2017-10-08 – 2017-10-09 (×2): 1 [IU] via SUBCUTANEOUS
  Administered 2017-10-09: 3 [IU] via SUBCUTANEOUS
  Administered 2017-10-10 – 2017-10-12 (×4): 1 [IU] via SUBCUTANEOUS

## 2017-10-05 MED ORDER — ONDANSETRON HCL 4 MG/2ML IJ SOLN: 4.0000 mg | Freq: Four times a day (QID) | INTRAMUSCULAR | Status: DC | PRN

## 2017-10-05 MED ORDER — VENLAFAXINE HCL ER 37.5 MG PO CP24
37.5000 mg | ORAL_CAPSULE | Freq: Every day | ORAL | Status: DC
Start: 2017-10-06 — End: 2017-10-18
  Administered 2017-10-08 – 2017-10-17 (×10): 37.5 mg via ORAL
  Filled 2017-10-05 (×13): qty 1

## 2017-10-05 MED ORDER — ONDANSETRON HCL 4 MG PO TABS: 4.0000 mg | ORAL_TABLET | Freq: Four times a day (QID) | ORAL | Status: DC | PRN

## 2017-10-05 MED ORDER — SODIUM CHLORIDE 0.45 % IV SOLN
INTRAVENOUS | Status: DC
  Administered 2017-10-06: via INTRAVENOUS

## 2017-10-05 MED ORDER — OLANZAPINE 5 MG PO TBDP
10.0000 mg | ORAL_TABLET | Freq: Every day | ORAL | Status: DC
  Administered 2017-10-06 – 2017-10-13 (×7): 10 mg via ORAL
  Filled 2017-10-05 (×10): qty 2

## 2017-10-05 MED ORDER — LACTATED RINGERS IV BOLUS
1000.0000 mL | Freq: Once | INTRAVENOUS | Status: AC
  Administered 2017-10-05: 1000 mL via INTRAVENOUS

## 2017-10-05 MED ORDER — HEPARIN SODIUM (PORCINE) 5000 UNIT/ML IJ SOLN
5000.0000 [IU] | Freq: Three times a day (TID) | INTRAMUSCULAR | Status: DC
  Administered 2017-10-05 – 2017-10-12 (×12): 5000 [IU] via SUBCUTANEOUS
  Filled 2017-10-05 (×17): qty 1

## 2017-10-05 MED ORDER — SODIUM CHLORIDE 0.9% FLUSH
3.0000 mL | Freq: Two times a day (BID) | INTRAVENOUS | Status: DC
  Administered 2017-10-06 – 2017-10-20 (×11): 3 mL via INTRAVENOUS
  Administered 2017-10-20: 10 mL via INTRAVENOUS
  Administered 2017-10-21 – 2017-11-09 (×13): 3 mL via INTRAVENOUS
  Administered 2017-11-10: 10 mL via INTRAVENOUS
  Administered 2017-11-10 – 2017-11-15 (×11): 3 mL via INTRAVENOUS
  Administered 2017-11-16: 10 mL via INTRAVENOUS
  Administered 2017-11-16 – 2017-11-30 (×24): 3 mL via INTRAVENOUS
  Administered 2017-12-01: 10 mL via INTRAVENOUS
  Administered 2017-12-02 – 2018-01-02 (×52): 3 mL via INTRAVENOUS

## 2017-10-05 MED ORDER — SODIUM CHLORIDE 0.9 % IV BOLUS
1000.0000 mL | Freq: Once | INTRAVENOUS | Status: AC
  Administered 2017-10-05: 1000 mL via INTRAVENOUS

## 2017-10-05 MED ORDER — POTASSIUM CHLORIDE 10 MEQ/100ML IV SOLN: 10.0000 meq | Freq: Once | INTRAVENOUS | Status: DC

## 2017-10-05 MED ORDER — MIRTAZAPINE 30 MG PO TBDP
45.0000 mg | ORAL_TABLET | Freq: Every day | ORAL | Status: DC
  Administered 2017-10-06 – 2017-10-22 (×15): 45 mg via ORAL
  Filled 2017-10-05 (×19): qty 1

## 2017-10-05 MED ORDER — ACETAMINOPHEN 325 MG PO TABS
650.0000 mg | ORAL_TABLET | Freq: Four times a day (QID) | ORAL | Status: DC | PRN
  Administered 2017-11-05 – 2017-12-16 (×22): 650 mg via ORAL
  Filled 2017-10-05 (×23): qty 2

## 2017-10-05 MED ORDER — SODIUM CHLORIDE 0.9 % IV SOLN: INTRAVENOUS | Status: DC

## 2017-10-05 MED ORDER — ACETAMINOPHEN 650 MG RE SUPP
650.0000 mg | Freq: Four times a day (QID) | RECTAL | Status: DC | PRN
  Filled 2017-10-05: qty 1

## 2017-10-05 MED ORDER — POTASSIUM CHLORIDE 10 MEQ/100ML IV SOLN
10.0000 meq | INTRAVENOUS | Status: AC
  Administered 2017-10-05 (×2): 10 meq via INTRAVENOUS
  Filled 2017-10-05 (×2): qty 100

## 2017-10-05 MED ORDER — SIMVASTATIN 10 MG PO TABS
10.0000 mg | ORAL_TABLET | Freq: Every day | ORAL | Status: DC
  Administered 2017-10-06 – 2017-10-23 (×17): 10 mg via ORAL
  Filled 2017-10-05 (×18): qty 1

## 2017-10-05 NOTE — Progress Notes (Signed)
Pt is lethargic but alert to voice. Unable to answers questions or take any medication at this time. [place] Carbo, RN

## 2017-10-05 NOTE — Progress Notes (Signed)
Attempted report. Awaiting call back.

## 2017-10-05 NOTE — H&P (Signed)
History and Physical    Amanda Hall ZOX:[id number] DOB: 02-26-1950 DOA: 10/05/2017  PCP: [name], MD   Patient coming from: Home   Chief Complaint: Increased confusion and agitation, refusing medications, not eating or drinking   HPI: Amanda Hall is a 67 y.o. female with medical history significant for depression with anxiety and recent inpatient psychiatry admission, now presenting to the emergency department for evaluation of increased confusion, agitation, refusal to take her medications, and not eating or drinking.  Patient was admitted to inpatient psychiatry approximately 1 month ago and has been living with her sister since discharge.  She has reportedly been refusing her medications, not eating or drinking, and becoming increasingly confused and agitated.  Patient is sedated after receiving IV Ativan in the ED due to agitation and unable to contribute to the history.  She had not been expressing any specific complaints per report.  No reported trauma or fall recently.  ED Course: Upon arrival to the ED, patient is found to be afebrile, saturating well on room air, tachypnea, tachycardic, and with blood pressure 89/55.  EKG features sinus tachycardia with rate 135, LVH, and nonspecific T wave abnormality.  Head CT is negative for acute intracranial abnormality and chest x-ray is negative for acute findings.  Chemistry panel is notable for sodium of 152, potassium 3.1, BUN 97, and creatinine of 2.07, up from 0.93 last month.  CBC features a mild leukocytosis and mild polycythemia.  Lactic acid is elevated to 2.49 and urinalysis is unremarkable.  Patient was given 2 L of IV fluids, 20 mEq of IV potassium, 1 mg IV Ativan, and blood and urine cultures were collected in the ED.  Tachycardia has improved, blood pressure remained stable, and the patient is sedated after Ativan.  She will be admitted for ongoing evaluation and management of increased confusion and agitation with anorexia and  acute kidney injury.  Review of Systems:  All other systems reviewed and apart from HPI, are negative.  Past Medical History:  Diagnosis Date  . Allergy   . Anemia   . Anxiety   . Depression   . Hemorrhoids     Past Surgical History:  Procedure Laterality Date  . arm surgery     Left  . TUBAL LIGATION       reports that she has never smoked. She has never used smokeless tobacco. She reports that she does not drink alcohol or use drugs.  Allergies  Allergen Reactions  . Codeine Other (See Comments)    "Makes my head feel crazy"  . Sulfonamide Derivatives Nausea Only  . Tetanus Toxoid Other (See Comments)    "Made a Knot" (at site)    Family History  Problem Relation Age of Onset  . Stroke Mother   . Hypertension Sister   . Cancer Neg Hx        breat and colon hx  . Diabetes Neg Hx        family     Prior to Admission medications   Medication Sig Start Date End Date Taking? Authorizing Provider  fluvoxaMINE (LUVOX) 50 MG tablet Take 3 tablets (150 mg total) by mouth at bedtime. For mood control 09/08/17  Yes Rhetta Mura, MD  Lactobacillus Rhamnosus, GG, (CULTURELLE) CAPS Take 1 capsule by mouth daily.   Yes [provider]  mirtazapine (REMERON SOL-TAB) 45 MG disintegrating tablet Take 45 mg by mouth at bedtime.   Yes [provider]  OLANZapine zydis (ZYPREXA) 10 MG disintegrating tablet  Take 10 mg by mouth at bedtime.   Yes [provider]  simvastatin (ZOCOR) 40 MG tablet Take 40 mg by mouth daily.  09/15/17  Yes [provider]  tetrahydrozoline-zinc (VISINE-AC) 0.05-0.25 % ophthalmic solution Place 2 drops into both eyes 3 (three) times daily as needed (for dryness).   Yes [provider]  venlafaxine XR (EFFEXOR-XR) 37.5 MG 24 hr capsule Take 75 mg by mouth daily with breakfast.    Yes [provider]  hydrocortisone cream 1 % Apply to affected area 2 times daily Patient taking differently: Apply 1  application topically 2 (two) times daily as needed (for anal itching).  04/18/17   Palumbo, April, MD  LORazepam (ATIVAN) 1 MG tablet Take 1 tablet (1 mg total) by mouth 2 (two) times daily. Patient not taking: Reported on 10/05/2017 09/08/17   Rhetta Mura, MD  megestrol (MEGACE) 40 MG tablet Take 1 tablet (40 mg total) by mouth daily. Patient not taking: Reported on 10/05/2017 06/11/17   Money, Gerlene Burdock, FNP  polyethylene glycol [facility]) packet Take 17 g by mouth 2 (two) times daily. Patient not taking: Reported on 10/05/2017 04/18/17   Nicanor Alcon, April, MD    Physical Exam: Vitals:   10/05/17 1915 10/05/17 1930 10/05/17 1945 10/05/17 2000  BP: (!) 89/55 (!) 86/52 (!) 88/52 (!) 86/54  Pulse: 91 84 81 77  Resp: 20 19 17 18   Temp:      TempSrc:      SpO2: 98% 99% 99% 99%  Weight:      Height:        Constitutional: Somnolent, cachectic, no pallor, no diaphoresis  Eyes: PERTLA, lids and conjunctivae normal ENMT: Mucous membranes are moist. Posterior pharynx clear of any exudate or lesions.   Neck: normal, supple, no masses, no thyromegaly Respiratory: clear to auscultation bilaterally, no wheezing, no crackles. Normal respiratory effort.    Cardiovascular: Rate ~120 and regular. Pedal edema bilaterally. Abdomen: No distension, no tenderness, soft. Bowel sounds normal.  Musculoskeletal: no clubbing / cyanosis. No joint deformity upper and lower extremities.   Skin: no significant rashes, lesions, ulcers. Poor turgor. Neurologic: No facial asymmetry. DTR normal. Moving all extremities.  Psychiatric: Somnolent after Ativan. Rousable.     Labs on Admission: I have personally reviewed following labs and imaging studies  CBC: Recent Labs  Lab 10/05/17 1813 10/05/17 1827  WBC 11.5*  --   NEUTROABS 9.6*  --   HGB 15.6* 15.0  HCT 47.7* 44.0  MCV 91.6  --   PLT 224  --    Basic Metabolic Panel: Recent Labs  Lab 10/05/17 1813 10/05/17 1827  NA 152* 151*  K 3.1* 3.2*  CL 109  110  CO2 27  --   GLUCOSE 192* 194*  BUN 97* 94*  CREATININE 2.07* 2.20*  CALCIUM 11.7*  --   MG 2.5*  --    GFR: Estimated Creatinine Clearance: 18 mL/min (A) (by C-G formula based on SCr of 2.2 mg/dL (H)). Liver Function Tests: Recent Labs  Lab 10/05/17 1813  AST 41  ALT 26  ALKPHOS 85  BILITOT 1.1  PROT 6.7  ALBUMIN 3.7   No results for input(s): LIPASE, AMYLASE in the last 168 hours. No results for input(s): AMMONIA in the last 168 hours. Coagulation Profile: No results for input(s): INR, PROTIME in the last 168 hours. Cardiac Enzymes: No results for input(s): CKTOTAL, CKMB, CKMBINDEX, TROPONINI in the last 168 hours. BNP (last 3 results) No results for input(s): PROBNP  in the last 8760 hours. HbA1C: No results for input(s): HGBA1C in the last 72 hours. CBG: No results for input(s): GLUCAP in the last 168 hours. Lipid Profile: No results for input(s): CHOL, HDL, LDLCALC, TRIG, CHOLHDL, LDLDIRECT in the last 72 hours. Thyroid Function Tests: No results for input(s): TSH, T4TOTAL, FREET4, T3FREE, THYROIDAB in the last 72 hours. Anemia Panel: No results for input(s): VITAMINB12, FOLATE, FERRITIN, TIBC, IRON, RETICCTPCT in the last 72 hours. Urine analysis:    Component Value Date/Time   COLORURINE YELLOW 10/05/2017 1813   APPEARANCEUR CLEAR 10/05/2017 1813   LABSPEC 1.021 10/05/2017 1813   PHURINE 5.0 10/05/2017 1813   GLUCOSEU NEGATIVE 10/05/2017 1813   HGBUR NEGATIVE 10/05/2017 1813   BILIRUBINUR NEGATIVE 10/05/2017 1813   KETONESUR NEGATIVE 10/05/2017 1813   PROTEINUR NEGATIVE 10/05/2017 1813   UROBILINOGEN 0.2 12/15/2006 1654   NITRITE NEGATIVE 10/05/2017 1813   LEUKOCYTESUR NEGATIVE 10/05/2017 1813   Sepsis Labs: @LABRCNTIP (procalcitonin:4,lacticidven:4) )No results found for this or any previous visit (from the past 240 hour(s)).   Radiological Exams on Admission: Ct Head Wo Contrast  Result Date: 10/05/2017 CLINICAL DATA:  Altered level of  consciousness EXAM: CT HEAD WITHOUT CONTRAST TECHNIQUE: Contiguous axial images were obtained from the base of the skull through the vertex without intravenous contrast. COMPARISON:  None. FINDINGS: Brain: Mild age related volume loss. No acute intracranial abnormality. Specifically, no hemorrhage, hydrocephalus, mass lesion, acute infarction, or significant intracranial injury. Vascular: No hyperdense vessel or unexpected calcification. Skull: No acute calvarial abnormality. Sinuses/Orbits: Visualized paranasal sinuses and mastoids clear. Orbital soft tissues unremarkable. Other: None IMPRESSION: No acute intracranial abnormality. Electronically Signed   By: Charlett Nose M.D.   On: 10/05/2017 18:41   Dg Chest Port 1 View  Result Date: 10/05/2017 CLINICAL DATA:  Altered mental status and failure to thrive for several months. Confusion. EXAM: PORTABLE CHEST 1 VIEW COMPARISON:  08/28/2017 FINDINGS: Emphysematous hyperinflation of the lungs. Heart size with tortuous atherosclerotic aorta. No consolidation or CHF. No pleural effusion or pneumothorax. No acute fracture, malalignment nor bone destruction. IMPRESSION: Hyperinflated lungs without active pulmonary disease. Tortuous slightly atherosclerotic aorta. Electronically Signed   By: Tollie Eth M.D.   On: 10/05/2017 18:50    EKG: Independently reviewed. Sinus tachycardia (rate 135), LVH, non-specific T-wave abnormalities.   Assessment/Plan  1. Acute kidney injury  - Presents with agitation, confusion, refusing medications, and refusing to eat or drink - Found to have BUN 97 and SCr 2.07, up from 19 and 0.93 one month earlier  - Likely a prerenal azotemia in setting of recent refusal to eat or drink  - She was fluid-resuscitated in ED with 2 liter IVF  - Check urine chemistries, continue IVF hydration, renally-dose medications, avoid nephrotoxins, repeat chem panel   2. Acute encephalopathy  - Presents with agitation, confusion, refusing  medications, and refusing to eat or drink  - She has IVC paperwork at bedside  - Head CT negative for acute findings  - BUN is 97 on admission and could be contributing; psychiatric etiology also considered  - TSH is ordered from ED, add b12, folate, ammonia, and RPR - Continue supportive care    3. SIRS  - Presents with agitation, confusion, refusing medications, and refusing to eat or drink  - She is afebrile but found to have mild leukocytosis, tachycardia, and lactate of 2.49  - UA and CXR without evidence of infection, abd exam benign, no cellulitis or red/swollen joint noted, no meningismus  - Suspect this is  secondary to marked dehydration  - Cultures were collected in ED and patient was fluid-resuscitated  - Follow cultures, monitor off of antibiotics for now   4. Hypernatremia  - Serum sodium is 152 on admission in setting of anorexia with dehydration  - She was fluid-resuscitated in ED with 2 liters of isotonic IVF  - Repeat chem panel now and continue IVF hydration    5. Hypokalemia  - Serum potassium is 3.1 on admission  - Treated with 20 mEq IV potassium in ED  - Repeat chem panel and replace cautiously in light of renal failure   6. Depression with anxiety - Patient was admitted to inpatient psychiatry facility last month  - She has been living with sister at time of admission, reportedly refusing her medications recently and refusing to eat or drink  - IVC was taken out just prior to arrival in ED  - She was agitated in ED and was treated with IV Ativan  - Resume fluvoxamine, Effexor, Zyprexa, and Remeron  - Keep safety sitter at bedside for now   DVT prophylaxis: sq heparin  Code Status: Full  Family Communication: Discussed with patient  Consults called: none Admission status: Inpatient     Briscoe Deutscher, MD [facility] Pager [phone]  If 7PM-7AM, please contact night-coverage [email or web address] Password [facility]  10/05/2017, 8:52 PM

## 2017-10-05 NOTE — ED Triage Notes (Signed)
Per [facility], pt from home due to adult failure to thrive over several months. Pt is confused and constantly yelling out. Pt is being IVC'd by [employer] and family due to altered mental status. Pt lives with sister but pt will not take psych meds, is not eating or drinking and losing weight. Tachy. Disoriented.

## 2017-10-05 NOTE — ED Provider Notes (Addendum)
[facility] EMERGENCY DEPARTMENT Provider Note   CSN: [id number] Arrival date & time: 10/05/17  1747   LEVEL 5 CAVEAT - ALTERED MENTAL STATUS   History   Chief Complaint Chief Complaint  Patient presents with  . Failure To Thrive  . IVC    HPI Amanda Hall is a 67 y.o. female.  HPI  67 year old female with a history of major depressive disorder and anxiety presents with altered mental status and decreased p.o. intake.  Patient is currently being involuntarily committed by the sheriff's department.  History is taken from the nurse who spoke to EMS as well as the patient's sister over the phone.  Patient sister endorses the patient has had issues like this in the past where she will stop eating, drinking, and taking her meds.  Has been really bad over 5 days but probably progressive over a few weeks.  Similar to when she was admitted in August and had low potassium.  No fevers or cough.  The patient is currently altered and unable to provide history.  Past Medical History:  Diagnosis Date  . Allergy   . Anemia   . Anxiety   . Depression   . Hemorrhoids     Patient Active Problem List   Diagnosis Date Noted  . AKI (acute kidney injury) (HCC) 10/05/2017  . Hypernatremia 10/05/2017  . SIRS (systemic inflammatory response syndrome) (HCC) 10/05/2017  . Acute encephalopathy 10/05/2017  . Anorexia 10/05/2017  . Acute renal failure (ARF) (HCC) 10/05/2017  . Uremia   . Protein-calorie malnutrition, severe 08/29/2017  . Hypokalemia 08/28/2017  . Generalized anxiety disorder 05/26/2017  . MDD (major depressive disorder) 05/26/2017  . Hypercholesterolemia 03/22/2017  . Nonspecific (abnormal) findings on radiological and other examination of body structure 12/26/2006  . NONSPECIFIC ABNORM FIND RAD&OTH EXAM LUNG FIELD 12/26/2006  . ANEMIA-IRON DEFICIENCY 12/15/2006  . Depression 12/15/2006  . Allergic rhinitis 12/15/2006    Past Surgical History:  Procedure  Laterality Date  . arm surgery     Left  . TUBAL LIGATION       OB History   None      Home Medications    Prior to Admission medications   Medication Sig Start Date End Date Taking? Authorizing Provider  fluvoxaMINE (LUVOX) 50 MG tablet Take 3 tablets (150 mg total) by mouth at bedtime. For mood control 09/08/17  Yes Rhetta Mura, MD  Lactobacillus Rhamnosus, GG, (CULTURELLE) CAPS Take 1 capsule by mouth daily.   Yes [provider]  mirtazapine (REMERON SOL-TAB) 45 MG disintegrating tablet Take 45 mg by mouth at bedtime.   Yes [provider]  OLANZapine zydis (ZYPREXA) 10 MG disintegrating tablet Take 10 mg by mouth at bedtime.   Yes [provider]  simvastatin (ZOCOR) 40 MG tablet Take 40 mg by mouth daily.  09/15/17  Yes [provider]  tetrahydrozoline-zinc (VISINE-AC) 0.05-0.25 % ophthalmic solution Place 2 drops into both eyes 3 (three) times daily as needed (for dryness).   Yes [provider]  venlafaxine XR (EFFEXOR-XR) 37.5 MG 24 hr capsule Take 75 mg by mouth daily with breakfast.    Yes [provider]  hydrocortisone cream 1 % Apply to affected area 2 times daily Patient taking differently: Apply 1 application topically 2 (two) times daily as needed (for anal itching).  04/18/17   Palumbo, April, MD  LORazepam (ATIVAN) 1 MG tablet Take 1 tablet (1 mg total) by mouth 2 (two) times daily. Patient not taking: Reported  on 10/05/2017 09/08/17   Rhetta Mura, MD  megestrol (MEGACE) 40 MG tablet Take 1 tablet (40 mg total) by mouth daily. Patient not taking: Reported on 10/05/2017 06/11/17   Money, Gerlene Burdock, FNP  polyethylene glycol [facility]) packet Take 17 g by mouth 2 (two) times daily. Patient not taking: Reported on 10/05/2017 04/18/17   Nicanor Alcon, April, MD    Family History Family History  Problem Relation Age of Onset  . Stroke Mother   . Hypertension Sister   . Cancer Neg Hx        breat and colon hx  .  Diabetes Neg Hx        family    Social History Social History   Tobacco Use  . Smoking status: Never Smoker  . Smokeless tobacco: Never Used  Substance Use Topics  . Alcohol use: No  . Drug use: No     Allergies   Codeine; Sulfonamide derivatives; and Tetanus toxoid   Review of Systems Review of Systems  Unable to perform ROS: Mental status change     Physical Exam Updated Vital Signs BP 134/69 (BP Location: Right Arm)   Pulse 78   Temp 98.1 F (36.7 C)   Resp 14   Ht 5\' 2"  (1.575 m)   Wt 45.4 kg   SpO2 100%   BMI 18.29 kg/m   Physical Exam  Constitutional: She appears well-developed. She appears cachectic. She appears distressed.  HENT:  Head: Normocephalic and atraumatic.  Right Ear: External ear normal.  Left Ear: External ear normal.  Nose: Nose normal.  Eyes: Right eye exhibits no discharge. Left eye exhibits no discharge.  Cardiovascular: Regular rhythm and normal heart sounds. Tachycardia present.  Pulmonary/Chest: Effort normal and breath sounds normal.  Abdominal: Soft. She exhibits no distension. There is no tenderness.  Musculoskeletal: She exhibits edema (edema to bilateral feet/ankle).  Neurological: She is alert. She is disoriented.  Skin: Skin is warm and dry. She is not diaphoretic.  Psychiatric: Her mood appears anxious.  Nursing note and vitals reviewed.    ED Treatments / Results  Labs (all labs ordered are listed, but only abnormal results are displayed) Labs Reviewed  COMPREHENSIVE METABOLIC PANEL - Abnormal; Notable for the following components:      Result Value   Sodium 152 (*)    Potassium 3.1 (*)    Glucose, Bld 192 (*)    BUN 97 (*)    Creatinine, Ser 2.07 (*)    Calcium 11.7 (*)    GFR calc non Af Amer 24 (*)    GFR calc Af Amer 28 (*)    Anion gap 16 (*)    All other components within normal limits  CBC WITH DIFFERENTIAL/PLATELET - Abnormal; Notable for the following components:   WBC 11.5 (*)    RBC 5.21 (*)      Hemoglobin 15.6 (*)    HCT 47.7 (*)    Neutro Abs 9.6 (*)    All other components within normal limits  MAGNESIUM - Abnormal; Notable for the following components:   Magnesium 2.5 (*)    All other components within normal limits  BASIC METABOLIC PANEL - Abnormal; Notable for the following components:   Sodium 152 (*)    Chloride 114 (*)    Glucose, Bld 127 (*)    BUN 96 (*)    Creatinine, Ser 1.98 (*)    Calcium 10.6 (*)    GFR calc non Af Amer 25 (*)    GFR  calc Af Amer 29 (*)    All other components within normal limits  LACTIC ACID, PLASMA - Abnormal; Notable for the following components:   Lactic Acid, Venous 3.4 (*)    All other components within normal limits  I-STAT CG4 LACTIC ACID, ED - Abnormal; Notable for the following components:   Lactic Acid, Venous 2.49 (*)    All other components within normal limits  I-STAT CHEM 8, ED - Abnormal; Notable for the following components:   Sodium 151 (*)    Potassium 3.2 (*)    BUN 94 (*)    Creatinine, Ser 2.20 (*)    Glucose, Bld 194 (*)    TCO2 33 (*)    All other components within normal limits  CULTURE, BLOOD (ROUTINE X 2)  CULTURE, BLOOD (ROUTINE X 2)  URINE CULTURE  URINALYSIS, ROUTINE W REFLEX MICROSCOPIC  TSH  RAPID URINE DRUG SCREEN, HOSP PERFORMED  BASIC METABOLIC PANEL  SODIUM, URINE, RANDOM  CREATININE, URINE, RANDOM  CBC WITH DIFFERENTIAL/PLATELET  LACTIC ACID, PLASMA    EKG EKG Interpretation  Date/Time:  Wednesday October 05 2017 18:51:50 EDT Ventricular Rate:  103 PR Interval:    QRS Duration: 94 QT Interval:  368 QTC Calculation: 482 R Axis:   81 Text Interpretation:  Sinus tachycardia Right atrial enlargement Borderline right axis deviation LVH with secondary repolarization abnormality rate faster compared to Aug 2019 Confirmed by Pricilla Loveless [phone]) on 10/05/2017 6:57:27 PM   Radiology Ct Head Wo Contrast  Result Date: 10/05/2017 CLINICAL DATA:  Altered level of consciousness EXAM:  CT HEAD WITHOUT CONTRAST TECHNIQUE: Contiguous axial images were obtained from the base of the skull through the vertex without intravenous contrast. COMPARISON:  None. FINDINGS: Brain: Mild age related volume loss. No acute intracranial abnormality. Specifically, no hemorrhage, hydrocephalus, mass lesion, acute infarction, or significant intracranial injury. Vascular: No hyperdense vessel or unexpected calcification. Skull: No acute calvarial abnormality. Sinuses/Orbits: Visualized paranasal sinuses and mastoids clear. Orbital soft tissues unremarkable. Other: None IMPRESSION: No acute intracranial abnormality. Electronically Signed   By: Charlett Nose M.D.   On: 10/05/2017 18:41   Dg Chest Port 1 View  Result Date: 10/05/2017 CLINICAL DATA:  Altered mental status and failure to thrive for several months. Confusion. EXAM: PORTABLE CHEST 1 VIEW COMPARISON:  08/28/2017 FINDINGS: Emphysematous hyperinflation of the lungs. Heart size with tortuous atherosclerotic aorta. No consolidation or CHF. No pleural effusion or pneumothorax. No acute fracture, malalignment nor bone destruction. IMPRESSION: Hyperinflated lungs without active pulmonary disease. Tortuous slightly atherosclerotic aorta. Electronically Signed   By: Tollie Eth M.D.   On: 10/05/2017 18:50    Procedures .Critical Care Performed by: Pricilla Loveless, MD Authorized by: Pricilla Loveless, MD   Critical care provider statement:    Critical care time (minutes):  30   Critical care time was exclusive of:  Separately billable procedures and treating other patients   Critical care was necessary to treat or prevent imminent or life-threatening deterioration of the following conditions:  Renal failure and CNS failure or compromise   Critical care was time spent personally by me on the following activities:  Development of treatment plan with patient or surrogate, discussions with consultants, evaluation of patient's response to treatment, examination  of patient, obtaining history from patient or surrogate, ordering and performing treatments and interventions, ordering and review of laboratory studies, ordering and review of radiographic studies, pulse oximetry, re-evaluation of patient's condition and review of old charts   (including critical care time)  Medications Ordered  in ED Medications  0.9 %  sodium chloride infusion (has no administration in time range)  sodium chloride 0.9 % bolus 500 mL (500 mLs Intravenous New Bag/Given 10/05/17 2237)  megestrol (MEGACE) tablet 40 mg (has no administration in time range)  fluvoxaMINE (LUVOX) tablet 150 mg (has no administration in time range)  mirtazapine (REMERON SOL-TAB) disintegrating tablet 45 mg (has no administration in time range)  OLANZapine zydis (ZYPREXA) disintegrating tablet 10 mg (has no administration in time range)  venlafaxine XR (EFFEXOR-XR) 24 hr capsule 37.5 mg (has no administration in time range)  naphazoline-glycerin (CLEAR EYES REDNESS) ophth solution 2 drop (has no administration in time range)  simvastatin (ZOCOR) tablet 10 mg (has no administration in time range)  heparin injection 5,000 Units (has no administration in time range)  sodium chloride flush (NS) 0.9 % injection 3 mL (has no administration in time range)  acetaminophen (TYLENOL) tablet 650 mg (has no administration in time range)    Or  acetaminophen (TYLENOL) suppository 650 mg (has no administration in time range)  ondansetron (ZOFRAN) tablet 4 mg (has no administration in time range)    Or  ondansetron (ZOFRAN) injection 4 mg (has no administration in time range)  insulin aspart (novoLOG) injection 0-9 Units (has no administration in time range)  insulin aspart (novoLOG) injection 0-5 Units (has no administration in time range)  sodium chloride 0.9 % bolus 1,000 mL (0 mLs Intravenous Stopped 10/05/17 1956)  LORazepam (ATIVAN) injection 1 mg (1 mg Intravenous Given 10/05/17 1842)  lactated ringers bolus  1,000 mL (0 mLs Intravenous Stopped 10/05/17 2103)  potassium chloride 10 mEq in 100 mL IVPB (0 mEq Intravenous Stopped 10/05/17 2222)     Initial Impression / Assessment and Plan / ED Course  I have reviewed the triage vital signs and the nursing notes.  Pertinent labs & imaging results that were available during my care of the patient were reviewed by me and considered in my medical decision making (see chart for details).     Patient's primary issue appears to be renal failure with uremia, likely from poor p.o. intake from her psychiatric disease.  She has an increased lactic acid but I think this is dehydration related.  BUN is in the 90s which is likely contributing to the change in mental status.  She has been given Ativan which has resolved her tachycardia and she is now sleeping peacefully with no respiratory issues.  At this point, she appears stable for admission to the hospital service for further treatment and care.  Sister updated on plan. No obvious infection.  Final Clinical Impressions(s) / ED Diagnoses   Final diagnoses:  AKI (acute kidney injury) [facility])  Uremia    ED Discharge Orders    None       Pricilla Loveless, MD 10/05/17 8119    Pricilla Loveless, MD 10/17/17 1235

## 2017-10-05 NOTE — Progress Notes (Signed)
New Admission Note:  Arrival Method: Via stretcher from ED  Mental Orientation: Alert to voice.  Telemetry: CCMD verified  Assessment: Completed Skin: Refer to flowsheet IV: Right AC Pain: Faces 0/10 Safety Measures: Safety Fall Prevention Plan discussed with patient. Admission: Completed [facility] [place] Orientation: Patient has been orientated to the room, unit and the staff.  Orders have been reviewed and are being implemented. Will continue to monitor the patient. Call light has been placed within reach and bed alarm has been activated.   Aram Candela, RN  Phone Number: [phone]

## 2017-10-06 DIAGNOSIS — F419 Anxiety disorder, unspecified: Secondary | ICD-10-CM

## 2017-10-06 DIAGNOSIS — F332 Major depressive disorder, recurrent severe without psychotic features: Secondary | ICD-10-CM

## 2017-10-06 LAB — BASIC METABOLIC PANEL
ANION GAP: 12 (ref 5–15)
BUN: 77 mg/dL — ABNORMAL HIGH (ref 8–23)
CHLORIDE: 114 mmol/L — AB (ref 98–111)
CO2: 27 mmol/L (ref 22–32)
Calcium: 10.1 mg/dL (ref 8.9–10.3)
Creatinine, Ser: 1.6 mg/dL — ABNORMAL HIGH (ref 0.44–1.00)
GFR calc Af Amer: 38 mL/min — ABNORMAL LOW (ref >60)
GFR, EST NON AFRICAN AMERICAN: 33 mL/min — AB (ref >60)
GLUCOSE: 90 mg/dL (ref 70–99)
POTASSIUM: 2.9 mmol/L — AB (ref 3.5–5.1)
Sodium: 153 mmol/L — ABNORMAL HIGH (ref 135–145)

## 2017-10-06 LAB — CBC WITH DIFFERENTIAL/PLATELET
Abs Immature Granulocytes: 0 10*3/uL (ref 0.0–0.1)
BASOS PCT: 0 %
Basophils Absolute: 0 10*3/uL (ref 0.0–0.1)
EOS ABS: 0 10*3/uL (ref 0.0–0.7)
EOS PCT: 0 %
HEMATOCRIT: 40.9 % (ref 36.0–46.0)
Hemoglobin: 13 g/dL (ref 12.0–15.0)
Immature Granulocytes: 0 %
Lymphocytes Relative: 18 %
Lymphs Abs: 1.4 10*3/uL (ref 0.7–4.0)
MCH: 29.3 pg (ref 26.0–34.0)
MCHC: 31.8 g/dL (ref 30.0–36.0)
MCV: 92.3 fL (ref 78.0–100.0)
MONOS PCT: 8 %
Monocytes Absolute: 0.6 10*3/uL (ref 0.1–1.0)
NEUTROS PCT: 74 %
Neutro Abs: 5.7 10*3/uL (ref 1.7–7.7)
PLATELETS: 134 10*3/uL — AB (ref 150–400)
RBC: 4.43 MIL/uL (ref 3.87–5.11)
RDW: 13.7 % (ref 11.5–15.5)
WBC: 7.8 10*3/uL (ref 4.0–10.5)

## 2017-10-06 LAB — RAPID URINE DRUG SCREEN, HOSP PERFORMED
AMPHETAMINES: NOT DETECTED
BENZODIAZEPINES: NOT DETECTED
Barbiturates: NOT DETECTED
COCAINE: NOT DETECTED
OPIATES: NOT DETECTED
Tetrahydrocannabinol: NOT DETECTED

## 2017-10-06 LAB — SODIUM, URINE, RANDOM: Sodium, Ur: <10 mmol/L

## 2017-10-06 LAB — GLUCOSE, CAPILLARY
GLUCOSE-CAPILLARY: 86 mg/dL (ref 70–99)
GLUCOSE-CAPILLARY: 96 mg/dL (ref 70–99)
Glucose-Capillary: 113 mg/dL — ABNORMAL HIGH (ref 70–99)
Glucose-Capillary: 117 mg/dL — ABNORMAL HIGH (ref 70–99)

## 2017-10-06 LAB — CREATININE, URINE, RANDOM: Creatinine, Urine: 93.35 mg/dL

## 2017-10-06 LAB — LACTIC ACID, PLASMA: Lactic Acid, Venous: 3.5 mmol/L (ref 0.5–1.9)

## 2017-10-06 MED ORDER — POTASSIUM CHLORIDE 2 MEQ/ML IV SOLN
INTRAVENOUS | Status: DC
  Administered 2017-10-06: 11:00:00 via INTRAVENOUS
  Filled 2017-10-06 (×4): qty 1000

## 2017-10-06 MED ORDER — LACTATED RINGERS IV BOLUS
1000.0000 mL | Freq: Once | INTRAVENOUS | Status: AC
  Administered 2017-10-06: 1000 mL via INTRAVENOUS

## 2017-10-06 MED ORDER — POTASSIUM CHLORIDE 10 MEQ/100ML IV SOLN
10.0000 meq | INTRAVENOUS | Status: AC
  Administered 2017-10-06 (×3): 10 meq via INTRAVENOUS
  Filled 2017-10-06 (×3): qty 100

## 2017-10-06 MED ORDER — DEXTROSE-NACL 5-0.45 % IV SOLN
INTRAVENOUS | Status: DC
  Administered 2017-10-06: 07:00:00 via INTRAVENOUS

## 2017-10-06 MED ORDER — ENSURE ENLIVE PO LIQD
237.0000 mL | Freq: Three times a day (TID) | ORAL | Status: DC
  Administered 2017-10-06 – 2017-10-18 (×24): 237 mL via ORAL

## 2017-10-06 MED ORDER — HALOPERIDOL LACTATE 5 MG/ML IJ SOLN: 5.0000 mg | Freq: Once | INTRAMUSCULAR | Status: DC

## 2017-10-06 MED ORDER — ADULT MULTIVITAMIN W/MINERALS CH
1.0000 | ORAL_TABLET | Freq: Every day | ORAL | Status: DC
  Administered 2017-10-08 – 2017-10-19 (×11): 1 via ORAL
  Filled 2017-10-06 (×13): qty 1

## 2017-10-06 MED ORDER — KCL IN DEXTROSE-NACL 20-5-0.45 MEQ/L-%-% IV SOLN
INTRAVENOUS | Status: DC
  Administered 2017-10-06 – 2017-10-15 (×13): via INTRAVENOUS
  Filled 2017-10-06 (×19): qty 1000

## 2017-10-06 MED ORDER — HALOPERIDOL LACTATE 5 MG/ML IJ SOLN
5.0000 mg | Freq: Once | INTRAMUSCULAR | Status: AC
  Administered 2017-10-06: 5 mg via INTRAVENOUS
  Filled 2017-10-06: qty 1

## 2017-10-06 NOTE — Progress Notes (Addendum)
Initial Nutrition Assessment  DOCUMENTATION CODES:   Severe malnutrition in context of social or environmental circumstances, Underweight  INTERVENTION:   If aggressive nutrition intervention algins with goals of care, recommend insertion of feeding tube and initiation of enteral nutrition. Nutrition poc has been discussed with MD   Add MVI with minerals  Recommend checking magnesium and phosphorus  Ensure Enlive po TID, each supplement provides 350 kcal and 20 grams of protein  Liberalize diet to REGULAR, no restrictions  NUTRITION DIAGNOSIS:   Severe Malnutrition related to social / environmental circumstances(severe major depressive disorder, refractory depression, anxiety) as evidenced by severe fat depletion, severe muscle depletion.   GOAL:   Patient will meet greater than or equal to 90% of their needs  MONITOR:   PO intake, Supplement acceptance, Labs, Weight trends  REASON FOR ASSESSMENT:   Malnutrition Screening Tool    ASSESSMENT:   67 yo female admitted with AKI, dehydration, anorexia with acute metabolic encephalopathy, FTT. Pt has been refusing to eat, drink or take medications. Pt has severe protein calorie malnutrition. Pt with recent inpatient psych hospitalization 1 month ago. Pt reports difficulty swallowing although pt's sister believes this to be subjective. Pt with MDD, severe, with psych consult. PMH includes anxiety, depression, malnutrition  Pt lethargic on visit today after receiving IV ativan for agitation. Pt not safe for po intake due to lethargy at present; pt with 1:1 sitter. SLP consult pending  Pt seen by psych today and per MD appears to clinically depressed with psychomotor retardation and cognitive deficits. Noted pt may candidate ECT due to refractory depression Pt reports difficulty swallowing PTA. Noted sister indicating that this is subjective  Pt is at risk for refeeding, even with dextrose infusion. Hypokalemic, recommend checking  magnesium and phosphorus  Weight trend is down; current wt 41.1 kg. Weight of 49 kg in August, 54.4 kg in July, 61.2 kg in April. Current wt may be below true weight due to dehydration but pt weight loss trend is significant   Labs: sodium 153 (H), potassium 2.9 (L), Creatinine 1.6, BUN 77, CBGs wdl Meds: megace, remeron, D5-1/2 NS with KCl at 100 ml/hr  NUTRITION - FOCUSED PHYSICAL EXAM:    Most Recent Value  Orbital Region  Severe depletion  Upper Arm Region  Severe depletion  Thoracic and Lumbar Region  Severe depletion  Buccal Region  Severe depletion  Temple Region  Severe depletion  Clavicle Bone Region  Severe depletion  Clavicle and Acromion Bone Region  Severe depletion  Scapular Bone Region  Severe depletion  Dorsal Hand  Severe depletion  Patellar Region  Severe depletion  Anterior Thigh Region  Severe depletion  Posterior Calf Region  Severe depletion  Edema (RD Assessment)  Moderate       Diet Order:   Diet Order            Diet Heart Room service appropriate? Yes; Fluid consistency: Thin  Diet effective now              EDUCATION NEEDS:   Not appropriate for education at this time  Skin:  Skin Assessment: Reviewed RN Assessment  Last BM:  no documented BM  Height:   Ht Readings from Last 1 Encounters:  10/05/17 5\' 2"  (1.575 m)    Weight:   Wt Readings from Last 1 Encounters:  10/06/17 41.1 kg    Ideal Body Weight:  50 kg  BMI:  Body mass index is 16.57 kg/m.  Estimated Nutritional Needs:   Kcal:  1430-1640 kcals   Protein:  72-82 g   Fluid:  >/= 1.4 L    Romelle Starcher MS, RD, LDN, CNSC [phone] Pager  [phone] Weekend/On-Call Pager

## 2017-10-06 NOTE — Progress Notes (Signed)
CRITICAL VALUE ALERT  Critical Value: Lactic Acid-3.5  Date & Time Notied:  10/06/17  0107  Provider Notified: Donnamarie Poag  Orders Received/Actions taken: Bolus of 1L of Lactated Ringers

## 2017-10-06 NOTE — Evaluation (Signed)
Clinical/Bedside Swallow Evaluation Patient Details  Name: Amanda Hall MRN: [id number] Date of Birth: January 10, 1951  Today's Date: 10/06/2017 Time: SLP Start Time (ACUTE ONLY): 1545 SLP Stop Time (ACUTE ONLY): 1555 SLP Time Calculation (min) (ACUTE ONLY): 10 min  Past Medical History:  Past Medical History:  Diagnosis Date  . Allergy   . Anemia   . Anxiety   . Depression   . Hemorrhoids    Past Surgical History:  Past Surgical History:  Procedure Laterality Date  . arm surgery     Left  . TUBAL LIGATION     HPI:  67 year old female with depression, anxiety, recent inpatient psychiatry admission presented to ED with increasing confusion, agitation, refusal to take her medication, not eating or drinking.Patient was admitted to inpatient psychiatry approximately 1 month ago and has been living with her sister since discharge.  She has reportedly been refusing her medications, not eating or drinking, and becoming increasingly confused and agitated. Dx AKI, hypernatremia with hypokalemia, anorexia with acute metabolic encephalopathy. Evaluated by psychiatry 9/26 who recommends inpatient psych hospitalization for severe depression and anxiety.   Assessment / Plan / Recommendation Clinical Impression  Pt presents with s/s of dysphagia, pharyngeal vs esophageal.  She is confused, shouting "no," produces repetitive speech patterns, but was willing to consume limited POs.  Maintained eyes close and neck in extension throughout assessment.  Demonstrated multiple sub-swallows (up to seven) with thin liquid and pureed boluses; coughed after initial liquid bolus but no further coughing observed with ongoing consumption.  Refused further trials.  For tonight, allow full liquid diet; crush meds.  Recommend MBS next date if pt will participate.  D/W RN.  SLP Visit Diagnosis: Dysphagia, unspecified (R13.10)    Aspiration Risk       Diet Recommendation   full liquids  Medication Administration: Crushed  with puree    Other  Recommendations Oral Care Recommendations: Oral care BID   Follow up Recommendations Other (comment)(tba)      Frequency and Duration            Prognosis        Swallow Study   General Date of Onset: 10/05/17 HPI: 67 year old female with depression, anxiety, recent inpatient psychiatry admission presented to ED with increasing confusion, agitation, refusal to take her medication, not eating or drinking.Patient was admitted to inpatient psychiatry approximately 1 month ago and has been living with her sister since discharge.  She has reportedly been refusing her medications, not eating or drinking, and becoming increasingly confused and agitated. Dx AKI, hypernatremia with hypokalemia, anorexia with acute metabolic encephalopathy.  Type of Study: Bedside Swallow Evaluation Previous Swallow Assessment: no Diet Prior to this Study: Regular;Thin liquids Temperature Spikes Noted: No Respiratory Status: Room air History of Recent Intubation: No Behavior/Cognition: Alert;Confused;Uncooperative Oral Cavity Assessment: Other (comment)(uable to examine) Oral Care Completed by SLP: Other (Comment)(pt refuses) Oral Cavity - Dentition: Edentulous Self-Feeding Abilities: Refused PO Patient Positioning: Upright in bed Baseline Vocal Quality: Normal Volitional Cough: Cognitively unable to elicit Volitional Swallow: Unable to elicit    Oral/Motor/Sensory Function Overall Oral Motor/Sensory Function: Other (comment)(symmetric; adequate labial seal)   Ice Chips Ice chips: Not tested   Thin Liquid Thin Liquid: Impaired Presentation: Straw Pharyngeal  Phase Impairments: Multiple swallows;Cough - Immediate    Nectar Thick Nectar Thick Liquid: Not tested   Honey Thick Honey Thick Liquid: Not tested   Puree Puree: Impaired Presentation: Spoon Pharyngeal Phase Impairments: Multiple swallows   Solid     Solid: Not tested  Blenda Mounts Laurice 10/06/2017,4:03  PM  [name], MA CCC/SLP Acute Rehabilitation Services Office number [phone]

## 2017-10-06 NOTE — Progress Notes (Signed)
Pt having difficulty swallowing.  Pt's sisters called stated pt stated at home felt like medication and food was not going down. York Spaniel had been crushing medications to help with swallowing and still refused to eat or take medications.  Paged Dr. Isidoro Donning to see if wants swallow eval.

## 2017-10-06 NOTE — Progress Notes (Signed)
[facility]                                                                              Patient Demographics  Amanda Hall, is a 67 y.o. female, DOB - 01/03/1951, ZOX:[id number]  Admit date - 10/05/2017   Admitting Physician Briscoe Deutscher, MD  Outpatient Primary MD for the patient is [name], MD  Outpatient specialists:   LOS - 1  days   Medical records reviewed and are as summarized below:    Chief Complaint  Patient presents with  . Failure To Thrive  . IVC       Brief summary   Patient is a 67 year old female with depression, anxiety, recent inpatient psychiatry admission presented to ED with increasing confusion, agitation, refusal to take her medication, not eating or drinking.Patient was admitted to inpatient psychiatry approximately 1 month ago and has been living with her sister since discharge.  She has reportedly been refusing her medications, not eating or drinking, and becoming increasingly confused and agitated.  Patient is sedated after receiving IV Ativan in the ED due to agitation.  Patient was IVC'ed prior to arrival to ED.  Sodium 152, potassium 3.1, creatinine 2.0.  Lactic acid 2.49.  UA negative   Assessment & Plan    Principal Problem:   AKI (acute kidney injury) (HCC) -Presented with dehydration, refusing to eat or drink or medications. -Fluid resuscitated in ED with 2 L normal saline, placed on half-normal saline -Sodium trended up to 153, change to D5 half normal at 100 cc/hr with potassium supplementation  Active Problems:  Hypernatremia with hypokalemia -Change IV fluids to D5 half normal saline with potassium supplementation -Placed on KCl IV supplementation  Acute kidney injury with lactic acidosis -Patient had tachycardia, hypotension, secondary to profound dehydration.  Does not meet sepsis or Sirs criteria.  No leukocytosis or any source of infection.  Chest x-ray negative, UA negative for UTI -Continue IV  fluid hydration, creatinine trending down, -Creatinine 2.2 at the time of admission, baseline 0.9  Anorexia with acute metabolic encephalopathy, underlying major depressive disorder, recurrent severe Severe protein calorie malnutrition -Continue Megace, Remeron, fluvoxamine -Inpatient psych consult requested - continue IVC -TSH 3.5 -CT head negative for acute intracranial abnormality -Patient had esophagogram in 05/2017 which was normal   Code Status: Full CODE STATUS DVT Prophylaxis: Heparin subcu Family Communication: No family member at the bedside   Disposition Plan: Not medically ready  Time Spent in minutes   35 minutes  Procedures:  None  Consultants:   Psychiatry  Antimicrobials:      Medications  Scheduled Meds: . fluvoxaMINE  150 mg Oral QHS  . heparin  5,000 Units Subcutaneous Q8H  . insulin aspart  0-5 Units Subcutaneous QHS  . insulin aspart  0-9 Units Subcutaneous TID WC  . megestrol  40 mg Oral Daily  . mirtazapine  45 mg Oral QHS  . OLANZapine zydis  10 mg Oral QHS  . simvastatin  10 mg Oral q1800  . sodium chloride flush  3 mL Intravenous Q12H  . venlafaxine XR  37.5 mg Oral Q breakfast   Continuous Infusions: .  dextrose 5 % and 0.45% NaCl 100 mL/hr at 10/06/17 0645   PRN Meds:.acetaminophen **OR** acetaminophen, naphazoline-glycerin, ondansetron **OR** ondansetron (ZOFRAN) IV   Antibiotics   Anti-infectives (From admission, onward)   None        Subjective:   Alfa Leibensperger was seen and examined today.  Patient sedated, not following commands, unable to obtain review of system from the patient.  Sitter at the bedside.  No fevers or chills.    Objective:   Vitals:   10/05/17 2311 10/06/17 0446 10/06/17 0644 10/06/17 0748  BP: 134/69  130/65 135/66  Pulse: 78  72 73  Resp: 17  18 18   Temp: 98.1 F (36.7 C)  98.4 F (36.9 C) 98.3 F (36.8 C)  TempSrc:   Oral Oral  SpO2: 100%  100% 100%  Weight:  41.1 kg    Height:         Intake/Output Summary (Last 24 hours) at 10/06/2017 1029 Last data filed at 10/06/2017 0201 Gross per 24 hour  Intake 3852.53 ml  Output -  Net 3852.53 ml     Wt Readings from Last 3 Encounters:  10/06/17 41.1 kg  08/28/17 49 kg  07/13/17 54.4 kg     Exam  General: Sedated, NAD, frail and cachectic appearing  Eyes:   HEENT:  Atraumatic, normocephalic  Cardiovascular: S1 S2 auscultated, Regular rate and rhythm.  Respiratory: Clear to auscultation bilaterally, no wheezing, rales or rhonchi  Gastrointestinal: Soft, nontender, nondistended, + bowel sounds  Ext: no pedal edema bilaterally  Neuro: unable to assess  Musculoskeletal: No digital cyanosis, clubbing  Skin: No rashes  Psych: somnolent   Data Reviewed:  I have personally reviewed following labs and imaging studies  Micro Results Recent Results (from the past 240 hour(s))  Blood Culture (routine x 2)     Status: None (Preliminary result)   Collection Time: 10/05/17  6:43 PM  Result Value Ref Range Status   Specimen Description BLOOD RIGHT HAND  Final   Special Requests   Final    BOTTLES DRAWN AEROBIC AND ANAEROBIC Blood Culture adequate volume   Culture   Final    NO GROWTH < 12 HOURS Performed at [facility], [address], [place], [place] [address]    Report Status PENDING  Incomplete  Blood Culture (routine x 2)     Status: None (Preliminary result)   Collection Time: 10/05/17  6:47 PM  Result Value Ref Range Status   Specimen Description BLOOD LEFT ANTECUBITAL  Final   Special Requests   Final    BOTTLES DRAWN AEROBIC AND ANAEROBIC Blood Culture adequate volume   Culture   Final    NO GROWTH < 12 HOURS Performed at [facility], [address], [place], [place] [address]    Report Status PENDING  Incomplete    Radiology Reports Ct Head Wo Contrast  Result Date: 10/05/2017 CLINICAL DATA:  Altered level of consciousness EXAM: CT HEAD WITHOUT CONTRAST TECHNIQUE:  Contiguous axial images were obtained from the base of the skull through the vertex without intravenous contrast. COMPARISON:  None. FINDINGS: Brain: Mild age related volume loss. No acute intracranial abnormality. Specifically, no hemorrhage, hydrocephalus, mass lesion, acute infarction, or significant intracranial injury. Vascular: No hyperdense vessel or unexpected calcification. Skull: No acute calvarial abnormality. Sinuses/Orbits: Visualized paranasal sinuses and mastoids clear. Orbital soft tissues unremarkable. Other: None IMPRESSION: No acute intracranial abnormality. Electronically Signed   By: Charlett Nose M.D.   On: 10/05/2017 18:41  Dg Chest Port 1 View  Result Date: 10/05/2017 CLINICAL DATA:  Altered mental status and failure to thrive for several months. Confusion. EXAM: PORTABLE CHEST 1 VIEW COMPARISON:  08/28/2017 FINDINGS: Emphysematous hyperinflation of the lungs. Heart size with tortuous atherosclerotic aorta. No consolidation or CHF. No pleural effusion or pneumothorax. No acute fracture, malalignment nor bone destruction. IMPRESSION: Hyperinflated lungs without active pulmonary disease. Tortuous slightly atherosclerotic aorta. Electronically Signed   By: Tollie Eth M.D.   On: 10/05/2017 18:50    Lab Data:  CBC: Recent Labs  Lab 10/05/17 1813 10/05/17 1827 10/06/17 0808  WBC 11.5*  --  7.8  NEUTROABS 9.6*  --  5.7  HGB 15.6* 15.0 13.0  HCT 47.7* 44.0 40.9  MCV 91.6  --  92.3  PLT 224  --  134*   Basic Metabolic Panel: Recent Labs  Lab 10/05/17 1813 10/05/17 1827 10/05/17 2126 10/06/17 0808  NA 152* 151* 152* 153*  K 3.1* 3.2* 3.5 2.9*  CL 109 110 114* 114*  CO2 27  --  25 27  GLUCOSE 192* 194* 127* 90  BUN 97* 94* 96* 77*  CREATININE 2.07* 2.20* 1.98* 1.60*  CALCIUM 11.7*  --  10.6* 10.1  MG 2.5*  --   --   --    GFR: Estimated Creatinine Clearance: 22.4 mL/min (A) (by C-G formula based on SCr of 1.6 mg/dL (H)). Liver Function Tests: Recent Labs  Lab  10/05/17 1813  AST 41  ALT 26  ALKPHOS 85  BILITOT 1.1  PROT 6.7  ALBUMIN 3.7   No results for input(s): LIPASE, AMYLASE in the last 168 hours. No results for input(s): AMMONIA in the last 168 hours. Coagulation Profile: No results for input(s): INR, PROTIME in the last 168 hours. Cardiac Enzymes: No results for input(s): CKTOTAL, CKMB, CKMBINDEX, TROPONINI in the last 168 hours. BNP (last 3 results) No results for input(s): PROBNP in the last 8760 hours. HbA1C: No results for input(s): HGBA1C in the last 72 hours. CBG: Recent Labs  Lab 10/05/17 2314 10/06/17 0749  GLUCAP 84 86   Lipid Profile: No results for input(s): CHOL, HDL, LDLCALC, TRIG, CHOLHDL, LDLDIRECT in the last 72 hours. Thyroid Function Tests: Recent Labs    10/05/17 2126  TSH 3.520   Anemia Panel: No results for input(s): VITAMINB12, FOLATE, FERRITIN, TIBC, IRON, RETICCTPCT in the last 72 hours. Urine analysis:    Component Value Date/Time   COLORURINE YELLOW 10/05/2017 1813   APPEARANCEUR CLEAR 10/05/2017 1813   LABSPEC 1.021 10/05/2017 1813   PHURINE 5.0 10/05/2017 1813   GLUCOSEU NEGATIVE 10/05/2017 1813   HGBUR NEGATIVE 10/05/2017 1813   BILIRUBINUR NEGATIVE 10/05/2017 1813   KETONESUR NEGATIVE 10/05/2017 1813   PROTEINUR NEGATIVE 10/05/2017 1813   UROBILINOGEN 0.2 12/15/2006 1654   NITRITE NEGATIVE 10/05/2017 1813   LEUKOCYTESUR NEGATIVE 10/05/2017 1813     Cathryn Gallery M.D. [facility] 10/06/2017, 10:29 AM  Pager: [phone] Between 7am to 7pm - call Pager - [phone]  After 7pm go to [email or web address] - password [email or web address]  Call night coverage person covering after 7pm

## 2017-10-06 NOTE — Consult Note (Signed)
[facility] Face-to-Face Psychiatry Consult   Reason for Consult: Treatment refusal  Referring Physician: Dr. Tana Coast Patient Identification: Amanda Hall MRN:  [id number] Principal Diagnosis: MDD (major depressive disorder), recurrent severe, without psychosis ([place]) Diagnosis:   Patient Active Problem List   Diagnosis Date Noted  . AKI (acute kidney injury) ([place]) [N17.9] 10/05/2017  . Hypernatremia [E87.0] 10/05/2017  . SIRS (systemic inflammatory response syndrome) ([facility]) [R65.10] 10/05/2017  . Acute encephalopathy [G93.40] 10/05/2017  . Anorexia [R63.0] 10/05/2017  . Acute renal failure (ARF) ([place]) [N17.9] 10/05/2017  . Uremia [N19]   . Protein-calorie malnutrition, severe [E43] 08/29/2017  . Hypokalemia [E87.6] 08/28/2017  . Generalized anxiety disorder [F41.1] 05/26/2017  . MDD (major depressive disorder) [F32.9] 05/26/2017  . Hypercholesterolemia [E78.00] 03/22/2017  . Nonspecific (abnormal) findings on radiological and other examination of body structure [793] 12/26/2006  . NONSPECIFIC ABNORM FIND RAD&OTH EXAM LUNG FIELD [R93.0] 12/26/2006  . ANEMIA-IRON DEFICIENCY [D50.9] 12/15/2006  . Depression [F32.9] 12/15/2006  . Allergic rhinitis [J30.9] 12/15/2006    Total Time spent with patient: 1 hour  Subjective:   Amanda Hall is a 67 y.o. female patient admitted with AKI and dehydration.  HPI:   Per chart review, patient was admitted with AKI, dehydration, anorexia with acute metabolic encephalopathy. She has been refusing to eat or drink or take medications. She has severe protein calorie malnutrition and is currently receiving Megace and Remeron. She reports that she is unable to take swallow medications including crushed medications. She has been living with her sister since she was discharged from an inpatient psychiatric hospital 1 month ago. Home medications include Luvox 150 mg qhs, Remeron 45 mg qhs, Zyprexa 10 mg qhs and Effexor 37.5 mg daily. Of note, she was last seen by the  psychiatric consult service on 8/28 for depression and anxiety causing significant impairments in daily functioning.   On interview, Ms. Ackman reports, "It could be worst but I need to be better" when asked about her mood. She reports that she is "scared." She denies SI, HI or AVH. She states, "I guess" when asked if she has a poor appetite. She later requests grape juice to drink. She reports that she would like to go home and that staff are not treated well in the hospital. She later reports that she wants to stay in the hospital. She denies receiving ECT in the past.   Contacted patient's sister by phone. She was admitted to [facility] for one week following her last hospitalization in August at [facility]. She has not been eating or drinking or taking her medications for the past 5 days. She reports difficulty swallowing although her sister believes this is subjective. She continues to decompensate even with recent hospitalization at [facility] although her appetite briefly improved. She believes that she is declining due to significant anxiety. Ativan helped in the past for anxiety. Her sister is helping her with all of her ADLs now. She is very dependent on her sister. Her memory has declined over the past month.   Past Psychiatric History: MDD  Risk to Self:  None. Denies SI.  Risk to Others:  None. Denies HI.  Prior Inpatient Therapy:  She was admitted to [facility] for anxiety and anorexia (20-30 lb weight loss over a few weeks) in 05/2017.  Prior Outpatient Therapy:   She is followed by Dr. Leanord Hawking.  Past Medical History:  Past Medical History:  Diagnosis Date  . Allergy   . Anemia   . Anxiety   . Depression   .  Hemorrhoids     Past Surgical History:  Procedure Laterality Date  . arm surgery     Left  . TUBAL LIGATION     Family History:  Family History  Problem Relation Age of Onset  . Stroke Mother   . Hypertension Sister   . Cancer Neg Hx        breat and colon hx  .  Diabetes Neg Hx        family   Family Psychiatric  History: Grandfather-depression and committed suicide. Social History:  Social History   Substance and Sexual Activity  Alcohol Use No     Social History   Substance and Sexual Activity  Drug Use No    Social History   Socioeconomic History  . Marital status: Divorced    Spouse name: Not on file  . Number of children: Not on file  . Years of education: Not on file  . Highest education level: Not on file  Occupational History  . Not on file  Social Needs  . Financial resource strain: Not on file  . Food insecurity:    Worry: Not on file    Inability: Not on file  . Transportation needs:    Medical: Not on file    Non-medical: Not on file  Tobacco Use  . Smoking status: Never Smoker  . Smokeless tobacco: Never Used  Substance and Sexual Activity  . Alcohol use: No  . Drug use: No  . Sexual activity: Not Currently  Lifestyle  . Physical activity:    Days per week: Not on file    Minutes per session: Not on file  . Stress: Not on file  Relationships  . Social connections:    Talks on phone: Not on file    Gets together: Not on file    Attends religious service: Not on file    Active member of club or organization: Not on file    Attends meetings of clubs or organizations: Not on file    Relationship status: Not on file  Other Topics Concern  . Not on file  Social History Narrative  . Not on file   Additional Social History: She lives at home with her sister.     Allergies:   Allergies  Allergen Reactions  . Codeine Other (See Comments)    "Makes my head feel crazy"  . Sulfonamide Derivatives Nausea Only  . Tetanus Toxoid Other (See Comments)    "Made a Knot" (at site)    Labs:  Results for orders placed or performed during the hospital encounter of 10/05/17 (from the past 48 hour(s))  Comprehensive metabolic panel     Status: Abnormal   Collection Time: 10/05/17  6:13 PM  Result Value Ref Range    Sodium 152 (H) 135 - 145 mmol/L   Potassium 3.1 (L) 3.5 - 5.1 mmol/L   Chloride 109 98 - 111 mmol/L   CO2 27 22 - 32 mmol/L   Glucose, Bld 192 (H) 70 - 99 mg/dL   BUN 97 (H) 8 - 23 mg/dL   Creatinine, Ser 2.07 (H) 0.44 - 1.00 mg/dL   Calcium 11.7 (H) 8.9 - 10.3 mg/dL   Total Protein 6.7 6.5 - 8.1 g/dL   Albumin 3.7 3.5 - 5.0 g/dL   AST 41 15 - 41 U/L   ALT 26 0 - 44 U/L   Alkaline Phosphatase 85 38 - 126 U/L   Total Bilirubin 1.1 0.3 - 1.2 mg/dL   GFR  calc non Af Amer 24 (L) >60 mL/min   GFR calc Af Amer 28 (L) >60 mL/min    Comment: (NOTE) The eGFR has been calculated using the CKD EPI equation. This calculation has not been validated in all clinical situations. eGFR's persistently <60 mL/min signify possible Chronic Kidney Disease.    Anion gap 16 (H) 5 - 15    Comment: Performed at [facility], [place] [address] [place], [place] [address]  CBC WITH DIFFERENTIAL     Status: Abnormal   Collection Time: 10/05/17  6:13 PM  Result Value Ref Range   WBC 11.5 (H) 4.0 - 10.5 K/uL   RBC 5.21 (H) 3.87 - 5.11 MIL/uL   Hemoglobin 15.6 (H) 12.0 - 15.0 g/dL   HCT 47.7 (H) 36.0 - 46.0 %   MCV 91.6 78.0 - 100.0 fL   MCH 29.9 26.0 - 34.0 pg   MCHC 32.7 30.0 - 36.0 g/dL   RDW 13.5 11.5 - 15.5 %   Platelets 224 150 - 400 K/uL   Neutrophils Relative % 84 %   Neutro Abs 9.6 (H) 1.7 - 7.7 K/uL   Lymphocytes Relative 12 %   Lymphs Abs 1.4 0.7 - 4.0 K/uL   Monocytes Relative 4 %   Monocytes Absolute 0.5 0.1 - 1.0 K/uL   Eosinophils Relative 0 %   Eosinophils Absolute 0.0 0.0 - 0.7 K/uL   Basophils Relative 0 %   Basophils Absolute 0.0 0.0 - 0.1 K/uL   Immature Granulocytes 0 %   Abs Immature Granulocytes 0.0 0.0 - 0.1 K/uL    Comment: Performed at [facility], [place] [address], [place], [place] [address]  Urinalysis, Routine w reflex microscopic     Status: None   Collection Time: 10/05/17  6:13 PM  Result Value Ref Range   Color, Urine YELLOW YELLOW   APPearance CLEAR  CLEAR   Specific Gravity, Urine 1.021 1.005 - 1.030   pH 5.0 5.0 - 8.0   Glucose, UA NEGATIVE NEGATIVE mg/dL   Hgb urine dipstick NEGATIVE NEGATIVE   Bilirubin Urine NEGATIVE NEGATIVE   Ketones, ur NEGATIVE NEGATIVE mg/dL   Protein, ur NEGATIVE NEGATIVE mg/dL   Nitrite NEGATIVE NEGATIVE   Leukocytes, UA NEGATIVE NEGATIVE    Comment: Performed at [facility] [address] [place], [place] [address]  Urine rapid drug screen (hosp performed)     Status: None   Collection Time: 10/05/17  6:13 PM  Result Value Ref Range   Opiates NONE DETECTED NONE DETECTED   Cocaine NONE DETECTED NONE DETECTED   Benzodiazepines NONE DETECTED NONE DETECTED   Amphetamines NONE DETECTED NONE DETECTED   Tetrahydrocannabinol NONE DETECTED NONE DETECTED   Barbiturates NONE DETECTED NONE DETECTED    Comment: (NOTE) DRUG SCREEN FOR MEDICAL PURPOSES ONLY.  IF CONFIRMATION IS NEEDED FOR ANY PURPOSE, NOTIFY LAB WITHIN 5 DAYS. LOWEST DETECTABLE LIMITS FOR URINE DRUG SCREEN Drug Class                     Cutoff (ng/mL) Amphetamine and metabolites    1000 Barbiturate and metabolites    200 Benzodiazepine                 607 Tricyclics and metabolites     300 Opiates and metabolites        300 Cocaine and metabolites        300 THC  50 Performed at [facility], [place] [address], [place], [place] [address]   Magnesium     Status: Abnormal   Collection Time: 10/05/17  6:13 PM  Result Value Ref Range   Magnesium 2.5 (H) 1.7 - 2.4 mg/dL    Comment: Performed at [facility] [address] [place], [place] [address]  I-Stat CG4 Lactic Acid, ED  (not at  [facility])     Status: Abnormal   Collection Time: 10/05/17  6:27 PM  Result Value Ref Range   Lactic Acid, Venous 2.49 (HH) 0.5 - 1.9 mmol/L   Comment NOTIFIED PHYSICIAN   I-stat chem 8, ed     Status: Abnormal   Collection Time: 10/05/17  6:27 PM  Result Value Ref Range   Sodium 151 (H) 135 - 145 mmol/L    Potassium 3.2 (L) 3.5 - 5.1 mmol/L   Chloride 110 98 - 111 mmol/L   BUN 94 (H) 8 - 23 mg/dL   Creatinine, Ser 2.20 (H) 0.44 - 1.00 mg/dL   Glucose, Bld 194 (H) 70 - 99 mg/dL   Calcium, Ion 1.34 1.15 - 1.40 mmol/L   TCO2 33 (H) 22 - 32 mmol/L   Hemoglobin 15.0 12.0 - 15.0 g/dL   HCT 44.0 36.0 - 46.0 %  Blood Culture (routine x 2)     Status: None (Preliminary result)   Collection Time: 10/05/17  6:43 PM  Result Value Ref Range   Specimen Description BLOOD RIGHT HAND    Special Requests      BOTTLES DRAWN AEROBIC AND ANAEROBIC Blood Culture adequate volume   Culture      NO GROWTH < 12 HOURS Performed at [facility], [address] [place], [place] [address]    Report Status PENDING   Blood Culture (routine x 2)     Status: None (Preliminary result)   Collection Time: 10/05/17  6:47 PM  Result Value Ref Range   Specimen Description BLOOD LEFT ANTECUBITAL    Special Requests      BOTTLES DRAWN AEROBIC AND ANAEROBIC Blood Culture adequate volume   Culture      NO GROWTH < 12 HOURS Performed at [facility], [place] [address], [place], [place] [address]    Report Status PENDING   Sodium, urine, random     Status: None   Collection Time: 10/05/17  8:15 PM  Result Value Ref Range   Sodium, Ur <10 mmol/L    Comment: Performed at [facility] [address], [place], [place] [address]  Creatinine, urine, random     Status: None   Collection Time: 10/05/17  8:15 PM  Result Value Ref Range   Creatinine, Urine 93.35 mg/dL    Comment: Performed at [facility] [address] [place], [place] [address]  TSH     Status: None   Collection Time: 10/05/17  9:26 PM  Result Value Ref Range   TSH 3.520 0.350 - 4.500 uIU/mL    Comment: Performed by a 3rd Generation assay with a functional sensitivity of <=0.01 uIU/mL. Performed at [facility], [place] [address] [place], [place] [address]   Basic metabolic panel     Status: Abnormal   Collection Time: 10/05/17   9:26 PM  Result Value Ref Range   Sodium 152 (H) 135 - 145 mmol/L   Potassium 3.5 3.5 - 5.1 mmol/L   Chloride 114 (H) 98 - 111 mmol/L   CO2  25 22 - 32 mmol/L   Glucose, Bld 127 (H) 70 - 99 mg/dL   BUN 96 (H) 8 - 23 mg/dL   Creatinine, Ser 1.98 (H) 0.44 - 1.00 mg/dL   Calcium 10.6 (H) 8.9 - 10.3 mg/dL   GFR calc non Af Amer 25 (L) >60 mL/min   GFR calc Af Amer 29 (L) >60 mL/min    Comment: (NOTE) The eGFR has been calculated using the CKD EPI equation. This calculation has not been validated in all clinical situations. eGFR's persistently <60 mL/min signify possible Chronic Kidney Disease.    Anion gap 13 5 - 15    Comment: Performed at [facility] [address] [place], [place] [address]  Lactic acid, plasma     Status: Abnormal   Collection Time: 10/05/17  9:26 PM  Result Value Ref Range   Lactic Acid, Venous 3.4 (HH) 0.5 - 1.9 mmol/L    Comment: CRITICAL RESULT CALLED TO, READ BACK BY AND VERIFIED WITH: Willaim Bane 10/05/17 2206 WAYK Performed at [facility], [place] [address] [place], [place] [address]   Glucose, capillary     Status: None   Collection Time: 10/05/17 11:14 PM  Result Value Ref Range   Glucose-Capillary 84 70 - 99 mg/dL  Lactic acid, plasma     Status: Abnormal   Collection Time: 10/06/17 12:13 AM  Result Value Ref Range   Lactic Acid, Venous 3.5 (HH) 0.5 - 1.9 mmol/L    Comment: CRITICAL RESULT CALLED TO, READ BACK BY AND VERIFIED WITH: WOODY J,RN 10/06/17 0105 WAYK Performed at [facility], [place] [address], [place], [place] [address]   Glucose, capillary     Status: None   Collection Time: 10/06/17  7:49 AM  Result Value Ref Range   Glucose-Capillary 86 70 - 99 mg/dL  Basic metabolic panel     Status: Abnormal   Collection Time: 10/06/17  8:08 AM  Result Value Ref Range   Sodium 153 (H) 135 - 145 mmol/L   Potassium 2.9 (L) 3.5 - 5.1 mmol/L   Chloride 114 (H) 98 - 111 mmol/L   CO2 27 22 - 32 mmol/L   Glucose, Bld 90 70 - 99  mg/dL   BUN 77 (H) 8 - 23 mg/dL   Creatinine, Ser 1.60 (H) 0.44 - 1.00 mg/dL   Calcium 10.1 8.9 - 10.3 mg/dL   GFR calc non Af Amer 33 (L) >60 mL/min   GFR calc Af Amer 38 (L) >60 mL/min    Comment: (NOTE) The eGFR has been calculated using the CKD EPI equation. This calculation has not been validated in all clinical situations. eGFR's persistently <60 mL/min signify possible Chronic Kidney Disease.    Anion gap 12 5 - 15    Comment: Performed at [facility] [address], [place], [place] [address]  CBC WITH DIFFERENTIAL     Status: Abnormal   Collection Time: 10/06/17  8:08 AM  Result Value Ref Range   WBC 7.8 4.0 - 10.5 K/uL   RBC 4.43 3.87 - 5.11 MIL/uL   Hemoglobin 13.0 12.0 - 15.0 g/dL   HCT 40.9 36.0 - 46.0 %   MCV 92.3 78.0 - 100.0 fL   MCH 29.3 26.0 - 34.0 pg   MCHC 31.8 30.0 - 36.0 g/dL   RDW 13.7 11.5 - 15.5 %   Platelets 134 (L) 150 - 400 K/uL   Neutrophils Relative % 74 %   Neutro Abs 5.7  1.7 - 7.7 K/uL   Lymphocytes Relative 18 %   Lymphs Abs 1.4 0.7 - 4.0 K/uL   Monocytes Relative 8 %   Monocytes Absolute 0.6 0.1 - 1.0 K/uL   Eosinophils Relative 0 %   Eosinophils Absolute 0.0 0.0 - 0.7 K/uL   Basophils Relative 0 %   Basophils Absolute 0.0 0.0 - 0.1 K/uL   Immature Granulocytes 0 %   Abs Immature Granulocytes 0.0 0.0 - 0.1 K/uL    Comment: Performed at [facility] [address], [place], [place] [address]  Glucose, capillary     Status: None   Collection Time: 10/06/17 11:24 AM  Result Value Ref Range   Glucose-Capillary 96 70 - 99 mg/dL    Current Facility-Administered Medications  Medication Dose Route Frequency Provider Last Rate Last Dose  . acetaminophen (TYLENOL) tablet 650 mg  650 mg Oral Q6H PRN Opyd, Ilene Qua, MD       Or  . acetaminophen (TYLENOL) suppository 650 mg  650 mg Rectal Q6H PRN Opyd, Timothy S, MD      . dextrose 5 % and 0.45 % NaCl with KCl 20 mEq/L infusion   Intravenous Continuous Hammons, Theone Murdoch, RPH       . fluvoxaMINE (LUVOX) tablet 150 mg  150 mg Oral QHS Opyd, Ilene Qua, MD      . heparin injection 5,000 Units  5,000 Units Subcutaneous Q8H Opyd, Ilene Qua, MD   5,000 Units at 10/06/17 0556  . insulin aspart (novoLOG) injection 0-5 Units  0-5 Units Subcutaneous QHS Opyd, Timothy S, MD      . insulin aspart (novoLOG) injection 0-9 Units  0-9 Units Subcutaneous TID WC Opyd, Ilene Qua, MD      . megestrol (MEGACE) tablet 40 mg  40 mg Oral Daily Opyd, Ilene Qua, MD      . mirtazapine (REMERON SOL-TAB) disintegrating tablet 45 mg  45 mg Oral QHS Opyd, Ilene Qua, MD      . naphazoline-glycerin (CLEAR EYES REDNESS) ophth solution 2 drop  2 drop Both Eyes QID PRN Opyd, Ilene Qua, MD      . OLANZapine zydis (ZYPREXA) disintegrating tablet 10 mg  10 mg Oral QHS Opyd, Ilene Qua, MD      . ondansetron (ZOFRAN) tablet 4 mg  4 mg Oral Q6H PRN Opyd, Ilene Qua, MD       Or  . ondansetron (ZOFRAN) injection 4 mg  4 mg Intravenous Q6H PRN Opyd, Ilene Qua, MD      . potassium chloride 10 mEq in 100 mL IVPB  10 mEq Intravenous Q1 Hr x 3 Rai, Ripudeep K, MD 100 mL/hr at 10/06/17 1220 10 mEq at 10/06/17 1220  . simvastatin (ZOCOR) tablet 10 mg  10 mg Oral q1800 Opyd, Ilene Qua, MD      . sodium chloride flush (NS) 0.9 % injection 3 mL  3 mL Intravenous Q12H Opyd, Ilene Qua, MD      . venlafaxine XR (EFFEXOR-XR) 24 hr capsule 37.5 mg  37.5 mg Oral Q breakfast Opyd, Ilene Qua, MD        Musculoskeletal: Strength & Muscle Tone: Generalized weakness Gait & Station: UTA since lying in bed. Patient leans: N/A  Psychiatric Specialty Exam: Physical Exam  Nursing note and vitals reviewed. Constitutional: She is oriented to person, place, and time. She appears well-developed.  thin  HENT:  Head: Normocephalic and atraumatic.  Neck: Normal range of motion.  Respiratory: Effort normal.  Musculoskeletal: Normal  range of motion.  Neurological: She is alert and oriented to person, place, and time.  Psychiatric:  Judgment and thought content normal. Her mood appears anxious. Her speech is delayed. She is slowed and withdrawn. Cognition and memory are impaired. She exhibits a depressed mood.    Review of Systems  Gastrointestinal: Negative for constipation, diarrhea, nausea and vomiting.  Psychiatric/Behavioral: Positive for depression. Negative for hallucinations, substance abuse and suicidal ideas. The patient is nervous/anxious.   All other systems reviewed and are negative.   Blood pressure 135/66, pulse 73, temperature 98.3 F (36.8 C), temperature source Oral, resp. rate 18, height 5' 2"  (1.575 m), weight 41.1 kg, SpO2 100 %.Body mass index is 16.57 kg/m.  General Appearance: Fairly Groomed, thin, elderly, Caucasian female, wearing a hospital gown and lying in bed. NAD.   Eye Contact:  None  Speech:  Slow  Volume:  Decreased  Mood:  I could be worst."  Affect:  Blunt  Thought Process:  Linear and Descriptions of Associations: Intact  Orientation:  Other:  Oriented to person only.  Thought Content:  Logical  Suicidal Thoughts:  No  Homicidal Thoughts:  No  Memory:  Immediate;   Poor Recent;   Fair Remote;   Fair  Judgement:  Impaired  Insight:  Shallow  Psychomotor Activity:  Psychomotor Retardation  Concentration:  Concentration: Fair and Attention Span: Fair  Recall:  Poor  Fund of Knowledge:  Fair  Language:  Fair  Akathisia:  No  Handed:  Right  AIMS (if indicated):   N/A  Assets:  Desire for Improvement Housing Social Support  ADL's:  Impaired  Cognition:  Impaired due to altered mental status.   Sleep:   N/A   Assessment:  Amanda Hall is a 67 y.o. female who was admitted with AKI secondary to poor PO intake. She appears clinically depressed with psychomotor retardation and cognitive deficits. She endorses depressive and anxiety symptoms with poor appetite and severe malnutrition. She warrants inpatient psychiatric hospitalization for stabilization and treatment for ongoing  severe depression. She may be a candidate for ECT due to refractory depression.   Treatment Plan Summary: -Patient warrants inpatient psychiatric hospitalization for severe depression and anxiety causing significant impairments in daily functioning with poor PO intake and severe malnutrition. -Continue bedside sitter.  -Continue home medications: Luvox 150 mg qhs for depression and anxiety, Remeron 45 mg qhs for depression and anxiety, Zyprexa 10 mg qhs for anxiety and mood stabilization and Effexor 37.5 mg daily for depression and anxiety. -Patient may be a good candidate for ECT given refractory severe depression/anxiety.  -Please pursue involuntary commitment if patient refuses voluntary psychiatric hospitalization or attempts to leave the hospital.  -EKG reviewed and QTc 482 on 9/25. Please closely monitor when starting or increasing QTc prolonging agents.  -Will sign off on patient at this time. Please consult psychiatry again as needed.     Disposition: Recommend psychiatric Inpatient admission when medically cleared.  Faythe Dingwall, DO 10/06/2017 1:43 PM

## 2017-10-07 LAB — BASIC METABOLIC PANEL
Anion gap: 8 (ref 5–15)
BUN: 42 mg/dL — AB (ref 8–23)
CHLORIDE: 110 mmol/L (ref 98–111)
CO2: 29 mmol/L (ref 22–32)
CREATININE: 1.04 mg/dL — AB (ref 0.44–1.00)
Calcium: 9.6 mg/dL (ref 8.9–10.3)
GFR calc Af Amer: >60 mL/min (ref >60)
GFR calc non Af Amer: 55 mL/min — ABNORMAL LOW (ref >60)
GLUCOSE: 143 mg/dL — AB (ref 70–99)
POTASSIUM: 2.9 mmol/L — AB (ref 3.5–5.1)
Sodium: 147 mmol/L — ABNORMAL HIGH (ref 135–145)

## 2017-10-07 LAB — MAGNESIUM: Magnesium: 1.7 mg/dL (ref 1.7–2.4)

## 2017-10-07 LAB — GLUCOSE, CAPILLARY
GLUCOSE-CAPILLARY: 146 mg/dL — AB (ref 70–99)
Glucose-Capillary: 127 mg/dL — ABNORMAL HIGH (ref 70–99)
Glucose-Capillary: 143 mg/dL — ABNORMAL HIGH (ref 70–99)
Glucose-Capillary: 141 mg/dL — ABNORMAL HIGH (ref 70–99)

## 2017-10-07 LAB — PHOSPHORUS: Phosphorus: 1.3 mg/dL — ABNORMAL LOW (ref 2.5–4.6)

## 2017-10-07 MED ORDER — MEGESTROL ACETATE 400 MG/10ML PO SUSP
400.0000 mg | Freq: Every day | ORAL | Status: DC
  Administered 2017-10-07 – 2017-10-23 (×17): 400 mg via ORAL
  Filled 2017-10-07 (×17): qty 10

## 2017-10-07 MED ORDER — POTASSIUM PHOSPHATES 15 MMOLE/5ML IV SOLN
30.0000 mmol | Freq: Once | INTRAVENOUS | Status: AC
  Administered 2017-10-07: 30 mmol via INTRAVENOUS
  Filled 2017-10-07: qty 10

## 2017-10-07 MED ORDER — POTASSIUM CHLORIDE 10 MEQ/100ML IV SOLN: 10.0000 meq | INTRAVENOUS | Status: DC

## 2017-10-07 MED ORDER — K PHOS MONO-SOD PHOS DI & MONO 155-852-130 MG PO TABS: 500.0000 mg | ORAL_TABLET | Freq: Two times a day (BID) | ORAL | Status: DC

## 2017-10-07 NOTE — Progress Notes (Signed)
  Speech Language Pathology Treatment: Dysphagia  Patient Details Name: Amanda Hall MRN: [id number] DOB: 1950-07-04 Today's Date: 10/07/2017 Time: 9562-1308 SLP Time Calculation (min) (ACUTE ONLY): 35 min  Assessment / Plan / Recommendation Clinical Impression  Pt seen for follow up after BSE completed 10/06/17. Per nursing, pt has refused po intake, but appeared to tolerate meds crushed in puree. Upon arrival of SLP, pt was sleeping, open mouth breathing. Oral cavity was significantly dry. Oral care was completed with suction, during which pt repeatedly called out "no", and "it's not going to work". Following oral care, pt was given ice chips, which she accepted. Oral prep and transit appeared timely, with adequate laryngeal elevation per palpation. No overt s/s aspiration elicited following trials of ice chips. Pt refused presentations of water and pudding. At this time, completion of instrumental assessment (MBS) would likely not reveal significant insight, as refusal of barium-impregnated trials is anticipated. Will continue recommendation for full liquid diet, and adherence to posted safe swallow precautions. Pt repeatedly verbalizes her desire to go home, but continues to refuse po intake. Explanation and reasoning with pt is ineffective at this point. ST will continue to follow for education and diet tolerance.    HPI HPI: 67 year old female with depression, anxiety, recent inpatient psychiatry admission presented to ED with increasing confusion, agitation, refusal to take her medication, not eating or drinking.Patient was admitted to inpatient psychiatry approximately 1 month ago and has been living with her sister since discharge.  She has reportedly been refusing her medications, not eating or drinking, and becoming increasingly confused and agitated. Dx AKI, hypernatremia with hypokalemia, anorexia with acute metabolic encephalopathy.       SLP Plan  Continue with current plan of care        Recommendations  Diet recommendations: (full liquid) Medication Administration: Crushed with puree Supervision: Full supervision/cueing for compensatory strategies Postural Changes and/or Swallow Maneuvers: Seated upright 90 degrees                Oral Care Recommendations: Oral care QID Follow up Recommendations: (TBD) SLP Visit Diagnosis: Dysphagia, unspecified (R13.10) Plan: Continue with current plan of care       GO               Aayana Reinertsen B. Murvin Natal [facility], CCC-SLP Speech Language Pathologist [phone]  Leigh [place] 10/07/2017, 9:43 AM

## 2017-10-07 NOTE — Progress Notes (Signed)
[facility]                                                                              Patient Demographics  Amanda Hall, is a 67 y.o. female, DOB - [birth year], ZOX:[id number]  Admit date - 10/05/2017   Admitting Physician Briscoe Deutscher, MD  Outpatient Primary MD for the patient is [name], MD  Outpatient specialists:   LOS - 2  days   Medical records reviewed and are as summarized below:    Chief Complaint  Patient presents with  . Failure To Thrive  . IVC       Brief summary   Patient is a 67 year old female with depression, anxiety, recent inpatient psychiatry admission presented to ED with increasing confusion, agitation, refusal to take her medication, not eating or drinking.Patient was admitted to inpatient psychiatry approximately 1 month ago and has been living with her sister since discharge.  She has reportedly been refusing her medications, not eating or drinking, and becoming increasingly confused and agitated.  Patient is sedated after receiving IV Ativan in the ED due to agitation.  Patient was IVC'ed prior to arrival to ED.  Sodium 152, potassium 3.1, creatinine 2.0.  Lactic acid 2.49.  UA negative   Assessment & Plan    Principal Problem: Acute kidney injury with lactic acidosis -Patient had tachycardia, hypotension, secondary to profound dehydration.  Does not meet sepsis or Sirs criteria.  No leukocytosis or any source of infection.  Chest x-ray negative, UA negative for UTI -Creatinine improving, 1.0 today from 2.2 on admission.   - Continue IV fluid hydration  Active Problems:  Hypernatremia -Change IV fluids to D5 half normal saline with potassium supplementation -Sodium improving, 147 today from 153  Hypokalemia with hypophosphatemia -Continue IV fluids with potassium supplementation, placed on K-Phos 30 mL IV x1 - will start on oral K-Phos tomorrow  Anorexia with acute metabolic encephalopathy, underlying major  depressive disorder, recurrent severe Severe protein calorie malnutrition -Continue Megace, Remeron, fluvoxamine -Appreciate psych recommendations, needs inpatient behavioral facility - continue IVC -TSH 3.5 -CT head negative for acute intracranial abnormality -Patient had esophagogram in 05/2017 which was normal -SLP evaluation recommended full liquid diet   Code Status: Full CODE STATUS DVT Prophylaxis: Heparin subcu Family Communication: No family member at the bedside   Disposition Plan: Not medically ready  Time Spent in minutes   35 minutes  Procedures:  None  Consultants:   Psychiatry  Antimicrobials:      Medications  Scheduled Meds: . feeding supplement (ENSURE ENLIVE)  237 mL Oral TID BM  . fluvoxaMINE  150 mg Oral QHS  . heparin  5,000 Units Subcutaneous Q8H  . insulin aspart  0-5 Units Subcutaneous QHS  . insulin aspart  0-9 Units Subcutaneous TID WC  . megestrol  400 mg Oral Daily  . mirtazapine  45 mg Oral QHS  . multivitamin with minerals  1 tablet Oral Daily  . OLANZapine zydis  10 mg Oral QHS  . simvastatin  10 mg Oral q1800  . sodium chloride flush  3 mL Intravenous Q12H  . venlafaxine XR  37.5 mg Oral  Q breakfast   Continuous Infusions: . dextrose 5 % and 0.45 % NaCl with KCl 20 mEq/L 100 mL/hr at 10/07/17 1051   PRN Meds:.acetaminophen **OR** acetaminophen, naphazoline-glycerin, ondansetron **OR** ondansetron (ZOFRAN) IV   Antibiotics   Anti-infectives (From admission, onward)   None        Subjective:   [name] was seen and examined today.  Alert and awake, tearful, oriented to self.  Sitter at the bedside.  Poor oral intake, no fevers or chills.    Objective:   Vitals:   10/06/17 2151 10/07/17 0446 10/07/17 0620 10/07/17 0739  BP: 100/74  114/76 135/75  Pulse: 85  85 86  Resp: 20  18 18   Temp: (!) 97.5 F (36.4 C)     TempSrc: Oral     SpO2: 100%  100% 99%  Weight: 41.1 kg 41.1 kg    Height:        Intake/Output  Summary (Last 24 hours) at 10/07/2017 1217 Last data filed at 10/07/2017 1059 Gross per 24 hour  Intake 2064.94 ml  Output 0 ml  Net 2064.94 ml     Wt Readings from Last 3 Encounters:  10/07/17 41.1 kg  08/28/17 49 kg  07/13/17 54.4 kg     Exam   General: Alert and awake, oriented to self, depressed and tearful, cachectic  Eyes:   HEENT:    Cardiovascular: S1 S2 auscultated, RRR. No pedal edema b/l  Respiratory: Clear to auscultation bilaterally, no wheezing, rales or rhonchi  Gastrointestinal: Soft, nontender, nondistended, + bowel sounds  Ext: no pedal edema bilaterally  Neuro: no new FND  Musculoskeletal: No digital cyanosis, clubbing  Skin: No rashes  Psych: anxious, tearful   Data Reviewed:  I have personally reviewed following labs and imaging studies  Micro Results Recent Results (from the past 240 hour(s))  Blood Culture (routine x 2)     Status: None (Preliminary result)   Collection Time: 10/05/17  6:43 PM  Result Value Ref Range Status   Specimen Description BLOOD RIGHT HAND  Final   Special Requests   Final    BOTTLES DRAWN AEROBIC AND ANAEROBIC Blood Culture adequate volume   Culture   Final    NO GROWTH 2 DAYS Performed at [facility], [address] [place], [place] [address]    Report Status PENDING  Incomplete  Blood Culture (routine x 2)     Status: None (Preliminary result)   Collection Time: 10/05/17  6:47 PM  Result Value Ref Range Status   Specimen Description BLOOD LEFT ANTECUBITAL  Final   Special Requests   Final    BOTTLES DRAWN AEROBIC AND ANAEROBIC Blood Culture adequate volume   Culture   Final    NO GROWTH 2 DAYS Performed at [facility], [address] [place], [place] [address]    Report Status PENDING  Incomplete  Urine culture     Status: Abnormal (Preliminary result)   Collection Time: 10/05/17  6:57 PM  Result Value Ref Range Status   Specimen Description URINE, CATHETERIZED  Final   Special  Requests NONE  Final   Culture (A)  Final    >=100,000 COLONIES/mL UNIDENTIFIED ORGANISM Performed at [facility], [address], [place], [place] [address]    Report Status PENDING  Incomplete    Radiology Reports Ct Head Wo Contrast  Result Date: 10/05/2017 CLINICAL DATA:  Altered level of consciousness EXAM: CT HEAD WITHOUT CONTRAST TECHNIQUE: Contiguous axial images were  obtained from the base of the skull through the vertex without intravenous contrast. COMPARISON:  None. FINDINGS: Brain: Mild age related volume loss. No acute intracranial abnormality. Specifically, no hemorrhage, hydrocephalus, mass lesion, acute infarction, or significant intracranial injury. Vascular: No hyperdense vessel or unexpected calcification. Skull: No acute calvarial abnormality. Sinuses/Orbits: Visualized paranasal sinuses and mastoids clear. Orbital soft tissues unremarkable. Other: None IMPRESSION: No acute intracranial abnormality. Electronically Signed   By: Charlett Nose M.D.   On: 10/05/2017 18:41   Dg Chest Port 1 View  Result Date: 10/05/2017 CLINICAL DATA:  Altered mental status and failure to thrive for several months. Confusion. EXAM: PORTABLE CHEST 1 VIEW COMPARISON:  08/28/2017 FINDINGS: Emphysematous hyperinflation of the lungs. Heart size with tortuous atherosclerotic aorta. No consolidation or CHF. No pleural effusion or pneumothorax. No acute fracture, malalignment nor bone destruction. IMPRESSION: Hyperinflated lungs without active pulmonary disease. Tortuous slightly atherosclerotic aorta. Electronically Signed   By: Tollie Eth M.D.   On: 10/05/2017 18:50    Lab Data:  CBC: Recent Labs  Lab 10/05/17 1813 10/05/17 1827 10/06/17 0808  WBC 11.5*  --  7.8  NEUTROABS 9.6*  --  5.7  HGB 15.6* 15.0 13.0  HCT 47.7* 44.0 40.9  MCV 91.6  --  92.3  PLT 224  --  134*   Basic Metabolic Panel: Recent Labs  Lab 10/05/17 1813 10/05/17 1827 10/05/17 2126 10/06/17 0808 10/07/17 0722    NA 152* 151* 152* 153* 147*  K 3.1* 3.2* 3.5 2.9* 2.9*  CL 109 110 114* 114* 110  CO2 27  --  25 27 29   GLUCOSE 192* 194* 127* 90 143*  BUN 97* 94* 96* 77* 42*  CREATININE 2.07* 2.20* 1.98* 1.60* 1.04*  CALCIUM 11.7*  --  10.6* 10.1 9.6  MG 2.5*  --   --   --  1.7  PHOS  --   --   --   --  1.3*   GFR: Estimated Creatinine Clearance: 34.5 mL/min (A) (by C-G formula based on SCr of 1.04 mg/dL (H)). Liver Function Tests: Recent Labs  Lab 10/05/17 1813  AST 41  ALT 26  ALKPHOS 85  BILITOT 1.1  PROT 6.7  ALBUMIN 3.7   No results for input(s): LIPASE, AMYLASE in the last 168 hours. No results for input(s): AMMONIA in the last 168 hours. Coagulation Profile: No results for input(s): INR, PROTIME in the last 168 hours. Cardiac Enzymes: No results for input(s): CKTOTAL, CKMB, CKMBINDEX, TROPONINI in the last 168 hours. BNP (last 3 results) No results for input(s): PROBNP in the last 8760 hours. HbA1C: No results for input(s): HGBA1C in the last 72 hours. CBG: Recent Labs  Lab 10/06/17 1124 10/06/17 1649 10/06/17 2154 10/07/17 0736 10/07/17 1129  GLUCAP 96 113* 117* 127* 146*   Lipid Profile: No results for input(s): CHOL, HDL, LDLCALC, TRIG, CHOLHDL, LDLDIRECT in the last 72 hours. Thyroid Function Tests: Recent Labs    10/05/17 2126  TSH 3.520   Anemia Panel: No results for input(s): VITAMINB12, FOLATE, FERRITIN, TIBC, IRON, RETICCTPCT in the last 72 hours. Urine analysis:    Component Value Date/Time   COLORURINE YELLOW 10/05/2017 1813   APPEARANCEUR CLEAR 10/05/2017 1813   LABSPEC 1.021 10/05/2017 1813   PHURINE 5.0 10/05/2017 1813   GLUCOSEU NEGATIVE 10/05/2017 1813   HGBUR NEGATIVE 10/05/2017 1813   BILIRUBINUR NEGATIVE 10/05/2017 1813   KETONESUR NEGATIVE 10/05/2017 1813   PROTEINUR NEGATIVE 10/05/2017 1813   UROBILINOGEN 0.2 12/15/2006 1654   NITRITE NEGATIVE  10/05/2017 1813   LEUKOCYTESUR NEGATIVE 10/05/2017 1813     Beau Ramsburg M.D. [facility] 10/07/2017, 12:17 PM  Pager: [phone] Between 7am to 7pm - call Pager - [phone]  After 7pm go to [email or web address] - password [email or web address]  Call night coverage person covering after 7pm

## 2017-10-07 NOTE — Progress Notes (Signed)
Patient refusing to take anything by mouth at this time. Unable to give morning medications. Patient stating "no, no, no." Will continue to monitor. Bess Kinds, RN

## 2017-10-07 NOTE — Care Management Important Message (Signed)
Important Message  Patient Details  Name: Amanda Hall MRN: [id number] Date of Birth: [birth year]   Medicare Important Message Given:  No  Not able to sign due to illness /Unsigned copy left   Lysbeth Dicola Stefan Church 10/07/2017, 4:42 PM

## 2017-10-08 LAB — BASIC METABOLIC PANEL
Anion gap: 8 (ref 5–15)
BUN: 22 mg/dL (ref 8–23)
CO2: 28 mmol/L (ref 22–32)
CREATININE: 0.94 mg/dL (ref 0.44–1.00)
Calcium: 9.6 mg/dL (ref 8.9–10.3)
Chloride: 108 mmol/L (ref 98–111)
GFR calc Af Amer: >60 mL/min (ref >60)
Glucose, Bld: 127 mg/dL — ABNORMAL HIGH (ref 70–99)
Potassium: 3.1 mmol/L — ABNORMAL LOW (ref 3.5–5.1)
SODIUM: 144 mmol/L (ref 135–145)

## 2017-10-08 LAB — URINE CULTURE: Culture: >=100000 — AB

## 2017-10-08 LAB — GLUCOSE, CAPILLARY
GLUCOSE-CAPILLARY: 117 mg/dL — AB (ref 70–99)
GLUCOSE-CAPILLARY: 115 mg/dL — AB (ref 70–99)
Glucose-Capillary: 132 mg/dL — ABNORMAL HIGH (ref 70–99)
Glucose-Capillary: 110 mg/dL — ABNORMAL HIGH (ref 70–99)

## 2017-10-08 MED ORDER — POTASSIUM CHLORIDE CRYS ER 20 MEQ PO TBCR
40.0000 meq | EXTENDED_RELEASE_TABLET | Freq: Once | ORAL | Status: AC
  Administered 2017-10-08: 40 meq via ORAL
  Filled 2017-10-08: qty 2

## 2017-10-08 NOTE — Progress Notes (Signed)
[facility]                                                                              Patient Demographics  Amanda Hall, is a 67 y.o. female, DOB - 1950-01-13, HYQ:[id number]  Admit date - 10/05/2017   Admitting Physician Briscoe Deutscher, MD  Outpatient Primary MD for the patient is [name], MD  Outpatient specialists:   LOS - 3  days   Medical records reviewed and are as summarized below:    Chief Complaint  Patient presents with  . Failure To Thrive  . IVC       Brief summary   Patient is a 67 year old female with depression, anxiety, recent inpatient psychiatry admission presented to ED with increasing confusion, agitation, refusal to take her medication, not eating or drinking.Patient was admitted to inpatient psychiatry approximately 1 month ago and has been living with her sister since discharge.  She has reportedly been refusing her medications, not eating or drinking, and becoming increasingly confused and agitated.  Patient is sedated after receiving IV Ativan in the ED due to agitation.  Patient was IVC'ed prior to arrival to ED.  Sodium 152, potassium 3.1, creatinine 2.0.  Lactic acid 2.49.  UA negative   Assessment & Plan    Principal Problem: Acute kidney injury with lactic acidosis -Patient had tachycardia, hypotension, secondary to profound dehydration.  Does not meet sepsis or Sirs criteria.  No leukocytosis or any source of infection.  Chest x-ray negative, UA negative for UTI -Creatinine normalized, 2.2 on admission. -Continue IV fluid hydration, poor oral intake.  Active Problems:  Hypernatremia -Sodium 152 at the time of admission.   -Continue D5 half-normal saline with potassium supplementation until Na less than 140 -Sodium improving, 144  Hypokalemia with hypophosphatemia -Continue IV fluids with potassium supplementation, K-Dur x1 today  Anorexia with acute metabolic encephalopathy, underlying major depressive  disorder, recurrent severe Severe protein calorie malnutrition -Continue Megace, Remeron, fluvoxamine -Appreciate psych recommendations, needs inpatient behavioral facility - continue IVC -TSH 3.5 -CT head negative for acute intracranial abnormality -Patient had esophagogram in 05/2017 which was normal -SLP evaluation recommended full liquid diet   Code Status: Full CODE STATUS DVT Prophylaxis: Heparin subcu Family Communication: No family member at the bedside   Disposition Plan: Needs to stay inpatient until inpatient behavioral health/psych facility available  Time Spent in minutes   25 minutes  Procedures:  None  Consultants:   Psychiatry  Antimicrobials:      Medications  Scheduled Meds: . feeding supplement (ENSURE ENLIVE)  237 mL Oral TID BM  . fluvoxaMINE  150 mg Oral QHS  . heparin  5,000 Units Subcutaneous Q8H  . insulin aspart  0-5 Units Subcutaneous QHS  . insulin aspart  0-9 Units Subcutaneous TID WC  . megestrol  400 mg Oral Daily  . mirtazapine  45 mg Oral QHS  . multivitamin with minerals  1 tablet Oral Daily  . OLANZapine zydis  10 mg Oral QHS  . simvastatin  10 mg Oral q1800  . sodium chloride flush  3 mL Intravenous Q12H  . venlafaxine XR  37.5 mg Oral  Q breakfast   Continuous Infusions: . dextrose 5 % and 0.45 % NaCl with KCl 20 mEq/L 100 mL/hr at 10/08/17 1217   PRN Meds:.acetaminophen **OR** acetaminophen, naphazoline-glycerin, ondansetron **OR** ondansetron (ZOFRAN) IV   Antibiotics   Anti-infectives (From admission, onward)   None        Subjective:   Amanda Hall was seen and examined today.  Oriented to self, difficult to obtain review of system from the patient, no meaningful conversation.  No fevers or chills. Objective:   Vitals:   10/07/17 1636 10/07/17 2004 10/08/17 0502 10/08/17 0828  BP: 129/67 128/61 116/60 126/72  Pulse: (!) 104 (!) 104 94 99  Resp:  18 20 20   Temp:  98 F (36.7 C) 98.3 F (36.8 C)   TempSrc:   Oral Oral   SpO2:  96% 98% 98%  Weight:  42 kg 41.6 kg   Height:        Intake/Output Summary (Last 24 hours) at 10/08/2017 1242 Last data filed at 10/08/2017 1219 Gross per 24 hour  Intake 1491.63 ml  Output 900 ml  Net 591.63 ml     Wt Readings from Last 3 Encounters:  10/08/17 41.6 kg  08/28/17 49 kg  07/13/17 54.4 kg     Exam    General: Alert and awake, oriented to self, NAD, confused, frail and cachectic  Eyes:  HEENT:  Atraumatic, normocephalic  Cardiovascular: S1 S2 auscultated,. Regular rate and rhythm. No pedal edema b/l  Respiratory: Clear to auscultation bilaterally, no wheezing, rales or rhonchi  Gastrointestinal: Soft, nontender, nondistended, + bowel sounds  Ext: no pedal edema bilaterally  Neuro: no new deficits  Musculoskeletal: No digital cyanosis, clubbing  Skin: No rashes  Psych: confused   Data Reviewed:  I have personally reviewed following labs and imaging studies  Micro Results Recent Results (from the past 240 hour(s))  Blood Culture (routine x 2)     Status: None (Preliminary result)   Collection Time: 10/05/17  6:43 PM  Result Value Ref Range Status   Specimen Description BLOOD RIGHT HAND  Final   Special Requests   Final    BOTTLES DRAWN AEROBIC AND ANAEROBIC Blood Culture adequate volume   Culture   Final    NO GROWTH 3 DAYS Performed at [facility], [address], [place], [place] [address]    Report Status PENDING  Incomplete  Blood Culture (routine x 2)     Status: None (Preliminary result)   Collection Time: 10/05/17  6:47 PM  Result Value Ref Range Status   Specimen Description BLOOD LEFT ANTECUBITAL  Final   Special Requests   Final    BOTTLES DRAWN AEROBIC AND ANAEROBIC Blood Culture adequate volume   Culture   Final    NO GROWTH 3 DAYS Performed at [facility], [address], [place], [place] [address]    Report Status PENDING  Incomplete  Urine culture     Status: Abnormal   Collection  Time: 10/05/17  6:57 PM  Result Value Ref Range Status   Specimen Description URINE, CATHETERIZED  Final   Special Requests NONE  Final   Culture (A)  Final    >=100,000 COLONIES/mL AEROCOCCUS SPECIES Standardized susceptibility testing for this organism is not available. Performed at [facility], [address], [place], [place] [address]    Report Status 10/08/2017 FINAL  Final    Radiology Reports Ct Head Wo Contrast  Result Date: 10/05/2017 CLINICAL DATA:  Altered  level of consciousness EXAM: CT HEAD WITHOUT CONTRAST TECHNIQUE: Contiguous axial images were obtained from the base of the skull through the vertex without intravenous contrast. COMPARISON:  None. FINDINGS: Brain: Mild age related volume loss. No acute intracranial abnormality. Specifically, no hemorrhage, hydrocephalus, mass lesion, acute infarction, or significant intracranial injury. Vascular: No hyperdense vessel or unexpected calcification. Skull: No acute calvarial abnormality. Sinuses/Orbits: Visualized paranasal sinuses and mastoids clear. Orbital soft tissues unremarkable. Other: None IMPRESSION: No acute intracranial abnormality. Electronically Signed   By: Charlett Nose M.D.   On: 10/05/2017 18:41   Dg Chest Port 1 View  Result Date: 10/05/2017 CLINICAL DATA:  Altered mental status and failure to thrive for several months. Confusion. EXAM: PORTABLE CHEST 1 VIEW COMPARISON:  08/28/2017 FINDINGS: Emphysematous hyperinflation of the lungs. Heart size with tortuous atherosclerotic aorta. No consolidation or CHF. No pleural effusion or pneumothorax. No acute fracture, malalignment nor bone destruction. IMPRESSION: Hyperinflated lungs without active pulmonary disease. Tortuous slightly atherosclerotic aorta. Electronically Signed   By: Tollie Eth M.D.   On: 10/05/2017 18:50    Lab Data:  CBC: Recent Labs  Lab 10/05/17 1813 10/05/17 1827 10/06/17 0808  WBC 11.5*  --  7.8  NEUTROABS 9.6*  --  5.7  HGB 15.6*  15.0 13.0  HCT 47.7* 44.0 40.9  MCV 91.6  --  92.3  PLT 224  --  134*   Basic Metabolic Panel: Recent Labs  Lab 10/05/17 1813 10/05/17 1827 10/05/17 2126 10/06/17 0808 10/07/17 0722 10/08/17 0416  NA 152* 151* 152* 153* 147* 144  K 3.1* 3.2* 3.5 2.9* 2.9* 3.1*  CL 109 110 114* 114* 110 108  CO2 27  --  25 27 29 28   GLUCOSE 192* 194* 127* 90 143* 127*  BUN 97* 94* 96* 77* 42* 22  CREATININE 2.07* 2.20* 1.98* 1.60* 1.04* 0.94  CALCIUM 11.7*  --  10.6* 10.1 9.6 9.6  MG 2.5*  --   --   --  1.7  --   PHOS  --   --   --   --  1.3*  --    GFR: Estimated Creatinine Clearance: 38.7 mL/min (by C-G formula based on SCr of 0.94 mg/dL). Liver Function Tests: Recent Labs  Lab 10/05/17 1813  AST 41  ALT 26  ALKPHOS 85  BILITOT 1.1  PROT 6.7  ALBUMIN 3.7   No results for input(s): LIPASE, AMYLASE in the last 168 hours. No results for input(s): AMMONIA in the last 168 hours. Coagulation Profile: No results for input(s): INR, PROTIME in the last 168 hours. Cardiac Enzymes: No results for input(s): CKTOTAL, CKMB, CKMBINDEX, TROPONINI in the last 168 hours. BNP (last 3 results) No results for input(s): PROBNP in the last 8760 hours. HbA1C: No results for input(s): HGBA1C in the last 72 hours. CBG: Recent Labs  Lab 10/07/17 1129 10/07/17 1634 10/07/17 2003 10/08/17 0849 10/08/17 1151  GLUCAP 146* 143* 141* 117* 132*   Lipid Profile: No results for input(s): CHOL, HDL, LDLCALC, TRIG, CHOLHDL, LDLDIRECT in the last 72 hours. Thyroid Function Tests: Recent Labs    10/05/17 2126  TSH 3.520   Anemia Panel: No results for input(s): VITAMINB12, FOLATE, FERRITIN, TIBC, IRON, RETICCTPCT in the last 72 hours. Urine analysis:    Component Value Date/Time   COLORURINE YELLOW 10/05/2017 1813   APPEARANCEUR CLEAR 10/05/2017 1813   LABSPEC 1.021 10/05/2017 1813   PHURINE 5.0 10/05/2017 1813   GLUCOSEU NEGATIVE 10/05/2017 1813   HGBUR NEGATIVE 10/05/2017 1813  BILIRUBINUR  NEGATIVE 10/05/2017 1813   KETONESUR NEGATIVE 10/05/2017 1813   PROTEINUR NEGATIVE 10/05/2017 1813   UROBILINOGEN 0.2 12/15/2006 1654   NITRITE NEGATIVE 10/05/2017 1813   LEUKOCYTESUR NEGATIVE 10/05/2017 1813     Ripudeep Rai M.D. [facility] 10/08/2017, 12:42 PM  Pager: [phone] Between 7am to 7pm - call Pager - [phone]  After 7pm go to [email or web address] - password [email or web address]  Call night coverage person covering after 7pm

## 2017-10-09 LAB — GLUCOSE, CAPILLARY
GLUCOSE-CAPILLARY: 78 mg/dL (ref 70–99)
GLUCOSE-CAPILLARY: 230 mg/dL — AB (ref 70–99)
Glucose-Capillary: 138 mg/dL — ABNORMAL HIGH (ref 70–99)
Glucose-Capillary: 94 mg/dL (ref 70–99)

## 2017-10-09 LAB — BASIC METABOLIC PANEL
Anion gap: 7 (ref 5–15)
BUN: 15 mg/dL (ref 8–23)
CHLORIDE: 111 mmol/L (ref 98–111)
CO2: 25 mmol/L (ref 22–32)
Calcium: 9.7 mg/dL (ref 8.9–10.3)
Creatinine, Ser: 0.93 mg/dL (ref 0.44–1.00)
GFR calc non Af Amer: >60 mL/min (ref >60)
Glucose, Bld: 95 mg/dL (ref 70–99)
POTASSIUM: 3.7 mmol/L (ref 3.5–5.1)
SODIUM: 143 mmol/L (ref 135–145)

## 2017-10-09 NOTE — Progress Notes (Signed)
  Speech Language Pathology Treatment: Dysphagia  Patient Details Name: Amanda Hall MRN: [id number] DOB: Nov 24, 1950 Today's Date: 10/09/2017 Time: 6962-9528 SLP Time Calculation (min) (ACUTE ONLY): 18 min  Assessment / Plan / Recommendation Clinical Impression  SLP followed up with pt for dysphagia to determine readiness for instrumental testing or diet advancement. Sitter reports pt has been tolerating pudding, Ensure. Pt told SLP she has trouble swallowing but "can't explain it." Pt declining PO trials initially but with encouragement accepted small bites of ice cream, straw sips of Ensure, several bites of pudding and a small amount of applesauce. Other than appearing to piecemeal larger sips of liquids, she does not have multiple swallows per bite, and there were no overt signs of aspiration. Some oral holding however swallow initiation appears timely. Vocal quality remains clear. Pt seemed anxious, particularly when offered solid cracker, grits, or further bites of applesauce. This SLP suspects the aversion is based on texture, as she did very well with smoother textured foods. Pt asked periodically, "How can I know if it's going right?" and seemed anxious about her swallowing. Per chart review she had a normal barium esophagram in May; there are no remarks in documentation re: pharyngeal function. I question whether her dysphagia may be attributable to conversion/ functional neurologic symptom disorder. I discussed MBS with pt for further evaluation although she is unsure if she would like to participate at this time. I recommend advancing to Dys 1 (puree), thin liquids; assist pt with choosing foods with a smoother, uniform texture such as pudding, yogurt, ice cream. Will follow up next date to determine if MBS appropriate.      HPI HPI: 67 year old female with depression, anxiety, recent inpatient psychiatry admission presented to ED with increasing confusion, agitation, refusal to take her  medication, not eating or drinking.Patient was admitted to inpatient psychiatry approximately 1 month ago and has been living with her sister since discharge.  She has reportedly been refusing her medications, not eating or drinking, and becoming increasingly confused and agitated. Dx AKI, hypernatremia with hypokalemia, anorexia with acute metabolic encephalopathy.       SLP Plan  Continue with current plan of care       Recommendations  Diet recommendations: Dysphagia 1 (puree);Thin liquid Liquids provided via: Teaspoon;Cup;Straw Medication Administration: Crushed with puree Supervision: Full supervision/cueing for compensatory strategies Compensations: Slow rate;Small sips/bites Postural Changes and/or Swallow Maneuvers: Seated upright 90 degrees                Oral Care Recommendations: Oral care BID Follow up Recommendations: Other (comment)(tba) SLP Visit Diagnosis: Dysphagia, unspecified (R13.10) Plan: Continue with current plan of care       GO               Amanda Baton, MS, CCC-SLP Speech-Language Pathologist Acute Rehabilitation Services Pager: [phone] Office: [phone]  Amanda Hall 10/09/2017, 12:41 PM

## 2017-10-09 NOTE — Progress Notes (Signed)
[facility]                                                                              Patient Demographics  Amanda Hall, is a 67 y.o. female, DOB - 1950/08/16, [id number]  Admit date - 10/05/2017   Admitting Physician Briscoe Deutscher, MD  Outpatient Primary MD for the patient is [name], MD  Outpatient specialists:   LOS - 4  days   Medical records reviewed and are as summarized below:    Chief Complaint  Patient presents with  . Failure To Thrive  . IVC       Brief summary   Patient is a 67 year old female with depression, anxiety, recent inpatient psychiatry admission presented to ED with increasing confusion, agitation, refusal to take her medication, not eating or drinking.Patient was admitted to inpatient psychiatry approximately 1 month ago and has been living with her sister since discharge.  She has reportedly been refusing her medications, not eating or drinking, and becoming increasingly confused and agitated.  Patient is sedated after receiving IV Ativan in the ED due to agitation.  Patient was IVC'ed prior to arrival to ED.  Sodium 152, potassium 3.1, creatinine 2.0.  Lactic acid 2.49.  UA negative   Assessment & Plan    Principal Problem: Acute kidney injury with lactic acidosis -Patient had tachycardia, hypotension, secondary to profound dehydration.  Does not meet sepsis or Sirs criteria.  No leukocytosis or any source of infection.  Chest x-ray negative, UA negative for UTI -Currently on gentle IV fluid hydration, creatinine 2.2 on admission, 0.93 today  Active Problems:  Hypernatremia -Sodium 152 at the time of admission.   -Continue D5 half-normal saline with potassium supplementation until Na less than 140 -Sodium improving, 143, decrease IV fluids to 50 cc an hour  Hypokalemia with hypophosphatemia -Potassium 3.7 -Recheck phosphorus in a.m.  Anorexia with acute metabolic encephalopathy, underlying major depressive  disorder, recurrent severe Severe protein calorie malnutrition -Continue Megace, Remeron, fluvoxamine -Appreciate psych recommendations, needs inpatient behavioral facility - continue IVC -TSH 3.5 -CT head negative for acute intracranial abnormality -Patient had esophagogram in 05/2017 which was normal -Appreciate speech evaluation, upgraded diet to dysphagia 1   Code Status: Full CODE STATUS DVT Prophylaxis: Heparin subcu Family Communication: No family member at the bedside   Disposition Plan: Needs to stay inpatient until inpatient behavioral health/psych facility available  Time Spent in minutes   25 minutes  Procedures:  None  Consultants:   Psychiatry  Antimicrobials:      Medications  Scheduled Meds: . feeding supplement (ENSURE ENLIVE)  237 mL Oral TID BM  . fluvoxaMINE  150 mg Oral QHS  . heparin  5,000 Units Subcutaneous Q8H  . insulin aspart  0-5 Units Subcutaneous QHS  . insulin aspart  0-9 Units Subcutaneous TID WC  . megestrol  400 mg Oral Daily  . mirtazapine  45 mg Oral QHS  . multivitamin with minerals  1 tablet Oral Daily  . OLANZapine zydis  10 mg Oral QHS  . simvastatin  10 mg Oral q1800  . sodium chloride flush  3 mL Intravenous Q12H  .  venlafaxine XR  37.5 mg Oral Q breakfast   Continuous Infusions: . dextrose 5 % and 0.45 % NaCl with KCl 20 mEq/L 100 mL/hr at 10/09/17 1200   PRN Meds:.acetaminophen **OR** acetaminophen, naphazoline-glycerin, ondansetron **OR** ondansetron (ZOFRAN) IV   Antibiotics   Anti-infectives (From admission, onward)   None        Subjective:   Amanda Hall was seen and examined today.  Much more alert and awake, oriented to self.  No fevers or chills.  Eating with tech in the room.  Unable to obtain review of system from the patient..  Objective:   Vitals:   10/08/17 1800 10/08/17 2116 10/09/17 0527 10/09/17 0912  BP: 133/75 125/78 129/82 137/78  Pulse: 100 (!) 101 (!) 120 (!) 117  Resp: 18 (!) 24 (!)  24 16  Temp: 97.8 F (36.6 C) 98 F (36.7 C) 98.4 F (36.9 C) 98.3 F (36.8 C)  TempSrc: Axillary  Oral Oral  SpO2: 100% 98% 97% 97%  Weight:      Height:        Intake/Output Summary (Last 24 hours) at 10/09/2017 1348 Last data filed at 10/09/2017 1340 Gross per 24 hour  Intake 990.98 ml  Output 1150 ml  Net -159.02 ml     Wt Readings from Last 3 Encounters:  10/08/17 41.6 kg  08/28/17 49 kg  07/13/17 54.4 kg     Exam   General: Alert and oriented x self, frail and cachectic Eyes:  HEENT:  Atraumatic, normocephalic Cardiovascular: S1 S2 auscultated, Regular rate and rhythm. No pedal edema b/l Respiratory: Clear to auscultation bilaterally, no wheezing, rales or rhonchi Gastrointestinal: Soft, nontender, nondistended, + bowel sounds Ext: no pedal edema bilaterally Neuro: does not cooperate Musculoskeletal: No digital cyanosis, clubbing Skin: No rashes  Psych: flat affect   Data Reviewed:  I have personally reviewed following labs and imaging studies  Micro Results Recent Results (from the past 240 hour(s))  Blood Culture (routine x 2)     Status: None (Preliminary result)   Collection Time: 10/05/17  6:43 PM  Result Value Ref Range Status   Specimen Description BLOOD RIGHT HAND  Final   Special Requests   Final    BOTTLES DRAWN AEROBIC AND ANAEROBIC Blood Culture adequate volume   Culture   Final    NO GROWTH 4 DAYS Performed at [facility], [address], [place], [place] [address]    Report Status PENDING  Incomplete  Blood Culture (routine x 2)     Status: None (Preliminary result)   Collection Time: 10/05/17  6:47 PM  Result Value Ref Range Status   Specimen Description BLOOD LEFT ANTECUBITAL  Final   Special Requests   Final    BOTTLES DRAWN AEROBIC AND ANAEROBIC Blood Culture adequate volume   Culture   Final    NO GROWTH 4 DAYS Performed at [facility], [address], [place], [place] [address]    Report Status PENDING   Incomplete  Urine culture     Status: Abnormal   Collection Time: 10/05/17  6:57 PM  Result Value Ref Range Status   Specimen Description URINE, CATHETERIZED  Final   Special Requests NONE  Final   Culture (A)  Final    >=100,000 COLONIES/mL AEROCOCCUS SPECIES Standardized susceptibility testing for this organism is not available. Performed at [facility], [address], [place], [place] [address]    Report Status 10/08/2017 FINAL  Final    Radiology  Reports Ct Head Wo Contrast  Result Date: 10/05/2017 CLINICAL DATA:  Altered level of consciousness EXAM: CT HEAD WITHOUT CONTRAST TECHNIQUE: Contiguous axial images were obtained from the base of the skull through the vertex without intravenous contrast. COMPARISON:  None. FINDINGS: Brain: Mild age related volume loss. No acute intracranial abnormality. Specifically, no hemorrhage, hydrocephalus, mass lesion, acute infarction, or significant intracranial injury. Vascular: No hyperdense vessel or unexpected calcification. Skull: No acute calvarial abnormality. Sinuses/Orbits: Visualized paranasal sinuses and mastoids clear. Orbital soft tissues unremarkable. Other: None IMPRESSION: No acute intracranial abnormality. Electronically Signed   By: Charlett Nose M.D.   On: 10/05/2017 18:41   Dg Chest Port 1 View  Result Date: 10/05/2017 CLINICAL DATA:  Altered mental status and failure to thrive for several months. Confusion. EXAM: PORTABLE CHEST 1 VIEW COMPARISON:  08/28/2017 FINDINGS: Emphysematous hyperinflation of the lungs. Heart size with tortuous atherosclerotic aorta. No consolidation or CHF. No pleural effusion or pneumothorax. No acute fracture, malalignment nor bone destruction. IMPRESSION: Hyperinflated lungs without active pulmonary disease. Tortuous slightly atherosclerotic aorta. Electronically Signed   By: Tollie Eth M.D.   On: 10/05/2017 18:50    Lab Data:  CBC: Recent Labs  Lab 10/05/17 1813 10/05/17 1827 10/06/17 0808   WBC 11.5*  --  7.8  NEUTROABS 9.6*  --  5.7  HGB 15.6* 15.0 13.0  HCT 47.7* 44.0 40.9  MCV 91.6  --  92.3  PLT 224  --  134*   Basic Metabolic Panel: Recent Labs  Lab 10/05/17 1813  10/05/17 2126 10/06/17 0808 10/07/17 0722 10/08/17 0416 10/09/17 0413  NA 152*   < > 152* 153* 147* 144 143  K 3.1*   < > 3.5 2.9* 2.9* 3.1* 3.7  CL 109   < > 114* 114* 110 108 111  CO2 27  --  25 27 29 28 25   GLUCOSE 192*   < > 127* 90 143* 127* 95  BUN 97*   < > 96* 77* 42* 22 15  CREATININE 2.07*   < > 1.98* 1.60* 1.04* 0.94 0.93  CALCIUM 11.7*  --  10.6* 10.1 9.6 9.6 9.7  MG 2.5*  --   --   --  1.7  --   --   PHOS  --   --   --   --  1.3*  --   --    < > = values in this interval not displayed.   GFR: Estimated Creatinine Clearance: 39.1 mL/min (by C-G formula based on SCr of 0.93 mg/dL). Liver Function Tests: Recent Labs  Lab 10/05/17 1813  AST 41  ALT 26  ALKPHOS 85  BILITOT 1.1  PROT 6.7  ALBUMIN 3.7   No results for input(s): LIPASE, AMYLASE in the last 168 hours. No results for input(s): AMMONIA in the last 168 hours. Coagulation Profile: No results for input(s): INR, PROTIME in the last 168 hours. Cardiac Enzymes: No results for input(s): CKTOTAL, CKMB, CKMBINDEX, TROPONINI in the last 168 hours. BNP (last 3 results) No results for input(s): PROBNP in the last 8760 hours. HbA1C: No results for input(s): HGBA1C in the last 72 hours. CBG: Recent Labs  Lab 10/08/17 1151 10/08/17 1640 10/08/17 2119 10/09/17 0820 10/09/17 1212  GLUCAP 132* 110* 115* 78 230*   Lipid Profile: No results for input(s): CHOL, HDL, LDLCALC, TRIG, CHOLHDL, LDLDIRECT in the last 72 hours. Thyroid Function Tests: No results for input(s): TSH, T4TOTAL, FREET4, T3FREE, THYROIDAB in the last 72 hours. Anemia Panel: No results  for input(s): VITAMINB12, FOLATE, FERRITIN, TIBC, IRON, RETICCTPCT in the last 72 hours. Urine analysis:    Component Value Date/Time   COLORURINE YELLOW 10/05/2017  1813   APPEARANCEUR CLEAR 10/05/2017 1813   LABSPEC 1.021 10/05/2017 1813   PHURINE 5.0 10/05/2017 1813   GLUCOSEU NEGATIVE 10/05/2017 1813   HGBUR NEGATIVE 10/05/2017 1813   BILIRUBINUR NEGATIVE 10/05/2017 1813   KETONESUR NEGATIVE 10/05/2017 1813   PROTEINUR NEGATIVE 10/05/2017 1813   UROBILINOGEN 0.2 12/15/2006 1654   NITRITE NEGATIVE 10/05/2017 1813   LEUKOCYTESUR NEGATIVE 10/05/2017 1813     Amanda Hall M.D. [facility] 10/09/2017, 1:48 PM  Pager: [phone] Between 7am to 7pm - call Pager - [phone]  After 7pm go to [email or web address] - password [email or web address]  Call night coverage person covering after 7pm

## 2017-10-10 LAB — BASIC METABOLIC PANEL
ANION GAP: 3 — AB (ref 5–15)
BUN: 19 mg/dL (ref 8–23)
CHLORIDE: 112 mmol/L — AB (ref 98–111)
CO2: 27 mmol/L (ref 22–32)
Calcium: 9.6 mg/dL (ref 8.9–10.3)
Creatinine, Ser: 0.95 mg/dL (ref 0.44–1.00)
GFR calc non Af Amer: >60 mL/min (ref >60)
GLUCOSE: 113 mg/dL — AB (ref 70–99)
Potassium: 3.8 mmol/L (ref 3.5–5.1)
Sodium: 142 mmol/L (ref 135–145)

## 2017-10-10 LAB — CULTURE, BLOOD (ROUTINE X 2)
CULTURE: NO GROWTH
Culture: NO GROWTH
Special Requests: ADEQUATE
Special Requests: ADEQUATE

## 2017-10-10 LAB — PHOSPHORUS: PHOSPHORUS: 1.7 mg/dL — AB (ref 2.5–4.6)

## 2017-10-10 LAB — GLUCOSE, CAPILLARY
GLUCOSE-CAPILLARY: 109 mg/dL — AB (ref 70–99)
GLUCOSE-CAPILLARY: 126 mg/dL — AB (ref 70–99)
GLUCOSE-CAPILLARY: 165 mg/dL — AB (ref 70–99)
Glucose-Capillary: 119 mg/dL — ABNORMAL HIGH (ref 70–99)

## 2017-10-10 LAB — MAGNESIUM: Magnesium: 1.5 mg/dL — ABNORMAL LOW (ref 1.7–2.4)

## 2017-10-10 MED ORDER — K PHOS MONO-SOD PHOS DI & MONO 155-852-130 MG PO TABS
500.0000 mg | ORAL_TABLET | Freq: Every day | ORAL | Status: DC
  Administered 2017-10-10 – 2017-10-14 (×5): 500 mg via ORAL
  Filled 2017-10-10 (×5): qty 2

## 2017-10-10 MED ORDER — MAGNESIUM OXIDE 400 (241.3 MG) MG PO TABS
400.0000 mg | ORAL_TABLET | Freq: Two times a day (BID) | ORAL | Status: DC
  Administered 2017-10-10 – 2017-10-20 (×20): 400 mg via ORAL
  Filled 2017-10-10 (×21): qty 1

## 2017-10-10 NOTE — Progress Notes (Signed)
  Speech Language Pathology Treatment: Dysphagia  Patient Details Name: Amanda Hall MRN: [id number] DOB: 04-10-1950 Today's Date: 10/10/2017 Time: 1505-6979 SLP Time Calculation (min) (ACUTE ONLY): 12 min  Assessment / Plan / Recommendation Clinical Impression  Pt's symptoms of a biomechanical dysphagia have resolved - she accepted several bites of pudding and sips of thin liquid today with no s/s of aspiration, no oral holding, no multiple sub-swallows. Vocal quality was clear post-swallow and there was no coughing. She did not express concerns about her swallowing as she has in prior sessions. She  made no effort to feed herself, and after limited intake she stated "that's enough."   No MBS warranted - pt is protecting her airway. There is little else SLP service can offer. Recommend continuing current diet for now, but advancing diet per pt's preferences and as behavioral status allows.  SLP to sign off.   HPI HPI: 67 year old female with depression, anxiety, recent inpatient psychiatry admission presented to ED with increasing confusion, agitation, refusal to take her medication, not eating or drinking.Patient was admitted to inpatient psychiatry approximately 1 month ago and has been living with her sister since discharge.  She has reportedly been refusing her medications, not eating or drinking, and becoming increasingly confused and agitated. Dx AKI, hypernatremia with hypokalemia, anorexia with acute metabolic encephalopathy.       SLP Plan  All goals met       Recommendations  Diet recommendations: Dysphagia 1 (puree);Thin liquid Liquids provided via: Teaspoon;Cup;Straw Medication Administration: Crushed with puree Supervision: Staff to assist with self feeding                Oral Care Recommendations: Oral care BID Follow up Recommendations: None SLP Visit Diagnosis: Dysphagia, unspecified (R13.10) Plan: All goals met       GO                Juan Quam  Laurice 10/10/2017, 10:44 AM  Estill Bamberg L. Tivis Ringer, [place] Office number [phone]

## 2017-10-10 NOTE — Clinical Social Work Note (Signed)
Patient determined to be medically stable for discharge today. CSW initiated search for psych facility [facility]) for patient. Patient clinicals transmitted to the following facilities:  1.  [facility] 2. [facility] 3. [facility] 4. [name] 5. [facility]  CSW will continue efforts to find psych placement for patient, provide update commitment paperwork as required; and provide SW intervention services as needed through discharge.  Genelle Bal, MSW, LCSW Licensed Clinical Social Worker Clinical Social Work Department [employer] [phone]

## 2017-10-10 NOTE — Progress Notes (Signed)
[facility]                                                                              Patient Demographics  Amanda Hall, is a 67 y.o. female, DOB - 1950/08/06, ZOX:[id number]  Admit date - 10/05/2017   Admitting Physician Briscoe Deutscher, MD  Outpatient Primary MD for the patient is [name], MD  Outpatient specialists:   LOS - 5  days   Medical records reviewed and are as summarized below:    Chief Complaint  Patient presents with  . Failure To Thrive  . IVC       Brief summary   Patient is a 67 year old female with depression, anxiety, recent inpatient psychiatry admission presented to ED with increasing confusion, agitation, refusal to take her medication, not eating or drinking.Patient was admitted to inpatient psychiatry approximately 1 month ago and has been living with her sister since discharge.  She has reportedly been refusing her medications, not eating or drinking, and becoming increasingly confused and agitated.  Patient is sedated after receiving IV Ativan in the ED due to agitation.  Patient was IVC'ed prior to arrival to ED.  Sodium 152, potassium 3.1, creatinine 2.0.  Lactic acid 2.49.  UA negative   Assessment & Plan    Principal Problem: Acute kidney injury with lactic acidosis -Patient had tachycardia, hypotension, secondary to profound dehydration.  Does not meet sepsis or Sirs criteria.  No leukocytosis or any source of infection.  Chest x-ray negative, UA negative for UTI -Creatinine 2.2 on admission -Currently on dysphagia 1 diet, continue gentle hydration, creatinine 0.95  Active Problems:  Hypernatremia -Sodium 152 at the time of admission.   -Continue D5 half-normal saline with potassium supplementation until Na less than 140 -Much improved, sodium 142, will DC IV fluids once sodium less than 140  Hypokalemia with hypophosphatemia -Potassium improved, phosphorus 1.7, magnesium 1.5 -Placed on Mag-Ox, Neutra-Phos  daily  Anorexia with acute metabolic encephalopathy, underlying major depressive disorder, recurrent severe Severe protein calorie malnutrition -Continue Megace, Remeron, fluvoxamine -Appreciate psych recommendations, needs inpatient behavioral facility - continue IVC -TSH 3.5 -CT head negative for acute intracranial abnormality -Patient had esophagogram in 05/2017 which was normal -Appreciate speech evaluation, upgraded diet to dysphagia 1, patient is eating with assistance    Code Status: Full CODE STATUS DVT Prophylaxis: Heparin subcu Family Communication: No family member at the bedside   Disposition Plan: Needs to stay inpatient until inpatient behavioral health/psych facility available.  Patient is medically stable to be discharged to inpatient psych facility.  Time Spent in minutes   25 minutes  Procedures:  None  Consultants:   Psychiatry  Antimicrobials:      Medications  Scheduled Meds: . feeding supplement (ENSURE ENLIVE)  237 mL Oral TID BM  . fluvoxaMINE  150 mg Oral QHS  . heparin  5,000 Units Subcutaneous Q8H  . insulin aspart  0-5 Units Subcutaneous QHS  . insulin aspart  0-9 Units Subcutaneous TID WC  . megestrol  400 mg Oral Daily  . mirtazapine  45 mg Oral QHS  . multivitamin with minerals  1 tablet Oral Daily  .  OLANZapine zydis  10 mg Oral QHS  . simvastatin  10 mg Oral q1800  . sodium chloride flush  3 mL Intravenous Q12H  . venlafaxine XR  37.5 mg Oral Q breakfast   Continuous Infusions: . dextrose 5 % and 0.45 % NaCl with KCl 20 mEq/L 50 mL/hr at 10/09/17 1600   PRN Meds:.acetaminophen **OR** acetaminophen, naphazoline-glycerin, ondansetron **OR** ondansetron (ZOFRAN) IV   Antibiotics   Anti-infectives (From admission, onward)   None        Subjective:   Amanda Hall was seen and examined today.  Alert and awake, difficult to obtain review of system from the patient, flat affect.  Tech in the room.  No acute issues overnight.     Objective:   Vitals:   10/09/17 1759 10/09/17 2117 10/10/17 0437 10/10/17 0752  BP: 120/66 133/86 133/72 (!) 148/77  Pulse: (!) 108 (!) 110 (!) 115 (!) 112  Resp: 16 18 (!) 21 18  Temp: 98 F (36.7 C) 98 F (36.7 C) 98.2 F (36.8 C) 98.1 F (36.7 C)  TempSrc: Oral Oral  Oral  SpO2: 98% 97% 98% 98%  Weight:      Height:        Intake/Output Summary (Last 24 hours) at 10/10/2017 1359 Last data filed at 10/10/2017 0900 Gross per 24 hour  Intake 1550.55 ml  Output 0 ml  Net 1550.55 ml     Wt Readings from Last 3 Encounters:  10/08/17 41.6 kg  08/28/17 49 kg  07/13/17 54.4 kg     Exam     General: Alert and awake, NAD, cachectic, frail  Eyes:   HEENT:    Cardiovascular: S1 S2 auscultated Regular rate and rhythm. No pedal edema b/l  Respiratory: CTAB  Gastrointestinal: Soft, nontender, nondistended, + bowel sounds  Ext: muscle wasting, no pedal edema  Neuro:  Musculoskeletal: No digital cyanosis, clubbing  Skin: No rashes  Psych: flat affect  Data Reviewed:  I have personally reviewed following labs and imaging studies  Micro Results Recent Results (from the past 240 hour(s))  Blood Culture (routine x 2)     Status: None (Preliminary result)   Collection Time: 10/05/17  6:43 PM  Result Value Ref Range Status   Specimen Description BLOOD RIGHT HAND  Final   Special Requests   Final    BOTTLES DRAWN AEROBIC AND ANAEROBIC Blood Culture adequate volume   Culture   Final    NO GROWTH 4 DAYS Performed at [facility], [address] [place], [place] [address]    Report Status PENDING  Incomplete  Blood Culture (routine x 2)     Status: None (Preliminary result)   Collection Time: 10/05/17  6:47 PM  Result Value Ref Range Status   Specimen Description BLOOD LEFT ANTECUBITAL  Final   Special Requests   Final    BOTTLES DRAWN AEROBIC AND ANAEROBIC Blood Culture adequate volume   Culture   Final    NO GROWTH 4 DAYS Performed at [facility], [address], [place], [place] [address]    Report Status PENDING  Incomplete  Urine culture     Status: Abnormal   Collection Time: 10/05/17  6:57 PM  Result Value Ref Range Status   Specimen Description URINE, CATHETERIZED  Final   Special Requests NONE  Final   Culture (A)  Final    >=100,000 COLONIES/mL AEROCOCCUS SPECIES Standardized susceptibility testing for this organism is not available. Performed at [facility], [address] [place], [place] [address]    Report Status 10/08/2017 FINAL  Final    Radiology Reports Ct Head Wo Contrast  Result Date: 10/05/2017 CLINICAL DATA:  Altered level of consciousness EXAM: CT HEAD WITHOUT CONTRAST TECHNIQUE: Contiguous axial images were obtained from the base of the skull through the vertex without intravenous contrast. COMPARISON:  None. FINDINGS: Brain: Mild age related volume loss. No acute intracranial abnormality. Specifically, no hemorrhage, hydrocephalus, mass lesion, acute infarction, or significant intracranial injury. Vascular: No hyperdense vessel or unexpected calcification. Skull: No acute calvarial abnormality. Sinuses/Orbits: Visualized paranasal sinuses and mastoids clear. Orbital soft tissues unremarkable. Other: None IMPRESSION: No acute intracranial abnormality. Electronically Signed   By: Charlett Nose M.D.   On: 10/05/2017 18:41   Dg Chest Port 1 View  Result Date: 10/05/2017 CLINICAL DATA:  Altered mental status and failure to thrive for several months. Confusion. EXAM: PORTABLE CHEST 1 VIEW COMPARISON:  08/28/2017 FINDINGS: Emphysematous hyperinflation of the lungs. Heart size with tortuous atherosclerotic aorta. No consolidation or CHF. No pleural effusion or pneumothorax. No acute fracture, malalignment nor bone destruction. IMPRESSION: Hyperinflated lungs without active pulmonary disease. Tortuous slightly atherosclerotic aorta. Electronically Signed   By: Tollie Eth M.D.   On: 10/05/2017 18:50     Lab Data:  CBC: Recent Labs  Lab 10/05/17 1813 10/05/17 1827 10/06/17 0808  WBC 11.5*  --  7.8  NEUTROABS 9.6*  --  5.7  HGB 15.6* 15.0 13.0  HCT 47.7* 44.0 40.9  MCV 91.6  --  92.3  PLT 224  --  134*   Basic Metabolic Panel: Recent Labs  Lab 10/05/17 1813  10/06/17 0808 10/07/17 0722 10/08/17 0416 10/09/17 0413 10/10/17 0426  NA 152*   < > 153* 147* 144 143 142  K 3.1*   < > 2.9* 2.9* 3.1* 3.7 3.8  CL 109   < > 114* 110 108 111 112*  CO2 27   < > 27 29 28 25 27   GLUCOSE 192*   < > 90 143* 127* 95 113*  BUN 97*   < > 77* 42* 22 15 19   CREATININE 2.07*   < > 1.60* 1.04* 0.94 0.93 0.95  CALCIUM 11.7*   < > 10.1 9.6 9.6 9.7 9.6  MG 2.5*  --   --  1.7  --   --  1.5*  PHOS  --   --   --  1.3*  --   --  1.7*   < > = values in this interval not displayed.   GFR: Estimated Creatinine Clearance: 38.3 mL/min (by C-G formula based on SCr of 0.95 mg/dL). Liver Function Tests: Recent Labs  Lab 10/05/17 1813  AST 41  ALT 26  ALKPHOS 85  BILITOT 1.1  PROT 6.7  ALBUMIN 3.7   No results for input(s): LIPASE, AMYLASE in the last 168 hours. No results for input(s): AMMONIA in the last 168 hours. Coagulation Profile: No results for input(s): INR, PROTIME in the last 168 hours. Cardiac Enzymes: No results for input(s): CKTOTAL, CKMB, CKMBINDEX, TROPONINI in the last 168 hours. BNP (last 3 results) No results for input(s): PROBNP in the last 8760 hours. HbA1C: No results for input(s): HGBA1C in the last 72 hours. CBG: Recent Labs  Lab 10/09/17 1212 10/09/17 1750 10/09/17 2120 10/10/17 0752 10/10/17 1205  GLUCAP 230* 138* 94 109* 119*   Lipid Profile: No results for input(s): CHOL, HDL, LDLCALC, TRIG, CHOLHDL, LDLDIRECT in the last 72 hours. Thyroid Function  Tests: No results for input(s): TSH, T4TOTAL, FREET4, T3FREE, THYROIDAB in the last 72 hours. Anemia Panel: No results for input(s): VITAMINB12, FOLATE, FERRITIN, TIBC, IRON, RETICCTPCT in the last 72  hours. Urine analysis:    Component Value Date/Time   COLORURINE YELLOW 10/05/2017 1813   APPEARANCEUR CLEAR 10/05/2017 1813   LABSPEC 1.021 10/05/2017 1813   PHURINE 5.0 10/05/2017 1813   GLUCOSEU NEGATIVE 10/05/2017 1813   HGBUR NEGATIVE 10/05/2017 1813   BILIRUBINUR NEGATIVE 10/05/2017 1813   KETONESUR NEGATIVE 10/05/2017 1813   PROTEINUR NEGATIVE 10/05/2017 1813   UROBILINOGEN 0.2 12/15/2006 1654   NITRITE NEGATIVE 10/05/2017 1813   LEUKOCYTESUR NEGATIVE 10/05/2017 1813     Amanda Hall M.D. [facility] 10/10/2017, 1:59 PM  Pager: [phone] Between 7am to 7pm - call Pager - [phone]  After 7pm go to [email or web address] - password [email or web address]  Call night coverage person covering after 7pm

## 2017-10-11 LAB — GLUCOSE, CAPILLARY
GLUCOSE-CAPILLARY: 124 mg/dL — AB (ref 70–99)
GLUCOSE-CAPILLARY: 109 mg/dL — AB (ref 70–99)
GLUCOSE-CAPILLARY: 123 mg/dL — AB (ref 70–99)
GLUCOSE-CAPILLARY: 136 mg/dL — AB (ref 70–99)

## 2017-10-11 NOTE — Clinical Social Work Note (Addendum)
Patient remains IVC'd and vew IVC paperwork completed and faxed to Magistrate's office. Original IVC paperwork in patient's chart. CSW continuing to seek psych placement for patient.  Call made to [facility] [phone]) and per Britta Mccreedy, patient's cliincals can be faxed to them [phone]).  CSW will continue to follow, provided SW intervention services as appropriate, through discharge to a psych facility when a bed becomes available.   Genelle Bal, MSW, LCSW Licensed Clinical Social Worker Clinical Social Work Department [employer] [phone]

## 2017-10-11 NOTE — Progress Notes (Signed)
[facility]                                                                              Patient Demographics  Amanda Hall, is a 67 y.o. female, DOB - August 07, 1950, ZOX:[id number]  Admit date - 10/05/2017   Admitting Physician Briscoe Deutscher, MD  Outpatient Primary MD for the patient is [name], MD  Outpatient specialists:   LOS - 6  days   Medical records reviewed and are as summarized below:    Chief Complaint  Patient presents with  . Failure To Thrive  . IVC       Brief summary   Patient is a 67 year old female with depression, anxiety, recent inpatient psychiatry admission presented to ED with increasing confusion, agitation, refusal to take her medication, not eating or drinking.Patient was admitted to inpatient psychiatry approximately 1 month ago and has been living with her sister since discharge.  She has reportedly been refusing her medications, not eating or drinking, and becoming increasingly confused and agitated.  Patient is sedated after receiving IV Ativan in the ED due to agitation.  Patient was IVC'ed prior to arrival to ED.  Sodium 152, potassium 3.1, creatinine 2.0.  Lactic acid 2.49.  UA negative   Assessment & Plan    Principal Problem: Acute kidney injury with lactic acidosis -Patient had tachycardia, hypotension, secondary to profound dehydration.  Does not meet sepsis or Sirs criteria.  No leukocytosis or any source of infection.  Chest x-ray negative, UA negative for UTI -Creatinine 2.2 on admission.  Improved to 0.9 with IV fluids -Continue dysphagia 1 diet  Active Problems:  Hypernatremia -Sodium 152 at the time of admission.   -Continue D5 half-normal saline with potassium supplementation until Na less than 140   Hypokalemia with hypophosphatemia -Continue magnesium and phosphorus oral supplementation daily  Anorexia with acute metabolic encephalopathy, underlying major depressive disorder, recurrent  severe Severe protein calorie malnutrition -Continue Megace, Remeron, fluvoxamine -Appreciate psych recommendations, needs inpatient behavioral facility - continue IVC -TSH 3.5 -CT head negative for acute intracranial abnormality -Patient had esophagogram in 05/2017 which was normal -Appreciate speech evaluation, upgraded diet to dysphagia 1, patient is eating with assistance    Code Status: Full CODE STATUS DVT Prophylaxis: Heparin subcu Family Communication: No family member at the bedside   Disposition Plan: Needs to stay inpatient until inpatient behavioral health/psych facility available.  Patient is medically stable to be discharged to inpatient psych facility.  Time Spent in minutes   25 minutes  Procedures:  None  Consultants:   Psychiatry  Antimicrobials:      Medications  Scheduled Meds: . feeding supplement (ENSURE ENLIVE)  237 mL Oral TID BM  . fluvoxaMINE  150 mg Oral QHS  . heparin  5,000 Units Subcutaneous Q8H  . insulin aspart  0-5 Units Subcutaneous QHS  . insulin aspart  0-9 Units Subcutaneous TID WC  . magnesium oxide  400 mg Oral BID  . megestrol  400 mg Oral Daily  . mirtazapine  45 mg Oral QHS  . multivitamin with minerals  1 tablet Oral Daily  . OLANZapine zydis  10 mg  Oral QHS  . phosphorus  500 mg Oral Daily  . simvastatin  10 mg Oral q1800  . sodium chloride flush  3 mL Intravenous Q12H  . venlafaxine XR  37.5 mg Oral Q breakfast   Continuous Infusions: . dextrose 5 % and 0.45 % NaCl with KCl 20 mEq/L 50 mL/hr at 10/10/17 2149   PRN Meds:.acetaminophen **OR** acetaminophen, naphazoline-glycerin, ondansetron **OR** ondansetron (ZOFRAN) IV   Antibiotics   Anti-infectives (From admission, onward)   None        Subjective:   Amanda Hall was seen and examined today.  Patient sitting in the bed, alert and awake however flat affect and does not respond to any questions.  Tech in the room.  No acute events overnight.    Objective:    Vitals:   10/10/17 0752 10/10/17 1512 10/10/17 2111 10/11/17 0738  BP: (!) 148/77 136/75 134/66 124/75  Pulse: (!) 112 (!) 115 (!) 126 (!) 117  Resp: 18 18 19    Temp: 98.1 F (36.7 C) 98.9 F (37.2 C) 98 F (36.7 C) 98.1 F (36.7 C)  TempSrc: Oral Oral Oral Oral  SpO2: 98% 98% 98% 95%  Weight:      Height:        Intake/Output Summary (Last 24 hours) at 10/11/2017 1317 Last data filed at 10/11/2017 0852 Gross per 24 hour  Intake 1429.93 ml  Output 500 ml  Net 929.93 ml     Wt Readings from Last 3 Encounters:  10/08/17 41.6 kg  08/28/17 49 kg  07/13/17 54.4 kg     Exam    General: Awake, cachectic, frail  Eyes:   HEENT:    Cardiovascular: S1 S2 auscultated, Regular rate and rhythm. No pedal edema b/l  Respiratory: Clear to auscultation bilaterally, no wheezing, rales or rhonchi  Gastrointestinal: Soft, nontender, nondistended, + bowel sounds  Ext : muscle atrophy, no pedal edema  Neuro: does not follow commands  Musculoskeletal: No digital cyanosis, clubbing  Skin: No rashes  Psych: flat affect    Data Reviewed:  I have personally reviewed following labs and imaging studies  Micro Results Recent Results (from the past 240 hour(s))  Blood Culture (routine x 2)     Status: None   Collection Time: 10/05/17  6:43 PM  Result Value Ref Range Status   Specimen Description BLOOD RIGHT HAND  Final   Special Requests   Final    BOTTLES DRAWN AEROBIC AND ANAEROBIC Blood Culture adequate volume   Culture   Final    NO GROWTH 5 DAYS Performed at [facility], [address] [place], [place] [address]    Report Status 10/10/2017 FINAL  Final  Blood Culture (routine x 2)     Status: None   Collection Time: 10/05/17  6:47 PM  Result Value Ref Range Status   Specimen Description BLOOD LEFT ANTECUBITAL  Final   Special Requests   Final    BOTTLES DRAWN AEROBIC AND ANAEROBIC Blood Culture adequate volume   Culture   Final    NO GROWTH 5  DAYS Performed at [facility], [address], [place], [place] [address]    Report Status 10/10/2017 FINAL  Final  Urine culture     Status: Abnormal   Collection Time: 10/05/17  6:57 PM  Result Value Ref Range Status   Specimen Description URINE, CATHETERIZED  Final   Special Requests NONE  Final   Culture (A)  Final    >=100,000 COLONIES/mL AEROCOCCUS  SPECIES Standardized susceptibility testing for this organism is not available. Performed at [facility], [address], [place], [place] [address]    Report Status 10/08/2017 FINAL  Final    Radiology Reports Ct Head Wo Contrast  Result Date: 10/05/2017 CLINICAL DATA:  Altered level of consciousness EXAM: CT HEAD WITHOUT CONTRAST TECHNIQUE: Contiguous axial images were obtained from the base of the skull through the vertex without intravenous contrast. COMPARISON:  None. FINDINGS: Brain: Mild age related volume loss. No acute intracranial abnormality. Specifically, no hemorrhage, hydrocephalus, mass lesion, acute infarction, or significant intracranial injury. Vascular: No hyperdense vessel or unexpected calcification. Skull: No acute calvarial abnormality. Sinuses/Orbits: Visualized paranasal sinuses and mastoids clear. Orbital soft tissues unremarkable. Other: None IMPRESSION: No acute intracranial abnormality. Electronically Signed   By: Charlett Nose M.D.   On: 10/05/2017 18:41   Dg Chest Port 1 View  Result Date: 10/05/2017 CLINICAL DATA:  Altered mental status and failure to thrive for several months. Confusion. EXAM: PORTABLE CHEST 1 VIEW COMPARISON:  08/28/2017 FINDINGS: Emphysematous hyperinflation of the lungs. Heart size with tortuous atherosclerotic aorta. No consolidation or CHF. No pleural effusion or pneumothorax. No acute fracture, malalignment nor bone destruction. IMPRESSION: Hyperinflated lungs without active pulmonary disease. Tortuous slightly atherosclerotic aorta. Electronically Signed   By: Tollie Eth  M.D.   On: 10/05/2017 18:50    Lab Data:  CBC: Recent Labs  Lab 10/05/17 1813 10/05/17 1827 10/06/17 0808  WBC 11.5*  --  7.8  NEUTROABS 9.6*  --  5.7  HGB 15.6* 15.0 13.0  HCT 47.7* 44.0 40.9  MCV 91.6  --  92.3  PLT 224  --  134*   Basic Metabolic Panel: Recent Labs  Lab 10/05/17 1813  10/06/17 0808 10/07/17 0722 10/08/17 0416 10/09/17 0413 10/10/17 0426  NA 152*   < > 153* 147* 144 143 142  K 3.1*   < > 2.9* 2.9* 3.1* 3.7 3.8  CL 109   < > 114* 110 108 111 112*  CO2 27   < > 27 29 28 25 27   GLUCOSE 192*   < > 90 143* 127* 95 113*  BUN 97*   < > 77* 42* 22 15 19   CREATININE 2.07*   < > 1.60* 1.04* 0.94 0.93 0.95  CALCIUM 11.7*   < > 10.1 9.6 9.6 9.7 9.6  MG 2.5*  --   --  1.7  --   --  1.5*  PHOS  --   --   --  1.3*  --   --  1.7*   < > = values in this interval not displayed.   GFR: Estimated Creatinine Clearance: 38.3 mL/min (by C-G formula based on SCr of 0.95 mg/dL). Liver Function Tests: Recent Labs  Lab 10/05/17 1813  AST 41  ALT 26  ALKPHOS 85  BILITOT 1.1  PROT 6.7  ALBUMIN 3.7   No results for input(s): LIPASE, AMYLASE in the last 168 hours. No results for input(s): AMMONIA in the last 168 hours. Coagulation Profile: No results for input(s): INR, PROTIME in the last 168 hours. Cardiac Enzymes: No results for input(s): CKTOTAL, CKMB, CKMBINDEX, TROPONINI in the last 168 hours. BNP (last 3 results) No results for input(s): PROBNP in the last 8760 hours. HbA1C: No results for input(s): HGBA1C in the last 72 hours. CBG: Recent Labs  Lab 10/10/17 1205 10/10/17 1625 10/10/17 2109 10/11/17 0801 10/11/17 1122  GLUCAP 119* 126* 165* 124* 109*   Lipid Profile: No results  for input(s): CHOL, HDL, LDLCALC, TRIG, CHOLHDL, LDLDIRECT in the last 72 hours. Thyroid Function Tests: No results for input(s): TSH, T4TOTAL, FREET4, T3FREE, THYROIDAB in the last 72 hours. Anemia Panel: No results for input(s): VITAMINB12, FOLATE, FERRITIN, TIBC, IRON,  RETICCTPCT in the last 72 hours. Urine analysis:    Component Value Date/Time   COLORURINE YELLOW 10/05/2017 1813   APPEARANCEUR CLEAR 10/05/2017 1813   LABSPEC 1.021 10/05/2017 1813   PHURINE 5.0 10/05/2017 1813   GLUCOSEU NEGATIVE 10/05/2017 1813   HGBUR NEGATIVE 10/05/2017 1813   BILIRUBINUR NEGATIVE 10/05/2017 1813   KETONESUR NEGATIVE 10/05/2017 1813   PROTEINUR NEGATIVE 10/05/2017 1813   UROBILINOGEN 0.2 12/15/2006 1654   NITRITE NEGATIVE 10/05/2017 1813   LEUKOCYTESUR NEGATIVE 10/05/2017 1813     Amanda Hall M.D. [facility] 10/11/2017, 1:17 PM  Pager: [phone] Between 7am to 7pm - call Pager - [phone]  After 7pm go to [email or web address] - password [email or web address]  Call night coverage person covering after 7pm

## 2017-10-12 DIAGNOSIS — E43 Unspecified severe protein-calorie malnutrition: Secondary | ICD-10-CM

## 2017-10-12 DIAGNOSIS — R1310 Dysphagia, unspecified: Secondary | ICD-10-CM

## 2017-10-12 LAB — CBC
HCT: 36.9 % (ref 36.0–46.0)
Hemoglobin: 12.1 g/dL (ref 12.0–15.0)
MCH: 29.7 pg (ref 26.0–34.0)
MCHC: 32.8 g/dL (ref 30.0–36.0)
MCV: 90.4 fL (ref 78.0–100.0)
PLATELETS: 210 10*3/uL (ref 150–400)
RBC: 4.08 MIL/uL (ref 3.87–5.11)
RDW: 13.4 % (ref 11.5–15.5)
WBC: 6.8 10*3/uL (ref 4.0–10.5)

## 2017-10-12 LAB — COMPREHENSIVE METABOLIC PANEL
ALK PHOS: 61 U/L (ref 38–126)
ALT: 22 U/L (ref 0–44)
AST: 33 U/L (ref 15–41)
Albumin: 2.9 g/dL — ABNORMAL LOW (ref 3.5–5.0)
Anion gap: 3 — ABNORMAL LOW (ref 5–15)
BILIRUBIN TOTAL: 0.5 mg/dL (ref 0.3–1.2)
BUN: 28 mg/dL — ABNORMAL HIGH (ref 8–23)
CALCIUM: 9 mg/dL (ref 8.9–10.3)
CO2: 27 mmol/L (ref 22–32)
CREATININE: 0.91 mg/dL (ref 0.44–1.00)
Chloride: 110 mmol/L (ref 98–111)
GFR calc Af Amer: >60 mL/min (ref >60)
Glucose, Bld: 112 mg/dL — ABNORMAL HIGH (ref 70–99)
Potassium: 3.7 mmol/L (ref 3.5–5.1)
Sodium: 140 mmol/L (ref 135–145)
TOTAL PROTEIN: 5.4 g/dL — AB (ref 6.5–8.1)

## 2017-10-12 LAB — GLUCOSE, CAPILLARY
GLUCOSE-CAPILLARY: 124 mg/dL — AB (ref 70–99)
Glucose-Capillary: 125 mg/dL — ABNORMAL HIGH (ref 70–99)
Glucose-Capillary: 96 mg/dL (ref 70–99)

## 2017-10-12 MED ORDER — ENOXAPARIN SODIUM 40 MG/0.4ML ~~LOC~~ SOLN
40.0000 mg | SUBCUTANEOUS | Status: DC
  Administered 2017-10-13: 40 mg via SUBCUTANEOUS
  Filled 2017-10-12: qty 0.4

## 2017-10-12 NOTE — Care Management Note (Signed)
Case Management Note  Patient Details  Name: Amanda Hall MRN: [id number] Date of Birth: Jan 02, 1951  Subjective/Objective:                 Failure to Thrive, recurrent MDD   Action/Plan:  Patient IVC renewed 10/1 by CSW. Plan for inpatient psych as facilitated by CSW, in process. Per MD notes, medically clear for DC 9/30. No further CM needs.   Expected Discharge Date:                  Expected Discharge Plan:  Psychiatric Hospital  In-House Referral:  Clinical Social Work  Discharge planning Services  CM Consult  Post Acute Care Choice:    Choice offered to:     DME Arranged:    DME Agency:     HH Arranged:    HH Agency:     Status of Service:  Completed, signed off  If discussed at [employer], dates discussed:    Additional Comments:  Lawerance Sabal, RN 10/12/2017, 11:14 AM

## 2017-10-12 NOTE — Progress Notes (Signed)
  PROGRESS NOTE  Amanda Hall ONG:[id number] DOB: October 30, 1950 DOA: 10/05/2017 PCP: [name], MD  Brief Narrative: 67 year old woman PMH depression, anxiety, recent inpatient psychiatric admission, presented with increased confusion, agitation, refusal to take medications, not eating, not drinking.  Admitted for acute kidney injury, acute encephalopathy.  Assessment/Plan Depression, anxiety --Needs inpatient psychiatric stabilization per psychiatry.  Continue Software engineer. --Continue home medications: Luvox 150 mg qhs for depression and anxiety, Remeron 45 mg qhs for depression and anxiety, Zyprexa 10 mg qhs for anxiety and mood stabilization and Effexor 37.5 mg daily for depression and anxiety.  Acute kidney injury, dehydration, hypernatremia secondary to refusing to eat and drink secondary to underlying psychiatric disorder --Resolved with hydration.  Acute encephalopathy with agitation, confusion on admission, secondary to anxiety, depression.  No evidence of infection.  Metabolic derangement corrected..  IVC paperwork completed in the emergency department.  Head CT negative for acute findings. --Supportive care.  Treat anxiety, depression.  SIRS.  Afebrile but had mild leukocytosis, tachycardia and elevated lactic acid on admission.  Urinalysis and chest x-ray without evidence of infection.  Thought secondary to dehydration. --Resolved.  Dysphagia. --Continue dysphagia 1 diet, thin liquids, medications crushed with pure  Severe malnutrition, underweight --Per dietitian   Remains medically stable for transfer to inpatient psychiatric facility  DVT prophylaxis: enoxaparin Code Status: full Family Communication: none Disposition Plan: inpatient psych    [name], MD  [facility] Direct contact: [phone] --Via amion app OR  --[email or web address]; password [email or web address]  7PM-7AM contact night coverage as above 10/12/2017, 7:20 PM  LOS: 7 days    Consultants:  Psychiatry  Procedures:    Antimicrobials:    Interval history/Subjective: Feels ok. Not eating per sitter.  Objective: Vitals:  Vitals:   10/12/17 0432 10/12/17 0825  BP: 126/71 124/74  Pulse: (!) 114 (!) 113  Resp: 19 19  Temp: 98.7 F (37.1 C)   SpO2: 98% 98%    Exam:  Constitutional:  . Appears anxious and uncomfortable Respiratory:  . CTA bilaterally, no w/r/r.  . Respiratory effort normal. Cardiovascular:  . RRR, no m/r/g . No LE extremity edema   Psychiatric:  . Mental status . Mood difficult to gauge. Odd affect.   I have personally reviewed the following:   Data: . CBG stable . CMP and CBC unremarkable  Scheduled Meds: . [START ON 10/13/2017] enoxaparin (LOVENOX) injection  40 mg Subcutaneous Q24H  . feeding supplement (ENSURE ENLIVE)  237 mL Oral TID BM  . fluvoxaMINE  150 mg Oral QHS  . magnesium oxide  400 mg Oral BID  . megestrol  400 mg Oral Daily  . mirtazapine  45 mg Oral QHS  . multivitamin with minerals  1 tablet Oral Daily  . OLANZapine zydis  10 mg Oral QHS  . phosphorus  500 mg Oral Daily  . simvastatin  10 mg Oral q1800  . sodium chloride flush  3 mL Intravenous Q12H  . venlafaxine XR  37.5 mg Oral Q breakfast   Continuous Infusions: . dextrose 5 % and 0.45 % NaCl with KCl 20 mEq/L 50 mL/hr at 10/11/17 1710    Principal Problem:   MDD (major depressive disorder), recurrent severe, without psychosis (HCC) Active Problems:   MDD (major depressive disorder)   Anorexia   Dysphagia   Severe malnutrition (HCC)   LOS: 7 days

## 2017-10-13 LAB — GLUCOSE, CAPILLARY
GLUCOSE-CAPILLARY: 114 mg/dL — AB (ref 70–99)
GLUCOSE-CAPILLARY: 114 mg/dL — AB (ref 70–99)
Glucose-Capillary: 98 mg/dL (ref 70–99)

## 2017-10-13 NOTE — Progress Notes (Signed)
  PROGRESS NOTE  Amanda Hall ZYS:[id number] DOB: 1950/12/29 DOA: 10/05/2017 PCP: Amanda Savers, MD  Brief Narrative: 67 year old woman PMH depression, anxiety, recent inpatient psychiatric admission, presented with increased confusion, agitation, refusal to take medications, not eating, not drinking.  Admitted for acute kidney injury, acute encephalopathy.  Assessment/Plan Depression, anxiety --Await inpatient psychiatric facility availability.   --Child psychotherapist reports psychiatry reevaluation needed.   --Continue home medications: Luvox 150 mg qhs for depression and anxiety, Remeron 45 mg qhs for depression and anxiety, Zyprexa 10 mg qhs for anxiety and mood stabilization and Effexor 37.5 mg daily for depression and anxiety.  Acute kidney injury, dehydration, hypernatremia secondary to refusing to eat and drink secondary to underlying psychiatric disorder --Resolved with hydration.  Appetite, oral intake remains poor.  Acute encephalopathy with agitation, confusion on admission, secondary to anxiety, depression.  No evidence of infection.  Metabolic derangement corrected..  IVC paperwork completed in the emergency department.  Head CT negative for acute findings. --Continue supportive care, treatment for anxiety and depression.  SIRS.  Afebrile but had mild leukocytosis, tachycardia and elevated lactic acid on admission.  Urinalysis and chest x-ray without evidence of infection.  Thought secondary to dehydration. --Resolved.  Dysphagia. --Continue dysphagia 1 diet, thin liquids, medications crushed with pure  Severe malnutrition, underweight --per dietician   Await psychiatry bed availability.  Medically stable.  DVT prophylaxis: enoxaparin Code Status: full Family Communication: none Disposition Plan: inpatient psych    Amanda Sacks, MD  [facility] Direct contact: [phone] --Via amion app OR  --[email or web address]; password [email or web address]  7PM-7AM contact night  coverage as above 10/13/2017, 2:34 PM  LOS: 8 days   Consultants:  Psychiatry  Procedures:    Antimicrobials:    Interval history/Subjective: Depressed. Not eating. "What is wrong with me?"  Objective: Vitals:  Vitals:   10/13/17 0349 10/13/17 0826  BP: 126/72 (!) 148/86  Pulse: 81 (!) 113  Resp: 20 18  Temp: 98 F (36.7 C)   SpO2: 99% 98%    Exam: Constitutional:   . Appears anxious, uncomfortable Respiratory:  . CTA bilaterally, no w/r/r.  . Respiratory effort normal.  Cardiovascular:  . RRR, no m/r/g . No LE extremity edema   Abdomen:  Marland Kitchen Mild generalized tenderness Psychiatric:  . Mental status o Mood depressed, anxious affect  Examined with sitter at bedside  I have personally reviewed the following:   Data: . CBG remains stable  Scheduled Meds: . enoxaparin (LOVENOX) injection  40 mg Subcutaneous Q24H  . feeding supplement (ENSURE ENLIVE)  237 mL Oral TID BM  . fluvoxaMINE  150 mg Oral QHS  . magnesium oxide  400 mg Oral BID  . megestrol  400 mg Oral Daily  . mirtazapine  45 mg Oral QHS  . multivitamin with minerals  1 tablet Oral Daily  . OLANZapine zydis  10 mg Oral QHS  . phosphorus  500 mg Oral Daily  . simvastatin  10 mg Oral q1800  . sodium chloride flush  3 mL Intravenous Q12H  . venlafaxine XR  37.5 mg Oral Q breakfast   Continuous Infusions: . dextrose 5 % and 0.45 % NaCl with KCl 20 mEq/L 50 mL/hr at 10/11/17 1710    Principal Problem:   MDD (major depressive disorder), recurrent severe, without psychosis (HCC) Active Problems:   MDD (major depressive disorder)   Anorexia   Dysphagia   Severe malnutrition (HCC)   LOS: 8 days

## 2017-10-13 NOTE — Progress Notes (Signed)
Patient ate two bites of vanilla ice cream with her medication. Patient then refused to take any more bites of food at this time. She drank one sip of water then stated, "I just can't take it anymore." Patient is refusing to eat her dinner at this time. Will continue to monitor patient.   Lillia Pauls RN

## 2017-10-13 NOTE — Progress Notes (Signed)
Nutrition Follow-up  DOCUMENTATION CODES:   Severe malnutrition in context of social or environmental circumstances, Underweight  INTERVENTION:   Pt would ultimately likely benefit from G-tube placement but recommend considering Cortrak tube with initiation of TF  Nothing else to offer from nutrition standpoint at this time other than nutrition support as pt refusing to take po, eating 0% of meals, not taking supplements  NUTRITION DIAGNOSIS:   Severe Malnutrition related to social / environmental circumstances(severe major depressive disorder, refractory depression, anxiety) as evidenced by severe fat depletion, severe muscle depletion.  Continues  GOAL:   Patient will meet greater than or equal to 90% of their needs  Not Met  MONITOR:   PO intake, Supplement acceptance, Labs, Weight trends  REASON FOR ASSESSMENT:   Malnutrition Screening Tool    ASSESSMENT:   67 yo female admitted with AKI, dehydration, anorexia with acute metabolic encephalopathy, FTT. Pt has been refusing to eat, drink or take medications. Pt has severe protein calorie malnutrition. Pt with recent inpatient psych hospitalization 1 month ago. Pt reports difficulty swallowing although pt's sister believes this to be subjective. Pt with MDD, severe, with psych consult. PMH includes anxiety, depression, malnutrition   Noted pt awaiting bed at Inpatient Psych facility; pt currently IVC   Pt is not eating. Recorded po intake 0% of meals. Per RN, pt is refusing to eat, refusing to take any Ensure. Pt not even wanting to take applesauce with meds  Weight relatively stable  Labs: reviewed Meds: D5-1/2 NS at 50 ml/hr, megace, remeron, MVI   Diet Order:   Diet Order            DIET - DYS 1 Room service appropriate? Yes; Fluid consistency: Thin  Diet effective now              EDUCATION NEEDS:   Not appropriate for education at this time  Skin:  Skin Assessment: Reviewed RN Assessment  Last BM:   10/1  Height:   Ht Readings from Last 1 Encounters:  10/05/17 5' 2"  (1.575 m)    Weight:   Wt Readings from Last 1 Encounters:  10/13/17 42.2 kg    Ideal Body Weight:  50 kg  BMI:  Body mass index is 17.02 kg/m.  Estimated Nutritional Needs:   Kcal:  1430-1640 kcals   Protein:  72-82 g   Fluid:  >/= 1.4 L   Amanda Passey MS, RD, LDN, CNSC [phone] Pager  [phone] Weekend/On-Call Pager

## 2017-10-14 LAB — MAGNESIUM: Magnesium: 2.2 mg/dL (ref 1.7–2.4)

## 2017-10-14 LAB — BASIC METABOLIC PANEL
Anion gap: 8 (ref 5–15)
BUN: 14 mg/dL (ref 8–23)
CALCIUM: 9.8 mg/dL (ref 8.9–10.3)
CO2: 25 mmol/L (ref 22–32)
Chloride: 105 mmol/L (ref 98–111)
Creatinine, Ser: 0.75 mg/dL (ref 0.44–1.00)
Glucose, Bld: 118 mg/dL — ABNORMAL HIGH (ref 70–99)
Potassium: 3.7 mmol/L (ref 3.5–5.1)
Sodium: 138 mmol/L (ref 135–145)

## 2017-10-14 LAB — GLUCOSE, CAPILLARY
GLUCOSE-CAPILLARY: 129 mg/dL — AB (ref 70–99)
Glucose-Capillary: 158 mg/dL — ABNORMAL HIGH (ref 70–99)
Glucose-Capillary: 123 mg/dL — ABNORMAL HIGH (ref 70–99)
Glucose-Capillary: 119 mg/dL — ABNORMAL HIGH (ref 70–99)

## 2017-10-14 LAB — PHOSPHORUS: PHOSPHORUS: 2.6 mg/dL (ref 2.5–4.6)

## 2017-10-14 MED ORDER — ENOXAPARIN SODIUM 30 MG/0.3ML ~~LOC~~ SOLN
30.0000 mg | SUBCUTANEOUS | Status: DC
  Administered 2017-10-15 – 2017-11-26 (×42): 30 mg via SUBCUTANEOUS
  Filled 2017-10-14 (×42): qty 0.3

## 2017-10-14 MED ORDER — OLANZAPINE 5 MG PO TABS
2.5000 mg | ORAL_TABLET | Freq: Every day | ORAL | Status: DC | PRN
  Administered 2017-10-14 – 2017-10-16 (×3): 2.5 mg via ORAL
  Filled 2017-10-14 (×3): qty 1

## 2017-10-14 MED ORDER — OLANZAPINE 5 MG PO TBDP
12.5000 mg | ORAL_TABLET | Freq: Every day | ORAL | Status: DC
  Administered 2017-10-14 – 2017-10-18 (×4): 12.5 mg via ORAL
  Filled 2017-10-14 (×6): qty 2.5

## 2017-10-14 NOTE — Progress Notes (Signed)
Palliative Medicine RN Note: Consult order noted. During chart review it is noted in Dr Kirstie Peri consult on 9/26 "Patient warrants inpatient psychiatric hospitalization for severe depression and anxiety causing significant impairments in daily functioning with poor PO intake and severe malnutrition," and she may require ECT to treat this.  No life-limiting illness noted except for anorexia d/t psychiatric diagnosis. Pt is pending psych placement.  Discussed patient w PMT providers. Palliative medicine helps with symptom reduction and goal-setting in patients with life-limiting diseases. At this time, PMT does not have a role in the care of Amanda Hall due to absence of terminal diagnosis and her unstable psychiatric symptoms.  Consult will be cancelled at this time.   Margret Chance Donya Hitch, RN, BSN, [facility] Palliative Medicine Team 10/14/2017 9:45 AM Office [phone]

## 2017-10-14 NOTE — Progress Notes (Signed)
Patient was compliant in taking her morning pills this morning crushed in apple sauce. Will continue to monitor.   Lillia Pauls RN

## 2017-10-14 NOTE — Progress Notes (Signed)
PROGRESS NOTE  Amanda Hall ZOX:[id number] DOB: June 18, 1950 DOA: 10/05/2017 PCP: [name], MD  Brief Narrative: 67 year old woman PMH depression, anxiety, recent inpatient psychiatric admission, presented with increased confusion, agitation, refusal to take medications, not eating, not drinking.  Admitted for acute kidney injury, acute encephalopathy.  Assessment/Plan Depression, anxiety --Continue to await psychiatric facility availability.   --Psychiatrist has seen patient again and recommends to increase Zyprexa to 12.5 QHS and 2.5 daily PRN anxiety await inpatient psychiatric facility availability.   --will continue Luvox 150 mg qhs for depression and anxiety, Remeron 45 mg qhs for depression and anxiety and Effexor 37.5 mg daily for depression and anxiety.  Acute kidney injury, dehydration, hypernatremia secondary to refusing to eat and drink secondary to underlying psychiatric disorder --Resolved with hydration.  Appetite, oral intake remains poor.  Acute encephalopathy with agitation, confusion on admission, secondary to anxiety, depression.  No evidence of infection.  Metabolic derangement corrected..  IVC paperwork completed in the emergency department.  Head CT negative for acute findings. --Continue supportive care, treatment for anxiety and depression.  SIRS.  Afebrile but had mild leukocytosis, tachycardia and elevated lactic acid on admission.  Urinalysis and chest x-ray without evidence of infection.  Thought secondary to dehydration. --Resolved.  Dysphagia. --Continue dysphagia 1 diet, thin liquids, medications crushed with pure  Severe malnutrition, underweight --Patient a 25% of breakfast this morning, she needs constant encouragement and bedside assistance.  Will initiate calorie count.  Hopefully can avoid tube feeds.   Remains medically stable for transfer to inpatient psychiatric facility.  DVT prophylaxis: enoxaparin Code Status: full Family  Communication: none Disposition Plan: inpatient psych    [name], MD  [facility] Direct contact: [phone] --Via amion app OR  --[email or web address]; password [email or web address]  7PM-7AM contact night coverage as above 10/14/2017, 4:24 PM  LOS: 9 days   Consultants:  Psychiatry  Procedures:    Antimicrobials:    Interval history/Subjective: Anxious.  Objective: Vitals:  Vitals:   10/14/17 0552 10/14/17 0813  BP: 104/80 133/84  Pulse: (!) 106 (!) 105  Resp: 20 18  Temp: 98.2 F (36.8 C) 98.1 F (36.7 C)  SpO2: 98% 98%    Exam: Constitutional:   . Appears anxious Respiratory:  . CTA bilaterally, no w/r/r.  . Respiratory effort normal.  Cardiovascular:  . RRR, no m/r/g . No LE extremity edema   Abdomen:  . soft Psychiatric:  . Mental status o Mood, affect appropriate  I have personally reviewed the following:   Data: . CBG remains stable . BMP stable  Scheduled Meds: . enoxaparin (LOVENOX) injection  30 mg Subcutaneous Q24H  . feeding supplement (ENSURE ENLIVE)  237 mL Oral TID BM  . fluvoxaMINE  150 mg Oral QHS  . magnesium oxide  400 mg Oral BID  . megestrol  400 mg Oral Daily  . mirtazapine  45 mg Oral QHS  . multivitamin with minerals  1 tablet Oral Daily  . OLANZapine zydis  10 mg Oral QHS  . simvastatin  10 mg Oral q1800  . sodium chloride flush  3 mL Intravenous Q12H  . venlafaxine XR  37.5 mg Oral Q breakfast   Continuous Infusions: . dextrose 5 % and 0.45 % NaCl with KCl 20 mEq/L 125 mL/hr at 10/14/17 1442    Principal Problem:   MDD (major depressive disorder), recurrent severe, without psychosis (HCC) Active Problems:   MDD (major depressive disorder)   Anorexia   Dysphagia   Severe malnutrition (HCC)   LOS: 9  days

## 2017-10-14 NOTE — Consult Note (Signed)
[facility] Psych Consult Progress Note  10/14/2017 12:01 PM Amanda Hall  MRN:  [id number] Subjective:   Amanda Hall was last seen by the psychiatry consult service on 9/26 and was recommended for inpatient psychiatric hospitalization. Her home medications were continued. She was medically cleared on 9/30.   On interview, Amanda Hall reports "I could be worst" when asked how she is doing. She reports no improvement in her anxiety. She is fidgety throughout the interview and ruminates on changing positions in bed. She reports that she has been trying to eat. She reports, "so so" when asked about her sleep. She is reportedly eating 25% of her meals and is receiving Ensure supplementation. She is compliant with her medications. She denies SI, HI or AVH.    Principal Problem: MDD (major depressive disorder), recurrent severe, without psychosis ([place]) Diagnosis:   Patient Active Problem List   Diagnosis Date Noted  . Dysphagia [R13.10] 10/12/2017  . Severe malnutrition ([place]) [E43] 10/12/2017  . MDD (major depressive disorder), recurrent severe, without psychosis ([place]) [F33.2]   . Anorexia [R63.0] 10/05/2017  . Uremia [N19]   . Protein-calorie malnutrition, severe [E43] 08/29/2017  . Generalized anxiety disorder [F41.1] 05/26/2017  . MDD (major depressive disorder) [F32.9] 05/26/2017  . Hypercholesterolemia [E78.00] 03/22/2017  . Nonspecific (abnormal) findings on radiological and other examination of body structure [793] 12/26/2006  . NONSPECIFIC ABNORM FIND RAD&OTH EXAM LUNG FIELD [R93.0] 12/26/2006  . ANEMIA-IRON DEFICIENCY [D50.9] 12/15/2006  . Depression [F32.9] 12/15/2006  . Allergic rhinitis [J30.9] 12/15/2006   Total Time spent with patient: 15 minutes  Past Psychiatric History: MDD  Past Medical History:  Past Medical History:  Diagnosis Date  . Allergy   . Anemia   . Anxiety   . Depression   . Hemorrhoids     Past Surgical History:  Procedure Laterality Date  . arm surgery     Left   . TUBAL LIGATION     Family History:  Family History  Problem Relation Age of Onset  . Stroke Mother   . Hypertension Sister   . Cancer Neg Hx        breat and colon hx  . Diabetes Neg Hx        family   Family Psychiatric  History: Grandfather-depression and committed suicide.  Social History:  Social History   Substance and Sexual Activity  Alcohol Use No     Social History   Substance and Sexual Activity  Drug Use No    Social History   Socioeconomic History  . Marital status: Divorced    Spouse name: Not on file  . Number of children: Not on file  . Years of education: Not on file  . Highest education level: Not on file  Occupational History  . Not on file  Social Needs  . Financial resource strain: Not on file  . Food insecurity:    Worry: Not on file    Inability: Not on file  . Transportation needs:    Medical: Not on file    Non-medical: Not on file  Tobacco Use  . Smoking status: Never Smoker  . Smokeless tobacco: Never Used  Substance and Sexual Activity  . Alcohol use: No  . Drug use: No  . Sexual activity: Not Currently  Lifestyle  . Physical activity:    Days per week: Not on file    Minutes per session: Not on file  . Stress: Not on file  Relationships  . Social connections:  Talks on phone: Not on file    Gets together: Not on file    Attends religious service: Not on file    Active member of club or organization: Not on file    Attends meetings of clubs or organizations: Not on file    Relationship status: Not on file  Other Topics Concern  . Not on file  Social History Narrative  . Not on file    Sleep: Fair  Appetite:  Poor  Current Medications: Current Facility-Administered Medications  Medication Dose Route Frequency Provider Last Rate Last Dose  . acetaminophen (TYLENOL) tablet 650 mg  650 mg Oral Q6H PRN Samuella Cota, MD       Or  . acetaminophen (TYLENOL) suppository 650 mg  650 mg Rectal Q6H PRN Samuella Cota, MD      . dextrose 5 % and 0.45 % NaCl with KCl 20 mEq/L infusion   Intravenous Continuous Samuella Cota, MD 125 mL/hr at 10/14/17 [phone]    . enoxaparin (LOVENOX) injection 30 mg  30 mg Subcutaneous Q24H Samuella Cota, MD      . feeding supplement (ENSURE ENLIVE) (ENSURE ENLIVE) liquid 237 mL  237 mL Oral TID BM Samuella Cota, MD   237 mL at 10/13/17 0827  . fluvoxaMINE (LUVOX) tablet 150 mg  150 mg Oral QHS Samuella Cota, MD   150 mg at 10/13/17 2106  . magnesium oxide (MAG-OX) tablet 400 mg  400 mg Oral BID Samuella Cota, MD   400 mg at 10/14/17 0745  . megestrol (MEGACE) 400 MG/10ML suspension 400 mg  400 mg Oral Daily Samuella Cota, MD   400 mg at 10/14/17 0746  . mirtazapine (REMERON SOL-TAB) disintegrating tablet 45 mg  45 mg Oral QHS Samuella Cota, MD   45 mg at 10/13/17 2104  . multivitamin with minerals tablet 1 tablet  1 tablet Oral Daily Samuella Cota, MD   1 tablet at 10/14/17 0745  . naphazoline-glycerin (CLEAR EYES REDNESS) ophth solution 2 drop  2 drop Both Eyes QID PRN Samuella Cota, MD      . OLANZapine zydis (ZYPREXA) disintegrating tablet 10 mg  10 mg Oral QHS Samuella Cota, MD   10 mg at 10/13/17 2106  . ondansetron (ZOFRAN) tablet 4 mg  4 mg Oral Q6H PRN Samuella Cota, MD       Or  . ondansetron [facility]) injection 4 mg  4 mg Intravenous Q6H PRN Samuella Cota, MD      . simvastatin (ZOCOR) tablet 10 mg  10 mg Oral q1800 Samuella Cota, MD   10 mg at 10/13/17 1727  . sodium chloride flush (NS) 0.9 % injection 3 mL  3 mL Intravenous Q12H Samuella Cota, MD   Stopped at 10/10/17 2116  . venlafaxine XR (EFFEXOR-XR) 24 hr capsule 37.5 mg  37.5 mg Oral Q breakfast Samuella Cota, MD   37.5 mg at 10/14/17 3267    Lab Results:  Results for orders placed or performed during the hospital encounter of 10/05/17 (from the past 48 hour(s))  Glucose, capillary     Status: Abnormal   Collection Time: 10/12/17  12:07 PM  Result Value Ref Range   Glucose-Capillary 125 (H) 70 - 99 mg/dL   Comment 1 Notify RN   Glucose, capillary     Status: None   Collection Time: 10/12/17  5:08 PM  Result Value Ref Range   Glucose-Capillary 96  70 - 99 mg/dL  Glucose, capillary     Status: None   Collection Time: 10/13/17  7:59 AM  Result Value Ref Range   Glucose-Capillary 98 70 - 99 mg/dL  Glucose, capillary     Status: Abnormal   Collection Time: 10/13/17 11:36 AM  Result Value Ref Range   Glucose-Capillary 114 (H) 70 - 99 mg/dL  Glucose, capillary     Status: Abnormal   Collection Time: 10/13/17  4:21 PM  Result Value Ref Range   Glucose-Capillary 114 (H) 70 - 99 mg/dL  Glucose, capillary     Status: Abnormal   Collection Time: 10/14/17  8:15 AM  Result Value Ref Range   Glucose-Capillary 129 (H) 70 - 99 mg/dL  Basic metabolic panel     Status: Abnormal   Collection Time: 10/14/17  9:02 AM  Result Value Ref Range   Sodium 138 135 - 145 mmol/L   Potassium 3.7 3.5 - 5.1 mmol/L   Chloride 105 98 - 111 mmol/L   CO2 25 22 - 32 mmol/L   Glucose, Bld 118 (H) 70 - 99 mg/dL   BUN 14 8 - 23 mg/dL   Creatinine, Ser 0.75 0.44 - 1.00 mg/dL   Calcium 9.8 8.9 - 10.3 mg/dL   GFR calc non Af Amer >60 >60 mL/min   GFR calc Af Amer >60 >60 mL/min    Comment: (NOTE) The eGFR has been calculated using the CKD EPI equation. This calculation has not been validated in all clinical situations. eGFR's persistently <60 mL/min signify possible Chronic Kidney Disease.    Anion gap 8 5 - 15    Comment: Performed at [facility] [address], [place], [place] [address]  Magnesium     Status: None   Collection Time: 10/14/17  9:02 AM  Result Value Ref Range   Magnesium 2.2 1.7 - 2.4 mg/dL    Comment: Performed at [facility] [address], [place], [place] [address]  Phosphorus     Status: None   Collection Time: 10/14/17  9:02 AM  Result Value Ref Range   Phosphorus 2.6 2.5 - 4.6 mg/dL    Comment:  Performed at [facility] [address] [place], [place] [address]  Glucose, capillary     Status: Abnormal   Collection Time: 10/14/17 11:47 AM  Result Value Ref Range   Glucose-Capillary 158 (H) 70 - 99 mg/dL    Blood Alcohol level:  Lab Results  Component Value Date   ETH <10 06/12/2017   ETH <10 05/25/2017   Musculoskeletal: Strength & Muscle Tone: Generalized weakness Gait & Station: unable to stand Patient leans: N/A  Psychiatric Specialty Exam: Physical Exam  Nursing note and vitals reviewed. Constitutional: She is oriented to person, place, and time. She appears well-developed.  Thin   HENT:  Head: Normocephalic and atraumatic.  Neck: Normal range of motion.  Respiratory: Effort normal.  Musculoskeletal: Normal range of motion.  Neurological: She is alert and oriented to person, place, and time.  Psychiatric: Her speech is normal. Judgment and thought content normal. Her mood appears anxious. She is slowed. Cognition and memory are impaired. She exhibits a depressed mood.    Review of Systems  Cardiovascular: Negative for chest pain.  Gastrointestinal: Negative for abdominal pain, constipation, diarrhea, nausea and vomiting.  Psychiatric/Behavioral: Positive for depression. Negative for hallucinations, substance abuse and suicidal ideas. The patient is nervous/anxious. The patient does not have insomnia.     Blood  pressure 133/84, pulse (!) 105, temperature 98.1 F (36.7 C), temperature source Oral, resp. rate 18, height 5' 2"  (1.575 m), weight 42 kg, SpO2 98 %.Body mass index is 16.94 kg/m.  General Appearance: Fairly Groomed, thin, elderly, Caucasian female wearing a hospital gown and lying in bed on her left side. NAD.   Eye Contact:  Good  Speech:  Clear and Coherent and Normal Rate  Volume:  Normal  Mood:  Anxious  Affect:  Congruent and Constricted  Thought Process:  Goal Directed, Linear and Descriptions of Associations: Intact  Orientation:   Full (Time, Place, and Person)  Thought Content:  Logical  Suicidal Thoughts:  No  Homicidal Thoughts:  No  Memory:  Immediate;   Fair Recent;   Fair Remote;   Fair  Judgement:  Poor  Insight:  Shallow  Psychomotor Activity:  Decreased  Concentration:  Concentration: Fair and Attention Span: Fair  Recall:  [employer] of Knowledge:  Fair  Language:  Good  Akathisia:  No  Handed:  Right  AIMS (if indicated):   N/A  Assets:  Financial Resources/Insurance Housing Social Support  ADL's:  Impaired  Cognition:  Impaired due to psychiatric condition. She is appears ambivalent/indecisive when responding to questions.   Sleep:   Fair    Assessment:  Amanda Hall is a 67 y.o. female who was admitted with AKI secondary to poor PO intake. She is more alert today although she continues to appear extremely anxious with brief and ambivalent responses to questions. Her appetite remains poor and she requires total care from nursing. Recommending increasing Zyprexa for anxiety. She continues to warrant inpatient psychiatric hospitalization for stabilization and treatment.   Treatment Plan Summary: -Patient continues to warrant inpatient psychiatric hospitalization for severe depression and anxiety causing significant impairments in daily functioning with poor PO intake and severe malnutrition. -Continue bedside sitter.  -Continue home medications: Luvox 150 mg qhs for depression and anxiety, Remeron 45 mg qhs for depression and anxiety and Effexor 37.5 mg daily for depression and anxiety. -Increase Zyprexa 10 mg qhs to 12.5 mg qhs for anxiety and mood stabilization. Provide Zyprexa 2.5 mg daily PRN for anxiety.  -Patient may be a good candidate for ECT given refractory severe depression/anxiety.  -EKG reviewed and QTc 482 on 9/25. Please closely monitor when starting or increasing QTc prolonging agents.  -Please pursue involuntary commitment if patient refuses voluntary psychiatric hospitalization or  attempts to leave the hospital without viable safety plan.  -Will sign off on patient at this time. Please consult psychiatry again as needed.     Faythe Dingwall, DO 10/14/2017, 12:01 PM

## 2017-10-14 NOTE — Clinical Social Work Note (Signed)
CSW continuing to seek psychiatric placement for patient.  On 10/13/17: 1. [facility] - Denied patient due to anorexia 2. [facility] - Clinicals faxed again 3. [facility] - Per [employer], no beds today 4. [facility] - Per Joni Reining, patient denied as a patient 8/29 - 09/15/17 and they don't readmit 30         days after discharge from their facility. 5. [facility] - Denied as too many medical comorbities 7. [facility] - No beds available today 8.  - Cannot take patient   On 10/4: 1. [facility] - No female beds 2. [facility] - At capacity on [place] unit 3. [facility] - Message left for Intake staff person 4. [name] - Now [address] - Per [place], patient denied due to anorexia 5. [facility] - No one in Intake today, call back on Monday.  CSW will continue to seek placement and await updated psych evaluation for possible [facility] application.

## 2017-10-14 NOTE — Progress Notes (Signed)
Brief Nutrition Noted:   Noted Calorie Count ordered by MD. RD recommended Cortrak tube placement for nutrition and hydration on follow-up assessment yesterday. Recommendations discussed with MD yesterday  Per MD this AM, pt ate 25% of breakfast tray with assistance. Calorie count initiated today and will continue through the weekend. RD to follow-up next week to assess calorie count  Continue Regular diet, supplements and feeding assistance  [employer] MS, RD, LDN, CNSC [phone] Pager  [phone] Weekend/On-Call Pager

## 2017-10-15 LAB — GLUCOSE, CAPILLARY
GLUCOSE-CAPILLARY: 100 mg/dL — AB (ref 70–99)
GLUCOSE-CAPILLARY: 126 mg/dL — AB (ref 70–99)
GLUCOSE-CAPILLARY: 118 mg/dL — AB (ref 70–99)
Glucose-Capillary: 141 mg/dL — ABNORMAL HIGH (ref 70–99)

## 2017-10-15 MED ORDER — SENNA 8.6 MG PO TABS
1.0000 | ORAL_TABLET | Freq: Every day | ORAL | Status: DC
  Administered 2017-10-17 – 2017-10-18 (×2): 8.6 mg via ORAL
  Filled 2017-10-15 (×4): qty 1

## 2017-10-15 MED ORDER — POLYETHYLENE GLYCOL 3350 17 G PO PACK
17.0000 g | PACK | Freq: Two times a day (BID) | ORAL | Status: DC
  Administered 2017-10-15 – 2017-10-22 (×11): 17 g via ORAL
  Filled 2017-10-15 (×14): qty 1

## 2017-10-15 NOTE — Progress Notes (Signed)
Pt A+OX2(name/PLace), VSS, pt on RA sats are 97%, pt appears to be anxious, given prn Zyprexa, appears to be effective. pts appetite continues to be poor, ate ice cream with her meds this am. Calorie count is being collected and slips are in the folder outside the room. Pt denies pain, or sob at this time. Will CTM

## 2017-10-15 NOTE — Progress Notes (Signed)
  PROGRESS NOTE  Amanda Hall ZOX:[id number] DOB: 07-Jan-1951 DOA: 10/05/2017 PCP: [name], MD  Brief Narrative: 67 year old woman PMH depression, anxiety, recent inpatient psychiatric admission, presented with increased confusion, agitation, refusal to take medications, not eating, not drinking.  Admitted for acute kidney injury, acute encephalopathy.  Assessment/Plan Depression, anxiety --Remained stable for transfer to adult psychiatric facility.  Continue Zyprexa, Luvox, Remeron, Effexor per psychiatry.   Nutrition --Currently undergoing calorie count.  Needs assistance with all feeds and encouragement.  If unable to maintain nutrition, need to consider tube feeds.  Dysphagia. --Continue dysphagia 1 diet, thin liquids, medications crushed with pure  Severe malnutrition, underweight --Patient a 25% of breakfast this morning, she needs constant encouragement and bedside assistance.  Will initiate calorie count.  Hopefully can avoid tube feeds.  Acute kidney injury, dehydration, hypernatremia secondary to refusing to eat and drink secondary to underlying psychiatric disorder --Resolved with hydration.  Appetite, oral intake remains poor.  Acute encephalopathy with agitation, confusion on admission, secondary to anxiety, depression.  No evidence of infection.  Metabolic derangement corrected..  IVC paperwork completed in the emergency department.  Head CT negative for acute findings. --Continue supportive care, treatment as above.  SIRS.  Afebrile but had mild leukocytosis, tachycardia and elevated lactic acid on admission.  Urinalysis and chest x-ray without evidence of infection.  Thought secondary to dehydration. --Resolved.   Remains medically stable for transfer to psychiatric facility.  DVT prophylaxis: enoxaparin Code Status: full Family Communication: none Disposition Plan: inpatient psych    Amanda Sacks, MD  [facility] Direct contact:  [phone] --Via amion app OR  --[email or web address]; password [email or web address]  7PM-7AM contact night coverage as above 10/15/2017, 1:22 PM  LOS: 10 days   Consultants:  Psychiatry  Procedures:    Antimicrobials:    Interval history/Subjective: Ate a little for breakfast.  Objective: Vitals:  Vitals:   10/15/17 0602 10/15/17 0602  BP: 131/63   Pulse: (!) 38 87  Resp: 18   Temp: 98.8 F (37.1 C)   SpO2: 100% 100%    Exam: Constitutional:   . Appears anxious, withdrawn Respiratory:  . CTA bilaterally, no w/r/r.  . Respiratory effort normal. Cardiovascular:  . RRR, no m/r/g . No LE extremity edema   Abdomen:  . Soft  Psychiatric:  . Mental status . Odd affect, appears depressed  I have personally reviewed the following:   Data: I/O UOP: 2850 BM: not recorded CBG: stable, no lows   Scheduled Meds: . enoxaparin (LOVENOX) injection  30 mg Subcutaneous Q24H  . feeding supplement (ENSURE ENLIVE)  237 mL Oral TID BM  . fluvoxaMINE  150 mg Oral QHS  . magnesium oxide  400 mg Oral BID  . megestrol  400 mg Oral Daily  . mirtazapine  45 mg Oral QHS  . multivitamin with minerals  1 tablet Oral Daily  . OLANZapine zydis  12.5 mg Oral QHS  . simvastatin  10 mg Oral q1800  . sodium chloride flush  3 mL Intravenous Q12H  . venlafaxine XR  37.5 mg Oral Q breakfast   Continuous Infusions: . dextrose 5 % and 0.45 % NaCl with KCl 20 mEq/L 125 mL/hr at 10/15/17 5621    Principal Problem:   MDD (major depressive disorder), recurrent severe, without psychosis (HCC) Active Problems:   MDD (major depressive disorder)   Anorexia   Dysphagia   Severe malnutrition (HCC)   LOS: 10 days

## 2017-10-16 LAB — GLUCOSE, CAPILLARY
GLUCOSE-CAPILLARY: 91 mg/dL (ref 70–99)
GLUCOSE-CAPILLARY: 83 mg/dL (ref 70–99)
Glucose-Capillary: 90 mg/dL (ref 70–99)
Glucose-Capillary: 100 mg/dL — ABNORMAL HIGH (ref 70–99)

## 2017-10-16 NOTE — Progress Notes (Addendum)
  PROGRESS NOTE  Amanda Hall ZOX:[id number] DOB: August 28, 1950 DOA: 10/05/2017 PCP: [name], MD  Brief Narrative: 67 year old woman PMH depression, anxiety, recent inpatient psychiatric admission, presented with increased confusion, agitation, refusal to take medications, not eating, not drinking.  Admitted for acute kidney injury, acute encephalopathy.  Assessment/Plan Depression, anxiety --Severe symptoms, does not appear to be responding to Zyprexa, Luvox, Remeron, Effexor.    Severe malnutrition, underweight --Calorie count ongoing. Doubt if patient can maintain nutrition.  Will discuss with family, may need to consider tube feeds.  Dysphagia. --Continue dysphagia 1 diet, thin liquids, medications crushed with pure  Acute kidney injury, dehydration, hypernatremia secondary to refusing to eat and drink secondary to underlying psychiatric disorder --Resolved with hydration.    Acute encephalopathy with agitation, confusion on admission, secondary to anxiety, depression.  No evidence of infection.  Metabolic derangement corrected..  IVC paperwork completed in the emergency department.  Head CT negative for acute findings. --Continue supportive care and treatment as above.  SIRS.  Afebrile but had mild leukocytosis, tachycardia and elevated lactic acid on admission.  Urinalysis and chest x-ray without evidence of infection.  Thought secondary to dehydration. --Resolved.   Urgently needs psychiatric treatment, unfortunately no bed yet.  Will ask psychiatry to follow regularly to titrate medications.  DVT prophylaxis: enoxaparin Code Status: full Family Communication: none Disposition Plan: inpatient psych    [name], MD  [facility] Direct contact: [phone] --Via amion app OR  --[email or web address]; password [email or web address]  7PM-7AM contact night coverage as above 10/16/2017, 1:19 PM  LOS: 11 days    Consultants:  Psychiatry  Procedures:    Antimicrobials:    Interval history/Subjective: Very anxious.  Poor oral intake per tech. "Help me" Objective: Vitals:  Vitals:   10/16/17 0630 10/16/17 1000  BP: 134/74 121/70  Pulse: 88 82  Resp: 18 16  Temp: 98.9 F (37.2 C) 98.1 F (36.7 C)  SpO2: 98% 99%    Exam: Constitutional:   . Appears very anxious Respiratory:  . CTA bilaterally, no w/r/r.  . Respiratory effort normal.  Cardiovascular:  . RRR, no m/r/g . No LE extremity edema   Abdomen:  . soft Psychiatric:  . Mental status o Withdrawn, flat affect  I have personally reviewed the following:   Data: I/O UOP: 1500 CBG: Stable, no lows.   Scheduled Meds: . enoxaparin (LOVENOX) injection  30 mg Subcutaneous Q24H  . feeding supplement (ENSURE ENLIVE)  237 mL Oral TID BM  . fluvoxaMINE  150 mg Oral QHS  . magnesium oxide  400 mg Oral BID  . megestrol  400 mg Oral Daily  . mirtazapine  45 mg Oral QHS  . multivitamin with minerals  1 tablet Oral Daily  . OLANZapine zydis  12.5 mg Oral QHS  . polyethylene glycol  17 g Oral BID  . senna  1 tablet Oral QHS  . simvastatin  10 mg Oral q1800  . sodium chloride flush  3 mL Intravenous Q12H  . venlafaxine XR  37.5 mg Oral Q breakfast   Continuous Infusions:   Principal Problem:   MDD (major depressive disorder), recurrent severe, without psychosis (HCC) Active Problems:   MDD (major depressive disorder)   Anorexia   Dysphagia   Severe malnutrition (HCC)   LOS: 11 days    Time 35 minutes, >50% in care including discussion with sister by telephone, discussed prognosis and treatment options.

## 2017-10-17 DIAGNOSIS — F419 Anxiety disorder, unspecified: Secondary | ICD-10-CM

## 2017-10-17 LAB — BASIC METABOLIC PANEL
Anion gap: 6 (ref 5–15)
BUN: 25 mg/dL — ABNORMAL HIGH (ref 8–23)
CALCIUM: 9.9 mg/dL (ref 8.9–10.3)
CO2: 25 mmol/L (ref 22–32)
Chloride: 108 mmol/L (ref 98–111)
Creatinine, Ser: 0.9 mg/dL (ref 0.44–1.00)
GFR calc Af Amer: >60 mL/min (ref >60)
GFR calc non Af Amer: >60 mL/min (ref >60)
GLUCOSE: 81 mg/dL (ref 70–99)
Potassium: 3.5 mmol/L (ref 3.5–5.1)
Sodium: 139 mmol/L (ref 135–145)

## 2017-10-17 LAB — GLUCOSE, CAPILLARY
GLUCOSE-CAPILLARY: 74 mg/dL (ref 70–99)
GLUCOSE-CAPILLARY: 105 mg/dL — AB (ref 70–99)
Glucose-Capillary: 108 mg/dL — ABNORMAL HIGH (ref 70–99)
Glucose-Capillary: 82 mg/dL (ref 70–99)

## 2017-10-17 LAB — MAGNESIUM: Magnesium: 2.3 mg/dL (ref 1.7–2.4)

## 2017-10-17 LAB — PHOSPHORUS: Phosphorus: 2.9 mg/dL (ref 2.5–4.6)

## 2017-10-17 MED ORDER — OSMOLITE 1.2 CAL PO LIQD
1000.0000 mL | ORAL | Status: DC
  Administered 2017-10-17: 1000 mL
  Filled 2017-10-17 (×3): qty 1000

## 2017-10-17 MED ORDER — JEVITY 1.2 CAL PO LIQD: 1000.0000 mL | ORAL | Status: DC

## 2017-10-17 NOTE — Procedures (Addendum)
Cortrak  Person Inserting Tube:  Oluwaseyi Raffel, RD Tube Type:  Cortrak - 43 inches Tube Location:  Right nare Initial Placement:  Postpyloric Secured by: Bridle Technique Used to Measure Tube Placement:  Documented cm marking at nare/ corner of mouth Cortrak Secured At:  75 cm   No x-ray is required. RN may begin using tube.   If the tube becomes dislodged please keep the tube and contact the Cortrak team at [email or web address] (password [email or web address]) for replacement.  If after hours and replacement cannot be delayed, place a NG tube and confirm placement with an abdominal x-ray.    Falesha Schommer RD, LDN Clinical Nutrition Pager # - [phone]    

## 2017-10-17 NOTE — Progress Notes (Signed)
Nutrition Follow-up  DOCUMENTATION CODES:   Severe malnutrition in context of social or environmental circumstances, Underweight  INTERVENTION:   -Osmolite 1.2 @ 20 ml/hr via Cortrak  -Increase by 10 ml q8 hours to goal rate of 55 ml/hr (1320 ml)  At goal rate TF provides: 1584 kcals, 73 grams protein, 1082 ml free water. Meets 100% of needs.   NUTRITION DIAGNOSIS:   Severe Malnutrition related to social / environmental circumstances(severe major depressive disorder, refractory depression, anxiety) as evidenced by severe fat depletion, severe muscle depletion.   Ongoing   GOAL:   Patient will meet greater than or equal to 90% of their needs  Not meeting  MONITOR:   PO intake, Supplement acceptance, Labs, Weight trends  REASON FOR ASSESSMENT:   Malnutrition Screening Tool    ASSESSMENT:   67 yo female admitted with AKI, dehydration, anorexia with acute metabolic encephalopathy, FTT. Pt has been refusing to eat, drink or take medications. Pt has severe protein calorie malnutrition. Pt with recent inpatient psych hospitalization 1 month ago. Pt reports difficulty swallowing although pt's sister believes this to be subjective. Pt with MDD, severe, with psych consult. PMH includes anxiety, depression, malnutrition   Calorie Count results  9/5: Breakfast: bites Lunch: 290 kcal, 9 grams protein Dinner: 145 kcal, 5 grams protein Total: 435 kcal, 14 grams (meets 30% kcal needs and 19% protein needs)  9/6: Breakfast: 36 kcal, 1 grams protein Lunch: bites Dinner: 177 kcal, 4 grams protein Total: 213 kcal, 5 grams protein (meets 15% of kcal needs and 7% of protein needs)  Pt's intake remains minimal. She's consuming Magic Cups inconsistently, refusing Ensure, and eating bites at mealtimes. This RD placed post pyloric cortrak this am. Will advance TF slowly as pt is at risk for refeeding (ltyes wdl at this time). Continued to encourage PO intake and spoke with sitter regarding  feeding assistance. Weight noted to decrease from 92 lb on 10/4 to 89 lb today.  Calorie Count discontinued.   Medications reviewed and include: Mag-Ox, megace once daily, Remeron once daily, MVI with minerals, Miralax, Senokot  Labs reviewed: electroyltes WDL  Diet Order:   Diet Order            DIET - DYS 1 Room service appropriate? Yes; Fluid consistency: Thin  Diet effective now              EDUCATION NEEDS:   Not appropriate for education at this time  Skin:  Skin Assessment: Reviewed RN Assessment  Last BM:  10/15/17  Height:   Ht Readings from Last 1 Encounters:  10/05/17 5\' 2"  (1.575 m)    Weight:   Wt Readings from Last 1 Encounters:  10/17/17 40.7 kg    Ideal Body Weight:  50 kg  BMI:  Body mass index is 16.41 kg/m.  Estimated Nutritional Needs:   Kcal:  1430-1640 kcals   Protein:  72-82 g   Fluid:  >/= 1.4 L   Vanessa Kick RD, LDN Clinical Nutrition Pager # [phone]

## 2017-10-17 NOTE — Progress Notes (Signed)
PROGRESS NOTE  Amanda Hall WJX:[id number] DOB: 1950/02/06 DOA: 10/05/2017 PCP: [name], MD  Brief Narrative: 67 year old woman PMH depression, anxiety, recent inpatient psychiatric admission, presented with increased confusion, agitation, refusal to take medications, not eating, not drinking.  Admitted for acute kidney injury, acute encephalopathy.  Assessment/Plan Depression severe, anxiety --Thus far no significant response to Zyprexa, Luvox, Remeron, Effexor.  I have communicated with psychiatry with request for closer management, per [name], she will follow 1 time per week.   Severe malnutrition, underweight --Very poor oral intake.  Discussed in detail with the patient's sister by telephone, recommended temporary feeding tube placement, she concurred.  The patient has no power of attorney, her sister and her brother are involved in her care. --Initiate tube feeds.  Monitor for refeeding syndrome.  Check BMP, magnesium and phosphorus in the morning.  Dysphagia. --Continue dysphagia 1 diet, thin liquids, medications crushed with pure  Acute kidney injury, dehydration, hypernatremia secondary to refusing to eat and drink secondary to underlying psychiatric disorder --Resolved with hydration.    Acute encephalopathy with agitation, confusion on admission, secondary to anxiety, depression.  No evidence of infection.  Metabolic derangement corrected..  IVC paperwork completed in the emergency department.  Head CT negative for acute findings. --Continue care as above  SIRS.  Afebrile but had mild leukocytosis, tachycardia and elevated lactic acid on admission.  Urinalysis and chest x-ray without evidence of infection.  Thought secondary to dehydration. --Resolved.   Urgently requires inpatient psychiatric treatment.  Unfortunately no bed available as of yet.  Psychiatry to continue to follow.  Cortright tube placed, tube feeds started.  DVT prophylaxis: enoxaparin Code  Status: full Family Communication: none Disposition Plan: inpatient psych    [name], MD  [facility] Direct contact: [phone] --Via amion app OR  --[email or web address]; password [email or web address]  7PM-7AM contact night coverage as above 10/17/2017, 11:45 AM  LOS: 12 days   Consultants:  Psychiatry  Procedures:    Antimicrobials:    Interval history/Subjective: "I do not know what to do, I do not know if I can eat very much"  Objective: Vitals:  Vitals:   10/17/17 0422 10/17/17 0840  BP: 136/69 (!) 146/82  Pulse: 86 (!) 129  Resp: 17 18  Temp:  98.1 F (36.7 C)  SpO2: 100% 98%    Exam: Constitutional:   . Appears depressed, very anxious, nontoxic Respiratory:  . CTA bilaterally, no w/r/r.  . Respiratory effort normal.  Cardiovascular:  . RRR, no m/r/g . No LE extremity edema   Abdomen:  . Soft, nontender Psychiatric:  . Mental status . Awake and alert, answers simple questions.  Very anxious affect.  Withdrawn, depressed.  I have personally reviewed the following:   Data: I/O UOP: 500 CBG: Stable, no lows Labs: BMP unremarkable.  Phosphorus and magnesium within normal limits.   Scheduled Meds: . enoxaparin (LOVENOX) injection  30 mg Subcutaneous Q24H  . feeding supplement (ENSURE ENLIVE)  237 mL Oral TID BM  . fluvoxaMINE  150 mg Oral QHS  . magnesium oxide  400 mg Oral BID  . megestrol  400 mg Oral Daily  . mirtazapine  45 mg Oral QHS  . multivitamin with minerals  1 tablet Oral Daily  . OLANZapine zydis  12.5 mg Oral QHS  . polyethylene glycol  17 g Oral BID  . senna  1 tablet Oral QHS  . simvastatin  10 mg Oral q1800  . sodium chloride flush  3 mL Intravenous Q12H  .  venlafaxine XR  37.5 mg Oral Q breakfast   Continuous Infusions: . feeding supplement (OSMOLITE 1.2 CAL)      Principal Problem:   MDD (major depressive disorder), recurrent severe, without psychosis (HCC) Active Problems:   MDD (major depressive disorder)    Anorexia   Dysphagia   Severe malnutrition (HCC)   LOS: 12 days

## 2017-10-17 NOTE — Clinical Social Work Note (Signed)
IVC paperwork updated today and originals are in patient's chart. CSW continuing efforts to locate psychiatric placement for patient.  Genelle Bal, MSW, LCSW Licensed Clinical Social Worker Clinical Social Work Department [employer] [phone]

## 2017-10-18 ENCOUNTER — Other Ambulatory Visit: Payer: Self-pay

## 2017-10-18 LAB — BASIC METABOLIC PANEL
ANION GAP: 7 (ref 5–15)
BUN: 27 mg/dL — ABNORMAL HIGH (ref 8–23)
CALCIUM: 9.7 mg/dL (ref 8.9–10.3)
CO2: 27 mmol/L (ref 22–32)
Chloride: 107 mmol/L (ref 98–111)
Creatinine, Ser: 0.89 mg/dL (ref 0.44–1.00)
GLUCOSE: 134 mg/dL — AB (ref 70–99)
POTASSIUM: 3.5 mmol/L (ref 3.5–5.1)
SODIUM: 141 mmol/L (ref 135–145)

## 2017-10-18 LAB — GLUCOSE, CAPILLARY
GLUCOSE-CAPILLARY: 117 mg/dL — AB (ref 70–99)
GLUCOSE-CAPILLARY: 136 mg/dL — AB (ref 70–99)
GLUCOSE-CAPILLARY: 135 mg/dL — AB (ref 70–99)
Glucose-Capillary: 123 mg/dL — ABNORMAL HIGH (ref 70–99)

## 2017-10-18 LAB — MAGNESIUM: MAGNESIUM: 2.2 mg/dL (ref 1.7–2.4)

## 2017-10-18 LAB — PHOSPHORUS: Phosphorus: 2.7 mg/dL (ref 2.5–4.6)

## 2017-10-18 MED ORDER — OSMOLITE 1.2 CAL PO LIQD
1000.0000 mL | ORAL | Status: DC
  Administered 2017-10-18: 1000 mL
  Filled 2017-10-18: qty 1000

## 2017-10-18 MED ORDER — VENLAFAXINE HCL 37.5 MG PO TABS
37.5000 mg | ORAL_TABLET | Freq: Every day | ORAL | Status: DC
  Administered 2017-10-18 – 2017-10-20 (×3): 37.5 mg via ORAL
  Filled 2017-10-18 (×3): qty 1

## 2017-10-18 MED ORDER — BISACODYL 10 MG RE SUPP
10.0000 mg | Freq: Once | RECTAL | Status: DC
  Filled 2017-10-18: qty 1

## 2017-10-18 MED ORDER — OSMOLITE 1.2 CAL PO LIQD
1000.0000 mL | ORAL | Status: DC
  Administered 2017-10-18: 1000 mL
  Filled 2017-10-18 (×3): qty 1000

## 2017-10-18 NOTE — Evaluation (Signed)
Physical Therapy Evaluation Patient Details Name: Amanda Hall MRN: [id number] DOB: May 11, 1950 Today's Date: 10/18/2017   History of Present Illness  Pt is a 67 y/o female admitted secondary to major depressive disorder. Pt with increased confusion and agitation prior to admission. Found to have AKI and acute encephalopathy. CT negative for acute abnormality. PMH includes anxiety and depression.   Clinical Impression  Pt admitted secondary to problem above with deficits below. Pt very weak with very low muscle mass, and presenting with increased anxiety with mobility. Required max A to stand, however, unable to achieve full upright positioning. Following standing, pt reports wheezing, however, PT did not notice wheezing and oxygen sats WFL on RA. Per pt, she was ambulating with assist and use of RW, but unsure of accuracy. Will continue to follow acutely to maximize functional mobility independence and safety.     Follow Up Recommendations Other (comment);Supervision/Assistance - 24 hour(inpatient psych )    Equipment Recommendations  None recommended by PT    Recommendations for Other Services       Precautions / Restrictions Precautions Precautions: Fall;Other (comment) Precaution Comments: Safety sitter, safety mits, cortrack tube Restrictions Weight Bearing Restrictions: No      Mobility  Bed Mobility Overal bed mobility: Needs Assistance Bed Mobility: Supine to Sit;Sit to Supine     Supine to sit: Max assist Sit to supine: Total assist   General bed mobility comments: Max A for LE assist and trunk elevation. Increased time required and multimodal cues used for sequencing. total A to return to supine as pt very anxious and reported increased wheezing. No wheezing noted, however, checked oxygen sats and oxygen sats WFL. Educated pt to take deep breaths, however, pt stating "that won't help"  Transfers Overall transfer level: Needs assistance Equipment used: 1 person hand held  assist Transfers: Sit to/from Stand Sit to Stand: Max assist         General transfer comment: Heavy max A to stand for lift assist and steadying. Pt unable to acheive full upright posture and getting anxious with mobility, therefore returned to sitting. HR elevating to low 120s during transfer.   Ambulation/Gait             General Gait Details: unable  Stairs            Wheelchair Mobility    Modified Rankin (Stroke Patients Only)       Balance Overall balance assessment: Needs assistance Sitting-balance support: No upper extremity supported;Feet supported Sitting balance-Leahy Scale: Poor Sitting balance - Comments: Reliant on min to mod A to maintain sitting balance secondary to L lateral lean  Postural control: Left lateral lean Standing balance support: Bilateral upper extremity supported;During functional activity Standing balance-Leahy Scale: Poor Standing balance comment: Unable to achieve full upright and requiring max A for standing attempt.                             Pertinent Vitals/Pain Pain Assessment: No/denies pain    Home Living Family/patient expects to be discharged to:: Other (Comment)(inpatient psych)                      Prior Function Level of Independence: Needs assistance   Gait / Transfers Assistance Needed: Reports she needed assist with walking and sometimes used RW.            Hand Dominance   Dominant Hand: Right    Extremity/Trunk Assessment  Upper Extremity Assessment Upper Extremity Assessment: Defer to OT evaluation    Lower Extremity Assessment Lower Extremity Assessment: Generalized weakness;RLE deficits/detail;LLE deficits/detail RLE Deficits / Details: Very low muscle tone BLE. Able to perform ankle pumps and heel slides.  LLE Deficits / Details: Very low muscle tone BLE. Able to perform ankle pumps and heel slides.     Cervical / Trunk Assessment Cervical / Trunk Assessment: Other  exceptions Cervical / Trunk Exceptions: Pt with cervical rotation to the L secondary to muscular tightness. Unable to reposition.   Communication   Communication: No difficulties  Cognition Arousal/Alertness: Awake/alert Behavior During Therapy: Anxious Overall Cognitive Status: No family/caregiver present to determine baseline cognitive functioning                                 General Comments: Pt would sometimes respond to questions and other times would not. Pt very anxious at end of session. Reports she felt like she was wheezing, however, did not note wheezing. Oxygen sats WFL. Pt asking to take mits off and to take out feeding tube. Pt repeatedly stating "I can't take this"      General Comments General comments (skin integrity, edema, etc.): Sitter present throughout session     Exercises General Exercises - Lower Extremity Ankle Circles/Pumps: AROM;Both;10 reps Heel Slides: AAROM;Both;10 reps   Assessment/Plan    PT Assessment Patient needs continued PT services  PT Problem List Decreased strength;Decreased balance;Decreased activity tolerance;Decreased mobility;Decreased knowledge of use of DME;Decreased knowledge of precautions;Decreased safety awareness;Decreased range of motion       PT Treatment Interventions DME instruction;Functional mobility training;Gait training;Therapeutic exercise;Therapeutic activities;Balance training;Patient/family education    PT Goals (Current goals can be found in the Care Plan section)  Acute Rehab PT Goals PT Goal Formulation: Patient unable to participate in goal setting Time For Goal Achievement: 11/01/17 Potential to Achieve Goals: Fair    Frequency Min 2X/week   Barriers to discharge        Co-evaluation               AM-PAC PT "6 Clicks" Daily Activity  Outcome Measure Difficulty turning over in bed (including adjusting bedclothes, sheets and blankets)?: Unable Difficulty moving from lying on back  to sitting on the side of the bed? : Unable Difficulty sitting down on and standing up from a chair with arms (e.g., wheelchair, bedside commode, etc,.)?: Unable Help needed moving to and from a bed to chair (including a wheelchair)?: Total Help needed walking in hospital room?: Total Help needed climbing 3-5 steps with a railing? : Total 6 Click Score: 6    End of Session   Activity Tolerance: Other (comment)(limited secondary to anxiety ) Patient left: in bed;with call bell/phone within reach;with nursing/sitter in room Nurse Communication: Mobility status PT Visit Diagnosis: Unsteadiness on feet (R26.81);Muscle weakness (generalized) (M62.81)    Time: 1740-1800 PT Time Calculation (min) (ACUTE ONLY): 20 min   Charges:   PT Evaluation $PT Eval Moderate Complexity: 1 Mod          Gladys Damme, PT, DPT  Acute Rehabilitation Services  Pager: [phone] Office: [phone]   Le[facility] Prom 10/18/2017, 6:20 PM

## 2017-10-18 NOTE — Progress Notes (Signed)
PROGRESS NOTE  Amanda Hall ZOX:[id number] DOB: 05-Nov-1950 DOA: 10/05/2017 PCP: [name], MD  Brief Narrative: 67 year old woman PMH depression, anxiety, recent inpatient psychiatric admission, presented with increased confusion, agitation, refusal to take medications, not eating, not drinking.  Admitted for acute kidney injury, acute encephalopathy.  Assessment/Plan Depression severe, anxiety --Symptoms remain severe.  Continue Zyprexa, Luvox, Remeron and Effexor.  I communicated with psychiatry with request for closer management, per [name], she will follow 1 time per week.   Severe malnutrition, underweight --Oral intake was grossly inadequate.  Therefore at discussion with sister, Cortrak feeding tube placed.   --Monitor for refeeding syndrome.  Check BMP, magnesium and phosphorus in a.m.  Dysphagia. --Continue dysphagia 1 diet, thin liquids, medications crushed with pure  Acute kidney injury, dehydration, hypernatremia secondary to refusing to eat and drink secondary to underlying psychiatric disorder --Resolved with hydration.    Acute encephalopathy with agitation, confusion on admission, secondary to anxiety, depression.  No evidence of infection.  Metabolic derangement corrected..  IVC paperwork completed in the emergency department.  Head CT negative for acute findings. --Continue care as above  SIRS.  Afebrile but had mild leukocytosis, tachycardia and elevated lactic acid on admission.  Urinalysis and chest x-ray without evidence of infection.  Thought secondary to dehydration. --Resolved   Continues to require psychiatric inpatient treatment.  Continue tube feeds until oral intake improves.  Management per psychiatry.  DVT prophylaxis: enoxaparin Code Status: full Family Communication: none Disposition Plan: inpatient psych    [name], MD  [facility] Direct contact: [phone] --Via amion app OR  --[email or web address]; password [email or web address]    7PM-7AM contact night coverage as above 10/18/2017, 2:16 PM  LOS: 13 days   Consultants:  Psychiatry  Procedures:    Antimicrobials:    Interval history/Subjective: I do not know if I can eat very much  Objective: Vitals:  Vitals:   10/18/17 0551 10/18/17 0942  BP: (!) 149/85 (!) 147/78  Pulse: (!) 117 (!) 116  Resp: 18 18  Temp:  97.9 F (36.6 C)  SpO2: 97% 98%    Exam: Constitutional:   . Appears depressed, withdrawn, very anxious Respiratory:  . CTA bilaterally, no w/r/r.  . Respiratory effort normal Cardiovascular:  . RRR, no m/r/g . No LE extremity edema   Abdomen:  . Soft, nontender, nondistended Psychiatric:  . Mental status . As noted above   I have personally reviewed the following:   Data: I/O CBG: Remains stable.  No lows.   Labs: BMP unremarkable.  Phosphorus and magnesium within normal limits.  Scheduled Meds: . bisacodyl  10 mg Rectal Once  . enoxaparin (LOVENOX) injection  30 mg Subcutaneous Q24H  . feeding supplement (ENSURE ENLIVE)  237 mL Oral TID BM  . fluvoxaMINE  150 mg Oral QHS  . magnesium oxide  400 mg Oral BID  . megestrol  400 mg Oral Daily  . mirtazapine  45 mg Oral QHS  . multivitamin with minerals  1 tablet Oral Daily  . OLANZapine zydis  12.5 mg Oral QHS  . polyethylene glycol  17 g Oral BID  . senna  1 tablet Oral QHS  . simvastatin  10 mg Oral q1800  . sodium chloride flush  3 mL Intravenous Q12H  . venlafaxine  37.5 mg Oral Daily   Continuous Infusions: . feeding supplement (OSMOLITE 1.2 CAL) 1,000 mL (10/17/17 1633)    Principal Problem:   MDD (major depressive disorder), recurrent severe, without psychosis (HCC) Active Problems:  MDD (major depressive disorder)   Anorexia   Dysphagia   Severe malnutrition (HCC)   Anxiety   LOS: 13 days

## 2017-10-19 LAB — BASIC METABOLIC PANEL
Anion gap: 5 (ref 5–15)
BUN: 27 mg/dL — AB (ref 8–23)
CALCIUM: 9.8 mg/dL (ref 8.9–10.3)
CHLORIDE: 103 mmol/L (ref 98–111)
CO2: 30 mmol/L (ref 22–32)
CREATININE: 0.81 mg/dL (ref 0.44–1.00)
GFR calc Af Amer: >60 mL/min (ref >60)
GFR calc non Af Amer: >60 mL/min (ref >60)
Glucose, Bld: 132 mg/dL — ABNORMAL HIGH (ref 70–99)
Potassium: 3.6 mmol/L (ref 3.5–5.1)
SODIUM: 138 mmol/L (ref 135–145)

## 2017-10-19 LAB — GLUCOSE, CAPILLARY
GLUCOSE-CAPILLARY: 128 mg/dL — AB (ref 70–99)
GLUCOSE-CAPILLARY: 116 mg/dL — AB (ref 70–99)
Glucose-Capillary: 135 mg/dL — ABNORMAL HIGH (ref 70–99)
Glucose-Capillary: 132 mg/dL — ABNORMAL HIGH (ref 70–99)

## 2017-10-19 LAB — PHOSPHORUS: Phosphorus: 2.2 mg/dL — ABNORMAL LOW (ref 2.5–4.6)

## 2017-10-19 LAB — MAGNESIUM: MAGNESIUM: 2.3 mg/dL (ref 1.7–2.4)

## 2017-10-19 MED ORDER — OSMOLITE 1.2 CAL PO LIQD
1000.0000 mL | ORAL | Status: DC
  Administered 2017-10-19 – 2017-11-25 (×35): 1000 mL
  Filled 2017-10-19 (×57): qty 1000

## 2017-10-19 MED ORDER — ADULT MULTIVITAMIN LIQUID CH
15.0000 mL | Freq: Every day | ORAL | Status: DC
  Administered 2017-10-20 – 2017-10-26 (×7): 15 mL via ORAL
  Filled 2017-10-19 (×7): qty 15

## 2017-10-19 MED ORDER — K PHOS MONO-SOD PHOS DI & MONO 155-852-130 MG PO TABS
500.0000 mg | ORAL_TABLET | Freq: Two times a day (BID) | ORAL | Status: DC
  Administered 2017-10-19: 500 mg via ORAL
  Filled 2017-10-19 (×2): qty 2

## 2017-10-19 MED ORDER — POTASSIUM & SODIUM PHOSPHATES 280-160-250 MG PO PACK
2.0000 | PACK | Freq: Two times a day (BID) | ORAL | Status: DC
  Administered 2017-10-19 – 2017-10-24 (×10): 2 via ORAL
  Filled 2017-10-19 (×10): qty 2

## 2017-10-19 MED ORDER — SENNOSIDES 8.8 MG/5ML PO SYRP
5.0000 mL | ORAL_SOLUTION | Freq: Every day | ORAL | Status: DC
  Administered 2017-10-19 – 2017-10-22 (×4): 5 mL via ORAL
  Filled 2017-10-19 (×5): qty 5

## 2017-10-19 NOTE — Progress Notes (Signed)
Patient became very anxious about oral care and sublingual medication was not able to give the olanzapine 12.5 mg Ilean Skill LPN

## 2017-10-19 NOTE — Progress Notes (Addendum)
PROGRESS NOTE    Amanda Hall  ZOX:[id number] DOB: May 12, 1950 DOA: 10/05/2017 PCP: [name], MD    Brief Narrative:  67 year old woman PMH depression, anxiety, recent inpatient psychiatric admission, presented with increased confusion, agitation, refusal to take medications, not eating, not drinking.  Admitted for acute kidney injury, acute encephalopathy.   Assessment & Plan:   Principal Problem:   MDD (major depressive disorder), recurrent severe, without psychosis (HCC) Active Problems:   MDD (major depressive disorder)   Anorexia   Dysphagia   Severe malnutrition (HCC)   Anxiety  Depression severe, anxiety;  Continue with Zyprexa, luvox, Remeron and effexor.  Dr Nelva Bush will follow on patient once a week.  She has had levels check for TSH, HIV, Ammonia level within normal limits.   Severe malnutrition; underweight.  cortrak was placed after discussion with sister.  On tube feeding. Started 10-07 Monitor for refeeding syndrome.  Replete phosphorus.  On remeron.  Check B 12 and vitamin d.   Dysphagia. --Continue dysphagia 1 diet, thin liquids, medications crushed with pure  Acute kidney injury, dehydration, hypernatremia secondary to refusing to eat and drink secondary to underlying psychiatric disorder --Resolved with hydration.    Acute encephalopathy with agitation, confusion on admission, secondary to anxiety, depression.  No evidence of infection.  Metabolic derangement corrected..  IVC paperwork completed in the emergency department.  Head CT negative for acute findings. --Continue care as above  SIRS.  Afebrile but had mild leukocytosis, tachycardia and elevated lactic acid on admission.  Urinalysis and chest x-ray without evidence of infection.  Thought secondary to dehydration. --Resolved   DVT prophylaxis: lovenox Code Status: full code.  Family Communication:  No family at bedside.  Disposition Plan: awaiting psychiatric facility    Consultants:   Psych     Procedures:  none  Antimicrobials:   none   Subjective: lynig in bed, cachetic.  Denies pain. Feels depress.   Objective: Vitals:   10/18/17 0942 10/18/17 1752 10/18/17 2129 10/19/17 0737  BP: (!) 147/78 127/82 123/70 128/67  Pulse: (!) 116 (!) 122 98 93  Resp: 18 16  18   Temp: 97.9 F (36.6 C) 98.4 F (36.9 C)  97.7 F (36.5 C)  TempSrc: Oral Oral  Oral  SpO2: 98% 97% 98% 99%  Weight:      Height:        Intake/Output Summary (Last 24 hours) at 10/19/2017 1634 Last data filed at 10/19/2017 1513 Gross per 24 hour  Intake 943.34 ml  Output 600 ml  Net 343.34 ml   Filed Weights   10/16/17 2038 10/17/17 0358 10/18/17 0603  Weight: 41.4 kg 40.7 kg 41.1 kg    Examination:  General exam: cachetic. Flat affect  Respiratory system: Clear to auscultation. Respiratory effort normal. Cardiovascular system: S1 & S2 heard, RRR. No JVD, murmurs, rubs, gallops or clicks. No pedal edema. Gastrointestinal system: Abdomen is nondistended, soft and nontender. No organomegaly or masses felt. Normal bowel sounds heard. Central nervous system: Alert and oriented.  Extremities moves extremities Skin: No rashes, lesions or ulcers Psychiatry: flat affect.   Data Reviewed: I have personally reviewed following labs and imaging studies  CBC: No results for input(s): WBC, NEUTROABS, HGB, HCT, MCV, PLT in the last 168 hours. Basic Metabolic Panel: Recent Labs  Lab 10/14/17 0902 10/17/17 0355 10/18/17 0352 10/19/17 0439  NA 138 139 141 138  K 3.7 3.5 3.5 3.6  CL 105 108 107 103  CO2 25 25 27 30   GLUCOSE 118* 81  134* 132*  BUN 14 25* 27* 27*  CREATININE 0.75 0.90 0.89 0.81  CALCIUM 9.8 9.9 9.7 9.8  MG 2.2 2.3 2.2 2.3  PHOS 2.6 2.9 2.7 2.2*   GFR: Estimated Creatinine Clearance: 44.3 mL/min (by C-G formula based on SCr of 0.81 mg/dL). Liver Function Tests: No results for input(s): AST, ALT, ALKPHOS, BILITOT, PROT, ALBUMIN in the last 168  hours. No results for input(s): LIPASE, AMYLASE in the last 168 hours. No results for input(s): AMMONIA in the last 168 hours. Coagulation Profile: No results for input(s): INR, PROTIME in the last 168 hours. Cardiac Enzymes: No results for input(s): CKTOTAL, CKMB, CKMBINDEX, TROPONINI in the last 168 hours. BNP (last 3 results) No results for input(s): PROBNP in the last 8760 hours. HbA1C: No results for input(s): HGBA1C in the last 72 hours. CBG: Recent Labs  Lab 10/18/17 1139 10/18/17 1651 10/18/17 2200 10/19/17 0809 10/19/17 1159  GLUCAP 136* 135* 123* 135* 128*   Lipid Profile: No results for input(s): CHOL, HDL, LDLCALC, TRIG, CHOLHDL, LDLDIRECT in the last 72 hours. Thyroid Function Tests: No results for input(s): TSH, T4TOTAL, FREET4, T3FREE, THYROIDAB in the last 72 hours. Anemia Panel: No results for input(s): VITAMINB12, FOLATE, FERRITIN, TIBC, IRON, RETICCTPCT in the last 72 hours. Sepsis Labs: No results for input(s): PROCALCITON, LATICACIDVEN in the last 168 hours.  No results found for this or any previous visit (from the past 240 hour(s)).       Radiology Studies: No results found.      Scheduled Meds: . bisacodyl  10 mg Rectal Once  . enoxaparin (LOVENOX) injection  30 mg Subcutaneous Q24H  . feeding supplement (ENSURE ENLIVE)  237 mL Oral TID BM  . fluvoxaMINE  150 mg Oral QHS  . magnesium oxide  400 mg Oral BID  . megestrol  400 mg Oral Daily  . mirtazapine  45 mg Oral QHS  . multivitamin with minerals  1 tablet Oral Daily  . OLANZapine zydis  12.5 mg Oral QHS  . phosphorus  500 mg Oral BID  . polyethylene glycol  17 g Oral BID  . senna  1 tablet Oral QHS  . simvastatin  10 mg Oral q1800  . sodium chloride flush  3 mL Intravenous Q12H  . venlafaxine  37.5 mg Oral Daily   Continuous Infusions: . feeding supplement (OSMOLITE 1.2 CAL) 1,000 mL (10/19/17 1513)     LOS: 14 days    Time spent: 35 minutes.     Alba Cory,  MD [facility] Pager [phone]  If 7PM-7AM, please contact night-coverage [email or web address] Password [facility] 10/19/2017, 4:34 PM

## 2017-10-19 NOTE — Evaluation (Signed)
Occupational Therapy Evaluation Patient Details Name: Amanda Hall MRN: [id number] DOB: 06-Apr-1950 Today's Date: 10/19/2017    History of Present Illness Pt is a 67 y/o female admitted secondary to major depressive disorder. Pt with increased confusion and agitation prior to admission. Found to have AKI and acute encephalopathy. CT negative for acute abnormality. PMH includes anxiety and depression.    Clinical Impression   Pt presents with anxiety, generalized weakness and is tremulous. She maintains her head rotated to the L. Pt requires total assist for ADL and bed level mobility. Will follow acutely.    Follow Up Recommendations  Supervision/Assistance - 24 hour(Inpatient psych vs SNF)    Equipment Recommendations       Recommendations for Other Services       Precautions / Restrictions Precautions Precautions: Fall;Other (comment) Precaution Comments: Safety sitter, safety mits, cortrack tube Restrictions Weight Bearing Restrictions: No      Mobility Bed Mobility               General bed mobility comments: Total assist for positioning.  Transfers                 General transfer comment: deferred due to increasing anxiety and agitation with bed level movement    Balance                                           ADL either performed or assessed with clinical judgement   ADL                                         General ADL Comments: Currently requiring total care.     Vision Baseline Vision/History: Wears glasses       Perception     Praxis      Pertinent Vitals/Pain Pain Assessment: Faces Faces Pain Scale: No hurt     Hand Dominance Right   Extremity/Trunk Assessment Upper Extremity Assessment Upper Extremity Assessment: Generalized weakness(tremulous)   Lower Extremity Assessment Lower Extremity Assessment: Defer to PT evaluation   Cervical / Trunk Assessment Cervical / Trunk Assessment:  Other exceptions Cervical / Trunk Exceptions: Pt with cervical rotation to the L secondary to muscular tightness. Unable to reposition.    Communication Communication Communication: No difficulties   Cognition Arousal/Alertness: Awake/alert Behavior During Therapy: Anxious;Restless Overall Cognitive Status: No family/caregiver present to determine baseline cognitive functioning                                 General Comments: pt with inconsistent response to questions or directions   General Comments       Exercises     Shoulder Instructions      Home Living Family/patient expects to be discharged to:: Unsure                                        Prior Functioning/Environment Level of Independence: Needs assistance  Gait / Transfers Assistance Needed: Reports she needed assist with walking and sometimes used RW.  ADL's / Homemaking Assistance Needed: pt unable to report  OT Problem List: Decreased strength;Decreased activity tolerance;Impaired balance (sitting and/or standing);Decreased coordination;Decreased cognition;Impaired UE functional use      OT Treatment/Interventions: Self-care/ADL training;DME and/or AE instruction;Patient/family education;Balance training;Therapeutic activities;Cognitive remediation/compensation    OT Goals(Current goals can be found in the care plan section) Acute Rehab OT Goals Patient Stated Goal: did not state OT Goal Formulation: Patient unable to participate in goal setting Time For Goal Achievement: 11/02/17 Potential to Achieve Goals: Fair  OT Frequency: Min 2X/week   Barriers to D/C:            Co-evaluation              AM-PAC PT "6 Clicks" Daily Activity     Outcome Measure Help from another person eating meals?: Total Help from another person taking care of personal grooming?: Total Help from another person toileting, which includes using toliet, bedpan, or urinal?:  Total Help from another person bathing (including washing, rinsing, drying)?: Total Help from another person to put on and taking off regular upper body clothing?: Total Help from another person to put on and taking off regular lower body clothing?: Total 6 Click Score: 6   End of Session    Activity Tolerance: Treatment limited secondary to agitation(and anxiety) Patient left: in bed;with call bell/phone within reach;with bed alarm set;with nursing/sitter in room  OT Visit Diagnosis: Muscle weakness (generalized) (M62.81)                Time: 1610-9604 OT Time Calculation (min): 26 min Charges:  OT General Charges $OT Visit: 1 Visit OT Evaluation $OT Eval Moderate Complexity: 1 Mod OT Treatments $Therapeutic Activity: 8-22 mins  Evern Bio 10/19/2017, 3:26 PM  Martie Round, OTR/L Acute Rehabilitation Services Pager: [phone] Office: [phone]

## 2017-10-20 LAB — BASIC METABOLIC PANEL
Anion gap: 6 (ref 5–15)
BUN: 26 mg/dL — ABNORMAL HIGH (ref 8–23)
CHLORIDE: 104 mmol/L (ref 98–111)
CO2: 30 mmol/L (ref 22–32)
Calcium: 9.6 mg/dL (ref 8.9–10.3)
Creatinine, Ser: 0.83 mg/dL (ref 0.44–1.00)
GFR calc non Af Amer: >60 mL/min (ref >60)
Glucose, Bld: 93 mg/dL (ref 70–99)
POTASSIUM: 4 mmol/L (ref 3.5–5.1)
Sodium: 140 mmol/L (ref 135–145)

## 2017-10-20 LAB — GLUCOSE, CAPILLARY
GLUCOSE-CAPILLARY: 91 mg/dL (ref 70–99)
GLUCOSE-CAPILLARY: 102 mg/dL — AB (ref 70–99)
GLUCOSE-CAPILLARY: 121 mg/dL — AB (ref 70–99)
Glucose-Capillary: 84 mg/dL (ref 70–99)

## 2017-10-20 LAB — CBC
HEMATOCRIT: 40.9 % (ref 36.0–46.0)
HEMOGLOBIN: 13.1 g/dL (ref 12.0–15.0)
MCH: 29.4 pg (ref 26.0–34.0)
MCHC: 32 g/dL (ref 30.0–36.0)
MCV: 91.7 fL (ref 80.0–100.0)
NRBC: 0 % (ref 0.0–0.2)
Platelets: 288 10*3/uL (ref 150–400)
RBC: 4.46 MIL/uL (ref 3.87–5.11)
RDW: 14 % (ref 11.5–15.5)
WBC: 6.2 10*3/uL (ref 4.0–10.5)

## 2017-10-20 LAB — VITAMIN B12: Vitamin B-12: 438 pg/mL (ref 180–914)

## 2017-10-20 LAB — PHOSPHORUS: Phosphorus: 2.5 mg/dL (ref 2.5–4.6)

## 2017-10-20 LAB — MAGNESIUM: MAGNESIUM: 2.2 mg/dL (ref 1.7–2.4)

## 2017-10-20 MED ORDER — OLANZAPINE 5 MG PO TBDP
10.0000 mg | ORAL_TABLET | Freq: Every day | ORAL | Status: DC
  Administered 2017-10-20 – 2017-10-22 (×3): 10 mg via ORAL
  Filled 2017-10-20 (×4): qty 2

## 2017-10-20 MED ORDER — WHITE PETROLATUM EX OINT
TOPICAL_OINTMENT | CUTANEOUS | Status: AC
  Administered 2017-10-20: 0.2
  Filled 2017-10-20: qty 28.35

## 2017-10-20 MED ORDER — LORAZEPAM 2 MG/ML IJ SOLN
0.5000 mg | Freq: Once | INTRAMUSCULAR | Status: AC
  Administered 2017-10-20: 0.5 mg via INTRAVENOUS
  Filled 2017-10-20: qty 1

## 2017-10-20 MED ORDER — LITHIUM CARBONATE ER 300 MG PO TBCR
300.0000 mg | EXTENDED_RELEASE_TABLET | Freq: Every day | ORAL | Status: DC
  Administered 2017-10-20 – 2017-10-26 (×7): 300 mg via ORAL
  Filled 2017-10-20 (×7): qty 1

## 2017-10-20 MED ORDER — MAGNESIUM OXIDE 400 (241.3 MG) MG PO TABS
400.0000 mg | ORAL_TABLET | Freq: Two times a day (BID) | ORAL | Status: DC
  Administered 2017-10-20 – 2017-12-27 (×136): 400 mg
  Filled 2017-10-20 (×136): qty 1

## 2017-10-20 NOTE — Consult Note (Addendum)
[facility] Psych Consult Progress Note  10/20/2017 11:57 AM Amanda Hall  MRN:  [id number] Subjective:   Amanda Hall was last seen on 10/4 by the psychiatry consult service and Zyprexa was increased to 12.5 mg qhs for anxiety and mood stabilization. She was also started on Zyprexa 2.5 mg daily PRN for anxiety. She has been difficult to place due to needing cortrak for feeds.    On interview, Amanda Hall is found to be asleep although she easily awakens to verbal stimuli. She reports that she cannot believe that she is still sleeping. She would like to "get herself together." She does not believe that her anxiety has improved since her medications were last adjusted. She denies SI, HI or AVH. She reports sleeping overnight. She was encouraged to wake up and eat her lunch. She was educated about sleep hygiene and the importance of not sleeping too long in the daytime which can prevent her from sleeping at night.   Principal Problem: MDD (major depressive disorder), recurrent severe, without psychosis ([place]) Diagnosis:   Patient Active Problem List   Diagnosis Date Noted  . Anxiety [F41.9] 10/17/2017  . Dysphagia [R13.10] 10/12/2017  . Severe malnutrition ([place]) [E43] 10/12/2017  . MDD (major depressive disorder), recurrent severe, without psychosis ([place]) [F33.2]   . Anorexia [R63.0] 10/05/2017  . Uremia [N19]   . Protein-calorie malnutrition, severe [E43] 08/29/2017  . Generalized anxiety disorder [F41.1] 05/26/2017  . MDD (major depressive disorder) [F32.9] 05/26/2017  . Hypercholesterolemia [E78.00] 03/22/2017  . Nonspecific (abnormal) findings on radiological and other examination of body structure [793] 12/26/2006  . NONSPECIFIC ABNORM FIND RAD&OTH EXAM LUNG FIELD [R93.0] 12/26/2006  . ANEMIA-IRON DEFICIENCY [D50.9] 12/15/2006  . Depression [F32.9] 12/15/2006  . Allergic rhinitis [J30.9] 12/15/2006   Total Time spent with patient: 30 minutes  Past Psychiatric History: MDD  Past Medical History:   Past Medical History:  Diagnosis Date  . Allergy   . Anemia   . Anxiety   . Depression   . Hemorrhoids     Past Surgical History:  Procedure Laterality Date  . arm surgery     Left  . TUBAL LIGATION     Family History:  Family History  Problem Relation Age of Onset  . Stroke Mother   . Hypertension Sister   . Cancer Neg Hx        breat and colon hx  . Diabetes Neg Hx        family   Family Psychiatric  History: Grandfather-depression and committed suicide.  Social History:  Social History   Substance and Sexual Activity  Alcohol Use No     Social History   Substance and Sexual Activity  Drug Use No    Social History   Socioeconomic History  . Marital status: Divorced    Spouse name: Not on file  . Number of children: Not on file  . Years of education: Not on file  . Highest education level: Not on file  Occupational History  . Not on file  Social Needs  . Financial resource strain: Not on file  . Food insecurity:    Worry: Not on file    Inability: Not on file  . Transportation needs:    Medical: Not on file    Non-medical: Not on file  Tobacco Use  . Smoking status: Never Smoker  . Smokeless tobacco: Never Used  Substance and Sexual Activity  . Alcohol use: No  . Drug use: No  . Sexual activity: Not  Currently  Lifestyle  . Physical activity:    Days per week: Not on file    Minutes per session: Not on file  . Stress: Not on file  Relationships  . Social connections:    Talks on phone: Not on file    Gets together: Not on file    Attends religious service: Not on file    Active member of club or organization: Not on file    Attends meetings of clubs or organizations: Not on file    Relationship status: Not on file  Other Topics Concern  . Not on file  Social History Narrative  . Not on file    Sleep: Good  Appetite:  Poor  Current Medications: Current Facility-Administered Medications  Medication Dose Route Frequency Provider  Last Rate Last Dose  . acetaminophen (TYLENOL) tablet 650 mg  650 mg Oral Q6H PRN Samuella Cota, MD       Or  . acetaminophen (TYLENOL) suppository 650 mg  650 mg Rectal Q6H PRN Samuella Cota, MD      . enoxaparin (LOVENOX) injection 30 mg  30 mg Subcutaneous Q24H Samuella Cota, MD   30 mg at 10/20/17 4481  . feeding supplement (ENSURE ENLIVE) (ENSURE ENLIVE) liquid 237 mL  237 mL Oral TID BM Samuella Cota, MD   237 mL at 10/18/17 2133  . feeding supplement (OSMOLITE 1.2 CAL) liquid 1,000 mL  1,000 mL Per Tube Continuous Regalado, Belkys A, MD 55 mL/hr at 10/19/17 1513 1,000 mL at 10/19/17 1513  . fluvoxaMINE (LUVOX) tablet 150 mg  150 mg Oral QHS Samuella Cota, MD   150 mg at 10/19/17 2338  . magnesium oxide (MAG-OX) tablet 400 mg  400 mg Per Tube BID Regalado, Belkys A, MD      . megestrol (MEGACE) 400 MG/10ML suspension 400 mg  400 mg Oral Daily Samuella Cota, MD   400 mg at 10/20/17 8563  . mirtazapine (REMERON SOL-TAB) disintegrating tablet 45 mg  45 mg Oral QHS Samuella Cota, MD   45 mg at 10/19/17 2323  . multivitamin liquid 15 mL  15 mL Oral Daily Skeet Simmer, RPH   15 mL at 10/20/17 1497  . naphazoline-glycerin (CLEAR EYES REDNESS) ophth solution 2 drop  2 drop Both Eyes QID PRN Samuella Cota, MD      . OLANZapine [facility]) tablet 2.5 mg  2.5 mg Oral Daily PRN Samuella Cota, MD   2.5 mg at 10/16/17 1102  . OLANZapine zydis (ZYPREXA) disintegrating tablet 12.5 mg  12.5 mg Oral QHS Samuella Cota, MD   12.5 mg at 10/18/17 2132  . ondansetron (ZOFRAN) tablet 4 mg  4 mg Oral Q6H PRN Samuella Cota, MD       Or  . ondansetron [facility]) injection 4 mg  4 mg Intravenous Q6H PRN Samuella Cota, MD      . polyethylene glycol (MIRALAX / GLYCOLAX) packet 17 g  17 g Oral BID Samuella Cota, MD   17 g at 10/20/17 0263  . potassium & sodium phosphates (PHOS-NAK) 280-160-250 MG packet 2 packet  2 packet Oral BID Skeet Simmer, [facility]   2  packet at 10/20/17 7858  . sennosides (SENOKOT) 8.8 MG/5ML syrup 5 mL  5 mL Oral QHS Skeet Simmer, RPH   5 mL at 10/19/17 2340  . simvastatin (ZOCOR) tablet 10 mg  10 mg Oral q1800 Samuella Cota, MD   10  mg at 10/19/17 1722  . sodium chloride flush (NS) 0.9 % injection 3 mL  3 mL Intravenous Q12H Samuella Cota, MD   3 mL at 10/20/17 0825  . venlafaxine (EFFEXOR) tablet 37.5 mg  37.5 mg Oral Daily Joselyn Glassman A, RPH   37.5 mg at 10/20/17 2703    Lab Results:  Results for orders placed or performed during the hospital encounter of 10/05/17 (from the past 48 hour(s))  Glucose, capillary     Status: Abnormal   Collection Time: 10/18/17  4:51 PM  Result Value Ref Range   Glucose-Capillary 135 (H) 70 - 99 mg/dL  Glucose, capillary     Status: Abnormal   Collection Time: 10/18/17 10:00 PM  Result Value Ref Range   Glucose-Capillary 123 (H) 70 - 99 mg/dL  Basic metabolic panel     Status: Abnormal   Collection Time: 10/19/17  4:39 AM  Result Value Ref Range   Sodium 138 135 - 145 mmol/L   Potassium 3.6 3.5 - 5.1 mmol/L   Chloride 103 98 - 111 mmol/L   CO2 30 22 - 32 mmol/L   Glucose, Bld 132 (H) 70 - 99 mg/dL   BUN 27 (H) 8 - 23 mg/dL   Creatinine, Ser 0.81 0.44 - 1.00 mg/dL   Calcium 9.8 8.9 - 10.3 mg/dL   GFR calc non Af Amer >60 >60 mL/min   GFR calc Af Amer >60 >60 mL/min    Comment: (NOTE) The eGFR has been calculated using the CKD EPI equation. This calculation has not been validated in all clinical situations. eGFR's persistently <60 mL/min signify possible Chronic Kidney Disease.    Anion gap 5 5 - 15    Comment: Performed at [facility] [address] [place], [place] [address]  Magnesium     Status: None   Collection Time: 10/19/17  4:39 AM  Result Value Ref Range   Magnesium 2.3 1.7 - 2.4 mg/dL    Comment: Performed at [facility] [address] [place], [place] [address]  Phosphorus     Status: Abnormal   Collection Time: 10/19/17   4:39 AM  Result Value Ref Range   Phosphorus 2.2 (L) 2.5 - 4.6 mg/dL    Comment: Performed at [facility] [address], [place], [place] [address]  Glucose, capillary     Status: Abnormal   Collection Time: 10/19/17  8:09 AM  Result Value Ref Range   Glucose-Capillary 135 (H) 70 - 99 mg/dL   Comment 1 Document in Chart   Glucose, capillary     Status: Abnormal   Collection Time: 10/19/17 11:59 AM  Result Value Ref Range   Glucose-Capillary 128 (H) 70 - 99 mg/dL   Comment 1 Document in Chart   Glucose, capillary     Status: Abnormal   Collection Time: 10/19/17  5:38 PM  Result Value Ref Range   Glucose-Capillary 132 (H) 70 - 99 mg/dL  Glucose, capillary     Status: Abnormal   Collection Time: 10/19/17  9:36 PM  Result Value Ref Range   Glucose-Capillary 116 (H) 70 - 99 mg/dL  CBC     Status: None   Collection Time: 10/20/17  5:52 AM  Result Value Ref Range   WBC 6.2 4.0 - 10.5 K/uL   RBC 4.46 3.87 - 5.11 MIL/uL   Hemoglobin 13.1 12.0 - 15.0 g/dL   HCT 40.9 36.0 - 46.0 %   MCV 91.7 80.0 -  100.0 fL   MCH 29.4 26.0 - 34.0 pg   MCHC 32.0 30.0 - 36.0 g/dL   RDW 14.0 11.5 - 15.5 %   Platelets 288 150 - 400 K/uL   nRBC 0.0 0.0 - 0.2 %    Comment: Performed at [facility], [place] [address], [place], [place] [address]  Vitamin B12     Status: None   Collection Time: 10/20/17  5:52 AM  Result Value Ref Range   Vitamin B-12 438 180 - 914 pg/mL    Comment: (NOTE) This assay is not validated for testing neonatal or myeloproliferative syndrome specimens for Vitamin B12 levels. Performed at [facility], [place] [address] [place], [place] [address]   Basic metabolic panel     Status: Abnormal   Collection Time: 10/20/17  5:52 AM  Result Value Ref Range   Sodium 140 135 - 145 mmol/L   Potassium 4.0 3.5 - 5.1 mmol/L   Chloride 104 98 - 111 mmol/L   CO2 30 22 - 32 mmol/L   Glucose, Bld 93 70 - 99 mg/dL   BUN 26 (H) 8 - 23 mg/dL   Creatinine, Ser 0.83 0.44 - 1.00  mg/dL   Calcium 9.6 8.9 - 10.3 mg/dL   GFR calc non Af Amer >60 >60 mL/min   GFR calc Af Amer >60 >60 mL/min    Comment: (NOTE) The eGFR has been calculated using the CKD EPI equation. This calculation has not been validated in all clinical situations. eGFR's persistently <60 mL/min signify possible Chronic Kidney Disease.    Anion gap 6 5 - 15    Comment: Performed at [facility] [address] [place], [place] [address]  Magnesium     Status: None   Collection Time: 10/20/17  5:52 AM  Result Value Ref Range   Magnesium 2.2 1.7 - 2.4 mg/dL    Comment: Performed at [facility] [address] [place], [place] [address]  Phosphorus     Status: None   Collection Time: 10/20/17  5:52 AM  Result Value Ref Range   Phosphorus 2.5 2.5 - 4.6 mg/dL    Comment: Performed at [facility] [address], [place], [place] [address]  Glucose, capillary     Status: None   Collection Time: 10/20/17  7:35 AM  Result Value Ref Range   Glucose-Capillary 91 70 - 99 mg/dL  Glucose, capillary     Status: None   Collection Time: 10/20/17 11:38 AM  Result Value Ref Range   Glucose-Capillary 84 70 - 99 mg/dL    Blood Alcohol level:  Lab Results  Component Value Date   ETH <10 06/12/2017   ETH <10 05/25/2017    Musculoskeletal: Strength & Muscle Tone: Generalized weakness Gait & Station: UTA since patient is lying in bed. Patient leans: N/A  Psychiatric Specialty Exam: Physical Exam  Nursing note and vitals reviewed. Constitutional: She is oriented to person, place, and time. She appears well-developed.  Thin  HENT:  Head: Normocephalic and atraumatic.  Neck: Normal range of motion.  Respiratory: Effort normal.  Musculoskeletal: Normal range of motion.  Neurological: She is alert and oriented to person, place, and time.  Psychiatric: Judgment and thought content normal. Her speech is delayed. She is slowed. Cognition and memory are normal. She exhibits a depressed  mood.    Review of Systems  Psychiatric/Behavioral: Positive for depression. Negative for hallucinations, substance abuse and suicidal ideas. The patient is  nervous/anxious. The patient does not have insomnia.   All other systems reviewed and are negative.   Blood pressure 103/62, pulse 88, temperature 98.2 F (36.8 C), temperature source Axillary, resp. rate 18, height _0  (1.575 m), weight 41.1 kg, SpO2 99 %.Body mass index is 16.57 kg/m.  General Appearance: Fairly Groomed, elderly, cachectic, Caucasian female, wearing a hospital gown and lying in bed. NAD.   Eye Contact:  Fair  Speech:  Clear and Coherent and Slow  Volume:  Decreased  Mood:  Anxious  Affect:  Depressed  Thought Process:  Linear and Descriptions of Associations: Intact  Orientation:  Full (Time, Place, and Person)  Thought Content:  Logical  Suicidal Thoughts:  No  Homicidal Thoughts:  No  Memory:  Immediate;   Fair Recent;   Fair Remote;   Fair  Judgement:  Fair  Insight:  Fair  Psychomotor Activity:  Psychomotor Retardation  Concentration:  Concentration: Fair and Attention Span: Fair  Recall:  [employer] of Knowledge:  Fair  Language:  Fair  Akathisia:  No  Handed:  Right  AIMS (if indicated):   N/A  Assets:  Housing Social Support  ADL's:  Impaired  Cognition: Impaired due to psychiatric condition.   Sleep:   Okay    Assessment:  Amanda Hall is a 67 y.o. female who was admitted with AKI secondary to poor PO intake. She continues to exhibit signs of depression including psychomotor retardation, poor appetite with significant weight loss requiring tube feeds and poorly managed anxiety. Recommend starting low dose Lithium for refractory depression and discontinue Effexor to avoid polypharmacy.   Treatment Plan Summary: -Patient warrants inpatient psychiatric hospitalization for severe depression and anxiety causing significant impairments in daily functioning with poor PO intake and severe  malnutrition. -Continue bedside sitter.  -Start Lithium 300 mg qhs for refractory depression/anxiety. -Discontinue Effexor to avoid polypharmacy.  -Reduce Zyprexa 12.5 mg qhs back to 10 mg qhs for mood stabilization and anxiety since higher dose has not been more effective for anxiety.  -Continue home medications:Luvox 150 mg qhsfor depression and anxiety and Remeron 45 mg qhsfor depression and anxiety. -Recommend repeating EKG since last obtained on 9/25 and QTc prolonging medications have been increased since this time. -Patient is IVC'd so she may not leave the hospital.   -Will follow patient as needed.     Faythe Dingwall, DO 10/20/2017, 11:57 AM

## 2017-10-20 NOTE — Progress Notes (Signed)
[facility] notified that patient is having a panic attack. Ilean Skill LPN

## 2017-10-20 NOTE — Progress Notes (Signed)
PROGRESS NOTE    Amanda Hall  ZOX:[id number] DOB: 27-May-1950 DOA: 10/05/2017 PCP: [name], MD    Brief Narrative:  67 year old woman PMH depression, anxiety, recent inpatient psychiatric admission, presented with increased confusion, agitation, refusal to take medications, not eating, not drinking.  Admitted for acute kidney injury, acute encephalopathy.   Assessment & Plan:   Principal Problem:   MDD (major depressive disorder), recurrent severe, without psychosis ([facility]) Active Problems:   MDD (major depressive disorder)   Anorexia   Dysphagia   Severe malnutrition ([facility])   Anxiety  Depression severe, anxiety;  Continue with Zyprexa, luvox, Remeron Dr Nelva Bush will follow on patient once a week.  She has had levels check for TSH, HIV, Ammonia level within normal limits.  Appreciate Dr Nelva Bush help. effexor discontinue. Patient will be started on lithium.  Required inpatient psychiatric admission.   Severe malnutrition; underweight.  cortrak was placed after discussion with sister.  On tube feeding. Started 10-07 Monitor for refeeding syndrome.  Replete phosphorus.  On remeron.  B 12 438 and vitamin d pending.   Follow electrolytes   Dysphagia. --Continue dysphagia 1 diet, thin liquids, medications crushed with pure  Acute kidney injury, dehydration, hypernatremia secondary to refusing to eat and drink secondary to underlying psychiatric disorder --Resolved with hydration.    Acute encephalopathy with agitation, confusion on admission, secondary to anxiety, depression.  No evidence of infection.  Metabolic derangement corrected..  IVC paperwork completed in the emergency department.  Head CT negative for acute findings. --Continue care as above  SIRS.  Afebrile but had mild leukocytosis, tachycardia and elevated lactic acid on admission.  Urinalysis and chest x-ray without evidence of infection.  Thought secondary to dehydration. --Resolved   DVT  prophylaxis: lovenox Code Status: full code.  Family Communication:  No family at bedside.  Disposition Plan: awaiting psychiatric facility   Consultants:   Psych     Procedures:  none  Antimicrobials:   none   Subjective: Sleepy, wake up answer few questions.   Objective: Vitals:   10/19/17 2113 10/20/17 0548 10/20/17 1200 10/20/17 1600  BP:  103/62 (!) 104/59 113/72  Pulse:  88 94 95  Resp:  18 20 20   Temp: 97.7 F (36.5 C) 98.2 F (36.8 C)  98.5 F (36.9 C)  TempSrc: Oral Axillary  Axillary  SpO2:  99% 98% 97%  Weight:      Height:        Intake/Output Summary (Last 24 hours) at 10/20/2017 1652 Last data filed at 10/20/2017 1300 Gross per 24 hour  Intake 170 ml  Output 200 ml  Net -30 ml   Filed Weights   10/16/17 2038 10/17/17 0358 10/18/17 0603  Weight: 41.4 kg 40.7 kg 41.1 kg    Examination:  General exam: Cachetic, flat affect.  Respiratory system: CTA Cardiovascular system: S 1, S 2 RRR Gastrointestinal system: BS present, soft, nt Central nervous system: sleepy  Extremities; moves extremities.  Skin: No rashes.  Psychiatry: Flat affect.   Data Reviewed: I have personally reviewed following labs and imaging studies  CBC: Recent Labs  Lab 10/20/17 0552  WBC 6.2  HGB 13.1  HCT 40.9  MCV 91.7  PLT 288   Basic Metabolic Panel: Recent Labs  Lab 10/14/17 0902 10/17/17 0355 10/18/17 0352 10/19/17 0439 10/20/17 0552  NA 138 139 141 138 140  K 3.7 3.5 3.5 3.6 4.0  CL 105 108 107 103 104  CO2 25 25 27 30 30   GLUCOSE 118* 81  134* 132* 93  BUN 14 25* 27* 27* 26*  CREATININE 0.75 0.90 0.89 0.81 0.83  CALCIUM 9.8 9.9 9.7 9.8 9.6  MG 2.2 2.3 2.2 2.3 2.2  PHOS 2.6 2.9 2.7 2.2* 2.5   GFR: Estimated Creatinine Clearance: 43.3 mL/min (by C-G formula based on SCr of 0.83 mg/dL). Liver Function Tests: No results for input(s): AST, ALT, ALKPHOS, BILITOT, PROT, ALBUMIN in the last 168 hours. No results for input(s): LIPASE, AMYLASE in  the last 168 hours. No results for input(s): AMMONIA in the last 168 hours. Coagulation Profile: No results for input(s): INR, PROTIME in the last 168 hours. Cardiac Enzymes: No results for input(s): CKTOTAL, CKMB, CKMBINDEX, TROPONINI in the last 168 hours. BNP (last 3 results) No results for input(s): PROBNP in the last 8760 hours. HbA1C: No results for input(s): HGBA1C in the last 72 hours. CBG: Recent Labs  Lab 10/19/17 1159 10/19/17 1738 10/19/17 2136 10/20/17 0735 10/20/17 1138  GLUCAP 128* 132* 116* 91 84   Lipid Profile: No results for input(s): CHOL, HDL, LDLCALC, TRIG, CHOLHDL, LDLDIRECT in the last 72 hours. Thyroid Function Tests: No results for input(s): TSH, T4TOTAL, FREET4, T3FREE, THYROIDAB in the last 72 hours. Anemia Panel: Recent Labs    10/20/17 0552  VITAMINB12 438   Sepsis Labs: No results for input(s): PROCALCITON, LATICACIDVEN in the last 168 hours.  No results found for this or any previous visit (from the past 240 hour(s)).       Radiology Studies: No results found.      Scheduled Meds: . enoxaparin (LOVENOX) injection  30 mg Subcutaneous Q24H  . feeding supplement (ENSURE ENLIVE)  237 mL Oral TID BM  . fluvoxaMINE  150 mg Oral QHS  . lithium carbonate  300 mg Oral QHS  . magnesium oxide  400 mg Per Tube BID  . megestrol  400 mg Oral Daily  . mirtazapine  45 mg Oral QHS  . multivitamin  15 mL Oral Daily  . OLANZapine zydis  10 mg Oral QHS  . polyethylene glycol  17 g Oral BID  . potassium & sodium phosphates  2 packet Oral BID  . sennosides  5 mL Oral QHS  . simvastatin  10 mg Oral q1800  . sodium chloride flush  3 mL Intravenous Q12H   Continuous Infusions: . feeding supplement (OSMOLITE 1.2 CAL) 1,000 mL (10/19/17 1513)     LOS: 15 days    Time spent: 35 minutes.     Alba Cory, MD [facility] Pager [phone]  If 7PM-7AM, please contact night-coverage [email or web address] Password [email or web address] 10/20/2017,  4:52 PM

## 2017-10-20 NOTE — Plan of Care (Signed)
°  Problem: Coping: °Goal: Level of anxiety will decrease °Outcome: Progressing °  °

## 2017-10-20 NOTE — Progress Notes (Addendum)
Patient remains IVC'd, with disposition to inpatient psych. Most inpatient psych will not accept patient with cortrak for feeds. Spoke w Erie Noe CSW about the possibility of [facility]. She will call to see if they accept cortrak. Another dispo option could possibly be LTAC, doubtful as patient has not has ICU stay. Spoke w Irving Burton, [facility], who states that she is gathering data needed to obtain insurance auth. CM will continue to follow with CSW and LTAC.  14:00 LTAC cannot accept.

## 2017-10-21 LAB — GLUCOSE, CAPILLARY
GLUCOSE-CAPILLARY: 128 mg/dL — AB (ref 70–99)
GLUCOSE-CAPILLARY: 134 mg/dL — AB (ref 70–99)
GLUCOSE-CAPILLARY: 140 mg/dL — AB (ref 70–99)
Glucose-Capillary: 143 mg/dL — ABNORMAL HIGH (ref 70–99)
Glucose-Capillary: 134 mg/dL — ABNORMAL HIGH (ref 70–99)
Glucose-Capillary: 141 mg/dL — ABNORMAL HIGH (ref 70–99)

## 2017-10-21 LAB — BASIC METABOLIC PANEL
Anion gap: 7 (ref 5–15)
BUN: 28 mg/dL — ABNORMAL HIGH (ref 8–23)
CALCIUM: 9.1 mg/dL (ref 8.9–10.3)
CO2: 27 mmol/L (ref 22–32)
CREATININE: 0.81 mg/dL (ref 0.44–1.00)
Chloride: 104 mmol/L (ref 98–111)
GFR calc non Af Amer: >60 mL/min (ref >60)
GLUCOSE: 141 mg/dL — AB (ref 70–99)
Potassium: 4.1 mmol/L (ref 3.5–5.1)
Sodium: 138 mmol/L (ref 135–145)

## 2017-10-21 LAB — PHOSPHORUS: PHOSPHORUS: 2.4 mg/dL — AB (ref 2.5–4.6)

## 2017-10-21 LAB — MAGNESIUM: Magnesium: 2 mg/dL (ref 1.7–2.4)

## 2017-10-21 LAB — VITAMIN D 25 HYDROXY (VIT D DEFICIENCY, FRACTURES): Vit D, 25-Hydroxy: 32.9 ng/mL (ref 30.0–100.0)

## 2017-10-21 MED ORDER — THIAMINE HCL 100 MG/ML IJ SOLN
100.0000 mg | Freq: Every day | INTRAMUSCULAR | Status: DC
  Administered 2017-10-22 – 2017-10-26 (×5): 100 mg via INTRAVENOUS
  Filled 2017-10-21 (×5): qty 2

## 2017-10-21 NOTE — Progress Notes (Signed)
PROGRESS NOTE    Amanda Hall  UJW:[id number] DOB: 03-04-1950 DOA: 10/05/2017 PCP: [name], MD    Brief Narrative:  67 year old woman PMH depression, anxiety, recent inpatient psychiatric admission, presented with increased confusion, agitation, refusal to take medications, not eating, not drinking.  Admitted for acute kidney injury, acute encephalopathy.   Assessment & Plan:   Principal Problem:   MDD (major depressive disorder), recurrent severe, without psychosis (HCC) Active Problems:   MDD (major depressive disorder)   Anorexia   Dysphagia   Severe malnutrition (HCC)   Anxiety  Depression severe, anxiety;  Continue with Zyprexa, luvox, Remeron Dr Nelva Bush will follow on patient once a week.  She has had levels check for TSH, HIV, Ammonia level within normal limits.  Appreciate Dr Nelva Bush help. effexor discontinue. Patient will be started on lithium.  Require inpatient psychiatric admission.   Severe malnutrition; underweight.  cortrak was placed after discussion with sister.  On tube feeding. Started 10-07 Monitor for refeeding syndrome.  Replete phosphorus.  On remeron.  B 12 438 and vitamin d pending.  Check vitamin B 1 level. And start supplement.  Electrolytes stable.   Dysphagia. --Continue dysphagia 1 diet, thin liquids, medications crushed with pure  Acute kidney injury, dehydration, hypernatremia secondary to refusing to eat and drink secondary to underlying psychiatric disorder --Resolved with hydration.    Acute encephalopathy with agitation, confusion on admission, secondary to anxiety, depression.  No evidence of infection.  Metabolic derangement corrected..  IVC paperwork completed in the emergency department.  Head CT negative for acute findings. --Continue care as above  SIRS.  Afebrile but had mild leukocytosis, tachycardia and elevated lactic acid on admission.  Urinalysis and chest x-ray without evidence of infection.  Thought secondary  to dehydration. --Resolved   DVT prophylaxis: lovenox Code Status: full code.  Family Communication:  No family at bedside.  Disposition Plan: awaiting psychiatric facility   Consultants:   Psych     Procedures:  none  Antimicrobials:   none   Subjective: She is sitting up today, feels bad, weak.    Objective: Vitals:   10/20/17 2349 10/21/17 0406 10/21/17 0710 10/21/17 0747  BP: 116/82 140/83 129/76   Pulse: (!) 106 (!) 105 92   Resp: 18 16 16    Temp: 97.6 F (36.4 C) (!) 97.5 F (36.4 C)  98.2 F (36.8 C)  TempSrc: Oral Oral  Oral  SpO2: 99% 98% 99%   Weight:      Height:        Intake/Output Summary (Last 24 hours) at 10/21/2017 1428 Last data filed at 10/21/2017 1304 Gross per 24 hour  Intake 777 ml  Output 600 ml  Net 177 ml   Filed Weights   10/16/17 2038 10/17/17 0358 10/18/17 0603  Weight: 41.4 kg 40.7 kg 41.1 kg    Examination:  General exam: Cachetic, flat affect Respiratory system: CTA Cardiovascular system: S 1, S 2 RRR Gastrointestinal system: BS present, soft, nt Central nervous system: Sleepy.  Extremities; Moves extremities.  Skin: No rashes.  Psychiatry: Flat affect.   Data Reviewed: I have personally reviewed following labs and imaging studies  CBC: Recent Labs  Lab 10/20/17 0552  WBC 6.2  HGB 13.1  HCT 40.9  MCV 91.7  PLT 288   Basic Metabolic Panel: Recent Labs  Lab 10/17/17 0355 10/18/17 0352 10/19/17 0439 10/20/17 0552 10/21/17 0511 10/21/17 0757  NA 139 141 138 140  --  138  K 3.5 3.5 3.6 4.0  --  4.1  CL 108 107 103 104  --  104  CO2 25 27 30 30   --  27  GLUCOSE 81 134* 132* 93  --  141*  BUN 25* 27* 27* 26*  --  28*  CREATININE 0.90 0.89 0.81 0.83  --  0.81  CALCIUM 9.9 9.7 9.8 9.6  --  9.1  MG 2.3 2.2 2.3 2.2 2.0  --   PHOS 2.9 2.7 2.2* 2.5 2.4*  --    GFR: Estimated Creatinine Clearance: 44.3 mL/min (by C-G formula based on SCr of 0.81 mg/dL). Liver Function Tests: No results for input(s):  AST, ALT, ALKPHOS, BILITOT, PROT, ALBUMIN in the last 168 hours. No results for input(s): LIPASE, AMYLASE in the last 168 hours. No results for input(s): AMMONIA in the last 168 hours. Coagulation Profile: No results for input(s): INR, PROTIME in the last 168 hours. Cardiac Enzymes: No results for input(s): CKTOTAL, CKMB, CKMBINDEX, TROPONINI in the last 168 hours. BNP (last 3 results) No results for input(s): PROBNP in the last 8760 hours. HbA1C: No results for input(s): HGBA1C in the last 72 hours. CBG: Recent Labs  Lab 10/20/17 2019 10/20/17 2358 10/21/17 0417 10/21/17 0746 10/21/17 1131  GLUCAP 121* 134* 140* 143* 128*   Lipid Profile: No results for input(s): CHOL, HDL, LDLCALC, TRIG, CHOLHDL, LDLDIRECT in the last 72 hours. Thyroid Function Tests: No results for input(s): TSH, T4TOTAL, FREET4, T3FREE, THYROIDAB in the last 72 hours. Anemia Panel: Recent Labs    10/20/17 0552  VITAMINB12 438   Sepsis Labs: No results for input(s): PROCALCITON, LATICACIDVEN in the last 168 hours.  No results found for this or any previous visit (from the past 240 hour(s)).       Radiology Studies: No results found.      Scheduled Meds: . enoxaparin (LOVENOX) injection  30 mg Subcutaneous Q24H  . feeding supplement (ENSURE ENLIVE)  237 mL Oral TID BM  . fluvoxaMINE  150 mg Oral QHS  . lithium carbonate  300 mg Oral QHS  . magnesium oxide  400 mg Per Tube BID  . megestrol  400 mg Oral Daily  . mirtazapine  45 mg Oral QHS  . multivitamin  15 mL Oral Daily  . OLANZapine zydis  10 mg Oral QHS  . polyethylene glycol  17 g Oral BID  . potassium & sodium phosphates  2 packet Oral BID  . sennosides  5 mL Oral QHS  . simvastatin  10 mg Oral q1800  . sodium chloride flush  3 mL Intravenous Q12H   Continuous Infusions: . feeding supplement (OSMOLITE 1.2 CAL) 1,000 mL (10/20/17 1803)     LOS: 16 days    Time spent: 35 minutes.     Alba Cory, MD [facility] Pager [phone]  If 7PM-7AM, please contact night-coverage [email or web address] Password [email or web address] 10/21/2017, 2:28 PM

## 2017-10-21 NOTE — Progress Notes (Signed)
Nutrition Follow-up  DOCUMENTATION CODES:   Severe malnutrition in context of social or environmental circumstances, Underweight  INTERVENTION:   Tube Feeding:  Continue Osmolite 1.5 @ 55 ml/hr via Cortrak  Feeding assistance and encouragement at meal times  Magic cup TID with meals, each supplement provides 290 kcal and 9 grams of protein; offer more often as well if pt will take  Continue Ensure Enlive po BID, each supplement provides 350 kcal and 20 grams of protein   NUTRITION DIAGNOSIS:   Severe Malnutrition related to social / environmental circumstances(severe major depressive disorder, refractory depression, anxiety) as evidenced by severe fat depletion, severe muscle depletion.  Continues but being addressed via nutrition support  GOAL:   Patient will meet greater than or equal to 90% of their needs  Met  MONITOR:   PO intake, Supplement acceptance, Labs, Weight trends  REASON FOR ASSESSMENT:   Malnutrition Screening Tool    ASSESSMENT:   67 yo female admitted with AKI, dehydration, anorexia with acute metabolic encephalopathy, FTT. Pt has been refusing to eat, drink or take medications. Pt has severe protein calorie malnutrition. Pt with recent inpatient psych hospitalization 1 month ago. Pt reports difficulty swallowing although pt's sister believes this to be subjective. Pt with MDD, severe, with psych consult. PMH includes anxiety, depression, malnutrition   Pt alert, sitting up in chair. 1:1 sitter present  Pt unsure if she ate breakfast. Pt reports she is drinking Ensure but noted unopened one at bedside from this AM. Per RN, pt continues to only eat bites of meals. Pt reports she will eat, but then does not.  Pt not drinking Ensure Enlive. RN does report pt will take Magic Cup supplements sometimes (not all the time). RD to order Magic Cup more frequently.   Cortrak tube in place; noted that most inpatient psych facilities will not accept feeding tubes.  Pt continues to eat minimally  Tolerating Osmolite 1.2 at 55 ml/hr  Considered switching TF to nocturnal feedings to promote po intake. Given that the pt's poor intake has been attributed to her psychological status, intake not likely to improve with making this modification. Plan to continue continuous feedings at present time.  Weight trend continues to remain stable  B12 and Vitmain D level wdl. Noted B1 pending  Labs: reviewed Meds: lithium carbonate, megace, mag-ox, remeron, liquid MVI, Phos-Nak   Diet Order:   Diet Order            DIET - DYS 1 Room service appropriate? Yes; Fluid consistency: Thin  Diet effective now              EDUCATION NEEDS:   Not appropriate for education at this time  Skin:  Skin Assessment: Reviewed RN Assessment  Last BM:  10/10  Height:   Ht Readings from Last 1 Encounters:  10/05/17 5' 2"  (1.575 m)    Weight:   Wt Readings from Last 1 Encounters:  10/18/17 41.1 kg    Ideal Body Weight:  50 kg  BMI:  Body mass index is 16.57 kg/m.  Estimated Nutritional Needs:   Kcal:  1430-1640 kcals   Protein:  72-82 g   Fluid:  >/= 1.4 L  Kerman Passey MS, RD, LDN, CNSC [phone] Pager  [phone] Weekend/On-Call Pager

## 2017-10-21 NOTE — Clinical Social Work Note (Addendum)
CSW contacted [facility] and talked with Britta Mccreedy, admitting nurse to determine if they accept Cortrak feeding tubes. CSW had one of the [facility] floor nurses talk with Britta Mccreedy to give clarity regarding the Cortrak. Britta Mccreedy requested that CSW fax clinicals to her, so that they can review and determine if they can take patient before this CSW goes through the formal process of going through the [facility], etc. Clinicals faxed at 1:05 pm.  CSW talked again with Britta Mccreedy at 2:26 pm and she is unsure if they can manage patient and gave clinicals to Dr. Adriana Simas for review. CSW advised by Britta Mccreedy that if this CSW does not receive a call back today, she is requesting that CSW call on Monday and ask for Chrissie Noa. CSW will follow-up with [facility] on Monday.  Genelle Bal, MSW, LCSW Licensed Clinical Social Worker Clinical Social Work Department [employer] [phone]

## 2017-10-21 NOTE — Progress Notes (Signed)
Physical Therapy Treatment Patient Details Name: Amanda Hall MRN: [id number] DOB: October 23, 1950 Today's Date: 10/21/2017    History of Present Illness Pt is a 67 y/o female admitted secondary to major depressive disorder. Pt with increased confusion and agitation prior to admission. Found to have AKI and acute encephalopathy. CT negative for acute abnormality. PMH includes anxiety and depression.     PT Comments    Patient progressing slowly towards PT goals. Agreeable to participate in therapy today. Answering questions and engaging with therapist. Continues to be highly anxious. Tolerated standing and taking a few steps to get to chair with Mod A-heavy posterior lean and narrow BoS.  Recommend 2 person to attempt gait training as pt gets anxious easily. Tolerated there ex. Will continue to follow and progress as tolerated.   Follow Up Recommendations  Supervision/Assistance - 24 hour;Other (comment)(inpatient psych)     Equipment Recommendations  None recommended by PT    Recommendations for Other Services       Precautions / Restrictions Precautions Precautions: Fall Precaution Comments: Recruitment consultant, cortrack tube Restrictions Weight Bearing Restrictions: No    Mobility  Bed Mobility Overal bed mobility: Needs Assistance Bed Mobility: Rolling;Sidelying to Sit Rolling: Min assist Sidelying to sit: Min assist;HOB elevated       General bed mobility comments: Step by step cues to reach for rail and get to EOB, assist with trunk.  Transfers Overall transfer level: Needs assistance Equipment used: 1 person hand held assist Transfers: Sit to/from [employer] Sit to Stand: Mod assist Stand pivot transfers: Mod assist;+2 safety/equipment       General transfer comment: Assist to power to standing with heavy posterior lean and narrow BoS. Able to take a few steps to get to chair with increased knee flexion bilaterally and posterior lean. "I need to  sit."  Ambulation/Gait             General Gait Details: Deferred due to anxiety.   Stairs             Wheelchair Mobility    Modified Rankin (Stroke Patients Only)       Balance Overall balance assessment: Needs assistance Sitting-balance support: No upper extremity supported;Feet supported Sitting balance-Leahy Scale: Good Sitting balance - Comments: Able to sit unsupported, holding hand at time for comfort.    Standing balance support: During functional activity Standing balance-Leahy Scale: Poor Standing balance comment: Reliant on external support for standing balance. Posterior lean.                            Cognition Arousal/Alertness: Awake/alert Behavior During Therapy: Anxious Overall Cognitive Status: No family/caregiver present to determine baseline cognitive functioning                                 General Comments: Answering questions appropriately today, stuttering at times. Highly anxious.      Exercises General Exercises - Lower Extremity Ankle Circles/Pumps: AROM;Both;10 reps Long Arc Quad: Both;10 reps;Seated    General Comments General comments (skin integrity, edema, etc.): Sitter present      Pertinent Vitals/Pain Pain Assessment: No/denies pain    Home Living                      Prior Function            PT Goals (current goals can now be  found in the care plan section) Progress towards PT goals: Progressing toward goals    Frequency    Min 2X/week      PT Plan Current plan remains appropriate    Co-evaluation              AM-PAC PT "6 Clicks" Daily Activity  Outcome Measure  Difficulty turning over in bed (including adjusting bedclothes, sheets and blankets)?: Unable Difficulty moving from lying on back to sitting on the side of the bed? : Unable Difficulty sitting down on and standing up from a chair with arms (e.g., wheelchair, bedside commode, etc,.)?:  Unable Help needed moving to and from a bed to chair (including a wheelchair)?: A Lot Help needed walking in hospital room?: Total Help needed climbing 3-5 steps with a railing? : Total 6 Click Score: 7    End of Session Equipment Utilized During Treatment: Gait belt Activity Tolerance: Other (comment);Patient tolerated treatment well(limited by anxiety) Patient left: in chair;with call bell/phone within reach;with chair alarm set;with nursing/sitter in room Nurse Communication: Mobility status PT Visit Diagnosis: Unsteadiness on feet (R26.81);Muscle weakness (generalized) (M62.81)     Time: 2130-8657 PT Time Calculation (min) (ACUTE ONLY): 18 min  Charges:  $Therapeutic Activity: 8-22 mins                     [name], PT, DPT Acute Rehabilitation Services Pager [phone] Office [phone]       [name] 10/21/2017, 11:20 AM

## 2017-10-22 LAB — GLUCOSE, CAPILLARY
GLUCOSE-CAPILLARY: 141 mg/dL — AB (ref 70–99)
GLUCOSE-CAPILLARY: 145 mg/dL — AB (ref 70–99)
GLUCOSE-CAPILLARY: 124 mg/dL — AB (ref 70–99)
GLUCOSE-CAPILLARY: 121 mg/dL — AB (ref 70–99)
Glucose-Capillary: 132 mg/dL — ABNORMAL HIGH (ref 70–99)

## 2017-10-22 LAB — BASIC METABOLIC PANEL
Anion gap: 8 (ref 5–15)
BUN: 25 mg/dL — ABNORMAL HIGH (ref 8–23)
CALCIUM: 9.8 mg/dL (ref 8.9–10.3)
CO2: 29 mmol/L (ref 22–32)
CREATININE: 0.84 mg/dL (ref 0.44–1.00)
Chloride: 102 mmol/L (ref 98–111)
GFR calc non Af Amer: >60 mL/min (ref >60)
Glucose, Bld: 139 mg/dL — ABNORMAL HIGH (ref 70–99)
Potassium: 3.9 mmol/L (ref 3.5–5.1)
Sodium: 139 mmol/L (ref 135–145)

## 2017-10-22 LAB — CBC
HEMATOCRIT: 37.8 % (ref 36.0–46.0)
HEMOGLOBIN: 12.4 g/dL (ref 12.0–15.0)
MCH: 29.9 pg (ref 26.0–34.0)
MCHC: 32.8 g/dL (ref 30.0–36.0)
MCV: 91.1 fL (ref 80.0–100.0)
NRBC: 0 % (ref 0.0–0.2)
Platelets: 304 10*3/uL (ref 150–400)
RBC: 4.15 MIL/uL (ref 3.87–5.11)
RDW: 14.2 % (ref 11.5–15.5)
WBC: 6.2 10*3/uL (ref 4.0–10.5)

## 2017-10-22 LAB — MAGNESIUM: MAGNESIUM: 2 mg/dL (ref 1.7–2.4)

## 2017-10-22 LAB — PHOSPHORUS: PHOSPHORUS: 2.5 mg/dL (ref 2.5–4.6)

## 2017-10-22 MED ORDER — MAGNESIUM HYDROXIDE 400 MG/5ML PO SUSP
30.0000 mL | Freq: Every day | ORAL | Status: DC
  Administered 2017-10-22: 30 mL via ORAL
  Filled 2017-10-22 (×2): qty 30

## 2017-10-22 MED ORDER — SODIUM CHLORIDE 0.9 % IV SOLN
INTRAVENOUS | Status: DC
  Administered 2017-10-22 – 2017-11-08 (×24): via INTRAVENOUS

## 2017-10-22 MED ORDER — BISACODYL 10 MG RE SUPP
10.0000 mg | Freq: Once | RECTAL | Status: DC
  Filled 2017-10-22: qty 1

## 2017-10-22 MED ORDER — MILK AND MOLASSES ENEMA
1.0000 | Freq: Every day | RECTAL | Status: DC | PRN
  Filled 2017-10-22: qty 250

## 2017-10-22 NOTE — Consult Note (Addendum)
[facility] Face-to-Face Psychiatry Consult   Reason for Consult: ''depression, anxiety, not eating, needs regular re-assessment and medication titration'' Referring Physician:  Dr. Tyrell Antonio  Patient Identification: Amanda Hall MRN:  [id number] Principal Diagnosis: MDD (major depressive disorder), recurrent severe, without psychosis ([place]) Diagnosis:   Patient Active Problem List   Diagnosis Date Noted  . Anxiety [F41.9] 10/17/2017  . Dysphagia [R13.10] 10/12/2017  . Severe malnutrition ([place]) [E43] 10/12/2017  . MDD (major depressive disorder), recurrent severe, without psychosis ([place]) [F33.2]   . Anorexia [R63.0] 10/05/2017  . Uremia [N19]   . Protein-calorie malnutrition, severe [E43] 08/29/2017  . Generalized anxiety disorder [F41.1] 05/26/2017  . MDD (major depressive disorder) [F32.9] 05/26/2017  . Hypercholesterolemia [E78.00] 03/22/2017  . Nonspecific (abnormal) findings on radiological and other examination of body structure [793] 12/26/2006  . NONSPECIFIC ABNORM FIND RAD&OTH EXAM LUNG FIELD [R93.0] 12/26/2006  . ANEMIA-IRON DEFICIENCY [D50.9] 12/15/2006  . Depression [F32.9] 12/15/2006  . Allergic rhinitis [J30.9] 12/15/2006    Total Time spent with patient: 45 minutes  Subjective:   Amanda Hall is a 67 y.o. female patient admitted with confusion, agitation, refusing to eat,acute kidney injury and encephalopathy.  HPI:  Patient with history of Major depression Anxiety, Dysphagia, Anorexia and multiple medical problem who was admitted due to confusion, agitation, refusing to eat or drink and was found to have  acute kidney injury and encephalopathy. Today, patient is alert and was able to verbalize ongoing depressive symptoms. But patient is apprehensive and worry about her inability to eat with associated weight loss. She was seen by a psychiatrist 10/20/17 who made adjustment to her medications. Patient is calm, cooperative and denies psychosis, delusions, suicidal ideation, intent or  plan but says she is feeling weak.However, she reports feeling hopeless about her current medical condition.   Past Psychiatric History: as above  Risk to Self:  denies Risk to Others:  denies Prior Inpatient Therapy:   Prior Outpatient Therapy:    Past Medical History:  Past Medical History:  Diagnosis Date  . Allergy   . Anemia   . Anxiety   . Depression   . Hemorrhoids     Past Surgical History:  Procedure Laterality Date  . arm surgery     Left  . TUBAL LIGATION     Family History:  Family History  Problem Relation Age of Onset  . Stroke Mother   . Hypertension Sister   . Cancer Neg Hx        breat and colon hx  . Diabetes Neg Hx        family   Family Psychiatric  History:  Social History:  Social History   Substance and Sexual Activity  Alcohol Use No     Social History   Substance and Sexual Activity  Drug Use No    Social History   Socioeconomic History  . Marital status: Divorced    Spouse name: Not on file  . Number of children: Not on file  . Years of education: Not on file  . Highest education level: Not on file  Occupational History  . Not on file  Social Needs  . Financial resource strain: Not on file  . Food insecurity:    Worry: Not on file    Inability: Not on file  . Transportation needs:    Medical: Not on file    Non-medical: Not on file  Tobacco Use  . Smoking status: Never Smoker  . Smokeless tobacco: Never Used  Substance and Sexual  Activity  . Alcohol use: No  . Drug use: No  . Sexual activity: Not Currently  Lifestyle  . Physical activity:    Days per week: Not on file    Minutes per session: Not on file  . Stress: Not on file  Relationships  . Social connections:    Talks on phone: Not on file    Gets together: Not on file    Attends religious service: Not on file    Active member of club or organization: Not on file    Attends meetings of clubs or organizations: Not on file    Relationship status: Not on  file  Other Topics Concern  . Not on file  Social History Narrative  . Not on file   Additional Social History:    Allergies:   Allergies  Allergen Reactions  . Codeine Other (See Comments)    "Makes my head feel crazy"  . Sulfonamide Derivatives Nausea Only  . Tetanus Toxoid Other (See Comments)    "Made a Knot" (at site)    Labs:  Results for orders placed or performed during the hospital encounter of 10/05/17 (from the past 48 hour(s))  Glucose, capillary     Status: Abnormal   Collection Time: 10/20/17  4:54 PM  Result Value Ref Range   Glucose-Capillary 102 (H) 70 - 99 mg/dL  Glucose, capillary     Status: Abnormal   Collection Time: 10/20/17  8:19 PM  Result Value Ref Range   Glucose-Capillary 121 (H) 70 - 99 mg/dL  Glucose, capillary     Status: Abnormal   Collection Time: 10/20/17 11:58 PM  Result Value Ref Range   Glucose-Capillary 134 (H) 70 - 99 mg/dL  Glucose, capillary     Status: Abnormal   Collection Time: 10/21/17  4:17 AM  Result Value Ref Range   Glucose-Capillary 140 (H) 70 - 99 mg/dL  Magnesium     Status: None   Collection Time: 10/21/17  5:11 AM  Result Value Ref Range   Magnesium 2.0 1.7 - 2.4 mg/dL    Comment: Performed at [facility], [place] [address], [place], [place] [address]  Phosphorus     Status: Abnormal   Collection Time: 10/21/17  5:11 AM  Result Value Ref Range   Phosphorus 2.4 (L) 2.5 - 4.6 mg/dL    Comment: Performed at [facility] [address], [place], [place] [address]  Glucose, capillary     Status: Abnormal   Collection Time: 10/21/17  7:46 AM  Result Value Ref Range   Glucose-Capillary 143 (H) 70 - 99 mg/dL  Basic metabolic panel     Status: Abnormal   Collection Time: 10/21/17  7:57 AM  Result Value Ref Range   Sodium 138 135 - 145 mmol/L   Potassium 4.1 3.5 - 5.1 mmol/L   Chloride 104 98 - 111 mmol/L   CO2 27 22 - 32 mmol/L   Glucose, Bld 141 (H) 70 - 99 mg/dL   BUN 28 (H) 8 - 23 mg/dL    Creatinine, Ser 0.81 0.44 - 1.00 mg/dL   Calcium 9.1 8.9 - 10.3 mg/dL   GFR calc non Af Amer >60 >60 mL/min   GFR calc Af Amer >60 >60 mL/min    Comment: (NOTE) The eGFR has been calculated using the CKD EPI equation. This calculation has not been validated in all clinical situations. eGFR's persistently <60 mL/min signify possible Chronic Kidney Disease.    Anion gap 7  5 - 15    Comment: Performed at [facility], [place] [address], [place], [place] [address]  Glucose, capillary     Status: Abnormal   Collection Time: 10/21/17 11:31 AM  Result Value Ref Range   Glucose-Capillary 128 (H) 70 - 99 mg/dL  Glucose, capillary     Status: Abnormal   Collection Time: 10/21/17  4:46 PM  Result Value Ref Range   Glucose-Capillary 134 (H) 70 - 99 mg/dL  Glucose, capillary     Status: Abnormal   Collection Time: 10/21/17  9:32 PM  Result Value Ref Range   Glucose-Capillary 141 (H) 70 - 99 mg/dL  Glucose, capillary     Status: Abnormal   Collection Time: 10/22/17 12:13 AM  Result Value Ref Range   Glucose-Capillary 132 (H) 70 - 99 mg/dL  Glucose, capillary     Status: Abnormal   Collection Time: 10/22/17  4:40 AM  Result Value Ref Range   Glucose-Capillary 141 (H) 70 - 99 mg/dL  Magnesium     Status: None   Collection Time: 10/22/17  6:29 AM  Result Value Ref Range   Magnesium 2.0 1.7 - 2.4 mg/dL    Comment: Performed at [facility], [place] [address], [place], [place] [address]  Phosphorus     Status: None   Collection Time: 10/22/17  6:29 AM  Result Value Ref Range   Phosphorus 2.5 2.5 - 4.6 mg/dL    Comment: Performed at [facility] [address] [place], [place] [address]  Basic metabolic panel     Status: Abnormal   Collection Time: 10/22/17  6:29 AM  Result Value Ref Range   Sodium 139 135 - 145 mmol/L   Potassium 3.9 3.5 - 5.1 mmol/L   Chloride 102 98 - 111 mmol/L   CO2 29 22 - 32 mmol/L   Glucose, Bld 139 (H) 70 - 99 mg/dL   BUN 25 (H) 8 - 23 mg/dL    Creatinine, Ser 0.84 0.44 - 1.00 mg/dL   Calcium 9.8 8.9 - 10.3 mg/dL   GFR calc non Af Amer >60 >60 mL/min   GFR calc Af Amer >60 >60 mL/min    Comment: (NOTE) The eGFR has been calculated using the CKD EPI equation. This calculation has not been validated in all clinical situations. eGFR's persistently <60 mL/min signify possible Chronic Kidney Disease.    Anion gap 8 5 - 15    Comment: Performed at [facility] [address], [place], [place] [address]    Current Facility-Administered Medications  Medication Dose Route Frequency Provider Last Rate Last Dose  . 0.9 %  sodium chloride infusion   Intravenous Continuous Regalado, Belkys A, MD 75 mL/hr at 10/22/17 1033    . acetaminophen (TYLENOL) tablet 650 mg  650 mg Oral Q6H PRN Samuella Cota, MD       Or  . acetaminophen (TYLENOL) suppository 650 mg  650 mg Rectal Q6H PRN Samuella Cota, MD      . bisacodyl (DULCOLAX) suppository 10 mg  10 mg Rectal Once Regalado, Belkys A, MD      . enoxaparin (LOVENOX) injection 30 mg  30 mg Subcutaneous Q24H Samuella Cota, MD   30 mg at 10/22/17 1134  . feeding supplement (ENSURE ENLIVE) (ENSURE ENLIVE) liquid 237 mL  237 mL Oral TID BM Samuella Cota, MD   237 mL at 10/18/17 2133  . feeding supplement (OSMOLITE 1.2 CAL) liquid 1,000  mL  1,000 mL Per Tube Continuous Regalado, Belkys A, MD 55 mL/hr at 10/22/17 1110 1,000 mL at 10/22/17 1110  . fluvoxaMINE (LUVOX) tablet 150 mg  150 mg Oral QHS Samuella Cota, MD   150 mg at 10/21/17 2256  . lithium carbonate (LITHOBID) CR tablet 300 mg  300 mg Oral QHS Faythe Dingwall, DO   300 mg at 10/21/17 2257  . magnesium hydroxide (MILK OF MAGNESIA) suspension 30 mL  30 mL Oral Daily Regalado, Belkys A, MD      . magnesium oxide (MAG-OX) tablet 400 mg  400 mg Per Tube BID Regalado, Belkys A, MD   400 mg at 10/22/17 1126  . megestrol (MEGACE) 400 MG/10ML suspension 400 mg  400 mg Oral Daily Samuella Cota, MD   400 mg at  10/22/17 1125  . milk and molasses enema  1 enema Rectal Daily PRN Regalado, Belkys A, MD      . mirtazapine (REMERON SOL-TAB) disintegrating tablet 45 mg  45 mg Oral QHS Samuella Cota, MD   45 mg at 10/21/17 2301  . multivitamin liquid 15 mL  15 mL Oral Daily Skeet Simmer, RPH   15 mL at 10/22/17 1113  . naphazoline-glycerin (CLEAR EYES REDNESS) ophth solution 2 drop  2 drop Both Eyes QID PRN Samuella Cota, MD      . OLANZapine zydis (ZYPREXA) disintegrating tablet 10 mg  10 mg Oral QHS Buford Dresser J, DO   10 mg at 10/21/17 2257  . ondansetron (ZOFRAN) tablet 4 mg  4 mg Oral Q6H PRN Samuella Cota, MD       Or  . ondansetron [facility]) injection 4 mg  4 mg Intravenous Q6H PRN Samuella Cota, MD      . polyethylene glycol (MIRALAX / GLYCOLAX) packet 17 g  17 g Oral BID Samuella Cota, MD   17 g at 10/22/17 1117  . potassium & sodium phosphates (PHOS-NAK) 280-160-250 MG packet 2 packet  2 packet Oral BID Skeet Simmer, [facility]   2 packet at 10/22/17 1116  . sennosides (SENOKOT) 8.8 MG/5ML syrup 5 mL  5 mL Oral QHS Skeet Simmer, RPH   5 mL at 10/21/17 2259  . simvastatin (ZOCOR) tablet 10 mg  10 mg Oral q1800 Samuella Cota, MD   10 mg at 10/21/17 1743  . sodium chloride flush (NS) 0.9 % injection 3 mL  3 mL Intravenous Q12H Samuella Cota, MD   3 mL at 10/22/17 1034  . thiamine (B-1) injection 100 mg  100 mg Intravenous Daily Regalado, Belkys A, MD        Musculoskeletal: Strength & Muscle Tone: not tested Gait & Station: unable to stand Patient leans: N/A  Psychiatric Specialty Exam: Physical Exam  ROS  Blood pressure 136/68, pulse (!) 105, temperature 98.1 F (36.7 C), temperature source Axillary, resp. rate 16, height 5' 2" (1.575 m), weight 41.1 kg, SpO2 97 %.Body mass index is 16.57 kg/m.  General Appearance: Casual  Eye Contact:  Minimal  Speech:  Slow  Volume:  Decreased  Mood:  Depressed and Dysphoric  Affect:  Constricted  Thought  Process:  Coherent  Orientation:  Full (Time, Place, and Person)  Thought Content:  Logical  Suicidal Thoughts:  No  Homicidal Thoughts:  No  Memory:  Immediate;   Fair Recent;   Fair Remote;   Fair  Judgement:  Fair  Insight:  Fair  Psychomotor Activity:  Psychomotor Retardation  Concentration:  Concentration: Fair and Attention Span: Fair  Recall:  [employer] of Knowledge:  Fair  Language:  Fair  Akathisia:  No  Handed:  Right  AIMS (if indicated):     Assets:  Communication Skills Desire for Improvement  ADL's: impaired due to medical and mental condition  Cognition:  WNL  Sleep:   fair     Treatment Plan Summary: 67 y.o. female who was admitted with confusion, agitation and found to have AKI secondary to poor PO intake. Patient reports ongoing depressive symptoms but worry more about her inability to eat with significant weight loss. Patient is currently on more than enough medications to treat depression. Please note that patient was re-assessed by a psychiatrist on 10/20/17 who placed patient on additional medications for depression. It takes 2-6 weeks of regularly  taking a psychiatrist medications before it kicks in and it is not advisable to change antidepressant every other day if there is no adverse reactions to it.  Recommendations: -Consider placing Peg tube if patient is not getting enough calorie through the feeding tube. -Optimize treatment of underlying medical issues causing patient to be depressed. -Continue 1:1 sitter for safety -Patient will benefit from inpatient psychiatric hospitalization if still severely depressed after medical issues such as malnutrition are resolved.  -Continue bedside sitter.  -Continue  Lithium 300 mg qhs for refractory depression/anxiety. -Continue Zyprexa 10 mg qhs for mood stabilization and anxiety.  -Continue home medications:Luvox 150 mg qhsfor depression and anxiety and Remeron 45 mg qhsfor depression and  anxiety. -Recommend repeating EKG to monitor  QTc prolonging due to patient taking so many medications that can cause QTc prolongation. -Psychiatric service signing out. Re-consult as needed   Disposition: Recommend psychiatric Inpatient admission when medically cleared.  Corena Pilgrim, MD 10/22/2017 12:52 PM

## 2017-10-22 NOTE — Progress Notes (Addendum)
PROGRESS NOTE    Amanda Hall  WUJ:[id number] DOB: 1950-01-21 DOA: 10/05/2017 PCP: [name], MD    Brief Narrative:  67 year old woman PMH depression, anxiety, recent inpatient psychiatric admission, presented with increased confusion, agitation, refusal to take medications, not eating, not drinking.  Admitted for acute kidney injury, acute encephalopathy.   Assessment & Plan:   Principal Problem:   MDD (major depressive disorder), recurrent severe, without psychosis ([facility]) Active Problems:   MDD (major depressive disorder)   Anorexia   Dysphagia   Severe malnutrition ([facility])   Anxiety  Depression severe, anxiety;  Continue with Zyprexa, luvox, Remeron Dr Nelva Bush will follow on patient once a week.  She has had levels check for TSH, HIV, Ammonia level within normal limits.  Appreciate Dr Nelva Bush help. effexor discontinue. Patient will be started on lithium.  Require inpatient psychiatric admission.   Severe malnutrition; underweight.  cortrak was placed after discussion with sister.  On tube feeding. Started 10-07 Monitor for refeeding syndrome.  Replete phosphorus.  On remeron.  B 12 438 and vitamin d pending.  Check vitamin B 1 level.  On Thiamine IV  Electrolytes stable.  Check cortisol level.   Dysphagia. --Continue dysphagia 1 diet, thin liquids, medications crushed with pure  Acute kidney injury, dehydration, hypernatremia secondary to refusing to eat and drink secondary to underlying psychiatric disorder --Resolved with hydration.   -resume IV fluids.   Acute encephalopathy with agitation, confusion on admission, secondary to anxiety, depression.  No evidence of infection.  Metabolic derangement corrected..  IVC paperwork completed in the emergency department.  Head CT negative for acute findings. --Continue care as above  SIRS.  Afebrile but had mild leukocytosis, tachycardia and elevated lactic acid on admission.  Urinalysis and chest x-ray without  evidence of infection.  Thought secondary to dehydration. --Resolved  Constipation; suppository ordered. Patient refuse   DVT prophylaxis: lovenox Code Status: full code.  Family Communication:  No family at bedside.  Disposition Plan: awaiting psychiatric facility   Consultants:   Psych     Procedures:  none  Antimicrobials:   none   Subjective: Sleepy, wake up answer few questions.    Objective: Vitals:   10/22/17 0003 10/22/17 0442 10/22/17 0942 10/22/17 1256  BP: 125/72 (!) 141/78 136/68 115/70  Pulse: (!) 101 (!) 109 (!) 105 100  Resp: 16 20 16 18   Temp: 97.7 F (36.5 C) 97.8 F (36.6 C) 98.1 F (36.7 C) 98.2 F (36.8 C)  TempSrc: Oral Oral Axillary Oral  SpO2: 98% 99% 97% 98%  Weight:      Height:        Intake/Output Summary (Last 24 hours) at 10/22/2017 1345 Last data filed at 10/22/2017 1306 Gross per 24 hour  Intake 1213 ml  Output 400 ml  Net 813 ml   Filed Weights   10/16/17 2038 10/17/17 0358 10/18/17 0603  Weight: 41.4 kg 40.7 kg 41.1 kg    Examination:  General exam: cachetic, flat affect Respiratory system: CTA Cardiovascular system: S 1, S 2 RRR Gastrointestinal system: BS present, soft, nt Central nervous system: sleepy  Extremities; Moves extremities,  Skin: No rashes.  Psychiatry: flat affect   Data Reviewed: I have personally reviewed following labs and imaging studies  CBC: Recent Labs  Lab 10/20/17 0552 10/22/17 0629  WBC 6.2 6.2  HGB 13.1 12.4  HCT 40.9 37.8  MCV 91.7 91.1  PLT 288 304   Basic Metabolic Panel: Recent Labs  Lab 10/18/17 0352 10/19/17 0439 10/20/17 9562  10/21/17 0511 10/21/17 0757 10/22/17 0629  NA 141 138 140  --  138 139  K 3.5 3.6 4.0  --  4.1 3.9  CL 107 103 104  --  104 102  CO2 27 30 30   --  27 29  GLUCOSE 134* 132* 93  --  141* 139*  BUN 27* 27* 26*  --  28* 25*  CREATININE 0.89 0.81 0.83  --  0.81 0.84  CALCIUM 9.7 9.8 9.6  --  9.1 9.8  MG 2.2 2.3 2.2 2.0  --  2.0  PHOS  2.7 2.2* 2.5 2.4*  --  2.5   GFR: Estimated Creatinine Clearance: 42.7 mL/min (by C-G formula based on SCr of 0.84 mg/dL). Liver Function Tests: No results for input(s): AST, ALT, ALKPHOS, BILITOT, PROT, ALBUMIN in the last 168 hours. No results for input(s): LIPASE, AMYLASE in the last 168 hours. No results for input(s): AMMONIA in the last 168 hours. Coagulation Profile: No results for input(s): INR, PROTIME in the last 168 hours. Cardiac Enzymes: No results for input(s): CKTOTAL, CKMB, CKMBINDEX, TROPONINI in the last 168 hours. BNP (last 3 results) No results for input(s): PROBNP in the last 8760 hours. HbA1C: No results for input(s): HGBA1C in the last 72 hours. CBG: Recent Labs  Lab 10/21/17 1646 10/21/17 2132 10/22/17 0013 10/22/17 0440 10/22/17 1252  GLUCAP 134* 141* 132* 141* 145*   Lipid Profile: No results for input(s): CHOL, HDL, LDLCALC, TRIG, CHOLHDL, LDLDIRECT in the last 72 hours. Thyroid Function Tests: No results for input(s): TSH, T4TOTAL, FREET4, T3FREE, THYROIDAB in the last 72 hours. Anemia Panel: Recent Labs    10/20/17 0552  VITAMINB12 438   Sepsis Labs: No results for input(s): PROCALCITON, LATICACIDVEN in the last 168 hours.  No results found for this or any previous visit (from the past 240 hour(s)).       Radiology Studies: No results found.      Scheduled Meds: . bisacodyl  10 mg Rectal Once  . enoxaparin (LOVENOX) injection  30 mg Subcutaneous Q24H  . feeding supplement (ENSURE ENLIVE)  237 mL Oral TID BM  . fluvoxaMINE  150 mg Oral QHS  . lithium carbonate  300 mg Oral QHS  . magnesium hydroxide  30 mL Oral Daily  . magnesium oxide  400 mg Per Tube BID  . megestrol  400 mg Oral Daily  . mirtazapine  45 mg Oral QHS  . multivitamin  15 mL Oral Daily  . OLANZapine zydis  10 mg Oral QHS  . polyethylene glycol  17 g Oral BID  . potassium & sodium phosphates  2 packet Oral BID  . sennosides  5 mL Oral QHS  . simvastatin  10  mg Oral q1800  . sodium chloride flush  3 mL Intravenous Q12H  . thiamine injection  100 mg Intravenous Daily   Continuous Infusions: . sodium chloride 75 mL/hr at 10/22/17 1033  . feeding supplement (OSMOLITE 1.2 CAL) 1,000 mL (10/22/17 1110)     LOS: 17 days    Time spent: 35 minutes.     Alba Cory, MD [facility] Pager [phone]  If 7PM-7AM, please contact night-coverage [email or web address] Password [email or web address] 10/22/2017, 1:45 PM

## 2017-10-23 LAB — CBC
HCT: 36.6 % (ref 36.0–46.0)
Hemoglobin: 11.2 g/dL — ABNORMAL LOW (ref 12.0–15.0)
MCH: 28.5 pg (ref 26.0–34.0)
MCHC: 30.6 g/dL (ref 30.0–36.0)
MCV: 93.1 fL (ref 80.0–100.0)
Platelets: 268 K/uL (ref 150–400)
RBC: 3.93 MIL/uL (ref 3.87–5.11)
RDW: 14.3 % (ref 11.5–15.5)
WBC: 5.8 K/uL (ref 4.0–10.5)
nRBC: 0.3 % — ABNORMAL HIGH (ref 0.0–0.2)

## 2017-10-23 LAB — MAGNESIUM: MAGNESIUM: 2.1 mg/dL (ref 1.7–2.4)

## 2017-10-23 LAB — BASIC METABOLIC PANEL WITH GFR
Anion gap: 6 (ref 5–15)
BUN: 18 mg/dL (ref 8–23)
CO2: 28 mmol/L (ref 22–32)
Calcium: 9.8 mg/dL (ref 8.9–10.3)
Chloride: 107 mmol/L (ref 98–111)
Creatinine, Ser: 0.76 mg/dL (ref 0.44–1.00)
GFR calc Af Amer: >60 mL/min
GFR calc non Af Amer: >60 mL/min
Glucose, Bld: 116 mg/dL — ABNORMAL HIGH (ref 70–99)
Potassium: 4.6 mmol/L (ref 3.5–5.1)
Sodium: 141 mmol/L (ref 135–145)

## 2017-10-23 LAB — URINALYSIS, ROUTINE W REFLEX MICROSCOPIC
Bilirubin Urine: NEGATIVE
Glucose, UA: NEGATIVE mg/dL
Ketones, ur: NEGATIVE mg/dL
Nitrite: NEGATIVE
Protein, ur: NEGATIVE mg/dL
Specific Gravity, Urine: 1.009 (ref 1.005–1.030)
pH: 9 — ABNORMAL HIGH (ref 5.0–8.0)

## 2017-10-23 LAB — PHOSPHORUS: Phosphorus: 2.9 mg/dL (ref 2.5–4.6)

## 2017-10-23 LAB — CORTISOL-AM, BLOOD: Cortisol - AM: 4.6 ug/dL — ABNORMAL LOW (ref 6.7–22.6)

## 2017-10-23 LAB — GLUCOSE, CAPILLARY
GLUCOSE-CAPILLARY: 122 mg/dL — AB (ref 70–99)
Glucose-Capillary: 129 mg/dL — ABNORMAL HIGH (ref 70–99)
Glucose-Capillary: 123 mg/dL — ABNORMAL HIGH (ref 70–99)
Glucose-Capillary: 122 mg/dL — ABNORMAL HIGH (ref 70–99)

## 2017-10-23 MED ORDER — POLYETHYLENE GLYCOL 3350 17 G PO PACK
17.0000 g | PACK | Freq: Two times a day (BID) | ORAL | Status: DC
  Administered 2017-10-24 – 2017-12-15 (×59): 17 g
  Filled 2017-10-23 (×70): qty 1

## 2017-10-23 MED ORDER — ONDANSETRON HCL 4 MG/2ML IJ SOLN
4.0000 mg | Freq: Four times a day (QID) | INTRAMUSCULAR | Status: DC | PRN
  Administered 2017-11-15: 4 mg via INTRAVENOUS
  Filled 2017-10-23: qty 2

## 2017-10-23 MED ORDER — MAGNESIUM HYDROXIDE 400 MG/5ML PO SUSP
30.0000 mL | Freq: Every day | ORAL | Status: DC
  Administered 2017-10-25 – 2017-12-15 (×42): 30 mL
  Filled 2017-10-23 (×45): qty 30

## 2017-10-23 MED ORDER — ENSURE ENLIVE PO LIQD
237.0000 mL | Freq: Three times a day (TID) | ORAL | Status: DC
  Administered 2017-10-24 – 2017-10-26 (×5): 237 mL

## 2017-10-23 MED ORDER — COSYNTROPIN 0.25 MG IJ SOLR
0.2500 mg | Freq: Once | INTRAMUSCULAR | Status: AC
  Administered 2017-10-24: 0.25 mg via INTRAVENOUS
  Filled 2017-10-23: qty 0.25

## 2017-10-23 MED ORDER — MIRTAZAPINE 30 MG PO TBDP
45.0000 mg | ORAL_TABLET | Freq: Every day | ORAL | Status: DC
  Administered 2017-10-23 – 2018-01-01 (×71): 45 mg
  Filled 2017-10-23 (×71): qty 1

## 2017-10-23 MED ORDER — OLANZAPINE 5 MG PO TBDP
10.0000 mg | ORAL_TABLET | Freq: Every day | ORAL | Status: DC
  Administered 2017-10-23 – 2017-11-16 (×25): 10 mg via SUBLINGUAL
  Filled 2017-10-23 (×26): qty 2

## 2017-10-23 MED ORDER — WHITE PETROLATUM EX OINT
TOPICAL_OINTMENT | CUTANEOUS | Status: AC
  Administered 2017-10-23: 0.2
  Filled 2017-10-23: qty 28.35

## 2017-10-23 MED ORDER — SIMVASTATIN 20 MG PO TABS
10.0000 mg | ORAL_TABLET | Freq: Every day | ORAL | Status: DC
  Administered 2017-10-24 – 2017-12-26 (×62): 10 mg
  Filled 2017-10-23 (×62): qty 1

## 2017-10-23 MED ORDER — SENNOSIDES 8.8 MG/5ML PO SYRP
5.0000 mL | ORAL_SOLUTION | Freq: Every day | ORAL | Status: DC
  Administered 2017-10-24 – 2018-01-01 (×54): 5 mL
  Filled 2017-10-23 (×71): qty 5

## 2017-10-23 MED ORDER — ONDANSETRON HCL 4 MG PO TABS: 4.0000 mg | ORAL_TABLET | Freq: Four times a day (QID) | ORAL | Status: DC | PRN

## 2017-10-23 MED ORDER — SODIUM CHLORIDE 0.9 % IV SOLN
1.0000 g | Freq: Every day | INTRAVENOUS | Status: DC
  Administered 2017-10-23 – 2017-10-26 (×4): 1 g via INTRAVENOUS
  Filled 2017-10-23 (×4): qty 10

## 2017-10-23 MED ORDER — MEGESTROL ACETATE 400 MG/10ML PO SUSP
400.0000 mg | Freq: Every day | ORAL | Status: DC
  Administered 2017-10-24 – 2017-12-27 (×63): 400 mg
  Filled 2017-10-23 (×66): qty 10

## 2017-10-23 MED ORDER — FLUVOXAMINE MALEATE 50 MG PO TABS
150.0000 mg | ORAL_TABLET | Freq: Every day | ORAL | Status: DC
  Administered 2017-10-23 – 2018-01-01 (×71): 150 mg
  Filled 2017-10-23 (×72): qty 1

## 2017-10-23 NOTE — Progress Notes (Signed)
PROGRESS NOTE    Amanda Hall  RUE:[id number] DOB: [birth year] DOA: 10/05/2017 PCP: [name], MD    Brief Narrative:  67 year old woman PMH depression, anxiety, recent inpatient psychiatric admission, presented with increased confusion, agitation, refusal to take medications, not eating, not drinking.  Admitted for acute kidney injury, acute encephalopathy.   Assessment & Plan:   Principal Problem:   MDD (major depressive disorder), recurrent severe, without psychosis (HCC) Active Problems:   MDD (major depressive disorder)   Anorexia   Dysphagia   Severe malnutrition (HCC)   Anxiety  Depression severe, anxiety;  Continue with Zyprexa, luvox, Remeron Dr Nelva Bush will follow on patient once a week.  She has had levels check for TSH, HIV, Ammonia level within normal limits.  Appreciate Dr Nelva Bush help. effexor discontinue. Patient will be started on lithium.  Require inpatient psychiatric admission.   UTI;  UA with 21-50 WBC.  Follow urine culture.  Start IV ceftriaxone.   Severe malnutrition; underweight.  cortrak was placed after discussion with sister.  On tube feeding. Started 10-07 Monitor for refeeding syndrome.  Replete phosphorus.  On remeron.  B 12 438 and vitamin d pending.  Check vitamin B 1 level.  On Thiamine IV  Electrolytes stable.  Cortisol low, will do ACTH test.   Dysphagia. --Continue dysphagia 1 diet, thin liquids, medications crushed with pure  Acute kidney injury, dehydration, hypernatremia secondary to refusing to eat and drink secondary to underlying psychiatric disorder --Resolved with hydration.   -resume IV fluids.   Acute encephalopathy with agitation, confusion on admission, secondary to anxiety, depression.  No evidence of infection.  Metabolic derangement corrected..  IVC paperwork completed in the emergency department.  Head CT negative for acute findings. --Continue care as above  SIRS.  Afebrile but had mild leukocytosis,  tachycardia and elevated lactic acid on admission.  Urinalysis and chest x-ray without evidence of infection.  Thought secondary to dehydration. --Resolved  Constipation; had large BM 10-12 per nurse   DVT prophylaxis: lovenox Code Status: full code.  Family Communication:  No family at bedside.  Disposition Plan: awaiting psychiatric facility   Consultants:   Psych     Procedures:  none  Antimicrobials:   none   Subjective: Sleepy, wake up answer few questions.    Objective: Vitals:   10/22/17 2114 10/23/17 0500 10/23/17 0643 10/23/17 1005  BP: 121/77  (!) 123/58 119/78  Pulse: 98  82 (!) 106  Resp: 14  (!) 6 17  Temp: 97.6 F (36.4 C)  98.1 F (36.7 C)   TempSrc: Oral  Oral   SpO2: 100%   100%  Weight:  41.1 kg    Height:        Intake/Output Summary (Last 24 hours) at 10/23/2017 1207 Last data filed at 10/23/2017 0900 Gross per 24 hour  Intake 408.38 ml  Output 300 ml  Net 108.38 ml   Filed Weights   10/17/17 0358 10/18/17 0603 10/23/17 0500  Weight: 40.7 kg 41.1 kg 41.1 kg    Examination:  General exam: Cachetic, flat affect.  Respiratory system: CTA Cardiovascular system: S 1, S 2  Gastrointestinal system: BS present, soft.  Central nervous system: alert.  Extremities; moves extremities.   Skin: No rashes.  Psychiatry: flat affect  Data Reviewed: I have personally reviewed following labs and imaging studies  CBC: Recent Labs  Lab 10/20/17 0552 10/22/17 0629 10/23/17 0648  WBC 6.2 6.2 5.8  HGB 13.1 12.4 11.2*  HCT 40.9 37.8 36.6  MCV  91.7 91.1 93.1  PLT 288 304 268   Basic Metabolic Panel: Recent Labs  Lab 10/19/17 0439 10/20/17 0552 10/21/17 0511 10/21/17 0757 10/22/17 0629 10/23/17 0648  NA 138 140  --  138 139 141  K 3.6 4.0  --  4.1 3.9 4.6  CL 103 104  --  104 102 107  CO2 30 30  --  27 29 28   GLUCOSE 132* 93  --  141* 139* 116*  BUN 27* 26*  --  28* 25* 18  CREATININE 0.81 0.83  --  0.81 0.84 0.76  CALCIUM 9.8  9.6  --  9.1 9.8 9.8  MG 2.3 2.2 2.0  --  2.0 2.1  PHOS 2.2* 2.5 2.4*  --  2.5 2.9   GFR: Estimated Creatinine Clearance: 44.9 mL/min (by C-G formula based on SCr of 0.76 mg/dL). Liver Function Tests: No results for input(s): AST, ALT, ALKPHOS, BILITOT, PROT, ALBUMIN in the last 168 hours. No results for input(s): LIPASE, AMYLASE in the last 168 hours. No results for input(s): AMMONIA in the last 168 hours. Coagulation Profile: No results for input(s): INR, PROTIME in the last 168 hours. Cardiac Enzymes: No results for input(s): CKTOTAL, CKMB, CKMBINDEX, TROPONINI in the last 168 hours. BNP (last 3 results) No results for input(s): PROBNP in the last 8760 hours. HbA1C: No results for input(s): HGBA1C in the last 72 hours. CBG: Recent Labs  Lab 10/22/17 0440 10/22/17 1252 10/22/17 1722 10/22/17 2118 10/23/17 1009  GLUCAP 141* 145* 124* 121* 129*   Lipid Profile: No results for input(s): CHOL, HDL, LDLCALC, TRIG, CHOLHDL, LDLDIRECT in the last 72 hours. Thyroid Function Tests: No results for input(s): TSH, T4TOTAL, FREET4, T3FREE, THYROIDAB in the last 72 hours. Anemia Panel: No results for input(s): VITAMINB12, FOLATE, FERRITIN, TIBC, IRON, RETICCTPCT in the last 72 hours. Sepsis Labs: No results for input(s): PROCALCITON, LATICACIDVEN in the last 168 hours.  No results found for this or any previous visit (from the past 240 hour(s)).       Radiology Studies: No results found.      Scheduled Meds: . bisacodyl  10 mg Rectal Once  . enoxaparin (LOVENOX) injection  30 mg Subcutaneous Q24H  . feeding supplement (ENSURE ENLIVE)  237 mL Oral TID BM  . fluvoxaMINE  150 mg Oral QHS  . lithium carbonate  300 mg Oral QHS  . magnesium hydroxide  30 mL Oral Daily  . magnesium oxide  400 mg Per Tube BID  . megestrol  400 mg Oral Daily  . mirtazapine  45 mg Oral QHS  . multivitamin  15 mL Oral Daily  . OLANZapine zydis  10 mg Oral QHS  . polyethylene glycol  17 g Oral  BID  . potassium & sodium phosphates  2 packet Oral BID  . sennosides  5 mL Oral QHS  . simvastatin  10 mg Oral q1800  . sodium chloride flush  3 mL Intravenous Q12H  . thiamine injection  100 mg Intravenous Daily   Continuous Infusions: . sodium chloride 75 mL/hr at 10/22/17 1600  . cefTRIAXone (ROCEPHIN)  IV 1 g (10/23/17 0918)  . feeding supplement (OSMOLITE 1.2 CAL) 1,000 mL (10/23/17 0631)     LOS: 18 days    Time spent: 35 minutes.     Alba Cory, MD [facility] Pager [phone]  If 7PM-7AM, please contact night-coverage [email or web address] Password [email or web address] 10/23/2017, 12:07 PM

## 2017-10-24 LAB — CBC
HCT: 37.5 % (ref 36.0–46.0)
Hemoglobin: 11.9 g/dL — ABNORMAL LOW (ref 12.0–15.0)
MCH: 29.1 pg (ref 26.0–34.0)
MCHC: 31.7 g/dL (ref 30.0–36.0)
MCV: 91.7 fL (ref 80.0–100.0)
PLATELETS: 286 10*3/uL (ref 150–400)
RBC: 4.09 MIL/uL (ref 3.87–5.11)
RDW: 14.1 % (ref 11.5–15.5)
WBC: 5.6 10*3/uL (ref 4.0–10.5)
nRBC: 0 % (ref 0.0–0.2)

## 2017-10-24 LAB — BASIC METABOLIC PANEL
Anion gap: 4 — ABNORMAL LOW (ref 5–15)
BUN: 16 mg/dL (ref 8–23)
CHLORIDE: 109 mmol/L (ref 98–111)
CO2: 29 mmol/L (ref 22–32)
Calcium: 9.9 mg/dL (ref 8.9–10.3)
Creatinine, Ser: 0.67 mg/dL (ref 0.44–1.00)
GFR calc Af Amer: >60 mL/min (ref >60)
GLUCOSE: 124 mg/dL — AB (ref 70–99)
POTASSIUM: 4.5 mmol/L (ref 3.5–5.1)
SODIUM: 142 mmol/L (ref 135–145)

## 2017-10-24 LAB — PHOSPHORUS: PHOSPHORUS: 2.9 mg/dL (ref 2.5–4.6)

## 2017-10-24 LAB — GLUCOSE, CAPILLARY
GLUCOSE-CAPILLARY: 118 mg/dL — AB (ref 70–99)
GLUCOSE-CAPILLARY: 150 mg/dL — AB (ref 70–99)
GLUCOSE-CAPILLARY: 136 mg/dL — AB (ref 70–99)
GLUCOSE-CAPILLARY: 164 mg/dL — AB (ref 70–99)

## 2017-10-24 LAB — ACTH STIMULATION, 3 TIME POINTS
CORTISOL 30 MIN: 23.2 ug/dL
CORTISOL 60 MIN: 27.1 ug/dL
Cortisol, Base: 6.4 ug/dL

## 2017-10-24 LAB — MAGNESIUM: MAGNESIUM: 2.1 mg/dL (ref 1.7–2.4)

## 2017-10-24 LAB — CORTISOL: Cortisol, Plasma: 6.3 ug/dL

## 2017-10-24 NOTE — Progress Notes (Signed)
Moved Cortisol to 0800 due to lab stating lab could not be drawn at this time prior to medication being given. Lab stated could come at 0700. Cortisol level has to be drawn prior to admin. and 30 min. after, per order.   Larey Days, RN

## 2017-10-24 NOTE — Progress Notes (Signed)
PROGRESS NOTE    Amanda Hall  UJW:[id number] DOB: 1950-07-08 DOA: 10/05/2017 PCP: [name], MD    Brief Narrative:  67 year old woman PMH depression, anxiety, recent inpatient psychiatric admission, presented with increased confusion, agitation, refusal to take medications, not eating, not drinking.  Admitted for acute kidney injury, acute encephalopathy.   Assessment & Plan:   Principal Problem:   MDD (major depressive disorder), recurrent severe, without psychosis (HCC) Active Problems:   MDD (major depressive disorder)   Anorexia   Dysphagia   Severe malnutrition (HCC)   Anxiety  Depression severe, anxiety;  Continue with Zyprexa, luvox, Remeron Dr Nelva Bush will follow on patient once a week.  She has had levels check for TSH, HIV, Ammonia level within normal limits.  Appreciate Dr Nelva Bush help. effexor discontinue. Patient will be started on lithium.  Require inpatient psychiatric admission.   UTI;  UA with 21-50 WBC.  Follow urine culture.  Started  IV ceftriaxone. Day 2.   Severe malnutrition; underweight.  cortrak was placed after discussion with sister.  On tube feeding. Started 10-07 Monitor for refeeding syndrome.  Replete phosphorus.  On remeron.  B 12 438 and vitamin d pending.  Check vitamin B 1 level.  On Thiamine IV  Electrolytes stable.  Cortisol low, ACTH; cortisol with normal respond rule out adrenal insufficiency.   Dysphagia. --Continue dysphagia 1 diet, thin liquids, medications crushed with pure  Acute kidney injury, dehydration, hypernatremia secondary to refusing to eat and drink secondary to underlying psychiatric disorder --Resolved with hydration.   -resume IV fluids.   Acute encephalopathy with agitation, confusion on admission, secondary to anxiety, depression.  No evidence of infection.  Metabolic derangement corrected..  IVC paperwork completed in the emergency department.  Head CT negative for acute findings. --Continue  care as above  SIRS.  Afebrile but had mild leukocytosis, tachycardia and elevated lactic acid on admission.  Urinalysis and chest x-ray without evidence of infection.  Thought secondary to dehydration. --Resolved  Constipation; had large BM 10-12 per nurse   DVT prophylaxis: lovenox Code Status: full code.  Family Communication:  No family at bedside.  Disposition Plan: awaiting psychiatric facility   Consultants:   Psych     Procedures:  none  Antimicrobials:   none   Subjective: Alert, anxious.    Objective: Vitals:   10/23/17 1716 10/23/17 2145 10/24/17 0143 10/24/17 1101  BP: (!) 141/97 109/88  136/84  Pulse: (!) 116 (!) 132  (!) 108  Resp: 18 18  19   Temp: 98.6 F (37 C) 98 F (36.7 C)  97.7 F (36.5 C)  TempSrc: Oral Axillary  Oral  SpO2: 98% 100%  99%  Weight:   41.1 kg   Height:        Intake/Output Summary (Last 24 hours) at 10/24/2017 1330 Last data filed at 10/24/2017 0657 Gross per 24 hour  Intake 5650.61 ml  Output 800 ml  Net 4850.61 ml   Filed Weights   10/18/17 0603 10/23/17 0500 10/24/17 0143  Weight: 41.1 kg 41.1 kg 41.1 kg    Examination:  General exam: Cachetic, flat affect  Respiratory system: CTA Cardiovascular system: S 1, S 2 RRR Gastrointestinal system: BS present, soft, nt Central nervous system: Alert.  Extremities; Moves extremities.  Skin: No rashes.  Psychiatry: flat affect  Data Reviewed: I have personally reviewed following labs and imaging studies  CBC: Recent Labs  Lab 10/20/17 0552 10/22/17 0629 10/23/17 0648 10/24/17 0834  WBC 6.2 6.2 5.8 5.6  HGB  13.1 12.4 11.2* 11.9*  HCT 40.9 37.8 36.6 37.5  MCV 91.7 91.1 93.1 91.7  PLT 288 304 268 286   Basic Metabolic Panel: Recent Labs  Lab 10/20/17 0552 10/21/17 0511 10/21/17 0757 10/22/17 0629 10/23/17 0648 10/24/17 0834  NA 140  --  138 139 141 142  K 4.0  --  4.1 3.9 4.6 4.5  CL 104  --  104 102 107 109  CO2 30  --  27 29 28 29   GLUCOSE  93  --  141* 139* 116* 124*  BUN 26*  --  28* 25* 18 16  CREATININE 0.83  --  0.81 0.84 0.76 0.67  CALCIUM 9.6  --  9.1 9.8 9.8 9.9  MG 2.2 2.0  --  2.0 2.1 2.1  PHOS 2.5 2.4*  --  2.5 2.9 2.9   GFR: Estimated Creatinine Clearance: 44.9 mL/min (by C-G formula based on SCr of 0.67 mg/dL). Liver Function Tests: No results for input(s): AST, ALT, ALKPHOS, BILITOT, PROT, ALBUMIN in the last 168 hours. No results for input(s): LIPASE, AMYLASE in the last 168 hours. No results for input(s): AMMONIA in the last 168 hours. Coagulation Profile: No results for input(s): INR, PROTIME in the last 168 hours. Cardiac Enzymes: No results for input(s): CKTOTAL, CKMB, CKMBINDEX, TROPONINI in the last 168 hours. BNP (last 3 results) No results for input(s): PROBNP in the last 8760 hours. HbA1C: No results for input(s): HGBA1C in the last 72 hours. CBG: Recent Labs  Lab 10/23/17 1209 10/23/17 1715 10/23/17 2143 10/24/17 0832 10/24/17 1203  GLUCAP 123* 122* 122* 118* 150*   Lipid Profile: No results for input(s): CHOL, HDL, LDLCALC, TRIG, CHOLHDL, LDLDIRECT in the last 72 hours. Thyroid Function Tests: No results for input(s): TSH, T4TOTAL, FREET4, T3FREE, THYROIDAB in the last 72 hours. Anemia Panel: No results for input(s): VITAMINB12, FOLATE, FERRITIN, TIBC, IRON, RETICCTPCT in the last 72 hours. Sepsis Labs: No results for input(s): PROCALCITON, LATICACIDVEN in the last 168 hours.  Recent Results (from the past 240 hour(s))  Urine Culture     Status: Abnormal (Preliminary result)   Collection Time: 10/23/17  9:13 AM  Result Value Ref Range Status   Specimen Description URINE, RANDOM  Final   Special Requests NONE  Final   Culture (A)  Final    >=100,000 COLONIES/mL UNIDENTIFIED ORGANISM Performed at [facility], [address], [place], [place] [address]    Report Status PENDING  Incomplete         Radiology Studies: No results found.      Scheduled Meds: .  bisacodyl  10 mg Rectal Once  . enoxaparin (LOVENOX) injection  30 mg Subcutaneous Q24H  . feeding supplement (ENSURE ENLIVE)  237 mL Per Tube TID BM  . fluvoxaMINE  150 mg Per Tube QHS  . lithium carbonate  300 mg Oral QHS  . magnesium hydroxide  30 mL Per Tube Daily  . magnesium oxide  400 mg Per Tube BID  . megestrol  400 mg Per Tube Daily  . mirtazapine  45 mg Per Tube QHS  . multivitamin  15 mL Oral Daily  . OLANZapine zydis  10 mg Sublingual QHS  . polyethylene glycol  17 g Per Tube BID  . potassium & sodium phosphates  2 packet Oral BID  . sennosides  5 mL Per Tube QHS  . simvastatin  10 mg Per Tube q1800  . sodium chloride flush  3 mL Intravenous Q12H  . thiamine injection  100 mg Intravenous Daily   Continuous Infusions: . sodium chloride 75 mL/hr at 10/24/17 0316  . cefTRIAXone (ROCEPHIN)  IV 1 g (10/24/17 1145)  . feeding supplement (OSMOLITE 1.2 CAL) 1,000 mL (10/24/17 0318)     LOS: 19 days    Time spent: 35 minutes.     Alba Cory, MD [facility] Pager [phone]  If 7PM-7AM, please contact night-coverage [email or web address] Password [email or web address] 10/24/2017, 1:30 PM

## 2017-10-24 NOTE — Clinical Social Work Note (Signed)
Patient remains IVC'd and IVC paperwork updated today and placed in patient's chart. Received "Findings and Custody Order Involuntary Commitment" paperwork from [employer] office (via fax) and placed in patient's chart along with IV-C paperwork.   CSW talked with Chrissie Noa in admissions at [facility] [phone]) regarding patient and they do have paperwork that was faxed on 10/11. MD did not state that they could or could not take Cortraks or feeding tubes but requested that the procedure for requesting a bed for patient's be followed. CSW explained to Chrissie Noa why clnicals were sent (at [facility] request) on the 11th and advised Chrissie Noa that the [facility] application will be completed and faxed to them. Chrissie Noa requested and was sent most recent psych note. [facility] application completed, local LME contacted and authorization received: [id number], effective 10/14 - 10/30/17. [facility] application and applicable medical information faxed to admissions staff person Chrissie Noa at [facility]. CSW received call form Chrissie Noa and he was informed that [facility] application sent. He will review and get back with this CSW.  Genelle Bal, MSW, LCSW Licensed Clinical Social Worker Clinical Social Work Department [employer] [phone]

## 2017-10-25 LAB — URINE CULTURE: Culture: >=100000 — AB

## 2017-10-25 LAB — LITHIUM LEVEL: Lithium Lvl: 0.3 mmol/L — ABNORMAL LOW (ref 0.60–1.20)

## 2017-10-25 LAB — BASIC METABOLIC PANEL
Anion gap: 7 (ref 5–15)
BUN: 20 mg/dL (ref 8–23)
CALCIUM: 10 mg/dL (ref 8.9–10.3)
CO2: 29 mmol/L (ref 22–32)
CREATININE: 0.69 mg/dL (ref 0.44–1.00)
Chloride: 106 mmol/L (ref 98–111)
GFR calc Af Amer: >60 mL/min (ref >60)
GFR calc non Af Amer: >60 mL/min (ref >60)
GLUCOSE: 107 mg/dL — AB (ref 70–99)
Potassium: 3.5 mmol/L (ref 3.5–5.1)
Sodium: 142 mmol/L (ref 135–145)

## 2017-10-25 LAB — GLUCOSE, CAPILLARY
GLUCOSE-CAPILLARY: 110 mg/dL — AB (ref 70–99)
Glucose-Capillary: 134 mg/dL — ABNORMAL HIGH (ref 70–99)
Glucose-Capillary: 129 mg/dL — ABNORMAL HIGH (ref 70–99)
Glucose-Capillary: 114 mg/dL — ABNORMAL HIGH (ref 70–99)

## 2017-10-25 LAB — PHOSPHORUS: Phosphorus: 2.4 mg/dL — ABNORMAL LOW (ref 2.5–4.6)

## 2017-10-25 LAB — VITAMIN B1: Vitamin B1 (Thiamine): 114.6 nmol/L (ref 66.5–200.0)

## 2017-10-25 MED ORDER — K PHOS MONO-SOD PHOS DI & MONO 155-852-130 MG PO TABS
500.0000 mg | ORAL_TABLET | Freq: Two times a day (BID) | ORAL | Status: DC
  Administered 2017-10-25 – 2017-10-26 (×2): 500 mg via ORAL
  Filled 2017-10-25 (×8): qty 2

## 2017-10-25 NOTE — Progress Notes (Signed)
Physical Therapy Treatment Patient Details Name: Amanda Hall MRN: [id number] DOB: July 08, 1950 Today's Date: 10/25/2017    History of Present Illness Pt is a 67 y/o female admitted secondary to major depressive disorder. Pt with increased confusion and agitation prior to admission. Found to have AKI and acute encephalopathy. CT negative for acute abnormality. PMH includes anxiety and depression.     PT Comments    Pt continues to be limited by anxiety, however tolerated transition to [facility] and then recliner fairly well. Continues to require +2 assist for OOB mobility mainly for safety, however with future gait training, will need +2 for physical assistance as well.  Will continue to follow and progress as able per POC.   Follow Up Recommendations  Supervision/Assistance - 24 hour;Other (comment)(inpatient psych)     Equipment Recommendations  None recommended by PT    Recommendations for Other Services       Precautions / Restrictions Precautions Precautions: Fall Precaution Comments: Recruitment consultant, cortrack tube Restrictions Weight Bearing Restrictions: No    Mobility  Bed Mobility Overal bed mobility: Needs Assistance Bed Mobility: Rolling;Sidelying to Sit Rolling: Min assist Sidelying to sit: HOB elevated;Mod assist       General bed mobility comments: Step-by-step cues to transition to EOB. Mod assist required for trunk elevation due to pt resisting initially.   Transfers Overall transfer level: Needs assistance Equipment used: 2 person hand held assist Transfers: Sit to/from [employer] Sit to Stand: Mod assist;+2 safety/equipment;+2 physical assistance Stand pivot transfers: Mod assist;+2 safety/equipment;+2 physical assistance       General transfer comment: Pt held to PT and tech's arms during power-up to full stand. Posterior lean noted to be improved today however pt overall continues to be so guarded that increased assist is required to  facilitate transfer.   Ambulation/Gait             General Gait Details: Deferred due to anxiety.   Stairs             Wheelchair Mobility    Modified Rankin (Stroke Patients Only)       Balance Overall balance assessment: Needs assistance Sitting-balance support: No upper extremity supported;Feet supported Sitting balance-Leahy Scale: Good     Standing balance support: During functional activity Standing balance-Leahy Scale: Poor Standing balance comment: Reliant on external support for standing balance. Posterior lean.                            Cognition Arousal/Alertness: Awake/alert Behavior During Therapy: Anxious Overall Cognitive Status: No family/caregiver present to determine baseline cognitive functioning                                 General Comments: Highly anxious. Answering some questions however also inappropriately responding at times      Exercises      General Comments General comments (skin integrity, edema, etc.): Sitter present throughout session and assisting with peri-care after BSC      Pertinent Vitals/Pain Pain Assessment: Faces Faces Pain Scale: Hurts a little bit Pain Location: grimacing but does not state location Pain Descriptors / Indicators: Grimacing Pain Intervention(s): Monitored during session;Repositioned    Home Living                      Prior Function            PT Goals (  current goals can now be found in the care plan section) Acute Rehab PT Goals Patient Stated Goal: did not state PT Goal Formulation: Patient unable to participate in goal setting Time For Goal Achievement: 11/01/17 Potential to Achieve Goals: Fair Progress towards PT goals: Progressing toward goals    Frequency    Min 2X/week      PT Plan Current plan remains appropriate    Co-evaluation              AM-PAC PT "6 Clicks" Daily Activity  Outcome Measure  Difficulty turning over  in bed (including adjusting bedclothes, sheets and blankets)?: Unable Difficulty moving from lying on back to sitting on the side of the bed? : Unable Difficulty sitting down on and standing up from a chair with arms (e.g., wheelchair, bedside commode, etc,.)?: Unable Help needed moving to and from a bed to chair (including a wheelchair)?: A Lot Help needed walking in hospital room?: Total Help needed climbing 3-5 steps with a railing? : Total 6 Click Score: 7    End of Session Equipment Utilized During Treatment: Gait belt Activity Tolerance: Other (comment);Patient tolerated treatment well(limited by anxiety) Patient left: in chair;with call bell/phone within reach;with chair alarm set;with nursing/sitter in room Nurse Communication: Mobility status PT Visit Diagnosis: Unsteadiness on feet (R26.81);Muscle weakness (generalized) (M62.81)     Time: 1610-9604 PT Time Calculation (min) (ACUTE ONLY): 23 min  Charges:  $Gait Training: 23-37 mins                     Amanda Hall, PT, DPT Acute Rehabilitation Services Pager: [phone] Office: [phone]    Amanda Hall 10/25/2017, 1:14 PM

## 2017-10-25 NOTE — Progress Notes (Signed)
PROGRESS NOTE    Amanda Hall  ZOX:[id number] DOB: [birth year] DOA: 10/05/2017 PCP: [name], MD    Brief Narrative:  67 year old woman PMH depression, anxiety, recent inpatient psychiatric admission, presented with increased confusion, agitation, refusal to take medications, not eating, not drinking.  Admitted for acute kidney injury, acute encephalopathy.  Psychiatrist has been adjusting patient 's medications. Last medication added was lithium. Patient now has cortrack and is getting tube feeding. Might need to determine if she will required Peg tube. She was also started on treatment of UTI. Follow urine culture sensitivity.  Patient continue to be depressed, anxious.    Assessment & Plan:   Principal Problem:   MDD (major depressive disorder), recurrent severe, without psychosis (HCC) Active Problems:   MDD (major depressive disorder)   Anorexia   Dysphagia   Severe malnutrition (HCC)   Anxiety  Depression severe, anxiety;  Continue with Zyprexa, luvox, Remeron Dr Nelva Bush will follow on patient once a week.  She has had levels check for TSH, HIV, Ammonia level within normal limits.  Appreciate Dr Nelva Bush help. effexor discontinue. Patient will be started on lithium.  Require inpatient psychiatric admission.   UTI;  UA with 21-50 WBC.  Follow urine culture. 100,000 aerococcus species. Follow sensitivity  Started  IV ceftriaxone. Day 3.   Severe malnutrition; underweight.  cortrak was placed after discussion with sister.  On tube feeding. Started 10-07 Monitor for refeeding syndrome.  Replete phosphorus.  On remeron.  B 12 438 and vitamin d pending.  Check vitamin B 1 level.  On Thiamine IV  phosphorous low, start phosphorous  supplement.  Cortisol low, ACTH; cortisol with normal respond rule out adrenal insufficiency.   Dysphagia. --Continue dysphagia 1 diet, thin liquids, medications crushed with pure  Acute kidney injury, dehydration, hypernatremia  secondary to refusing to eat and drink secondary to underlying psychiatric disorder --Resolved with hydration.   -resume IV fluids.   Acute encephalopathy with agitation, confusion on admission, secondary to anxiety, depression.  No evidence of infection.  Metabolic derangement corrected..  IVC paperwork completed in the emergency department.  Head CT negative for acute findings. --Continue care as above  SIRS.  Afebrile but had mild leukocytosis, tachycardia and elevated lactic acid on admission.  Urinalysis and chest x-ray without evidence of infection.  Thought secondary to dehydration. --Resolved  Constipation; had large BM 10-12 per nurse   DVT prophylaxis: lovenox Code Status: full code.  Family Communication:  No family at bedside.  Disposition Plan: awaiting psychiatric facility   Consultants:   Psych     Procedures:  none  Antimicrobials:   none   Subjective: Alert, sitting in recliner with help of PT.  Anxious.    Objective: Vitals:   10/24/17 1728 10/24/17 2143 10/25/17 0555 10/25/17 0759  BP: 140/78 (!) 146/71 125/68 (!) 156/89  Pulse: (!) 112 99 88 (!) 116  Resp: 19 18 18 18   Temp: 97.9 F (36.6 C) 97.8 F (36.6 C) 98.1 F (36.7 C)   TempSrc: Oral Oral Oral   SpO2: 99% 100% 90% 100%  Weight:      Height:        Intake/Output Summary (Last 24 hours) at 10/25/2017 1418 Last data filed at 10/25/2017 0945 Gross per 24 hour  Intake 1132.7 ml  Output 0 ml  Net 1132.7 ml   Filed Weights   10/18/17 0603 10/23/17 0500 10/24/17 0143  Weight: 41.1 kg 41.1 kg 41.1 kg    Examination:  General exam: cachetic, anxious.  Respiratory system: CTA Cardiovascular system: S 1, S 2 RRR Gastrointestinal system: BS present, soft, nt Central nervous system: alert.  Extremities; moves, extremities.  Skin: No rashes.  Psychiatry: flat affect  Data Reviewed: I have personally reviewed following labs and imaging studies  CBC: Recent Labs  Lab  10/20/17 0552 10/22/17 0629 10/23/17 0648 10/24/17 0834  WBC 6.2 6.2 5.8 5.6  HGB 13.1 12.4 11.2* 11.9*  HCT 40.9 37.8 36.6 37.5  MCV 91.7 91.1 93.1 91.7  PLT 288 304 268 286   Basic Metabolic Panel: Recent Labs  Lab 10/20/17 0552 10/21/17 0511 10/21/17 0757 10/22/17 0629 10/23/17 0648 10/24/17 0834 10/25/17 0417  NA 140  --  138 139 141 142 142  K 4.0  --  4.1 3.9 4.6 4.5 3.5  CL 104  --  104 102 107 109 106  CO2 30  --  27 29 28 29 29   GLUCOSE 93  --  141* 139* 116* 124* 107*  BUN 26*  --  28* 25* 18 16 20   CREATININE 0.83  --  0.81 0.84 0.76 0.67 0.69  CALCIUM 9.6  --  9.1 9.8 9.8 9.9 10.0  MG 2.2 2.0  --  2.0 2.1 2.1  --   PHOS 2.5 2.4*  --  2.5 2.9 2.9 2.4*   GFR: Estimated Creatinine Clearance: 44.9 mL/min (by C-G formula based on SCr of 0.69 mg/dL). Liver Function Tests: No results for input(s): AST, ALT, ALKPHOS, BILITOT, PROT, ALBUMIN in the last 168 hours. No results for input(s): LIPASE, AMYLASE in the last 168 hours. No results for input(s): AMMONIA in the last 168 hours. Coagulation Profile: No results for input(s): INR, PROTIME in the last 168 hours. Cardiac Enzymes: No results for input(s): CKTOTAL, CKMB, CKMBINDEX, TROPONINI in the last 168 hours. BNP (last 3 results) No results for input(s): PROBNP in the last 8760 hours. HbA1C: No results for input(s): HGBA1C in the last 72 hours. CBG: Recent Labs  Lab 10/24/17 1203 10/24/17 1730 10/24/17 2142 10/25/17 0755 10/25/17 1111  GLUCAP 150* 136* 164* 134* 129*   Lipid Profile: No results for input(s): CHOL, HDL, LDLCALC, TRIG, CHOLHDL, LDLDIRECT in the last 72 hours. Thyroid Function Tests: No results for input(s): TSH, T4TOTAL, FREET4, T3FREE, THYROIDAB in the last 72 hours. Anemia Panel: No results for input(s): VITAMINB12, FOLATE, FERRITIN, TIBC, IRON, RETICCTPCT in the last 72 hours. Sepsis Labs: No results for input(s): PROCALCITON, LATICACIDVEN in the last 168 hours.  Recent Results  (from the past 240 hour(s))  Urine Culture     Status: Abnormal   Collection Time: 10/23/17  9:13 AM  Result Value Ref Range Status   Specimen Description URINE, RANDOM  Final   Special Requests   Final    NONE Performed at [facility], [address], [place], [place] [address]    Culture >=100,000 COLONIES/mL AEROCOCCUS URINAE (A)  Final   Report Status 10/25/2017 FINAL  Final         Radiology Studies: No results found.      Scheduled Meds: . bisacodyl  10 mg Rectal Once  . enoxaparin (LOVENOX) injection  30 mg Subcutaneous Q24H  . feeding supplement (ENSURE ENLIVE)  237 mL Per Tube TID BM  . fluvoxaMINE  150 mg Per Tube QHS  . lithium carbonate  300 mg Oral QHS  . magnesium hydroxide  30 mL Per Tube Daily  . magnesium oxide  400 mg Per Tube BID  . megestrol  400 mg Per Tube  Daily  . mirtazapine  45 mg Per Tube QHS  . multivitamin  15 mL Oral Daily  . OLANZapine zydis  10 mg Sublingual QHS  . phosphorus  500 mg Oral BID WC  . polyethylene glycol  17 g Per Tube BID  . sennosides  5 mL Per Tube QHS  . simvastatin  10 mg Per Tube q1800  . sodium chloride flush  3 mL Intravenous Q12H  . thiamine injection  100 mg Intravenous Daily   Continuous Infusions: . sodium chloride 75 mL/hr at 10/25/17 0755  . cefTRIAXone (ROCEPHIN)  IV 1 g (10/25/17 1200)  . feeding supplement (OSMOLITE 1.2 CAL) 1,000 mL (10/25/17 0117)     LOS: 20 days    Time spent: 35 minutes.     Alba Cory, MD [facility] Pager [phone]  If 7PM-7AM, please contact night-coverage [email or web address] Password [email or web address] 10/25/2017, 2:18 PM

## 2017-10-26 LAB — GLUCOSE, CAPILLARY
GLUCOSE-CAPILLARY: 114 mg/dL — AB (ref 70–99)
Glucose-Capillary: 151 mg/dL — ABNORMAL HIGH (ref 70–99)
Glucose-Capillary: 119 mg/dL — ABNORMAL HIGH (ref 70–99)
Glucose-Capillary: 124 mg/dL — ABNORMAL HIGH (ref 70–99)

## 2017-10-26 MED ORDER — VITAMIN C 500 MG PO TABS
250.0000 mg | ORAL_TABLET | Freq: Two times a day (BID) | ORAL | Status: AC
  Administered 2017-10-26 – 2017-10-31 (×10): 250 mg
  Filled 2017-10-26 (×10): qty 1

## 2017-10-26 MED ORDER — ASCORBIC ACID 500 MG/ML IJ SOLN: 250.0000 mg | Freq: Two times a day (BID) | INTRAMUSCULAR | Status: DC

## 2017-10-26 MED ORDER — FREE WATER
30.0000 mL | Status: DC
  Administered 2017-10-26 – 2017-11-09 (×78): 30 mL

## 2017-10-26 MED ORDER — VITAMIN B-1 100 MG PO TABS
100.0000 mg | ORAL_TABLET | Freq: Every day | ORAL | Status: DC
  Administered 2017-10-26 – 2017-12-27 (×63): 100 mg via ORAL
  Filled 2017-10-26 (×63): qty 1

## 2017-10-26 MED ORDER — ASCORBIC ACID 500 MG/ML IJ SOLN
250.0000 mg | Freq: Two times a day (BID) | INTRAMUSCULAR | Status: DC
  Filled 2017-10-26 (×2): qty 0.5

## 2017-10-26 NOTE — Progress Notes (Addendum)
OT Cancellation Note  Patient Details Name: Ladina Shutters MRN: [id number] DOB: 01/18/1950   Cancelled Treatment:    Reason Eval/Treat Not Completed: Patient declined, no reason specified. Pt tearful, shaking her head 'no.' Agreeable to OT reattempting tomorrow.   Raynald Kemp, OT Acute Rehabilitation Services Pager: [phone] Office: [phone]   10/26/2017, 2:01 PM

## 2017-10-26 NOTE — Progress Notes (Addendum)
Nutrition Follow-up  DOCUMENTATION CODES:   Severe malnutrition in context of social or environmental circumstances, Underweight  INTERVENTION:   Tube Feeding:  Continue Osmolite 1.5 @ 55 ml/hr via Cortrak  Free water 78m q 4 hrs to prevent clogging   Magic cup TID with meals, each supplement provides 290 kcal and 9 grams of protein; offer more often as well if pt will take  Discontinue Ensure Enlive   Discontinue MVI daily as pt is meeting her estimated needs via tube feeds  Add vitamin C 250 IV BID X 5 days   NUTRITION DIAGNOSIS:   Severe Malnutrition related to social / environmental circumstances(severe major depressive disorder, refractory depression, anxiety) as evidenced by severe fat depletion, severe muscle depletion.  Continues but being addressed via nutrition support  GOAL:   Patient will meet greater than or equal to 90% of their needs  Met  MONITOR:   PO intake, Supplement acceptance, Labs, Weight trends  ASSESSMENT:   67yo female admitted with AKI, dehydration, anorexia with acute metabolic encephalopathy, FTT. Pt has been refusing to eat, drink or take medications. Pt has severe protein calorie malnutrition. Pt with recent inpatient psych hospitalization 1 month ago. Pt reports difficulty swallowing although pt's sister believes this to be subjective. Pt with MDD, severe, with psych consult. PMH includes anxiety, depression, malnutrition   Visited pt's room today. Pt upset at time of RD visit today as she was told that she may need PEG tube placement. Cortrak remains in place. Pt is tolerating her tube feeds well but is not taking anything by mouth. RN has been putting the Ensure Enlive down the cortrak tube. Pt noted to have severe malnutrition and ecchymosis. Pt at risk for vitamin C deficiency; symptoms usually seen with deficiency can include severe depression, poor po intake and ecchymosis. Spoke to MD, will plan to given IV vitamin C x 5 days to see if  any improvement in pt's mental status and appetite. Will discontinue Ensure and MVI as pt is able to meet 100% of her estimated needs via her tube feeds. Pt on remeron and megace. Consider changing megace to 2046mBID. Per chart, pt appears weight stable since admit; pt's weight up a little today. RD will continue to monitor. Pt with low phosphorus 10/15; would recommend continuing to monitor labs until stable as pt at refeeding risk.    Medications reviewed and include: lovenox, lithium, Mg oxide, megace, mirtazapine, MVI, Kphos, miralax, senokot, thiamine, NaCl @75ml /hr, ceftriaxone  Labs reviewed: K 3.5 wnl, P 2.4(L)- 10/15  Diet Order:   Diet Order            DIET - DYS 1 Room service appropriate? Yes; Fluid consistency: Thin  Diet effective now             EDUCATION NEEDS:   Not appropriate for education at this time  Skin:  Skin Assessment: Reviewed RN Assessment  Last BM:  10/16  Height:   Ht Readings from Last 1 Encounters:  10/05/17 5' 2"  (1.575 m)    Weight:   Wt Readings from Last 1 Encounters:  10/24/17 41.1 kg    Ideal Body Weight:  50 kg  BMI:  Body mass index is 16.57 kg/m.  Estimated Nutritional Needs:   Kcal:  146644-0347cals   Protein:  72-82 g   Fluid:  >/= 1.4 L  CaKoleen DistanceS, RD, LDN Pager #- [phone]#- [phone] Hours Pager: [phone]

## 2017-10-26 NOTE — Progress Notes (Signed)
PROGRESS NOTE  Amanda Hall  ZOX:[id number] DOB: 03-21-1950 DOA: 10/05/2017 PCP: [name], MD   Brief Narrative: Amanda Hall is a 67 y.o. female with a history of depression and anxiety with recent psychiatric hospitalization who presented 9/25 with confusion, agitation, refusal to take medications or per oral intake found to have AKI and acute encephalopathy for which she was admitted. Psychiatry was consulted and guiding medical management. Due to mood-induced anorexia and severe malnutrition, cortrak was placed 10/7 and tube feedings started. Urine culture grew aerococcus for which ceftriaxone was started. The patient continues to have severe psychiatric symptoms.  Assessment & Plan: Principal Problem:   MDD (major depressive disorder), recurrent severe, without psychosis (HCC) Active Problems:   MDD (major depressive disorder)   Anorexia   Dysphagia   Severe malnutrition (HCC)   Anxiety  Severe depression and anxiety:  - Continue zyprexa qHS, fluvoxamine, mirtazepine (dose to assist with appetite) - Appreciate psychiatry's recommendations going forward.  - Most recently started lithium (initial level low). Of course, do not anticipate abrupt improvements with psychotropic medication titration.  - ?needs prn benzodiazepine  Acute metabolic encephalopathy: Unclear etiology, possibly related to UTI with  contribution by mood disorder. TSH, HIV, ammonia wnl. Cortisol found to be low though ACTH stim test was appropriate/wnl. - Under IVC   Aerococcus UTI:  - Has completed 3 days of ceftriaxone, remains afebrile without leukocytosis. Can DC abx now.  Severe protein calorie malnutrition due to anorexia due to depression (not nervosa): Vitamin D level 32, B12 438, thiamine normal at 114. - Continue feeding through cortrak. Consideration for PEG tube placement broached this morning with the patient adamantly refusing.  - Continue close monitoring for refeeding syndrome, daily  magnesium and phosphorus per tube. - In setting of malnutrition and severe depression, will empirically start 5 days vitamin C replacement (labs would take that long to return anyway).  - Check prealbumin.  AKI and hypernatremia due to dehydration: Resolved. SIRS also noted on admission probably due to dehydration, since resolved, but do NOT believe the patient had sepsis.  - Continue tube feeding, free water per RD.  - Monitor BMP daily  Constipation: Resolved.  - Continue regular bowel program.  Dysphagia: Continue offering dysphagia diet.  DVT prophylaxis: Lovenox Code Status: Full Family Communication: None at bedside. Sister and brother have been involved in her care. Disposition Plan: Awaiting placement at psychiatry facility. Per report, not able to do so with feeding tube in place.   Consultants:   Psychiatry  Procedures:   Cortrak placement 10/7  Antimicrobials:  Ceftriaxone 10/13 - 10/16   Subjective: Severely frightened of "everything," and particularly when I asked if anyone had mentioned a more permanent feeding tube. Anxiety is severe.   Objective: Vitals:   10/26/17 0057 10/26/17 0433 10/26/17 0846 10/26/17 1252  BP: (!) 155/74 130/70 135/61   Pulse: (!) 115 (!) 105 (!) 115   Resp:  18 19   Temp: 98.2 F (36.8 C) 97.6 F (36.4 C)    TempSrc: Oral     SpO2: 97% 97% 100%   Weight:    46.8 kg  Height:        Intake/Output Summary (Last 24 hours) at 10/26/2017 1342 Last data filed at 10/26/2017 0925 Gross per 24 hour  Intake 3037.52 ml  Output 601 ml  Net 2436.52 ml   Filed Weights   10/23/17 0500 10/24/17 0143 10/26/17 1252  Weight: 41.1 kg 41.1 kg 46.8 kg    Gen: 67 y.o.  female appearing frightened  HEENT: Right nare cortrak in place without abrasion. Pulm: Non-labored breathing. Clear to auscultation bilaterally.  CV: Regular tachycardia. No murmur, rub, or gallop. No JVD, no pedal edema. GI: Abdomen soft, non-tender, non-distended, with  normoactive bowel sounds. No organomegaly or masses felt. Ext: Warm, no deformities, poor muscle bulk. Skin: Some scattered ecchymoses. No rashes, lesions or ulcers Neuro: Alert and oriented. No focal neurological deficits. Psych: Judgement and insight appear normal. Mood & affect appropriate.   Data Reviewed: I have personally reviewed following labs and imaging studies  CBC: Recent Labs  Lab 10/20/17 0552 10/22/17 0629 10/23/17 0648 10/24/17 0834  WBC 6.2 6.2 5.8 5.6  HGB 13.1 12.4 11.2* 11.9*  HCT 40.9 37.8 36.6 37.5  MCV 91.7 91.1 93.1 91.7  PLT 288 304 268 286   Basic Metabolic Panel: Recent Labs  Lab 10/20/17 0552 10/21/17 0511 10/21/17 0757 10/22/17 0629 10/23/17 0648 10/24/17 0834 10/25/17 0417  NA 140  --  138 139 141 142 142  K 4.0  --  4.1 3.9 4.6 4.5 3.5  CL 104  --  104 102 107 109 106  CO2 30  --  27 29 28 29 29   GLUCOSE 93  --  141* 139* 116* 124* 107*  BUN 26*  --  28* 25* 18 16 20   CREATININE 0.83  --  0.81 0.84 0.76 0.67 0.69  CALCIUM 9.6  --  9.1 9.8 9.8 9.9 10.0  MG 2.2 2.0  --  2.0 2.1 2.1  --   PHOS 2.5 2.4*  --  2.5 2.9 2.9 2.4*   GFR: Estimated Creatinine Clearance: 51.1 mL/min (by C-G formula based on SCr of 0.69 mg/dL). Liver Function Tests: No results for input(s): AST, ALT, ALKPHOS, BILITOT, PROT, ALBUMIN in the last 168 hours. No results for input(s): LIPASE, AMYLASE in the last 168 hours. No results for input(s): AMMONIA in the last 168 hours. Coagulation Profile: No results for input(s): INR, PROTIME in the last 168 hours. Cardiac Enzymes: No results for input(s): CKTOTAL, CKMB, CKMBINDEX, TROPONINI in the last 168 hours. BNP (last 3 results) No results for input(s): PROBNP in the last 8760 hours. HbA1C: No results for input(s): HGBA1C in the last 72 hours. CBG: Recent Labs  Lab 10/25/17 1111 10/25/17 1642 10/25/17 2300 10/26/17 0825 10/26/17 1133  GLUCAP 129* 114* 110* 114* 151*   Lipid Profile: No results for  input(s): CHOL, HDL, LDLCALC, TRIG, CHOLHDL, LDLDIRECT in the last 72 hours. Thyroid Function Tests: No results for input(s): TSH, T4TOTAL, FREET4, T3FREE, THYROIDAB in the last 72 hours. Anemia Panel: No results for input(s): VITAMINB12, FOLATE, FERRITIN, TIBC, IRON, RETICCTPCT in the last 72 hours. Urine analysis:    Component Value Date/Time   COLORURINE AMBER (A) 10/23/2017 0106   APPEARANCEUR CLOUDY (A) 10/23/2017 0106   LABSPEC 1.009 10/23/2017 0106   PHURINE 9.0 (H) 10/23/2017 0106   GLUCOSEU NEGATIVE 10/23/2017 0106   HGBUR MODERATE (A) 10/23/2017 0106   BILIRUBINUR NEGATIVE 10/23/2017 0106   KETONESUR NEGATIVE 10/23/2017 0106   PROTEINUR NEGATIVE 10/23/2017 0106   UROBILINOGEN 0.2 12/15/2006 1654   NITRITE NEGATIVE 10/23/2017 0106   LEUKOCYTESUR MODERATE (A) 10/23/2017 0106   Recent Results (from the past 240 hour(s))  Urine Culture     Status: Abnormal   Collection Time: 10/23/17  9:13 AM  Result Value Ref Range Status   Specimen Description URINE, RANDOM  Final   Special Requests   Final    NONE Performed at [facility]  [facility], [address] [place], [place] [address]    Culture >=100,000 COLONIES/mL AEROCOCCUS URINAE (A)  Final   Report Status 10/25/2017 FINAL  Final      Radiology Studies: No results found.  Scheduled Meds: . ascorbic acid  250 mg Intravenous BID  . enoxaparin (LOVENOX) injection  30 mg Subcutaneous Q24H  . fluvoxaMINE  150 mg Per Tube QHS  . free water  30 mL Per Tube Q4H  . lithium carbonate  300 mg Oral QHS  . magnesium hydroxide  30 mL Per Tube Daily  . magnesium oxide  400 mg Per Tube BID  . megestrol  400 mg Per Tube Daily  . mirtazapine  45 mg Per Tube QHS  . OLANZapine zydis  10 mg Sublingual QHS  . phosphorus  500 mg Oral BID WC  . polyethylene glycol  17 g Per Tube BID  . sennosides  5 mL Per Tube QHS  . simvastatin  10 mg Per Tube q1800  . sodium chloride flush  3 mL Intravenous Q12H  . thiamine injection  100 mg  Intravenous Daily   Continuous Infusions: . sodium chloride 75 mL/hr at 10/26/17 1153  . cefTRIAXone (ROCEPHIN)  IV 1 g (10/26/17 1051)  . feeding supplement (OSMOLITE 1.2 CAL) 1,000 mL (10/25/17 2244)     LOS: 21 days   Time spent: 25 minutes.  Tyrone Nine, MD [facility] [email or web address] Password [email or web address] 10/26/2017, 1:42 PM

## 2017-10-27 LAB — BASIC METABOLIC PANEL
Anion gap: 4 — ABNORMAL LOW (ref 5–15)
BUN: 16 mg/dL (ref 8–23)
CO2: 28 mmol/L (ref 22–32)
CREATININE: 0.59 mg/dL (ref 0.44–1.00)
Calcium: 9.4 mg/dL (ref 8.9–10.3)
Chloride: 107 mmol/L (ref 98–111)
GFR calc non Af Amer: >60 mL/min (ref >60)
GLUCOSE: 146 mg/dL — AB (ref 70–99)
Potassium: 3.8 mmol/L (ref 3.5–5.1)
Sodium: 139 mmol/L (ref 135–145)

## 2017-10-27 LAB — MAGNESIUM: Magnesium: 2 mg/dL (ref 1.7–2.4)

## 2017-10-27 LAB — PHOSPHORUS: Phosphorus: 2.4 mg/dL — ABNORMAL LOW (ref 2.5–4.6)

## 2017-10-27 LAB — GLUCOSE, CAPILLARY
GLUCOSE-CAPILLARY: 124 mg/dL — AB (ref 70–99)
GLUCOSE-CAPILLARY: 125 mg/dL — AB (ref 70–99)
Glucose-Capillary: 111 mg/dL — ABNORMAL HIGH (ref 70–99)

## 2017-10-27 LAB — PREALBUMIN: Prealbumin: 21.8 mg/dL (ref 18–38)

## 2017-10-27 MED ORDER — LORAZEPAM 2 MG/ML IJ SOLN
0.5000 mg | INTRAMUSCULAR | Status: DC | PRN
  Administered 2017-10-27 – 2017-10-29 (×3): 0.5 mg via INTRAVENOUS
  Filled 2017-10-27 (×3): qty 1

## 2017-10-27 MED ORDER — K PHOS MONO-SOD PHOS DI & MONO 155-852-130 MG PO TABS
500.0000 mg | ORAL_TABLET | Freq: Three times a day (TID) | ORAL | Status: DC
  Administered 2017-10-27 – 2017-12-27 (×163): 500 mg via ORAL
  Filled 2017-10-27 (×190): qty 2

## 2017-10-27 MED ORDER — LITHIUM CITRATE 300 MG/5 ML PO SYRP
300.0000 mg | Freq: Once | ORAL | Status: AC
  Administered 2017-10-27: 300 mg
  Filled 2017-10-27: qty 5

## 2017-10-27 MED ORDER — LITHIUM CITRATE 300 MG/5 ML PO SYRP
300.0000 mg | Freq: Two times a day (BID) | ORAL | Status: DC
  Administered 2017-10-27 – 2017-11-17 (×42): 300 mg
  Filled 2017-10-27 (×43): qty 5

## 2017-10-27 MED ORDER — LITHIUM CARBONATE ER 300 MG PO TBCR
300.0000 mg | EXTENDED_RELEASE_TABLET | Freq: Two times a day (BID) | ORAL | Status: DC
  Filled 2017-10-27: qty 1

## 2017-10-27 NOTE — Progress Notes (Signed)
Physical Therapy Treatment Patient Details Name: Amanda Hall MRN: [id number] DOB: April 09, 1950 Today's Date: 10/27/2017    History of Present Illness Pt is a 67 y/o female admitted secondary to major depressive disorder. Pt with increased confusion and agitation prior to admission. Found to have AKI and acute encephalopathy. CT negative for acute abnormality. PMH includes anxiety and depression.     PT Comments    Pt limited during session by anxiety.  Requires constant cues for next steps and reassurance for performance.  Still requiring mod/max +1 assist for basic mobility and not safe to ambulate with +1 assistance at this time.  Continue with plan of care.     Follow Up Recommendations  Supervision/Assistance - 24 hour;Other (comment)     Equipment Recommendations  None recommended by PT    Recommendations for Other Services       Precautions / Restrictions Precautions Precautions: Fall Precaution Comments: Recruitment consultant, cortrack tube Restrictions Weight Bearing Restrictions: No    Mobility  Bed Mobility Overal bed mobility: Needs Assistance Bed Mobility: Rolling;Sidelying to Sit Rolling: Min assist Sidelying to sit: HOB elevated;Mod assist          Transfers Overall transfer level: Needs assistance   Transfers: Sit to/from Stand;Stand Pivot Transfers Sit to Stand: Mod assist Stand pivot transfers: Max assist       General transfer comment: PT blocking bil knees, providing facilitation for hip extension  Ambulation/Gait                 Stairs             Wheelchair Mobility    Modified Rankin (Stroke Patients Only)       Balance Overall balance assessment: Needs assistance Sitting-balance support: Bilateral upper extremity supported Sitting balance-Leahy Scale: Fair     Standing balance support: During functional activity Standing balance-Leahy Scale: Zero                              Cognition Arousal/Alertness:  Awake/alert Behavior During Therapy: Anxious Overall Cognitive Status: No family/caregiver present to determine baseline cognitive functioning                                 General Comments: very anxious throughout session, requires constant reassurance      Exercises      General Comments        Pertinent Vitals/Pain Pain Assessment: 0-10 Pain Score: 2  Pain Intervention(s): Monitored during session    Home Living                      Prior Function            PT Goals (current goals can now be found in the care plan section) Acute Rehab PT Goals Patient Stated Goal: did not state PT Goal Formulation: Patient unable to participate in goal setting Time For Goal Achievement: 11/01/17 Potential to Achieve Goals: Fair Progress towards PT goals: Progressing toward goals    Frequency    Min 2X/week      PT Plan Current plan remains appropriate    Co-evaluation              AM-PAC PT "6 Clicks" Daily Activity  Outcome Measure  Difficulty turning over in bed (including adjusting bedclothes, sheets and blankets)?: A Little Difficulty moving from lying on back to sitting  on the side of the bed? : A Little Difficulty sitting down on and standing up from a chair with arms (e.g., wheelchair, bedside commode, etc,.)?: A Lot Help needed moving to and from a bed to chair (including a wheelchair)?: A Lot Help needed walking in hospital room?: Total Help needed climbing 3-5 steps with a railing? : Total 6 Click Score: 12    End of Session   Activity Tolerance: (limited by anxiety) Patient left: in chair;with nursing/sitter in room Nurse Communication: Mobility status PT Visit Diagnosis: Unsteadiness on feet (R26.81);Muscle weakness (generalized) (M62.81)     Time: 0145-0210 PT Time Calculation (min) (ACUTE ONLY): 25 min  Charges:  $Therapeutic Activity: 23-37 mins                        Stephania Fragmin 10/27/2017, 2:15  PM

## 2017-10-27 NOTE — Progress Notes (Signed)
Occupational Therapy Treatment Patient Details Name: Amanda Hall MRN: [id number] DOB: 12/17/1950 Today's Date: 10/27/2017    History of present illness Pt is a 67 y/o female admitted secondary to major depressive disorder. Pt with increased confusion and agitation prior to admission. Found to have AKI and acute encephalopathy. CT negative for acute abnormality. PMH includes anxiety and depression.    OT comments  This 67 yo female admitted with above presents to acute OT making progress with starting on grooming and able to participate in 15 minutes + of activity today. She will continue to benefit from acute OT with follow up OT at inpatient psych facility.  Follow Up Recommendations  Other (comment)(psych hospital with OT/PT)    Equipment Recommendations  Other (comment)(TBD at next venue)       Precautions / Restrictions Precautions Precautions: Fall Precaution Comments: Recruitment consultant, cortrack tube Restrictions Weight Bearing Restrictions: No              ADL either performed or assessed with clinical judgement   ADL Overall ADL's : Needs assistance/impaired     Grooming: Wash/dry face Grooming Details (indicate cue type and reason): Supported sitting in recliner: Pt handed washcloth in right hand and she brought it towards her face but unable to get to face, tried in left hand with same result (pt wtih resistance with attempt at hand over hand to compelte task), did some arm exercises for shoulders and then had her touch her chin with right and left hand which she was able to do. Handed wash cloth to her again and she was able to wash her chin, lips, and lower left mandible area with LUE.                                     Vision Baseline Vision/History: Wears glasses            Cognition Arousal/Alertness: Awake/alert Behavior During Therapy: Anxious Overall Cognitive Status: No family/caregiver present to determine baseline cognitive  functioning                                 General Comments: More anxious at end of session when she started c/o her bottom hurting her (repositoned in recliner)        Exercises Other Exercises Other Exercises: Bil shoulder exercises AAROM with pt's hand in mine; neck AROM from left working towards midline           Pertinent Vitals/ Pain       Pain Assessment: Faces Pain Score: 2  Faces Pain Scale: Hurts a little bit Pain Location: bottom at times and neck with her turning head to right Pain Descriptors / Indicators: Grimacing;Guarding Pain Intervention(s): Limited activity within patient's tolerance;Monitored during session;Repositioned(placed towel roll under left side of pillow to help facilitate more midline of head)         Frequency  Min 2X/week        Progress Toward Goals  OT Goals(current goals can now be found in the care plan section)  Progress towards OT goals: Progressing toward goals  Acute Rehab OT Goals Patient Stated Goal: did not state  Plan Discharge plan remains appropriate       AM-PAC PT "6 Clicks" Daily Activity     Outcome Measure   Help from another person eating meals?: Total Help from another person  taking care of personal grooming?: Total Help from another person toileting, which includes using toliet, bedpan, or urinal?: Total Help from another person bathing (including washing, rinsing, drying)?: Total Help from another person to put on and taking off regular upper body clothing?: Total Help from another person to put on and taking off regular lower body clothing?: Total 6 Click Score: 6    End of Session    OT Visit Diagnosis: Other abnormalities of gait and mobility (R26.89);Muscle weakness (generalized) (M62.81);Pain Pain - part of body: (bottom and neck)   Activity Tolerance Patient tolerated treatment well(good at beginning with increased anxiousness at end)   Patient Left in chair;with call bell/phone  within reach;with nursing/sitter in room(sitter)   Nurse Communication          Time: 1610-9604 OT Time Calculation (min): 27 min  Charges: OT General Charges $OT Visit: 1 Visit OT Treatments $Self Care/Home Management : 23-37 mins  Ignacia Palma, OTR/L Acute [employer] Pager [phone] Office [phone]

## 2017-10-27 NOTE — Progress Notes (Signed)
PROGRESS NOTE  Amanda Hall  WUJ:[id number] DOB: 02-21-1950 DOA: 10/05/2017 PCP: [name], MD   Brief Narrative: Amanda Hall is a 67 y.o. female with a history of depression and anxiety with recent psychiatric hospitalization who presented 9/25 with confusion, agitation, refusal to take medications or per oral intake found to have AKI and acute encephalopathy for which she was admitted. Psychiatry was consulted and guiding medical management. Due to mood-induced anorexia and severe malnutrition, cortrak was placed 10/7 and tube feedings started. Urine culture grew aerococcus for which ceftriaxone was started. The patient continues to have severe psychiatric symptoms, predominantly anxiety.  Assessment & Plan: Principal Problem:   MDD (major depressive disorder), recurrent severe, without psychosis (HCC) Active Problems:   MDD (major depressive disorder)   Anorexia   Dysphagia   Severe malnutrition (HCC)   Anxiety  Severe depression and anxiety:  - Continue zyprexa qHS, fluvoxamine, mirtazepine (dose to assist with appetite) - Will add low dose ativan and titrate as tolerated/needed. This was discussed with psychiatry 10/17.  - Appreciate psychiatry's recommendations going forward. Needs psychiatric hospitalization, preferably at facility offering ECT. - Most recently started lithium (initial level low), will increase dose and recheck level in a few days.   Acute metabolic encephalopathy: Unclear etiology, possibly related to UTI with  contribution by mood disorder. TSH, HIV, ammonia wnl. Cortisol found to be low though ACTH stim test was appropriate/wnl. - Under IVC, continue sitter  Aerococcus UTI:  - Has completed 4 days of ceftriaxone, remains afebrile without leukocytosis.  Severe protein calorie malnutrition due to anorexia due to depression (not nervosa): Vitamin D level 32, B12 438, thiamine normal at 114. Prealbumin is remarkably normal, reassuring that recent tube  feeding has bolstered nutritional status.  - Continue feeding through cortrak. Consideration for PEG tube placement brought on severe anxiety and is not currently planned.  - Continue close monitoring for refeeding syndrome, daily magnesium and phosphorus per tube. Increase phos replacement as it was slightly low the past 2 days.  - In setting of malnutrition and severe depression, will empirically start 5 days vitamin C replacement (labs would take that long to return anyway).   AKI and hypernatremia due to dehydration: Resolved. SIRS also noted on admission probably due to dehydration, since resolved, but do NOT believe the patient had sepsis.  - Continue tube feeding, free water. - Monitor BMP daily  Constipation: Resolved.  - Continue regular bowel program.  Dysphagia: Continue offering dysphagia diet.  DVT prophylaxis: Lovenox Code Status: Full Family Communication: None at bedside. Disposition Plan: Awaiting placement at psychiatric facility.   Consultants:   Psychiatry  Procedures:   Cortrak placement 10/7  Antimicrobials:  Ceftriaxone 10/13 - 10/16   Subjective: Severe, uncontrolled anxiety continues unchanged. Denies AVH, SI/HI.   Objective: Vitals:   10/26/17 1748 10/26/17 2119 10/27/17 0656 10/27/17 0943  BP: 125/79 129/80 (!) 154/75 (!) 153/82  Pulse: 98 90 91 (!) 109  Resp: 18 (!) 197 18 17  Temp:  97.8 F (36.6 C) 98.4 F (36.9 C)   TempSrc:  Axillary Oral   SpO2: 100% 100% 99% 98%  Weight:  46.8 kg    Height:        Intake/Output Summary (Last 24 hours) at 10/27/2017 1525 Last data filed at 10/27/2017 1350 Gross per 24 hour  Intake 2666.5 ml  Output 2500 ml  Net 166.5 ml   Filed Weights   10/24/17 0143 10/26/17 1252 10/26/17 2119  Weight: 41.1 kg 46.8 kg 46.8 kg  Gen: 67 y.o. female sleeping on entering room, when awoken begins trembling with distressed facial expression and tearing. Pulm: Nonlabored breathing room air. Clear. CV: Regular  rate and rhythm. No murmur, rub, or gallop. No JVD, no dependent edema. GI: Abdomen soft, non-tender, non-distended, with normoactive bowel sounds.  Ext: Warm, no deformities. Decreased global muscle bulk.  Skin: No rashes, lesions or ulcers on visualized skin.  Neuro: Alert and oriented. No focal neurological deficits. Psych: Severely anxious mood, congruent affect, no psychomotor agitation. Slow to answer questions, poor eye contact.  Data Reviewed: I have personally reviewed following labs and imaging studies  CBC: Recent Labs  Lab 10/22/17 0629 10/23/17 0648 10/24/17 0834  WBC 6.2 5.8 5.6  HGB 12.4 11.2* 11.9*  HCT 37.8 36.6 37.5  MCV 91.1 93.1 91.7  PLT 304 268 286   Basic Metabolic Panel: Recent Labs  Lab 10/21/17 0511  10/22/17 0629 10/23/17 0648 10/24/17 0834 10/25/17 0417 10/27/17 0514  NA  --    < > 139 141 142 142 139  K  --    < > 3.9 4.6 4.5 3.5 3.8  CL  --    < > 102 107 109 106 107  CO2  --    < > 29 28 29 29 28   GLUCOSE  --    < > 139* 116* 124* 107* 146*  BUN  --    < > 25* 18 16 20 16   CREATININE  --    < > 0.84 0.76 0.67 0.69 0.59  CALCIUM  --    < > 9.8 9.8 9.9 10.0 9.4  MG 2.0  --  2.0 2.1 2.1  --  2.0  PHOS 2.4*  --  2.5 2.9 2.9 2.4* 2.4*   < > = values in this interval not displayed.   GFR: Estimated Creatinine Clearance: 51.1 mL/min (by C-G formula based on SCr of 0.59 mg/dL). Liver Function Tests: No results for input(s): AST, ALT, ALKPHOS, BILITOT, PROT, ALBUMIN in the last 168 hours. No results for input(s): LIPASE, AMYLASE in the last 168 hours. No results for input(s): AMMONIA in the last 168 hours. Coagulation Profile: No results for input(s): INR, PROTIME in the last 168 hours. Cardiac Enzymes: No results for input(s): CKTOTAL, CKMB, CKMBINDEX, TROPONINI in the last 168 hours. BNP (last 3 results) No results for input(s): PROBNP in the last 8760 hours. HbA1C: No results for input(s): HGBA1C in the last 72 hours. CBG: Recent Labs    Lab 10/26/17 1133 10/26/17 1636 10/26/17 2122 10/27/17 0945 10/27/17 1242  GLUCAP 151* 119* 124* 111* 124*   Lipid Profile: No results for input(s): CHOL, HDL, LDLCALC, TRIG, CHOLHDL, LDLDIRECT in the last 72 hours. Thyroid Function Tests: No results for input(s): TSH, T4TOTAL, FREET4, T3FREE, THYROIDAB in the last 72 hours. Anemia Panel: No results for input(s): VITAMINB12, FOLATE, FERRITIN, TIBC, IRON, RETICCTPCT in the last 72 hours. Urine analysis:    Component Value Date/Time   COLORURINE AMBER (A) 10/23/2017 0106   APPEARANCEUR CLOUDY (A) 10/23/2017 0106   LABSPEC 1.009 10/23/2017 0106   PHURINE 9.0 (H) 10/23/2017 0106   GLUCOSEU NEGATIVE 10/23/2017 0106   HGBUR MODERATE (A) 10/23/2017 0106   BILIRUBINUR NEGATIVE 10/23/2017 0106   KETONESUR NEGATIVE 10/23/2017 0106   PROTEINUR NEGATIVE 10/23/2017 0106   UROBILINOGEN 0.2 12/15/2006 1654   NITRITE NEGATIVE 10/23/2017 0106   LEUKOCYTESUR MODERATE (A) 10/23/2017 0106   Recent Results (from the past 240 hour(s))  Urine Culture     Status: Abnormal  Collection Time: 10/23/17  9:13 AM  Result Value Ref Range Status   Specimen Description URINE, RANDOM  Final   Special Requests   Final    NONE Performed at [facility], [address] [place], [place] [address]    Culture >=100,000 COLONIES/mL AEROCOCCUS URINAE (A)  Final   Report Status 10/25/2017 FINAL  Final      Radiology Studies: No results found.  Scheduled Meds: . enoxaparin (LOVENOX) injection  30 mg Subcutaneous Q24H  . fluvoxaMINE  150 mg Per Tube QHS  . free water  30 mL Per Tube Q4H  . lithium carbonate  300 mg Oral QHS  . magnesium hydroxide  30 mL Per Tube Daily  . magnesium oxide  400 mg Per Tube BID  . megestrol  400 mg Per Tube Daily  . mirtazapine  45 mg Per Tube QHS  . OLANZapine zydis  10 mg Sublingual QHS  . phosphorus  500 mg Oral TID WC  . polyethylene glycol  17 g Per Tube BID  . sennosides  5 mL Per Tube QHS  . simvastatin   10 mg Per Tube q1800  . sodium chloride flush  3 mL Intravenous Q12H  . thiamine  100 mg Oral Daily  . vitamin C  250 mg Per Tube BID   Continuous Infusions: . sodium chloride 75 mL/hr at 10/27/17 1337  . feeding supplement (OSMOLITE 1.2 CAL) 1,000 mL (10/27/17 0007)     LOS: 22 days   Time spent: 25 minutes.  Tyrone Nine, MD [facility] [email or web address] Password [email or web address] 10/27/2017, 3:25 PM

## 2017-10-28 LAB — GLUCOSE, CAPILLARY
GLUCOSE-CAPILLARY: 127 mg/dL — AB (ref 70–99)
Glucose-Capillary: 109 mg/dL — ABNORMAL HIGH (ref 70–99)
Glucose-Capillary: 120 mg/dL — ABNORMAL HIGH (ref 70–99)
Glucose-Capillary: 123 mg/dL — ABNORMAL HIGH (ref 70–99)
Glucose-Capillary: 149 mg/dL — ABNORMAL HIGH (ref 70–99)

## 2017-10-28 LAB — MAGNESIUM: MAGNESIUM: 2.1 mg/dL (ref 1.7–2.4)

## 2017-10-28 LAB — PHOSPHORUS: Phosphorus: 2.6 mg/dL (ref 2.5–4.6)

## 2017-10-28 LAB — BASIC METABOLIC PANEL
Anion gap: 6 (ref 5–15)
BUN: 17 mg/dL (ref 8–23)
CHLORIDE: 105 mmol/L (ref 98–111)
CO2: 29 mmol/L (ref 22–32)
Calcium: 9.7 mg/dL (ref 8.9–10.3)
Creatinine, Ser: 0.62 mg/dL (ref 0.44–1.00)
GFR calc Af Amer: >60 mL/min (ref >60)
Glucose, Bld: 109 mg/dL — ABNORMAL HIGH (ref 70–99)
Potassium: 3.5 mmol/L (ref 3.5–5.1)
SODIUM: 140 mmol/L (ref 135–145)

## 2017-10-28 NOTE — Progress Notes (Signed)
Beginning of shift patient was agitated. However, the calmed down throughout the night. She sleep most of shift with sitter at the bedside. Her night remained uneventful.

## 2017-10-28 NOTE — Clinical Social Work Note (Signed)
Patient continues to be in need of psych placement. CSW contacted [facility] [phone]) and confirmed that patient remains on wait list. CSW will continue to f/u with [facility] regarding patient's status  Genelle Bal, MSW, LCSW Licensed Clinical Social Worker Clinical Social Work Department [employer] [phone]

## 2017-10-28 NOTE — Progress Notes (Signed)
PROGRESS NOTE  Amanda Hall  NWG:[id number] DOB: [birth year] DOA: 10/05/2017 PCP: [name], MD   Brief Narrative: Amanda Hall is a 67 y.o. female with a history of depression and anxiety with recent psychiatric hospitalization who presented 9/25 with confusion, agitation, refusal to take medications or per oral intake found to have AKI and acute encephalopathy for which she was admitted. Psychiatry was consulted and guiding medical management. Due to mood-induced anorexia and severe malnutrition, cortrak was placed 10/7 and tube feedings started. Urine culture grew aerococcus for which ceftriaxone was started. The patient continues to have severe psychiatric symptoms, predominantly anxiety.  Assessment & Plan: Principal Problem:   MDD (major depressive disorder), recurrent severe, without psychosis (HCC) Active Problems:   MDD (major depressive disorder)   Anorexia   Dysphagia   Severe malnutrition (HCC)   Anxiety  Severe depression and anxiety:  - Continue zyprexa qHS, fluvoxamine, mirtazepine (dose to assist with appetite) - Continue low dose ativan, may be helping and no significant oversedation noted. This was discussed with psychiatry 10/17.  - Appreciate psychiatry's recommendations going forward. Needs psychiatric hospitalization, preferably at facility offering ECT. - Increased lithium 300mg  daily > BID on 10/17, level previously low, will recheck when at presumed steady state.   Acute metabolic encephalopathy: Unclear etiology, possibly related to UTI with  contribution by mood disorder. TSH, HIV, ammonia wnl. Cortisol found to be low though ACTH stim test was appropriate/wnl. - Under IVC, continue sitter  Aerococcus UTI:  - Completed 4 days of ceftriaxone, remains afebrile without leukocytosis.  Severe protein calorie malnutrition due to anorexia due to depression (not nervosa): Vitamin D level 32, B12 438, thiamine normal at 114. Prealbumin is remarkably normal,  reassuring that recent tube feeding has bolstered nutritional status.  - Continue feeding through cortrak. Consideration for PEG tube placement brought on severe anxiety and is not currently planned.  - Continuing magnesium, phosphorus supplements.   - Empirically giving vitamin C x5 days.   AKI and hypernatremia due to dehydration: Resolved. SIRS also noted on admission probably due to dehydration, since resolved, but do NOT believe the patient had sepsis.  - Continue tube feeding, free water. - Monitor BMP, Mg, Phos. Since levels have improved, all normal, will give lab holiday.  Constipation: Resolved.  - Continue regular bowel program.  Dysphagia: Continue offering dysphagia diet.  DVT prophylaxis: Lovenox Code Status: Full Family Communication: None at bedside. Disposition Plan: Awaiting placement at psychiatric facility. Remains on [facility] wait list.   Consultants:   Psychiatry  Procedures:   Cortrak placement 10/7  Antimicrobials:  Ceftriaxone 10/13 - 10/16   Subjective: Anxious and agitated yesterday evening improved with low dose benzodiazepine and had an uneventful night sleeping. Has not touched breakfast.  Objective: Vitals:   10/27/17 2127 10/28/17 0500 10/28/17 0551 10/28/17 0847  BP: (!) 109/53  (!) 141/63 (!) 144/80  Pulse: 91  87 90  Resp: 20  20 17   Temp:   97.7 F (36.5 C) 97.8 F (36.6 C)  TempSrc:   Oral Oral  SpO2: 98%  100% 99%  Weight:  46.6 kg    Height:        Intake/Output Summary (Last 24 hours) at 10/28/2017 1441 Last data filed at 10/28/2017 1103 Gross per 24 hour  Intake 665 ml  Output 1200 ml  Net -535 ml   Filed Weights   10/26/17 1252 10/26/17 2119 10/28/17 0500  Weight: 46.8 kg 46.8 kg 46.6 kg   Gen: Thin 67yo female in  no distress HEENT: NGT in right nare with bridle in place Pulm: Nonlabored breathing room air. Clear. CV: Regular rate and rhythm. No murmur, rub, or gallop. No JVD, no dependent edema. GI: Abdomen soft,  non-tender, non-distended, with normoactive bowel sounds.  Ext: Warm, no deformities Skin: No rashes, lesions or ulcers on visualized skin.  Neuro: Sleeping but rousable, when roused begins bodywide tremor and grimace. No focal neurological deficits. Psych: Severe anxiety continues, congruent affect, poor eye contact.   Data Reviewed: I have personally reviewed following labs and imaging studies  CBC: Recent Labs  Lab 10/22/17 0629 10/23/17 0648 10/24/17 0834  WBC 6.2 5.8 5.6  HGB 12.4 11.2* 11.9*  HCT 37.8 36.6 37.5  MCV 91.1 93.1 91.7  PLT 304 268 286   Basic Metabolic Panel: Recent Labs  Lab 10/22/17 0629 10/23/17 0648 10/24/17 0834 10/25/17 0417 10/27/17 0514 10/28/17 0551  NA 139 141 142 142 139 140  K 3.9 4.6 4.5 3.5 3.8 3.5  CL 102 107 109 106 107 105  CO2 29 28 29 29 28 29   GLUCOSE 139* 116* 124* 107* 146* 109*  BUN 25* 18 16 20 16 17   CREATININE 0.84 0.76 0.67 0.69 0.59 0.62  CALCIUM 9.8 9.8 9.9 10.0 9.4 9.7  MG 2.0 2.1 2.1  --  2.0 2.1  PHOS 2.5 2.9 2.9 2.4* 2.4* 2.6   GFR: Estimated Creatinine Clearance: 50.9 mL/min (by C-G formula based on SCr of 0.62 mg/dL).     Component Value Date/Time   COLORURINE AMBER (A) 10/23/2017 0106   APPEARANCEUR CLOUDY (A) 10/23/2017 0106   LABSPEC 1.009 10/23/2017 0106   PHURINE 9.0 (H) 10/23/2017 0106   GLUCOSEU NEGATIVE 10/23/2017 0106   HGBUR MODERATE (A) 10/23/2017 0106   BILIRUBINUR NEGATIVE 10/23/2017 0106   KETONESUR NEGATIVE 10/23/2017 0106   PROTEINUR NEGATIVE 10/23/2017 0106   UROBILINOGEN 0.2 12/15/2006 1654   NITRITE NEGATIVE 10/23/2017 0106   LEUKOCYTESUR MODERATE (A) 10/23/2017 0106   Recent Results (from the past 240 hour(s))  Urine Culture     Status: Abnormal   Collection Time: 10/23/17  9:13 AM  Result Value Ref Range Status   Specimen Description URINE, RANDOM  Final   Special Requests   Final    NONE Performed at [facility], [address], [place], [place] [address]    Culture  >=100,000 COLONIES/mL AEROCOCCUS URINAE (A)  Final   Report Status 10/25/2017 FINAL  Final      Radiology Studies: No results found.  Scheduled Meds: . enoxaparin (LOVENOX) injection  30 mg Subcutaneous Q24H  . fluvoxaMINE  150 mg Per Tube QHS  . free water  30 mL Per Tube Q4H  . lithium citrate  300 mg Per Tube BID  . magnesium hydroxide  30 mL Per Tube Daily  . magnesium oxide  400 mg Per Tube BID  . megestrol  400 mg Per Tube Daily  . mirtazapine  45 mg Per Tube QHS  . OLANZapine zydis  10 mg Sublingual QHS  . phosphorus  500 mg Oral TID WC  . polyethylene glycol  17 g Per Tube BID  . sennosides  5 mL Per Tube QHS  . simvastatin  10 mg Per Tube q1800  . sodium chloride flush  3 mL Intravenous Q12H  . thiamine  100 mg Oral Daily  . vitamin C  250 mg Per Tube BID   Continuous Infusions: . sodium chloride 75 mL/hr at 10/28/17 1425  . feeding supplement (OSMOLITE 1.2  CAL) 1,000 mL (10/28/17 1426)     LOS: 23 days   Time spent: 25 minutes.  Tyrone Nine, MD [facility] [email or web address] Password [email or web address] 10/28/2017, 2:41 PM

## 2017-10-29 LAB — GLUCOSE, CAPILLARY
GLUCOSE-CAPILLARY: 124 mg/dL — AB (ref 70–99)
GLUCOSE-CAPILLARY: 114 mg/dL — AB (ref 70–99)
GLUCOSE-CAPILLARY: 134 mg/dL — AB (ref 70–99)
GLUCOSE-CAPILLARY: 116 mg/dL — AB (ref 70–99)

## 2017-10-29 MED ORDER — CLONAZEPAM 0.5 MG PO TABS
0.5000 mg | ORAL_TABLET | Freq: Three times a day (TID) | ORAL | Status: DC | PRN
  Administered 2017-10-31 – 2017-12-30 (×85): 0.5 mg
  Filled 2017-10-29 (×96): qty 1

## 2017-10-29 MED ORDER — CLONAZEPAM 0.1 MG/ML ORAL SUSPENSION
0.5000 mg | Freq: Three times a day (TID) | ORAL | Status: DC | PRN
  Filled 2017-10-29: qty 5

## 2017-10-29 NOTE — Progress Notes (Signed)
PROGRESS NOTE  Amanda Hall  UJW:[id number] DOB: 03/13/1950 DOA: 10/05/2017 PCP: [name], MD   Brief Narrative: Amanda Hall is a 67 y.o. female with a history of depression and anxiety with recent psychiatric hospitalization who presented 9/25 with confusion, agitation, refusal to take medications or per oral intake found to have AKI and acute encephalopathy for which she was admitted. Psychiatry was consulted and guiding medical management. Due to mood-induced anorexia and severe malnutrition, cortrak was placed 10/7 and tube feedings started. Urine culture grew aerococcus for which ceftriaxone was started. The patient continues to have severe psychiatric symptoms, predominantly anxiety.  Assessment & Plan: Principal Problem:   MDD (major depressive disorder), recurrent severe, without psychosis (HCC) Active Problems:   MDD (major depressive disorder)   Anorexia   Dysphagia   Severe malnutrition (HCC)   Anxiety  Severe depression and anxiety:  - Continue zyprexa qHS, fluvoxamine, mirtazepine (dose to assist with appetite) - Continue low dose benzodiazepine. Seems to have modest benefit without sedation. Will change to longer acting agent now that she's shown tolerance.  - Appreciate psychiatry's recommendations going forward. Needs psychiatric hospitalization, preferably at facility offering ECT. - Increased lithium 300mg  daily > BID on 10/17, level previously low, will recheck when at presumed steady state, 10/21.   Acute metabolic encephalopathy: Unclear etiology, possibly related to UTI with  contribution by mood disorder. TSH, HIV, ammonia wnl. Cortisol found to be low though ACTH stim test was appropriate/wnl. - Under IVC, continue sitter  Aerococcus UTI:  - Completed 4 days of ceftriaxone, remains afebrile without leukocytosis.  Severe protein calorie malnutrition due to anorexia due to depression (not nervosa): Vitamin D level 32, B12 438, thiamine normal at 114.  Prealbumin is remarkably normal, reassuring that recent tube feeding has bolstered nutritional status.  - Continue feeding through cortrak. Consideration for PEG tube placement brought on severe anxiety and is not currently planned.  - Continuing magnesium, phosphorus supplements.   - Empirically giving vitamin C x5 days.   AKI and hypernatremia due to dehydration: Resolved. SIRS also noted on admission probably due to dehydration, since resolved, but do NOT believe the patient had sepsis.  - Continue tube feeding, free water. - Monitor BMP, Mg, Phos. Since levels have improved, all normal, will give lab holiday.  Constipation: Resolved.  - Continue regular bowel program.  Dysphagia: Continue offering dysphagia diet.  DVT prophylaxis: Lovenox Code Status: Full Family Communication: None at bedside. Disposition Plan: Awaiting placement at psychiatric facility. Remains on [facility] wait list.   Consultants:   Psychiatry  Procedures:   Cortrak placement 10/7  Antimicrobials:  Ceftriaxone 10/13 - 10/16   Subjective: Slept better last night, has taken a small cup of vanilla ice cream, wanted sherbet. Otherwise does not think she can taken anything. No vomiting. Remains in a panicked state with intermittent spells of anxiety worsening that are improved by ativan.  Objective: Vitals:   10/29/17 0616 10/29/17 0932 10/29/17 1712 10/29/17 1714  BP: (!) 143/77 116/72 118/70 118/70  Pulse: (!) 107 84 99 99  Resp: 16 16    Temp: 98.7 F (37.1 C) 98.4 F (36.9 C) 98.4 F (36.9 C) 98.4 F (36.9 C)  TempSrc: Oral Oral Oral Oral  SpO2: 99%     Weight:      Height:        Intake/Output Summary (Last 24 hours) at 10/29/2017 1845 Last data filed at 10/29/2017 1500 Gross per 24 hour  Intake 3662.9 ml  Output 750 ml  Net 2912.9 ml   Filed Weights   10/26/17 1252 10/26/17 2119 10/28/17 0500  Weight: 46.8 kg 46.8 kg 46.6 kg   Gen: thin chronically ill-appearing female in no  distress Pulm: Nonlabored breathing room air. Clear. CV: Regular rate and rhythm. No murmur, rub, or gallop. No JVD, no dependent edema. GI: Abdomen soft, non-tender, non-distended, with normoactive bowel sounds.  Ext: Warm, no deformities Skin: No rashes, lesions or ulcers on visualized skin.  Neuro: Alert and oriented. No focal neurological deficits. Psych: Very anxious, tearful, jittery with poor eye contact.   Data Reviewed: I have personally reviewed following labs and imaging studies  CBC: Recent Labs  Lab 10/23/17 0648 10/24/17 0834  WBC 5.8 5.6  HGB 11.2* 11.9*  HCT 36.6 37.5  MCV 93.1 91.7  PLT 268 286   Basic Metabolic Panel: Recent Labs  Lab 10/23/17 0648 10/24/17 0834 10/25/17 0417 10/27/17 0514 10/28/17 0551  NA 141 142 142 139 140  K 4.6 4.5 3.5 3.8 3.5  CL 107 109 106 107 105  CO2 28 29 29 28 29   GLUCOSE 116* 124* 107* 146* 109*  BUN 18 16 20 16 17   CREATININE 0.76 0.67 0.69 0.59 0.62  CALCIUM 9.8 9.9 10.0 9.4 9.7  MG 2.1 2.1  --  2.0 2.1  PHOS 2.9 2.9 2.4* 2.4* 2.6   GFR: Estimated Creatinine Clearance: 50.9 mL/min (by C-G formula based on SCr of 0.62 mg/dL).     Component Value Date/Time   COLORURINE AMBER (A) 10/23/2017 0106   APPEARANCEUR CLOUDY (A) 10/23/2017 0106   LABSPEC 1.009 10/23/2017 0106   PHURINE 9.0 (H) 10/23/2017 0106   GLUCOSEU NEGATIVE 10/23/2017 0106   HGBUR MODERATE (A) 10/23/2017 0106   BILIRUBINUR NEGATIVE 10/23/2017 0106   KETONESUR NEGATIVE 10/23/2017 0106   PROTEINUR NEGATIVE 10/23/2017 0106   UROBILINOGEN 0.2 12/15/2006 1654   NITRITE NEGATIVE 10/23/2017 0106   LEUKOCYTESUR MODERATE (A) 10/23/2017 0106   Recent Results (from the past 240 hour(s))  Urine Culture     Status: Abnormal   Collection Time: 10/23/17  9:13 AM  Result Value Ref Range Status   Specimen Description URINE, RANDOM  Final   Special Requests   Final    NONE Performed at [facility], [address] [place], [place] [address]     Culture >=100,000 COLONIES/mL AEROCOCCUS URINAE (A)  Final   Report Status 10/25/2017 FINAL  Final      Radiology Studies: No results found.  Scheduled Meds: . enoxaparin (LOVENOX) injection  30 mg Subcutaneous Q24H  . fluvoxaMINE  150 mg Per Tube QHS  . free water  30 mL Per Tube Q4H  . lithium citrate  300 mg Per Tube BID  . magnesium hydroxide  30 mL Per Tube Daily  . magnesium oxide  400 mg Per Tube BID  . megestrol  400 mg Per Tube Daily  . mirtazapine  45 mg Per Tube QHS  . OLANZapine zydis  10 mg Sublingual QHS  . phosphorus  500 mg Oral TID WC  . polyethylene glycol  17 g Per Tube BID  . sennosides  5 mL Per Tube QHS  . simvastatin  10 mg Per Tube q1800  . sodium chloride flush  3 mL Intravenous Q12H  . thiamine  100 mg Oral Daily  . vitamin C  250 mg Per Tube BID   Continuous Infusions: . sodium chloride 75 mL/hr at 10/29/17 0350  . feeding supplement (OSMOLITE 1.2 CAL) 1,000 mL (10/28/17  1426)     LOS: 24 days   Time spent: 25 minutes.  Tyrone Nine, MD [facility] [email or web address] Password [email or web address] 10/29/2017, 6:45 PM

## 2017-10-30 LAB — GLUCOSE, CAPILLARY
GLUCOSE-CAPILLARY: 102 mg/dL — AB (ref 70–99)
Glucose-Capillary: 125 mg/dL — ABNORMAL HIGH (ref 70–99)
Glucose-Capillary: 106 mg/dL — ABNORMAL HIGH (ref 70–99)
Glucose-Capillary: 109 mg/dL — ABNORMAL HIGH (ref 70–99)

## 2017-10-30 LAB — BASIC METABOLIC PANEL
Anion gap: 5 (ref 5–15)
BUN: 19 mg/dL (ref 8–23)
CO2: 28 mmol/L (ref 22–32)
CREATININE: 0.65 mg/dL (ref 0.44–1.00)
Calcium: 9.4 mg/dL (ref 8.9–10.3)
Chloride: 107 mmol/L (ref 98–111)
GLUCOSE: 143 mg/dL — AB (ref 70–99)
Potassium: 3.8 mmol/L (ref 3.5–5.1)
Sodium: 140 mmol/L (ref 135–145)

## 2017-10-30 LAB — MAGNESIUM: Magnesium: 2.1 mg/dL (ref 1.7–2.4)

## 2017-10-30 LAB — PHOSPHORUS: Phosphorus: 2.3 mg/dL — ABNORMAL LOW (ref 2.5–4.6)

## 2017-10-30 NOTE — Progress Notes (Addendum)
PROGRESS NOTE  Amanda Hall  FAO:[id number] DOB: [birth year] DOA: 10/05/2017 PCP: [name], MD   Brief Narrative: Amanda Hall is a 67 y.o. female with a history of depression and anxiety with recent psychiatric hospitalization who presented 9/25 with confusion, agitation, refusal to take medications or per oral intake found to have AKI and acute encephalopathy for which she was admitted. Psychiatry was consulted and guiding medical management. Due to mood-induced anorexia and severe malnutrition, cortrak was placed 10/7 and tube feedings started. Urine culture grew aerococcus for which ceftriaxone was started. The patient continues to have severe psychiatric symptoms, predominantly anxiety.  Assessment & Plan: Principal Problem:   MDD (major depressive disorder), recurrent severe, without psychosis (HCC) Active Problems:   MDD (major depressive disorder)   Anorexia   Dysphagia   Severe malnutrition (HCC)   Anxiety  Severe depression and anxiety:  - Continue zyprexa qHS and prn, fluvoxamine, mirtazepine (dose to assist with appetite) - Continue low dose benzodiazepine prn.  - Appreciate psychiatry's recommendations going forward. Needs psychiatric hospitalization, preferably at facility offering ECT. On [facility] waitlist. - Increased lithium 300mg  daily > BID on 10/17, level previously low, will recheck when at presumed steady state, 10/22.   Acute metabolic encephalopathy: Unclear etiology, possibly related to UTI with  contribution by mood disorder. TSH, HIV, ammonia wnl. Cortisol found to be low though ACTH stim test was appropriate/wnl. - Under IVC, continue sitter  Aerococcus UTI:  - Completed 4 days of ceftriaxone, remains afebrile without leukocytosis.  Severe protein calorie malnutrition due to anorexia due to depression (not nervosa): Vitamin D level 32, B12 438, thiamine normal at 114. Prealbumin is remarkably normal, reassuring that recent tube feeding has bolstered  nutritional status.  - Continue feeding through cortrak. Consideration for PEG tube placement brought on severe anxiety and is not currently planned. PO intake is slowly improving, remains very modest. - Continuing magnesium, phosphorus supplements.  Phos low but did not receive a dose yesterday, will continue to monitor. - Empirically giving vitamin C x5 days.   AKI and hypernatremia due to dehydration: Resolved. SIRS also noted on admission probably due to dehydration, since resolved, but do NOT believe the patient had sepsis.  - Continue tube feeding, free water. - Monitor BMP, Mg, Phos.  Constipation: Resolved.  - Continue regular bowel program.  Dysphagia: Continue offering dysphagia diet.  DVT prophylaxis: Lovenox Code Status: Full Family Communication: None at bedside. Disposition Plan: Awaiting placement at psychiatric facility. Remains on [facility] wait list.   Consultants:   Psychiatry  Procedures:   Cortrak placement 10/7  Antimicrobials:  Ceftriaxone 10/13 - 10/16   Subjective: More alert today and better eye contact. Conversant with less delay with conversational expression. Ate some ice cream again this morning, says it feels odd swallowing but has no pain or difficulty.   Objective: Vitals:   10/29/17 1714 10/29/17 2146 10/30/17 0550 10/30/17 0928  BP: 118/70 113/71 136/77 128/69  Pulse: 99 91 97 100  Resp:  16 16 18   Temp: 98.4 F (36.9 C) 97.9 F (36.6 C) 97.6 F (36.4 C) 98.4 F (36.9 C)  TempSrc: Oral Oral Oral Oral  SpO2:  97% 99% 97%  Weight:      Height:        Intake/Output Summary (Last 24 hours) at 10/30/2017 1344 Last data filed at 10/30/2017 1152 Gross per 24 hour  Intake 1120 ml  Output 1350 ml  Net -230 ml   Filed Weights   10/26/17 1252 10/26/17 2119  10/28/17 0500  Weight: 46.8 kg 46.8 kg 46.6 kg   Gen: Thin, chronically ill-appearing female in no distress Pulm: Nonlabored breathing room air. Clear. CV: Regular rate and rhythm.  No murmur, rub, or gallop. No JVD, no dependent edema. GI: Abdomen soft, non-tender, non-distended, with normoactive bowel sounds.  Ext: Warm, no deformities Skin: No rashes, lesions or ulcers on visualized skin.  Neuro: Alert and oriented. No focal neurological deficits. Psych: Judgement and insight appear impaired. Mood dysphoric and anxious.  Data Reviewed: I have personally reviewed following labs and imaging studies  CBC: Recent Labs  Lab 10/24/17 0834  WBC 5.6  HGB 11.9*  HCT 37.5  MCV 91.7  PLT 286   Basic Metabolic Panel: Recent Labs  Lab 10/24/17 0834 10/25/17 0417 10/27/17 0514 10/28/17 0551 10/30/17 0533  NA 142 142 139 140 140  K 4.5 3.5 3.8 3.5 3.8  CL 109 106 107 105 107  CO2 29 29 28 29 28   GLUCOSE 124* 107* 146* 109* 143*  BUN 16 20 16 17 19   CREATININE 0.67 0.69 0.59 0.62 0.65  CALCIUM 9.9 10.0 9.4 9.7 9.4  MG 2.1  --  2.0 2.1 2.1  PHOS 2.9 2.4* 2.4* 2.6 2.3*   GFR: Estimated Creatinine Clearance: 50.9 mL/min (by C-G formula based on SCr of 0.65 mg/dL).     Component Value Date/Time   COLORURINE AMBER (A) 10/23/2017 0106   APPEARANCEUR CLOUDY (A) 10/23/2017 0106   LABSPEC 1.009 10/23/2017 0106   PHURINE 9.0 (H) 10/23/2017 0106   GLUCOSEU NEGATIVE 10/23/2017 0106   HGBUR MODERATE (A) 10/23/2017 0106   BILIRUBINUR NEGATIVE 10/23/2017 0106   KETONESUR NEGATIVE 10/23/2017 0106   PROTEINUR NEGATIVE 10/23/2017 0106   UROBILINOGEN 0.2 12/15/2006 1654   NITRITE NEGATIVE 10/23/2017 0106   LEUKOCYTESUR MODERATE (A) 10/23/2017 0106   Recent Results (from the past 240 hour(s))  Urine Culture     Status: Abnormal   Collection Time: 10/23/17  9:13 AM  Result Value Ref Range Status   Specimen Description URINE, RANDOM  Final   Special Requests   Final    NONE Performed at [facility], [address], [place], [place] [address]    Culture >=100,000 COLONIES/mL AEROCOCCUS URINAE (A)  Final   Report Status 10/25/2017 FINAL  Final       Radiology Studies: No results found.  Scheduled Meds: . enoxaparin (LOVENOX) injection  30 mg Subcutaneous Q24H  . fluvoxaMINE  150 mg Per Tube QHS  . free water  30 mL Per Tube Q4H  . lithium citrate  300 mg Per Tube BID  . magnesium hydroxide  30 mL Per Tube Daily  . magnesium oxide  400 mg Per Tube BID  . megestrol  400 mg Per Tube Daily  . mirtazapine  45 mg Per Tube QHS  . OLANZapine zydis  10 mg Sublingual QHS  . phosphorus  500 mg Oral TID WC  . polyethylene glycol  17 g Per Tube BID  . sennosides  5 mL Per Tube QHS  . simvastatin  10 mg Per Tube q1800  . sodium chloride flush  3 mL Intravenous Q12H  . thiamine  100 mg Oral Daily  . vitamin C  250 mg Per Tube BID   Continuous Infusions: . sodium chloride 75 mL/hr at 10/29/17 2031  . feeding supplement (OSMOLITE 1.2 CAL) 1,000 mL (10/28/17 1426)     LOS: 25 days   Time spent: 25 minutes.  Tyrone Nine, MD [facility] [email or web address] Password [email or web address] 10/30/2017, 1:44 PM

## 2017-10-31 LAB — MAGNESIUM: Magnesium: 2.2 mg/dL (ref 1.7–2.4)

## 2017-10-31 LAB — GLUCOSE, CAPILLARY
Glucose-Capillary: 117 mg/dL — ABNORMAL HIGH (ref 70–99)
Glucose-Capillary: 123 mg/dL — ABNORMAL HIGH (ref 70–99)
Glucose-Capillary: 111 mg/dL — ABNORMAL HIGH (ref 70–99)

## 2017-10-31 LAB — BASIC METABOLIC PANEL
ANION GAP: 3 — AB (ref 5–15)
BUN: 17 mg/dL (ref 8–23)
CALCIUM: 9.6 mg/dL (ref 8.9–10.3)
CO2: 28 mmol/L (ref 22–32)
Chloride: 109 mmol/L (ref 98–111)
Creatinine, Ser: 0.65 mg/dL (ref 0.44–1.00)
GFR calc Af Amer: >60 mL/min (ref >60)
GFR calc non Af Amer: >60 mL/min (ref >60)
GLUCOSE: 142 mg/dL — AB (ref 70–99)
Potassium: 3.8 mmol/L (ref 3.5–5.1)
Sodium: 140 mmol/L (ref 135–145)

## 2017-10-31 LAB — PHOSPHORUS: Phosphorus: 2.8 mg/dL (ref 2.5–4.6)

## 2017-10-31 MED ORDER — VITAMIN C 500 MG PO TABS
250.0000 mg | ORAL_TABLET | Freq: Two times a day (BID) | ORAL | Status: DC
  Administered 2017-10-31 – 2017-12-27 (×114): 250 mg
  Filled 2017-10-31 (×114): qty 1

## 2017-10-31 NOTE — Progress Notes (Signed)
Physical Therapy Treatment Patient Details Name: Amanda Hall MRN: [id number] DOB: 27-Apr-1950 Today's Date: 10/31/2017    History of Present Illness Pt is a 67 y/o female admitted secondary to major depressive disorder. Pt with increased confusion and agitation prior to admission. Found to have AKI and acute encephalopathy. CT negative for acute abnormality. PMH includes anxiety and depression.     PT Comments    Pt progressing slowly towards physical therapy goals. Although pt incontinent she was able to tell therapist she has to void. I do think it would be beneficial to the patient to establish a toileting routine so she is getting the opportunity to void on the [facility] several times a day. Anxiety remains high but transfers were manageable with +2 assistance for balance support and safety.   Follow Up Recommendations  Supervision/Assistance - 24 hour;Other (comment)     Equipment Recommendations  None recommended by PT    Recommendations for Other Services       Precautions / Restrictions Precautions Precautions: Fall Precaution Comments: Recruitment consultant, cortrack tube Restrictions Weight Bearing Restrictions: No    Mobility  Bed Mobility Overal bed mobility: Needs Assistance Bed Mobility: Supine to Sit     Supine to sit: Mod assist     General bed mobility comments: Step-by-step cues to transition to EOB. Mod assist required for trunk elevation.  Transfers Overall transfer level: Needs assistance Equipment used: 2 person hand held assist Transfers: Sit to/from [employer] Sit to Stand: Mod assist;Max assist;+2 physical assistance Stand pivot transfers: +2 physical assistance;Mod assist       General transfer comment: +2 mod-max assist for power-up to full stand. Pt had difficulty advancing feet around when pivoting to chair. Somewhat resistant.  Ambulation/Gait             General Gait Details: Unable to progress to ambulating due to anxiety and  lack of RW in room.    Stairs             Wheelchair Mobility    Modified Rankin (Stroke Patients Only)       Balance Overall balance assessment: Needs assistance Sitting-balance support: Bilateral upper extremity supported Sitting balance-Leahy Scale: Fair Sitting balance - Comments: Able to sit unsupported, holding hand at time for comfort.  Postural control: Left lateral lean Standing balance support: During functional activity Standing balance-Leahy Scale: Zero Standing balance comment: +2 assist required.                             Cognition Arousal/Alertness: Awake/alert Behavior During Therapy: Anxious Overall Cognitive Status: No family/caregiver present to determine baseline cognitive functioning                                        Exercises      General Comments        Pertinent Vitals/Pain Pain Assessment: Faces Faces Pain Scale: Hurts a little bit Pain Location: Generalized pain with movement. Pain Descriptors / Indicators: Grimacing Pain Intervention(s): Monitored during session    Home Living                      Prior Function            PT Goals (current goals can now be found in the care plan section) Acute Rehab PT Goals Patient Stated Goal: did  not state PT Goal Formulation: Patient unable to participate in goal setting Time For Goal Achievement: 11/01/17 Potential to Achieve Goals: Fair Progress towards PT goals: Not progressing toward goals - comment    Frequency    Min 2X/week      PT Plan Current plan remains appropriate    Co-evaluation              AM-PAC PT "6 Clicks" Daily Activity  Outcome Measure  Difficulty turning over in bed (including adjusting bedclothes, sheets and blankets)?: Unable Difficulty moving from lying on back to sitting on the side of the bed? : Unable Difficulty sitting down on and standing up from a chair with arms (e.g., wheelchair, bedside  commode, etc,.)?: Unable Help needed moving to and from a bed to chair (including a wheelchair)?: A Lot Help needed walking in hospital room?: Total Help needed climbing 3-5 steps with a railing? : Total 6 Click Score: 7    End of Session Equipment Utilized During Treatment: Gait belt Activity Tolerance: (limited by anxiety) Patient left: in chair;with nursing/sitter in room Nurse Communication: Mobility status PT Visit Diagnosis: Unsteadiness on feet (R26.81);Muscle weakness (generalized) (M62.81)     Time: 1610-9604 PT Time Calculation (min) (ACUTE ONLY): 19 min  Charges:  $Gait Training: 8-22 mins                     Amanda Hall, PT, DPT Acute Rehabilitation Services Pager: [phone] Office: [phone]    Amanda Hall 10/31/2017, 1:21 PM

## 2017-10-31 NOTE — Clinical Social Work Note (Signed)
IV-c paperwork updated, signed by MD, notarized and faxed to [facility] office. [facility] paperwork in patient's chart. CSW will continue to follow-up with [facility] to ensure patient remains on waiting list.  Genelle Bal, MSW, LCSW Licensed Clinical Social Worker Clinical Social Work Department [employer] [phone]

## 2017-10-31 NOTE — Progress Notes (Signed)
PROGRESS NOTE  Amanda Hall  WUJ:[id number] DOB: 08-22-1950 DOA: 10/05/2017 PCP: [name], MD   Brief Narrative: Amanda Hall is a 67 y.o. female with a history of depression and anxiety with recent psychiatric hospitalization who presented 9/25 with confusion, agitation, refusal to take medications or per oral intake found to have AKI and acute encephalopathy for which she was admitted. Psychiatry was consulted and guiding medical management. Due to mood-induced anorexia and severe malnutrition, cortrak was placed 10/7 and tube feedings started. Urine culture grew aerococcus for which ceftriaxone was started. The patient continues to have severe psychiatric symptoms, predominantly anxiety.  Assessment & Plan: Principal Problem:   MDD (major depressive disorder), recurrent severe, without psychosis (HCC) Active Problems:   MDD (major depressive disorder)   Anorexia   Dysphagia   Severe malnutrition (HCC)   Anxiety  Severe depression and anxiety:  - Continue zyprexa qHS and prn, fluvoxamine, mirtazepine (dose to assist with appetite) - Continue low dose benzodiazepine prn.  - Appreciate psychiatry's recommendations going forward. Needs psychiatric hospitalization, preferably at facility offering ECT. On [facility] waitlist. - Increased lithium 300mg  daily > BID on 10/17, level previously low, will recheck when at presumed steady state in AM.   Acute metabolic encephalopathy: Unclear etiology, possibly related to UTI with  contribution by mood disorder. TSH, HIV, ammonia wnl. Cortisol found to be low though ACTH stim test was appropriate/wnl. - Under IVC (renewed 7 days last on 10/21), continue sitter.   Aerococcus UTI:  - Completed 4 days of ceftriaxone, remains afebrile without leukocytosis.  Severe protein calorie malnutrition due to anorexia due to depression (not nervosa): Vitamin D level 32, B12 438, thiamine normal at 114. Prealbumin is remarkably normal, reassuring that recent  tube feeding has bolstered nutritional status.  - Continue feeding through cortrak. Consideration for PEG tube placement brought on severe anxiety and is not currently planned. PO intake is slowly improving, remains very modest. - Continuing magnesium, phosphorus supplements.  Phos low but did not receive a dose yesterday, will continue to monitor. - Empirically giving vitamin C  AKI and hypernatremia due to dehydration: Resolved. SIRS also noted on admission probably due to dehydration, since resolved, but do NOT believe the patient had sepsis.  - Continue tube feeding, free water. - Monitor BMP, Mg, Phos. No longer at risk for refeeding syndrome, so may check intermittently.  Constipation: Resolved.  - Continue regular bowel program.  Dysphagia: Continue offering dysphagia diet. May eat essentially whatever she would like in an effort to improve po intake.  DVT prophylaxis: Lovenox Code Status: Full Family Communication: None at bedside. Disposition Plan: Awaiting placement at psychiatric facility. Remains on [facility] wait list. IVC paperwork renewed 10/31/2017 at 12:30pm.  Consultants:   Psychiatry  Procedures:   Cortrak placement 10/7  Antimicrobials:  Ceftriaxone 10/13 - 10/16   Subjective: Scared to eat foods still with generalized panic about nothing in particular. Refuses to be transferred if bed becomes available. She is able to swallow, but complains of dry mouth.  Objective: Vitals:   10/30/17 0928 10/30/17 2140 10/31/17 0500 10/31/17 0824  BP: 128/69 124/71 120/77 (!) 151/90  Pulse: 100 92 83 (!) 121  Resp: 18 16 18 17   Temp: 98.4 F (36.9 C) (!) 97.5 F (36.4 C) 98.4 F (36.9 C) 97.8 F (36.6 C)  TempSrc: Oral Oral Oral Oral  SpO2: 97% 99% 98% 96%  Weight:      Height:        Intake/Output Summary (Last 24 hours)  at 10/31/2017 1225 Last data filed at 10/31/2017 0949 Gross per 24 hour  Intake 998.75 ml  Output 1253 ml  Net -254.25 ml   Filed Weights    10/26/17 1252 10/26/17 2119 10/28/17 0500  Weight: 46.8 kg 46.8 kg 46.6 kg   Gen: 67 y.o. female in no distress Pulm: Nonlabored breathing room air. Clear. CV: Regular rate and rhythm. No murmur, rub, or gallop. No JVD, no dependent edema. GI: Abdomen soft, non-tender, non-distended, with normoactive bowel sounds.  Neuro: Alert and oriented. No focal neurological deficits. Psych: Severely anxious, tremulous, to the point of poor judgment and limited insight. Poor eye contact.  Data Reviewed: I have personally reviewed following labs and imaging studies  CBC: No results for input(s): WBC, NEUTROABS, HGB, HCT, MCV, PLT in the last 168 hours. Basic Metabolic Panel: Recent Labs  Lab 10/25/17 0417 10/27/17 0514 10/28/17 0551 10/30/17 0533 10/31/17 0420  NA 142 139 140 140 140  K 3.5 3.8 3.5 3.8 3.8  CL 106 107 105 107 109  CO2 29 28 29 28 28   GLUCOSE 107* 146* 109* 143* 142*  BUN 20 16 17 19 17   CREATININE 0.69 0.59 0.62 0.65 0.65  CALCIUM 10.0 9.4 9.7 9.4 9.6  MG  --  2.0 2.1 2.1 2.2  PHOS 2.4* 2.4* 2.6 2.3* 2.8   GFR: Estimated Creatinine Clearance: 50.9 mL/min (by C-G formula based on SCr of 0.65 mg/dL).     Component Value Date/Time   COLORURINE AMBER (A) 10/23/2017 0106   APPEARANCEUR CLOUDY (A) 10/23/2017 0106   LABSPEC 1.009 10/23/2017 0106   PHURINE 9.0 (H) 10/23/2017 0106   GLUCOSEU NEGATIVE 10/23/2017 0106   HGBUR MODERATE (A) 10/23/2017 0106   BILIRUBINUR NEGATIVE 10/23/2017 0106   KETONESUR NEGATIVE 10/23/2017 0106   PROTEINUR NEGATIVE 10/23/2017 0106   UROBILINOGEN 0.2 12/15/2006 1654   NITRITE NEGATIVE 10/23/2017 0106   LEUKOCYTESUR MODERATE (A) 10/23/2017 0106   Recent Results (from the past 240 hour(s))  Urine Culture     Status: Abnormal   Collection Time: 10/23/17  9:13 AM  Result Value Ref Range Status   Specimen Description URINE, RANDOM  Final   Special Requests   Final    NONE Performed at [facility], [address] [place],  [place] [address]    Culture >=100,000 COLONIES/mL AEROCOCCUS URINAE (A)  Final   Report Status 10/25/2017 FINAL  Final      Radiology Studies: No results found.  Scheduled Meds: . enoxaparin (LOVENOX) injection  30 mg Subcutaneous Q24H  . fluvoxaMINE  150 mg Per Tube QHS  . free water  30 mL Per Tube Q4H  . lithium citrate  300 mg Per Tube BID  . magnesium hydroxide  30 mL Per Tube Daily  . magnesium oxide  400 mg Per Tube BID  . megestrol  400 mg Per Tube Daily  . mirtazapine  45 mg Per Tube QHS  . OLANZapine zydis  10 mg Sublingual QHS  . phosphorus  500 mg Oral TID WC  . polyethylene glycol  17 g Per Tube BID  . sennosides  5 mL Per Tube QHS  . simvastatin  10 mg Per Tube q1800  . sodium chloride flush  3 mL Intravenous Q12H  . thiamine  100 mg Oral Daily   Continuous Infusions: . sodium chloride 75 mL/hr at 10/31/17 0600  . feeding supplement (OSMOLITE 1.2 CAL) 1,000 mL (10/31/17 0947)     LOS: 26 days   Time spent:  15 minutes.  Tyrone Nine, MD [facility] [email or web address] Password [facility] 10/31/2017, 12:25 PM

## 2017-11-01 LAB — BASIC METABOLIC PANEL
ANION GAP: 9 (ref 5–15)
BUN: 16 mg/dL (ref 8–23)
CHLORIDE: 106 mmol/L (ref 98–111)
CO2: 26 mmol/L (ref 22–32)
Calcium: 9.4 mg/dL (ref 8.9–10.3)
Creatinine, Ser: 0.55 mg/dL (ref 0.44–1.00)
GFR calc non Af Amer: >60 mL/min (ref >60)
Glucose, Bld: 114 mg/dL — ABNORMAL HIGH (ref 70–99)
POTASSIUM: 3.8 mmol/L (ref 3.5–5.1)
Sodium: 141 mmol/L (ref 135–145)

## 2017-11-01 LAB — GLUCOSE, CAPILLARY
GLUCOSE-CAPILLARY: 104 mg/dL — AB (ref 70–99)
GLUCOSE-CAPILLARY: 116 mg/dL — AB (ref 70–99)
GLUCOSE-CAPILLARY: 104 mg/dL — AB (ref 70–99)
Glucose-Capillary: 105 mg/dL — ABNORMAL HIGH (ref 70–99)

## 2017-11-01 LAB — LITHIUM LEVEL: LITHIUM LVL: 0.59 mmol/L — AB (ref 0.60–1.20)

## 2017-11-01 LAB — PHOSPHORUS: Phosphorus: 2.9 mg/dL (ref 2.5–4.6)

## 2017-11-01 LAB — MAGNESIUM: Magnesium: 2.1 mg/dL (ref 1.7–2.4)

## 2017-11-01 NOTE — Consult Note (Signed)
[facility] Psych Consult Progress Note  11/01/2017 1:29 PM Amanda Hall  MRN:  [id number] Subjective:   Amanda Hall was last seen by the psychiatry consult service on 10/12. Lithium was increased to 300 mg BID on 10/17 and level was 0.59 on 10/22.   On interview, Amanda Hall is tremulous and stutters while speaking. She reports that she is scared about leaving the hospital. She would like to go home. She does not want to go to a SNF. She later reports that she is scared to be on her own and her sister is unable to care for her. She was informed about the disposition plan and the goal to provide further treatment for her mental health problems. She denies SI, HI or AVH. She reports poor sleep. She denies taking naps throughout the day. She reports that she has been eating ice cream. She is unable to provide a list of foods that could be offered to increase her PO intake.   Principal Problem: MDD (major depressive disorder), recurrent severe, without psychosis ([place]) Diagnosis:   Patient Active Problem List   Diagnosis Date Noted  . Anxiety [F41.9] 10/17/2017  . Dysphagia [R13.10] 10/12/2017  . Severe malnutrition ([place]) [E43] 10/12/2017  . MDD (major depressive disorder), recurrent severe, without psychosis ([place]) [F33.2]   . Anorexia [R63.0] 10/05/2017  . Uremia [N19]   . Protein-calorie malnutrition, severe [E43] 08/29/2017  . Generalized anxiety disorder [F41.1] 05/26/2017  . MDD (major depressive disorder) [F32.9] 05/26/2017  . Hypercholesterolemia [E78.00] 03/22/2017  . Nonspecific (abnormal) findings on radiological and other examination of body structure [793] 12/26/2006  . NONSPECIFIC ABNORM FIND RAD&OTH EXAM LUNG FIELD [R93.0] 12/26/2006  . ANEMIA-IRON DEFICIENCY [D50.9] 12/15/2006  . Depression [F32.9] 12/15/2006  . Allergic rhinitis [J30.9] 12/15/2006   Total Time spent with patient: 15 minutes  Past Psychiatric History: MDD and anxiety.   Past Medical History:  Past Medical History:   Diagnosis Date  . Allergy   . Anemia   . Anxiety   . Depression   . Hemorrhoids     Past Surgical History:  Procedure Laterality Date  . arm surgery     Left  . TUBAL LIGATION     Family History:  Family History  Problem Relation Age of Onset  . Stroke Mother   . Hypertension Sister   . Cancer Neg Hx        breat and colon hx  . Diabetes Neg Hx        family   Family Psychiatric  History: Grandfather-depression and committed suicide.  Social History:  Social History   Substance and Sexual Activity  Alcohol Use No     Social History   Substance and Sexual Activity  Drug Use No    Social History   Socioeconomic History  . Marital status: Divorced    Spouse name: Not on file  . Number of children: Not on file  . Years of education: Not on file  . Highest education level: Not on file  Occupational History  . Not on file  Social Needs  . Financial resource strain: Not on file  . Food insecurity:    Worry: Not on file    Inability: Not on file  . Transportation needs:    Medical: Not on file    Non-medical: Not on file  Tobacco Use  . Smoking status: Never Smoker  . Smokeless tobacco: Never Used  Substance and Sexual Activity  . Alcohol use: No  . Drug use: No  .  Sexual activity: Not Currently  Lifestyle  . Physical activity:    Days per week: Not on file    Minutes per session: Not on file  . Stress: Not on file  Relationships  . Social connections:    Talks on phone: Not on file    Gets together: Not on file    Attends religious service: Not on file    Active member of club or organization: Not on file    Attends meetings of clubs or organizations: Not on file    Relationship status: Not on file  Other Topics Concern  . Not on file  Social History Narrative  . Not on file    Sleep: Fair  Appetite:  Poor  Current Medications: Current Facility-Administered Medications  Medication Dose Route Frequency Provider Last Rate Last Dose  .  0.9 %  sodium chloride infusion   Intravenous Continuous Regalado, Belkys A, MD 75 mL/hr at 11/01/17 0710    . acetaminophen (TYLENOL) tablet 650 mg  650 mg Oral Q6H PRN Samuella Cota, MD       Or  . acetaminophen (TYLENOL) suppository 650 mg  650 mg Rectal Q6H PRN Samuella Cota, MD      . clonazePAM Bobbye [place]) tablet 0.5 mg  0.5 mg Per Tube TID PRN Patrecia Pour, MD   0.5 mg at 10/31/17 1808  . enoxaparin (LOVENOX) injection 30 mg  30 mg Subcutaneous Q24H Samuella Cota, MD   30 mg at 11/01/17 0853  . feeding supplement (OSMOLITE 1.2 CAL) liquid 1,000 mL  1,000 mL Per Tube Continuous Regalado, Belkys A, MD 55 mL/hr at 11/01/17 0709 1,000 mL at 11/01/17 0709  . fluvoxaMINE (LUVOX) tablet 150 mg  150 mg Per Tube QHS Blount, Xenia T, NP   150 mg at 10/31/17 2247  . free water 30 mL  30 mL Per Tube Q4H Patrecia Pour, MD   30 mL at 11/01/17 1239  . lithium citrate 300 MG/5ML solution 300 mg  300 mg Per Tube BID Patrecia Pour, MD   300 mg at 11/01/17 0853  . magnesium hydroxide (MILK OF MAGNESIA) suspension 30 mL  30 mL Per Tube Daily Blount, Xenia T, NP   30 mL at 10/30/17 1019  . magnesium oxide (MAG-OX) tablet 400 mg  400 mg Per Tube BID Regalado, Belkys A, MD   400 mg at 11/01/17 0854  . megestrol (MEGACE) 400 MG/10ML suspension 400 mg  400 mg Per Tube Daily Blount, Xenia T, NP   400 mg at 11/01/17 0853  . milk and molasses enema  1 enema Rectal Daily PRN Regalado, Belkys A, MD      . mirtazapine (REMERON SOL-TAB) disintegrating tablet 45 mg  45 mg Per Tube QHS Blount, Xenia T, NP   45 mg at 10/31/17 2248  . naphazoline-glycerin (CLEAR EYES REDNESS) ophth solution 2 drop  2 drop Both Eyes QID PRN Samuella Cota, MD      . OLANZapine zydis (ZYPREXA) disintegrating tablet 10 mg  10 mg Sublingual QHS Blount, Scarlette Shorts T, NP   10 mg at 10/31/17 2247  . ondansetron (ZOFRAN) tablet 4 mg  4 mg Per Tube Q6H PRN Blount, Lolita Cram, NP       Or  . ondansetron (ZOFRAN) injection 4 mg  4 mg  Intravenous Q6H PRN Blount, Xenia T, NP      . phosphorus (K PHOS NEUTRAL) tablet 500 mg  500 mg Oral TID WC Patrecia Pour,  MD   500 mg at 11/01/17 1238  . polyethylene glycol (MIRALAX / GLYCOLAX) packet 17 g  17 g Per Tube BID Lovey Newcomer T, NP   17 g at 10/31/17 0030  . sennosides (SENOKOT) 8.8 MG/5ML syrup 5 mL  5 mL Per Tube QHS Blount, Xenia T, NP   5 mL at 10/31/17 0032  . simvastatin (ZOCOR) tablet 10 mg  10 mg Per Tube q1800 Lovey Newcomer T, NP   10 mg at 10/31/17 1808  . sodium chloride flush (NS) 0.9 % injection 3 mL  3 mL Intravenous Q12H Samuella Cota, MD   3 mL at 10/31/17 0033  . thiamine (VITAMIN B-1) tablet 100 mg  100 mg Oral Daily Patrecia Pour, MD   100 mg at 11/01/17 0854  . vitamin C (ASCORBIC ACID) tablet 250 mg  250 mg Per Tube BID Patrecia Pour, MD   250 mg at 11/01/17 4098    Lab Results:  Results for orders placed or performed during the hospital encounter of 10/05/17 (from the past 48 hour(s))  Glucose, capillary     Status: Abnormal   Collection Time: 10/30/17  4:22 PM  Result Value Ref Range   Glucose-Capillary 109 (H) 70 - 99 mg/dL  Glucose, capillary     Status: Abnormal   Collection Time: 10/30/17  9:41 PM  Result Value Ref Range   Glucose-Capillary 102 (H) 70 - 99 mg/dL  Basic metabolic panel     Status: Abnormal   Collection Time: 10/31/17  4:20 AM  Result Value Ref Range   Sodium 140 135 - 145 mmol/L   Potassium 3.8 3.5 - 5.1 mmol/L   Chloride 109 98 - 111 mmol/L   CO2 28 22 - 32 mmol/L   Glucose, Bld 142 (H) 70 - 99 mg/dL   BUN 17 8 - 23 mg/dL   Creatinine, Ser 0.65 0.44 - 1.00 mg/dL   Calcium 9.6 8.9 - 10.3 mg/dL   GFR calc non Af Amer >60 >60 mL/min   GFR calc Af Amer >60 >60 mL/min    Comment: (NOTE) The eGFR has been calculated using the CKD EPI equation. This calculation has not been validated in all clinical situations. eGFR's persistently <60 mL/min signify possible Chronic Kidney Disease.    Anion gap 3 (L) 5 - 15    Comment:  Performed at [facility], [place] [address] [place], [place] [address]  Magnesium     Status: None   Collection Time: 10/31/17  4:20 AM  Result Value Ref Range   Magnesium 2.2 1.7 - 2.4 mg/dL    Comment: Performed at [facility] [address] [place], [place] [address]  Phosphorus     Status: None   Collection Time: 10/31/17  4:20 AM  Result Value Ref Range   Phosphorus 2.8 2.5 - 4.6 mg/dL    Comment: Performed at [facility] [address], [place], [place] [address]  Glucose, capillary     Status: Abnormal   Collection Time: 10/31/17  8:26 AM  Result Value Ref Range   Glucose-Capillary 117 (H) 70 - 99 mg/dL  Glucose, capillary     Status: Abnormal   Collection Time: 10/31/17 12:26 PM  Result Value Ref Range   Glucose-Capillary 123 (H) 70 - 99 mg/dL  Glucose, capillary     Status: Abnormal   Collection Time: 10/31/17  5:08 PM  Result Value Ref Range   Glucose-Capillary 111 (H) 70 -  99 mg/dL  Basic metabolic panel     Status: Abnormal   Collection Time: 11/01/17  6:28 AM  Result Value Ref Range   Sodium 141 135 - 145 mmol/L   Potassium 3.8 3.5 - 5.1 mmol/L   Chloride 106 98 - 111 mmol/L   CO2 26 22 - 32 mmol/L   Glucose, Bld 114 (H) 70 - 99 mg/dL   BUN 16 8 - 23 mg/dL   Creatinine, Ser 0.55 0.44 - 1.00 mg/dL   Calcium 9.4 8.9 - 10.3 mg/dL   GFR calc non Af Amer >60 >60 mL/min   GFR calc Af Amer >60 >60 mL/min    Comment: (NOTE) The eGFR has been calculated using the CKD EPI equation. This calculation has not been validated in all clinical situations. eGFR's persistently <60 mL/min signify possible Chronic Kidney Disease.    Anion gap 9 5 - 15    Comment: Performed at [facility] [address], [place], [place] [address]  Magnesium     Status: None   Collection Time: 11/01/17  6:28 AM  Result Value Ref Range   Magnesium 2.1 1.7 - 2.4 mg/dL    Comment: Performed at [facility] [address] [place], [place] [address]  Phosphorus      Status: None   Collection Time: 11/01/17  6:28 AM  Result Value Ref Range   Phosphorus 2.9 2.5 - 4.6 mg/dL    Comment: Performed at [facility] [address] [place], [place] [address]  Lithium level     Status: Abnormal   Collection Time: 11/01/17  6:28 AM  Result Value Ref Range   Lithium Lvl 0.59 (L) 0.60 - 1.20 mmol/L    Comment: Performed at [facility] [address] [place], [place] [address]  Glucose, capillary     Status: Abnormal   Collection Time: 11/01/17  8:27 AM  Result Value Ref Range   Glucose-Capillary 105 (H) 70 - 99 mg/dL   Comment 1 Document in Chart     Blood Alcohol level:  Lab Results  Component Value Date   ETH <10 06/12/2017   ETH <10 05/25/2017    Musculoskeletal: Strength & Muscle Tone: Generalized weakness Gait & Station: unable to stand Patient leans: N/A  Psychiatric Specialty Exam: Physical Exam  Nursing note and vitals reviewed. Constitutional: She is oriented to person, place, and time. She appears well-developed.  Thin  HENT:  Head: Normocephalic and atraumatic.  Neck: Normal range of motion.  Respiratory: Effort normal.  Musculoskeletal: Normal range of motion.  Neurological: She is alert and oriented to person, place, and time.  Psychiatric: Her speech is normal. Judgment and thought content normal. Her mood appears anxious. She is slowed. Cognition and memory are impaired. She exhibits a depressed mood.    Review of Systems  Constitutional: Negative for chills and fever.  Gastrointestinal: Negative for abdominal pain, constipation, diarrhea, nausea and vomiting.  Psychiatric/Behavioral: Positive for depression. Negative for hallucinations, substance abuse and suicidal ideas. The patient is nervous/anxious and has insomnia.   All other systems reviewed and are negative.   Blood pressure 125/76, pulse 84, temperature 98.2 F (36.8 C), temperature source Axillary, resp. rate 18, height 5' 2"  (1.575 m), weight  46.6 kg, SpO2 100 %.Body mass index is 18.79 kg/m.  General Appearance: Fairly Groomed, elderly, cachetic, Caucasian female, wearing a hospital gown and lying in bed. NAD.   Eye Contact:  Poor  Speech:  Clear and  Coherent and Normal Rate although frequently stutters secondary to anxiety.   Volume:  Normal  Mood:  Anxious  Affect:  Congruent  Thought Process:  Goal Directed, Linear and Descriptions of Associations: Intact  Orientation:  Full (Time, Place, and Person)  Thought Content:  Logical and Rumination  Suicidal Thoughts:  No  Homicidal Thoughts:  No  Memory:  Immediate;   Good Recent;   Good Remote;   Good  Judgement:  Poor  Insight:  Shallow  Psychomotor Activity:  Decreased  Concentration:  Concentration: Good and Attention Span: Good  Recall:  Good  Fund of Knowledge:  Good  Language:  Good  Akathisia:  No  Handed:  Right  AIMS (if indicated):   N/A  Assets:  Communication Skills Housing Social Support  ADL's:  Impaired  Cognition:  Impaired due to psychiatric condition.   Sleep:   Fair    Assessment:  Latajah Thuman is a 66 y.o. female who was admitted with AKI secondary to poor PO intake. She continues to exhibit signs and symptoms of depression and anxiety including psychomotor retardation and poor appetite with significant weight loss requiring tube feeds. She continues to warrant inpatient psychiatric hospitalization for stabilization and treatment. She may benefit from a non medication treatment modality such as ECT since she appears to have refractory depression and anxiety.    Treatment Plan Summary: -Patient warrants inpatient psychiatric hospitalization for severe depression and anxiety causing significant impairments in daily functioning with poor PO intake and severe malnutrition. -Continue bedside sitter.  -Continue Lithium 300 mg BID for refractory depression/anxiety. -Continue Zyprexa 10 mg qhs for mood stabilization and anxiety, Luvox 150 mg qhsfor  depression and anxiety and Remeron 45 mg qhsfor depression and anxiety. -EKG reviewed and QTc 456 on 10/10. Please closely monitor when starting or increasing QTc prolonging agents.  -Patient is IVC'd so she may not leave the hospital.   -Will follow patient as needed.   Faythe Dingwall, DO 11/01/2017, 1:29 PM

## 2017-11-01 NOTE — Clinical Social Work Note (Signed)
[facility] contacted (4:17 pm) and confirmed that patient remains on the wait list. CSW will continue to follow and maintain contact with [facility] to assure patient remains on wait list.  Genelle Bal, MSW, LCSW Licensed Clinical Social Worker Clinical Social Work Department [employer] [phone]

## 2017-11-01 NOTE — Progress Notes (Signed)
PROGRESS NOTE  Amanda Hall  RJJ:[id number] DOB: 12-28-1950 DOA: 10/05/2017 PCP: [name], MD   Brief Narrative: Amanda Hall is a 67 y.o. female with a history of depression and anxiety with recent psychiatric hospitalization who presented 9/25 with confusion, agitation, refusal to take medications or per oral intake found to have AKI and acute encephalopathy for which she was admitted. Psychiatry was consulted and guiding medical management. Due to mood-induced anorexia and severe malnutrition, cortrak was placed 10/7 and tube feedings started. Urine culture grew aerococcus for which ceftriaxone was started. The patient continues to have severe psychiatric symptoms, predominantly anxiety, and [facility]   Assessment & Plan: Principal Problem:   MDD (major depressive disorder), recurrent severe, without psychosis (HCC) Active Problems:   MDD (major depressive disorder)   Anorexia   Dysphagia   Severe malnutrition (HCC)   Anxiety  Severe depression and anxiety:  - Continue zyprexa qHS and prn, fluvoxamine, mirtazepine (dose to assist with appetite) - Continue low dose benzodiazepine prn.  - Appreciate psychiatry's recommendations going forward. Needs psychiatric hospitalization, preferably at facility offering ECT. On [facility] waitlist. - Increased lithium 300mg  daily > BID on 10/17, level just below LLN on 10/22, defer to psychiatry regarding further dose changes. Asked for them to reevaluate today, and appreciate their help.  Acute metabolic encephalopathy: Unclear etiology, possibly related to UTI with  contribution by mood disorder. TSH, HIV, ammonia wnl. Cortisol found to be low though ACTH stim test was appropriate/wnl. - Under IVC (renewed for 7 days last on 10/21), continue sitter.   Aerococcus UTI:  - Completed 4 days of ceftriaxone, remains afebrile without leukocytosis.  Severe protein calorie malnutrition due to anorexia due  to depression (not nervosa): Vitamin D level 32, B12 438, thiamine normal at 114. Prealbumin is remarkably normal, reassuring that recent tube feeding has bolstered nutritional status.  - Continue feeding through cortrak. Consideration for PEG tube placement brought on severe anxiety and is not currently planned. PO intake is slowly improving, remains very modest. - Continuing magnesium, phosphorus supplements. - Empirically giving vitamin C  AKI and hypernatremia due to dehydration: Resolved. SIRS also noted on admission probably due to dehydration, since resolved, but do NOT believe the patient had sepsis.  - Continue tube feeding, free water. - Monitor BMP, Mg, Phos. No longer at risk for refeeding syndrome, so may check intermittently.  Constipation: Resolved.  - Continue regular bowel program.  Dysphagia: Continue offering dysphagia diet. May eat essentially whatever she would like in an effort to improve po intake.  DVT prophylaxis: Lovenox Code Status: Full Family Communication: None at bedside. Disposition Plan: Awaiting placement at psychiatric facility. Remains on [facility] wait list. IVC paperwork renewed 10/31/2017 at 12:30pm.  Consultants:   Psychiatry  Procedures:   Cortrak placement 10/7  Antimicrobials:  Ceftriaxone 10/13 - 10/16   Subjective: Feels "scared to death." Perseverating about fear of transferring from here. Anxiety is reported to be somewhat better but remains severe. Taking some ice cream at mealtimes, an improvement from prior.  Objective: Vitals:   10/31/17 1512 10/31/17 2025 10/31/17 2027 11/01/17 0830  BP: 129/76 124/70  125/76  Pulse: 96 87  84  Resp: 19 18  18   Temp: 97.8 F (36.6 C) (!) 97.5 F (36.4 C) 97.8 F (36.6 C) 98.2 F (36.8 C)  TempSrc: Oral Oral Oral Axillary  SpO2: 100% 100%  100%  Weight:      Height:  Intake/Output Summary (Last 24 hours) at 11/01/2017 1308 Last data filed at 11/01/2017 0946 Gross per 24 hour    Intake 60 ml  Output 750 ml  Net -690 ml   Filed Weights   10/26/17 1252 10/26/17 2119 10/28/17 0500  Weight: 46.8 kg 46.8 kg 46.6 kg   Gen: Thin, chronically ill-appearing, worried female in no distress HEENT: Right nare w/tube in place, bridle in place, no abrasion/bleeding. Pulm: Nonlabored breathing room air. Clear. CV: Regular rate and rhythm. No murmur, rub, or gallop. No JVD, no dependent edema. GI: Abdomen soft, non-tender, non-distended, with normoactive bowel sounds.  Ext: Warm, no deformities Skin: No rashes, lesions or ulcers on visualized skin.  Neuro: Alert and oriented. No focal neurological deficits. Psych: Severe anxiety impairing insight and judgment, slow to respond and frequent blocking. Poor eye contact.   Data Reviewed: I have personally reviewed following labs and imaging studies  CBC: No results for input(s): WBC, NEUTROABS, HGB, HCT, MCV, PLT in the last 168 hours. Basic Metabolic Panel: Recent Labs  Lab 10/27/17 0514 10/28/17 0551 10/30/17 0533 10/31/17 0420 11/01/17 0628  NA 139 140 140 140 141  K 3.8 3.5 3.8 3.8 3.8  CL 107 105 107 109 106  CO2 28 29 28 28 26   GLUCOSE 146* 109* 143* 142* 114*  BUN 16 17 19 17 16   CREATININE 0.59 0.62 0.65 0.65 0.55  CALCIUM 9.4 9.7 9.4 9.6 9.4  MG 2.0 2.1 2.1 2.2 2.1  PHOS 2.4* 2.6 2.3* 2.8 2.9   GFR: Estimated Creatinine Clearance: 50.9 mL/min (by C-G formula based on SCr of 0.55 mg/dL).     Component Value Date/Time   COLORURINE AMBER (A) 10/23/2017 0106   APPEARANCEUR CLOUDY (A) 10/23/2017 0106   LABSPEC 1.009 10/23/2017 0106   PHURINE 9.0 (H) 10/23/2017 0106   GLUCOSEU NEGATIVE 10/23/2017 0106   HGBUR MODERATE (A) 10/23/2017 0106   BILIRUBINUR NEGATIVE 10/23/2017 0106   KETONESUR NEGATIVE 10/23/2017 0106   PROTEINUR NEGATIVE 10/23/2017 0106   UROBILINOGEN 0.2 12/15/2006 1654   NITRITE NEGATIVE 10/23/2017 0106   LEUKOCYTESUR MODERATE (A) 10/23/2017 0106   Recent Results (from the past 240  hour(s))  Urine Culture     Status: Abnormal   Collection Time: 10/23/17  9:13 AM  Result Value Ref Range Status   Specimen Description URINE, RANDOM  Final   Special Requests   Final    NONE Performed at [facility], [address], [place], [place] [address]    Culture >=100,000 COLONIES/mL AEROCOCCUS URINAE (A)  Final   Report Status 10/25/2017 FINAL  Final      Radiology Studies: No results found.  Scheduled Meds: . enoxaparin (LOVENOX) injection  30 mg Subcutaneous Q24H  . fluvoxaMINE  150 mg Per Tube QHS  . free water  30 mL Per Tube Q4H  . lithium citrate  300 mg Per Tube BID  . magnesium hydroxide  30 mL Per Tube Daily  . magnesium oxide  400 mg Per Tube BID  . megestrol  400 mg Per Tube Daily  . mirtazapine  45 mg Per Tube QHS  . OLANZapine zydis  10 mg Sublingual QHS  . phosphorus  500 mg Oral TID WC  . polyethylene glycol  17 g Per Tube BID  . sennosides  5 mL Per Tube QHS  . simvastatin  10 mg Per Tube q1800  . sodium chloride flush  3 mL Intravenous Q12H  . thiamine  100 mg Oral Daily  .  vitamin C  250 mg Per Tube BID   Continuous Infusions: . sodium chloride 75 mL/hr at 11/01/17 0710  . feeding supplement (OSMOLITE 1.2 CAL) 1,000 mL (11/01/17 0709)     LOS: 27 days   Time spent: 25 minutes.  Tyrone Nine, MD [facility] [email or web address] Password [email or web address] 11/01/2017, 1:08 PM

## 2017-11-02 DIAGNOSIS — N171 Acute kidney failure with acute cortical necrosis: Secondary | ICD-10-CM

## 2017-11-02 LAB — GLUCOSE, CAPILLARY
GLUCOSE-CAPILLARY: 130 mg/dL — AB (ref 70–99)
GLUCOSE-CAPILLARY: 125 mg/dL — AB (ref 70–99)
GLUCOSE-CAPILLARY: 115 mg/dL — AB (ref 70–99)
GLUCOSE-CAPILLARY: 96 mg/dL (ref 70–99)

## 2017-11-02 NOTE — Progress Notes (Signed)
Nutrition Follow-up  DOCUMENTATION CODES:   Severe malnutrition in context of social or environmental circumstances, Underweight  INTERVENTION:   Tube Feeding:  Continue Osmolite 1.2 @ 55 ml/hr via Cortrak tube  Magic cup TID with meals, each supplement provides 290 kcal and 9 grams of protein   NUTRITION DIAGNOSIS:   Severe Malnutrition related to social / environmental circumstances(severe major depressive disorder, refractory depression, anxiety) as evidenced by severe fat depletion, severe muscle depletion.  Being addressed via TF   GOAL:   Patient will meet greater than or equal to 90% of their needs  Progressing  MONITOR:   PO intake, Supplement acceptance, Labs, Weight trends  REASON FOR ASSESSMENT:   Malnutrition Screening Tool    ASSESSMENT:   67 yo female admitted with AKI, dehydration, anorexia with acute metabolic encephalopathy, FTT. Pt has been refusing to eat, drink or take medications. Pt has severe protein calorie malnutrition. Pt with recent inpatient psych hospitalization 1 month ago. Pt reports difficulty swallowing although pt's sister believes this to be subjective. Pt with MDD, severe, with psych consult. PMH includes anxiety, depression, malnutrition  Pt is on the Wait List for bed at [facility]. Pt IVC, continues with 1:1 sitter.  Pt continues with severe anxiety  Last night patient had 2 small bites of ice cream and 3 bites of food. Lunch yesterday was 1 spoonful of Magic Cup. Per I/O, pt appears to be eating mostly 0% to bites of meals  Tolerating Osmolite 1.2 @ 55 ml/hr via Cortrak tube (placed 10/7)  Weight actually trending up  Labs: reviewed Meds: NS at 75 ml/hr, megace, lithium citrate, remeron, Vitamin C, thiamine, phosphorus   Diet Order:   Diet Order            DIET - DYS 1 Room service appropriate? Yes; Fluid consistency: Thin  Diet effective now              EDUCATION NEEDS:   Not appropriate for education at  this time  Skin:  Skin Assessment: Reviewed RN Assessment(no pressure ulcers noted)  Last BM:  10/23  Height:   Ht Readings from Last 1 Encounters:  10/05/17 5\' 2"  (1.575 m)    Weight:   Wt Readings from Last 1 Encounters:  10/28/17 46.6 kg    Ideal Body Weight:  50 kg  BMI:  Body mass index is 18.79 kg/m.  Estimated Nutritional Needs:   Kcal:  1430-1640 kcals   Protein:  72-82 g   Fluid:  >/= 1.4 L  Romelle Starcher MS, RD, LDN, CNSC [phone] Pager  [phone] Weekend/On-Call Pager

## 2017-11-02 NOTE — Progress Notes (Signed)
PROGRESS NOTE    Amanda Hall  ZDG:[id number] DOB: 15-Sep-1950 DOA: 10/05/2017 PCP: [name], MD    Brief Narrative:  67 y.o. female with a history of depression and anxiety with recent psychiatric hospitalization who presented 9/25 with confusion, agitation, refusal to take medications or per oral intake found to have AKI and acute encephalopathy for which she was admitted. Psychiatry was consulted and guiding medical management. Due to mood-induced anorexia and severe malnutrition, cortrak was placed 10/7 and tube feedings started. Urine culture grew aerococcus for which ceftriaxone was started. The patient continues to have severe psychiatric symptoms, predominantly anxiety, and is awaiting [facility].   Assessment & Plan:   Principal Problem:   MDD (major depressive disorder), recurrent severe, without psychosis (HCC) Active Problems:   MDD (major depressive disorder)   Anorexia   Dysphagia   Severe malnutrition (HCC)   Anxiety  Severe depression and anxiety:  - Continue zyprexa qHS and prn, fluvoxamine, mirtazepine (dose to assist with appetite) - Continue low dose benzodiazepine prn.  - Appreciate psychiatry's recommendations going forward. Needs psychiatric hospitalization, preferably at facility offering ECT. On [facility] wait[facility]. - Increased lithium 300mg  daily > BID on 10/17, level just below LLN on 10/22, defer to psychiatry regarding further dose changes. Stable at present. Awaiting placement to [facility]  Acute metabolic encephalopathy: Unclear etiology, possibly related to UTI with  contribution by mood disorder. TSH, HIV, ammonia wnl. Cortisol found to be low though ACTH stim test was appropriate/wnl. - Under IVC (renewed for 7 days last on 10/21), continue sitter.   Aerococcus UTI:  - Completed 4 days of ceftriaxone, remains afebrile without leukocytosis.  Severe protein calorie malnutrition due to anorexia due to depression (not  nervosa): Vitamin D level 32, B12 438, thiamine normal at 114. Prealbumin is remarkably normal, reassuring that recent tube feeding has bolstered nutritional status.  - Continue feeding through cortrak. Consideration for PEG tube placement brought on severe anxiety and is not currently planned. PO intake is slowly improving, remains very modest. - Continuing magnesium, phosphorus supplements. - Empirically giving vitamin C  AKI and hypernatremia due to dehydration: Resolved. SIRS also noted on admission probably due to dehydration, since resolved, but do NOT believe the patient had sepsis.  - Continue tube feeding, free water. - Monitor BMP, Mg, Phos. No longer at risk for refeeding syndrome, so may check intermittently.  Constipation: Resolved.  - Continue regular bowel program.  Dysphagia: Continue offering dysphagia diet. May eat essentially whatever she would like in an effort to improve po intake.  DVT prophylaxis: Lovenox subq Code Status: Full Family Communication: Pt in room, family not at bedside Disposition Plan: Transfer to inpatient behavioral health when bed ready  Consultants:   Psychiatry  Procedures:     Antimicrobials: Anti-infectives (From admission, onward)   Start     Dose/Rate Route Frequency Ordered Stop   10/23/17 0830  cefTRIAXone (ROCEPHIN) 1 g in sodium chloride 0.9 % 100 mL IVPB  Status:  Discontinued     1 g 200 mL/hr over 30 Minutes Intravenous Daily 10/23/17 0721 10/26/17 1400       Subjective: Without complaints at this time  Objective: Vitals:   11/02/17 0452 11/02/17 0453 11/02/17 0804 11/02/17 1728  BP: (!) 121/54  (!) 117/59 139/86  Pulse: 86  79 (!) 103  Resp:   16 20  Temp:  98.1 F (36.7 C) 98 F (36.7 C)   TempSrc:  Oral Oral   SpO2:  100%  99% 97%  Weight:      Height:        Intake/Output Summary (Last 24 hours) at 11/02/2017 1743 Last data filed at 11/02/2017 1317 Gross per 24 hour  Intake 160 ml  Output 900 ml    Net -740 ml   Filed Weights   10/26/17 1252 10/26/17 2119 10/28/17 0500  Weight: 46.8 kg 46.8 kg 46.6 kg    Examination:  General exam: Appears calm and comfortable  Respiratory system: Clear to auscultation. Respiratory effort normal. Cardiovascular system: S1 & S2 heard, RRR Gastrointestinal system: Abdomen is nondistended, soft and nontender. No organomegaly or masses felt. Normal bowel sounds heard. Central nervous system: Alert and oriented. No focal neurological deficits. Extremities: Symmetric 5 x 5 power. Skin: No rashes, lesions Psychiatry: Judgement and insight appear normal. Mood & affect appropriate.   Data Reviewed: I have personally reviewed following labs and imaging studies  CBC: No results for input(s): WBC, NEUTROABS, HGB, HCT, MCV, PLT in the last 168 hours. Basic Metabolic Panel: Recent Labs  Lab 10/27/17 0514 10/28/17 0551 10/30/17 0533 10/31/17 0420 11/01/17 0628  NA 139 140 140 140 141  K 3.8 3.5 3.8 3.8 3.8  CL 107 105 107 109 106  CO2 28 29 28 28 26   GLUCOSE 146* 109* 143* 142* 114*  BUN 16 17 19 17 16   CREATININE 0.59 0.62 0.65 0.65 0.55  CALCIUM 9.4 9.7 9.4 9.6 9.4  MG 2.0 2.1 2.1 2.2 2.1  PHOS 2.4* 2.6 2.3* 2.8 2.9   GFR: Estimated Creatinine Clearance: 50.9 mL/min (by C-G formula based on SCr of 0.55 mg/dL). Liver Function Tests: No results for input(s): AST, ALT, ALKPHOS, BILITOT, PROT, ALBUMIN in the last 168 hours. No results for input(s): LIPASE, AMYLASE in the last 168 hours. No results for input(s): AMMONIA in the last 168 hours. Coagulation Profile: No results for input(s): INR, PROTIME in the last 168 hours. Cardiac Enzymes: No results for input(s): CKTOTAL, CKMB, CKMBINDEX, TROPONINI in the last 168 hours. BNP (last 3 results) No results for input(s): PROBNP in the last 8760 hours. HbA1C: No results for input(s): HGBA1C in the last 72 hours. CBG: Recent Labs  Lab 11/01/17 1654 11/01/17 2138 11/02/17 0738  11/02/17 1124 11/02/17 1728  GLUCAP 116* 104* 130* 125* 115*   Lipid Profile: No results for input(s): CHOL, HDL, LDLCALC, TRIG, CHOLHDL, LDLDIRECT in the last 72 hours. Thyroid Function Tests: No results for input(s): TSH, T4TOTAL, FREET4, T3FREE, THYROIDAB in the last 72 hours. Anemia Panel: No results for input(s): VITAMINB12, FOLATE, FERRITIN, TIBC, IRON, RETICCTPCT in the last 72 hours. Sepsis Labs: No results for input(s): PROCALCITON, LATICACIDVEN in the last 168 hours.  No results found for this or any previous visit (from the past 240 hour(s)).   Radiology Studies: No results found.  Scheduled Meds: . enoxaparin (LOVENOX) injection  30 mg Subcutaneous Q24H  . fluvoxaMINE  150 mg Per Tube QHS  . free water  30 mL Per Tube Q4H  . lithium citrate  300 mg Per Tube BID  . magnesium hydroxide  30 mL Per Tube Daily  . magnesium oxide  400 mg Per Tube BID  . megestrol  400 mg Per Tube Daily  . mirtazapine  45 mg Per Tube QHS  . OLANZapine zydis  10 mg Sublingual QHS  . phosphorus  500 mg Oral TID WC  . polyethylene glycol  17 g Per Tube BID  . sennosides  5 mL Per Tube QHS  .  simvastatin  10 mg Per Tube q1800  . sodium chloride flush  3 mL Intravenous Q12H  . thiamine  100 mg Oral Daily  . vitamin C  250 mg Per Tube BID   Continuous Infusions: . sodium chloride 75 mL/hr at 11/02/17 0811  . feeding supplement (OSMOLITE 1.2 CAL) 1,000 mL (11/02/17 0818)     LOS: 28 days   Rickey Barbara, MD [facility] Pager On Amion  If 7PM-7AM, please contact night-coverage 11/02/2017, 5:43 PM

## 2017-11-02 NOTE — Progress Notes (Signed)
Occupational Therapy Treatment Patient Details Name: Amanda Hall MRN: [id number] DOB: [birth year] Today's Date: 11/02/2017    History of present illness Pt is a 67 y/o female admitted secondary to major depressive disorder. Pt with increased confusion and agitation prior to admission. Found to have AKI and acute encephalopathy. CT negative for acute abnormality. PMH includes anxiety and depression.    OT comments  Pt very anxious even at start of session but did participate with ongoing encouragement. It seemed to help pt when OT said she could squeeze OT's hand at times. Worked on functional use of UEs to reach for and hold cup for drinking out of straw today. Also worked on functionally using UEs for longer durations to work toward achieving thoroughness with washing face and other grooming tasks. Will continue to follow.     Follow Up Recommendations  Other (comment)([facility] with PT/OT)    Equipment Recommendations  (TBD next venue)    Recommendations for Other Services      Precautions / Restrictions Precautions Precautions: Fall Precaution Comments: Recruitment consultant, cortrack tube Restrictions Weight Bearing Restrictions: No       Mobility Bed Mobility                  Transfers    Not focused on this visit.                  Balance                                           ADL either performed or assessed with clinical judgement   ADL   Eating/Feeding: Moderate assistance;Bed level Eating/Feeding Details (indicate cue type and reason): pt extrememly shaky in bilateral UE. Pt able to initiate reaching out for cup with R UE and hold but needed mod assist to stabilize/support cup as she is so shaky and weak. She had difficulty with providing enough suction with her lips to obtain the drink from cup. She had to attempt several times before taking a sip. OT provided continuous support of cup to hold steady for her to be able to  functionally use straw.     Grooming Details (indicate cue type and reason): pt declined washing her face. She stated "I dont want to." Nursing tech did state pt had a bath earlier.                                      Vision Baseline Vision/History: Wears glasses     Perception     Praxis      Cognition Arousal/Alertness: Awake/alert Behavior During Therapy: Anxious Overall Cognitive Status: No family/caregiver present to determine baseline cognitive functioning                                          Exercises Other Exercises Other Exercises: bilateral shoulder exercises with AAROM in prep for functional tasks. Only able to maintain UEs off the bed for minimal time even with OT assist due to fatigue. Pt able to squeeze OT's hand with each of her hands when given command. This seemed to help her calm. Worked on bringing each hand toward mouth in prep for functional feeding task with  mod support by OT.  Other Exercises: Worked on turning head both R and L and pt stating this is uncomfortable at times. Tried to hold head to R and L for a few seconds each time to pt's tolerance. Performed x2 each direction,.    Shoulder Instructions       General Comments      Pertinent Vitals/ Pain       Pain Assessment: Faces Faces Pain Scale: Hurts a little bit Pain Location: generalized pain with UEs movement.  Pain Intervention(s): Monitored during session;Repositioned  Home Living                                          Prior Functioning/Environment              Frequency  Min 2X/week        Progress Toward Goals  OT Goals(current goals can now be found in the care plan section)  Progress towards OT goals: Progressing toward goals     Plan Discharge plan remains appropriate    Co-evaluation                 AM-PAC PT "6 Clicks" Daily Activity     Outcome Measure   Help from another person eating meals?:  Total Help from another person taking care of personal grooming?: Total Help from another person toileting, which includes using toliet, bedpan, or urinal?: Total Help from another person bathing (including washing, rinsing, drying)?: Total Help from another person to put on and taking off regular upper body clothing?: Total Help from another person to put on and taking off regular lower body clothing?: Total 6 Click Score: 6    End of Session    OT Visit Diagnosis: Other abnormalities of gait and mobility (R26.89);Muscle weakness (generalized) (M62.81);Pain   Activity Tolerance Patient limited by pain;Other (comment)(anxious)   Patient Left in bed;with call bell/phone within reach;with nursing/sitter in room   Nurse Communication          Time: 7829-5621 OT Time Calculation (min): 13 min  Charges: OT General Charges $OT Visit: 1 Visit OT Treatments $Self Care/Home Management : 8-22 mins     Judithann Sauger OTR/L Acute Rehabilitation [phone] office number

## 2017-11-03 LAB — GLUCOSE, CAPILLARY
GLUCOSE-CAPILLARY: 97 mg/dL (ref 70–99)
GLUCOSE-CAPILLARY: 111 mg/dL — AB (ref 70–99)
Glucose-Capillary: 148 mg/dL — ABNORMAL HIGH (ref 70–99)
Glucose-Capillary: 123 mg/dL — ABNORMAL HIGH (ref 70–99)

## 2017-11-03 LAB — BASIC METABOLIC PANEL
Anion gap: 5 (ref 5–15)
BUN: 14 mg/dL (ref 8–23)
CALCIUM: 9.2 mg/dL (ref 8.9–10.3)
CHLORIDE: 104 mmol/L (ref 98–111)
CO2: 28 mmol/L (ref 22–32)
CREATININE: 0.55 mg/dL (ref 0.44–1.00)
GFR calc Af Amer: >60 mL/min (ref >60)
GFR calc non Af Amer: >60 mL/min (ref >60)
Glucose, Bld: 137 mg/dL — ABNORMAL HIGH (ref 70–99)
Potassium: 3.2 mmol/L — ABNORMAL LOW (ref 3.5–5.1)
Sodium: 137 mmol/L (ref 135–145)

## 2017-11-03 LAB — MAGNESIUM: Magnesium: 1.9 mg/dL (ref 1.7–2.4)

## 2017-11-03 LAB — PHOSPHORUS: PHOSPHORUS: 2.7 mg/dL (ref 2.5–4.6)

## 2017-11-03 MED ORDER — LOPERAMIDE HCL 2 MG PO CAPS
2.0000 mg | ORAL_CAPSULE | ORAL | Status: DC | PRN
  Administered 2017-11-29 – 2017-12-22 (×4): 2 mg via ORAL
  Filled 2017-11-03 (×4): qty 1

## 2017-11-03 NOTE — Progress Notes (Signed)
MD notified that pt has had 3 loose bowel movements today.  Pt is on tube feeds.  No new orders at this time.  RN will continue to monitor.

## 2017-11-03 NOTE — Clinical Social Work Note (Signed)
Received a call this morning from [facility] screening unit. CSW confirmed with Marylene Land that patient still in hospital and continues to need a bed at [facility]. CSW will continue to follow and facilitate discharge to [facility] when a bed is available.  Genelle Bal, MSW, LCSW Licensed Clinical Social Worker Clinical Social Work Department [employer] [phone]

## 2017-11-03 NOTE — Progress Notes (Signed)
PROGRESS NOTE    Amanda Hall  ZOX:[id number] DOB: 1950/10/08 DOA: 10/05/2017 PCP: [name], MD    Brief Narrative:  67 y.o. female with a history of depression and anxiety with recent psychiatric hospitalization who presented 9/25 with confusion, agitation, refusal to take medications or per oral intake found to have AKI and acute encephalopathy for which she was admitted. Psychiatry was consulted and guiding medical management. Due to mood-induced anorexia and severe malnutrition, cortrak was placed 10/7 and tube feedings started. Urine culture grew aerococcus for which ceftriaxone was started. The patient continues to have severe psychiatric symptoms, predominantly anxiety, and is [facility].   Assessment & Plan:   Principal Problem:   MDD (major depressive disorder), recurrent severe, without psychosis (HCC) Active Problems:   MDD (major depressive disorder)   Anorexia   Dysphagia   Severe malnutrition (HCC)   Anxiety  Severe depression and anxiety:  - Continue zyprexa qHS and prn, fluvoxamine, mirtazepine (dose to assist with appetite) - Continue low dose benzodiazepine prn.  - Appreciate psychiatry's recommendations going forward. Needs psychiatric hospitalization, preferably at facility offering ECT. On [facility] waitlist. - Increased lithium 300mg  daily > BID on 10/17, level just below LLN on 10/22, defer to psychiatry regarding further dose changes. Stable at present. Still awaiting placement to [facility]  Acute metabolic encephalopathy: Unclear etiology, possibly related to UTI with  contribution by mood disorder. TSH, HIV, ammonia wnl. Cortisol found to be low though ACTH stim test was appropriate/wnl. - Under IVC (renewed for 7 days last on 10/21), continue sitter.   Aerococcus UTI:  - Completed 4 days of ceftriaxone, remains afebrile without leukocytosis.  Severe protein calorie malnutrition due to anorexia due to depression  (not nervosa): Vitamin D level 32, B12 438, thiamine normal at 114. Prealbumin is remarkably normal, reassuring that recent tube feeding has bolstered nutritional status.  - Continue feeding through cortrak. Consideration for PEG tube placement brought on severe anxiety and is not currently planned. PO intake is slowly improving, remains very modest. - Continuing magnesium, phosphorus supplements. - Empirically giving vitamin C  AKI and hypernatremia due to dehydration: Resolved. SIRS also noted on admission probably due to dehydration, since resolved, but do NOT believe the patient had sepsis.  - Continue tube feeding, free water. - Monitor BMP, Mg, Phos. No longer at risk for refeeding syndrome, so may check intermittently.  Constipation: Resolved.  - Continue regular bowel program.  Dysphagia: Continue offering dysphagia diet. May eat essentially whatever she would like in an effort to improve po intake.  DVT prophylaxis: Lovenox subq Code Status: Full Family Communication: Pt in room, family not at bedside Disposition Plan: Transfer to inpatient behavioral health when bed ready  Consultants:   Psychiatry  Procedures:     Antimicrobials: Anti-infectives (From admission, onward)   Start     Dose/Rate Route Frequency Ordered Stop   10/23/17 0830  cefTRIAXone (ROCEPHIN) 1 g in sodium chloride 0.9 % 100 mL IVPB  Status:  Discontinued     1 g 200 mL/hr over 30 Minutes Intravenous Daily 10/23/17 0721 10/26/17 1400      Subjective: No complaints at this time  Objective: Vitals:   11/02/17 2204 11/03/17 0500 11/03/17 0904 11/03/17 1733  BP: (!) 143/78 122/74 (!) 156/84 (!) 146/87  Pulse: (!) 107 98 (!) 107 94  Resp: 20  18 18   Temp: 97.9 F (36.6 C) (!) 94.8 F (34.9 C) 98.6 F (37 C) (!) 97.4 F (  36.3 C)  TempSrc: Oral Oral Oral Oral  SpO2: 95% 98% 94% 98%  Weight:      Height:        Intake/Output Summary (Last 24 hours) at 11/03/2017 1807 Last data filed at  11/03/2017 1227 Gross per 24 hour  Intake 1999.31 ml  Output 1400 ml  Net 599.31 ml   Filed Weights   10/26/17 1252 10/26/17 2119 10/28/17 0500  Weight: 46.8 kg 46.8 kg 46.6 kg    Examination: General exam: Awake, laying in bed, in nad Respiratory system: Normal respiratory effort, no wheezing Cardiovascular system: regular rate, s1, s2  Data Reviewed: I have personally reviewed following labs and imaging studies  CBC: No results for input(s): WBC, NEUTROABS, HGB, HCT, MCV, PLT in the last 168 hours. Basic Metabolic Panel: Recent Labs  Lab 10/28/17 0551 10/30/17 0533 10/31/17 0420 11/01/17 0628 11/03/17 0458  NA 140 140 140 141 137  K 3.5 3.8 3.8 3.8 3.2*  CL 105 107 109 106 104  CO2 29 28 28 26 28   GLUCOSE 109* 143* 142* 114* 137*  BUN 17 19 17 16 14   CREATININE 0.62 0.65 0.65 0.55 0.55  CALCIUM 9.7 9.4 9.6 9.4 9.2  MG 2.1 2.1 2.2 2.1 1.9  PHOS 2.6 2.3* 2.8 2.9 2.7   GFR: Estimated Creatinine Clearance: 50.9 mL/min (by C-G formula based on SCr of 0.55 mg/dL). Liver Function Tests: No results for input(s): AST, ALT, ALKPHOS, BILITOT, PROT, ALBUMIN in the last 168 hours. No results for input(s): LIPASE, AMYLASE in the last 168 hours. No results for input(s): AMMONIA in the last 168 hours. Coagulation Profile: No results for input(s): INR, PROTIME in the last 168 hours. Cardiac Enzymes: No results for input(s): CKTOTAL, CKMB, CKMBINDEX, TROPONINI in the last 168 hours. BNP (last 3 results) No results for input(s): PROBNP in the last 8760 hours. HbA1C: No results for input(s): HGBA1C in the last 72 hours. CBG: Recent Labs  Lab 11/02/17 1728 11/02/17 2209 11/03/17 0903 11/03/17 1257 11/03/17 1735  GLUCAP 115* 96 148* 123* 97   Lipid Profile: No results for input(s): CHOL, HDL, LDLCALC, TRIG, CHOLHDL, LDLDIRECT in the last 72 hours. Thyroid Function Tests: No results for input(s): TSH, T4TOTAL, FREET4, T3FREE, THYROIDAB in the last 72 hours. Anemia  Panel: No results for input(s): VITAMINB12, FOLATE, FERRITIN, TIBC, IRON, RETICCTPCT in the last 72 hours. Sepsis Labs: No results for input(s): PROCALCITON, LATICACIDVEN in the last 168 hours.  No results found for this or any previous visit (from the past 240 hour(s)).   Radiology Studies: No results found.  Scheduled Meds: . enoxaparin (LOVENOX) injection  30 mg Subcutaneous Q24H  . fluvoxaMINE  150 mg Per Tube QHS  . free water  30 mL Per Tube Q4H  . lithium citrate  300 mg Per Tube BID  . magnesium hydroxide  30 mL Per Tube Daily  . magnesium oxide  400 mg Per Tube BID  . megestrol  400 mg Per Tube Daily  . mirtazapine  45 mg Per Tube QHS  . OLANZapine zydis  10 mg Sublingual QHS  . phosphorus  500 mg Oral TID WC  . polyethylene glycol  17 g Per Tube BID  . sennosides  5 mL Per Tube QHS  . simvastatin  10 mg Per Tube q1800  . sodium chloride flush  3 mL Intravenous Q12H  . thiamine  100 mg Oral Daily  . vitamin C  250 mg Per Tube BID   Continuous Infusions: . sodium  chloride 75 mL/hr at 11/03/17 1125  . feeding supplement (OSMOLITE 1.2 CAL) 1,000 mL (11/02/17 2352)     LOS: 29 days   Rickey Barbara, MD [facility] Pager On Amion  If 7PM-7AM, please contact night-coverage 11/03/2017, 6:07 PM

## 2017-11-04 LAB — GLUCOSE, CAPILLARY
GLUCOSE-CAPILLARY: 128 mg/dL — AB (ref 70–99)
GLUCOSE-CAPILLARY: 95 mg/dL (ref 70–99)
Glucose-Capillary: 147 mg/dL — ABNORMAL HIGH (ref 70–99)
Glucose-Capillary: 115 mg/dL — ABNORMAL HIGH (ref 70–99)

## 2017-11-04 MED ORDER — LABETALOL HCL 5 MG/ML IV SOLN
5.0000 mg | INTRAVENOUS | Status: DC | PRN
  Administered 2017-11-04: 5 mg via INTRAVENOUS
  Filled 2017-11-04: qty 4

## 2017-11-04 NOTE — Progress Notes (Signed)
PROGRESS NOTE    Amanda Hall  WUJ:[id number] DOB: [birth year] DOA: 10/05/2017 PCP: [name], MD    Brief Narrative:  67 y.o. female with a history of depression and anxiety with recent psychiatric hospitalization who presented 9/25 with confusion, agitation, refusal to take medications or per oral intake found to have AKI and acute encephalopathy for which she was admitted. Psychiatry was consulted and guiding medical management. Due to mood-induced anorexia and severe malnutrition, cortrak was placed 10/7 and tube feedings started. Urine culture grew aerococcus for which ceftriaxone was started. The patient continues to have severe psychiatric symptoms, predominantly anxiety, and is awaiting a bed [facility].   Assessment & Plan:   Principal Problem:   MDD (major depressive disorder), recurrent severe, without psychosis (HCC) Active Problems:   MDD (major depressive disorder)   Anorexia   Dysphagia   Severe malnutrition (HCC)   Anxiety  Severe depression and anxiety:  - Continue zyprexa qHS and prn, fluvoxamine, mirtazepine (dose to assist with appetite) - Continue low dose benzodiazepine prn.  - Appreciate psychiatry's recommendations going forward. Needs psychiatric hospitalization, preferably at facility offering ECT. On [facility] waitlist. - Increased lithium 300mg  daily > BID on 10/17, level just below LLN on 10/22, defer to psychiatry regarding further dose changes. Stable at present. Pending [facility] at [facility]  Acute metabolic encephalopathy: Unclear etiology, possibly related to UTI with  contribution by mood disorder. TSH, HIV, ammonia wnl. Cortisol found to be low though ACTH stim test was appropriate/wnl. - Under IVC (renewed for 7 days last on 10/21), continue sitter.   Aerococcus UTI:  - Completed 4 days of ceftriaxone, remains afebrile without leukocytosis.  Severe protein calorie malnutrition due to anorexia due to depression (not  nervosa): Vitamin D level 32, B12 438, thiamine normal at 114. Prealbumin is remarkably normal, reassuring that recent tube feeding has bolstered nutritional status.  - Continue feeding through cortrak. Consideration for PEG tube placement brought on severe anxiety and is not currently planned. PO intake is slowly improving, remains very modest. - Continuing magnesium, phosphorus supplements. - Empirically giving vitamin C  AKI and hypernatremia due to dehydration: Resolved. SIRS also noted on admission probably due to dehydration, since resolved, but do NOT believe the patient had sepsis.  - Continue tube feeding, free water. - Monitor BMP, Mg, Phos. No longer at risk for refeeding syndrome, so may check intermittently.  Constipation: Resolved.  - Continue regular bowel program.  Dysphagia: Continue offering dysphagia diet. May eat essentially whatever she would like in an effort to improve po intake.  DVT prophylaxis: Lovenox subq Code Status: Full Family Communication: Pt in room, family not at bedside Disposition Plan: Transfer to inpatient behavioral health when bed ready  Consultants:   Psychiatry  Procedures:     Antimicrobials: Anti-infectives (From admission, onward)   Start     Dose/Rate Route Frequency Ordered Stop   10/23/17 0830  cefTRIAXone (ROCEPHIN) 1 g in sodium chloride 0.9 % 100 mL IVPB  Status:  Discontinued     1 g 200 mL/hr over 30 Minutes Intravenous Daily 10/23/17 0721 10/26/17 1400      Subjective: Without complaints  Objective: Vitals:   11/04/17 0513 11/04/17 0951 11/04/17 1139 11/04/17 1241  BP: 138/84 (!) 168/94 (!) 135/100 123/64  Pulse: (!) 108 (!) 113 (!) 119 81  Resp: 16 18    Temp: 98.1 F (36.7 C) 97.6 F (36.4 C)  98.1 F (36.7 C)  TempSrc: Oral Oral  Oral  SpO2: 97% 95% 95% 95%  Weight:      Height:        Intake/Output Summary (Last 24 hours) at 11/04/2017 1519 Last data filed at 11/04/2017 1300 Gross per 24 hour    Intake 2967.67 ml  Output 950 ml  Net 2017.67 ml   Filed Weights   10/26/17 1252 10/26/17 2119 10/28/17 0500  Weight: 46.8 kg 46.8 kg 46.6 kg    Examination: General exam: Awake, laying in bed, in nad Respiratory system: Normal respiratory effort, no wheezing Cardiovascular system: regular rate, s1, s2  Data Reviewed: I have personally reviewed following labs and imaging studies  CBC: No results for input(s): WBC, NEUTROABS, HGB, HCT, MCV, PLT in the last 168 hours. Basic Metabolic Panel: Recent Labs  Lab 10/30/17 0533 10/31/17 0420 11/01/17 0628 11/03/17 0458  NA 140 140 141 137  K 3.8 3.8 3.8 3.2*  CL 107 109 106 104  CO2 28 28 26 28   GLUCOSE 143* 142* 114* 137*  BUN 19 17 16 14   CREATININE 0.65 0.65 0.55 0.55  CALCIUM 9.4 9.6 9.4 9.2  MG 2.1 2.2 2.1 1.9  PHOS 2.3* 2.8 2.9 2.7   GFR: Estimated Creatinine Clearance: 50.9 mL/min (by C-G formula based on SCr of 0.55 mg/dL). Liver Function Tests: No results for input(s): AST, ALT, ALKPHOS, BILITOT, PROT, ALBUMIN in the last 168 hours. No results for input(s): LIPASE, AMYLASE in the last 168 hours. No results for input(s): AMMONIA in the last 168 hours. Coagulation Profile: No results for input(s): INR, PROTIME in the last 168 hours. Cardiac Enzymes: No results for input(s): CKTOTAL, CKMB, CKMBINDEX, TROPONINI in the last 168 hours. BNP (last 3 results) No results for input(s): PROBNP in the last 8760 hours. HbA1C: No results for input(s): HGBA1C in the last 72 hours. CBG: Recent Labs  Lab 11/03/17 1257 11/03/17 1735 11/03/17 2124 11/04/17 0756 11/04/17 1203  GLUCAP 123* 97 111* 147* 128*   Lipid Profile: No results for input(s): CHOL, HDL, LDLCALC, TRIG, CHOLHDL, LDLDIRECT in the last 72 hours. Thyroid Function Tests: No results for input(s): TSH, T4TOTAL, FREET4, T3FREE, THYROIDAB in the last 72 hours. Anemia Panel: No results for input(s): VITAMINB12, FOLATE, FERRITIN, TIBC, IRON, RETICCTPCT in the  last 72 hours. Sepsis Labs: No results for input(s): PROCALCITON, LATICACIDVEN in the last 168 hours.  No results found for this or any previous visit (from the past 240 hour(s)).   Radiology Studies: No results found.  Scheduled Meds: . enoxaparin (LOVENOX) injection  30 mg Subcutaneous Q24H  . fluvoxaMINE  150 mg Per Tube QHS  . free water  30 mL Per Tube Q4H  . lithium citrate  300 mg Per Tube BID  . magnesium hydroxide  30 mL Per Tube Daily  . magnesium oxide  400 mg Per Tube BID  . megestrol  400 mg Per Tube Daily  . mirtazapine  45 mg Per Tube QHS  . OLANZapine zydis  10 mg Sublingual QHS  . phosphorus  500 mg Oral TID WC  . polyethylene glycol  17 g Per Tube BID  . sennosides  5 mL Per Tube QHS  . simvastatin  10 mg Per Tube q1800  . sodium chloride flush  3 mL Intravenous Q12H  . thiamine  100 mg Oral Daily  . vitamin C  250 mg Per Tube BID   Continuous Infusions: . sodium chloride 75 mL/hr at 11/03/17 2208  . feeding supplement (OSMOLITE 1.2 CAL) 1,000 mL (11/03/17 2202)  LOS: 30 days   Rickey Barbara, MD [facility] Pager On Amion  If 7PM-7AM, please contact night-coverage 11/04/2017, 3:19 PM

## 2017-11-04 NOTE — Progress Notes (Signed)
Physical Therapy Treatment Patient Details Name: Amanda Hall MRN: [id number] DOB: 1950/01/30 Today's Date: 11/04/2017    History of Present Illness Pt is a 67 y/o female admitted secondary to major depressive disorder. Pt with increased confusion and agitation prior to admission. Found to have AKI and acute encephalopathy. CT negative for acute abnormality. PMH includes anxiety and depression.     PT Comments     Pt progressing slowly towards physical therapy goals. Anxiety continues to be the limiting factor of mobility and we have not made progress past basic transfers since initial evaluation. Today, pt initially agreeable to participate but then refused. Nursing staff and PT provided max encouragement for pt OOB and she was eventually somewhat agreeable again. Frequency decreased as pt is minimally willing to participate and not making progress towards physical therapy goals. This patient will remain on PT's caseload to monitor, and we will  continue to facilitate OOB mobility as able.    Follow Up Recommendations  Supervision/Assistance - 24 hour;Other (comment)     Equipment Recommendations  None recommended by PT    Recommendations for Other Services       Precautions / Restrictions Precautions Precautions: Fall Precaution Comments: Recruitment consultant, cortrack tube Restrictions Weight Bearing Restrictions: No    Mobility  Bed Mobility Overal bed mobility: Needs Assistance Bed Mobility: Supine to Sit     Supine to sit: Mod assist     General bed mobility comments: Pt transitioned to EOB with mod assist at bed pad to finish scooting activity.  Transfers Overall transfer level: Needs assistance Equipment used: 2 person hand held assist Transfers: Sit to/from [employer] Sit to Stand: Max assist;+2 physical assistance Stand pivot transfers: +2 physical assistance;Max assist       General transfer comment: +2 mod-max assist for power-up to full stand.  Pt had difficulty advancing feet around when pivoting to chair. Somewhat resistant.  Ambulation/Gait             General Gait Details: Unable to progress to ambulating due to anxiety and pt refusing   Stairs             Wheelchair Mobility    Modified Rankin (Stroke Patients Only)       Balance Overall balance assessment: Needs assistance Sitting-balance support: Bilateral upper extremity supported Sitting balance-Leahy Scale: Fair Sitting balance - Comments: Able to sit unsupported, holding hand at time for comfort.  Postural control: Left lateral lean Standing balance support: During functional activity Standing balance-Leahy Scale: Zero Standing balance comment: +2 assist required.                             Cognition Arousal/Alertness: Awake/alert Behavior During Therapy: Anxious Overall Cognitive Status: No family/caregiver present to determine baseline cognitive functioning                                 General Comments: Anxiety limiting session      Exercises      General Comments General comments (skin integrity, edema, etc.): Sitter present throughout session       Pertinent Vitals/Pain Pain Assessment: Faces Faces Pain Scale: Hurts a little bit Pain Location: generalized pain with movement.  Pain Descriptors / Indicators: Grimacing Pain Intervention(s): Monitored during session    Home Living  Prior Function            PT Goals (current goals can now be found in the care plan section) Acute Rehab PT Goals Patient Stated Goal: did not state PT Goal Formulation: Patient unable to participate in goal setting Time For Goal Achievement: 11/01/17 Potential to Achieve Goals: Fair Progress towards PT goals: Not progressing toward goals - comment    Frequency    Min 1X/week      PT Plan Frequency needs to be updated    Co-evaluation              AM-PAC PT "6 Clicks"  Daily Activity  Outcome Measure  Difficulty turning over in bed (including adjusting bedclothes, sheets and blankets)?: Unable Difficulty moving from lying on back to sitting on the side of the bed? : Unable Difficulty sitting down on and standing up from a chair with arms (e.g., wheelchair, bedside commode, etc,.)?: Unable Help needed moving to and from a bed to chair (including a wheelchair)?: A Lot Help needed walking in hospital room?: Total Help needed climbing 3-5 steps with a railing? : Total 6 Click Score: 7    End of Session Equipment Utilized During Treatment: Gait belt Activity Tolerance: (limited by anxiety) Patient left: in chair;with nursing/sitter in room Nurse Communication: Mobility status PT Visit Diagnosis: Unsteadiness on feet (R26.81);Muscle weakness (generalized) (M62.81)     Time: 1610-9604 PT Time Calculation (min) (ACUTE ONLY): 16 min  Charges:  $Therapeutic Activity: 8-22 mins                     [name], PT, DPT Acute Rehabilitation Services Pager: [phone] Office: [phone]    [name] 11/04/2017, 2:32 PM

## 2017-11-05 LAB — GLUCOSE, CAPILLARY
GLUCOSE-CAPILLARY: 112 mg/dL — AB (ref 70–99)
GLUCOSE-CAPILLARY: 116 mg/dL — AB (ref 70–99)
Glucose-Capillary: 105 mg/dL — ABNORMAL HIGH (ref 70–99)
Glucose-Capillary: 121 mg/dL — ABNORMAL HIGH (ref 70–99)

## 2017-11-05 NOTE — Progress Notes (Signed)
PROGRESS NOTE    Amanda Hall  WUJ:[id number] DOB: [birth year] DOA: 10/05/2017 PCP: [name], MD    Brief Narrative:  67 y.o. female with a history of depression and anxiety with recent psychiatric hospitalization who presented 9/25 with confusion, agitation, refusal to take medications or per oral intake found to have AKI and acute encephalopathy for which she was admitted. Psychiatry was consulted and guiding medical management. Due to mood-induced anorexia and severe malnutrition, cortrak was placed 10/7 and tube feedings started. Urine culture grew aerococcus for which ceftriaxone was started. The patient continues to have severe psychiatric symptoms, predominantly anxiety, and is [facility].   Assessment & Plan:   Principal Problem:   MDD (major depressive disorder), recurrent severe, without psychosis (HCC) Active Problems:   MDD (major depressive disorder)   Anorexia   Dysphagia   Severe malnutrition (HCC)   Anxiety  Severe depression and anxiety:  - Continue zyprexa qHS and prn, fluvoxamine, mirtazepine (dose to assist with appetite) - Continue low dose benzodiazepine prn.  - Appreciate psychiatry's recommendations going forward. Needs psychiatric hospitalization, preferably at facility offering ECT. On [facility] waitlist. - Increased lithium 300mg  daily > BID on 10/17, level just below LLN on 10/22, defer to psychiatry regarding further dose changes. Stable at present. Still awaiting placement to [facility]  Acute metabolic encephalopathy: Unclear etiology, possibly related to UTI with  contribution by mood disorder. TSH, HIV, ammonia wnl. Cortisol found to be low though ACTH stim test was appropriate/wnl. - Under IVC (renewed for 7 days last on 10/21), continue sitter.   Aerococcus UTI:  - Completed 4 days of ceftriaxone, remains afebrile without leukocytosis.  Severe protein calorie malnutrition due to anorexia due to depression  (not nervosa): Vitamin D level 32, B12 438, thiamine normal at 114. Prealbumin is remarkably normal, reassuring that recent tube feeding has bolstered nutritional status.  - Continue feeding through cortrak. Consideration for PEG tube placement brought on severe anxiety and is not currently planned. PO intake is slowly improving, remains very modest. - Continuing magnesium, phosphorus supplements. - Empirically giving vitamin C  AKI and hypernatremia due to dehydration: Resolved. SIRS also noted on admission probably due to dehydration, since resolved, but do NOT believe the patient had sepsis.  - Continue tube feeding, free water. - Monitor BMP, Mg, Phos. No longer at risk for refeeding syndrome, so may check intermittently.  Constipation: Resolved.  - Continue regular bowel program.  Dysphagia: Continue offering dysphagia diet. May eat essentially whatever she would like in an effort to improve po intake.  DVT prophylaxis: Lovenox subq Code Status: Full Family Communication: Pt in room, family not at bedside Disposition Plan: Transfer to inpatient behavioral health when bed ready  Consultants:   Psychiatry  Procedures:     Antimicrobials: Anti-infectives (From admission, onward)   Start     Dose/Rate Route Frequency Ordered Stop   10/23/17 0830  cefTRIAXone (ROCEPHIN) 1 g in sodium chloride 0.9 % 100 mL IVPB  Status:  Discontinued     1 g 200 mL/hr over 30 Minutes Intravenous Daily 10/23/17 0721 10/26/17 1400      Subjective: Pleasant this AM without complaints  Objective: Vitals:   11/04/17 1627 11/04/17 2058 11/05/17 0519 11/05/17 0900  BP: (!) 141/77 125/78 126/63 138/89  Pulse: (!) 104 100 82 95  Resp: 20 16 18 16   Temp: 98.4 F (36.9 C) 98.5 F (36.9 C) 97.6 F (36.4 C) 98.2 F (36.8 C)  TempSrc: Oral  Oral Oral Oral  SpO2: 93% 98% 99% 97%  Weight:      Height:        Intake/Output Summary (Last 24 hours) at 11/05/2017 1502 Last data filed at  11/05/2017 1400 Gross per 24 hour  Intake 2855.43 ml  Output 0 ml  Net 2855.43 ml   Filed Weights   10/26/17 1252 10/26/17 2119 10/28/17 0500  Weight: 46.8 kg 46.8 kg 46.6 kg    Examination: General exam: Conversant, in no acute distress Respiratory system: normal chest rise, clear, no audible wheezing Cardiovascular system: regular rhythm, s1-s2  Data Reviewed: I have personally reviewed following labs and imaging studies  CBC: No results for input(s): WBC, NEUTROABS, HGB, HCT, MCV, PLT in the last 168 hours. Basic Metabolic Panel: Recent Labs  Lab 10/30/17 0533 10/31/17 0420 11/01/17 0628 11/03/17 0458  NA 140 140 141 137  K 3.8 3.8 3.8 3.2*  CL 107 109 106 104  CO2 28 28 26 28   GLUCOSE 143* 142* 114* 137*  BUN 19 17 16 14   CREATININE 0.65 0.65 0.55 0.55  CALCIUM 9.4 9.6 9.4 9.2  MG 2.1 2.2 2.1 1.9  PHOS 2.3* 2.8 2.9 2.7   GFR: Estimated Creatinine Clearance: 50.9 mL/min (by C-G formula based on SCr of 0.55 mg/dL). Liver Function Tests: No results for input(s): AST, ALT, ALKPHOS, BILITOT, PROT, ALBUMIN in the last 168 hours. No results for input(s): LIPASE, AMYLASE in the last 168 hours. No results for input(s): AMMONIA in the last 168 hours. Coagulation Profile: No results for input(s): INR, PROTIME in the last 168 hours. Cardiac Enzymes: No results for input(s): CKTOTAL, CKMB, CKMBINDEX, TROPONINI in the last 168 hours. BNP (last 3 results) No results for input(s): PROBNP in the last 8760 hours. HbA1C: No results for input(s): HGBA1C in the last 72 hours. CBG: Recent Labs  Lab 11/04/17 1203 11/04/17 1624 11/04/17 2158 11/05/17 0911 11/05/17 1201  GLUCAP 128* 115* 95 112* 116*   Lipid Profile: No results for input(s): CHOL, HDL, LDLCALC, TRIG, CHOLHDL, LDLDIRECT in the last 72 hours. Thyroid Function Tests: No results for input(s): TSH, T4TOTAL, FREET4, T3FREE, THYROIDAB in the last 72 hours. Anemia Panel: No results for input(s): VITAMINB12,  FOLATE, FERRITIN, TIBC, IRON, RETICCTPCT in the last 72 hours. Sepsis Labs: No results for input(s): PROCALCITON, LATICACIDVEN in the last 168 hours.  No results found for this or any previous visit (from the past 240 hour(s)).   Radiology Studies: No results found.  Scheduled Meds: . enoxaparin (LOVENOX) injection  30 mg Subcutaneous Q24H  . fluvoxaMINE  150 mg Per Tube QHS  . free water  30 mL Per Tube Q4H  . lithium citrate  300 mg Per Tube BID  . magnesium hydroxide  30 mL Per Tube Daily  . magnesium oxide  400 mg Per Tube BID  . megestrol  400 mg Per Tube Daily  . mirtazapine  45 mg Per Tube QHS  . OLANZapine zydis  10 mg Sublingual QHS  . phosphorus  500 mg Oral TID WC  . polyethylene glycol  17 g Per Tube BID  . sennosides  5 mL Per Tube QHS  . simvastatin  10 mg Per Tube q1800  . sodium chloride flush  3 mL Intravenous Q12H  . thiamine  100 mg Oral Daily  . vitamin C  250 mg Per Tube BID   Continuous Infusions: . sodium chloride 75 mL/hr at 11/05/17 1000  . feeding supplement (OSMOLITE 1.2 CAL) 1,000 mL (11/04/17 2116)  LOS: 31 days   Rickey Barbara, MD [facility] Pager On Amion  If 7PM-7AM, please contact night-coverage 11/05/2017, 3:02 PM

## 2017-11-06 LAB — GLUCOSE, CAPILLARY
GLUCOSE-CAPILLARY: 116 mg/dL — AB (ref 70–99)
GLUCOSE-CAPILLARY: 133 mg/dL — AB (ref 70–99)
GLUCOSE-CAPILLARY: 101 mg/dL — AB (ref 70–99)
Glucose-Capillary: 136 mg/dL — ABNORMAL HIGH (ref 70–99)

## 2017-11-06 NOTE — Progress Notes (Signed)
PROGRESS NOTE    Amanda Hall  ZOX:[id number] DOB: 02/14/1950 DOA: 10/05/2017 PCP: [name], MD    Brief Narrative:  67 y.o. female with a history of depression and anxiety with recent psychiatric hospitalization who presented 9/25 with confusion, agitation, refusal to take medications or per oral intake found to have AKI and acute encephalopathy for which she was admitted. Psychiatry was consulted and guiding medical management. Due to mood-induced anorexia and severe malnutrition, cortrak was placed 10/7 and tube feedings started. Urine culture grew aerococcus for which ceftriaxone was started. The patient continues to have severe psychiatric symptoms, predominantly anxiety, and is awaiting a bed [facility].   Assessment & Plan:   Principal Problem:   MDD (major depressive disorder), recurrent severe, without psychosis (HCC) Active Problems:   MDD (major depressive disorder)   Anorexia   Dysphagia   Severe malnutrition (HCC)   Anxiety  Severe depression and anxiety:  - Continue zyprexa qHS and prn, fluvoxamine, mirtazepine (dose to assist with appetite) - Continue low dose benzodiazepine prn.  - Appreciate psychiatry's recommendations going forward. Needs psychiatric hospitalization, preferably at facility offering ECT. On [facility] wait[facility]. - Increased lithium 300mg  daily > BID on 10/17, level just below LLN on 10/22, defer to psychiatry regarding further dose changes. Stable at present. Awaiting placement to [facility]  Acute metabolic encephalopathy: Unclear etiology, possibly related to UTI with  contribution by mood disorder. TSH, HIV, ammonia wnl. Cortisol found to be low though ACTH stim test was appropriate/wnl. - Under IVC (renewed for 7 days last on 10/21), continue sitter.   Aerococcus UTI:  - Completed 4 days of ceftriaxone, remains afebrile without leukocytosis.  Severe protein calorie malnutrition due to anorexia due to depression (not  nervosa): Vitamin D level 32, B12 438, thiamine normal at 114. Prealbumin is remarkably normal, reassuring that recent tube feeding has bolstered nutritional status.  - Continue feeding through cortrak. Consideration for PEG tube placement brought on severe anxiety and is not currently planned. PO intake is slowly improving, remains very modest. - Continuing magnesium, phosphorus supplements. - Empirically giving vitamin C  AKI and hypernatremia due to dehydration: Resolved. SIRS also noted on admission probably due to dehydration, since resolved, but do NOT believe the patient had sepsis.  - Continue tube feeding, free water. - Monitor BMP, Mg, Phos. No longer at risk for refeeding syndrome, so may check intermittently.  Constipation: Resolved.  - Continue regular bowel program.  Dysphagia: Continue offering dysphagia diet. May eat essentially whatever she would like in an effort to improve po intake.  DVT prophylaxis: Lovenox subq Code Status: Full Family Communication: Pt in room, family not at bedside Disposition Plan: Transfer to inpatient behavioral health when bed ready  Consultants:   Psychiatry  Procedures:     Antimicrobials: Anti-infectives (From admission, onward)   Start     Dose/Rate Route Frequency Ordered Stop   10/23/17 0830  cefTRIAXone (ROCEPHIN) 1 g in sodium chloride 0.9 % 100 mL IVPB  Status:  Discontinued     1 g 200 mL/hr over 30 Minutes Intravenous Daily 10/23/17 0721 10/26/17 1400      Subjective: Wants vanilla ice cream  Objective: Vitals:   11/05/17 1947 11/06/17 0346 11/06/17 0500 11/06/17 0841  BP: 133/73 117/66  (!) 153/87  Pulse: 93 100  (!) 102  Resp: 16 18  17   Temp: 97.9 F (36.6 C) 99 F (37.2 C)  98.7 F (37.1 C)  TempSrc: Oral Oral  Axillary  SpO2:  100% 99%  99%  Weight:   46.6 kg   Height:        Intake/Output Summary (Last 24 hours) at 11/06/2017 1547 Last data filed at 11/06/2017 1318 Gross per 24 hour  Intake  1119.47 ml  Output 700 ml  Net 419.47 ml   Filed Weights   10/26/17 2119 10/28/17 0500 11/06/17 0500  Weight: 46.8 kg 46.6 kg 46.6 kg    Examination: General exam: Awake, laying in bed, in nad Respiratory system: Normal respiratory effort, no wheezing  Data Reviewed: I have personally reviewed following labs and imaging studies  CBC: No results for input(s): WBC, NEUTROABS, HGB, HCT, MCV, PLT in the last 168 hours. Basic Metabolic Panel: Recent Labs  Lab 10/31/17 0420 11/01/17 0628 11/03/17 0458  NA 140 141 137  K 3.8 3.8 3.2*  CL 109 106 104  CO2 28 26 28   GLUCOSE 142* 114* 137*  BUN 17 16 14   CREATININE 0.65 0.55 0.55  CALCIUM 9.6 9.4 9.2  MG 2.2 2.1 1.9  PHOS 2.8 2.9 2.7   GFR: Estimated Creatinine Clearance: 50.9 mL/min (by C-G formula based on SCr of 0.55 mg/dL). Liver Function Tests: No results for input(s): AST, ALT, ALKPHOS, BILITOT, PROT, ALBUMIN in the last 168 hours. No results for input(s): LIPASE, AMYLASE in the last 168 hours. No results for input(s): AMMONIA in the last 168 hours. Coagulation Profile: No results for input(s): INR, PROTIME in the last 168 hours. Cardiac Enzymes: No results for input(s): CKTOTAL, CKMB, CKMBINDEX, TROPONINI in the last 168 hours. BNP (last 3 results) No results for input(s): PROBNP in the last 8760 hours. HbA1C: No results for input(s): HGBA1C in the last 72 hours. CBG: Recent Labs  Lab 11/05/17 1201 11/05/17 1735 11/05/17 2234 11/06/17 0840 11/06/17 1148  GLUCAP 116* 105* 121* 116* 136*   Lipid Profile: No results for input(s): CHOL, HDL, LDLCALC, TRIG, CHOLHDL, LDLDIRECT in the last 72 hours. Thyroid Function Tests: No results for input(s): TSH, T4TOTAL, FREET4, T3FREE, THYROIDAB in the last 72 hours. Anemia Panel: No results for input(s): VITAMINB12, FOLATE, FERRITIN, TIBC, IRON, RETICCTPCT in the last 72 hours. Sepsis Labs: No results for input(s): PROCALCITON, LATICACIDVEN in the last 168 hours.  No  results found for this or any previous visit (from the past 240 hour(s)).   Radiology Studies: No results found.  Scheduled Meds: . enoxaparin (LOVENOX) injection  30 mg Subcutaneous Q24H  . fluvoxaMINE  150 mg Per Tube QHS  . free water  30 mL Per Tube Q4H  . lithium citrate  300 mg Per Tube BID  . magnesium hydroxide  30 mL Per Tube Daily  . magnesium oxide  400 mg Per Tube BID  . megestrol  400 mg Per Tube Daily  . mirtazapine  45 mg Per Tube QHS  . OLANZapine zydis  10 mg Sublingual QHS  . phosphorus  500 mg Oral TID WC  . polyethylene glycol  17 g Per Tube BID  . sennosides  5 mL Per Tube QHS  . simvastatin  10 mg Per Tube q1800  . sodium chloride flush  3 mL Intravenous Q12H  . thiamine  100 mg Oral Daily  . vitamin C  250 mg Per Tube BID   Continuous Infusions: . sodium chloride 75 mL/hr at 11/05/17 1800  . feeding supplement (OSMOLITE 1.2 CAL) 1,000 mL (11/05/17 1856)     LOS: 32 days   Rickey Barbara, MD [facility] Pager On Amion  If 7PM-7AM, please contact  night-coverage 11/06/2017, 3:47 PM

## 2017-11-07 LAB — GLUCOSE, CAPILLARY
GLUCOSE-CAPILLARY: 134 mg/dL — AB (ref 70–99)
GLUCOSE-CAPILLARY: 117 mg/dL — AB (ref 70–99)
Glucose-Capillary: 111 mg/dL — ABNORMAL HIGH (ref 70–99)
Glucose-Capillary: 113 mg/dL — ABNORMAL HIGH (ref 70–99)

## 2017-11-07 NOTE — Progress Notes (Signed)
PROGRESS NOTE    Amanda Hall  HYQ:[id number] DOB: [birth year] DOA: 10/05/2017 PCP: [name], MD    Brief Narrative:  67 y.o. female with a history of depression and anxiety with recent psychiatric hospitalization who presented 9/25 with confusion, agitation, refusal to take medications or per oral intake found to have AKI and acute encephalopathy for which she was admitted. Psychiatry was consulted and guiding medical management. Due to mood-induced anorexia and severe malnutrition, cortrak was placed 10/7 and tube feedings started. Urine culture grew aerococcus for which ceftriaxone was started. The patient continues to have severe psychiatric symptoms, predominantly anxiety, and is awaiting a bed [facility].   Assessment & Plan:   Principal Problem:   MDD (major depressive disorder), recurrent severe, without psychosis (HCC) Active Problems:   MDD (major depressive disorder)   Anorexia   Dysphagia   Severe malnutrition (HCC)   Anxiety  Severe depression and anxiety:  - Continue zyprexa qHS and prn, fluvoxamine, mirtazepine (dose to assist with appetite) - Continue low dose benzodiazepine prn.  - Appreciate psychiatry's recommendations going forward. Needs psychiatric hospitalization, preferably at facility offering ECT. On [facility] waitlist. - Increased lithium 300mg  daily > BID on 10/17, level just below LLN on 10/22, defer to psychiatry regarding further dose changes. Stable at present. Still awaiting tx to inpatient psych. Medically stable at this time  Acute metabolic encephalopathy: Unclear etiology, possibly related to UTI with  contribution by mood disorder. TSH, HIV, ammonia wnl. Cortisol found to be low though ACTH stim test was appropriate/wnl. - Under IVC (renewed for 7 days last on 10/21), continue sitter.   Aerococcus UTI:  - Completed 4 days of ceftriaxone, remains afebrile without leukocytosis.  Severe protein calorie  malnutrition due to anorexia due to depression (not nervosa): Vitamin D level 32, B12 438, thiamine normal at 114. Prealbumin is remarkably normal, reassuring that recent tube feeding has bolstered nutritional status.  - Continue feeding through cortrak. Consideration for PEG tube placement brought on severe anxiety and is not currently planned. PO intake is slowly improving, remains very modest. - Continuing magnesium, phosphorus supplements. - Empirically giving vitamin C  AKI and hypernatremia due to dehydration: Resolved. SIRS also noted on admission probably due to dehydration, since resolved, but do NOT believe the patient had sepsis.  - Continue tube feeding, free water. - Monitor BMP, Mg, Phos. No longer at risk for refeeding syndrome, so may check intermittently.  Constipation: Resolved.  - Continue regular bowel program.  Dysphagia: Continue offering dysphagia diet. May eat essentially whatever she would like in an effort to improve po intake.  DVT prophylaxis: Lovenox subq Code Status: Full Family Communication: Pt in room, family not at bedside Disposition Plan: Transfer to inpatient behavioral health when bed ready  Consultants:   Psychiatry  Procedures:     Antimicrobials: Anti-infectives (From admission, onward)   Start     Dose/Rate Route Frequency Ordered Stop   10/23/17 0830  cefTRIAXone (ROCEPHIN) 1 g in sodium chloride 0.9 % 100 mL IVPB  Status:  Discontinued     1 g 200 mL/hr over 30 Minutes Intravenous Daily 10/23/17 0721 10/26/17 1400      Subjective: Without complaints  Objective: Vitals:   11/06/17 2118 11/07/17 0500 11/07/17 0651 11/07/17 0941  BP: 114/71  (!) 143/83 130/66  Pulse: 92  95 72  Resp: 18  18 14   Temp: 97.8 F (36.6 C)  98.2 F (36.8 C) 98 F (36.7 C)  TempSrc: Oral  Oral Axillary  SpO2: 100%  99% 100%  Weight:  46.6 kg    Height:        Intake/Output Summary (Last 24 hours) at 11/07/2017 1530 Last data filed at  11/07/2017 1111 Gross per 24 hour  Intake 1480 ml  Output 2650 ml  Net -1170 ml   Filed Weights   10/28/17 0500 11/06/17 0500 11/07/17 0500  Weight: 46.6 kg 46.6 kg 46.6 kg    Examination: General exam: Conversant, in no acute distress Respiratory system: normal chest rise, clear, no audible wheezing  Data Reviewed: I have personally reviewed following labs and imaging studies  CBC: No results for input(s): WBC, NEUTROABS, HGB, HCT, MCV, PLT in the last 168 hours. Basic Metabolic Panel: Recent Labs  Lab 11/01/17 0628 11/03/17 0458  NA 141 137  K 3.8 3.2*  CL 106 104  CO2 26 28  GLUCOSE 114* 137*  BUN 16 14  CREATININE 0.55 0.55  CALCIUM 9.4 9.2  MG 2.1 1.9  PHOS 2.9 2.7   GFR: Estimated Creatinine Clearance: 50.9 mL/min (by C-G formula based on SCr of 0.55 mg/dL). Liver Function Tests: No results for input(s): AST, ALT, ALKPHOS, BILITOT, PROT, ALBUMIN in the last 168 hours. No results for input(s): LIPASE, AMYLASE in the last 168 hours. No results for input(s): AMMONIA in the last 168 hours. Coagulation Profile: No results for input(s): INR, PROTIME in the last 168 hours. Cardiac Enzymes: No results for input(s): CKTOTAL, CKMB, CKMBINDEX, TROPONINI in the last 168 hours. BNP (last 3 results) No results for input(s): PROBNP in the last 8760 hours. HbA1C: No results for input(s): HGBA1C in the last 72 hours. CBG: Recent Labs  Lab 11/06/17 1148 11/06/17 1716 11/06/17 2122 11/07/17 0749 11/07/17 1131  GLUCAP 136* 133* 101* 111* 113*   Lipid Profile: No results for input(s): CHOL, HDL, LDLCALC, TRIG, CHOLHDL, LDLDIRECT in the last 72 hours. Thyroid Function Tests: No results for input(s): TSH, T4TOTAL, FREET4, T3FREE, THYROIDAB in the last 72 hours. Anemia Panel: No results for input(s): VITAMINB12, FOLATE, FERRITIN, TIBC, IRON, RETICCTPCT in the last 72 hours. Sepsis Labs: No results for input(s): PROCALCITON, LATICACIDVEN in the last 168 hours.  No  results found for this or any previous visit (from the past 240 hour(s)).   Radiology Studies: No results found.  Scheduled Meds: . enoxaparin (LOVENOX) injection  30 mg Subcutaneous Q24H  . fluvoxaMINE  150 mg Per Tube QHS  . free water  30 mL Per Tube Q4H  . lithium citrate  300 mg Per Tube BID  . magnesium hydroxide  30 mL Per Tube Daily  . magnesium oxide  400 mg Per Tube BID  . megestrol  400 mg Per Tube Daily  . mirtazapine  45 mg Per Tube QHS  . OLANZapine zydis  10 mg Sublingual QHS  . phosphorus  500 mg Oral TID WC  . polyethylene glycol  17 g Per Tube BID  . sennosides  5 mL Per Tube QHS  . simvastatin  10 mg Per Tube q1800  . sodium chloride flush  3 mL Intravenous Q12H  . thiamine  100 mg Oral Daily  . vitamin C  250 mg Per Tube BID   Continuous Infusions: . sodium chloride 75 mL/hr at 11/06/17 1743  . feeding supplement (OSMOLITE 1.2 CAL) 1,000 mL (11/07/17 1400)     LOS: 33 days   Rickey Barbara, MD [facility] Pager On Amion  If 7PM-7AM, please contact night-coverage 11/07/2017, 3:30 PM

## 2017-11-07 NOTE — Clinical Social Work Note (Addendum)
Patient continues to need inpatient psychiatric placement and IVC paperwork updated today and was placed in patient's chart. CSW received call from [facility] and confirmed that patient remains in hospital and in need of psych placement. CSW will continue to follow and provided SW intervention services as needed.  *IVC paperwork will need to be updated again on 11/4 if continues to need psychiatric placement.  Genelle Bal, MSW, LCSW Licensed Clinical Social Worker Clinical Social Work Department [employer] [phone]

## 2017-11-08 LAB — GLUCOSE, CAPILLARY
GLUCOSE-CAPILLARY: 107 mg/dL — AB (ref 70–99)
Glucose-Capillary: 121 mg/dL — ABNORMAL HIGH (ref 70–99)
Glucose-Capillary: 130 mg/dL — ABNORMAL HIGH (ref 70–99)
Glucose-Capillary: 125 mg/dL — ABNORMAL HIGH (ref 70–99)

## 2017-11-08 LAB — BASIC METABOLIC PANEL
Anion gap: 5 (ref 5–15)
BUN: 13 mg/dL (ref 8–23)
CO2: 29 mmol/L (ref 22–32)
Calcium: 9.1 mg/dL (ref 8.9–10.3)
Chloride: 109 mmol/L (ref 98–111)
Creatinine, Ser: 0.49 mg/dL (ref 0.44–1.00)
GFR calc Af Amer: >60 mL/min (ref >60)
GFR calc non Af Amer: >60 mL/min (ref >60)
Glucose, Bld: 136 mg/dL — ABNORMAL HIGH (ref 70–99)
Potassium: 3.8 mmol/L (ref 3.5–5.1)
SODIUM: 143 mmol/L (ref 135–145)

## 2017-11-08 LAB — MAGNESIUM: MAGNESIUM: 2 mg/dL (ref 1.7–2.4)

## 2017-11-08 NOTE — Progress Notes (Signed)
Physical Therapy Treatment Patient Details Name: Amanda Hall MRN: [id number] DOB: [birth year] Today's Date: 11/08/2017    History of Present Illness Pt is a 67 y/o female admitted secondary to major depressive disorder. Pt with increased confusion and agitation prior to admission. Found to have AKI and acute encephalopathy. CT negative for acute abnormality. PMH includes anxiety and depression.     PT Comments    Mobility continues to be limited. Pt verbally refusing OOB but initiates movement with tactile cues. Pt has not been OOB in several days and truly feel she could benefit from transfer to chair, so max encouragement provided to complete task. Functionally the pt is not making progress towards physical therapy goals. Will continue to follow.   Follow Up Recommendations  Supervision/Assistance - 24 hour;Other (comment)     Equipment Recommendations  None recommended by PT    Recommendations for Other Services       Precautions / Restrictions Precautions Precautions: Fall Precaution Comments: Recruitment consultant, cortrack tube Restrictions Weight Bearing Restrictions: No    Mobility  Bed Mobility Overal bed mobility: Needs Assistance Bed Mobility: Supine to Sit     Supine to sit: Mod assist;HOB elevated     General bed mobility comments: Pt initiated trunk flexion to get into long sitting however had difficulty advancing LE's to EOB. Bed pad utilized to assist pt in scooting around fully to EOB and getting feet on floor.   Transfers Overall transfer level: Needs assistance Equipment used: 2 person hand held assist Transfers: Sit to/from [employer] Sit to Stand: Mod assist;+2 physical assistance Stand pivot transfers: Max assist;+2 physical assistance       General transfer comment: +2 mod-max assist for power-up to full stand as well as pivot transfer to recliner. . Pt had difficulty advancing feet around when pivoting to chair. Somewhat  resistant.  Ambulation/Gait             General Gait Details: Unable to progress to ambulating due to anxiety and pt refusing   Stairs             Wheelchair Mobility    Modified Rankin (Stroke Patients Only)       Balance Overall balance assessment: Needs assistance Sitting-balance support: Bilateral upper extremity supported Sitting balance-Leahy Scale: Fair Sitting balance - Comments: Able to sit unsupported, holding hand at time for comfort.  Postural control: Left lateral lean Standing balance support: During functional activity Standing balance-Leahy Scale: Zero Standing balance comment: +2 assist required.                             Cognition Arousal/Alertness: Awake/alert Behavior During Therapy: Anxious Overall Cognitive Status: No family/caregiver present to determine baseline cognitive functioning                                 General Comments: Anxiety limiting session      Exercises      General Comments        Pertinent Vitals/Pain Pain Assessment: Faces Faces Pain Scale: Hurts a little bit Pain Location: generalized pain with movement.  Pain Descriptors / Indicators: Grimacing Pain Intervention(s): Monitored during session    Home Living                      Prior Function            PT  Goals (current goals can now be found in the care plan section) Acute Rehab PT Goals Patient Stated Goal: did not state PT Goal Formulation: Patient unable to participate in goal setting Time For Goal Achievement: 11/01/17 Potential to Achieve Goals: Fair Progress towards PT goals: Progressing toward goals    Frequency    Min 1X/week      PT Plan Frequency needs to be updated    Co-evaluation              AM-PAC PT "6 Clicks" Daily Activity  Outcome Measure  Difficulty turning over in bed (including adjusting bedclothes, sheets and blankets)?: Unable Difficulty moving from lying on back  to sitting on the side of the bed? : Unable Difficulty sitting down on and standing up from a chair with arms (e.g., wheelchair, bedside commode, etc,.)?: Unable Help needed moving to and from a bed to chair (including a wheelchair)?: A Lot Help needed walking in hospital room?: Total Help needed climbing 3-5 steps with a railing? : Total 6 Click Score: 7    End of Session Equipment Utilized During Treatment: Gait belt Activity Tolerance: Other (comment)(limited by anxiety ) Patient left: in chair;with nursing/sitter in room Nurse Communication: Mobility status PT Visit Diagnosis: Unsteadiness on feet (R26.81);Muscle weakness (generalized) (M62.81)     Time: 4098-1191 PT Time Calculation (min) (ACUTE ONLY): 17 min  Charges:  $Gait Training: 8-22 mins                     [name], PT, DPT Acute Rehabilitation Services Pager: [phone] Office: [phone]    [name] 11/08/2017, 2:47 PM

## 2017-11-08 NOTE — Progress Notes (Signed)
PROGRESS NOTE    Amanda Hall  ZOX:[id number] DOB: December 20, 1950 DOA: 10/05/2017 PCP: [name], MD    Brief Narrative:  67 y.o. female with a history of depression and anxiety with recent psychiatric hospitalization who presented 9/25 with confusion, agitation, refusal to take medications or per oral intake found to have AKI and acute encephalopathy for which she was admitted. Psychiatry was consulted and guiding medical management. Due to mood-induced anorexia and severe malnutrition, cortrak was placed 10/7 and tube feedings started. Urine culture grew aerococcus for which ceftriaxone was started. The patient continues to have severe psychiatric symptoms, predominantly anxiety, and is [facility].   Assessment & Plan:   Principal Problem:   MDD (major depressive disorder), recurrent severe, without psychosis (HCC) Active Problems:   MDD (major depressive disorder)   Anorexia   Dysphagia   Severe malnutrition (HCC)   Anxiety  Severe depression and anxiety:  - Continue zyprexa qHS and prn, fluvoxamine, mirtazepine (dose to assist with appetite) - Continue low dose benzodiazepine prn.  - Appreciate psychiatry's recommendations going forward. Needs psychiatric hospitalization, preferably at facility offering ECT. On [facility] waitlist. - Increased lithium 300mg  daily > BID on 10/17, level just below LLN on 10/22, defer to psychiatry regarding further dose changes. Stable at present. Remains waiting for inpt psych transfer to [facility]. SW following  Acute metabolic encephalopathy: Unclear etiology, possibly related to UTI with  contribution by mood disorder. TSH, HIV, ammonia wnl. Cortisol found to be low though ACTH stim test was appropriate/wnl. - Under IVC (renewed for 7 days last on 10/21), continue sitter.   Aerococcus UTI:  - Completed 4 days of ceftriaxone, remains afebrile without leukocytosis.  Severe protein calorie malnutrition due  to anorexia due to depression (not nervosa): Vitamin D level 32, B12 438, thiamine normal at 114. Prealbumin is remarkably normal, reassuring that recent tube feeding has bolstered nutritional status.  - Continue feeding through cortrak. Consideration for PEG tube placement brought on severe anxiety and is not currently planned. PO intake is slowly improving, remains very modest. - Continuing magnesium, phosphorus supplements. - Empirically giving vitamin C  AKI and hypernatremia due to dehydration: Resolved. SIRS also noted on admission probably due to dehydration, since resolved, but do NOT believe the patient had sepsis.  - Continue tube feeding, free water. - Monitor BMP, Mg, Phos. No longer at risk for refeeding syndrome, so may check intermittently.  Constipation: Resolved.  - Continue regular bowel program.  Dysphagia: Continue offering dysphagia diet. May eat essentially whatever she would like in an effort to improve po intake.  DVT prophylaxis: Lovenox subq Code Status: Full Family Communication: Pt in room, family not at bedside Disposition Plan: Transfer to inpatient behavioral health when bed ready  Consultants:   Psychiatry  Procedures:     Antimicrobials: Anti-infectives (From admission, onward)   Start     Dose/Rate Route Frequency Ordered Stop   10/23/17 0830  cefTRIAXone (ROCEPHIN) 1 g in sodium chloride 0.9 % 100 mL IVPB  Status:  Discontinued     1 g 200 mL/hr over 30 Minutes Intravenous Daily 10/23/17 0721 10/26/17 1400      Subjective: No complaints  Objective: Vitals:   11/07/17 2212 11/08/17 0421 11/08/17 0922 11/08/17 1632  BP: 122/68 (!) 148/79 (!) 165/95 135/69  Pulse: 90 99 97 80  Resp: 18 16 18 18   Temp: (!) 97.5 F (36.4 C) (!) 97.5 F (36.4 C) 97.6 F (36.4 C)   TempSrc:  Oral Oral Oral   SpO2: 100% 99% 98% 98%  Weight:      Height:        Intake/Output Summary (Last 24 hours) at 11/08/2017 1709 Last data filed at 11/08/2017  1400 Gross per 24 hour  Intake 2816.26 ml  Output 850 ml  Net 1966.26 ml   Filed Weights   10/28/17 0500 11/06/17 0500 11/07/17 0500  Weight: 46.6 kg 46.6 kg 46.6 kg    Examination: General exam: Awake, laying in bed, in nad Respiratory system: Normal respiratory effort, no wheezing  Data Reviewed: I have personally reviewed following labs and imaging studies  CBC: No results for input(s): WBC, NEUTROABS, HGB, HCT, MCV, PLT in the last 168 hours. Basic Metabolic Panel: Recent Labs  Lab 11/03/17 0458 11/08/17 0638  NA 137 143  K 3.2* 3.8  CL 104 109  CO2 28 29  GLUCOSE 137* 136*  BUN 14 13  CREATININE 0.55 0.49  CALCIUM 9.2 9.1  MG 1.9 2.0  PHOS 2.7  --    GFR: Estimated Creatinine Clearance: 50.9 mL/min (by C-G formula based on SCr of 0.49 mg/dL). Liver Function Tests: No results for input(s): AST, ALT, ALKPHOS, BILITOT, PROT, ALBUMIN in the last 168 hours. No results for input(s): LIPASE, AMYLASE in the last 168 hours. No results for input(s): AMMONIA in the last 168 hours. Coagulation Profile: No results for input(s): INR, PROTIME in the last 168 hours. Cardiac Enzymes: No results for input(s): CKTOTAL, CKMB, CKMBINDEX, TROPONINI in the last 168 hours. BNP (last 3 results) No results for input(s): PROBNP in the last 8760 hours. HbA1C: No results for input(s): HGBA1C in the last 72 hours. CBG: Recent Labs  Lab 11/07/17 1556 11/07/17 2210 11/08/17 0728 11/08/17 1141 11/08/17 1630  GLUCAP 134* 117* 121* 130* 107*   Lipid Profile: No results for input(s): CHOL, HDL, LDLCALC, TRIG, CHOLHDL, LDLDIRECT in the last 72 hours. Thyroid Function Tests: No results for input(s): TSH, T4TOTAL, FREET4, T3FREE, THYROIDAB in the last 72 hours. Anemia Panel: No results for input(s): VITAMINB12, FOLATE, FERRITIN, TIBC, IRON, RETICCTPCT in the last 72 hours. Sepsis Labs: No results for input(s): PROCALCITON, LATICACIDVEN in the last 168 hours.  No results found for  this or any previous visit (from the past 240 hour(s)).   Radiology Studies: No results found.  Scheduled Meds: . enoxaparin (LOVENOX) injection  30 mg Subcutaneous Q24H  . fluvoxaMINE  150 mg Per Tube QHS  . free water  30 mL Per Tube Q4H  . lithium citrate  300 mg Per Tube BID  . magnesium hydroxide  30 mL Per Tube Daily  . magnesium oxide  400 mg Per Tube BID  . megestrol  400 mg Per Tube Daily  . mirtazapine  45 mg Per Tube QHS  . OLANZapine zydis  10 mg Sublingual QHS  . phosphorus  500 mg Oral TID WC  . polyethylene glycol  17 g Per Tube BID  . sennosides  5 mL Per Tube QHS  . simvastatin  10 mg Per Tube q1800  . sodium chloride flush  3 mL Intravenous Q12H  . thiamine  100 mg Oral Daily  . vitamin C  250 mg Per Tube BID   Continuous Infusions: . sodium chloride 75 mL/hr at 11/07/17 2228  . feeding supplement (OSMOLITE 1.2 CAL) 1,000 mL (11/08/17 1039)     LOS: 34 days   Rickey Barbara, MD [facility] Pager On Amion  If 7PM-7AM, please contact night-coverage 11/08/2017, 5:09 PM

## 2017-11-09 LAB — GLUCOSE, CAPILLARY
GLUCOSE-CAPILLARY: 102 mg/dL — AB (ref 70–99)
GLUCOSE-CAPILLARY: 156 mg/dL — AB (ref 70–99)
GLUCOSE-CAPILLARY: 104 mg/dL — AB (ref 70–99)
Glucose-Capillary: 122 mg/dL — ABNORMAL HIGH (ref 70–99)

## 2017-11-09 MED ORDER — FREE WATER
300.0000 mL | Freq: Four times a day (QID) | Status: DC
  Administered 2017-11-09 – 2017-12-10 (×112): 300 mL

## 2017-11-09 NOTE — Clinical Social Work Note (Signed)
CSW contacted [facility] (10:55 am) screening unit and confirmed that Ms. Hegwood remains on their wait list. CSW was advised that they call to update their lists on Mondays and Thursdays and CSW will receive a call tomorrow. CSW will continue to follow and facilitate discharge to [facility] when a bed is available.   Genelle Bal, MSW, LCSW Licensed Clinical Social Worker Clinical Social Work Department [employer] [phone]

## 2017-11-09 NOTE — Progress Notes (Signed)
PROGRESS NOTE  Amanda Hall NWG:[id number] DOB: November 19, 1950 DOA: 10/05/2017 PCP: [name], MD  HPI/Recap of past 24 hours: 67 y.o.femalewith a history of depression and anxiety with recent psychiatric hospitalization who presented 9/25 with confusion, agitation, refusal to take medications or per oral intake found to have AKI and acute encephalopathy for which she was admitted. Psychiatry was consulted and guiding medical management. Due to mood-induced anorexia and severe malnutrition, cortrak was placed 10/7 and tube feedings started. Urine culture grew aerococcus for which ceftriaxone was started. The patient continues to have severe psychiatric symptoms, predominantly anxiety, and is awaiting [facility].  11/09/2017: Patient seen and examined at bedside.  No acute events overnight.  Patient is very anxious and minimally interactive.  Appears edematous with third spacing on exam; will discontinue IV fluid.  Assessment/Plan: Principal Problem:   MDD (major depressive disorder), recurrent severe, without psychosis (HCC) Active Problems:   MDD (major depressive disorder)   Anorexia   Dysphagia   Severe malnutrition (HCC)   Anxiety  Severe depression and anxiety Continue psychiatry medications [facility]ing [facility] One-to-one sitter at bedside for patient's own safety On Zyprexa and Remeron at night and lithium twice daily  Severe protein calorie malnutrition due to anorexia Continue core track Dietitian following Continue to monitor electrolytes Continue appetite stimulants  Resolved AKI Hold off IV fluid to avoid fluid overload Add free water flushes 300 cc 4 times daily  Anemia Continue Zocor       Code Status: Full code  Family Communication: None at bedside  Disposition Plan: Discharge to psych hospital when placement is  available   Consultants:  Psychiatry  Procedures:  None  Antimicrobials:  None  DVT prophylaxis: Subcu Lovenox daily   Objective: Vitals:   11/08/17 0922 11/08/17 1632 11/08/17 1947 11/09/17 0600  BP: (!) 165/95 135/69 128/65 (!) 120/91  Pulse: 97 80 95 90  Resp: 18 18 16 16   Temp: 97.6 F (36.4 C)  98.6 F (37 C) 98.4 F (36.9 C)  TempSrc: Oral  Oral Axillary  SpO2: 98% 98% 98% 96%  Weight:      Height:        Intake/Output Summary (Last 24 hours) at 11/09/2017 1249 Last data filed at 11/09/2017 1050 Gross per 24 hour  Intake 2936.71 ml  Output 2950 ml  Net -13.29 ml   Filed Weights   10/28/17 0500 11/06/17 0500 11/07/17 0500  Weight: 46.6 kg 46.6 kg 46.6 kg    Exam:  . General: 67 y.o. year-old female.  Alert and anxious appearing.  Somnolent but easily arousable to voices.   . Cardiovascular: Regular rate and rhythm with no rubs or gallops.  No thyromegaly or JVD noted.   Marland Kitchen Respiratory: Clear to auscultation with no wheezes or rales. Good inspiratory effort. . Abdomen: Soft nontender nondistended with normal bowel sounds x4 quadrants. . Musculoskeletal: Nonpitting edema in upper extremities bilaterally.  2/4 pulses in all 4 extremities. . Skin: No ulcerative lesions noted or rashes . Psychiatry: Mood is anxious  Data Reviewed: CBC: No results for input(s): WBC, NEUTROABS, HGB, HCT, MCV, PLT in the last 168 hours. Basic Metabolic Panel: Recent Labs  Lab 11/03/17 0458 11/08/17 0638  NA 137 143  K 3.2* 3.8  CL 104 109  CO2 28 29  GLUCOSE 137* 136*  BUN 14 13  CREATININE 0.55 0.49  CALCIUM 9.2 9.1  MG 1.9 2.0  PHOS 2.7  --  GFR: Estimated Creatinine Clearance: 50.9 mL/min (by C-G formula based on SCr of 0.49 mg/dL). Liver Function Tests: No results for input(s): AST, ALT, ALKPHOS, BILITOT, PROT, ALBUMIN in the last 168 hours. No results for input(s): LIPASE, AMYLASE in the last 168 hours. No results for input(s): AMMONIA in the last  168 hours. Coagulation Profile: No results for input(s): INR, PROTIME in the last 168 hours. Cardiac Enzymes: No results for input(s): CKTOTAL, CKMB, CKMBINDEX, TROPONINI in the last 168 hours. BNP (last 3 results) No results for input(s): PROBNP in the last 8760 hours. HbA1C: No results for input(s): HGBA1C in the last 72 hours. CBG: Recent Labs  Lab 11/08/17 1141 11/08/17 1630 11/08/17 2140 11/09/17 0738 11/09/17 1108  GLUCAP 130* 107* 125* 122* 102*   Lipid Profile: No results for input(s): CHOL, HDL, LDLCALC, TRIG, CHOLHDL, LDLDIRECT in the last 72 hours. Thyroid Function Tests: No results for input(s): TSH, T4TOTAL, FREET4, T3FREE, THYROIDAB in the last 72 hours. Anemia Panel: No results for input(s): VITAMINB12, FOLATE, FERRITIN, TIBC, IRON, RETICCTPCT in the last 72 hours. Urine analysis:    Component Value Date/Time   COLORURINE AMBER (A) 10/23/2017 0106   APPEARANCEUR CLOUDY (A) 10/23/2017 0106   LABSPEC 1.009 10/23/2017 0106   PHURINE 9.0 (H) 10/23/2017 0106   GLUCOSEU NEGATIVE 10/23/2017 0106   HGBUR MODERATE (A) 10/23/2017 0106   BILIRUBINUR NEGATIVE 10/23/2017 0106   KETONESUR NEGATIVE 10/23/2017 0106   PROTEINUR NEGATIVE 10/23/2017 0106   UROBILINOGEN 0.2 12/15/2006 1654   NITRITE NEGATIVE 10/23/2017 0106   LEUKOCYTESUR MODERATE (A) 10/23/2017 0106   Sepsis Labs: @LABRCNTIP (procalcitonin:4,lacticidven:4)  )No results found for this or any previous visit (from the past 240 hour(s)).    Studies: No results found.  Scheduled Meds: . enoxaparin (LOVENOX) injection  30 mg Subcutaneous Q24H  . fluvoxaMINE  150 mg Per Tube QHS  . free water  30 mL Per Tube Q4H  . lithium citrate  300 mg Per Tube BID  . magnesium hydroxide  30 mL Per Tube Daily  . magnesium oxide  400 mg Per Tube BID  . megestrol  400 mg Per Tube Daily  . mirtazapine  45 mg Per Tube QHS  . OLANZapine zydis  10 mg Sublingual QHS  . phosphorus  500 mg Oral TID WC  . polyethylene glycol   17 g Per Tube BID  . sennosides  5 mL Per Tube QHS  . simvastatin  10 mg Per Tube q1800  . sodium chloride flush  3 mL Intravenous Q12H  . thiamine  100 mg Oral Daily  . vitamin C  250 mg Per Tube BID    Continuous Infusions: . feeding supplement (OSMOLITE 1.2 CAL) 1,000 mL (11/09/17 1610)     LOS: 35 days     Darlin Drop, MD [facility] Pager [phone]  If 7PM-7AM, please contact night-coverage [email or web address] Password [email or web address] 11/09/2017, 12:49 PM

## 2017-11-10 LAB — GLUCOSE, CAPILLARY
GLUCOSE-CAPILLARY: 119 mg/dL — AB (ref 70–99)
Glucose-Capillary: 138 mg/dL — ABNORMAL HIGH (ref 70–99)
Glucose-Capillary: 145 mg/dL — ABNORMAL HIGH (ref 70–99)
Glucose-Capillary: 126 mg/dL — ABNORMAL HIGH (ref 70–99)

## 2017-11-10 NOTE — Consult Note (Addendum)
[facility] Psych Consult Progress Note  11/10/2017 11:59 AM Amanda Hall  MRN:  [id number] Subjective:   [name] was last seen on 10/22 by the psychiatry consult service. She was recommended for inpatient psychiatric hospitalization and no medication adjustments were made since it is unlikely medications will improve her current condition. She appears to be an appropriate candidate for ECT given treatment refractory depression and anxiety.    On interview, [name] reports that her mood is "not so good." She reports that her current anxiety level is a 10/10 where 10 is high and 0 is none. She does not know what makes it better. She reports problems with falling asleep but denies multiple nighttime awakenings. She denies taking daytime naps. She endorses poor appetite. She did request ice cream when nutrition services came to bring her lunch. She denies SI, HI or AVH.   Principal Problem: MDD (major depressive disorder), recurrent severe, without psychosis (HCC) Diagnosis:   Patient Active Problem List   Diagnosis Date Noted  . Anxiety [F41.9] 10/17/2017  . Dysphagia [R13.10] 10/12/2017  . Severe malnutrition (HCC) [E43] 10/12/2017  . MDD (major depressive disorder), recurrent severe, without psychosis (HCC) [F33.2]   . Anorexia [R63.0] 10/05/2017  . Uremia [N19]   . Protein-calorie malnutrition, severe [E43] 08/29/2017  . Generalized anxiety disorder [F41.1] 05/26/2017  . MDD (major depressive disorder) [F32.9] 05/26/2017  . Hypercholesterolemia [E78.00] 03/22/2017  . Nonspecific (abnormal) findings on radiological and other examination of body structure [793] 12/26/2006  . NONSPECIFIC ABNORM FIND RAD&OTH EXAM LUNG FIELD [R93.0] 12/26/2006  . ANEMIA-IRON DEFICIENCY [D50.9] 12/15/2006  . Depression [F32.9] 12/15/2006  . Allergic rhinitis [J30.9] 12/15/2006   Total Time spent with patient: 15 minutes  Past Psychiatric History: MDD and anxiety.   Past Medical History:  Past Medical History:   Diagnosis Date  . Allergy   . Anemia   . Anxiety   . Depression   . Hemorrhoids     Past Surgical History:  Procedure Laterality Date  . arm surgery     Left  . TUBAL LIGATION     Family History:  Family History  Problem Relation Age of Onset  . Stroke Mother   . Hypertension Sister   . Cancer Neg Hx        breat and colon hx  . Diabetes Neg Hx        family   Family Psychiatric  History: Grandfather-depression and committed suicide.  Social History:  Social History   Substance and Sexual Activity  Alcohol Use No     Social History   Substance and Sexual Activity  Drug Use No    Social History   Socioeconomic History  . Marital status: Divorced    Spouse name: Not on file  . Number of children: Not on file  . Years of education: Not on file  . Highest education level: Not on file  Occupational History  . Not on file  Social Needs  . Financial resource strain: Not on file  . Food insecurity:    Worry: Not on file    Inability: Not on file  . Transportation needs:    Medical: Not on file    Non-medical: Not on file  Tobacco Use  . Smoking status: Never Smoker  . Smokeless tobacco: Never Used  Substance and Sexual Activity  . Alcohol use: No  . Drug use: No  . Sexual activity: Not Currently  Lifestyle  . Physical activity:    Days per week:  Not on file    Minutes per session: Not on file  . Stress: Not on file  Relationships  . Social connections:    Talks on phone: Not on file    Gets together: Not on file    Attends religious service: Not on file    Active member of club or organization: Not on file    Attends meetings of clubs or organizations: Not on file    Relationship status: Not on file  Other Topics Concern  . Not on file  Social History Narrative  . Not on file    Sleep: Fair  Appetite:  Poor  Current Medications: Current Facility-Administered Medications  Medication Dose Route Frequency Provider Last Rate Last Dose  .  acetaminophen (TYLENOL) tablet 650 mg  650 mg Oral Q6H PRN Standley Brooking, MD   650 mg at 11/07/17 1833   Or  . acetaminophen (TYLENOL) suppository 650 mg  650 mg Rectal Q6H PRN Standley Brooking, MD      . clonazePAM Scarlette Calico) tablet 0.5 mg  0.5 mg Per Tube TID PRN Tyrone Nine, MD   0.5 mg at 11/09/17 2226  . enoxaparin (LOVENOX) injection 30 mg  30 mg Subcutaneous Q24H Standley Brooking, MD   30 mg at 11/10/17 1037  . feeding supplement (OSMOLITE 1.2 CAL) liquid 1,000 mL  1,000 mL Per Tube Continuous Regalado, Belkys A, MD 55 mL/hr at 11/09/17 2223 1,000 mL at 11/09/17 2223  . fluvoxaMINE (LUVOX) tablet 150 mg  150 mg Per Tube QHS Blount, Andi Devon T, NP   150 mg at 11/09/17 2226  . free water 300 mL  300 mL Per Tube Q6H Hall, Carole N, DO   300 mL at 11/10/17 0616  . labetalol (NORMODYNE,TRANDATE) injection 5 mg  5 mg Intravenous Q2H PRN Jerald Kief, MD   5 mg at 11/04/17 1133  . lithium citrate 300 MG/5ML solution 300 mg  300 mg Per Tube BID Hazeline Junker B, MD   300 mg at 11/10/17 1036  . loperamide (IMODIUM) capsule 2 mg  2 mg Oral PRN Jerald Kief, MD      . magnesium hydroxide (MILK OF MAGNESIA) suspension 30 mL  30 mL Per Tube Daily Blount, Xenia T, NP   30 mL at 11/10/17 1036  . magnesium oxide (MAG-OX) tablet 400 mg  400 mg Per Tube BID Regalado, Belkys A, MD   400 mg at 11/10/17 1037  . megestrol (MEGACE) 400 MG/10ML suspension 400 mg  400 mg Per Tube Daily Blount, Xenia T, NP   400 mg at 11/10/17 1036  . milk and molasses enema  1 enema Rectal Daily PRN Regalado, Belkys A, MD      . mirtazapine (REMERON SOL-TAB) disintegrating tablet 45 mg  45 mg Per Tube QHS Blount, Xenia T, NP   45 mg at 11/09/17 2225  . naphazoline-glycerin (CLEAR EYES REDNESS) ophth solution 2 drop  2 drop Both Eyes QID PRN Standley Brooking, MD      . OLANZapine zydis (ZYPREXA) disintegrating tablet 10 mg  10 mg Sublingual QHS Blount, Xenia T, NP   10 mg at 11/09/17 2226  . ondansetron (ZOFRAN) tablet 4  mg  4 mg Per Tube Q6H PRN Blount, Andi Devon T, NP       Or  . ondansetron (ZOFRAN) injection 4 mg  4 mg Intravenous Q6H PRN Blount, Xenia T, NP      . phosphorus (K PHOS NEUTRAL) tablet 500 mg  500 mg Oral TID WC Tyrone Nine, MD   500 mg at 11/10/17 1037  . polyethylene glycol (MIRALAX / GLYCOLAX) packet 17 g  17 g Per Tube BID Audrea Muscat T, NP   17 g at 11/10/17 1036  . sennosides (SENOKOT) 8.8 MG/5ML syrup 5 mL  5 mL Per Tube QHS Blount, Xenia T, NP   5 mL at 11/09/17 2227  . simvastatin (ZOCOR) tablet 10 mg  10 mg Per Tube q1800 Audrea Muscat T, NP   10 mg at 11/09/17 1813  . sodium chloride flush (NS) 0.9 % injection 3 mL  3 mL Intravenous Q12H Standley Brooking, MD   3 mL at 11/10/17 1038  . thiamine (VITAMIN B-1) tablet 100 mg  100 mg Oral Daily Hazeline Junker B, MD   100 mg at 11/10/17 1037  . vitamin C (ASCORBIC ACID) tablet 250 mg  250 mg Per Tube BID Tyrone Nine, MD   250 mg at 11/10/17 1037    Lab Results:  Results for orders placed or performed during the hospital encounter of 10/05/17 (from the past 48 hour(s))  Glucose, capillary     Status: Abnormal   Collection Time: 11/08/17  4:30 PM  Result Value Ref Range   Glucose-Capillary 107 (H) 70 - 99 mg/dL  Glucose, capillary     Status: Abnormal   Collection Time: 11/08/17  9:40 PM  Result Value Ref Range   Glucose-Capillary 125 (H) 70 - 99 mg/dL  Glucose, capillary     Status: Abnormal   Collection Time: 11/09/17  7:38 AM  Result Value Ref Range   Glucose-Capillary 122 (H) 70 - 99 mg/dL  Glucose, capillary     Status: Abnormal   Collection Time: 11/09/17 11:08 AM  Result Value Ref Range   Glucose-Capillary 102 (H) 70 - 99 mg/dL  Glucose, capillary     Status: Abnormal   Collection Time: 11/09/17  3:54 PM  Result Value Ref Range   Glucose-Capillary 156 (H) 70 - 99 mg/dL  Glucose, capillary     Status: Abnormal   Collection Time: 11/09/17  9:48 PM  Result Value Ref Range   Glucose-Capillary 104 (H) 70 - 99 mg/dL   Glucose, capillary     Status: Abnormal   Collection Time: 11/10/17  7:53 AM  Result Value Ref Range   Glucose-Capillary 138 (H) 70 - 99 mg/dL  Glucose, capillary     Status: Abnormal   Collection Time: 11/10/17 11:22 AM  Result Value Ref Range   Glucose-Capillary 119 (H) 70 - 99 mg/dL    Blood Alcohol level:  Lab Results  Component Value Date   ETH <10 06/12/2017   ETH <10 05/25/2017   Musculoskeletal: Strength & Muscle Tone: Generalized weakness Gait & Station: unable to stand Patient leans: N/A  Psychiatric Specialty Exam: Physical Exam  Nursing note and vitals reviewed. Constitutional: She is oriented to person, place, and time. She appears well-developed.  Thin  HENT:  Head: Normocephalic and atraumatic.  Neck: Normal range of motion.  Respiratory: Effort normal.  Musculoskeletal: Normal range of motion.  Neurological: She is alert and oriented to person, place, and time.  Psychiatric: Her speech is normal and behavior is normal. Judgment and thought content normal. Her mood appears anxious. Cognition and memory are impaired.    Review of Systems  Constitutional: Negative for chills and fever.  Cardiovascular: Negative for chest pain.  Gastrointestinal: Negative for abdominal pain, constipation, diarrhea, nausea and vomiting.  Psychiatric/Behavioral: Positive for  depression. Negative for hallucinations, substance abuse and suicidal ideas. The patient is nervous/anxious and has insomnia.   All other systems reviewed and are negative.   Blood pressure (!) 157/81, pulse (!) 109, temperature 98.9 F (37.2 C), temperature source Oral, resp. rate 18, height 5\' 2"  (1.575 m), weight 46.6 kg, SpO2 95 %.Body mass index is 18.79 kg/m.  General Appearance: Fairly Groomed, elderly, cachetic, Caucasian female, wearing a hospital gown and lying in bed. NAD.   Eye Contact:  Poor  Speech:  Clear and Coherent and Slow  Volume:  Normal  Mood:  Anxious  Affect:  Congruent   Thought Process:  Goal Directed, Linear and Descriptions of Associations: Intact  Orientation:  Full (Time, Place, and Person)  Thought Content:  Logical  Suicidal Thoughts:  No  Homicidal Thoughts:  No  Memory:  Immediate;   Good Recent;   Good Remote;   Good  Judgement:  Poor  Insight:  Shallow  Psychomotor Activity:  Decreased  Concentration:  Concentration: Good and Attention Span: Good  Recall:  Good  Fund of Knowledge:  Good  Language:  Good  Akathisia:  No  Handed:  Right  AIMS (if indicated):   N/A  Assets:  Communication Skills Housing Social Support  ADL's:  Impaired  Cognition:  Impaired due to psychiatric condition.   Sleep:    Fair     Assessment:  Amanda Hall is a 67 y.o. female who was admitted with AKI secondary to poor PO intake. She continues to exhibit signs and symptoms of depression and anxiety including psychomotor retardation and poor appetite with significant weight loss requiring tube feeds. She has only been eating ice cream. She continues to warrant inpatient psychiatric hospitalization for stabilization and treatment. She may benefit from a non medication treatment modality such as ECT since she appears to have refractory depression and anxiety.    Treatment Plan Summary: -Patient warrants inpatient psychiatric hospitalizationfor severe depression and anxiety causing significant impairments in daily functioning with poor PO intake and severe malnutrition. -Continue bedside sitter. -Continue Lithium 300 mg BID for refractory depression/anxiety. May consider discontinuing Lithium since it does not appear to be beneficial for symptoms although it is hard to determine since patient intermittently appears worst than her baseline.  -Continue Zyprexa 10 mg qhs for mood stabilization and anxiety, Luvox 150 mg qhsfor depression and anxietyandRemeron 45 mg qhsfor depression and anxiety. -EKG reviewed and QTc 456 on 10/10. Please closely monitor when  starting or increasing QTc prolonging agents.  -Patient is IVC'd so she may not leave the hospital. -Willfollow patient as needed.    Cherly Beach, DO 11/10/2017, 11:59 AM

## 2017-11-10 NOTE — Progress Notes (Signed)
Occupational Therapy Treatment Patient Details Name: Amanda Hall MRN: [id number] DOB: Dec 27, 1950 Today's Date: 11/10/2017    History of present illness Pt is a 67 y/o female admitted secondary to major depressive disorder. Pt with increased confusion and agitation prior to admission. Found to have AKI and acute encephalopathy. CT negative for acute abnormality. PMH includes anxiety and depression.          Equipment Recommendations  None recommended by OT    Recommendations for Other Services      Precautions / Restrictions Precautions Precautions: Fall Precaution CommentsTEFL teacher, cortrack tube Restrictions Weight Bearing Restrictions: No              ADL either performed or assessed with clinical judgement   ADL Overall ADL's : Needs assistance/impaired Eating/Feeding: Moderate assistance;Bed level Eating/Feeding Details (indicate cue type and reason): holding cup of water.  used both hands. pt very shaky but able to drink from cup with straw with mod A Grooming: Wash/dry face;Moderate assistance Grooming Details (indicate cue type and reason): pt stated I dont like this but did A with ashing face using BUE                               General ADL Comments: limited participation stating - i dont like this during OT session               Cognition Arousal/Alertness: Awake/alert Behavior During Therapy: Anxious Overall Cognitive Status: No family/caregiver present to determine baseline cognitive functioning                                 General Comments: Anxiety limiting session                   Pertinent Vitals/ Pain       Pain Assessment: No/denies pain         Frequency  Min 2X/week        Progress Toward Goals  OT Goals(current goals can now be found in the care plan section)  Progress towards OT goals: Progressing toward goals     Plan Discharge plan remains appropriate    Co-evaluation                 AM-PAC PT "6 Clicks" Daily Activity     Outcome Measure   Help from another person eating meals?: A Lot Help from another person taking care of personal grooming?: A Lot Help from another person toileting, which includes using toliet, bedpan, or urinal?: Total Help from another person bathing (including washing, rinsing, drying)?: Total Help from another person to put on and taking off regular upper body clothing?: Total Help from another person to put on and taking off regular lower body clothing?: Total 6 Click Score: 8    End of Session        Activity Tolerance Other (comment)(limited by anxiety)   Patient Left in bed;with call bell/phone within reach;with nursing/sitter in room   Nurse Communication Mobility status        Time: 4098-1191 OT Time Calculation (min): 14 min  Charges: OT General Charges $OT Visit: 1 Visit OT Treatments $Self Care/Home Management : 8-22 mins  Lise Auer, OT Acute Rehabilitation Services [phone] Office- [phone]      Kam Rahimi, [name] 11/10/2017, 1:54 PM

## 2017-11-10 NOTE — Progress Notes (Signed)
PROGRESS NOTE  Amanda Hall:[id number] DOB: 04-Oct-1950 DOA: 10/05/2017 PCP: [name], MD  HPI/Recap of past 24 hours: 67 y.o.femalewith a history of depression and anxiety with recent psychiatric hospitalization who presented 9/25 with confusion, agitation, refusal to take medications or per oral intake found to have AKI and acute encephalopathy for which she was admitted. Psychiatry was consulted and guiding medical management. Due to mood-induced anorexia and severe malnutrition, cortrak was placed 10/7 and tube feedings started. Urine culture grew aerococcus for which ceftriaxone was started. The patient continues to have severe psychiatric symptoms, predominantly anxiety, and [facility].  11/09/2017: Patient seen and examined at bedside.  No acute events overnight.  Patient is very anxious and minimally interactive.  Appears edematous with third spacing on exam; will discontinue IV fluid.  11/10/17: Somnolent this am. Sitter at bedside. No acute distress. Awaiting bed placement at psych hospital.  Assessment/Plan: Principal Problem:   MDD (major depressive disorder), recurrent severe, without psychosis (HCC) Active Problems:   MDD (major depressive disorder)   Anorexia   Dysphagia   Severe malnutrition (HCC)   Anxiety  Severe depression and anxiety Continue psychiatry medications [facility]ing [facility] One-to-one sitter at bedside for patient's own safety On Zyprexa and Remeron at night and lithium twice daily Continue currents medications  Severe protein calorie malnutrition due to anorexia Continue core track Dietitian following Continue to monitor electrolytes Continue appetite stimulants Hold off IV fluid  Started free water flushes 300 cc q6h, continue  Resolved AKI Hold off IV fluid to avoid fluid overload C/w free water flushes 300 cc 4 times daily  Anemia Continue  Zocor  Physical debility/ambulatory dysfunction Has refused PT Hopefully will agree to do PT at some point Fall precautions   Code Status: Full code  Family Communication: None at bedside  Disposition Plan: Discharge to psych hospital when bed placement is available   Consultants:  Psychiatry  Procedures:  None  Antimicrobials:  None  DVT prophylaxis: Subcu Lovenox daily   Objective: Vitals:   11/08/17 1947 11/09/17 0600 11/09/17 2146 11/10/17 1026  BP: 128/65 (!) 120/91 (!) 110/95 (!) 157/81  Pulse: 95 90 76 (!) 109  Resp: 16 16 18 18   Temp: 98.6 F (37 C) 98.4 F (36.9 C)  98.9 F (37.2 C)  TempSrc: Oral Axillary  Oral  SpO2: 98% 96% 98% 95%  Weight:      Height:        Intake/Output Summary (Last 24 hours) at 11/10/2017 1328 Last data filed at 11/10/2017 0300 Gross per 24 hour  Intake 1595 ml  Output 3100 ml  Net -1505 ml   Filed Weights   10/28/17 0500 11/06/17 0500 11/07/17 0500  Weight: 46.6 kg 46.6 kg 46.6 kg    Exam:  . General: 67 y.o. year-old female.  Somnolent but arousable to voices. Not in distress. . Cardiovascular: RRR no rubs or gallops. No JVD or thyromegaly.   Marland Kitchen Respiratory: Clear to auscultation with no wheezes or rales. Good inspiratory effort. . Abdomen: Soft nontender nondistended with normal bowel sounds x4 quadrants. . Musculoskeletal: Nonpitting edema in upper extremities bilaterally.  2/4 pulses in all 4 extremities. . Skin: No ulcerative lesions noted or rashes . Psychiatry: Unable to assess mood due to somnolence  Data Reviewed: CBC: No results for input(s): WBC, NEUTROABS, HGB, HCT, MCV, PLT in the last 168 hours. Basic Metabolic Panel: Recent Labs  Lab 11/08/17 0638  NA  143  K 3.8  CL 109  CO2 29  GLUCOSE 136*  BUN 13  CREATININE 0.49  CALCIUM 9.1  MG 2.0   GFR: Estimated Creatinine Clearance: 50.9 mL/min (by C-G formula based on SCr of 0.49 mg/dL). Liver Function Tests: No results for input(s):  AST, ALT, ALKPHOS, BILITOT, PROT, ALBUMIN in the last 168 hours. No results for input(s): LIPASE, AMYLASE in the last 168 hours. No results for input(s): AMMONIA in the last 168 hours. Coagulation Profile: No results for input(s): INR, PROTIME in the last 168 hours. Cardiac Enzymes: No results for input(s): CKTOTAL, CKMB, CKMBINDEX, TROPONINI in the last 168 hours. BNP (last 3 results) No results for input(s): PROBNP in the last 8760 hours. HbA1C: No results for input(s): HGBA1C in the last 72 hours. CBG: Recent Labs  Lab 11/09/17 1108 11/09/17 1554 11/09/17 2148 11/10/17 0753 11/10/17 1122  GLUCAP 102* 156* 104* 138* 119*   Lipid Profile: No results for input(s): CHOL, HDL, LDLCALC, TRIG, CHOLHDL, LDLDIRECT in the last 72 hours. Thyroid Function Tests: No results for input(s): TSH, T4TOTAL, FREET4, T3FREE, THYROIDAB in the last 72 hours. Anemia Panel: No results for input(s): VITAMINB12, FOLATE, FERRITIN, TIBC, IRON, RETICCTPCT in the last 72 hours. Urine analysis:    Component Value Date/Time   COLORURINE AMBER (A) 10/23/2017 0106   APPEARANCEUR CLOUDY (A) 10/23/2017 0106   LABSPEC 1.009 10/23/2017 0106   PHURINE 9.0 (H) 10/23/2017 0106   GLUCOSEU NEGATIVE 10/23/2017 0106   HGBUR MODERATE (A) 10/23/2017 0106   BILIRUBINUR NEGATIVE 10/23/2017 0106   KETONESUR NEGATIVE 10/23/2017 0106   PROTEINUR NEGATIVE 10/23/2017 0106   UROBILINOGEN 0.2 12/15/2006 1654   NITRITE NEGATIVE 10/23/2017 0106   LEUKOCYTESUR MODERATE (A) 10/23/2017 0106   Sepsis Labs: @LABRCNTIP (procalcitonin:4,lacticidven:4)  )No results found for this or any previous visit (from the past 240 hour(s)).    Studies: No results found.  Scheduled Meds: . enoxaparin (LOVENOX) injection  30 mg Subcutaneous Q24H  . fluvoxaMINE  150 mg Per Tube QHS  . free water  300 mL Per Tube Q6H  . lithium citrate  300 mg Per Tube BID  . magnesium hydroxide  30 mL Per Tube Daily  . magnesium oxide  400 mg Per Tube  BID  . megestrol  400 mg Per Tube Daily  . mirtazapine  45 mg Per Tube QHS  . OLANZapine zydis  10 mg Sublingual QHS  . phosphorus  500 mg Oral TID WC  . polyethylene glycol  17 g Per Tube BID  . sennosides  5 mL Per Tube QHS  . simvastatin  10 mg Per Tube q1800  . sodium chloride flush  3 mL Intravenous Q12H  . thiamine  100 mg Oral Daily  . vitamin C  250 mg Per Tube BID    Continuous Infusions: . feeding supplement (OSMOLITE 1.2 CAL) 1,000 mL (11/09/17 2223)     LOS: 36 days     Darlin Drop, MD [facility] Pager [phone]  If 7PM-7AM, please contact night-coverage [email or web address] Password [email or web address] 11/10/2017, 1:28 PM

## 2017-11-10 NOTE — Progress Notes (Signed)
Nutrition Follow-up  DOCUMENTATION CODES:   Severe malnutrition in context of social or environmental circumstances, Underweight  INTERVENTION:   Tube Feeding:  Continue Osmolite 1.5 @ 55 ml/hr via Cortrak tube  NUTRITION DIAGNOSIS:   Severe Malnutrition related to social / environmental circumstances(severe major depressive disorder, refractory depression, anxiety) as evidenced by severe fat depletion, severe muscle depletion.  Progressing  GOAL:   Patient will meet greater than or equal to 90% of their needs  Met  MONITOR:   PO intake, Supplement acceptance, Labs, Weight trends  REASON FOR ASSESSMENT:   Malnutrition Screening Tool    ASSESSMENT:   67 yo female admitted with AKI, dehydration, anorexia with acute metabolic encephalopathy, FTT. Pt has been refusing to eat, drink or take medications. Pt has severe protein calorie malnutrition. Pt with recent inpatient psych hospitalization 1 month ago. Pt reports difficulty swallowing although pt's sister believes this to be subjective. Pt with MDD, severe, with psych consult. PMH includes anxiety, depression, malnutrition  Pt continues to await placement  Pt somnolent this AM, anxious when alert, minimally interactive  Tolerating Osmolite 1.2 @ 55 ml/hr via Cortrak tube, free water 300 mL q 6 hours Minimal po intake  No weight loss  +BM 10/30, no N/V  No skin breakdown, pressure injuries noted  Labs: reviewed Meds: reviewed  Diet Order:   Diet Order            DIET - DYS 1 Room service appropriate? Yes; Fluid consistency: Thin  Diet effective now              EDUCATION NEEDS:   Not appropriate for education at this time  Skin:  Skin Assessment: Reviewed RN Assessment  Last BM:  10/30  Height:   Ht Readings from Last 1 Encounters:  10/05/17 _0  (1.575 m)    Weight:   Wt Readings from Last 1 Encounters:  11/07/17 46.6 kg    Ideal Body Weight:  50 kg  BMI:  Body mass index is 18.79  kg/m.  Estimated Nutritional Needs:   Kcal:  1430-1640 kcals   Protein:  72-82 g   Fluid:  >/= 1.4 L   Kerman Passey MS, RD, LDN, CNSC [phone] Pager  [phone] Weekend/On-Call Pager

## 2017-11-11 LAB — GLUCOSE, CAPILLARY
GLUCOSE-CAPILLARY: 105 mg/dL — AB (ref 70–99)
GLUCOSE-CAPILLARY: 109 mg/dL — AB (ref 70–99)
GLUCOSE-CAPILLARY: 95 mg/dL (ref 70–99)
Glucose-Capillary: 121 mg/dL — ABNORMAL HIGH (ref 70–99)

## 2017-11-11 MED ORDER — FERROUS SULFATE 325 (65 FE) MG PO TABS
325.0000 mg | ORAL_TABLET | Freq: Every day | ORAL | Status: DC
  Administered 2017-11-11 – 2017-12-27 (×47): 325 mg via ORAL
  Filled 2017-11-11 (×47): qty 1

## 2017-11-11 NOTE — Care Management Important Message (Signed)
Important Message  Patient Details  Name: Amanda Hall MRN: [id number] Date of Birth: 04-Apr-1950   Medicare Important Message Given:  Yes Patient unable to sign, unsigned copy left at bedside   Orson Aloe 11/11/2017, 5:11 PM

## 2017-11-11 NOTE — Clinical Social Work Note (Signed)
CSW continuing to follow and will contact [facility] regularly to ensure that patient remains on waiti list.  Genelle Bal, MSW, LCSW Licensed Clinical Social Worker Clinical Social Work Department [employer] [phone]

## 2017-11-11 NOTE — Progress Notes (Signed)
PROGRESS NOTE  Amanda Hall ZOX:[id number] DOB: 1950/08/28 DOA: 10/05/2017 PCP: [name], MD  HPI/Recap of past 24 hours: 67 y.o.femalewith a history of depression and anxiety with recent psychiatric hospitalization who presented 9/25 with confusion, agitation, refusal to take medications or per oral intake found to have AKI and acute encephalopathy for which she was admitted. Psychiatry was consulted and guiding medical management. Due to mood-induced anorexia and severe malnutrition, cortrak was placed 10/7 and tube feedings started. Urine culture grew aerococcus for which ceftriaxone was started. The patient continues to have severe psychiatric symptoms, predominantly anxiety, and is [facility].  11/09/2017: Patient seen and examined at bedside.  No acute events overnight.  Patient is very anxious and minimally interactive.  Appears edematous with third spacing on exam; will discontinue IV fluid.  11/10/17: Somnolent this am. Sitter at bedside. No acute distress. Awaiting bed placement at psych hospital.  11/11/2017: Patient seen and examined at her bedside.  Sitter is present in the room.  No acute events overnight.  Patient is more interactive today.  She denies any chest pain dyspnea or palpitations.  Also denies any cough or nausea.  Cortrack is still in place.  Assessment/Plan: Principal Problem:   MDD (major depressive disorder), recurrent severe, without psychosis (HCC) Active Problems:   MDD (major depressive disorder)   Anorexia   Dysphagia   Severe malnutrition (HCC)   Anxiety  Severe depression and anxiety Continue psychiatry medications [facility]waiting [facility] One-to-one sitter at bedside for patient's own safety Continue Zyprexa and Remeron at night and lithium twice daily  Sinus tachycardia No clear etiology Continue to monitor closely Repeat lab studies in the morning  Severe  protein calorie malnutrition due to anorexia Continue core track Dietitian following Continue to monitor electrolytes Continue appetite stimulants Continue free water flushes 300 cc q6h Obtain phosphorus, magnesium, and BMP  Resolved AKI Hold off IV fluid to avoid fluid overload C/w free water flushes 300 cc 4 times daily Repeat BMP in the morning  Iron deficiency anemia  Low saturation ratios on 09/08/2017 Start ferrous sulfate supplement No sign of overt bleeding Repeat CBC in the morning  Physical debility/ambulatory dysfunction Worked with OT no further recommendations   Code Status: Full code  Family Communication: None at bedside  Disposition Plan: Discharge to psych hospital when bed placement is available   Consultants:  Psychiatry  Procedures:  None  Antimicrobials:  None  DVT prophylaxis: Subcu Lovenox daily   Objective: Vitals:   11/10/17 1632 11/10/17 2056 11/11/17 0448 11/11/17 0940  BP: 123/73 119/67 (!) 148/82 131/90  Pulse: 94 84 (!) 106 (!) 105  Resp: 18 18 18 18   Temp: 98.8 F (37.1 C) 98 F (36.7 C)  98.4 F (36.9 C)  TempSrc: Oral   Oral  SpO2: 97% 98% 99% 96%  Weight:      Height:        Intake/Output Summary (Last 24 hours) at 11/11/2017 1417 Last data filed at 11/11/2017 1100 Gross per 24 hour  Intake 2236.67 ml  Output 3551 ml  Net -1314.33 ml   Filed Weights   10/28/17 0500 11/06/17 0500 11/07/17 0500  Weight: 46.6 kg 46.6 kg 46.6 kg    Exam:  . General: 67 y.o. year-old female.  Frail in no acute distress.  Somnolent and minimally interactive.   . Cardiovascular: Regular rate and rhythm with no rubs or gallops.  No JVD or thyromegaly noted.   Marland Kitchen  Respiratory: To auscultation with no wheezes or rales.  Poor inspiratory effort. . Abdomen: Soft nontender nondistended with normal bowel sounds x4 quadrants. . Musculoskeletal: Nonpitting edema in upper extremities bilaterally.  2/4 pulses in all 4 extremities. . Skin: No  ulcerative lesions noted or rashes . Psychiatry: Mood is appropriate for condition and setting.  Data Reviewed: CBC: No results for input(s): WBC, NEUTROABS, HGB, HCT, MCV, PLT in the last 168 hours. Basic Metabolic Panel: Recent Labs  Lab 11/08/17 0638  NA 143  K 3.8  CL 109  CO2 29  GLUCOSE 136*  BUN 13  CREATININE 0.49  CALCIUM 9.1  MG 2.0   GFR: Estimated Creatinine Clearance: 50.9 mL/min (by C-G formula based on SCr of 0.49 mg/dL). Liver Function Tests: No results for input(s): AST, ALT, ALKPHOS, BILITOT, PROT, ALBUMIN in the last 168 hours. No results for input(s): LIPASE, AMYLASE in the last 168 hours. No results for input(s): AMMONIA in the last 168 hours. Coagulation Profile: No results for input(s): INR, PROTIME in the last 168 hours. Cardiac Enzymes: No results for input(s): CKTOTAL, CKMB, CKMBINDEX, TROPONINI in the last 168 hours. BNP (last 3 results) No results for input(s): PROBNP in the last 8760 hours. HbA1C: No results for input(s): HGBA1C in the last 72 hours. CBG: Recent Labs  Lab 11/10/17 1122 11/10/17 1633 11/10/17 2056 11/11/17 0803 11/11/17 1132  GLUCAP 119* 145* 126* 105* 121*   Lipid Profile: No results for input(s): CHOL, HDL, LDLCALC, TRIG, CHOLHDL, LDLDIRECT in the last 72 hours. Thyroid Function Tests: No results for input(s): TSH, T4TOTAL, FREET4, T3FREE, THYROIDAB in the last 72 hours. Anemia Panel: No results for input(s): VITAMINB12, FOLATE, FERRITIN, TIBC, IRON, RETICCTPCT in the last 72 hours. Urine analysis:    Component Value Date/Time   COLORURINE AMBER (A) 10/23/2017 0106   APPEARANCEUR CLOUDY (A) 10/23/2017 0106   LABSPEC 1.009 10/23/2017 0106   PHURINE 9.0 (H) 10/23/2017 0106   GLUCOSEU NEGATIVE 10/23/2017 0106   HGBUR MODERATE (A) 10/23/2017 0106   BILIRUBINUR NEGATIVE 10/23/2017 0106   KETONESUR NEGATIVE 10/23/2017 0106   PROTEINUR NEGATIVE 10/23/2017 0106   UROBILINOGEN 0.2 12/15/2006 1654   NITRITE NEGATIVE  10/23/2017 0106   LEUKOCYTESUR MODERATE (A) 10/23/2017 0106   Sepsis Labs: @LABRCNTIP (procalcitonin:4,lacticidven:4)  )No results found for this or any previous visit (from the past 240 hour(s)).    Studies: No results found.  Scheduled Meds: . enoxaparin (LOVENOX) injection  30 mg Subcutaneous Q24H  . fluvoxaMINE  150 mg Per Tube QHS  . free water  300 mL Per Tube Q6H  . lithium citrate  300 mg Per Tube BID  . magnesium hydroxide  30 mL Per Tube Daily  . magnesium oxide  400 mg Per Tube BID  . megestrol  400 mg Per Tube Daily  . mirtazapine  45 mg Per Tube QHS  . OLANZapine zydis  10 mg Sublingual QHS  . phosphorus  500 mg Oral TID WC  . polyethylene glycol  17 g Per Tube BID  . sennosides  5 mL Per Tube QHS  . simvastatin  10 mg Per Tube q1800  . sodium chloride flush  3 mL Intravenous Q12H  . thiamine  100 mg Oral Daily  . vitamin C  250 mg Per Tube BID    Continuous Infusions: . feeding supplement (OSMOLITE 1.2 CAL) 1,000 mL (11/10/17 2346)     LOS: 37 days     Darlin Drop, MD [facility] Pager [phone]  If 7PM-7AM, please contact  night-coverage [email or web address] Password [email or web address] 11/11/2017, 2:17 PM

## 2017-11-12 LAB — CBC WITH DIFFERENTIAL/PLATELET
Abs Immature Granulocytes: 0.01 10*3/uL (ref 0.00–0.07)
Basophils Absolute: 0 10*3/uL (ref 0.0–0.1)
Basophils Relative: 1 %
EOS ABS: 0 10*3/uL (ref 0.0–0.5)
EOS PCT: 0 %
HEMATOCRIT: 38.6 % (ref 36.0–46.0)
Hemoglobin: 12 g/dL (ref 12.0–15.0)
IMMATURE GRANULOCYTES: 0 %
LYMPHS ABS: 2 10*3/uL (ref 0.7–4.0)
Lymphocytes Relative: 28 %
MCH: 29 pg (ref 26.0–34.0)
MCHC: 31.1 g/dL (ref 30.0–36.0)
MCV: 93.2 fL (ref 80.0–100.0)
MONO ABS: 0.5 10*3/uL (ref 0.1–1.0)
MONOS PCT: 7 %
NEUTROS PCT: 64 %
Neutro Abs: 4.7 10*3/uL (ref 1.7–7.7)
Platelets: 389 10*3/uL (ref 150–400)
RBC: 4.14 MIL/uL (ref 3.87–5.11)
RDW: 14.6 % (ref 11.5–15.5)
WBC: 7.3 10*3/uL (ref 4.0–10.5)
nRBC: 0 % (ref 0.0–0.2)

## 2017-11-12 LAB — COMPREHENSIVE METABOLIC PANEL WITH GFR
ALT: 21 U/L (ref 0–44)
AST: 19 U/L (ref 15–41)
Albumin: 2.7 g/dL — ABNORMAL LOW (ref 3.5–5.0)
Alkaline Phosphatase: 74 U/L (ref 38–126)
Anion gap: 4 — ABNORMAL LOW (ref 5–15)
BUN: 20 mg/dL (ref 8–23)
CO2: 29 mmol/L (ref 22–32)
Calcium: 9.7 mg/dL (ref 8.9–10.3)
Chloride: 107 mmol/L (ref 98–111)
Creatinine, Ser: 0.58 mg/dL (ref 0.44–1.00)
GFR calc Af Amer: >60 mL/min (ref >60)
GFR calc non Af Amer: >60 mL/min (ref >60)
Glucose, Bld: 147 mg/dL — ABNORMAL HIGH (ref 70–99)
Potassium: 3.9 mmol/L (ref 3.5–5.1)
Sodium: 140 mmol/L (ref 135–145)
Total Bilirubin: 0.5 mg/dL (ref 0.3–1.2)
Total Protein: 6 g/dL — ABNORMAL LOW (ref 6.5–8.1)

## 2017-11-12 LAB — GLUCOSE, CAPILLARY
GLUCOSE-CAPILLARY: 122 mg/dL — AB (ref 70–99)
Glucose-Capillary: 133 mg/dL — ABNORMAL HIGH (ref 70–99)
Glucose-Capillary: 102 mg/dL — ABNORMAL HIGH (ref 70–99)
Glucose-Capillary: 121 mg/dL — ABNORMAL HIGH (ref 70–99)

## 2017-11-12 LAB — MAGNESIUM: Magnesium: 2.4 mg/dL (ref 1.7–2.4)

## 2017-11-12 LAB — PHOSPHORUS: Phosphorus: 3.3 mg/dL (ref 2.5–4.6)

## 2017-11-12 NOTE — Progress Notes (Signed)
PROGRESS NOTE  Amanda Hall ZOX:[id number] DOB: 21-Mar-1950 DOA: 10/05/2017 PCP: [name], MD  HPI/Recap of past 24 hours: 67 y.o.femalewith a history of depression and anxiety with recent psychiatric hospitalization who presented 9/25 with confusion, agitation, refusal to take medications or per oral intake found to have AKI and acute encephalopathy for which she was admitted. Psychiatry was consulted and guiding medical management. Due to mood-induced anorexia and severe malnutrition, cortrak was placed 10/7 and tube feedings started. Urine culture grew aerococcus for which ceftriaxone was started. The patient continues to have severe psychiatric symptoms, predominantly anxiety, and is awaiting [facility].  11/09/2017: Patient seen and examined at bedside.  No acute events overnight.  Patient is very anxious and minimally interactive.  Appears edematous with third spacing on exam; will discontinue IV fluid.  11/10/17: Somnolent this am. Sitter at bedside. No acute distress. Awaiting bed placement at psych hospital.  11/11/2017: Patient seen and examined at her bedside.  Sitter is present in the room.  No acute events overnight.  Patient is more interactive today.  She denies any chest pain dyspnea or palpitations.  Also denies any cough or nausea.  Cortrack is still in place.  11/12/2017: Patient seen and examined at bedside with sitter present.  Anxious and minimally interactive.  She has no new complaints.  No acute events overnight.  Lab studies and physical exam essentially unremarkable.  Assessment/Plan: Principal Problem:   MDD (major depressive disorder), recurrent severe, without psychosis (HCC) Active Problems:   MDD (major depressive disorder)   Anorexia   Dysphagia   Severe malnutrition (HCC)   Anxiety  Severe depression and anxiety Continue psychiatry medications [facility]waiting [facility] One-to-one  sitter at bedside for patient's own safety Continue Zyprexa and Remeron at night and lithium twice daily  Sinus tachycardia No clear etiology Continue to monitor closely Repeat lab studies in the morning  Severe protein calorie malnutrition due to anorexia Continue core track Dietitian following Continue to monitor electrolytes Continue appetite stimulants Continue free water flushes 300 cc q6h Unremarkable studies of phosphorus, magnesium, and BMP  Resolved AKI Hold off IV fluid to avoid fluid overload C/w free water flushes 300 cc 4 times daily  Iron deficiency anemia  Low saturation ratios on 09/08/2017 Continue ferrous sulfate supplement No sign of overt bleeding  Physical debility/ambulatory dysfunction Worked with OT no further recommendations   Code Status: Full code  Family Communication: None at bedside  Disposition Plan: Discharge to psych hospital when bed placement is available   Consultants:  Psychiatry  Procedures:  None  Antimicrobials:  None  DVT prophylaxis: Subcu Lovenox daily   Objective: Vitals:   11/11/17 1703 11/11/17 2007 11/12/17 0418 11/12/17 0755  BP: 140/75 130/66 136/73 135/69  Pulse: 93 88 82 89  Resp: 18 18 20 18   Temp: 98.1 F (36.7 C) 98 F (36.7 C) 98 F (36.7 C)   TempSrc: Oral Oral    SpO2: 98% 98% 98% 99%  Weight:      Height:        Intake/Output Summary (Last 24 hours) at 11/12/2017 1443 Last data filed at 11/12/2017 1256 Gross per 24 hour  Intake 1323 ml  Output 1700 ml  Net -377 ml   Filed Weights   10/28/17 0500 11/06/17 0500 11/07/17 0500  Weight: 46.6 kg 46.6 kg 46.6 kg    Exam:  . General: 67 y.o. year-old female.  Frail in no acute distress.  Somnolent and minimally interactive . Cardiovascular: Regular rate and rhythm with no rubs or gallops.  No JVD or thyromegaly noted. Marland Kitchen Respiratory: Clear to auscultation with no wheezes or rales.  Poor respiratory effort.  Cord track in place  nasally . Abdomen: Soft nontender nondistended with normal bowel sounds x4 quadrants. . Musculoskeletal: Nonpitting edema in upper extremities bilaterally.  2/4 pulses in all 4 extremities. Marland Kitchen Psychiatry: Mood is appropriate for condition and setting.  Data Reviewed: CBC: Recent Labs  Lab 11/12/17 0712  WBC 7.3  NEUTROABS 4.7  HGB 12.0  HCT 38.6  MCV 93.2  PLT 389   Basic Metabolic Panel: Recent Labs  Lab 11/08/17 0638 11/12/17 0712  NA 143 140  K 3.8 3.9  CL 109 107  CO2 29 29  GLUCOSE 136* 147*  BUN 13 20  CREATININE 0.49 0.58  CALCIUM 9.1 9.7  MG 2.0 2.4  PHOS  --  3.3   GFR: Estimated Creatinine Clearance: 50.9 mL/min (by C-G formula based on SCr of 0.58 mg/dL). Liver Function Tests: Recent Labs  Lab 11/12/17 0712  AST 19  ALT 21  ALKPHOS 74  BILITOT 0.5  PROT 6.0*  ALBUMIN 2.7*   No results for input(s): LIPASE, AMYLASE in the last 168 hours. No results for input(s): AMMONIA in the last 168 hours. Coagulation Profile: No results for input(s): INR, PROTIME in the last 168 hours. Cardiac Enzymes: No results for input(s): CKTOTAL, CKMB, CKMBINDEX, TROPONINI in the last 168 hours. BNP (last 3 results) No results for input(s): PROBNP in the last 8760 hours. HbA1C: No results for input(s): HGBA1C in the last 72 hours. CBG: Recent Labs  Lab 11/11/17 1132 11/11/17 1659 11/11/17 2203 11/12/17 0749 11/12/17 1145  GLUCAP 121* 109* 95 133* 102*   Lipid Profile: No results for input(s): CHOL, HDL, LDLCALC, TRIG, CHOLHDL, LDLDIRECT in the last 72 hours. Thyroid Function Tests: No results for input(s): TSH, T4TOTAL, FREET4, T3FREE, THYROIDAB in the last 72 hours. Anemia Panel: No results for input(s): VITAMINB12, FOLATE, FERRITIN, TIBC, IRON, RETICCTPCT in the last 72 hours. Urine analysis:    Component Value Date/Time   COLORURINE AMBER (A) 10/23/2017 0106   APPEARANCEUR CLOUDY (A) 10/23/2017 0106   LABSPEC 1.009 10/23/2017 0106   PHURINE 9.0 (H)  10/23/2017 0106   GLUCOSEU NEGATIVE 10/23/2017 0106   HGBUR MODERATE (A) 10/23/2017 0106   BILIRUBINUR NEGATIVE 10/23/2017 0106   KETONESUR NEGATIVE 10/23/2017 0106   PROTEINUR NEGATIVE 10/23/2017 0106   UROBILINOGEN 0.2 12/15/2006 1654   NITRITE NEGATIVE 10/23/2017 0106   LEUKOCYTESUR MODERATE (A) 10/23/2017 0106   Sepsis Labs: @LABRCNTIP (procalcitonin:4,lacticidven:4)  )No results found for this or any previous visit (from the past 240 hour(s)).    Studies: No results found.  Scheduled Meds: . enoxaparin (LOVENOX) injection  30 mg Subcutaneous Q24H  . ferrous sulfate  325 mg Oral Q breakfast  . fluvoxaMINE  150 mg Per Tube QHS  . free water  300 mL Per Tube Q6H  . lithium citrate  300 mg Per Tube BID  . magnesium hydroxide  30 mL Per Tube Daily  . magnesium oxide  400 mg Per Tube BID  . megestrol  400 mg Per Tube Daily  . mirtazapine  45 mg Per Tube QHS  . OLANZapine zydis  10 mg Sublingual QHS  . phosphorus  500 mg Oral TID WC  . polyethylene glycol  17 g Per Tube BID  . sennosides  5 mL Per Tube QHS  . simvastatin  10 mg  Per Tube q1800  . sodium chloride flush  3 mL Intravenous Q12H  . thiamine  100 mg Oral Daily  . vitamin C  250 mg Per Tube BID    Continuous Infusions: . feeding supplement (OSMOLITE 1.2 CAL) 1,000 mL (11/11/17 2338)     LOS: 38 days     Darlin Drop, MD [facility] Pager [phone]  If 7PM-7AM, please contact night-coverage [email or web address] Password [facility] 11/12/2017, 2:43 PM

## 2017-11-12 NOTE — Progress Notes (Signed)
Patients Coretrak tube clogged. Coretrak team notified. Coretrak now unclogged and functioning properly. Will continue to monitor.

## 2017-11-13 LAB — GLUCOSE, CAPILLARY
GLUCOSE-CAPILLARY: 119 mg/dL — AB (ref 70–99)
Glucose-Capillary: 109 mg/dL — ABNORMAL HIGH (ref 70–99)
Glucose-Capillary: 115 mg/dL — ABNORMAL HIGH (ref 70–99)
Glucose-Capillary: 112 mg/dL — ABNORMAL HIGH (ref 70–99)

## 2017-11-13 NOTE — Progress Notes (Signed)
PROGRESS NOTE  Amanda Hall ZOX:[id number] DOB: 05-Jan-1951 DOA: 10/05/2017 PCP: [name], MD  HPI/Recap of past 24 hours: 67 y.o.femalewith a history of depression and anxiety with recent psychiatric hospitalization who presented 9/25 with confusion, agitation, refusal to take medications or per oral intake found to have AKI and acute encephalopathy for which she was admitted. Psychiatry was consulted and guiding medical management. Due to mood-induced anorexia and severe malnutrition, cortrak was placed 10/7 and tube feedings started. Urine culture grew aerococcus for which ceftriaxone was started. The patient continues to have severe psychiatric symptoms, predominantly anxiety, and is [facility].  11/09/2017: Patient seen and examined at bedside.  No acute events overnight.  Patient is very anxious and minimally interactive.  Appears edematous with third spacing on exam; will discontinue IV fluid.  11/10/17: Somnolent this am. Sitter at bedside. No acute distress. Awaiting bed placement at psych hospital.  11/11/2017: Patient seen and examined at her bedside.  Sitter is present in the room.  No acute events overnight.  Patient is more interactive today.  She denies any chest pain dyspnea or palpitations.  Also denies any cough or nausea.  Cortrack is still in place.  11/12/2017: Patient seen and examined at bedside with sitter present.  Anxious and minimally interactive.  She has no new complaints.  No acute events overnight.  Lab studies and physical exam essentially unremarkable.  07/13/2017: Patient seen and examined with sitter at bedside.  No new complaints.  No acute events overnight.  Awaiting placement at psych hospital.  Assessment/Plan: Principal Problem:   MDD (major depressive disorder), recurrent severe, without psychosis (HCC) Active Problems:   MDD (major depressive disorder)   Anorexia   Dysphagia   Severe malnutrition  (HCC)   Anxiety  Severe depression and anxiety Continue psychiatry medications [facility]waiting [facility] One-to-one sitter at bedside for patient's own safety Continue Zyprexa and Remeron at night and lithium twice daily  Sinus tachycardia No clear etiology Continue to monitor closely Repeat lab studies in the morning  Severe protein calorie malnutrition due to anorexia Continue core track Dietitian following Continue to monitor electrolytes Continue appetite stimulants Continue free water flushes 300 cc q6h Unremarkable studies of phosphorus, magnesium, and BMP  Resolved AKI Hold off IV fluid to avoid fluid overload C/w free water flushes 300 cc 4 times daily  Iron deficiency anemia  Low saturation ratios on 09/08/2017 Continue ferrous sulfate supplement No sign of overt bleeding  Physical debility/ambulatory dysfunction Worked with OT no further recommendations   Code Status: Full code  Family Communication: None at bedside  Disposition Plan: Discharge to psych hospital when bed placement is available   Consultants:  Psychiatry  Procedures:  None  Antimicrobials:  None  DVT prophylaxis: Subcu Lovenox daily   Objective: Vitals:   11/12/17 1743 11/12/17 2136 11/13/17 0556 11/13/17 0801  BP: 139/73 125/71 (!) 132/116 (!) 144/78  Pulse: 98 88 (!) 102 (!) 105  Resp: 18 18 20 20   Temp: 98.8 F (37.1 C) 98.1 F (36.7 C) 97.9 F (36.6 C) 98.4 F (36.9 C)  TempSrc: Axillary Oral Oral Axillary  SpO2: 98% 97% 99%   Weight:      Height:        Intake/Output Summary (Last 24 hours) at 11/13/2017 1624 Last data filed at 11/13/2017 1314 Gross per 24 hour  Intake 2935 ml  Output 200 ml  Net 2735 ml   [employer]  10/28/17 0500 11/06/17 0500 11/07/17 0500  Weight: 46.6 kg 46.6 kg 46.6 kg    Exam:  . General: 67 y.o. year-old female.  Frail and cachectic.  No acute distress.  Interactive minimally.    . Cardiovascular: Regular rate and rhythm with no rubs or gallops.  No JVD or thyromegaly noted.   Marland Kitchen Respiratory: Clear to auscultation with no wheezes or rales.  Poor respiratory effort. . Abdomen: Soft nontender nondistended with normal bowel sounds x4 quadrants. . Musculoskeletal: Nonpitting edema in upper extremities bilaterally.  2/4 pulses in all 4 extremities. Marland Kitchen Psychiatry: Mood is appropriate for condition and setting.  Data Reviewed: CBC: Recent Labs  Lab 11/12/17 0712  WBC 7.3  NEUTROABS 4.7  HGB 12.0  HCT 38.6  MCV 93.2  PLT 389   Basic Metabolic Panel: Recent Labs  Lab 11/08/17 0638 11/12/17 0712  NA 143 140  K 3.8 3.9  CL 109 107  CO2 29 29  GLUCOSE 136* 147*  BUN 13 20  CREATININE 0.49 0.58  CALCIUM 9.1 9.7  MG 2.0 2.4  PHOS  --  3.3   GFR: Estimated Creatinine Clearance: 50.9 mL/min (by C-G formula based on SCr of 0.58 mg/dL). Liver Function Tests: Recent Labs  Lab 11/12/17 0712  AST 19  ALT 21  ALKPHOS 74  BILITOT 0.5  PROT 6.0*  ALBUMIN 2.7*   No results for input(s): LIPASE, AMYLASE in the last 168 hours. No results for input(s): AMMONIA in the last 168 hours. Coagulation Profile: No results for input(s): INR, PROTIME in the last 168 hours. Cardiac Enzymes: No results for input(s): CKTOTAL, CKMB, CKMBINDEX, TROPONINI in the last 168 hours. BNP (last 3 results) No results for input(s): PROBNP in the last 8760 hours. HbA1C: No results for input(s): HGBA1C in the last 72 hours. CBG: Recent Labs  Lab 11/12/17 1145 11/12/17 1612 11/12/17 2138 11/13/17 0747 11/13/17 1111  GLUCAP 102* 121* 122* 119* 109*   Lipid Profile: No results for input(s): CHOL, HDL, LDLCALC, TRIG, CHOLHDL, LDLDIRECT in the last 72 hours. Thyroid Function Tests: No results for input(s): TSH, T4TOTAL, FREET4, T3FREE, THYROIDAB in the last 72 hours. Anemia Panel: No results for input(s): VITAMINB12, FOLATE, FERRITIN, TIBC, IRON, RETICCTPCT in the last 72  hours. Urine analysis:    Component Value Date/Time   COLORURINE AMBER (A) 10/23/2017 0106   APPEARANCEUR CLOUDY (A) 10/23/2017 0106   LABSPEC 1.009 10/23/2017 0106   PHURINE 9.0 (H) 10/23/2017 0106   GLUCOSEU NEGATIVE 10/23/2017 0106   HGBUR MODERATE (A) 10/23/2017 0106   BILIRUBINUR NEGATIVE 10/23/2017 0106   KETONESUR NEGATIVE 10/23/2017 0106   PROTEINUR NEGATIVE 10/23/2017 0106   UROBILINOGEN 0.2 12/15/2006 1654   NITRITE NEGATIVE 10/23/2017 0106   LEUKOCYTESUR MODERATE (A) 10/23/2017 0106   Sepsis Labs: @LABRCNTIP (procalcitonin:4,lacticidven:4)  )No results found for this or any previous visit (from the past 240 hour(s)).    Studies: No results found.  Scheduled Meds: . enoxaparin (LOVENOX) injection  30 mg Subcutaneous Q24H  . ferrous sulfate  325 mg Oral Q breakfast  . fluvoxaMINE  150 mg Per Tube QHS  . free water  300 mL Per Tube Q6H  . lithium citrate  300 mg Per Tube BID  . magnesium hydroxide  30 mL Per Tube Daily  . magnesium oxide  400 mg Per Tube BID  . megestrol  400 mg Per Tube Daily  . mirtazapine  45 mg Per Tube QHS  . OLANZapine zydis  10 mg Sublingual QHS  . phosphorus  500 mg Oral TID WC  . polyethylene glycol  17 g Per Tube BID  . sennosides  5 mL Per Tube QHS  . simvastatin  10 mg Per Tube q1800  . sodium chloride flush  3 mL Intravenous Q12H  . thiamine  100 mg Oral Daily  . vitamin C  250 mg Per Tube BID    Continuous Infusions: . feeding supplement (OSMOLITE 1.2 CAL) 1,000 mL (11/12/17 2245)     LOS: 39 days     Darlin Drop, MD [facility] Pager [phone]  If 7PM-7AM, please contact night-coverage [email or web address] Password [facility] 11/13/2017, 4:24 PM

## 2017-11-13 NOTE — Plan of Care (Signed)
  Problem: Activity: Goal: Risk for activity intolerance will decrease Outcome: Not Progressing   Problem: Coping: Goal: Level of anxiety will decrease Outcome: Not Progressing   

## 2017-11-14 LAB — GLUCOSE, CAPILLARY
GLUCOSE-CAPILLARY: 123 mg/dL — AB (ref 70–99)
Glucose-Capillary: 121 mg/dL — ABNORMAL HIGH (ref 70–99)
Glucose-Capillary: 103 mg/dL — ABNORMAL HIGH (ref 70–99)

## 2017-11-14 MED ORDER — CHLORHEXIDINE GLUCONATE 0.12 % MT SOLN
15.0000 mL | Freq: Two times a day (BID) | OROMUCOSAL | Status: DC
  Administered 2017-11-14 – 2018-01-02 (×89): 15 mL via OROMUCOSAL
  Filled 2017-11-14 (×92): qty 15

## 2017-11-14 MED ORDER — ORAL CARE MOUTH RINSE
15.0000 mL | Freq: Two times a day (BID) | OROMUCOSAL | Status: DC
  Administered 2017-11-16 – 2017-12-31 (×45): 15 mL via OROMUCOSAL

## 2017-11-14 NOTE — Plan of Care (Signed)
  Problem: Coping: Goal: Level of anxiety will decrease Outcome: Progressing   Problem: Activity: Goal: Risk for activity intolerance will decrease Outcome: Progressing   

## 2017-11-14 NOTE — Progress Notes (Signed)
PROGRESS NOTE  Rheana Casebolt ZOX:[id number] DOB: May 11, 1950 DOA: 10/05/2017 PCP: [name], MD  HPI/Recap of past 24 hours: 67 y.o.femalewith a history of depression and anxiety with recent psychiatric hospitalization who presented 9/25 with confusion, agitation, refusal to take medications or per oral intake found to have AKI and acute encephalopathy for which she was admitted. Psychiatry was consulted and guiding medical management. Due to mood-induced anorexia and severe malnutrition, cortrak was placed 10/7 and tube feedings started. Urine culture grew aerococcus for which ceftriaxone was started. The patient continues to have severe psychiatric symptoms, predominantly anxiety, and [facility].  11/09/2017: Patient seen and examined at bedside.  No acute events overnight.  Patient is very anxious and minimally interactive.  Appears edematous with third spacing on exam; will discontinue IV fluid.  11/10/17: Somnolent this am. Sitter at bedside. No acute distress. Awaiting bed placement at psych hospital.  11/11/2017: Patient seen and examined at her bedside.  Sitter is present in the room.  No acute events overnight.  Patient is more interactive today.  She denies any chest pain dyspnea or palpitations.  Also denies any cough or nausea.  Cortrack is still in place.  11/12/2017: Patient seen and examined at bedside with sitter present.  Anxious and minimally interactive.  She has no new complaints.  No acute events overnight.  Lab studies and physical exam essentially unremarkable.  11/13/2017: Patient seen and examined with sitter at bedside.  No new complaints.  No acute events overnight.  Awaiting placement at psych hospital.  11/14/17: No acute issues. Waiting for placement.  Assessment/Plan: Principal Problem:   MDD (major depressive disorder), recurrent severe, without psychosis (HCC) Active Problems:   MDD (major depressive  disorder)   Anorexia   Dysphagia   Severe malnutrition (HCC)   Anxiety  Severe depression and anxiety Continue psychiatry medications [facility]waiting [facility] One-to-one sitter at bedside for patient's own safety Continue Zyprexa and Remeron at night and lithium twice daily  Sinus tachycardia No clear etiology Continue to monitor closely Repeat lab studies in the morning  Severe protein calorie malnutrition due to anorexia Continue core track Dietitian following Continue to monitor electrolytes Continue appetite stimulants Continue free water flushes 300 cc q6h Unremarkable studies of phosphorus, magnesium, and BMP  Resolved AKI Hold off IV fluid to avoid fluid overload C/w free water flushes 300 cc 4 times daily  Iron deficiency anemia  Low saturation ratios on 09/08/2017 Continue ferrous sulfate supplement No sign of overt bleeding  Physical debility/ambulatory dysfunction Worked with OT no further recommendations   Code Status: Full code  Family Communication: None at bedside  Disposition Plan: Discharge to psych hospital when bed placement is available   Consultants:  Psychiatry  Procedures:  None  Antimicrobials:  None  DVT prophylaxis: Subcu Lovenox daily   Objective: Vitals:   11/13/17 2035 11/14/17 0111 11/14/17 0619 11/14/17 0900  BP: (!) 171/82 115/61 (!) 105/55 (!) 131/108  Pulse: (!) 113 82 87 (!) 184  Resp:  15 15 18   Temp: 100.1 F (37.8 C) 97.9 F (36.6 C) 97.8 F (36.6 C) 97.9 F (36.6 C)  TempSrc: Oral Axillary Axillary Oral  SpO2: (!) 88% 98% 99% 100%  Weight:      Height:        Intake/Output Summary (Last 24 hours) at 11/14/2017 1211 Last data filed at 11/14/2017 1100 Gross per 24 hour  Intake 2143 ml  Output 1700 ml  Net 443 ml   Filed Weights   10/28/17 0500 11/06/17 0500 11/07/17 0500  Weight: 46.6 kg 46.6 kg 46.6 kg    Exam:  . General: 67 y.o. year-old female. Frail NAD A and  interactive. . Cardiovascular:RRR no rubs or gallops. No JVD.   Marland Kitchen Respiratory: CTA no wheezing or rales . Abdomen: Soft nontender nondistended with normal bowel sounds x4 quadrants. . Musculoskeletal: Nonpitting edema in upper extremities bilaterally.  2/4 pulses in all 4 extremities. Marland Kitchen Psychiatry: Mood is appropriate for condition and setting.  Data Reviewed: CBC: Recent Labs  Lab 11/12/17 0712  WBC 7.3  NEUTROABS 4.7  HGB 12.0  HCT 38.6  MCV 93.2  PLT 389   Basic Metabolic Panel: Recent Labs  Lab 11/08/17 0638 11/12/17 0712  NA 143 140  K 3.8 3.9  CL 109 107  CO2 29 29  GLUCOSE 136* 147*  BUN 13 20  CREATININE 0.49 0.58  CALCIUM 9.1 9.7  MG 2.0 2.4  PHOS  --  3.3   GFR: Estimated Creatinine Clearance: 50.9 mL/min (by C-G formula based on SCr of 0.58 mg/dL). Liver Function Tests: Recent Labs  Lab 11/12/17 0712  AST 19  ALT 21  ALKPHOS 74  BILITOT 0.5  PROT 6.0*  ALBUMIN 2.7*   No results for input(s): LIPASE, AMYLASE in the last 168 hours. No results for input(s): AMMONIA in the last 168 hours. Coagulation Profile: No results for input(s): INR, PROTIME in the last 168 hours. Cardiac Enzymes: No results for input(s): CKTOTAL, CKMB, CKMBINDEX, TROPONINI in the last 168 hours. BNP (last 3 results) No results for input(s): PROBNP in the last 8760 hours. HbA1C: No results for input(s): HGBA1C in the last 72 hours. CBG: Recent Labs  Lab 11/13/17 1111 11/13/17 1637 11/13/17 2122 11/14/17 0752 11/14/17 1118  GLUCAP 109* 115* 112* 123* 121*   Lipid Profile: No results for input(s): CHOL, HDL, LDLCALC, TRIG, CHOLHDL, LDLDIRECT in the last 72 hours. Thyroid Function Tests: No results for input(s): TSH, T4TOTAL, FREET4, T3FREE, THYROIDAB in the last 72 hours. Anemia Panel: No results for input(s): VITAMINB12, FOLATE, FERRITIN, TIBC, IRON, RETICCTPCT in the last 72 hours. Urine analysis:    Component Value Date/Time   COLORURINE AMBER (A) 10/23/2017  0106   APPEARANCEUR CLOUDY (A) 10/23/2017 0106   LABSPEC 1.009 10/23/2017 0106   PHURINE 9.0 (H) 10/23/2017 0106   GLUCOSEU NEGATIVE 10/23/2017 0106   HGBUR MODERATE (A) 10/23/2017 0106   BILIRUBINUR NEGATIVE 10/23/2017 0106   KETONESUR NEGATIVE 10/23/2017 0106   PROTEINUR NEGATIVE 10/23/2017 0106   UROBILINOGEN 0.2 12/15/2006 1654   NITRITE NEGATIVE 10/23/2017 0106   LEUKOCYTESUR MODERATE (A) 10/23/2017 0106   Sepsis Labs: @LABRCNTIP (procalcitonin:4,lacticidven:4)  )No results found for this or any previous visit (from the past 240 hour(s)).    Studies: No results found.  Scheduled Meds: . enoxaparin (LOVENOX) injection  30 mg Subcutaneous Q24H  . ferrous sulfate  325 mg Oral Q breakfast  . fluvoxaMINE  150 mg Per Tube QHS  . free water  300 mL Per Tube Q6H  . lithium citrate  300 mg Per Tube BID  . magnesium hydroxide  30 mL Per Tube Daily  . magnesium oxide  400 mg Per Tube BID  . megestrol  400 mg Per Tube Daily  . mirtazapine  45 mg Per Tube QHS  . OLANZapine zydis  10 mg Sublingual QHS  . phosphorus  500 mg Oral TID WC  . polyethylene glycol  17 g Per Tube BID  .  sennosides  5 mL Per Tube QHS  . simvastatin  10 mg Per Tube q1800  . sodium chloride flush  3 mL Intravenous Q12H  . thiamine  100 mg Oral Daily  . vitamin C  250 mg Per Tube BID    Continuous Infusions: . feeding supplement (OSMOLITE 1.2 CAL) 1,000 mL (11/12/17 2245)     LOS: 40 days     Darlin Drop, MD [facility] Pager [phone]  If 7PM-7AM, please contact night-coverage [email or web address] Password [email or web address] 11/14/2017, 12:11 PM

## 2017-11-14 NOTE — Clinical Social Work Note (Addendum)
Patient continues to need inpatient psychiatric placement and IVC paperwork updated today, and Findings & Custody Order for Involuntary Commitment received by fax from [facility] [place]. IVC paperwork placed in patient's chart. IVC paperwork will need to be updated again on 11/21/17. Call made to [facility] [phone]) and confirmed that patient remains on their wait list.   CSW will continue to follow and maintain contact with [facility] to ensure patient remains on wait list.  Genelle Bal, MSW, LCSW Licensed Clinical Social Worker Clinical Social Work Department [employer] [phone]

## 2017-11-15 ENCOUNTER — Inpatient Hospital Stay (HOSPITAL_COMMUNITY): Payer: Medicare Other

## 2017-11-15 DIAGNOSIS — I351 Nonrheumatic aortic (valve) insufficiency: Secondary | ICD-10-CM

## 2017-11-15 LAB — ECHOCARDIOGRAM COMPLETE
Height: 62 in
Weight: 1643.75 oz

## 2017-11-15 LAB — GLUCOSE, CAPILLARY
Glucose-Capillary: 103 mg/dL — ABNORMAL HIGH (ref 70–99)
Glucose-Capillary: 114 mg/dL — ABNORMAL HIGH (ref 70–99)
Glucose-Capillary: 111 mg/dL — ABNORMAL HIGH (ref 70–99)
Glucose-Capillary: 99 mg/dL (ref 70–99)

## 2017-11-15 NOTE — Progress Notes (Signed)
PROGRESS NOTE  Amanda Hall ZOX:[id number] DOB: 1950/10/29 DOA: 10/05/2017 PCP: [name], MD  HPI/Recap of past 24 hours: 67 y.o.femalewith a history of depression and anxiety with recent psychiatric hospitalization who presented 9/25 with confusion, agitation, refusal to take medications or per oral intake found to have AKI and acute encephalopathy for which she was admitted. Psychiatry was consulted and guiding medical management. Due to mood-induced anorexia and severe malnutrition, cortrak was placed 10/7 and tube feedings started. Urine culture grew aerococcus for which ceftriaxone was started. The patient continues to have severe psychiatric symptoms, predominantly anxiety, and is [facility].  11/09/2017: Patient seen and examined at bedside.  No acute events overnight.  Patient is very anxious and minimally interactive.  Appears edematous with third spacing on exam; will discontinue IV fluid.  11/10/17: Somnolent this am. Sitter at bedside. No acute distress. Awaiting bed placement at psych hospital.  11/11/2017: Patient seen and examined at her bedside.  Sitter is present in the room.  No acute events overnight.  Patient is more interactive today.  She denies any chest pain dyspnea or palpitations.  Also denies any cough or nausea.  Cortrack is still in place.  11/12/2017: Patient seen and examined at bedside with sitter present.  Anxious and minimally interactive.  She has no new complaints.  No acute events overnight.  Lab studies and physical exam essentially unremarkable.  11/13/2017: Patient seen and examined with sitter at bedside.  No new complaints.  No acute events overnight.  Awaiting placement at psych hospital.  11/14/17: No acute issues. Waiting for placement.  11/15/17: no acute events overnight. Abnormal ekg. 2D echo obtained and revealed suspected cardiac amyloidosis. Consult cardiology. Trish  emailed.  Assessment/Plan: Principal Problem:   MDD (major depressive disorder), recurrent severe, without psychosis (HCC) Active Problems:   MDD (major depressive disorder)   Anorexia   Dysphagia   Severe malnutrition (HCC)   Anxiety  Severe depression and anxiety Continue psychiatry medications [facility]wait[facility] One-to-one sitter at bedside for patient's own safety Continue Zyprexa and Remeron at night and lithium twice daily  Suspected cardiac amyloidosis 2D echo done today with suspicion for cardiac amyloidosis Consult cardiology in the am Trish emailed for consult  Sinus tachycardia No clear etiology Continue to monitor closely Abn 12 lead ekg on 11/14/17; independently reviewed revealed inversed t-wave in lateral leads. Monitor on telemetry  Severe protein calorie malnutrition due to anorexia Continue core track Dietitian following Continue to monitor electrolytes Continue appetite stimulants Continue free water flushes 300 cc q6h Unremarkable studies of phosphorus, magnesium, and BMP  Resolved AKI Hold off IV fluid to avoid fluid overload C/w free water flushes 300 cc 4 times daily  Iron deficiency anemia  Low saturation ratios on 09/08/2017 Continue ferrous sulfate supplement No sign of overt bleeding  Physical debility/ambulatory dysfunction Worked with OT no further recommendations   Code Status: Full code  Family Communication: None at bedside  Disposition Plan: Discharge to psych hospital when bed placement is available   Consultants:  Psychiatry  Procedures:  None  Antimicrobials:  None  DVT prophylaxis: Subcu Lovenox daily   Objective: Vitals:   11/15/17 0639 11/15/17 0823 11/15/17 1247 11/15/17 1655  BP: (!) 148/81 (!) 147/88 140/78 135/72  Pulse: (!) 103 (!) 101 (!) 102 94  Resp: 20 19 20 20   Temp: 97.8 F (36.6 C) 98.8 F (37.1 C) 98.4 F (36.9 C) 97.7 F (36.5 C)  TempSrc: Axillary  Oral Oral Oral  SpO2: 98% 100% 98% 97%  Weight:      Height:        Intake/Output Summary (Last 24 hours) at 11/15/2017 1836 Last data filed at 11/15/2017 1212 Gross per 24 hour  Intake 858.25 ml  Output 1675 ml  Net -816.75 ml   Filed Weights   10/28/17 0500 11/06/17 0500 11/07/17 0500  Weight: 46.6 kg 46.6 kg 46.6 kg    Exam:  . General: 67 y.o. year-old female. Frail NAD alert and minimally interactive . Cardiovascular:RRR no rubs or gallops. No JVD..   . Respiratory: CTA no wheezing or rales . Abdomen: Soft nontender nondistended with normal bowel sounds x4 quadrants. . Musculoskeletal: Nonpitting edema in upper extremities bilaterally.  2/4 pulses in all 4 extremities. Marland Kitchen Psychiatry: Mood is appropriate for condition and setting.  Data Reviewed: CBC: Recent Labs  Lab 11/12/17 0712  WBC 7.3  NEUTROABS 4.7  HGB 12.0  HCT 38.6  MCV 93.2  PLT 389   Basic Metabolic Panel: Recent Labs  Lab 11/12/17 0712  NA 140  K 3.9  CL 107  CO2 29  GLUCOSE 147*  BUN 20  CREATININE 0.58  CALCIUM 9.7  MG 2.4  PHOS 3.3   GFR: Estimated Creatinine Clearance: 50.9 mL/min (by C-G formula based on SCr of 0.58 mg/dL). Liver Function Tests: Recent Labs  Lab 11/12/17 0712  AST 19  ALT 21  ALKPHOS 74  BILITOT 0.5  PROT 6.0*  ALBUMIN 2.7*   No results for input(s): LIPASE, AMYLASE in the last 168 hours. No results for input(s): AMMONIA in the last 168 hours. Coagulation Profile: No results for input(s): INR, PROTIME in the last 168 hours. Cardiac Enzymes: No results for input(s): CKTOTAL, CKMB, CKMBINDEX, TROPONINI in the last 168 hours. BNP (last 3 results) No results for input(s): PROBNP in the last 8760 hours. HbA1C: No results for input(s): HGBA1C in the last 72 hours. CBG: Recent Labs  Lab 11/14/17 1118 11/14/17 1659 11/15/17 0737 11/15/17 1139 11/15/17 1655  GLUCAP 121* 103* 103* 114* 111*   Lipid Profile: No results for input(s): CHOL, HDL, LDLCALC,  TRIG, CHOLHDL, LDLDIRECT in the last 72 hours. Thyroid Function Tests: No results for input(s): TSH, T4TOTAL, FREET4, T3FREE, THYROIDAB in the last 72 hours. Anemia Panel: No results for input(s): VITAMINB12, FOLATE, FERRITIN, TIBC, IRON, RETICCTPCT in the last 72 hours. Urine analysis:    Component Value Date/Time   COLORURINE AMBER (A) 10/23/2017 0106   APPEARANCEUR CLOUDY (A) 10/23/2017 0106   LABSPEC 1.009 10/23/2017 0106   PHURINE 9.0 (H) 10/23/2017 0106   GLUCOSEU NEGATIVE 10/23/2017 0106   HGBUR MODERATE (A) 10/23/2017 0106   BILIRUBINUR NEGATIVE 10/23/2017 0106   KETONESUR NEGATIVE 10/23/2017 0106   PROTEINUR NEGATIVE 10/23/2017 0106   UROBILINOGEN 0.2 12/15/2006 1654   NITRITE NEGATIVE 10/23/2017 0106   LEUKOCYTESUR MODERATE (A) 10/23/2017 0106   Sepsis Labs: @LABRCNTIP (procalcitonin:4,lacticidven:4)  )No results found for this or any previous visit (from the past 240 hour(s)).    Studies: No results found.  Scheduled Meds: . chlorhexidine  15 mL Mouth Rinse BID  . enoxaparin (LOVENOX) injection  30 mg Subcutaneous Q24H  . ferrous sulfate  325 mg Oral Q breakfast  . fluvoxaMINE  150 mg Per Tube QHS  . free water  300 mL Per Tube Q6H  . lithium citrate  300 mg Per Tube BID  . magnesium hydroxide  30 mL Per Tube Daily  . magnesium oxide  400 mg  Per Tube BID  . mouth rinse  15 mL Mouth Rinse q12n4p  . megestrol  400 mg Per Tube Daily  . mirtazapine  45 mg Per Tube QHS  . OLANZapine zydis  10 mg Sublingual QHS  . phosphorus  500 mg Oral TID WC  . polyethylene glycol  17 g Per Tube BID  . sennosides  5 mL Per Tube QHS  . simvastatin  10 mg Per Tube q1800  . sodium chloride flush  3 mL Intravenous Q12H  . thiamine  100 mg Oral Daily  . vitamin C  250 mg Per Tube BID    Continuous Infusions: . feeding supplement (OSMOLITE 1.2 CAL) 1,000 mL (11/15/17 1253)     LOS: 41 days     Darlin Drop, MD [facility] Pager [phone]  If 7PM-7AM, please  contact night-coverage [email or web address] Password [facility] 11/15/2017, 6:36 PM

## 2017-11-15 NOTE — Progress Notes (Signed)
  Echocardiogram 2D Echocardiogram has been performed.  Duane Amanda Hall G Amanda Hall [place] 11/15/2017, 10:31 AM

## 2017-11-16 DIAGNOSIS — R931 Abnormal findings on diagnostic imaging of heart and coronary circulation: Secondary | ICD-10-CM

## 2017-11-16 DIAGNOSIS — R9431 Abnormal electrocardiogram [ECG] [EKG]: Secondary | ICD-10-CM

## 2017-11-16 LAB — GLUCOSE, CAPILLARY
GLUCOSE-CAPILLARY: 113 mg/dL — AB (ref 70–99)
GLUCOSE-CAPILLARY: 111 mg/dL — AB (ref 70–99)
Glucose-Capillary: 129 mg/dL — ABNORMAL HIGH (ref 70–99)

## 2017-11-16 NOTE — Consult Note (Addendum)
Cardiology Consult    Patient ID: Avagrace Botelho MRN: [id number], DOB/AGE: 03-11-1950   Admit date: 10/05/2017 Date of Consult: 11/16/2017  Primary Physician: [name], MD Primary Cardiologist: New Consult (Dr. Eden Emms) Requesting Provider: Dow Adolph, DO  Patient Profile    Amanda Hall is a 67 y.o. female with a history of severe depression and anxiety, who is being seen today for the evaluation of cardiac amyloidosis at the request of Dr. Margo Aye.  History of Present Illness    Patient presented to the ED on 10/05/2017 with confusion, agitation, and refusal to take medications. She was admitted for acute encephalopathy and AKI with lactic acidosis. She was hypernatremic at 152 and hypokalemic 3.1 at time of admission which has since resolved. Psychiatry was consulted and has been guiding medical management. Urine culture on 10/05/2017 positive for aerococcus species so she was started on Ceftriaxone. Cortrak tube was placed on 10/17/2017 due to mood-induced anorexia and severe malnutrition and tube feedings were started. AKI has since resolved since time of admission. Patient is currently awaiting [facility] to [facility].   EKG on 11/14/2017 showed normal sinus rhythm with LVH and T wave inversion in V5 and V6. Echo was ordered which was suggestive for cardiac amyloidosis with presence of pericardial effusion, thickened valves, thickened atrial septum, concentric myocardial thickening, and RV wall thickening. Cardiology was consulted.  Patient extremely nervous on exam. History and physical limited due to patient's anxiety. She denies any history of chest pain but does report some occasional shortness of breath at rest. She has palpitations when she feels nervous and some lightheadedness/dizziness when she leans her head back really far. She reports sleeping at an incline at night but not because she has difficulty breathing when lying flat. She states she is just  more comfortable on an incline. She denies an lower extremity edema. She notes occasional brief episodes of left upper arm numbness/tingling. Last episode was yesterday. She reports some diarrhea here in the hospital due to some of the medications. Upon chart review, she had some constipation and is now on Milk of Magnesia and Miralax. She denies any nausea, vomiting, or abdominal pain. Patient has never smoked and has no known family history of heart disease.  Past Medical History   Past Medical History:  Diagnosis Date  . Allergy   . Anemia   . Anxiety   . Depression   . Hemorrhoids     Past Surgical History:  Procedure Laterality Date  . arm surgery     Left  . TUBAL LIGATION       Allergies  Allergies  Allergen Reactions  . Codeine Other (See Comments)    "Makes my head feel crazy"  . Sulfonamide Derivatives Nausea Only  . Tetanus Toxoid Other (See Comments)    "Made a Knot" (at site)    Inpatient Medications    . chlorhexidine  15 mL Mouth Rinse BID  . enoxaparin (LOVENOX) injection  30 mg Subcutaneous Q24H  . ferrous sulfate  325 mg Oral Q breakfast  . fluvoxaMINE  150 mg Per Tube QHS  . free water  300 mL Per Tube Q6H  . lithium citrate  300 mg Per Tube BID  . magnesium hydroxide  30 mL Per Tube Daily  . magnesium oxide  400 mg Per Tube BID  . mouth rinse  15 mL Mouth Rinse q12n4p  . megestrol  400 mg Per Tube Daily  . mirtazapine  45 mg Per Tube QHS  .  OLANZapine zydis  10 mg Sublingual QHS  . phosphorus  500 mg Oral TID WC  . polyethylene glycol  17 g Per Tube BID  . sennosides  5 mL Per Tube QHS  . simvastatin  10 mg Per Tube q1800  . sodium chloride flush  3 mL Intravenous Q12H  . thiamine  100 mg Oral Daily  . vitamin C  250 mg Per Tube BID    Family History    Family History  Problem Relation Age of Onset  . Stroke Mother   . Hypertension Sister   . Cancer Neg Hx        breat and colon hx  . Diabetes Neg Hx        family   She indicated  that her mother is alive. She indicated that her sister is alive. She indicated that the status of her neg hx is unknown.   Social History    Social History   Socioeconomic History  . Marital status: Divorced    Spouse name: Not on file  . Number of children: Not on file  . Years of education: Not on file  . Highest education level: Not on file  Occupational History  . Not on file  Social Needs  . Financial resource strain: Not on file  . Food insecurity:    Worry: Not on file    Inability: Not on file  . Transportation needs:    Medical: Not on file    Non-medical: Not on file  Tobacco Use  . Smoking status: Never Smoker  . Smokeless tobacco: Never Used  Substance and Sexual Activity  . Alcohol use: No  . Drug use: No  . Sexual activity: Not Currently  Lifestyle  . Physical activity:    Days per week: Not on file    Minutes per session: Not on file  . Stress: Not on file  Relationships  . Social connections:    Talks on phone: Not on file    Gets together: Not on file    Attends religious service: Not on file    Active member of club or organization: Not on file    Attends meetings of clubs or organizations: Not on file    Relationship status: Not on file  . Intimate partner violence:    Fear of current or ex partner: Not on file    Emotionally abused: Not on file    Physically abused: Not on file    Forced sexual activity: Not on file  Other Topics Concern  . Not on file  Social History Narrative  . Not on file     Review of Systems    Review of Systems  Constitutional: Negative for chills and fever.  HENT: Positive for congestion.   Eyes: Negative for blurred vision and double vision.  Respiratory: Positive for shortness of breath. Negative for cough.   Cardiovascular: Positive for palpitations (when nervous). Negative for chest pain, orthopnea, leg swelling and PND.  Gastrointestinal: Negative for abdominal pain, blood in stool, nausea and vomiting.    Genitourinary: Negative for hematuria.  Musculoskeletal: Negative for back pain.  Neurological: Positive for dizziness, tingling and weakness.  Psychiatric/Behavioral: Positive for depression. Negative for substance abuse. The patient is nervous/anxious.     Physical Exam    Blood pressure (!) 138/100, pulse (!) 105, temperature 98.8 F (37.1 C), temperature source Oral, resp. rate 17, height 5\' 2"  (1.575 m), weight 46.6 kg, SpO2 98 %.  General: 67 y.o.  very thin Caucasian, ill-appearing female resting comfortably in no acute distress. Very anxious. HEENT: Normocephalic and atraumatic. Scleral clear with no signs of icterus. NG tube in place. Neck: Supple.  Lungs: No increased work of breathing. Clear to auscultation anteriorly. No wheezes, rhonchi, or rales appreciated. Heart: Mildly tachycardic with regular rhythm. Distinct S1 and S2. No murmurs, gallops, or rubs.  Abdomen: Soft, non-distended, and non-tender to palpation. Bowel sounds present.  Extremities: No lower extremity edema. Radial pulses and distal pedal pulses 2+ and equal bilaterally. Neuro: Alert and oriented x3. No focal deficits. Moves all extremities spontaneously. Psych: Normal affect.  Labs    Troponin (Point of Care Test) No results for input(s): TROPIPOC in the last 72 hours. No results for input(s): CKTOTAL, CKMB, TROPONINI in the last 72 hours. Lab Results  Component Value Date   WBC 7.3 11/12/2017   HGB 12.0 11/12/2017   HCT 38.6 11/12/2017   MCV 93.2 11/12/2017   PLT 389 11/12/2017    Recent Labs  Lab 11/12/17 0712  NA 140  K 3.9  CL 107  CO2 29  BUN 20  CREATININE 0.58  CALCIUM 9.7  PROT 6.0*  BILITOT 0.5  ALKPHOS 74  ALT 21  AST 19  GLUCOSE 147*   Lab Results  Component Value Date   CHOL 189 03/22/2017   HDL 50.60 03/22/2017   LDLCALC 226 (H) 07/13/2016   TRIG 218.0 (H) 03/22/2017   No results found for: [facility]   Radiology Studies    No results found.  EKG     EKG: All the  EKGs below were personally reviewed:  - EKG on 10/05/2017 upon admission demonstrates: sinus tachycardia, rate 103, with LVH - EKG on 10/20/2017 demonstrates: accelerated junctional rhythm with new T wave inversion in V4, V5, and V6 - EKG on 11/14/2017 demonstrates: normal sinus rhythm, rate 88 bpm, with LVH and T wave inversions in V5 and V6 as well as some T wave flattening in inferior leads  Telemetry: Telemetry was personally reviewed and demonstrates: normal sinus rhythm/sinus tachycardia with heart rate between 70s and 100s bpm.  Cardiac Imaging    Echocardiogram 11/15/2017: Study Conclusions: - Left ventricle: The cavity size was normal. Systolic function was   vigorous. The estimated ejection fraction was in the range of 65%   to 70%. Wall motion was normal; there were no regional wall   motion abnormalities. Medial annulus E velocity: 3.42 cm/s. - Aortic valve: There was moderate regurgitation. - Aorta: The aortic sinuses of Valsalva when imaged in the   subcostal window appear mildly dilated (>40 mm). Consider cross   sectional imaging (CT or MRI) for more precise measurements. - Mitral valve: Severely thickened leaflets . Mobility was not   restricted. Transvalvular velocity was within the normal range.   There was no evidence for stenosis. There was trivial   regurgitation. - Left atrium: The atrium was moderately dilated. - Right ventricle: The cavity size was normal. Wall thickness was   increased. Systolic function was normal. - Inferior vena cava: The vessel was normal in size. The   respirophasic diameter changes were in the normal range (= 50%),   consistent with normal central venous pressure. - Pericardium, extracardiac: A moderate pericardial effusion was   identified, circumfirential but primarily anterior and along the   right atrial free wall. There is inspiratory variability in   mitral inflow, and right atrial diastolic collapse. There is a   normal IVC size  and collapse, and no right  ventricular diastolic   collapse. Hepatic vein Doppler was indeterminate. No definite   features of cardiac tamponade.  Impressions: - Echocardiogram strong suggestive of cardiac amyloidosis. Presence   of a pericardial effusion, thickened valves, thickened atrial   septum, and concentric myocardial thickening, as well as RV wall   thickening. Normal LVEF 65-70%, no regional wall motion   abnormalities. Moderate aortic valve regurgitation. Possible   dilation of the sinuses of Valsalva. Severely thickened mitral   valve leaflets with trivial regurgitation. Moderate LA   enlargement. Moderate pericardial effusion without current   features of tamponade physiology.  Recommendations: If clinically indicated: 1) Consider evaluation of cardiac amyloidosis with serum and urine protein electrophoresis with immunofixation, cardiac MRI and/or nuclear medicine PYP study. 2) Consider cardiac CT or cardiac MR to better evaluate aortic sinus of Valsalva possible dilation.  Assessment & Plan    1. Possible Cardiac Amyloidosis - Detailed history and exam difficult to obtain due to patient's severe anxiety.  - EKG on 11/14/2017 showed normal sinus rhythm, rate 88 bpm, with LVH and T wave inversion in V5 and V6 as well as some T wave flattening in inferior leads.  - Echo yesterday was suggestive of cardiac amyloidosis with the presence of pericardial effusion, thickened valves, thickened atrial septum, concentric myocardial thickening, and RV wall thickening. LVEF normal at 65-70%. No wall motion abnormalities. - Will discuss with MD about further imagining.  - Suspect anxiety is probably driving BP and heart rate. Will hold off on adding any medications at this time.  2. Hyperlipidemia - LDL 226 in 07/2016. - Currently on Simvastatin 40mg  daily at home.  - Will recheck lipid panel.   Signed, Corrin Parker, PA-C 11/16/2017, 10:24 AM  For questions or updates,  please contact   Please consult [email or web address] for contact info under Cardiology/STEMI.  Patient examined chart reviewed. Unfortunate female with profound depression and almost catatonic home existence Lives with sister. No cardiac complaints. Exam with thin frail female , feeding tube. Diffuse tremulousness no murmur Lungs clear no edema. Reviewed her TTE and ECG;s. Would be very unusual for this to me amyloid with so much voltage On her ECG. She would not likely be able to tolerate a cardiac MRI which would be best diagnostic test Ok to go to inpatient Psychiatry hospital. Will arrange outpatient f/u in our office and consider cMRI or nuclear scanning for transthyretin amyloid When her psychiatric and physical condition is improved. Will sign off  Charlton Haws

## 2017-11-16 NOTE — Care Management Note (Addendum)
Case Management Note  Patient Details  Name: Taheera Thomann MRN: [id number] Date of Birth: November 21, 1950  Subjective/Objective:  Pt presented for AKI and acute encephalopathy. Hx of Depression-psych consulted recommended inpatient psych. CSW is following for disposition needs. Awaiting Bed at [facility].                  Action/Plan: CM will continue to monitor for additional disposition needs.   Expected Discharge Date:                  Expected Discharge Plan:  Psychiatric Hospital  In-House Referral:  Clinical Social Work  Discharge planning Services  CM Consult  Post Acute Care Choice:  NA Choice offered to:  NA  DME Arranged:  N/A DME Agency:  NA  HH Arranged:  NA HH Agency:  NA  Status of Service:  Completed, signed off  If discussed at Long Length of Stay Meetings, dates discussed:    Additional Comments:  1435 12-20-17 Tomi Bamberger, RN,BSN [phone] Patient with AKI- poor po intake post PEG tube placement 11-18-17. Hx of depressive disorder-psych re-consulted and states gradual improvement. Per Psych most beneficial to transition to SNF. CSW assisting with transition of care needs to facility. CM will continue to monitor for additional transition of care needs.  Gala Lewandowsky, RN 11/16/2017, 2:09 PM

## 2017-11-16 NOTE — Progress Notes (Signed)
PT Cancellation Note  Patient Details Name: Lorre Opdahl MRN: [id number] DOB: 1950-03-21   Cancelled Treatment:    Reason Eval/Treat Not Completed: Other (comment).  Pt refused and will try again at another time.   Ivar Drape 11/16/2017, 11:08 AM   Samul Dada, PT MS Acute Rehab Dept. Number: [facility] [id number] and [facility] [phone]

## 2017-11-16 NOTE — Progress Notes (Signed)
PROGRESS NOTE  Amanda Hall UJW:[id number] DOB: [birth year] DOA: 10/05/2017 PCP: [name], MD  HPI/Recap of past 24 hours: 67 y.o.femalewith a history of depression and anxiety with recent psychiatric hospitalization who presented 9/25 with confusion, agitation, refusal to take medications or per oral intake found to have AKI and acute encephalopathy for which she was admitted. Psychiatry was consulted and guiding medical management. Due to mood-induced anorexia and severe malnutrition, cortrak was placed 10/7 and tube feedings started. Urine culture grew aerococcus for which ceftriaxone was started. The patient continues to have severe psychiatric symptoms, predominantly anxiety, and [facility]  11/09/2017: Patient seen and examined at bedside.  No acute events overnight.  Patient is very anxious and minimally interactive.  Appears edematous with third spacing on exam; will discontinue IV fluid.  11/10/17: Somnolent this am. Sitter at bedside. No acute distress. Awaiting bed placement at psych hospital.  11/11/2017: Patient seen and examined at her bedside.  Sitter is present in the room.  No acute events overnight.  Patient is more interactive today.  She denies any chest pain dyspnea or palpitations.  Also denies any cough or nausea.  Cortrack is still in place.  11/12/2017: Patient seen and examined at bedside with sitter present.  Anxious and minimally interactive.  She has no new complaints.  No acute events overnight.  Lab studies and physical exam essentially unremarkable.  11/13/2017: Patient seen and examined with sitter at bedside.  No new complaints.  No acute events overnight.  Awaiting placement at psych hospital.  11/14/17: No acute issues. Waiting for placement.  11/15/17: no acute events overnight. Abnormal ekg. 2D echo obtained and revealed suspected cardiac amyloidosis. Consult cardiology.   11/16/2017: Patient seen and  examined at bedside.  No acute events overnight.  She is anxious today.  Cardiology following for suspected cardiac amyloidosis.  Assessment/Plan: Principal Problem:   MDD (major depressive disorder), recurrent severe, without psychosis (HCC) Active Problems:   MDD (major depressive disorder)   Anorexia   Dysphagia   Severe malnutrition (HCC)   Anxiety  Severe depression and anxiety Continue psychiatry medications [facility] One-to-one sitter at bedside for patient's own safety Continue Zyprexa and Remeron at night and lithium twice daily  Suspected cardiac amyloidosis 2D echo done on 11/15/2017 with suspicion for cardiac amyloidosis Cardiology consulted and following.  Sinus tachycardia No clear etiology Continue to monitor closely 12 lead ekg on 11/14/17; independently reviewed revealed inversed t-wave in lateral leads. Monitor on telemetry  Severe protein calorie malnutrition due to anorexia Continue core track Dietitian following Continue to monitor electrolytes Continue appetite stimulants Continue free water flushes 300 cc q6h Unremarkable studies of phosphorus, magnesium, and BMP  Resolved AKI Hold off IV fluid to avoid fluid overload C/w free water flushes 300 cc 4 times daily  Iron deficiency anemia  Low saturation ratios on 09/08/2017 Continue ferrous sulfate supplement No sign of overt bleeding  Physical debility/ambulatory dysfunction Worked with OT no further recommendations   Code Status: Full code  Family Communication: None at bedside  Disposition Plan: Discharge to psych hospital when bed placement is available   Consultants:  Psychiatry  Procedures:  None  Antimicrobials:  None  DVT prophylaxis: Subcu Lovenox daily   Objective: Vitals:   11/15/17 1655 11/15/17 2015 11/16/17 0416 11/16/17 0803  BP: 135/72 (!) 118/57 (!) 144/77 (!) 138/100  Pulse: 94 89 98 (!) 105  Resp: 20 18 16  17  Temp: 97.7 F (36.5 C) 98.1 F (36.7 C) 98.8 F (37.1 C)   TempSrc: Oral Oral Oral   SpO2: 97% 97% 100% 98%  Weight:      Height:        Intake/Output Summary (Last 24 hours) at 11/16/2017 1305 Last data filed at 11/16/2017 1126 Gross per 24 hour  Intake 932.67 ml  Output 1400 ml  Net -467.33 ml   Filed Weights   10/28/17 0500 11/06/17 0500 11/07/17 0500  Weight: 46.6 kg 46.6 kg 46.6 kg    Exam:  . General: 67 y.o. year-old female.  Frail appears anxious.  Alert and oriented x3. . Cardiovascular: Regular rate and rhythm with no rubs or gallops.  No JVD or thyromegaly noted. Marland Kitchen Respiratory: Clear to auscultation with no wheezes or rales.  Poor inspiratory effort. . Abdomen: Soft nontender nondistended with normal bowel sounds x4 quadrants. . Musculoskeletal: No edema.  2/4 pulses in all 4 extremities. Marland Kitchen Psychiatry: Mood is appropriate for condition and setting.  Data Reviewed: CBC: Recent Labs  Lab 11/12/17 0712  WBC 7.3  NEUTROABS 4.7  HGB 12.0  HCT 38.6  MCV 93.2  PLT 389   Basic Metabolic Panel: Recent Labs  Lab 11/12/17 0712  NA 140  K 3.9  CL 107  CO2 29  GLUCOSE 147*  BUN 20  CREATININE 0.58  CALCIUM 9.7  MG 2.4  PHOS 3.3   GFR: Estimated Creatinine Clearance: 50.9 mL/min (by C-G formula based on SCr of 0.58 mg/dL). Liver Function Tests: Recent Labs  Lab 11/12/17 0712  AST 19  ALT 21  ALKPHOS 74  BILITOT 0.5  PROT 6.0*  ALBUMIN 2.7*   No results for input(s): LIPASE, AMYLASE in the last 168 hours. No results for input(s): AMMONIA in the last 168 hours. Coagulation Profile: No results for input(s): INR, PROTIME in the last 168 hours. Cardiac Enzymes: No results for input(s): CKTOTAL, CKMB, CKMBINDEX, TROPONINI in the last 168 hours. BNP (last 3 results) No results for input(s): PROBNP in the last 8760 hours. HbA1C: No results for input(s): HGBA1C in the last 72 hours. CBG: Recent Labs  Lab 11/15/17 0737 11/15/17 1139  11/15/17 1655 11/15/17 2150 11/16/17 0802  GLUCAP 103* 114* 111* 99 129*   Lipid Profile: No results for input(s): CHOL, HDL, LDLCALC, TRIG, CHOLHDL, LDLDIRECT in the last 72 hours. Thyroid Function Tests: No results for input(s): TSH, T4TOTAL, FREET4, T3FREE, THYROIDAB in the last 72 hours. Anemia Panel: No results for input(s): VITAMINB12, FOLATE, FERRITIN, TIBC, IRON, RETICCTPCT in the last 72 hours. Urine analysis:    Component Value Date/Time   COLORURINE AMBER (A) 10/23/2017 0106   APPEARANCEUR CLOUDY (A) 10/23/2017 0106   LABSPEC 1.009 10/23/2017 0106   PHURINE 9.0 (H) 10/23/2017 0106   GLUCOSEU NEGATIVE 10/23/2017 0106   HGBUR MODERATE (A) 10/23/2017 0106   BILIRUBINUR NEGATIVE 10/23/2017 0106   KETONESUR NEGATIVE 10/23/2017 0106   PROTEINUR NEGATIVE 10/23/2017 0106   UROBILINOGEN 0.2 12/15/2006 1654   NITRITE NEGATIVE 10/23/2017 0106   LEUKOCYTESUR MODERATE (A) 10/23/2017 0106   Sepsis Labs: @LABRCNTIP (procalcitonin:4,lacticidven:4)  )No results found for this or any previous visit (from the past 240 hour(s)).    Studies: No results found.  Scheduled Meds: . chlorhexidine  15 mL Mouth Rinse BID  . enoxaparin (LOVENOX) injection  30 mg Subcutaneous Q24H  . ferrous sulfate  325 mg Oral Q breakfast  . fluvoxaMINE  150 mg Per Tube QHS  . free water  300 mL Per Tube Q6H  .  lithium citrate  300 mg Per Tube BID  . magnesium hydroxide  30 mL Per Tube Daily  . magnesium oxide  400 mg Per Tube BID  . mouth rinse  15 mL Mouth Rinse q12n4p  . megestrol  400 mg Per Tube Daily  . mirtazapine  45 mg Per Tube QHS  . OLANZapine zydis  10 mg Sublingual QHS  . phosphorus  500 mg Oral TID WC  . polyethylene glycol  17 g Per Tube BID  . sennosides  5 mL Per Tube QHS  . simvastatin  10 mg Per Tube q1800  . sodium chloride flush  3 mL Intravenous Q12H  . thiamine  100 mg Oral Daily  . vitamin C  250 mg Per Tube BID    Continuous Infusions: . feeding supplement (OSMOLITE  1.2 CAL) 1,000 mL (11/16/17 0947)     LOS: 42 days     Darlin Drop, MD [facility] Pager [phone]  If 7PM-7AM, please contact night-coverage [email or web address] Password [email or web address] 11/16/2017, 1:05 PM

## 2017-11-17 DIAGNOSIS — N179 Acute kidney failure, unspecified: Principal | ICD-10-CM

## 2017-11-17 LAB — GLUCOSE, CAPILLARY
GLUCOSE-CAPILLARY: 121 mg/dL — AB (ref 70–99)
GLUCOSE-CAPILLARY: 106 mg/dL — AB (ref 70–99)
Glucose-Capillary: 102 mg/dL — ABNORMAL HIGH (ref 70–99)
Glucose-Capillary: 131 mg/dL — ABNORMAL HIGH (ref 70–99)

## 2017-11-17 MED ORDER — NYSTATIN 100000 UNIT/ML MT SUSP
5.0000 mL | Freq: Four times a day (QID) | OROMUCOSAL | Status: DC
  Administered 2017-11-17 – 2017-12-22 (×97): 500000 [IU] via ORAL
  Filled 2017-11-17 (×101): qty 5

## 2017-11-17 MED ORDER — OLANZAPINE 5 MG PO TBDP
15.0000 mg | ORAL_TABLET | Freq: Every day | ORAL | Status: DC
  Administered 2017-11-17 – 2017-12-11 (×26): 15 mg via SUBLINGUAL
  Filled 2017-11-17 (×25): qty 3

## 2017-11-17 NOTE — Clinical Social Work Note (Signed)
CSW contacted [facility] Intake staff person Chrissie Noa and confirmed that patient remains on wait list. When asked where the patient is on wait list, CSW informed that there is no specific order that patient's are taken, and that for dementia and Gero-psych patient, the doctors select from the list the patient's they want to admit. This information was communicated with CSW Chiropodist Z. Brooks. CSW will continue to follow and facilitate discharge when a psych bed becomes available.  Genelle Bal, MSW, LCSW Licensed Clinical Social Worker Clinical Social Work Department [employer] [phone]

## 2017-11-17 NOTE — Progress Notes (Signed)
PT Cancellation Note  Patient Details Name: Amanda Hall MRN: [id number] DOB: 1950-08-13   Cancelled Treatment:    Reason Eval/Treat Not Completed: Patient declined, no reason specified. Pt declining participation in therapy, stating "I just don't feel good right now."  Encouragement provided for OOB to recliner or sitting EOB. Pt with increased anxiety and shaking as encouragement is provided. Sitter/NT present in room. Reports she will transfer pt to recliner later today if pt willing.   Ilda Foil 11/17/2017, 10:33 AM

## 2017-11-17 NOTE — Progress Notes (Signed)
PROGRESS NOTE  Amanda Hall ZOX:[id number] DOB: 08-12-1950 DOA: 10/05/2017 PCP: [name], MD  HPI/Recap of past 24 hours: 67 y.o.femalewith a history of depression and anxiety with recent psychiatric hospitalization who presented 9/25 with confusion, agitation, refusal to take medications or per oral intake found to have AKI and acute encephalopathy for which she was admitted. Psychiatry was consulted and guiding medical management. Due to mood-induced anorexia and severe malnutrition, cortrak was placed 10/7 and tube feedings started. Urine culture grew aerococcus for which ceftriaxone was started. The patient continues to have severe psychiatric symptoms, predominantly anxiety, and is [facility].  11/09/2017: Patient seen and examined at bedside.  No acute events overnight.  Patient is very anxious and minimally interactive.  Appears edematous with third spacing on exam; will discontinue IV fluid.  11/10/17: Somnolent this am. Sitter at bedside. No acute distress. Awaiting bed placement at psych hospital.  11/11/2017: Patient seen and examined at her bedside.  Sitter is present in the room.  No acute events overnight.  Patient is more interactive today.  She denies any chest pain dyspnea or palpitations.  Also denies any cough or nausea.  Cortrack is still in place.  11/12/2017: Patient seen and examined at bedside with sitter present.  Anxious and minimally interactive.  She has no new complaints.  No acute events overnight.  Lab studies and physical exam essentially unremarkable.  11/13/2017: Patient seen and examined with sitter at bedside.  No new complaints.  No acute events overnight.  Awaiting placement at psych hospital.  11/14/17: No acute issues. Waiting for placement.  11/15/17: no acute events overnight. Abnormal ekg. 2D echo obtained and revealed suspected cardiac amyloidosis. Consult cardiology.   11/16/2017: Patient seen and  examined at bedside.  No acute events overnight.  She is anxious today.  Cardiology following for suspected cardiac amyloidosis.  11/17/2017: Seen and examined at bedside seated present.  No acute events overnight.  Cardiology signed off and will see outpatient for cardiac MRI or nuclear scanning once her anxiety is improved.  Assessment/Plan: Principal Problem:   MDD (major depressive disorder), recurrent severe, without psychosis (HCC) Active Problems:   MDD (major depressive disorder)   Anorexia   Dysphagia   Severe malnutrition (HCC)   Anxiety  Severe depression and anxiety Continue psychiatry medications [facility] One-to-one sitter at bedside for patient's own safety Continue Zyprexa and Remeron at night and lithium twice daily Will reconsult psych to reassess severe anxiety  Suspected cardiac amyloidosis 2D echo done on 11/15/2017 with suspicion for cardiac amyloidosis Cardiology consulted and signed off Will see outpatient once anxiety is improved for possible cardiac MRI or nuclear scanning.  Sinus tachycardia, resolved No clear etiology Continue to monitor closely 12 lead ekg on 11/14/17; independently reviewed revealed inversed t-wave in lateral leads. Monitor on telemetry  Severe protein calorie malnutrition due to anorexia Continue core track Dietitian following Continue to monitor electrolytes Continue appetite stimulants Continue free water flushes 300 cc q6h Unremarkable studies of phosphorus, magnesium, and BMP  Resolved AKI Hold off IV fluid to avoid fluid overload C/w free water flushes 300 cc 4 times daily  Iron deficiency anemia  Low saturation ratios on 09/08/2017 Continue ferrous sulfate supplement No sign of overt bleeding  Physical debility/ambulatory dysfunction Worked with OT no further recommendations   Code Status: Full code  Family Communication: None at bedside  Disposition Plan: Discharge  to psych hospital when bed placement is  available   Consultants:  Psychiatry  Procedures:  None  Antimicrobials:  None  DVT prophylaxis: Subcu Lovenox daily   Objective: Vitals:   11/16/17 1655 11/16/17 2110 11/17/17 0422 11/17/17 0859  BP: 125/89 122/81 137/76 (!) 153/99  Pulse: (!) 115 84 (!) 103 (!) 155  Resp: 18 12 16 19   Temp: 97.8 F (36.6 C) 97.6 F (36.4 C) (!) 97.5 F (36.4 C) 97.6 F (36.4 C)  TempSrc: Skin Oral Oral Oral  SpO2: 96% 99% 98% 98%  Weight:      Height:        Intake/Output Summary (Last 24 hours) at 11/17/2017 1155 Last data filed at 11/17/2017 1038 Gross per 24 hour  Intake 2143.67 ml  Output 1650 ml  Net 493.67 ml   Filed Weights   10/28/17 0500 11/06/17 0500 11/07/17 0500  Weight: 46.6 kg 46.6 kg 46.6 kg    Exam:  . General: 67 y.o. year-old female.  Frail and anxious.  Alert and oriented x3.  Shivering. . Cardiovascular: Regular rate and rhythm with no rubs or gallops.  No JVD or thyromegaly noted. Marland Kitchen Respiratory: Clear to auscultation with no wheezes or rales.  Poor inspiratory effort. . Abdomen: Soft nontender nondistended with normal bowel sounds x4 quadrants. . Musculoskeletal: No edema.  2/4 pulses in all 4 extremities. Marland Kitchen Psychiatry: Mood is appropriate for condition and setting.  Data Reviewed: CBC: Recent Labs  Lab 11/12/17 0712  WBC 7.3  NEUTROABS 4.7  HGB 12.0  HCT 38.6  MCV 93.2  PLT 389   Basic Metabolic Panel: Recent Labs  Lab 11/12/17 0712  NA 140  K 3.9  CL 107  CO2 29  GLUCOSE 147*  BUN 20  CREATININE 0.58  CALCIUM 9.7  MG 2.4  PHOS 3.3   GFR: Estimated Creatinine Clearance: 50.9 mL/min (by C-G formula based on SCr of 0.58 mg/dL). Liver Function Tests: Recent Labs  Lab 11/12/17 0712  AST 19  ALT 21  ALKPHOS 74  BILITOT 0.5  PROT 6.0*  ALBUMIN 2.7*   No results for input(s): LIPASE, AMYLASE in the last 168 hours. No results for input(s): AMMONIA in the last 168 hours. Coagulation  Profile: No results for input(s): INR, PROTIME in the last 168 hours. Cardiac Enzymes: No results for input(s): CKTOTAL, CKMB, CKMBINDEX, TROPONINI in the last 168 hours. BNP (last 3 results) No results for input(s): PROBNP in the last 8760 hours. HbA1C: No results for input(s): HGBA1C in the last 72 hours. CBG: Recent Labs  Lab 11/16/17 0802 11/16/17 1702 11/16/17 2112 11/17/17 0733 11/17/17 1116  GLUCAP 129* 113* 111* 102* 131*   Lipid Profile: No results for input(s): CHOL, HDL, LDLCALC, TRIG, CHOLHDL, LDLDIRECT in the last 72 hours. Thyroid Function Tests: No results for input(s): TSH, T4TOTAL, FREET4, T3FREE, THYROIDAB in the last 72 hours. Anemia Panel: No results for input(s): VITAMINB12, FOLATE, FERRITIN, TIBC, IRON, RETICCTPCT in the last 72 hours. Urine analysis:    Component Value Date/Time   COLORURINE AMBER (A) 10/23/2017 0106   APPEARANCEUR CLOUDY (A) 10/23/2017 0106   LABSPEC 1.009 10/23/2017 0106   PHURINE 9.0 (H) 10/23/2017 0106   GLUCOSEU NEGATIVE 10/23/2017 0106   HGBUR MODERATE (A) 10/23/2017 0106   BILIRUBINUR NEGATIVE 10/23/2017 0106   KETONESUR NEGATIVE 10/23/2017 0106   PROTEINUR NEGATIVE 10/23/2017 0106   UROBILINOGEN 0.2 12/15/2006 1654   NITRITE NEGATIVE 10/23/2017 0106   LEUKOCYTESUR MODERATE (A) 10/23/2017 0106   Sepsis Labs: @LABRCNTIP (procalcitonin:4,lacticidven:4)  )No results found for this or any previous  visit (from the past 240 hour(s)).    Studies: No results found.  Scheduled Meds: . chlorhexidine  15 mL Mouth Rinse BID  . enoxaparin (LOVENOX) injection  30 mg Subcutaneous Q24H  . ferrous sulfate  325 mg Oral Q breakfast  . fluvoxaMINE  150 mg Per Tube QHS  . free water  300 mL Per Tube Q6H  . lithium citrate  300 mg Per Tube BID  . magnesium hydroxide  30 mL Per Tube Daily  . magnesium oxide  400 mg Per Tube BID  . mouth rinse  15 mL Mouth Rinse q12n4p  . megestrol  400 mg Per Tube Daily  . mirtazapine  45 mg Per Tube  QHS  . OLANZapine zydis  10 mg Sublingual QHS  . phosphorus  500 mg Oral TID WC  . polyethylene glycol  17 g Per Tube BID  . sennosides  5 mL Per Tube QHS  . simvastatin  10 mg Per Tube q1800  . sodium chloride flush  3 mL Intravenous Q12H  . thiamine  100 mg Oral Daily  . vitamin C  250 mg Per Tube BID    Continuous Infusions: . feeding supplement (OSMOLITE 1.2 CAL) 1,000 mL (11/16/17 0947)     LOS: 43 days     Darlin Drop, MD [facility] Pager [phone]  If 7PM-7AM, please contact night-coverage [email or web address] Password [email or web address] 11/17/2017, 11:55 AM

## 2017-11-17 NOTE — Consult Note (Addendum)
[facility] Psych Consult Progress Note  11/17/2017 12:39 PM Amanda Hall  MRN:  [id number] Subjective:   Amanda Hall was last seen on 10/31 for medication management. She continues to warrant inpatient psychiatric hospitalization. Today, she reports that she is more anxious than her baseline. She is observed to be trembling. She reports worries about transfer from the hospital. She denies SI, HI or AVH. She denies problems with sleep. She continues to have a poor appetite.   Principal Problem: MDD (major depressive disorder), recurrent severe, without psychosis (HCC) Diagnosis:   Patient Active Problem List   Diagnosis Date Noted  . Anxiety [F41.9] 10/17/2017  . Dysphagia [R13.10] 10/12/2017  . Severe malnutrition (HCC) [E43] 10/12/2017  . MDD (major depressive disorder), recurrent severe, without psychosis (HCC) [F33.2]   . Anorexia [R63.0] 10/05/2017  . Uremia [N19]   . Protein-calorie malnutrition, severe [E43] 08/29/2017  . Generalized anxiety disorder [F41.1] 05/26/2017  . MDD (major depressive disorder) [F32.9] 05/26/2017  . Hypercholesterolemia [E78.00] 03/22/2017  . Nonspecific (abnormal) findings on radiological and other examination of body structure [793] 12/26/2006  . NONSPECIFIC ABNORM FIND RAD&OTH EXAM LUNG FIELD [R93.0] 12/26/2006  . ANEMIA-IRON DEFICIENCY [D50.9] 12/15/2006  . Depression [F32.9] 12/15/2006  . Allergic rhinitis [J30.9] 12/15/2006   Total Time spent with patient: 30 minutes  Past Psychiatric History: MDD and anxiety.  Past Medical History:  Past Medical History:  Diagnosis Date  . Allergy   . Anemia   . Anxiety   . Depression   . Hemorrhoids     Past Surgical History:  Procedure Laterality Date  . arm surgery     Left  . TUBAL LIGATION     Family History:  Family History  Problem Relation Age of Onset  . Stroke Mother   . Hypertension Sister   . Cancer Neg Hx        breat and colon hx  . Diabetes Neg Hx        family   Family Psychiatric   History: Grandfather-depression and committed suicide.  Social History:  Social History   Substance and Sexual Activity  Alcohol Use No     Social History   Substance and Sexual Activity  Drug Use No    Social History   Socioeconomic History  . Marital status: Divorced    Spouse name: Not on file  . Number of children: Not on file  . Years of education: Not on file  . Highest education level: Not on file  Occupational History  . Not on file  Social Needs  . Financial resource strain: Not on file  . Food insecurity:    Worry: Not on file    Inability: Not on file  . Transportation needs:    Medical: Not on file    Non-medical: Not on file  Tobacco Use  . Smoking status: Never Smoker  . Smokeless tobacco: Never Used  Substance and Sexual Activity  . Alcohol use: No  . Drug use: No  . Sexual activity: Not Currently  Lifestyle  . Physical activity:    Days per week: Not on file    Minutes per session: Not on file  . Stress: Not on file  Relationships  . Social connections:    Talks on phone: Not on file    Gets together: Not on file    Attends religious service: Not on file    Active member of club or organization: Not on file    Attends meetings of clubs or organizations:  Not on file    Relationship status: Not on file  Other Topics Concern  . Not on file  Social History Narrative  . Not on file    Sleep: Fair  Appetite:  Poor  Current Medications: Current Facility-Administered Medications  Medication Dose Route Frequency Provider Last Rate Last Dose  . acetaminophen (TYLENOL) tablet 650 mg  650 mg Oral Q6H PRN Standley Brooking, MD   650 mg at 11/16/17 2356   Or  . acetaminophen (TYLENOL) suppository 650 mg  650 mg Rectal Q6H PRN Standley Brooking, MD      . chlorhexidine (PERIDEX) 0.12 % solution 15 mL  15 mL Mouth Rinse BID Dow Adolph N, DO   15 mL at 11/17/17 0949  . clonazePAM (KLONOPIN) tablet 0.5 mg  0.5 mg Per Tube TID PRN Tyrone Nine, MD    0.5 mg at 11/17/17 1232  . enoxaparin (LOVENOX) injection 30 mg  30 mg Subcutaneous Q24H Standley Brooking, MD   30 mg at 11/17/17 0950  . feeding supplement (OSMOLITE 1.2 CAL) liquid 1,000 mL  1,000 mL Per Tube Continuous Regalado, Belkys A, MD 55 mL/hr at 11/16/17 0947 1,000 mL at 11/16/17 0947  . ferrous sulfate tablet 325 mg  325 mg Oral Q breakfast Darlin Drop, DO   325 mg at 11/17/17 0949  . fluvoxaMINE (LUVOX) tablet 150 mg  150 mg Per Tube QHS Blount, Xenia T, NP   150 mg at 11/16/17 2300  . free water 300 mL  300 mL Per Tube Q6H Hall, Carole N, DO   300 mL at 11/17/17 1232  . labetalol (NORMODYNE,TRANDATE) injection 5 mg  5 mg Intravenous Q2H PRN Jerald Kief, MD   5 mg at 11/04/17 1133  . lithium citrate 300 MG/5ML solution 300 mg  300 mg Per Tube BID Tyrone Nine, MD   300 mg at 11/17/17 0950  . loperamide (IMODIUM) capsule 2 mg  2 mg Oral PRN Jerald Kief, MD      . magnesium hydroxide (MILK OF MAGNESIA) suspension 30 mL  30 mL Per Tube Daily Blount, Xenia T, NP   30 mL at 11/17/17 0950  . magnesium oxide (MAG-OX) tablet 400 mg  400 mg Per Tube BID Regalado, Belkys A, MD   400 mg at 11/17/17 0949  . MEDLINE mouth rinse  15 mL Mouth Rinse q12n4p Dow Adolph N, DO   15 mL at 11/17/17 1236  . megestrol (MEGACE) 400 MG/10ML suspension 400 mg  400 mg Per Tube Daily Blount, Xenia T, NP   400 mg at 11/17/17 0950  . milk and molasses enema  1 enema Rectal Daily PRN Regalado, Belkys A, MD      . mirtazapine (REMERON SOL-TAB) disintegrating tablet 45 mg  45 mg Per Tube QHS Blount, Xenia T, NP   45 mg at 11/16/17 2300  . naphazoline-glycerin (CLEAR EYES REDNESS) ophth solution 2 drop  2 drop Both Eyes QID PRN Standley Brooking, MD      . OLANZapine zydis (ZYPREXA) disintegrating tablet 10 mg  10 mg Sublingual QHS Blount, Xenia T, NP   10 mg at 11/16/17 2300  . ondansetron (ZOFRAN) tablet 4 mg  4 mg Per Tube Q6H PRN Blount, Andi Devon T, NP       Or  . ondansetron (ZOFRAN) injection 4 mg   4 mg Intravenous Q6H PRN Audrea Muscat T, NP   4 mg at 11/15/17 0457  . phosphorus (K PHOS NEUTRAL)  tablet 500 mg  500 mg Oral TID WC Tyrone Nine, MD   500 mg at 11/17/17 1236  . polyethylene glycol (MIRALAX / GLYCOLAX) packet 17 g  17 g Per Tube BID Audrea Muscat T, NP   17 g at 11/17/17 0950  . sennosides (SENOKOT) 8.8 MG/5ML syrup 5 mL  5 mL Per Tube QHS Blount, Xenia T, NP   5 mL at 11/16/17 2300  . simvastatin (ZOCOR) tablet 10 mg  10 mg Per Tube q1800 Audrea Muscat T, NP   10 mg at 11/16/17 1753  . sodium chloride flush (NS) 0.9 % injection 3 mL  3 mL Intravenous Q12H Standley Brooking, MD   3 mL at 11/17/17 0952  . thiamine (VITAMIN B-1) tablet 100 mg  100 mg Oral Daily Tyrone Nine, MD   100 mg at 11/17/17 0949  . vitamin C (ASCORBIC ACID) tablet 250 mg  250 mg Per Tube BID Tyrone Nine, MD   250 mg at 11/17/17 1610    Lab Results:  Results for orders placed or performed during the hospital encounter of 10/05/17 (from the past 48 hour(s))  Glucose, capillary     Status: Abnormal   Collection Time: 11/15/17  4:55 PM  Result Value Ref Range   Glucose-Capillary 111 (H) 70 - 99 mg/dL  Glucose, capillary     Status: None   Collection Time: 11/15/17  9:50 PM  Result Value Ref Range   Glucose-Capillary 99 70 - 99 mg/dL  Glucose, capillary     Status: Abnormal   Collection Time: 11/16/17  8:02 AM  Result Value Ref Range   Glucose-Capillary 129 (H) 70 - 99 mg/dL  Glucose, capillary     Status: Abnormal   Collection Time: 11/16/17  5:02 PM  Result Value Ref Range   Glucose-Capillary 113 (H) 70 - 99 mg/dL  Glucose, capillary     Status: Abnormal   Collection Time: 11/16/17  9:12 PM  Result Value Ref Range   Glucose-Capillary 111 (H) 70 - 99 mg/dL  Glucose, capillary     Status: Abnormal   Collection Time: 11/17/17  7:33 AM  Result Value Ref Range   Glucose-Capillary 102 (H) 70 - 99 mg/dL  Glucose, capillary     Status: Abnormal   Collection Time: 11/17/17 11:16 AM  Result  Value Ref Range   Glucose-Capillary 131 (H) 70 - 99 mg/dL    Blood Alcohol level:  Lab Results  Component Value Date   ETH <10 06/12/2017   ETH <10 05/25/2017    Musculoskeletal: Strength & Muscle Tone: Generalized weakness and physical deconditioning.  Gait & Station: unable to stand Patient leans: N/A  Psychiatric Specialty Exam: Physical Exam  Nursing note and vitals reviewed. Constitutional: She is oriented to person, place, and time. She appears well-developed and well-nourished.  HENT:  Head: Normocephalic and atraumatic.  Neck: Normal range of motion.  Respiratory: Effort normal.  Musculoskeletal: Normal range of motion.  Neurological: She is alert and oriented to person, place, and time.  Psychiatric: Judgment and thought content normal. Her mood appears anxious. Her speech is slurred. She is slowed. Cognition and memory are impaired.    Review of Systems  Constitutional: Negative for chills and fever.  Cardiovascular: Negative for chest pain.  Gastrointestinal: Positive for diarrhea. Negative for abdominal pain, constipation, nausea and vomiting.  Psychiatric/Behavioral: Negative for hallucinations, substance abuse and suicidal ideas. The patient is nervous/anxious. The patient does not have insomnia.   All other systems  reviewed and are negative.   Blood pressure (!) 153/99, pulse (!) 155, temperature 97.6 F (36.4 C), temperature source Oral, resp. rate 19, height 5\' 2"  (1.575 m), weight 46.6 kg, SpO2 98 %.Body mass index is 18.79 kg/m.  General Appearance: Fairly Groomed, elderly, cachetic, Caucasian female, wearing a hospital gown and lying in bed. NAD.   Eye Contact:  Fair  Speech:  Slow and Slurred  Volume:  Decreased  Mood:  Anxious  Affect:  Congruent  Thought Process:  Goal Directed, Linear and Descriptions of Associations: Intact  Orientation:  Full (Time, Place, and Person)  Thought Content:  Logical  Suicidal Thoughts:  No  Homicidal Thoughts:  No   Memory:  Immediate;   Fair Recent;   Fair Remote;   Fair  Judgement:  Poor  Insight:  Lacking  Psychomotor Activity:  Psychomotor Retardation  Concentration:  Concentration: Good and Attention Span: Good  Recall:  Fair  Fund of Knowledge:  Fair  Language:  Good  Akathisia:  No  Handed:  Right  AIMS (if indicated):   N/A  Assets:  Communication Skills Housing Social Support  ADL's:  Impaired  Cognition: Impaired due to psychiatric condition.   Sleep:   Fair    Assessment:  Amanda Hall is a 67 y.o. female who was admitted with AKI secondary to poor PO intake. She continues to exhibit signs and symptoms of depression and anxiety including psychomotor retardation and poor appetite with significant weight loss requiring tube feeds. She continues to warrant inpatient psychiatric hospitalization for stabilization and treatment. She may benefit from a non medication treatment modality such as ECT (if medically stable enough to undergo) since she appears to have refractory depression and anxiety.   Treatment Plan Summary: -Patient warrants inpatient psychiatric hospitalizationfor severe depression and anxiety causing significant impairments in daily functioning with poor PO intake and severe malnutrition. -Continue bedside sitter. -DiscontinueLithium since it does not appear to be improving mood/anxiety. -IncreaseZyprexa 10 mg qhs to 15 mg qhs for mood stabilization and anxiety. -Continue Luvox 150 mg qhsfor depression and anxietyandRemeron 45 mg qhsfor depression and anxiety. -EKG reviewed and QTc435 on 11/4. Please closely monitor when starting or increasing QTc prolonging agents. -Patient is IVC'd so she may not leave the hospital. -Discussed with primary team about palliative care involvement since patient's condition appears to continue to deteriorate.  -Willfollow patient as needed.  Cherly Beach, DO 11/17/2017, 12:39 PM

## 2017-11-17 NOTE — Progress Notes (Signed)
OT Cancellation Note  Patient Details Name: Amanda Hall MRN: [id number] DOB: [birth year]   Cancelled Treatment:    Reason Eval/Treat Not Completed: Other (comment)(RN reporting pt just fell asleep. Asking to hold till later this afternoon. RN also reports that pt has been anxious this morning. Will return as schedule allows. Thank you.)  Blakley Michna M Serai Tukes Bryer Gottsch MSOT, OTR/L Acute Rehab Pager: [phone] Office: [phone] 11/17/2017, 2:11 PM

## 2017-11-18 LAB — GLUCOSE, CAPILLARY
GLUCOSE-CAPILLARY: 141 mg/dL — AB (ref 70–99)
GLUCOSE-CAPILLARY: 113 mg/dL — AB (ref 70–99)
Glucose-Capillary: 116 mg/dL — ABNORMAL HIGH (ref 70–99)
Glucose-Capillary: 125 mg/dL — ABNORMAL HIGH (ref 70–99)

## 2017-11-18 NOTE — Progress Notes (Addendum)
Physical Therapy Treatment and Discharge Patient Details Name: Amanda Hall MRN: [id number] DOB: [birth year] Today's Date: 11/18/2017    History of Present Illness Pt is a 67 y/o female admitted secondary to major depressive disorder. Pt with increased confusion and agitation prior to admission. Found to have AKI and acute encephalopathy. CT negative for acute abnormality. PMH includes anxiety and depression.     PT Comments    Pt has been on PT caseload for several weeks without any significant functional improvement noted. Pt has refused therapy x3 this week and would not even open eyes to participate with PT this session. At this time, I do not feel pt is appropriate for continued acute physical therapy, and we will sign off. Discussed with RN after session. If needs change, please reconsult.  Follow Up Recommendations  Supervision/Assistance - 24 hour;Other (Inpatient Psych)     Equipment Recommendations  None recommended by PT    Recommendations for Other Services       Precautions / Restrictions Precautions Precautions: Fall Precaution Comments: Recruitment consultant, cortrack tube Restrictions Weight Bearing Restrictions: No    Mobility  Bed Mobility  General bed mobility comments: Unable to attempt   Modified Rankin (Stroke Patients Only)    Balance  Sitting balance - Comments: Unable to assess     Cognition   Behavior During Therapy: Anxious Overall Cognitive Status: No family/caregiver present to determine baseline cognitive functioning                                 General Comments: Pt laying in bed with eyes closed and tremoring. Pt refused to open eyes at all during session although did not appear to be asleep as she would occasionally answer therapist.       Exercises Other Exercises Other Exercises: Attempted an ADL task of wiping her mouth. Mod assist to raise L hand to mouth with washcloth on it, however she did not attempt to wipe her mouth off  at all once hand was to face.     General Comments        Pertinent Vitals/Pain Pain Assessment: Faces Faces Pain Scale: Hurts a little bit Pain Location: generalized pain with movement.  Pain Descriptors / Indicators: Grimacing Pain Intervention(s): Monitored during session     PT Goals (current goals can now be found in the care plan section) Acute Rehab PT Goals Patient Stated Goal: did not state PT Goal Formulation: Patient unable to participate in goal setting Progress towards PT goals: Not progressing toward goals - comment    Frequency           PT Plan Other (comment)(Acute PT to sign off)    Co-evaluation              AM-PAC PT "6 Clicks" Daily Activity  Outcome Measure  Difficulty turning over in bed (including adjusting bedclothes, sheets and blankets)?: Unable Difficulty moving from lying on back to sitting on the side of the bed? : Unable Difficulty sitting down on and standing up from a chair with arms (e.g., wheelchair, bedside commode, etc,.)?: Unable Help needed moving to and from a bed to chair (including a wheelchair)?: Total Help needed walking in hospital room?: Total Help needed climbing 3-5 steps with a railing? : Total 6 Click Score: 6    End of Session   Activity Tolerance: Other (comment)(Pt unwilling to participate) Patient left: in bed;with call bell/phone within reach;with nursing/sitter  in room Nurse Communication: Other (comment)(Updated nurse on mobility status - PT signing off) PT Visit Diagnosis: Unsteadiness on feet (R26.81);Muscle weakness (generalized) (M62.81)     Time: 1610-9604 PT Time Calculation (min) (ACUTE ONLY): 9 min  Charges:  $Therapeutic Activity: 8-22 mins                     Amanda Hall, PT, DPT Acute Rehabilitation Services Pager: [phone] Office: [phone]    Amanda Hall 11/18/2017, 2:35 PM

## 2017-11-18 NOTE — Progress Notes (Signed)
Nutrition Follow-up  DOCUMENTATION CODES:   Severe malnutrition in context of social or environmental circumstances, Underweight  INTERVENTION:   Tube feeding:  Continue Osmolite 1.2 @ 55 ml/hr via Cortrak tube Continue Free Water flush of 300 mL q 6 hours  Recommend rechecking BMP  NUTRITION DIAGNOSIS:   Severe Malnutrition related to social / environmental circumstances(severe major depressive disorder, refractory depression, anxiety) as evidenced by severe fat depletion, severe muscle depletion.  Being addressed via TF, appetite stimulants, supplements  GOAL:   Patient will meet greater than or equal to 90% of their needs  Met via TF  MONITOR:   PO intake, Supplement acceptance, Labs, Weight trends  REASON FOR ASSESSMENT:   Malnutrition Screening Tool    ASSESSMENT:   67 yo female admitted with AKI, dehydration, anorexia with acute metabolic encephalopathy, FTT. Pt has been refusing to eat, drink or take medications. Pt has severe protein calorie malnutrition. Pt with recent inpatient psych hospitalization 1 month ago. Pt reports difficulty swallowing although pt's sister believes this to be subjective. Pt with MDD, severe, with psych consult. PMH includes anxiety, depression, malnutrition  11/07 Palliative Care consulted  Continues with 1:1 sitter; pt remains on wait list for [facility]  Osmolite 1.2 @ 55 ml/hr with free water flush of 300 mL q 6 hours via Cortrak tube  PO intake remains non-existent. 0-5% of meals. Mostly refuses. Pt liquid intake is also minimal. Taking sips   Weight relatively stable over the past month, remains up since admission however.   Labs:  CBGs 99-141; no BMP since 11/02 Meds:  Remeron, ferrous sulfate, mag ox, megace, phosphorus, thiamine, vitamin C  Diet Order:   Diet Order            DIET - DYS 1 Room service appropriate? Yes; Fluid consistency: Thin  Diet effective now              EDUCATION NEEDS:    Not appropriate for education at this time  Skin:  Skin Assessment: Reviewed RN Assessment  Last BM:  10/30  Height:   Ht Readings from Last 1 Encounters:  10/05/17 5' 2" (1.575 m)    Weight:   Wt Readings from Last 1 Encounters:  11/17/17 47.3 kg    Ideal Body Weight:  50 kg  BMI:  Body mass index is 19.07 kg/m.  Estimated Nutritional Needs:   Kcal:  1430-1640 kcals   Protein:  72-82 g   Fluid:  >/= 1.4 L   Cate  MS, RD, LDN, CNSC [phone] Pager  [phone] Weekend/On-Call Pager  

## 2017-11-18 NOTE — Progress Notes (Signed)
Palliative Medicine consult noted. Due to high referral volume, there will be a delay seeing this patient. If Amanda Hall remains at [facility] on Monday 11/11, she will be evaluated then. If she is discharged before then, we recommend outpatient palliative care to follow her if appropriate to continue GOC conversations with appropriate decision makers.   Please call the Palliative Medicine Team office at [phone] if recommendations are needed in the interim.  Thank you for inviting [place] to see this patient.  Margret Chance Cortez Steelman, RN, BSN, [facility] Palliative Medicine Team 11/18/2017 2:53 PM Office [phone]

## 2017-11-18 NOTE — Progress Notes (Signed)
PROGRESS NOTE  Amanda Hall ZOX:[id number] DOB: 1950/02/09 DOA: 10/05/2017 PCP: [name], MD  HPI/Recap of past 24 hours: 67 y.o.femalewith a history of depression and anxiety with recent psychiatric hospitalization who presented 9/25 with confusion, agitation, refusal to take medications or per oral intake found to have AKI and acute encephalopathy for which she was admitted. Psychiatry was consulted and guiding medical management. Due to mood-induced anorexia and severe malnutrition, cortrak was placed 10/7 and tube feedings started. Urine culture grew aerococcus for which ceftriaxone was started. The patient continues to have severe psychiatric symptoms, predominantly anxiety, and [facility]  11/09/2017: Patient is very anxious and minimally interactive.  Appears edematous with third spacing on exam; will discontinue IV fluid. 11/10/17: Somnolent this am. Sitter at bedside. No acute distress. Awaiting bed placement at psych hospital. 11/11/2017: Amanda Hall is present in the room.  No acute events overnight.  Patient is more interactive today.  She denies any chest pain dyspnea or palpitations.  Denies any cough or nausea.  Cortrack is still in place. 11/12/2017: At bedside with sitter present.  Anxious and minimally interactive.  She has no new complaints.  No acute events overnight.  Lab studies and physical exam essentially unremarkable. 11/13/2017: Awaiting placement at psych hospital. 11/14/17: Waiting for placement. 11/15/17: Abnormal ekg. 2D echo obtained and revealed suspected cardiac amyloidosis. Consulted cardiology.  11/16/2017: She is anxious today.  Cardiology followed for suspected cardiac amyloidosis. 11/17/2017: Cardiology signed off and will see outpatient for cardiac MRI or nuclear scanning once her anxiety is improved.  We consulted psychiatry to reassess severe anxiety with some adjustment in her psych medications.  11/18/2017:  Patient seen and examined at her bedside with sitter present.  No acute events overnight.  Patient still very anxious.  Psychiatry following as needed.  Awaiting placement to psych hospital.  Assessment/Plan: Principal Problem:   MDD (major depressive disorder), recurrent severe, without psychosis (HCC) Active Problems:   MDD (major depressive disorder)   Anorexia   Dysphagia   Severe malnutrition (HCC)   Anxiety  Persistent severe depression and anxiety Psych medications adjusted [facility] One-to-one sitter at bedside for patient's own safety Currently on Klonopin 0.5 mg 3 times daily as needed for anxiety, Luvox 150 mg nightly, Remeron 45 mg nightly, Zyprexa 15 mg sublingual nightly, thiamine 100 mg daily Psychiatry reconsulted to reassess severe anxiety Medication adjustment as stated above Psychiatry will be available as needed Poor prognosis Palliative care team consulted  Suspected cardiac amyloidosis 2D echo done on 11/15/2017 with suspicion for cardiac amyloidosis Cardiology consulted and signed off on 11/17/2017 Will see outpatient once anxiety is improved for possible cardiac MRI or nuclear scanning.  Sinus tachycardia, resolved No clear etiology Continue to monitor closely 12 lead ekg on 11/14/17; independently reviewed revealed inversed t-wave in lateral leads. Monitor on telemetry  Severe protein calorie malnutrition due to anorexia Continue core track Dietitian following Continue to monitor electrolytes Continue appetite stimulants Continue free water flushes 300 cc q6h Unremarkable studies of phosphorus, magnesium, and BMP  Resolved AKI Hold off IV fluid to avoid fluid overload C/w free water flushes 300 cc 4 times daily  Iron deficiency anemia  Low saturation ratios on 09/08/2017 Continue ferrous sulfate supplement No sign of overt bleeding  Physical debility/ambulatory dysfunction Worked with OT no further  recommendations   Code Status: Full code  Family Communication: None at bedside  Disposition Plan: Discharge to psych hospital when  bed placement is available   Consultants:  Psychiatry  Cardiology  Procedures:  None  Antimicrobials:  None  DVT prophylaxis: Subcu Lovenox daily   Objective: Vitals:   11/17/17 1946 11/18/17 0339 11/18/17 1014 11/18/17 1605  BP: 136/82 (!) 166/98 113/89 123/61  Pulse: 94 (!) 103 (!) 108 95  Resp: 19 18 18 18   Temp: 98.1 F (36.7 C) 97.8 F (36.6 C)    TempSrc: Oral Oral Oral   SpO2: 98% 97% 96% 100%  Weight: 47.3 kg     Height:        Intake/Output Summary (Last 24 hours) at 11/18/2017 1614 Last data filed at 11/18/2017 1323 Gross per 24 hour  Intake 385 ml  Output 2050 ml  Net -1665 ml   Filed Weights   11/06/17 0500 11/07/17 0500 11/17/17 1946  Weight: 46.6 kg 46.6 kg 47.3 kg    Exam:  . General: 67 y.o. year-old female.  Emaciated and very anxious.  Alert and oriented x3. . Cardiovascular: Regular rate and rhythm with no rubs or gallops.  No JVD or thyromegaly noted. Marland Kitchen Respiratory: Clear to auscultation with no wheezes or rales.  Poor inspiratory effort. . Abdomen: Soft nontender nondistended with normal bowel sounds x4 quadrants. . Musculoskeletal: No edema.  2/4 pulses in all 4 extremities. Marland Kitchen Psychiatry: Mood is appropriate for condition and setting.  Data Reviewed: CBC: Recent Labs  Lab 11/12/17 0712  WBC 7.3  NEUTROABS 4.7  HGB 12.0  HCT 38.6  MCV 93.2  PLT 389   Basic Metabolic Panel: Recent Labs  Lab 11/12/17 0712  NA 140  K 3.9  CL 107  CO2 29  GLUCOSE 147*  BUN 20  CREATININE 0.58  CALCIUM 9.7  MG 2.4  PHOS 3.3   GFR: Estimated Creatinine Clearance: 51.7 mL/min (by C-G formula based on SCr of 0.58 mg/dL). Liver Function Tests: Recent Labs  Lab 11/12/17 0712  AST 19  ALT 21  ALKPHOS 74  BILITOT 0.5  PROT 6.0*  ALBUMIN 2.7*   No results for input(s): LIPASE, AMYLASE in the  last 168 hours. No results for input(s): AMMONIA in the last 168 hours. Coagulation Profile: No results for input(s): INR, PROTIME in the last 168 hours. Cardiac Enzymes: No results for input(s): CKTOTAL, CKMB, CKMBINDEX, TROPONINI in the last 168 hours. BNP (last 3 results) No results for input(s): PROBNP in the last 8760 hours. HbA1C: No results for input(s): HGBA1C in the last 72 hours. CBG: Recent Labs  Lab 11/17/17 1116 11/17/17 1712 11/17/17 1949 11/18/17 0757 11/18/17 1137  GLUCAP 131* 121* 106* 141* 113*   Lipid Profile: No results for input(s): CHOL, HDL, LDLCALC, TRIG, CHOLHDL, LDLDIRECT in the last 72 hours. Thyroid Function Tests: No results for input(s): TSH, T4TOTAL, FREET4, T3FREE, THYROIDAB in the last 72 hours. Anemia Panel: No results for input(s): VITAMINB12, FOLATE, FERRITIN, TIBC, IRON, RETICCTPCT in the last 72 hours. Urine analysis:    Component Value Date/Time   COLORURINE AMBER (A) 10/23/2017 0106   APPEARANCEUR CLOUDY (A) 10/23/2017 0106   LABSPEC 1.009 10/23/2017 0106   PHURINE 9.0 (H) 10/23/2017 0106   GLUCOSEU NEGATIVE 10/23/2017 0106   HGBUR MODERATE (A) 10/23/2017 0106   BILIRUBINUR NEGATIVE 10/23/2017 0106   KETONESUR NEGATIVE 10/23/2017 0106   PROTEINUR NEGATIVE 10/23/2017 0106   UROBILINOGEN 0.2 12/15/2006 1654   NITRITE NEGATIVE 10/23/2017 0106   LEUKOCYTESUR MODERATE (A) 10/23/2017 0106   Sepsis Labs: @LABRCNTIP (procalcitonin:4,lacticidven:4)  )No results found for this or any previous visit (from the  past 240 hour(s)).    Studies: No results found.  Scheduled Meds: . chlorhexidine  15 mL Mouth Rinse BID  . enoxaparin (LOVENOX) injection  30 mg Subcutaneous Q24H  . ferrous sulfate  325 mg Oral Q breakfast  . fluvoxaMINE  150 mg Per Tube QHS  . free water  300 mL Per Tube Q6H  . magnesium hydroxide  30 mL Per Tube Daily  . magnesium oxide  400 mg Per Tube BID  . mouth rinse  15 mL Mouth Rinse q12n4p  . megestrol  400 mg  Per Tube Daily  . mirtazapine  45 mg Per Tube QHS  . nystatin  5 mL Oral QID  . OLANZapine zydis  15 mg Sublingual QHS  . phosphorus  500 mg Oral TID WC  . polyethylene glycol  17 g Per Tube BID  . sennosides  5 mL Per Tube QHS  . simvastatin  10 mg Per Tube q1800  . sodium chloride flush  3 mL Intravenous Q12H  . thiamine  100 mg Oral Daily  . vitamin C  250 mg Per Tube BID    Continuous Infusions: . feeding supplement (OSMOLITE 1.2 CAL) 1,000 mL (11/18/17 0316)     LOS: 44 days     Darlin Drop, MD [facility] Pager [phone]  If 7PM-7AM, please contact night-coverage [email or web address] Password [email or web address] 11/18/2017, 4:14 PM

## 2017-11-19 LAB — GLUCOSE, CAPILLARY
Glucose-Capillary: 137 mg/dL — ABNORMAL HIGH (ref 70–99)
Glucose-Capillary: 146 mg/dL — ABNORMAL HIGH (ref 70–99)
Glucose-Capillary: 122 mg/dL — ABNORMAL HIGH (ref 70–99)
Glucose-Capillary: 117 mg/dL — ABNORMAL HIGH (ref 70–99)

## 2017-11-19 NOTE — Progress Notes (Signed)
PROGRESS NOTE  Amanda Hall WJX:[id number] DOB: 1950/12/04 DOA: 10/05/2017 PCP: [name], MD  HPI/Recap of past 24 hours: 67 y.o.femalewith a history of depression and anxiety with recent psychiatric hospitalization who presented 9/25 with confusion, agitation, refusal to take medications or per oral intake found to have AKI and acute encephalopathy for which she was admitted. Psychiatry was consulted and guiding medical management. Due to mood-induced anorexia and severe malnutrition, cortrak was placed 67 and tube feedings started. Urine culture grew aerococcus for which ceftriaxone was started. The patient continues to have severe psychiatric symptoms, predominantly anxiety, and is [facility].  11/09/2017: Patient is very anxious and minimally interactive.  Appears edematous with third spacing on exam; will discontinue IV fluid. 11/10/17: Somnolent this am. Sitter at bedside. No acute distress. Awaiting bed placement at psych hospital. 11/11/2017: Amanda Hall is present in the room.  No acute events overnight.  Patient is more interactive today.  She denies any chest pain dyspnea or palpitations.  Denies any cough or nausea.  Cortrack is still in place. 11/12/2017: At bedside with sitter present.  Anxious and minimally interactive.  She has no new complaints.  No acute events overnight.  Lab studies and physical exam essentially unremarkable. 11/13/2017: Awaiting placement at psych hospital. 11/14/17: Waiting for placement. 11/15/17: Abnormal ekg. 2D echo obtained and revealed suspected cardiac amyloidosis. Consulted cardiology.  11/16/2017: She is anxious today.  Cardiology followed for suspected cardiac amyloidosis. 11/17/2017: Cardiology signed off and will see outpatient for cardiac MRI or nuclear scanning once her anxiety is improved.  We consulted psychiatry to reassess severe anxiety with some adjustment in her psych medications.  11/18/2017:  Patient seen and examined at her bedside with sitter present.  No acute events overnight.  Patient still very anxious.  Psychiatry following as needed.  Awaiting placement to psych hospital.  11/19/2017: she is tolerating tube feeds, on room air, no fever, no edema, intermittent tremor due to anxiety, sitter in room. Awaiting palliative care consult and psych placement  Assessment/Plan: Principal Problem:   MDD (major depressive disorder), recurrent severe, without psychosis (HCC) Active Problems:   MDD (major depressive disorder)   Anorexia   Dysphagia   Severe malnutrition (HCC)   Anxiety  Persistent severe depression and anxiety Psych medications adjusted [facility] One-to-one sitter at bedside for patient's own safety Currently on Klonopin 0.5 mg 3 times daily as needed for anxiety, Luvox 150 mg nightly, Remeron 45 mg nightly, Zyprexa 15 mg sublingual nightly, thiamine 100 mg daily Psychiatry reconsulted to reassess severe anxiety Medication adjustment as stated above Psychiatry will be available as needed Poor prognosis Palliative care team consulted  Suspected cardiac amyloidosis 2D echo done on 11/15/2017 with suspicion for cardiac amyloidosis Cardiology consulted and signed off on 11/17/2017 Will see outpatient once anxiety is improved for possible cardiac MRI or nuclear scanning.  Sinus tachycardia, resolved No clear etiology Continue to monitor closely 12 lead ekg on 11/14/17; independently reviewed revealed inversed t-wave in lateral leads. Monitor on telemetry  Severe protein calorie malnutrition due to anorexia Continue core track Dietitian following Continue to monitor electrolytes Continue appetite stimulants Continue free water flushes 300 cc q6h Unremarkable studies of phosphorus, magnesium, and BMP Last am lab was on 11/2, will repeat bmp tomorrow on 11/10  Resolved AKI Hold off IV fluid to avoid fluid  overload C/w free water flushes 300 cc 4 times daily  Iron deficiency anemia  Low saturation ratios  on 09/08/2017 Continue ferrous sulfate supplement No sign of overt bleeding  Physical debility/ambulatory dysfunction Worked with OT no further recommendations   Code Status: Full code  Family Communication: None at bedside  Disposition Plan: Discharge to psych hospital when bed placement is available   Consultants:  Psychiatry  Cardiology  Procedures:  None  Antimicrobials:  None  DVT prophylaxis: Subcu Lovenox daily   Objective: Vitals:   11/18/17 1014 11/18/17 1605 11/18/17 2111 11/19/17 0431  BP: 113/89 123/61 118/65 119/65  Pulse: (!) 108 95 91 (!) 101  Resp: 18 18 20 20   Temp:   97.6 F (36.4 C) 98.3 F (36.8 C)  TempSrc: Oral  Oral Oral  SpO2: 96% 100% 98% 98%  Weight:      Height:        Intake/Output Summary (Last 24 hours) at 11/19/2017 0813 Last data filed at 11/19/2017 0600 Gross per 24 hour  Intake 3148.67 ml  Output 2450 ml  Net 698.67 ml   Filed Weights   11/06/17 0500 11/07/17 0500 11/17/17 1946  Weight: 46.6 kg 46.6 kg 47.3 kg    Exam:  . General: 67 y.o. year-old female.  Emaciated and very anxious.  Alert and oriented x3. . Cardiovascular: Regular rate and rhythm with no rubs or gallops.  No JVD or thyromegaly noted. Marland Kitchen Respiratory: Clear to auscultation with no wheezes or rales.  Poor inspiratory effort. . Abdomen: Soft nontender nondistended with normal bowel sounds x4 quadrants. . Musculoskeletal: No edema.  2/4 pulses in all 4 extremities. Marland Kitchen Psychiatry: Mood is appropriate for condition and setting.  Data Reviewed: CBC: No results for input(s): WBC, NEUTROABS, HGB, HCT, MCV, PLT in the last 168 hours. Basic Metabolic Panel: No results for input(s): NA, K, CL, CO2, GLUCOSE, BUN, CREATININE, CALCIUM, MG, PHOS in the last 168 hours. GFR: Estimated Creatinine Clearance: 51.7 mL/min (by C-G formula based on SCr of 0.58  mg/dL). Liver Function Tests: No results for input(s): AST, ALT, ALKPHOS, BILITOT, PROT, ALBUMIN in the last 168 hours. No results for input(s): LIPASE, AMYLASE in the last 168 hours. No results for input(s): AMMONIA in the last 168 hours. Coagulation Profile: No results for input(s): INR, PROTIME in the last 168 hours. Cardiac Enzymes: No results for input(s): CKTOTAL, CKMB, CKMBINDEX, TROPONINI in the last 168 hours. BNP (last 3 results) No results for input(s): PROBNP in the last 8760 hours. HbA1C: No results for input(s): HGBA1C in the last 72 hours. CBG: Recent Labs  Lab 11/17/17 1949 11/18/17 0757 11/18/17 1137 11/18/17 1604 11/18/17 2110  GLUCAP 106* 141* 113* 116* 125*   Lipid Profile: No results for input(s): CHOL, HDL, LDLCALC, TRIG, CHOLHDL, LDLDIRECT in the last 72 hours. Thyroid Function Tests: No results for input(s): TSH, T4TOTAL, FREET4, T3FREE, THYROIDAB in the last 72 hours. Anemia Panel: No results for input(s): VITAMINB12, FOLATE, FERRITIN, TIBC, IRON, RETICCTPCT in the last 72 hours. Urine analysis:    Component Value Date/Time   COLORURINE AMBER (A) 10/23/2017 0106   APPEARANCEUR CLOUDY (A) 10/23/2017 0106   LABSPEC 1.009 10/23/2017 0106   PHURINE 9.0 (H) 10/23/2017 0106   GLUCOSEU NEGATIVE 10/23/2017 0106   HGBUR MODERATE (A) 10/23/2017 0106   BILIRUBINUR NEGATIVE 10/23/2017 0106   KETONESUR NEGATIVE 10/23/2017 0106   PROTEINUR NEGATIVE 10/23/2017 0106   UROBILINOGEN 0.2 12/15/2006 1654   NITRITE NEGATIVE 10/23/2017 0106   LEUKOCYTESUR MODERATE (A) 10/23/2017 0106   Sepsis Labs: @LABRCNTIP (procalcitonin:4,lacticidven:4)  )No results found for this or any previous visit (from the past 240  hour(s)).    Studies: No results found.  Scheduled Meds: . chlorhexidine  15 mL Mouth Rinse BID  . enoxaparin (LOVENOX) injection  30 mg Subcutaneous Q24H  . ferrous sulfate  325 mg Oral Q breakfast  . fluvoxaMINE  150 mg Per Tube QHS  . free water   300 mL Per Tube Q6H  . magnesium hydroxide  30 mL Per Tube Daily  . magnesium oxide  400 mg Per Tube BID  . mouth rinse  15 mL Mouth Rinse q12n4p  . megestrol  400 mg Per Tube Daily  . mirtazapine  45 mg Per Tube QHS  . nystatin  5 mL Oral QID  . OLANZapine zydis  15 mg Sublingual QHS  . phosphorus  500 mg Oral TID WC  . polyethylene glycol  17 g Per Tube BID  . sennosides  5 mL Per Tube QHS  . simvastatin  10 mg Per Tube q1800  . sodium chloride flush  3 mL Intravenous Q12H  . thiamine  100 mg Oral Daily  . vitamin C  250 mg Per Tube BID    Continuous Infusions: . feeding supplement (OSMOLITE 1.2 CAL) 1,000 mL (11/18/17 2300)     LOS: 45 days     Albertine Grates, MD PhD [facility] Pager [phone]  If 7PM-7AM, please contact night-coverage [email or web address] Password [facility] 11/19/2017, 8:13 AM

## 2017-11-20 LAB — BASIC METABOLIC PANEL
Anion gap: 5 (ref 5–15)
BUN: 36 mg/dL — ABNORMAL HIGH (ref 8–23)
CALCIUM: 10.2 mg/dL (ref 8.9–10.3)
CO2: 34 mmol/L — AB (ref 22–32)
Chloride: 101 mmol/L (ref 98–111)
Creatinine, Ser: 0.83 mg/dL (ref 0.44–1.00)
GFR calc Af Amer: >60 mL/min (ref >60)
GLUCOSE: 124 mg/dL — AB (ref 70–99)
Potassium: 3.5 mmol/L (ref 3.5–5.1)
Sodium: 140 mmol/L (ref 135–145)

## 2017-11-20 LAB — GLUCOSE, CAPILLARY
Glucose-Capillary: 121 mg/dL — ABNORMAL HIGH (ref 70–99)
Glucose-Capillary: 125 mg/dL — ABNORMAL HIGH (ref 70–99)
Glucose-Capillary: 104 mg/dL — ABNORMAL HIGH (ref 70–99)

## 2017-11-20 NOTE — Plan of Care (Signed)
  Problem: Activity: Goal: Risk for activity intolerance will decrease Outcome: Progressing   Problem: Spiritual Needs Goal: Ability to function at adequate level Outcome: Progressing

## 2017-11-20 NOTE — Progress Notes (Signed)
PROGRESS NOTE  Amanda Hall ZOX:[id number] DOB: April 09, 1950 DOA: 10/05/2017 PCP: [name], MD  HPI/Recap of past 24 hours: 67 y.o.femalewith a history of depression and anxiety with recent psychiatric hospitalization who presented 9/25 with confusion, agitation, refusal to take medications or per oral intake found to have AKI and acute encephalopathy for which she was admitted. Psychiatry was consulted and guiding medical management. Due to mood-induced anorexia and severe malnutrition, cortrak was placed 10/7 and tube feedings started. Urine culture grew aerococcus for which ceftriaxone was started. The patient continues to have severe psychiatric symptoms, predominantly anxiety, and [facility]  11/09/2017: Patient is very anxious and minimally interactive.  Appears edematous with third spacing on exam; will discontinue IV fluid. 11/10/17: Somnolent this am. Sitter at bedside. No acute distress. Awaiting bed placement at psych hospital. 11/11/2017: [name] is present in the room.  No acute events overnight.  Patient is more interactive today.  She denies any chest pain dyspnea or palpitations.  Denies any cough or nausea.  Cortrack is still in place. 11/12/2017: At bedside with sitter present.  Anxious and minimally interactive.  She has no new complaints.  No acute events overnight.  Lab studies and physical exam essentially unremarkable. 11/13/2017: Awaiting placement at psych hospital. 11/14/17: Waiting for placement. 11/15/17: Abnormal ekg. 2D echo obtained and revealed suspected cardiac amyloidosis. Consulted cardiology.  11/16/2017: She is anxious today.  Cardiology followed for suspected cardiac amyloidosis. 11/17/2017: Cardiology signed off and will see outpatient for cardiac MRI or nuclear scanning once her anxiety is improved.  We consulted psychiatry to reassess severe anxiety with some adjustment in her psych medications.  11/18/2017:  Patient seen and examined at her bedside with sitter present.  No acute events overnight.  Patient still very anxious.  Psychiatry following as needed.  Awaiting placement to psych hospital.  11/20/17: No acute events overnight.  Somnolent and in no acute distress.  Chaplain consulted for patient support.  Assessment/Plan: Principal Problem:   MDD (major depressive disorder), recurrent severe, without psychosis (HCC) Active Problems:   MDD (major depressive disorder)   Anorexia   Dysphagia   Severe malnutrition (HCC)   Anxiety  Persistent severe depression and anxiety Psych medications adjusted [facility] One-to-one sitter at bedside for patient's own safety Currently on Klonopin 0.5 mg 3 times daily as needed for anxiety, Luvox 150 mg nightly, Remeron 45 mg nightly, Zyprexa 15 mg sublingual nightly, thiamine 100 mg daily Psychiatry reconsulted to reassess severe anxiety Medication adjustment as stated above Psychiatry will be available as needed Poor prognosis Palliative care team consulted Chaplain consulted  Suspected cardiac amyloidosis 2D echo done on 11/15/2017 with suspicion for cardiac amyloidosis Cardiology consulted and signed off on 11/17/2017 Will see outpatient once anxiety is improved for possible cardiac MRI or nuclear scanning.  Sinus tachycardia, resolved No clear etiology Continue to monitor closely 12 lead ekg on 11/14/17; independently reviewed revealed inversed t-wave in lateral leads. Monitor on telemetry  Severe protein calorie malnutrition due to anorexia Continue core track Dietitian following Continue to monitor electrolytes Continue appetite stimulants Continue free water flushes 300 cc q6h Unremarkable studies of phosphorus, magnesium, and BMP  Resolved AKI Hold off IV fluid to avoid fluid overload C/w free water flushes 300 cc 4 times daily  Iron deficiency anemia  Low saturation ratios on  09/08/2017 Continue ferrous sulfate supplement No sign of overt bleeding  Physical debility/ambulatory dysfunction Worked with OT no further  recommendations   Code Status: Full code  Family Communication: None at bedside  Disposition Plan: Discharge to psych hospital when bed placement is available   Consultants:  Psychiatry  Cardiology  Procedures:  None  Antimicrobials:  None  DVT prophylaxis: Subcu Lovenox daily   Objective: Vitals:   11/19/17 1751 11/19/17 2155 11/20/17 0619 11/20/17 1023  BP: 124/64 128/65 128/63 (!) 148/79  Pulse: 89 83 84 (!) 103  Resp: 18 16 13 15   Temp: 97.7 F (36.5 C) 98.4 F (36.9 C) 97.9 F (36.6 C) 98.4 F (36.9 C)  TempSrc: Oral  Axillary Oral  SpO2: 97% 96% 97% 97%  Weight:  47.4 kg    Height:        Intake/Output Summary (Last 24 hours) at 11/20/2017 1029 Last data filed at 11/20/2017 0105 Gross per 24 hour  Intake 1490 ml  Output 1550 ml  Net -60 ml   Filed Weights   11/07/17 0500 11/17/17 1946 11/19/17 2155  Weight: 46.6 kg 47.3 kg 47.4 kg    Exam:  . General: 67 y.o. year-old female.  Emancipated somnolent in no acute distress. . Cardiovascular: Rate and rhythm with no rubs or gallops.  No JVD or thyromegaly noted. Marland Kitchen Respiratory: Clear to auscultation with no wheezes or rales.  Poor inspiratory effort. . Abdomen: Soft nontender nondistended with normal bowel sounds x4 quadrants. . Musculoskeletal: No edema.  2/4 pulses in all 4 extremities. Marland Kitchen Psychiatry: Mood is appropriate for condition and setting.  Data Reviewed: CBC: No results for input(s): WBC, NEUTROABS, HGB, HCT, MCV, PLT in the last 168 hours. Basic Metabolic Panel: Recent Labs  Lab 11/20/17 0554  NA 140  K 3.5  CL 101  CO2 34*  GLUCOSE 124*  BUN 36*  CREATININE 0.83  CALCIUM 10.2   GFR: Estimated Creatinine Clearance: 49.9 mL/min (by C-G formula based on SCr of 0.83 mg/dL). Liver Function Tests: No results for input(s): AST, ALT,  ALKPHOS, BILITOT, PROT, ALBUMIN in the last 168 hours. No results for input(s): LIPASE, AMYLASE in the last 168 hours. No results for input(s): AMMONIA in the last 168 hours. Coagulation Profile: No results for input(s): INR, PROTIME in the last 168 hours. Cardiac Enzymes: No results for input(s): CKTOTAL, CKMB, CKMBINDEX, TROPONINI in the last 168 hours. BNP (last 3 results) No results for input(s): PROBNP in the last 8760 hours. HbA1C: No results for input(s): HGBA1C in the last 72 hours. CBG: Recent Labs  Lab 11/19/17 0849 11/19/17 1400 11/19/17 1754 11/19/17 2154 11/20/17 1018  GLUCAP 137* 146* 122* 117* 121*   Lipid Profile: No results for input(s): CHOL, HDL, LDLCALC, TRIG, CHOLHDL, LDLDIRECT in the last 72 hours. Thyroid Function Tests: No results for input(s): TSH, T4TOTAL, FREET4, T3FREE, THYROIDAB in the last 72 hours. Anemia Panel: No results for input(s): VITAMINB12, FOLATE, FERRITIN, TIBC, IRON, RETICCTPCT in the last 72 hours. Urine analysis:    Component Value Date/Time   COLORURINE AMBER (A) 10/23/2017 0106   APPEARANCEUR CLOUDY (A) 10/23/2017 0106   LABSPEC 1.009 10/23/2017 0106   PHURINE 9.0 (H) 10/23/2017 0106   GLUCOSEU NEGATIVE 10/23/2017 0106   HGBUR MODERATE (A) 10/23/2017 0106   BILIRUBINUR NEGATIVE 10/23/2017 0106   KETONESUR NEGATIVE 10/23/2017 0106   PROTEINUR NEGATIVE 10/23/2017 0106   UROBILINOGEN 0.2 12/15/2006 1654   NITRITE NEGATIVE 10/23/2017 0106   LEUKOCYTESUR MODERATE (A) 10/23/2017 0106   Sepsis Labs: @LABRCNTIP (procalcitonin:4,lacticidven:4)  )No results found for this or any previous visit (from the past 240 hour(s)).  Studies: No results found.  Scheduled Meds: . chlorhexidine  15 mL Mouth Rinse BID  . enoxaparin (LOVENOX) injection  30 mg Subcutaneous Q24H  . ferrous sulfate  325 mg Oral Q breakfast  . fluvoxaMINE  150 mg Per Tube QHS  . free water  300 mL Per Tube Q6H  . magnesium hydroxide  30 mL Per Tube Daily  .  magnesium oxide  400 mg Per Tube BID  . mouth rinse  15 mL Mouth Rinse q12n4p  . megestrol  400 mg Per Tube Daily  . mirtazapine  45 mg Per Tube QHS  . nystatin  5 mL Oral QID  . OLANZapine zydis  15 mg Sublingual QHS  . phosphorus  500 mg Oral TID WC  . polyethylene glycol  17 g Per Tube BID  . sennosides  5 mL Per Tube QHS  . simvastatin  10 mg Per Tube q1800  . sodium chloride flush  3 mL Intravenous Q12H  . thiamine  100 mg Oral Daily  . vitamin C  250 mg Per Tube BID    Continuous Infusions: . feeding supplement (OSMOLITE 1.2 CAL) 1,000 mL (11/18/17 2300)     LOS: 46 days     Darlin Drop, MD [facility] Pager [phone]  If 7PM-7AM, please contact night-coverage [email or web address] Password [email or web address] 11/20/2017, 10:29 AM

## 2017-11-21 DIAGNOSIS — R630 Anorexia: Secondary | ICD-10-CM

## 2017-11-21 LAB — GLUCOSE, CAPILLARY
GLUCOSE-CAPILLARY: 139 mg/dL — AB (ref 70–99)
GLUCOSE-CAPILLARY: 109 mg/dL — AB (ref 70–99)
Glucose-Capillary: 130 mg/dL — ABNORMAL HIGH (ref 70–99)
Glucose-Capillary: 117 mg/dL — ABNORMAL HIGH (ref 70–99)

## 2017-11-21 NOTE — Clinical Social Work Note (Signed)
IV-C paperwork updated today and placed in patient chart. CSW will continue to follow and check-in with [facility] to assure patient remains on their wait list.  Genelle Bal, MSW, LCSW Licensed Clinical Social Worker Clinical Social Work Department [employer] [phone]

## 2017-11-21 NOTE — Progress Notes (Signed)
PROGRESS NOTE  Amanda Hall WUJ:[id number] DOB: January 30, 1950 DOA: 10/05/2017 PCP: [name], MD  HPI/Recap of past 24 hours: 67 y.o.femalewith a history of depression and anxiety with recent psychiatric hospitalization who presented 9/25 with confusion, agitation, refusal to take medications or per oral intake found to have AKI and acute encephalopathy for which she was admitted. Psychiatry was consulted and guiding medical management. Due to mood-induced anorexia and severe malnutrition, cortrak was placed 10/7 and tube feedings started. Urine culture grew aerococcus for which ceftriaxone was started. The patient continues to have severe psychiatric symptoms, predominantly anxiety, and is [facility].  11/09/2017: Patient is very anxious and minimally interactive.  Appears edematous with third spacing on exam; will discontinue IV fluid. 11/10/17: Somnolent this am. Sitter at bedside. No acute distress. Awaiting bed placement at psych hospital. 11/11/2017: [name] is present in the room.  No acute events overnight.  Patient is more interactive today.  She denies any chest pain dyspnea or palpitations.  Denies any cough or nausea.  Cortrack is still in place. 11/12/2017: At bedside with sitter present.  Anxious and minimally interactive.  She has no new complaints.  No acute events overnight.  Lab studies and physical exam essentially unremarkable. 11/13/2017: Awaiting placement at psych hospital. 11/14/17: Waiting for placement. 11/15/17: Abnormal ekg. 2D echo obtained and revealed suspected cardiac amyloidosis. Consulted cardiology.  11/16/2017: She is anxious today.  Cardiology followed for suspected cardiac amyloidosis. 11/17/2017: Cardiology signed off and will see outpatient for cardiac MRI or nuclear scanning once her anxiety is improved.  We consulted psychiatry to reassess severe anxiety with some adjustment in her psych medications.  11/18/2017:  Patient seen and examined at her bedside with sitter present.  No acute events overnight.  Patient still very anxious.  Psychiatry following as needed.  Awaiting placement to psych hospital.  11/20/17: No acute events overnight.  Somnolent and in no acute distress.  Chaplain consulted for patient support.  11/21/17: Patient seen and examined at bedside.  Sitter present.  No acute distress.  Chaplain will see on Tuesday.  Assessment/Plan: Principal Problem:   MDD (major depressive disorder), recurrent severe, without psychosis (HCC) Active Problems:   MDD (major depressive disorder)   Anorexia   Dysphagia   Severe malnutrition (HCC)   Anxiety  Persistent severe depression and anxiety Psych medications adjusted [facility] One-to-one sitter at bedside for patient's own safety Currently on Klonopin 0.5 mg 3 times daily as needed for anxiety, Luvox 150 mg nightly, Remeron 45 mg nightly, Zyprexa 15 mg sublingual nightly, thiamine 100 mg daily Psychiatry reconsulted to reassess severe anxiety Medication adjustment as stated above Psychiatry will be available as needed Poor prognosis Palliative care team consulted Chaplain consulted will see tomorrow  Suspected cardiac amyloidosis 2D echo done on 11/15/2017 with suspicion for cardiac amyloidosis Cardiology consulted and signed off on 11/17/2017 Will see outpatient once anxiety is improved for possible cardiac MRI or nuclear scanning.  Sinus tachycardia, resolved No clear etiology Continue to monitor closely 12 lead ekg on 11/14/17; independently reviewed revealed inversed t-wave in lateral leads. Monitor on telemetry  Severe protein calorie malnutrition due to anorexia Continue core track Dietitian following Continue to monitor electrolytes Continue appetite stimulants Continue free water flushes 300 cc q6h Unremarkable studies of phosphorus, magnesium, and BMP  Resolved AKI Hold off IV  fluid to avoid fluid overload C/w free water flushes 300 cc 4 times daily  Iron deficiency anemia  Low saturation ratios on 09/08/2017 Continue ferrous sulfate supplement No sign of overt bleeding  Physical debility/ambulatory dysfunction Worked with OT no further recommendations   Code Status: Full code  Family Communication: None at bedside  Disposition Plan: Discharge to psych hospital when bed placement is available   Consultants:  Psychiatry  Cardiology  Procedures:  None  Antimicrobials:  None  DVT prophylaxis: Subcu Lovenox daily   Objective: Vitals:   11/20/17 1023 11/20/17 1756 11/20/17 2132 11/21/17 0533  BP: (!) 148/79 132/69 126/71 (!) 122/59  Pulse: (!) 103 96 91 89  Resp: 15 20 13 15   Temp: 98.4 F (36.9 C) 98 F (36.7 C) 98.4 F (36.9 C) 98.2 F (36.8 C)  TempSrc: Oral Oral  Oral  SpO2: 97% 99% 99% 99%  Weight:      Height:        Intake/Output Summary (Last 24 hours) at 11/21/2017 1314 Last data filed at 11/21/2017 1130 Gross per 24 hour  Intake 1418.67 ml  Output 4450 ml  Net -3031.33 ml   Filed Weights   11/07/17 0500 11/17/17 1946 11/19/17 2155  Weight: 46.6 kg 47.3 kg 47.4 kg    Exam:  . General: 67 y.o. year-old female.  Emaciated, in no acute distress.  Alert and oriented x3. . Cardiovascular: Regular rate and rhythm with no rubs or gallops.  No JVD or thyromegaly noted. Marland Kitchen Respiratory: Clear to auscultation with no wheezes or rales.  Poor inspiratory effort. . Abdomen: Soft nontender nondistended with normal bowel sounds x4 quadrants. . Musculoskeletal: No edema.  2/4 pulses in all 4 extremities. Marland Kitchen Psychiatry: Mood is appropriate for condition and setting.  Data Reviewed: CBC: No results for input(s): WBC, NEUTROABS, HGB, HCT, MCV, PLT in the last 168 hours. Basic Metabolic Panel: Recent Labs  Lab 11/20/17 0554  NA 140  K 3.5  CL 101  CO2 34*  GLUCOSE 124*  BUN 36*  CREATININE 0.83  CALCIUM 10.2    GFR: Estimated Creatinine Clearance: 49.9 mL/min (by C-G formula based on SCr of 0.83 mg/dL). Liver Function Tests: No results for input(s): AST, ALT, ALKPHOS, BILITOT, PROT, ALBUMIN in the last 168 hours. No results for input(s): LIPASE, AMYLASE in the last 168 hours. No results for input(s): AMMONIA in the last 168 hours. Coagulation Profile: No results for input(s): INR, PROTIME in the last 168 hours. Cardiac Enzymes: No results for input(s): CKTOTAL, CKMB, CKMBINDEX, TROPONINI in the last 168 hours. BNP (last 3 results) No results for input(s): PROBNP in the last 8760 hours. HbA1C: No results for input(s): HGBA1C in the last 72 hours. CBG: Recent Labs  Lab 11/20/17 1018 11/20/17 1751 11/20/17 2132 11/21/17 0733 11/21/17 1126  GLUCAP 121* 125* 104* 139* 130*   Lipid Profile: No results for input(s): CHOL, HDL, LDLCALC, TRIG, CHOLHDL, LDLDIRECT in the last 72 hours. Thyroid Function Tests: No results for input(s): TSH, T4TOTAL, FREET4, T3FREE, THYROIDAB in the last 72 hours. Anemia Panel: No results for input(s): VITAMINB12, FOLATE, FERRITIN, TIBC, IRON, RETICCTPCT in the last 72 hours. Urine analysis:    Component Value Date/Time   COLORURINE AMBER (A) 10/23/2017 0106   APPEARANCEUR CLOUDY (A) 10/23/2017 0106   LABSPEC 1.009 10/23/2017 0106   PHURINE 9.0 (H) 10/23/2017 0106   GLUCOSEU NEGATIVE 10/23/2017 0106   HGBUR MODERATE (A) 10/23/2017 0106   BILIRUBINUR NEGATIVE 10/23/2017 0106   KETONESUR NEGATIVE 10/23/2017 0106   PROTEINUR NEGATIVE 10/23/2017 0106   UROBILINOGEN 0.2 12/15/2006 1654   NITRITE NEGATIVE 10/23/2017 0106  LEUKOCYTESUR MODERATE (A) 10/23/2017 0106   Sepsis Labs: @LABRCNTIP (procalcitonin:4,lacticidven:4)  )No results found for this or any previous visit (from the past 240 hour(s)).    Studies: No results found.  Scheduled Meds: . chlorhexidine  15 mL Mouth Rinse BID  . enoxaparin (LOVENOX) injection  30 mg Subcutaneous Q24H  .  ferrous sulfate  325 mg Oral Q breakfast  . fluvoxaMINE  150 mg Per Tube QHS  . free water  300 mL Per Tube Q6H  . magnesium hydroxide  30 mL Per Tube Daily  . magnesium oxide  400 mg Per Tube BID  . mouth rinse  15 mL Mouth Rinse q12n4p  . megestrol  400 mg Per Tube Daily  . mirtazapine  45 mg Per Tube QHS  . nystatin  5 mL Oral QID  . OLANZapine zydis  15 mg Sublingual QHS  . phosphorus  500 mg Oral TID WC  . polyethylene glycol  17 g Per Tube BID  . sennosides  5 mL Per Tube QHS  . simvastatin  10 mg Per Tube q1800  . sodium chloride flush  3 mL Intravenous Q12H  . thiamine  100 mg Oral Daily  . vitamin C  250 mg Per Tube BID    Continuous Infusions: . feeding supplement (OSMOLITE 1.2 CAL) 1,000 mL (11/21/17 0852)     LOS: 47 days     Darlin Drop, MD [facility] Pager [phone]  If 7PM-7AM, please contact night-coverage [email or web address] Password [email or web address] 11/21/2017, 1:14 PM

## 2017-11-21 NOTE — Progress Notes (Signed)
   11/21/17 1200  Clinical Encounter Type  Visited With Health care provider  Visit Type Initial  Referral From Palliative care team  Consult/Referral To Chaplain  The chaplain followed up on Pt. spiritual care consult as part of PMT with the Pt. RN.  The chaplain and the RN agreed the chaplain's spiritual presence without time constraints may be best for the Pt. The chaplain will return for a spiritual care visit with Pt. Tuesday morning.

## 2017-11-22 DIAGNOSIS — L8990 Pressure ulcer of unspecified site, unspecified stage: Secondary | ICD-10-CM

## 2017-11-22 LAB — GLUCOSE, CAPILLARY
GLUCOSE-CAPILLARY: 118 mg/dL — AB (ref 70–99)
GLUCOSE-CAPILLARY: 104 mg/dL — AB (ref 70–99)
GLUCOSE-CAPILLARY: 110 mg/dL — AB (ref 70–99)
Glucose-Capillary: 135 mg/dL — ABNORMAL HIGH (ref 70–99)

## 2017-11-22 NOTE — Progress Notes (Signed)
   11/22/17 0917  Clinical Encounter Type  Visited With Patient;Other (Comment) Psychiatrist(Sitter)  Visit Type Follow-up  Referral From Physician  Consult/Referral To Chaplain  The chaplain followed up on the consult request for Pt. spiritual care.  At the time of the visit, the Pt. was sleeping peacefully.  After conversation with the sitter, the chaplain decided to return when the RN communicates the Pt. is awake.  The chaplain left her pager # with the unit secretary-Yolanda.

## 2017-11-22 NOTE — Progress Notes (Signed)
Occupational Therapy Treatment Patient Details Name: Amanda Hall MRN: [id number] DOB: 09/26/1950 Today's Date: 11/22/2017    History of present illness Pt is a 67 y/o female admitted secondary to major depressive disorder. Pt with increased confusion and agitation prior to admission. Found to have AKI and acute encephalopathy. CT negative for acute abnormality. PMH includes anxiety and depression.    OT comments  Pt with limited participation at this time but did complete bed level adl task supine. MD and RN notified of need for air mattress at this time. Pt becoming easily agitated by staff communication regarding care in the room to coordinate safe transfers. Pt making statements like "stop talking". Pt prefers minimal verbalizations to her and a quiet environment this session.   Follow Up Recommendations  [facility]    Equipment Recommendations  Hospital bed;Other (comment)(air mattress)    Recommendations for Other Services      Precautions / Restrictions Precautions Precautions: Fall Precaution CommentsTEFL teacher, cortrack tube Restrictions Weight Bearing Restrictions: No       Mobility Bed Mobility Overal bed mobility: Needs Assistance Bed Mobility: Rolling Rolling: Mod assist         General bed mobility comments: pt rolling to the L with min (A) but requires MOD (A) to the right. pt reluctant to engage initially. Pt must repeat statements at times due to slurred words  Transfers                 General transfer comment: declined EOB / declined OOB. pt states "i dont want to walk"    Balance                                           ADL either performed or assessed with clinical judgement   ADL Overall ADL's : Needs assistance/impaired   Eating/Feeding Details (indicate cue type and reason): pt with food present but declined when offered this session         Lower Body Bathing: Total  assistance                         General ADL Comments: Pt limited participation but agreeable once verbalized the need for hygiene. pt was cold and the source was likely due to poor fit of the purewick. pt noted to have wound at sacrum MD and RN notified     Vision       Perception     Praxis      Cognition Arousal/Alertness: Awake/alert Behavior During Therapy: Flat affect Overall Cognitive Status: No family/caregiver present to determine baseline cognitive functioning                                 General Comments: pt asking for staff not to talk at times, pt refusing initially without even understanding why staff have arrived. pt agreeable to bed level care         Exercises     Shoulder Instructions       General Comments sitter present. Purewick is poor fit with body habitus. pt with wound at this time. recommend air mattress with rotation ( frequent change of position) and wound care consult     Pertinent Vitals/ Pain       Pain Assessment: No/denies  pain  Home Living                                          Prior Functioning/Environment              Frequency  Min 2X/week        Progress Toward Goals  OT Goals(current goals can now be found in the care plan section)  Progress towards OT goals: Not progressing toward goals - comment  Acute Rehab OT Goals Patient Stated Goal: did not state OT Goal Formulation: Patient unable to participate in goal setting Time For Goal Achievement: 12/06/17 Potential to Achieve Goals: Fair ADL Goals Pt Will Perform Eating: with min assist;sitting Pt Will Perform Grooming: with mod assist;sitting Pt Will Transfer to Toilet: with mod assist;stand pivot transfer;bedside commode Additional ADL Goal #1: Pt will perform bed mobility with min assistance in preparation for ADL. Additional ADL Goal #2: Pt will participate in 15 minutes of activity.  Plan Discharge plan  remains appropriate    Co-evaluation                 AM-PAC PT "6 Clicks" Daily Activity     Outcome Measure   Help from another person eating meals?: A Lot Help from another person taking care of personal grooming?: A Lot Help from another person toileting, which includes using toliet, bedpan, or urinal?: Total Help from another person bathing (including washing, rinsing, drying)?: Total Help from another person to put on and taking off regular upper body clothing?: Total Help from another person to put on and taking off regular lower body clothing?: Total 6 Click Score: 8    End of Session    OT Visit Diagnosis: Other abnormalities of gait and mobility (R26.89);Muscle weakness (generalized) (M62.81);Pain   Activity Tolerance Patient limited by fatigue   Patient Left in bed;with call bell/phone within reach;with nursing/sitter in room   Nurse Communication Mobility status;Precautions        Time: [id number] OT Time Calculation (min): 23 min  Charges: OT General Charges $OT Visit: 1 Visit OT Treatments $Self Care/Home Management : 8-22 mins   Mateo FlowBrynn Isaid Salvia, OTR/L  Acute Rehabilitation Services Pager: [phone] Office: [phone] .    [name], Jakiah Bienaime B 11/22/2017, 2:01 PM

## 2017-11-22 NOTE — Clinical Social Work Note (Signed)
[phone]) and was informed that patient remains on waiting list. CSW will continue to follow, maintain IV-C paperwork and continue contact with [facility] to confirm that patients is on their waiting list for psychiatric treatment.  Genelle BalVanessa Brittanni Cariker, MSW, LCSW Licensed Clinical Social Worker Clinical Social Work Department [employer] [facility] [phone]

## 2017-11-22 NOTE — Consult Note (Addendum)
Consultation Note Date: 11/22/2017   Patient Name: Amanda Hall  DOB: 05-Nov-1950  MRN: [id number]  Age / Sex: 67 y.o., female  PCP: Marletta Lor, MD Referring Physician: Kayleen Memos, DO  Reason for Consultation: Establishing goals of care  HPI/Patient Profile: 67 y.o. female  with past medical history of depression and anxiety admitted on 10/05/2017 with encephalopathy and AKI.  She has had a prolonged course due to underlying psychiatric issues including placement of cortrack due to anorexia and severe malnutrition.  Currently, she is awaiting bed at inpatient psych facility.  Palliative consulted for [place].    Clinical Assessment and Goals of Care: Palliative care consult received.  Chart reviewed including pertinent labs and imaging.  I met today with Amanda Hall.  She is lying in bed and shakes with anxiety on entering room.  We discussed her care to this point in time as well as plan for transition to higher level of psychiatric care when bed becomes available.  She reports today that she continues to feel anxious and while she does not like cortrack and idea of transfer to psychiatric hospital, she is open to continuation of these as she "wants to try" to get better.  She reports that she relies on her sister for support and she is who helps her to make medical decisions.  SUMMARY OF RECOMMENDATIONS   - Amanda Hall is certainly chronically ill and at risk for decline or development of complications.  At this time, she reports being anxious about transferring to psychiatric facility, but also that she desires to get better.  As it appears that she has a potentially treatable condition with higher level interventions (note ECT mentioned) and expressed desire to pursue treatment, would continue to pursue transfer to psychiatric hospital.   - While she may not possess capacity to make decisions, with her  expressed desire for interventions (even if she lacks capacity) it would seem to me to be in her best interest to continue to pursue transfer if this is a potentially treatable condition and she remains medically stable enough to transfer.  Consideration for any other action (at least while she remains candidate for transfer) would likely be best handled with assistance of ethics.  I am also not sure of legal requirements as she is IVC.  At this time, I do not think that ethics input would be required unless there is consideration to change plan for her to transfer when a bed is available.   - If she reaches a point where she is no longer a candidate for transfer due to medical decline, please reconsult and we would be happy to reevaluate.   Code Status/Advance Care Planning:  Full code   Symptom Management:   Anxiety: per psychiatry  Palliative Prophylaxis:   Delirium Protocol  Additional Recommendations (Limitations, Scope, Preferences):  Full Scope Treatment  Psycho-social/Spiritual:   Desire for further Chaplaincy support:Already ordered  Prognosis:   Unable to determine  Discharge Planning: Psychiatric hospital      Primary Diagnoses:  Present on Admission: . (Resolved) AKI (acute kidney injury) ([place]) . MDD (major depressive disorder) . (Resolved) Hypokalemia . (Resolved) Hypernatremia . (Resolved) SIRS (systemic inflammatory response syndrome) (HCC) . (Resolved) Acute encephalopathy . Anorexia . (Resolved) Acute renal failure (ARF) ([place])   I have reviewed the medical record, interviewed the patient and family, and examined the patient. The following aspects are pertinent.  Past Medical History:  Diagnosis Date  . Allergy   . Anemia   . Anxiety   . Depression   . Hemorrhoids    Social History   Socioeconomic History  . Marital status: Divorced    Spouse name: Not on file  . Number of children: Not on file  . Years of education: Not on file  . Highest  education level: Not on file  Occupational History  . Not on file  Social Needs  . Financial resource strain: Not on file  . Food insecurity:    Worry: Not on file    Inability: Not on file  . Transportation needs:    Medical: Not on file    Non-medical: Not on file  Tobacco Use  . Smoking status: Never Smoker  . Smokeless tobacco: Never Used  Substance and Sexual Activity  . Alcohol use: No  . Drug use: No  . Sexual activity: Not Currently  Lifestyle  . Physical activity:    Days per week: Not on file    Minutes per session: Not on file  . Stress: Not on file  Relationships  . Social connections:    Talks on phone: Not on file    Gets together: Not on file    Attends religious service: Not on file    Active member of club or organization: Not on file    Attends meetings of clubs or organizations: Not on file    Relationship status: Not on file  Other Topics Concern  . Not on file  Social History Narrative  . Not on file   Family History  Problem Relation Age of Onset  . Stroke Mother   . Hypertension Sister   . Cancer Neg Hx        breat and colon hx  . Diabetes Neg Hx        family   Scheduled Meds: . chlorhexidine  15 mL Mouth Rinse BID  . enoxaparin (LOVENOX) injection  30 mg Subcutaneous Q24H  . ferrous sulfate  325 mg Oral Q breakfast  . fluvoxaMINE  150 mg Per Tube QHS  . free water  300 mL Per Tube Q6H  . magnesium hydroxide  30 mL Per Tube Daily  . magnesium oxide  400 mg Per Tube BID  . mouth rinse  15 mL Mouth Rinse q12n4p  . megestrol  400 mg Per Tube Daily  . mirtazapine  45 mg Per Tube QHS  . nystatin  5 mL Oral QID  . OLANZapine zydis  15 mg Sublingual QHS  . phosphorus  500 mg Oral TID WC  . polyethylene glycol  17 g Per Tube BID  . sennosides  5 mL Per Tube QHS  . simvastatin  10 mg Per Tube q1800  . sodium chloride flush  3 mL Intravenous Q12H  . thiamine  100 mg Oral Daily  . vitamin C  250 mg Per Tube BID   Continuous Infusions: .  feeding supplement (OSMOLITE 1.2 CAL) 1,000 mL (11/22/17 0625)   PRN Meds:.acetaminophen **OR** acetaminophen, clonazePAM, labetalol, loperamide, milk and molasses, naphazoline-glycerin, ondansetron **OR**  ondansetron (ZOFRAN) IV Medications Prior to Admission:  Prior to Admission medications   Medication Sig Start Date End Date Taking? Authorizing Provider  fluvoxaMINE (LUVOX) 50 MG tablet Take 3 tablets (150 mg total) by mouth at bedtime. For mood control 09/08/17  Yes Nita Sells, MD  Lactobacillus Rhamnosus, GG, (CULTURELLE) CAPS Take 1 capsule by mouth daily.   Yes [provider]  mirtazapine (REMERON SOL-TAB) 45 MG disintegrating tablet Take 45 mg by mouth at bedtime.   Yes [provider]  OLANZapine zydis (ZYPREXA) 10 MG disintegrating tablet Take 10 mg by mouth at bedtime.   Yes [provider]  simvastatin (ZOCOR) 40 MG tablet Take 40 mg by mouth daily.  09/15/17  Yes [provider]  tetrahydrozoline-zinc (VISINE-AC) 0.05-0.25 % ophthalmic solution Place 2 drops into both eyes 3 (three) times daily as needed (for dryness).   Yes [provider]  venlafaxine XR (EFFEXOR-XR) 37.5 MG 24 hr capsule Take 75 mg by mouth daily with breakfast.    Yes [provider]  hydrocortisone cream 1 % Apply to affected area 2 times daily Patient taking differently: Apply 1 application topically 2 (two) times daily as needed (for anal itching).  04/18/17   Palumbo, April, MD  LORazepam (ATIVAN) 1 MG tablet Take 1 tablet (1 mg total) by mouth 2 (two) times daily. Patient not taking: Reported on 10/05/2017 09/08/17   Nita Sells, MD  megestrol (MEGACE) 40 MG tablet Take 1 tablet (40 mg total) by mouth daily. Patient not taking: Reported on 10/05/2017 06/11/17   Money, Lowry Ram, FNP  polyethylene glycol [facility]) packet Take 17 g by mouth 2 (two) times daily. Patient not taking: Reported on 10/05/2017 04/18/17   Randal Buba, April, MD   Allergies    Allergen Reactions  . Codeine Other (See Comments)    "Makes my head feel crazy"  . Sulfonamide Derivatives Nausea Only  . Tetanus Toxoid Other (See Comments)    "Made a Knot" (at site)   Review of Systems  + anxiety- limited review of systems secondary to this  Physical Exam  General: Alert, awake, anxious and shaking.  Cachectic, frail, and chronically ill appearing  Heart: Regular rate and rhythm. No murmur appreciated. Lungs: Good air movement, clear Abdomen: Soft, nontender Ext: No significant edema Skin: Warm and dry Neuro: Grossly intact, nonfocal.   Vital Signs: BP 109/62 (BP Location: Right Arm)   Pulse 80   Temp 98.5 F (36.9 C) (Oral)   Resp 18   Ht 5' 2"  (1.575 m)   Wt 47.4 kg   SpO2 99%   BMI 19.11 kg/m  Pain Scale: 0-10 POSS *See Group Information*: 1-Acceptable,Awake and alert Pain Score: 0-No pain   SpO2: SpO2: 99 % O2 Device:SpO2: 99 % O2 Flow Rate: .O2 Flow Rate (L/min): 2 L/min  IO: Intake/output summary:   Intake/Output Summary (Last 24 hours) at 11/22/2017 1129 Last data filed at 11/22/2017 1126 Gross per 24 hour  Intake 2351.67 ml  Output 3300 ml  Net -948.33 ml    LBM: Last BM Date: 11/20/17 Baseline Weight: Weight: 45.4 kg Most recent weight: Weight: 47.4 kg     Palliative Assessment/Data:   Flowsheet Rows     Most Recent Value  Intake Tab  Referral Department  Hospitalist  Unit at Time of Referral  Med/Surg Unit  Palliative Care Primary Diagnosis  Other (Comment) [Psych]  Date Notified  11/18/17  Palliative Care Type  New Palliative care  Reason Not Seen  -- Ricarda Frame  referral]  Reason for referral  Clarify Goals of Care  Date of Admission  10/05/17  Date first seen by Palliative Care  11/21/17  # of days Palliative referral response time  3 Day(s)  # of days IP prior to Palliative referral  44  Clinical Assessment  Palliative Performance Scale Score  20%  Psychosocial & Spiritual Assessment  Palliative Care  Outcomes  Patient/Family meeting held?  No      Time Total: 50 minutes Greater than 50%  of this time was spent counseling and coordinating care related to the above assessment and plan.  Signed by: Micheline Rough, MD   Please contact Palliative Medicine Team phone at [phone] for questions and concerns.  For individual provider: See Shea Evans

## 2017-11-22 NOTE — Progress Notes (Signed)
   11/22/17 1000  Clinical Encounter Type  Visited With Health care provider  Visit Type Follow-up  Referral From Nurse  Consult/Referral To Chaplain  Responded to [facility] consult. Several chaplain has tried to visit with patient. Currently physical therapy is with her. When things level out please contact Spiritual Care for another visit.

## 2017-11-22 NOTE — Progress Notes (Signed)
PROGRESS NOTE  Amanda Hall BJY:[id number] DOB: July 02, 1950 DOA: 10/05/2017 PCP: Amanda Savers, MD  HPI/Recap of past 24 hours: 66 y.o.femalewith a history of depression and anxiety with recent psychiatric hospitalization who presented 9/25 with confusion, agitation, refusal to take medications or per oral intake found to have AKI and acute encephalopathy for which she was admitted. Psychiatry was consulted and guiding medical management. Due to mood-induced anorexia and severe malnutrition, cortrak was placed 10/7 and tube feedings started. Urine culture grew aerococcus for which ceftriaxone was started. The patient continues to have severe psychiatric symptoms, predominantly anxiety, and [facility]  11/15/17: Abnormal ekg. 2D echo obtained and revealed suspected cardiac amyloidosis. Consulted cardiology. 11/17/2017: Cardiology signed off and will see outpatient for cardiac MRI or nuclear scanning once her anxiety is improved.  We re-consulted psychiatry to reassess severe anxiety with some adjustment in her psych medications.  11/22/17: No acute events overnight. Anxious and minimally interactive. Cortrack still in place, may need to find alternative. Patient has pleasure feedings intermittently. Called her sister Amanda Hall to give update at [phone]  but no answer.   Assessment/Plan: Principal Problem:   MDD (major depressive disorder), recurrent severe, without psychosis (HCC) Active Problems:   MDD (major depressive disorder)   Anorexia   Dysphagia   Severe malnutrition (HCC)   Anxiety   Pressure injury of skin  Persistent severe depression and anxiety Psych medications adjusted [facility] One-to-one sitter at bedside for patient's own safety Currently on Klonopin 0.5 mg 3 times daily as needed for anxiety, Luvox 150 mg nightly, Remeron 45 mg nightly, Zyprexa 15 mg sublingual nightly,  thiamine 100 mg daily Psychiatry reconsulted to reassess severe anxiety Medication adjustment as stated above Psychiatry will be available as needed Poor prognosis Palliative care team consulted and signed off Chaplain consulted   Suspected cardiac amyloidosis 2D echo done on 11/15/2017 with suspicion for cardiac amyloidosis Cardiology consulted and signed off on 11/17/2017 Will see outpatient once anxiety is improved for possible cardiac MRI or nuclear scanning.  Stage 1 sacral decubitus ulcer Measuring 1cm Frequent turn q2h  Sinus tachycardia, resolved No clear etiology Continue to monitor closely 12 lead ekg on 11/14/17; independently reviewed revealed inversed t-wave in lateral leads. Monitor on telemetry  Severe protein calorie malnutrition due to anorexia Continue core track Dietitian following Continue to monitor electrolytes Continue appetite stimulants Continue free water flushes 300 cc q6h Unremarkable studies of phosphorus, magnesium, and BMP  Resolved AKI Hold off IV fluid to avoid fluid overload C/w free water flushes 300 cc 4 times daily  Iron deficiency anemia  Low saturation ratios on 09/08/2017 Continue ferrous sulfate supplement No sign of overt bleeding  Physical debility/ambulatory dysfunction Worked with OT no further recommendations   Code Status: Full code  Family Communication: None at bedside  Disposition Plan: Discharge to psych hospital when bed placement is available   Consultants:  Psychiatry  Cardiology  Chaplain  Palliative care   Procedures:  None  Antimicrobials:  None  DVT prophylaxis: Subcu Lovenox daily   Objective: Vitals:   11/21/17 1628 11/21/17 2039 11/22/17 0455 11/22/17 0859  BP: (!) 114/58 118/62 126/70 109/62  Pulse: 92 88 (!) 103 80  Resp: 17 16 20 18   Temp:  97.9 F (36.6 C) 98.9 F (37.2 C) 98.5 F (36.9 C)  TempSrc:  Oral  Oral  SpO2: 97% 99% 99% 99%  Weight:      Height:  Intake/Output Summary (Last 24 hours) at 11/22/2017 1713 Last data filed at 11/22/2017 1515 Gross per 24 hour  Intake 1579.75 ml  Output 1600 ml  Net -20.25 ml   Filed Weights   11/07/17 0500 11/17/17 1946 11/19/17 2155  Weight: 46.6 kg 47.3 kg 47.4 kg    Exam:  . General: 67 y.o. year-old female.  Emaciated in NAd minimally interactive . Cardiovascular: RRR no rubs or gallops. No JVD or thyromegaly. Marland Kitchen. Respiratory: Clear to auscultation with no wheezes or rales.  Poor inspiratory effort. . Abdomen: Soft nontender nondistended with normal bowel sounds x4 quadrants. . Musculoskeletal: No edema.  2/4 pulses in all 4 extremities. Marland Kitchen. Psychiatry: Mood is appropriate for condition and setting.  Data Reviewed: CBC: No results for input(s): WBC, NEUTROABS, HGB, HCT, MCV, PLT in the last 168 hours. Basic Metabolic Panel: Recent Labs  Lab 11/20/17 0554  NA 140  K 3.5  CL 101  CO2 34*  GLUCOSE 124*  BUN 36*  CREATININE 0.83  CALCIUM 10.2   GFR: Estimated Creatinine Clearance: 49.9 mL/min (by C-G formula based on SCr of 0.83 mg/dL). Liver Function Tests: No results for input(s): AST, ALT, ALKPHOS, BILITOT, PROT, ALBUMIN in the last 168 hours. No results for input(s): LIPASE, AMYLASE in the last 168 hours. No results for input(s): AMMONIA in the last 168 hours. Coagulation Profile: No results for input(s): INR, PROTIME in the last 168 hours. Cardiac Enzymes: No results for input(s): CKTOTAL, CKMB, CKMBINDEX, TROPONINI in the last 168 hours. BNP (last 3 results) No results for input(s): PROBNP in the last 8760 hours. HbA1C: No results for input(s): HGBA1C in the last 72 hours. CBG: Recent Labs  Lab 11/21/17 1126 11/21/17 1627 11/21/17 2049 11/22/17 0858 11/22/17 1215  GLUCAP 130* 109* 117* 118* 104*   Lipid Profile: No results for input(s): CHOL, HDL, LDLCALC, TRIG, CHOLHDL, LDLDIRECT in the last 72 hours. Thyroid Function Tests: No results for input(s): TSH,  T4TOTAL, FREET4, T3FREE, THYROIDAB in the last 72 hours. Anemia Panel: No results for input(s): VITAMINB12, FOLATE, FERRITIN, TIBC, IRON, RETICCTPCT in the last 72 hours. Urine analysis:    Component Value Date/Time   COLORURINE AMBER (A) 10/23/2017 0106   APPEARANCEUR CLOUDY (A) 10/23/2017 0106   LABSPEC 1.009 10/23/2017 0106   PHURINE 9.0 (H) 10/23/2017 0106   GLUCOSEU NEGATIVE 10/23/2017 0106   HGBUR MODERATE (A) 10/23/2017 0106   BILIRUBINUR NEGATIVE 10/23/2017 0106   KETONESUR NEGATIVE 10/23/2017 0106   PROTEINUR NEGATIVE 10/23/2017 0106   UROBILINOGEN 0.2 12/15/2006 1654   NITRITE NEGATIVE 10/23/2017 0106   LEUKOCYTESUR MODERATE (A) 10/23/2017 0106   Sepsis Labs: @LABRCNTIP (procalcitonin:4,lacticidven:4)  )No results found for this or any previous visit (from the past 240 hour(s)).    Studies: No results found.  Scheduled Meds: . chlorhexidine  15 mL Mouth Rinse BID  . enoxaparin (LOVENOX) injection  30 mg Subcutaneous Q24H  . ferrous sulfate  325 mg Oral Q breakfast  . fluvoxaMINE  150 mg Per Tube QHS  . free water  300 mL Per Tube Q6H  . magnesium hydroxide  30 mL Per Tube Daily  . magnesium oxide  400 mg Per Tube BID  . mouth rinse  15 mL Mouth Rinse q12n4p  . megestrol  400 mg Per Tube Daily  . mirtazapine  45 mg Per Tube QHS  . nystatin  5 mL Oral QID  . OLANZapine zydis  15 mg Sublingual QHS  . phosphorus  500 mg Oral TID WC  . polyethylene glycol  17 g Per Tube BID  . sennosides  5 mL Per Tube QHS  . simvastatin  10 mg Per Tube q1800  . sodium chloride flush  3 mL Intravenous Q12H  . thiamine  100 mg Oral Daily  . vitamin C  250 mg Per Tube BID    Continuous Infusions: . feeding supplement (OSMOLITE 1.2 CAL) 1,000 mL (11/22/17 0625)     LOS: 48 days     Darlin Drop, MD [facility] Pager [phone]  If 7PM-7AM, please contact night-coverage [email or web address] Password [email or web address] 11/22/2017, 5:13 PM

## 2017-11-23 LAB — GLUCOSE, CAPILLARY
GLUCOSE-CAPILLARY: 108 mg/dL — AB (ref 70–99)
Glucose-Capillary: 104 mg/dL — ABNORMAL HIGH (ref 70–99)
Glucose-Capillary: 123 mg/dL — ABNORMAL HIGH (ref 70–99)
Glucose-Capillary: 144 mg/dL — ABNORMAL HIGH (ref 70–99)

## 2017-11-23 NOTE — Progress Notes (Signed)
PROGRESS NOTE    Amanda Hall  ZOX:[id number] DOB: [birth year] DOA: 10/05/2017 PCP: Gordy SaversKwiatkowski, Peter F, MD   Brief Narrative: Amanda BreezeDana Hall is a 67 y.o. female with a history of depression, anxiety.  Patient presents secondary to confusion and agitation.  She is currently awaiting inpatient psychiatry placement for her severe depression and anxiety.   Assessment & Plan:   Principal Problem:   MDD (major depressive disorder), recurrent severe, without psychosis (HCC) Active Problems:   MDD (major depressive disorder)   Anorexia   Dysphagia   Severe malnutrition (HCC)   Anxiety   Pressure injury of skin  Severe depression/anxiety Still requiring inpatient psych placement per psychiatry recommendations -Continue Klonopin, Luvox, Remeron, Zyprexa, thiamine  Cardiac amyloidosis Suspected on Transthoracic Echocardiogram. -Outpatient follow-up  Stage 1 sacral decubitus ulcer -Frequent turning  Sinus tachycardia Resolved.  Severe protein calorie malnutrition Patient is s/p cortrak which was placed 37 days ago -Will need to remove NG tube  AKI Resolved  Iron deficiency anemia -Continue iron supplementation   Pressure Injury Documentation: Pressure Injury 11/22/17 Stage II -  Partial thickness loss of dermis presenting as a shallow open ulcer with a red, pink wound bed without slough. (Active)  11/22/17 1148   Location: Buttocks  Location Orientation: Left  Staging: Stage II -  Partial thickness loss of dermis presenting as a shallow open ulcer with a red, pink wound bed without slough.  Wound Description (Comments):   Present on Admission: No     DVT prophylaxis: Lovenox Code Status:   Code Status: Full Code Family Communication: None at bedside Disposition Plan: Inpatient psychiatry   Consultants:   Psychiatry  Cardiology  Palliative care  Procedures:   None  Antimicrobials:  None   Subjective: No issues per patient  Objective: Vitals:   11/22/17 1723 11/22/17 2108 11/23/17 0327 11/23/17 0922  BP: 121/71 124/79 140/81 116/60  Pulse: 92 96 (!) 101 (!) 104  Resp: 18 16 (!) 21 16  Temp: 98.9 F (37.2 C) (!) 97.5 F (36.4 C) 97.7 F (36.5 C) 97.9 F (36.6 C)  TempSrc: Axillary Oral Oral Oral  SpO2: 100% 100% 96% 99%  Weight:      Height:        Intake/Output Summary (Last 24 hours) at 11/23/2017 1358 Last data filed at 11/23/2017 1111 Gross per 24 hour  Intake 326.25 ml  Output 2100 ml  Net -1773.75 ml   Filed Weights   11/07/17 0500 11/17/17 1946 11/19/17 2155  Weight: 46.6 kg 47.3 kg 47.4 kg    Examination:  General exam: Appears calm and comfortable Respiratory system: Clear to auscultation. Respiratory effort normal. Cardiovascular system: S1 & S2 heard, RRR. No murmurs, rubs, gallops or clicks. Gastrointestinal system: Abdomen is nondistended, soft and nontender. No organomegaly or masses felt. Normal bowel sounds heard. Central nervous system: Alert and oriented. No focal neurological deficits. Extremities: No edema. No calf tenderness Skin: No cyanosis. No rashes Psychiatry: Anxious    Data Reviewed: I have personally reviewed following labs and imaging studies  CBC: No results for input(s): WBC, NEUTROABS, HGB, HCT, MCV, PLT in the last 168 hours. Basic Metabolic Panel: Recent Labs  Lab 11/20/17 0554  NA 140  K 3.5  CL 101  CO2 34*  GLUCOSE 124*  BUN 36*  CREATININE 0.83  CALCIUM 10.2   GFR: Estimated Creatinine Clearance: 49.9 mL/min (by C-G formula based on SCr of 0.83 mg/dL). Liver Function Tests: No results for input(s): AST, ALT, ALKPHOS, BILITOT, PROT, ALBUMIN in  the last 168 hours. No results for input(s): LIPASE, AMYLASE in the last 168 hours. No results for input(s): AMMONIA in the last 168 hours. Coagulation Profile: No results for input(s): INR, PROTIME in the last 168 hours. Cardiac Enzymes: No results for input(s): CKTOTAL, CKMB, CKMBINDEX, TROPONINI in the last 168  hours. BNP (last 3 results) No results for input(s): PROBNP in the last 8760 hours. HbA1C: No results for input(s): HGBA1C in the last 72 hours. CBG: Recent Labs  Lab 11/22/17 1215 11/22/17 1721 11/22/17 2108 11/23/17 0756 11/23/17 1148  GLUCAP 104* 135* 110* 104* 108*   Lipid Profile: No results for input(s): CHOL, HDL, LDLCALC, TRIG, CHOLHDL, LDLDIRECT in the last 72 hours. Thyroid Function Tests: No results for input(s): TSH, T4TOTAL, FREET4, T3FREE, THYROIDAB in the last 72 hours. Anemia Panel: No results for input(s): VITAMINB12, FOLATE, FERRITIN, TIBC, IRON, RETICCTPCT in the last 72 hours. Sepsis Labs: No results for input(s): PROCALCITON, LATICACIDVEN in the last 168 hours.  No results found for this or any previous visit (from the past 240 hour(s)).       Radiology Studies: No results found.      Scheduled Meds: . chlorhexidine  15 mL Mouth Rinse BID  . enoxaparin (LOVENOX) injection  30 mg Subcutaneous Q24H  . ferrous sulfate  325 mg Oral Q breakfast  . fluvoxaMINE  150 mg Per Tube QHS  . free water  300 mL Per Tube Q6H  . magnesium hydroxide  30 mL Per Tube Daily  . magnesium oxide  400 mg Per Tube BID  . mouth rinse  15 mL Mouth Rinse q12n4p  . megestrol  400 mg Per Tube Daily  . mirtazapine  45 mg Per Tube QHS  . nystatin  5 mL Oral QID  . OLANZapine zydis  15 mg Sublingual QHS  . phosphorus  500 mg Oral TID WC  . polyethylene glycol  17 g Per Tube BID  . sennosides  5 mL Per Tube QHS  . simvastatin  10 mg Per Tube q1800  . sodium chloride flush  3 mL Intravenous Q12H  . thiamine  100 mg Oral Daily  . vitamin C  250 mg Per Tube BID   Continuous Infusions: . feeding supplement (OSMOLITE 1.2 CAL) 1,000 mL (11/22/17 0625)     LOS: 49 days     Jacquelin Hawking, MD [facility] 11/23/2017, 1:58 PM  If 7PM-7AM, please contact night-coverage [email or web address]

## 2017-11-23 NOTE — Progress Notes (Signed)
   11/23/17 1024  Clinical Encounter Type  Visited With Patient;Health care provider (RN)  Visit Type Follow-up  Referral From Physician  Consult/Referral To Chaplain  The chaplain followed up with Pt. Spiritual care consult. The chaplain appreciates the RN's role in coordinating the best time for visit with Pt. medication and existing routine.  The Pt. was sleeping at time of visit. The chaplain will return for spiritual care visit Thursday morning around 8am.

## 2017-11-24 LAB — GLUCOSE, CAPILLARY
GLUCOSE-CAPILLARY: 114 mg/dL — AB (ref 70–99)
GLUCOSE-CAPILLARY: 126 mg/dL — AB (ref 70–99)
GLUCOSE-CAPILLARY: 134 mg/dL — AB (ref 70–99)
Glucose-Capillary: 182 mg/dL — ABNORMAL HIGH (ref 70–99)

## 2017-11-24 MED ORDER — OLANZAPINE 5 MG PO TBDP
2.5000 mg | ORAL_TABLET | Freq: Every day | ORAL | Status: DC
  Administered 2017-11-25 – 2017-12-02 (×7): 2.5 mg via ORAL
  Filled 2017-11-24 (×8): qty 0.5

## 2017-11-24 NOTE — Progress Notes (Signed)
   11/24/17 0805  Clinical Encounter Type  Visited With Patient  Visit Type Follow-up  Referral From Chaplain  Consult/Referral To Chaplain  Patient was sleeping upon chaplains arrival.  Check in with the NT confirmed chaplain's choice to return at another time.  Chaplain will follow up as needed. RN can page spiritual care with a best time for a visit.

## 2017-11-24 NOTE — Consult Note (Signed)
[facility] Psych Consult Progress Note  11/24/2017 1:53 PM Amanda Hall  MRN:  [id number] Subjective:   Amanda Hall was last seen by the psychiatry consult service on 11/7 for medication management. Lithium was discontinued and Zyprexa was increased to 15 mg qhs. She will need a PEG placed due to poor PO intake in the setting of severe protein calorie malnutrition.   On interview, Amanda Hall reports that she feels fine and later reports that she is still anxious and her anxiety has not improved. She denies SI, HI or AVH. She was informed about the need for PEG tube placement. She did not appear to understand the need for undergoing this procedure. She became more anxious and repeatedly stated, "Oh my God." She reported that she did not realize that her condition was "that bad." She asked the opinion of the notewriter if she should get a PEG tube and reported, "I don't like this." Her questions were answered and she was encouraged to ask any further questions about the procedure to her primary team.   Principal Problem: MDD (major depressive disorder), recurrent severe, without psychosis (HCC) Diagnosis:   Patient Active Problem List   Diagnosis Date Noted  . Pressure injury of skin [L89.90] 11/22/2017  . Anxiety [F41.9] 10/17/2017  . Dysphagia [R13.10] 10/12/2017  . Severe malnutrition (HCC) [E43] 10/12/2017  . MDD (major depressive disorder), recurrent severe, without psychosis (HCC) [F33.2]   . Anorexia [R63.0] 10/05/2017  . Uremia [N19]   . Protein-calorie malnutrition, severe [E43] 08/29/2017  . Generalized anxiety disorder [F41.1] 05/26/2017  . MDD (major depressive disorder) [F32.9] 05/26/2017  . Hypercholesterolemia [E78.00] 03/22/2017  . Nonspecific (abnormal) findings on radiological and other examination of body structure [793] 12/26/2006  . NONSPECIFIC ABNORM FIND RAD&OTH EXAM LUNG FIELD [R93.0] 12/26/2006  . ANEMIA-IRON DEFICIENCY [D50.9] 12/15/2006  . Depression [F32.9] 12/15/2006  .  Allergic rhinitis [J30.9] 12/15/2006   Total Time spent with patient: 15 minutes  Past Psychiatric History: MDD and anxiety.   Past Medical History:  Past Medical History:  Diagnosis Date  . Allergy   . Anemia   . Anxiety   . Depression   . Hemorrhoids     Past Surgical History:  Procedure Laterality Date  . arm surgery     Left  . TUBAL LIGATION     Family History:  Family History  Problem Relation Age of Onset  . Stroke Mother   . Hypertension Sister   . Cancer Neg Hx        breat and colon hx  . Diabetes Neg Hx        family   Family Psychiatric  History: Grandfather-depression and committed suicide.  Social History:  Social History   Substance and Sexual Activity  Alcohol Use No     Social History   Substance and Sexual Activity  Drug Use No    Social History   Socioeconomic History  . Marital status: Divorced    Spouse name: Not on file  . Number of children: Not on file  . Years of education: Not on file  . Highest education level: Not on file  Occupational History  . Not on file  Social Needs  . Financial resource strain: Not on file  . Food insecurity:    Worry: Not on file    Inability: Not on file  . Transportation needs:    Medical: Not on file    Non-medical: Not on file  Tobacco Use  . Smoking status: Never Smoker  .  Smokeless tobacco: Never Used  Substance and Sexual Activity  . Alcohol use: No  . Drug use: No  . Sexual activity: Not Currently  Lifestyle  . Physical activity:    Days per week: Not on file    Minutes per session: Not on file  . Stress: Not on file  Relationships  . Social connections:    Talks on phone: Not on file    Gets together: Not on file    Attends religious service: Not on file    Active member of club or organization: Not on file    Attends meetings of clubs or organizations: Not on file    Relationship status: Not on file  Other Topics Concern  . Not on file  Social History Narrative  . Not on  file    Sleep: Fair  Appetite:  Poor  Current Medications: Current Facility-Administered Medications  Medication Dose Route Frequency Provider Last Rate Last Dose  . acetaminophen (TYLENOL) tablet 650 mg  650 mg Oral Q6H PRN Standley Brooking, MD   650 mg at 11/16/17 2356   Or  . acetaminophen (TYLENOL) suppository 650 mg  650 mg Rectal Q6H PRN Standley Brooking, MD      . chlorhexidine (PERIDEX) 0.12 % solution 15 mL  15 mL Mouth Rinse BID Dow Adolph N, DO   15 mL at 11/24/17 4098  . clonazePAM (KLONOPIN) tablet 0.5 mg  0.5 mg Per Tube TID PRN Tyrone Nine, MD   0.5 mg at 11/24/17 1242  . enoxaparin (LOVENOX) injection 30 mg  30 mg Subcutaneous Q24H Standley Brooking, MD   30 mg at 11/24/17 1191  . feeding supplement (OSMOLITE 1.2 CAL) liquid 1,000 mL  1,000 mL Per Tube Continuous Regalado, Belkys A, MD 55 mL/hr at 11/23/17 2311 1,000 mL at 11/23/17 2311  . ferrous sulfate tablet 325 mg  325 mg Oral Q breakfast Darlin Drop, DO   325 mg at 11/24/17 4782  . fluvoxaMINE (LUVOX) tablet 150 mg  150 mg Per Tube QHS Audrea Muscat T, NP   150 mg at 11/23/17 2307  . free water 300 mL  300 mL Per Tube Q6H Hall, Carole N, DO   300 mL at 11/24/17 1243  . labetalol (NORMODYNE,TRANDATE) injection 5 mg  5 mg Intravenous Q2H PRN Jerald Kief, MD   5 mg at 11/04/17 1133  . loperamide (IMODIUM) capsule 2 mg  2 mg Oral PRN Jerald Kief, MD      . magnesium hydroxide (MILK OF MAGNESIA) suspension 30 mL  30 mL Per Tube Daily Blount, Xenia T, NP   30 mL at 11/24/17 0832  . magnesium oxide (MAG-OX) tablet 400 mg  400 mg Per Tube BID Regalado, Belkys A, MD   400 mg at 11/24/17 0832  . MEDLINE mouth rinse  15 mL Mouth Rinse q12n4p Dow Adolph N, DO   15 mL at 11/24/17 1243  . megestrol (MEGACE) 400 MG/10ML suspension 400 mg  400 mg Per Tube Daily Audrea Muscat T, NP   400 mg at 11/24/17 0832  . milk and molasses enema  1 enema Rectal Daily PRN Regalado, Belkys A, MD      . mirtazapine (REMERON  SOL-TAB) disintegrating tablet 45 mg  45 mg Per Tube QHS Blount, Xenia T, NP   45 mg at 11/23/17 2309  . naphazoline-glycerin (CLEAR EYES REDNESS) ophth solution 2 drop  2 drop Both Eyes QID PRN Standley Brooking, MD      .  nystatin (MYCOSTATIN) 100000 UNIT/ML suspension 500,000 Units  5 mL Oral QID Dow Adolph N, DO   500,000 Units at 11/24/17 1242  . OLANZapine zydis (ZYPREXA) disintegrating tablet 15 mg  15 mg Sublingual QHS Hall, Carole N, DO   15 mg at 11/23/17 2308  . ondansetron (ZOFRAN) tablet 4 mg  4 mg Per Tube Q6H PRN Blount, Andi Devon T, NP       Or  . ondansetron (ZOFRAN) injection 4 mg  4 mg Intravenous Q6H PRN Audrea Muscat T, NP   4 mg at 11/15/17 0457  . phosphorus (K PHOS NEUTRAL) tablet 500 mg  500 mg Oral TID WC Tyrone Nine, MD   500 mg at 11/24/17 1242  . polyethylene glycol (MIRALAX / GLYCOLAX) packet 17 g  17 g Per Tube BID Audrea Muscat T, NP   17 g at 11/24/17 0833  . sennosides (SENOKOT) 8.8 MG/5ML syrup 5 mL  5 mL Per Tube QHS Blount, Xenia T, NP   5 mL at 11/23/17 2306  . simvastatin (ZOCOR) tablet 10 mg  10 mg Per Tube q1800 Audrea Muscat T, NP   10 mg at 11/23/17 1707  . sodium chloride flush (NS) 0.9 % injection 3 mL  3 mL Intravenous Q12H Standley Brooking, MD   3 mL at 11/23/17 2306  . thiamine (VITAMIN B-1) tablet 100 mg  100 mg Oral Daily Tyrone Nine, MD   100 mg at 11/24/17 0831  . vitamin C (ASCORBIC ACID) tablet 250 mg  250 mg Per Tube BID Tyrone Nine, MD   250 mg at 11/24/17 1610    Lab Results:  Results for orders placed or performed during the hospital encounter of 10/05/17 (from the past 48 hour(s))  Glucose, capillary     Status: Abnormal   Collection Time: 11/22/17  5:21 PM  Result Value Ref Range   Glucose-Capillary 135 (H) 70 - 99 mg/dL  Glucose, capillary     Status: Abnormal   Collection Time: 11/22/17  9:08 PM  Result Value Ref Range   Glucose-Capillary 110 (H) 70 - 99 mg/dL  Glucose, capillary     Status: Abnormal   Collection Time:  11/23/17  7:56 AM  Result Value Ref Range   Glucose-Capillary 104 (H) 70 - 99 mg/dL  Glucose, capillary     Status: Abnormal   Collection Time: 11/23/17 11:48 AM  Result Value Ref Range   Glucose-Capillary 108 (H) 70 - 99 mg/dL  Glucose, capillary     Status: Abnormal   Collection Time: 11/23/17  5:12 PM  Result Value Ref Range   Glucose-Capillary 123 (H) 70 - 99 mg/dL  Glucose, capillary     Status: Abnormal   Collection Time: 11/23/17  8:30 PM  Result Value Ref Range   Glucose-Capillary 144 (H) 70 - 99 mg/dL   Comment 1 Notify RN    Comment 2 Document in Chart   Glucose, capillary     Status: Abnormal   Collection Time: 11/24/17  8:01 AM  Result Value Ref Range   Glucose-Capillary 114 (H) 70 - 99 mg/dL  Glucose, capillary     Status: Abnormal   Collection Time: 11/24/17 11:29 AM  Result Value Ref Range   Glucose-Capillary 126 (H) 70 - 99 mg/dL    Blood Alcohol level:  Lab Results  Component Value Date   ETH <10 06/12/2017   ETH <10 05/25/2017    Musculoskeletal: Strength & Muscle Tone: Generalized weakness. Gait & Station: unable to  stand Patient leans: N/A  Psychiatric Specialty Exam: Physical Exam  Nursing note and vitals reviewed. Constitutional: She is oriented to person, place, and time. She appears well-developed.  Thin   HENT:  Head: Normocephalic and atraumatic.  Neck: Normal range of motion.  Respiratory: Effort normal.  Musculoskeletal: Normal range of motion.  Neurological: She is alert and oriented to person, place, and time.  Psychiatric: Her speech is normal. Judgment and thought content normal. Her mood appears anxious. She is slowed. Cognition and memory are impaired.    Review of Systems  Cardiovascular: Negative for chest pain.  Gastrointestinal: Negative for abdominal pain, constipation, diarrhea, nausea and vomiting.  Psychiatric/Behavioral: Negative for hallucinations, substance abuse and suicidal ideas. The patient is nervous/anxious.  The patient does not have insomnia.   All other systems reviewed and are negative.   Blood pressure 123/64, pulse 81, temperature 98 F (36.7 C), temperature source Oral, resp. rate 16, height 5\' 2"  (1.575 m), weight 47.4 kg, SpO2 100 %.Body mass index is 19.11 kg/m.  General Appearance: Fairly Groomed, elderly, cachetic, Caucasian female, wearing a hospital gown and lying in bed. NAD.  Eye Contact:  Fair  Speech:  Clear and Coherent and Slow  Volume:  Normal  Mood:  Anxious  Affect:  Congruent  Thought Process:  Goal Directed, Linear and Descriptions of Associations: Intact  Orientation:  Full (Time, Place, and Person)  Thought Content:  Logical  Suicidal Thoughts:  No  Homicidal Thoughts:  No  Memory:  Immediate;   Fair Recent;   Fair Remote;   Fair  Judgement:  Impaired  Insight:  Shallow  Psychomotor Activity:  Decreased  Concentration:  Concentration: Good and Attention Span: Good  Recall:  Good  Fund of Knowledge:  Fair  Language:  Good  Akathisia:  No  Handed:  Right  AIMS (if indicated):   N/A  Assets:  Housing Social Support  ADL's:  Impaired  Cognition:  Impaired due to psychiatric condition.   Sleep:   Fair. Improving.    Assessment:  Amanda Hall is a 67 y.o. female who was admitted with AKI secondary to poor PO intake. She continues to exhibit signs and symptoms of depression and anxiety including psychomotor retardation and poor appetite with significant weight loss requiring tube feeds and likely PEG tube placement.She continues to warrant inpatient psychiatric hospitalization for stabilization and treatment. She may benefit from a non medication treatment modality such as ECT (if medically stable enough to undergo) since she appears to have refractory depression and anxiety.   Treatment Plan Summary: -Patient warrants inpatient psychiatric hospitalizationfor severe depression and anxiety causing significant impairments in daily functioning with poor PO  intake and severe malnutrition. -Continue bedside sitter. -Continue Zyprexa 15 mg qhs and start Zyprexa 2.5 mg daily for mood stabilization and anxiety. -Continue Luvox 150 mg qhsfor depression and anxietyandRemeron 45 mg qhsfor depression and anxiety. -Continue Klonopin 0.5 mg TID PRN.  -EKG reviewed and QTc435 on 11/4. Please closely monitor when starting or increasing QTc prolonging agents. -Patient is IVC'd so she may not leave the hospital. -Patient does not have capacity to refuse or consent to PEG tube placement since she lacks understanding of the seriousness of her condition.  -Willfollow patient as needed.  Cherly BeachJacqueline J Tracie Dore, DO 11/24/2017, 1:53 PM

## 2017-11-24 NOTE — Progress Notes (Signed)
PROGRESS NOTE    Amanda Hall  EAV:[id number] DOB: 19-Sep-1950 DOA: 10/05/2017 PCP: Gordy SaversKwiatkowski, Peter F, MD   Brief Narrative: Amanda BreezeDana Hall is a 67 y.o. female with a history of depression, anxiety.  Patient presents secondary to confusion and agitation.  She is currently awaiting inpatient psychiatry placement for her severe depression and anxiety.   Assessment & Plan:   Principal Problem:   MDD (major depressive disorder), recurrent severe, without psychosis (HCC) Active Problems:   MDD (major depressive disorder)   Anorexia   Dysphagia   Severe malnutrition (HCC)   Anxiety   Pressure injury of skin  Severe depression/anxiety Still requiring inpatient psych placement per psychiatry recommendations -Continue Klonopin, Luvox, Remeron, Zyprexa, thiamine -Re-consult psych for reevaluation  Cardiac amyloidosis Suspected on Transthoracic Echocardiogram. -Outpatient follow-up  Stage 1 sacral decubitus ulcer -Frequent turning  Sinus tachycardia Resolved.  Severe protein calorie malnutrition Patient is s/p cortrak which was placed 30+ days ago -Will need to remove NG tube and either replace or consider PEG tube   AKI Resolved  Iron deficiency anemia -Continue iron supplementation   Pressure Injury Documentation: Pressure Injury 11/22/17 Stage II -  Partial thickness loss of dermis presenting as a shallow open ulcer with a red, pink wound bed without slough. (Active)  11/22/17 1148   Location: Buttocks  Location Orientation: Left  Staging: Stage II -  Partial thickness loss of dermis presenting as a shallow open ulcer with a red, pink wound bed without slough.  Wound Description (Comments):   Present on Admission: No     DVT prophylaxis: Lovenox Code Status:   Code Status: Full Code Family Communication: None at bedside Disposition Plan: Inpatient psychiatry   Consultants:   Psychiatry  Cardiology  Palliative care  Procedures:    None  Antimicrobials:  None   Subjective: No issues overnight  Objective: Vitals:   11/23/17 1706 11/23/17 2025 11/24/17 0547 11/24/17 0759  BP: 130/84 124/67 (!) 145/83 123/64  Pulse: (!) 103 92 97 81  Resp: 18 18 15 16   Temp: 98.8 F (37.1 C) 98.5 F (36.9 C) 98.6 F (37 C) 98 F (36.7 C)  TempSrc: Oral Oral Oral Oral  SpO2: 100% 100% 100% 100%  Weight:      Height:        Intake/Output Summary (Last 24 hours) at 11/24/2017 1050 Last data filed at 11/23/2017 2100 Gross per 24 hour  Intake -  Output 1100 ml  Net -1100 ml   Filed Weights   11/07/17 0500 11/17/17 1946 11/19/17 2155  Weight: 46.6 kg 47.3 kg 47.4 kg    Examination:  General: Well appearing, no distress   Data Reviewed: I have personally reviewed following labs and imaging studies  CBC: No results for input(s): WBC, NEUTROABS, HGB, HCT, MCV, PLT in the last 168 hours. Basic Metabolic Panel: Recent Labs  Lab 11/20/17 0554  NA 140  K 3.5  CL 101  CO2 34*  GLUCOSE 124*  BUN 36*  CREATININE 0.83  CALCIUM 10.2   GFR: Estimated Creatinine Clearance: 49.9 mL/min (by C-G formula based on SCr of 0.83 mg/dL). Liver Function Tests: No results for input(s): AST, ALT, ALKPHOS, BILITOT, PROT, ALBUMIN in the last 168 hours. No results for input(s): LIPASE, AMYLASE in the last 168 hours. No results for input(s): AMMONIA in the last 168 hours. Coagulation Profile: No results for input(s): INR, PROTIME in the last 168 hours. Cardiac Enzymes: No results for input(s): CKTOTAL, CKMB, CKMBINDEX, TROPONINI in the last 168 hours. BNP (  last 3 results) No results for input(s): PROBNP in the last 8760 hours. HbA1C: No results for input(s): HGBA1C in the last 72 hours. CBG: Recent Labs  Lab 11/23/17 0756 11/23/17 1148 11/23/17 1712 11/23/17 2030 11/24/17 0801  GLUCAP 104* 108* 123* 144* 114*   Lipid Profile: No results for input(s): CHOL, HDL, LDLCALC, TRIG, CHOLHDL, LDLDIRECT in the last 72  hours. Thyroid Function Tests: No results for input(s): TSH, T4TOTAL, FREET4, T3FREE, THYROIDAB in the last 72 hours. Anemia Panel: No results for input(s): VITAMINB12, FOLATE, FERRITIN, TIBC, IRON, RETICCTPCT in the last 72 hours. Sepsis Labs: No results for input(s): PROCALCITON, LATICACIDVEN in the last 168 hours.  No results found for this or any previous visit (from the past 240 hour(s)).       Radiology Studies: No results found.      Scheduled Meds: . chlorhexidine  15 mL Mouth Rinse BID  . enoxaparin (LOVENOX) injection  30 mg Subcutaneous Q24H  . ferrous sulfate  325 mg Oral Q breakfast  . fluvoxaMINE  150 mg Per Tube QHS  . free water  300 mL Per Tube Q6H  . magnesium hydroxide  30 mL Per Tube Daily  . magnesium oxide  400 mg Per Tube BID  . mouth rinse  15 mL Mouth Rinse q12n4p  . megestrol  400 mg Per Tube Daily  . mirtazapine  45 mg Per Tube QHS  . nystatin  5 mL Oral QID  . OLANZapine zydis  15 mg Sublingual QHS  . phosphorus  500 mg Oral TID WC  . polyethylene glycol  17 g Per Tube BID  . sennosides  5 mL Per Tube QHS  . simvastatin  10 mg Per Tube q1800  . sodium chloride flush  3 mL Intravenous Q12H  . thiamine  100 mg Oral Daily  . vitamin C  250 mg Per Tube BID   Continuous Infusions: . feeding supplement (OSMOLITE 1.2 CAL) 1,000 mL (11/23/17 2311)     LOS: 50 days     Jacquelin Hawking, MD [facility] 11/24/2017, 10:50 AM  If 7PM-7AM, please contact night-coverage [email or web address]

## 2017-11-24 NOTE — Progress Notes (Signed)
Prolonged admission. Patient IVC'd. [facility] Discussed with Dr Caleb PoppNettey in progression rounds this am.  Discussed Dr Mal MistyNetty consulting psych to readjust meds as patient is still not eating. Looking into the need to replace cortrack (30 day mark) and discussed progressing conversation with NOK sister to place PEG. CM and CSW following.

## 2017-11-25 ENCOUNTER — Inpatient Hospital Stay (HOSPITAL_COMMUNITY): Payer: Medicare Other

## 2017-11-25 LAB — BASIC METABOLIC PANEL
ANION GAP: 8 (ref 5–15)
BUN: 32 mg/dL — ABNORMAL HIGH (ref 8–23)
CALCIUM: 9.5 mg/dL (ref 8.9–10.3)
CO2: 30 mmol/L (ref 22–32)
Chloride: 101 mmol/L (ref 98–111)
Creatinine, Ser: 0.73 mg/dL (ref 0.44–1.00)
GFR calc non Af Amer: >60 mL/min (ref >60)
GLUCOSE: 135 mg/dL — AB (ref 70–99)
POTASSIUM: 4.1 mmol/L (ref 3.5–5.1)
Sodium: 139 mmol/L (ref 135–145)

## 2017-11-25 LAB — GLUCOSE, CAPILLARY
GLUCOSE-CAPILLARY: 131 mg/dL — AB (ref 70–99)
Glucose-Capillary: 121 mg/dL — ABNORMAL HIGH (ref 70–99)

## 2017-11-25 LAB — PHOSPHORUS: Phosphorus: 3.2 mg/dL (ref 2.5–4.6)

## 2017-11-25 LAB — MAGNESIUM: MAGNESIUM: 2.1 mg/dL (ref 1.7–2.4)

## 2017-11-25 MED ORDER — OSMOLITE 1.2 CAL PO LIQD
1000.0000 mL | ORAL | Status: DC
  Administered 2017-11-25: 1000 mL
  Filled 2017-11-25 (×7): qty 1000

## 2017-11-25 NOTE — Progress Notes (Signed)
Palliative Medicine RN Note: Discussed patient in team rounds. Because her physical decline is related to psych issues (for which she is pending aggressive treatment), PMT does not really have a place to discuss GOC. Please see Dr Onnie BoerFreeman's previous note.   We will follow from a distance. Updated Dr Caleb PoppNettey. Please call us if new needs arise.  Margret ChanceMelanie G. Esraa Seres, RN, BSN, [facility] Palliative Medicine Team 11/25/2017 2:12 PM Office [phone]

## 2017-11-25 NOTE — Progress Notes (Addendum)
Nutrition Follow-up  DOCUMENTATION CODES:   Severe malnutrition in context of social or environmental circumstances, Underweight  INTERVENTION:   Tube Feeding:  Increase Osmolite 1.2 to 60 ml/hr Provides 1728 kcals, 80 g of protein and 1166 mL of free water Total free water with flushes: 2366 mL of free water  Once G-tube placed, plan to transition to bolus feedings  Add on phosphorus and magnesium to today's labs  NUTRITION DIAGNOSIS:   Severe Malnutrition related to social / environmental circumstances(severe major depressive disorder, refractory depression, anxiety) as evidenced by severe fat depletion, severe muscle depletion.  Addressed via nutrition supoort  GOAL:   Patient will meet greater than or equal to 90% of their needs  Met via nutrition support  MONITOR:   PO intake, Supplement acceptance, Labs, Weight trends  REASON FOR ASSESSMENT:   Malnutrition Screening Tool    ASSESSMENT:   67 yo female admitted with AKI, dehydration, anorexia with acute metabolic encephalopathy, FTT. Pt has been refusing to eat, drink or take medications. Pt has severe protein calorie malnutrition. Pt with recent inpatient psych hospitalization 1 month ago. Pt reports difficulty swallowing although pt's sister believes this to be subjective. Pt with MDD, severe, with psych consult. PMH includes anxiety, depression, malnutrition   Pt continues IVC with 1:1 sitter awaiting inpatient psych placement Pt alert, answers questions but does not open eyes  Osmolite 1.2 @ 55 ml/hr infusing via Cortrak tube, free water 300 mL q 6 hours. Cortrak in place since 10/07 (>30 days)  Pt continues to eat only bites, usually 1-2 bites of Magic Cup  IR consulted for G-tube, CT abdomen today to evaluate anatomy  Pt receiving phosphorus supplementation; no recent phosphorus checked  New stage II documented on buttock  Labs: Creatinine wdl, BUN 32, sodium and potassium wdl, CBGs 104-182 Meds:  megace, remeron, thiamine, vitamin C, phosphorus, ferrous sulfate   Diet Order:   Diet Order            DIET - DYS 1 Room service appropriate? Yes; Fluid consistency: Thin  Diet effective now              EDUCATION NEEDS:   Not appropriate for education at this time  Skin:  Skin Assessment: Skin Integrity Issues: Skin Integrity Issues:: Stage II Stage II: buttock  Last BM:  11/15  Height:   Ht Readings from Last 1 Encounters:  10/05/17 5' 2"  (1.575 m)    Weight:   Wt Readings from Last 1 Encounters:  11/19/17 47.4 kg    Ideal Body Weight:  50 kg  BMI:  Body mass index is 19.11 kg/m.  Estimated Nutritional Needs:   Kcal:  1550-1750 kcals   Protein:  72-82  Fluid:  >/= 1.7 L   Kerman Passey MS, RD, LDN, CNSC [phone] Pager  [phone] Weekend/On-Call Pager

## 2017-11-25 NOTE — Clinical Social Work Note (Signed)
CSW contacted [facility] and confirmed that patient remains on wait list for a bed. CSW will continue to follow and update IVC paperwork on 11/18 if patient remains in hospital.  Genelle BalVanessa Collan Schoenfeld, MSW, LCSW Licensed Clinical Social Worker Clinical Social Work Department [facility] [phone]

## 2017-11-25 NOTE — Progress Notes (Signed)
PROGRESS NOTE    Amanda Hall  VHQ:[id number] DOB: [birth year] DOA: 10/05/2017 PCP: [name], MD   Brief Narrative: Amanda Hall is a 67 y.o. female with a history of depression, anxiety.  Patient presents secondary to confusion and agitation.  She is currently awaiting inpatient psychiatry placement for her severe depression and anxiety.   Assessment & Plan:   Principal Problem:   MDD (major depressive disorder), recurrent severe, without psychosis (HCC) Active Problems:   MDD (major depressive disorder)   Anorexia   Dysphagia   Severe malnutrition (HCC)   Anxiety   Pressure injury of skin  Severe depression/anxiety Still requiring inpatient psych placement per psychiatry recommendations -Continue Klonopin 0.5 mg TID prn, Luvox 150 mg qhs, Remeron 45 mg qhs, Zyprexa 15 mg qhs, thiamine -Psychiatry added Zyprexa 2.5 mg daily   Cardiac amyloidosis Suspected on Transthoracic Echocardiogram. -Outpatient follow-up  Sinus tachycardia Resolved.  Severe protein calorie malnutrition Patient is s/p cortrak which was placed 30+ days ago. Discussed with patient, although she does not have capacity, and she is agreeable to placement. Discussed with next of kin, patient's sister, and she is in agreement. -IR consult for PEG   AKI Resolved  Iron deficiency anemia -Continue iron supplementation   Pressure Injury Documentation: Pressure Injury 11/22/17 Stage II -  Partial thickness loss of dermis presenting as a shallow open ulcer with a red, pink wound bed without slough. (Active)  11/22/17 1148   Location: Buttocks  Location Orientation: Left  Staging: Stage II -  Partial thickness loss of dermis presenting as a shallow open ulcer with a red, pink wound bed without slough.  Wound Description (Comments):   Present on Admission: No     DVT prophylaxis: Lovenox Code Status:   Code Status: Full Code Family Communication: None at bedside Disposition Plan: Inpatient  psychiatry   Consultants:   Psychiatry  Cardiology  Palliative care  Procedures:   None  Antimicrobials:  None   Subjective: Patient without concerns today.  Objective: Vitals:   11/24/17 1719 11/24/17 2157 11/25/17 0304 11/25/17 0751  BP: 106/60 119/63 123/71 (!) 110/59  Pulse: (!) 104 96 98 80  Resp: 16 18 18 14   Temp: 98.2 F (36.8 C) 98.7 F (37.1 C)  97.6 F (36.4 C)  TempSrc: Oral Oral  Oral  SpO2: 100% 100% 100% 100%  Weight:      Height:        Intake/Output Summary (Last 24 hours) at 11/25/2017 1205 Last data filed at 11/25/2017 1048 Gross per 24 hour  Intake 6737.5 ml  Output 150 ml  Net 6587.5 ml   Filed Weights   11/07/17 0500 11/17/17 1946 11/19/17 2155  Weight: 46.6 kg 47.3 kg 47.4 kg    Examination:  General exam: Appears calm and comfortable Respiratory system: Clear to auscultation. Respiratory effort normal. Cardiovascular system: S1 & S2 heard, RRR. No murmurs, rubs, gallops or clicks. Gastrointestinal system: Abdomen is nondistended, soft and nontender. No organomegaly or masses felt. Normal bowel sounds heard. Central nervous system: Alert and oriented. Extremities: No edema. No calf tenderness Skin: No cyanosis. No rashes Psychiatry: Flat affect and depressed mood   Data Reviewed: I have personally reviewed following labs and imaging studies  CBC: No results for input(s): WBC, NEUTROABS, HGB, HCT, MCV, PLT in the last 168 hours. Basic Metabolic Panel: Recent Labs  Lab 11/20/17 0554  NA 140  K 3.5  CL 101  CO2 34*  GLUCOSE 124*  BUN 36*  CREATININE 0.83  CALCIUM 10.2   GFR: Estimated Creatinine Clearance: 49.9 mL/min (by C-G formula based on SCr of 0.83 mg/dL). Liver Function Tests: No results for input(s): AST, ALT, ALKPHOS, BILITOT, PROT, ALBUMIN in the last 168 hours. No results for input(s): LIPASE, AMYLASE in the last 168 hours. No results for input(s): AMMONIA in the last 168 hours. Coagulation  Profile: No results for input(s): INR, PROTIME in the last 168 hours. Cardiac Enzymes: No results for input(s): CKTOTAL, CKMB, CKMBINDEX, TROPONINI in the last 168 hours. BNP (last 3 results) No results for input(s): PROBNP in the last 8760 hours. HbA1C: No results for input(s): HGBA1C in the last 72 hours. CBG: Recent Labs  Lab 11/24/17 1129 11/24/17 1617 11/24/17 2154 11/25/17 0753 11/25/17 1124  GLUCAP 126* 182* 134* 131* 121*   Lipid Profile: No results for input(s): CHOL, HDL, LDLCALC, TRIG, CHOLHDL, LDLDIRECT in the last 72 hours. Thyroid Function Tests: No results for input(s): TSH, T4TOTAL, FREET4, T3FREE, THYROIDAB in the last 72 hours. Anemia Panel: No results for input(s): VITAMINB12, FOLATE, FERRITIN, TIBC, IRON, RETICCTPCT in the last 72 hours. Sepsis Labs: No results for input(s): PROCALCITON, LATICACIDVEN in the last 168 hours.  No results found for this or any previous visit (from the past 240 hour(s)).       Radiology Studies: No results found.      Scheduled Meds: . chlorhexidine  15 mL Mouth Rinse BID  . enoxaparin (LOVENOX) injection  30 mg Subcutaneous Q24H  . ferrous sulfate  325 mg Oral Q breakfast  . fluvoxaMINE  150 mg Per Tube QHS  . free water  300 mL Per Tube Q6H  . magnesium hydroxide  30 mL Per Tube Daily  . magnesium oxide  400 mg Per Tube BID  . mouth rinse  15 mL Mouth Rinse q12n4p  . megestrol  400 mg Per Tube Daily  . mirtazapine  45 mg Per Tube QHS  . nystatin  5 mL Oral QID  . OLANZapine zydis  15 mg Sublingual QHS  . OLANZapine zydis  2.5 mg Oral Daily  . phosphorus  500 mg Oral TID WC  . polyethylene glycol  17 g Per Tube BID  . sennosides  5 mL Per Tube QHS  . simvastatin  10 mg Per Tube q1800  . sodium chloride flush  3 mL Intravenous Q12H  . thiamine  100 mg Oral Daily  . vitamin C  250 mg Per Tube BID   Continuous Infusions: . feeding supplement (OSMOLITE 1.2 CAL) 1,000 mL (11/24/17 1824)     LOS: 51 days      Jacquelin Hawkingalph Evelyne Makepeace, MD [facility] 11/25/2017, 12:05 PM  If 7PM-7AM, please contact night-coverage [email or web address]

## 2017-11-26 LAB — GLUCOSE, CAPILLARY
GLUCOSE-CAPILLARY: 128 mg/dL — AB (ref 70–99)
GLUCOSE-CAPILLARY: 92 mg/dL (ref 70–99)
Glucose-Capillary: 90 mg/dL (ref 70–99)
Glucose-Capillary: 90 mg/dL (ref 70–99)

## 2017-11-26 NOTE — Progress Notes (Signed)
PROGRESS NOTE    Amanda Hall  ONG:[id number] DOB: 10-Oct-1950 DOA: 10/05/2017 PCP: [name], MD   Brief Narrative: Amanda Hall is a 67 y.o. female with a history of depression, anxiety.  Patient presents secondary to confusion and agitation.  She is currently awaiting inpatient psychiatry placement for her severe depression and anxiety.   Assessment & Plan:   Principal Problem:   MDD (major depressive disorder), recurrent severe, without psychosis (HCC) Active Problems:   MDD (major depressive disorder)   Anorexia   Dysphagia   Severe malnutrition (HCC)   Anxiety   Pressure injury of skin  Severe depression/anxiety Still requiring inpatient psych placement per psychiatry recommendations -Continue Klonopin 0.5 mg TID prn, Luvox 150 mg qhs, Remeron 45 mg qhs, Zyprexa 15 mg qhs, thiamine -Psychiatry added Zyprexa 2.5 mg daily  Cardiac amyloidosis Suspected on Transthoracic Echocardiogram. -Outpatient follow-up  Sinus tachycardia Resolved.  Severe protein calorie malnutrition Patient is s/p cortrak which was placed 30+ days ago. Discussed with patient, although she does not have capacity, and she is agreeable to placement. Discussed with next of kin, patient's sister, and she is in agreement. -IR consult for PEG  AKI Resolved  Iron deficiency anemia -Continue iron supplementation   Pressure Injury Documentation: Pressure Injury 11/22/17 Stage II -  Partial thickness loss of dermis presenting as a shallow open ulcer with a red, pink wound bed without slough. (Active)  11/22/17 1148   Location: Buttocks  Location Orientation: Left  Staging: Stage II -  Partial thickness loss of dermis presenting as a shallow open ulcer with a red, pink wound bed without slough.  Wound Description (Comments):   Present on Admission: No     DVT prophylaxis: Lovenox Code Status:   Code Status: Full Code Family Communication: None at bedside Disposition Plan: Inpatient  psychiatry   Consultants:   Psychiatry  Cardiology  Palliative care  Procedures:   None  Antimicrobials:  None   Subjective: No issues overnight  Objective: Vitals:   11/25/17 0751 11/25/17 2201 11/26/17 0340 11/26/17 0620  BP: (!) 110/59 96/64 124/67 129/65  Pulse: 80 93 92 84  Resp: 14 16 18 16   Temp: 97.6 F (36.4 C) 98 F (36.7 C) 98.4 F (36.9 C) 98.2 F (36.8 C)  TempSrc: Oral Oral Oral Oral  SpO2: 100% 99% 100% 100%  Weight:      Height:        Intake/Output Summary (Last 24 hours) at 11/26/2017 0904 Last data filed at 11/26/2017 4401 Gross per 24 hour  Intake 2100 ml  Output 2500 ml  Net -400 ml   Filed Weights   11/07/17 0500 11/17/17 1946 11/19/17 2155  Weight: 46.6 kg 47.3 kg 47.4 kg    Examination:  General: Well appearing, no distress   Data Reviewed: I have personally reviewed following labs and imaging studies  CBC: No results for input(s): WBC, NEUTROABS, HGB, HCT, MCV, PLT in the last 168 hours. Basic Metabolic Panel: Recent Labs  Lab 11/20/17 0554 11/25/17 1241  NA 140 139  K 3.5 4.1  CL 101 101  CO2 34* 30  GLUCOSE 124* 135*  BUN 36* 32*  CREATININE 0.83 0.73  CALCIUM 10.2 9.5  MG  --  2.1  PHOS  --  3.2   GFR: Estimated Creatinine Clearance: 51.8 mL/min (by C-G formula based on SCr of 0.73 mg/dL). Liver Function Tests: No results for input(s): AST, ALT, ALKPHOS, BILITOT, PROT, ALBUMIN in the last 168 hours. No results for input(s):  LIPASE, AMYLASE in the last 168 hours. No results for input(s): AMMONIA in the last 168 hours. Coagulation Profile: No results for input(s): INR, PROTIME in the last 168 hours. Cardiac Enzymes: No results for input(s): CKTOTAL, CKMB, CKMBINDEX, TROPONINI in the last 168 hours. BNP (last 3 results) No results for input(s): PROBNP in the last 8760 hours. HbA1C: No results for input(s): HGBA1C in the last 72 hours. CBG: Recent Labs  Lab 11/24/17 1617 11/24/17 2154 11/25/17 0753  11/25/17 1124 11/26/17 0743  GLUCAP 182* 134* 131* 121* 128*   Lipid Profile: No results for input(s): CHOL, HDL, LDLCALC, TRIG, CHOLHDL, LDLDIRECT in the last 72 hours. Thyroid Function Tests: No results for input(s): TSH, T4TOTAL, FREET4, T3FREE, THYROIDAB in the last 72 hours. Anemia Panel: No results for input(s): VITAMINB12, FOLATE, FERRITIN, TIBC, IRON, RETICCTPCT in the last 72 hours. Sepsis Labs: No results for input(s): PROCALCITON, LATICACIDVEN in the last 168 hours.  No results found for this or any previous visit (from the past 240 hour(s)).       Radiology Studies: Ct Abdomen Wo Contrast  Result Date: 11/25/2017 CLINICAL DATA:  Evaluate anatomy for potential percutaneous gastrostomy tube placement. EXAM: CT ABDOMEN WITHOUT CONTRAST TECHNIQUE: Multidetector CT imaging of the abdomen was performed following the standard protocol without IV contrast. COMPARISON:  Acute abdominal radiographic series - 04/18/2017 FINDINGS: Lack of intravenous contrast limits the ability to evaluate solid abdominal organs. The examination is further degraded secondary to patient respiratory artifact. Lower chest: Limited visualization of the lower thorax demonstrates minimal subsegmental atelectasis within the imaged left lower lobe. No discrete focal airspace opacities. No pleural effusion. Normal heart size.  No pericardial effusion. Hepatobiliary: Normal hepatic contour. Normal noncontrast appearance of the gallbladder given degree of distention. No ascites. Pancreas: Normal noncontrast appearance of the pancreas Spleen: Normal noncontrast appearance the spleen Adrenals/Urinary Tract: Normal noncontrast appearance of the bilateral kidneys. No renal stones. No urine obstruction or perinephric stranding. Normal noncontrast appearance the adrenal glands. The urinary bladder was not imaged. Stomach/Bowel: The anterior wall the stomach is well apposed against the ventral wall of the abdomen though note is  made of hypertrophy of the lateral segment left lobe of the liver. Enteric tube is coiled with gastric lumen. No evidence of enteric obstruction. No pneumoperitoneum, pneumatosis or portal venous gas. Vascular/Lymphatic: Atherosclerotic plaque within the abdominal aorta. No definitive bulky retroperitoneal or mesenteric adenopathy on this noncontrast examination. Other: Mild diffuse body wall anasarca. Musculoskeletal: Severe DDD of L4-L5 with disc space height loss, endplate irregularity and small posteriorly directed disc osteophyte complex at this location. IMPRESSION: 1. Gastric anatomy amenable to percutaneous gastrostomy tube placement as indicated though note, there is hypertrophy of the left lobe of the liver and sonographic evaluation to demarcate the liver edge could be performed prior to attempted percutaneous gastrostomy tube placement as deemed appropriate by the performing interventional radiologist. 2.  Aortic Atherosclerosis (ICD10-I70.0). Electronically Signed   By: Simonne ComeJohn  Watts M.D.   On: 11/25/2017 17:01        Scheduled Meds: . chlorhexidine  15 mL Mouth Rinse BID  . enoxaparin (LOVENOX) injection  30 mg Subcutaneous Q24H  . ferrous sulfate  325 mg Oral Q breakfast  . fluvoxaMINE  150 mg Per Tube QHS  . free water  300 mL Per Tube Q6H  . magnesium hydroxide  30 mL Per Tube Daily  . magnesium oxide  400 mg Per Tube BID  . mouth rinse  15 mL Mouth Rinse q12n4p  . megestrol  400 mg Per Tube Daily  . mirtazapine  45 mg Per Tube QHS  . nystatin  5 mL Oral QID  . OLANZapine zydis  15 mg Sublingual QHS  . OLANZapine zydis  2.5 mg Oral Daily  . phosphorus  500 mg Oral TID WC  . polyethylene glycol  17 g Per Tube BID  . sennosides  5 mL Per Tube QHS  . simvastatin  10 mg Per Tube q1800  . sodium chloride flush  3 mL Intravenous Q12H  . thiamine  100 mg Oral Daily  . vitamin C  250 mg Per Tube BID   Continuous Infusions: . feeding supplement (OSMOLITE 1.2 CAL) 1,000 mL  (11/25/17 1646)     LOS: 52 days     Jacquelin Hawking, MD [facility] 11/26/2017, 9:04 AM  If 7PM-7AM, please contact night-coverage [email or web address]

## 2017-11-27 ENCOUNTER — Encounter (HOSPITAL_COMMUNITY): Payer: Self-pay | Admitting: Radiology

## 2017-11-27 LAB — GLUCOSE, CAPILLARY
GLUCOSE-CAPILLARY: 78 mg/dL (ref 70–99)
Glucose-Capillary: 83 mg/dL (ref 70–99)
Glucose-Capillary: 95 mg/dL (ref 70–99)
Glucose-Capillary: 116 mg/dL — ABNORMAL HIGH (ref 70–99)

## 2017-11-27 MED ORDER — ENOXAPARIN SODIUM 30 MG/0.3ML ~~LOC~~ SOLN: 30.0000 mg | SUBCUTANEOUS | Status: DC

## 2017-11-27 MED ORDER — ENOXAPARIN SODIUM 30 MG/0.3ML ~~LOC~~ SOLN
30.0000 mg | SUBCUTANEOUS | Status: DC
  Administered 2017-12-01 – 2017-12-28 (×27): 30 mg via SUBCUTANEOUS
  Filled 2017-11-27 (×27): qty 0.3

## 2017-11-27 NOTE — Progress Notes (Addendum)
Chief Complaint: Patient was seen in consultation today for gastrostomy tube placement request at the request of Dr. Jacquelin Hawking  Referring Physician(s): Dr. Jacquelin Hawking  Supervising Physician: Richarda Overlie  Patient Status: [facility] - In-pt  History of Present Illness: Amanda Hall is a 67 y.o. female with major depressive disorder, anorexia, and protein calorie malnutrition. She is to be admitted to an inpatient psychiatric facility for rehab. She currently has an NGT through which she is receiving TF. She is also able to drink some Ensure at times but her po intake is very poor. The primary team has discussed with the pt and her sister that a percutaneous gastrostomy tube may be a good option for her to continue to receive her nutritional needs while she is at the rehab. The hope is that he overall condition will improve and that she will resume eating and maintaining caloric intake by mouth. IR is asked to eval for perc g tube placement. PMHx, meds, labs, imaging, allergies reviewed. The pt has been declared NOT competent to make decisions for herself but I do think she understands the issue and seems agreeable to placement of the tube. Discussed situation and role/goal of G-tube with pt sister. She is 100% agreeable and also understands that ultimately the tube can be removed.   Past Medical History:  Diagnosis Date  . Allergy   . Anemia   . Anxiety   . Depression   . Hemorrhoids     Past Surgical History:  Procedure Laterality Date  . arm surgery     Left  . TUBAL LIGATION      Allergies: Codeine; Sulfonamide derivatives; and Tetanus toxoid  Medications:  Current Facility-Administered Medications:  .  acetaminophen (TYLENOL) tablet 650 mg, 650 mg, Oral, Q6H PRN, 650 mg at 11/16/17 2356 **OR** acetaminophen (TYLENOL) suppository 650 mg, 650 mg, Rectal, Q6H PRN, Standley Brooking, MD .  chlorhexidine (PERIDEX) 0.12 % solution 15 mL, 15 mL, Mouth Rinse, BID, Hall, Carole  N, DO, 15 mL at 11/26/17 2121 .  clonazePAM (KLONOPIN) tablet 0.5 mg, 0.5 mg, Per Tube, TID PRN, Tyrone Nine, MD, 0.5 mg at 11/27/17 0828 .  enoxaparin (LOVENOX) injection 30 mg, 30 mg, Subcutaneous, Q24H, Standley Brooking, MD, 30 mg at 11/26/17 1119 .  feeding supplement (OSMOLITE 1.2 CAL) liquid 1,000 mL, 1,000 mL, Per Tube, Continuous, Narda Bonds, MD, Last Rate: 60 mL/hr at 11/25/17 1646, 1,000 mL at 11/25/17 1646 .  ferrous sulfate tablet 325 mg, 325 mg, Oral, Q breakfast, Dow Adolph N, DO, 325 mg at 11/27/17 6440 .  fluvoxaMINE (LUVOX) tablet 150 mg, 150 mg, Per Tube, QHS, Blount, Xenia T, NP, 150 mg at 11/26/17 2121 .  free water 300 mL, 300 mL, Per Tube, Q6H, Hall, Carole N, DO, 300 mL at 11/27/17 0640 .  labetalol (NORMODYNE,TRANDATE) injection 5 mg, 5 mg, Intravenous, Q2H PRN, Jerald Kief, MD, 5 mg at 11/04/17 1133 .  loperamide (IMODIUM) capsule 2 mg, 2 mg, Oral, PRN, Jerald Kief, MD .  magnesium hydroxide (MILK OF MAGNESIA) suspension 30 mL, 30 mL, Per Tube, Daily, Blount, Xenia T, NP, 30 mL at 11/26/17 1118 .  magnesium oxide (MAG-OX) tablet 400 mg, 400 mg, Per Tube, BID, Regalado, Belkys A, MD, 400 mg at 11/26/17 2123 .  MEDLINE mouth rinse, 15 mL, Mouth Rinse, q12n4p, Hall, Carole N, DO, 15 mL at 11/26/17 1811 .  megestrol (MEGACE) 400 MG/10ML suspension 400 mg, 400 mg, Per Tube, Daily, Blount,  Janalyn RouseXenia T, NP, 400 mg at 11/26/17 1118 .  milk and molasses enema, 1 enema, Rectal, Daily PRN, Regalado, Belkys A, MD .  mirtazapine (REMERON SOL-TAB) disintegrating tablet 45 mg, 45 mg, Per Tube, QHS, Blount, Xenia T, NP, 45 mg at 11/26/17 2121 .  naphazoline-glycerin (CLEAR EYES REDNESS) ophth solution 2 drop, 2 drop, Both Eyes, QID PRN, Standley BrookingGoodrich, Daniel P, MD .  nystatin (MYCOSTATIN) 100000 UNIT/ML suspension 500,000 Units, 5 mL, Oral, QID, Darlin DropHall, Carole N, DO, 500,000 Units at 11/26/17 2122 .  OLANZapine zydis (ZYPREXA) disintegrating tablet 15 mg, 15 mg, Sublingual, QHS,  Hall, Carole N, DO, 15 mg at 11/26/17 2121 .  OLANZapine zydis (ZYPREXA) disintegrating tablet 2.5 mg, 2.5 mg, Oral, Daily, Cherly Beachorman, Jacqueline J, DO, 2.5 mg at 11/26/17 1119 .  ondansetron (ZOFRAN) tablet 4 mg, 4 mg, Per Tube, Q6H PRN **OR** ondansetron (ZOFRAN) injection 4 mg, 4 mg, Intravenous, Q6H PRN, Blount, Xenia T, NP, 4 mg at 11/15/17 0457 .  phosphorus (K PHOS NEUTRAL) tablet 500 mg, 500 mg, Oral, TID WC, Tyrone NineGrunz, Ryan B, MD, 500 mg at 11/27/17 0828 .  polyethylene glycol (MIRALAX / GLYCOLAX) packet 17 g, 17 g, Per Tube, BID, Blount, Xenia T, NP, 17 g at 11/26/17 2120 .  sennosides (SENOKOT) 8.8 MG/5ML syrup 5 mL, 5 mL, Per Tube, QHS, Blount, Xenia T, NP, 5 mL at 11/26/17 2122 .  simvastatin (ZOCOR) tablet 10 mg, 10 mg, Per Tube, q1800, Blount, Xenia T, NP, 10 mg at 11/26/17 1810 .  sodium chloride flush (NS) 0.9 % injection 3 mL, 3 mL, Intravenous, Q12H, Standley BrookingGoodrich, Daniel P, MD, 3 mL at 11/26/17 2120 .  thiamine (VITAMIN B-1) tablet 100 mg, 100 mg, Oral, Daily, Hazeline JunkerGrunz, Ryan B, MD, 100 mg at 11/26/17 1120 .  vitamin C (ASCORBIC ACID) tablet 250 mg, 250 mg, Per Tube, BID, Tyrone NineGrunz, Ryan B, MD, 250 mg at 11/26/17 2122    Family History  Problem Relation Age of Onset  . Stroke Mother   . Hypertension Sister   . Cancer Neg Hx        breat and colon hx  . Diabetes Neg Hx        family    Social History   Socioeconomic History  . Marital status: Divorced    Spouse name: Not on file  . Number of children: Not on file  . Years of education: Not on file  . Highest education level: Not on file  Occupational History  . Not on file  Social Needs  . Financial resource strain: Not on file  . Food insecurity:    Worry: Not on file    Inability: Not on file  . Transportation needs:    Medical: Not on file    Non-medical: Not on file  Tobacco Use  . Smoking status: Never Smoker  . Smokeless tobacco: Never Used  Substance and Sexual Activity  . Alcohol use: No  . Drug use: No  . Sexual  activity: Not Currently  Lifestyle  . Physical activity:    Days per week: Not on file    Minutes per session: Not on file  . Stress: Not on file  Relationships  . Social connections:    Talks on phone: Not on file    Gets together: Not on file    Attends religious service: Not on file    Active member of club or organization: Not on file    Attends meetings of clubs or organizations: Not on file  Relationship status: Not on file  Other Topics Concern  . Not on file  Social History Narrative  . Not on file     Review of Systems: A 12 point ROS discussed and pertinent positives are indicated in the HPI above.  All other systems are negative.  Review of Systems  Vital Signs: BP (!) 141/93   Pulse (!) 102   Temp 97.8 F (36.6 C) (Oral)   Resp 17   Ht 5\' 2"  (1.575 m)   Wt 47.4 kg   SpO2 100%   BMI 19.11 kg/m   Physical Exam  Constitutional:  NAD, but pt very frail and cachectic appearing.  HENT:  Head: Normocephalic.  Mouth/Throat: Oropharynx is clear and moist.  Neck: Normal range of motion. No JVD present. No tracheal deviation present.  Cardiovascular: Normal rate, regular rhythm and normal heart sounds.  Pulmonary/Chest: Effort normal and breath sounds normal. No respiratory distress.  Abdominal: Soft. She exhibits no distension and no mass. There is no tenderness.  Skin: Skin is warm and dry.      Imaging: Ct Abdomen Wo Contrast  Result Date: 11/25/2017 CLINICAL DATA:  Evaluate anatomy for potential percutaneous gastrostomy tube placement. EXAM: CT ABDOMEN WITHOUT CONTRAST TECHNIQUE: Multidetector CT imaging of the abdomen was performed following the standard protocol without IV contrast. COMPARISON:  Acute abdominal radiographic series - 04/18/2017 FINDINGS: Lack of intravenous contrast limits the ability to evaluate solid abdominal organs. The examination is further degraded secondary to patient respiratory artifact. Lower chest: Limited visualization of the  lower thorax demonstrates minimal subsegmental atelectasis within the imaged left lower lobe. No discrete focal airspace opacities. No pleural effusion. Normal heart size.  No pericardial effusion. Hepatobiliary: Normal hepatic contour. Normal noncontrast appearance of the gallbladder given degree of distention. No ascites. Pancreas: Normal noncontrast appearance of the pancreas Spleen: Normal noncontrast appearance the spleen Adrenals/Urinary Tract: Normal noncontrast appearance of the bilateral kidneys. No renal stones. No urine obstruction or perinephric stranding. Normal noncontrast appearance the adrenal glands. The urinary bladder was not imaged. Stomach/Bowel: The anterior wall the stomach is well apposed against the ventral wall of the abdomen though note is made of hypertrophy of the lateral segment left lobe of the liver. Enteric tube is coiled with gastric lumen. No evidence of enteric obstruction. No pneumoperitoneum, pneumatosis or portal venous gas. Vascular/Lymphatic: Atherosclerotic plaque within the abdominal aorta. No definitive bulky retroperitoneal or mesenteric adenopathy on this noncontrast examination. Other: Mild diffuse body wall anasarca. Musculoskeletal: Severe DDD of L4-L5 with disc space height loss, endplate irregularity and small posteriorly directed disc osteophyte complex at this location. IMPRESSION: 1. Gastric anatomy amenable to percutaneous gastrostomy tube placement as indicated though note, there is hypertrophy of the left lobe of the liver and sonographic evaluation to demarcate the liver edge could be performed prior to attempted percutaneous gastrostomy tube placement as deemed appropriate by the performing interventional radiologist. 2.  Aortic Atherosclerosis (ICD10-I70.0). Electronically Signed   By: Simonne Come M.D.   On: 11/25/2017 17:01    Labs:  CBC: Recent Labs    10/22/17 0629 10/23/17 0648 10/24/17 0834 11/12/17 0712  WBC 6.2 5.8 5.6 7.3  HGB 12.4  11.2* 11.9* 12.0  HCT 37.8 36.6 37.5 38.6  PLT 304 268 286 389    COAGS: No results for input(s): INR, APTT in the last 8760 hours.  BMP: Recent Labs    11/08/17 0638 11/12/17 0712 11/20/17 0554 11/25/17 1241  NA 143 140 140 139  K 3.8 3.9 3.5  4.1  CL 109 107 101 101  CO2 29 29 34* 30  GLUCOSE 136* 147* 124* 135*  BUN 13 20 36* 32*  CALCIUM 9.1 9.7 10.2 9.5  CREATININE 0.49 0.58 0.83 0.73  GFRNONAA >60 >60 >60 >60  GFRAA >60 >60 >60 >60    LIVER FUNCTION TESTS: Recent Labs    09/07/17 0932 10/05/17 1813 10/12/17 0401 11/12/17 0712  BILITOT 0.2* 1.1 0.5 0.5  AST 25 41 33 19  ALT 17 26 22 21   ALKPHOS 60 85 61 74  PROT 4.8* 6.7 5.4* 6.0*  ALBUMIN 2.8* 3.7 2.9* 2.7*    Assessment and Plan: FTT/PCM Major depressive disorder CT reviewed, should be acceptable window for percutaneous placement. Will give barium via Cortrak tonight and check KUB in am prior to procedure. Labs reviewed. Hold Lovenox. Risks and benefits discussed with the patient and her sister including, but not limited to the need for a barium enema during the procedure, bleeding, infection, peritonitis, or damage to adjacent structures.  All questions were answered, patient and sister agreeable to proceed. Consent signed and in chart.    Thank you for this interesting consult.  I greatly enjoyed meeting Amanda Hall and look forward to participating in their care.  A copy of this report was sent to the requesting provider on this date.  Electronically Signed: Brayton El, PA-C 11/27/2017, 9:53 AM   I spent a total of 20 minutes in face to face in clinical consultation, greater than 50% of which was counseling/coordinating care for g-tube placement

## 2017-11-27 NOTE — Progress Notes (Signed)
PROGRESS NOTE    Amanda BreezeDana Benedicto  ZOX:[id number] DOB: 06-19-1950 DOA: 10/05/2017 PCP: Gordy SaversKwiatkowski, Peter F, MD   Brief Narrative: Amanda Hall is a 67 y.o. female with a history of depression, anxiety.  Patient presents secondary to confusion and agitation.  She is currently awaiting inpatient psychiatry placement for her severe depression and anxiety.   Assessment & Plan:   Principal Problem:   MDD (major depressive disorder), recurrent severe, without psychosis (HCC) Active Problems:   MDD (major depressive disorder)   Anorexia   Dysphagia   Severe malnutrition (HCC)   Anxiety   Pressure injury of skin  Severe depression/anxiety Still requiring inpatient psych placement per psychiatry recommendations -Continue Klonopin 0.5 mg TID prn, Luvox 150 mg qhs, Remeron 45 mg qhs, Zyprexa 2.5 mg daily and 15 mg qhs, thiamine, continue sitter  Cardiac amyloidosis Suspected on Transthoracic Echocardiogram. -Outpatient follow-up  Sinus tachycardia Resolved.  Severe protein calorie malnutrition Patient is s/p cortrak which was placed 30+ days ago. Discussed with patient, although she does not have capacity, and she is agreeable to placement. Discussed with next of kin, patient's sister, and she is in agreement. -IR consult for PEG; plan to place 11/18  AKI Resolved  Iron deficiency anemia -Continue iron supplementation   Pressure Injury Documentation: Pressure Injury 11/22/17 Stage II -  Partial thickness loss of dermis presenting as a shallow open ulcer with a red, pink wound bed without slough. (Active)  11/22/17 1148   Location: Buttocks  Location Orientation: Left  Staging: Stage II -  Partial thickness loss of dermis presenting as a shallow open ulcer with a red, pink wound bed without slough.  Wound Description (Comments):   Present on Admission: No     DVT prophylaxis: Lovenox Code Status:   Code Status: Full Code Family Communication: None at bedside Disposition Plan:  Inpatient psychiatry   Consultants:   Psychiatry  Cardiology  Palliative care  Procedures:   None  Antimicrobials:  None   Subjective: Asking about procedure tomorrow. Anxious.  Objective: Vitals:   11/26/17 1701 11/26/17 2218 11/27/17 0618 11/27/17 0940  BP: 112/68 109/63 136/76 (!) 141/93  Pulse: 94 90 (!) 104 (!) 102  Resp: 17 16 17    Temp: (!) 97.5 F (36.4 C) (!) 97.5 F (36.4 C) 97.7 F (36.5 C) 97.8 F (36.6 C)  TempSrc: Axillary Axillary Oral Oral  SpO2: 100% 100% 100% 100%  Weight:      Height:        Intake/Output Summary (Last 24 hours) at 11/27/2017 1030 Last data filed at 11/27/2017 0600 Gross per 24 hour  Intake 0 ml  Output 801 ml  Net -801 ml   Filed Weights   11/07/17 0500 11/17/17 1946 11/19/17 2155  Weight: 46.6 kg 47.3 kg 47.4 kg    Examination:  General exam: Appears anxious but in no distress, cachectic  Respiratory system: Clear to auscultation. Respiratory effort normal. Cardiovascular system: S1 & S2 heard, RRR. No murmurs, rubs, gallops or clicks. Gastrointestinal system: Abdomen is nondistended, soft and nontender. No organomegaly or masses felt. Normal bowel sounds heard. Central nervous system: Alert and oriented. Tremor Extremities: No edema. No calf tenderness Skin: No cyanosis. No rashes Psychiatry: Anxious, flat affect, normal speech   Data Reviewed: I have personally reviewed following labs and imaging studies  CBC: No results for input(s): WBC, NEUTROABS, HGB, HCT, MCV, PLT in the last 168 hours. Basic Metabolic Panel: Recent Labs  Lab 11/25/17 1241  NA 139  K 4.1  CL  101  CO2 30  GLUCOSE 135*  BUN 32*  CREATININE 0.73  CALCIUM 9.5  MG 2.1  PHOS 3.2   GFR: Estimated Creatinine Clearance: 51.8 mL/min (by C-G formula based on SCr of 0.73 mg/dL). Liver Function Tests: No results for input(s): AST, ALT, ALKPHOS, BILITOT, PROT, ALBUMIN in the last 168 hours. No results for input(s): LIPASE, AMYLASE in  the last 168 hours. No results for input(s): AMMONIA in the last 168 hours. Coagulation Profile: No results for input(s): INR, PROTIME in the last 168 hours. Cardiac Enzymes: No results for input(s): CKTOTAL, CKMB, CKMBINDEX, TROPONINI in the last 168 hours. BNP (last 3 results) No results for input(s): PROBNP in the last 8760 hours. HbA1C: No results for input(s): HGBA1C in the last 72 hours. CBG: Recent Labs  Lab 11/26/17 0743 11/26/17 1118 11/26/17 1700 11/26/17 2221 11/27/17 0737  GLUCAP 128* 90 90 92 83   Lipid Profile: No results for input(s): CHOL, HDL, LDLCALC, TRIG, CHOLHDL, LDLDIRECT in the last 72 hours. Thyroid Function Tests: No results for input(s): TSH, T4TOTAL, FREET4, T3FREE, THYROIDAB in the last 72 hours. Anemia Panel: No results for input(s): VITAMINB12, FOLATE, FERRITIN, TIBC, IRON, RETICCTPCT in the last 72 hours. Sepsis Labs: No results for input(s): PROCALCITON, LATICACIDVEN in the last 168 hours.  No results found for this or any previous visit (from the past 240 hour(s)).       Radiology Studies: Ct Abdomen Wo Contrast  Result Date: 11/25/2017 CLINICAL DATA:  Evaluate anatomy for potential percutaneous gastrostomy tube placement. EXAM: CT ABDOMEN WITHOUT CONTRAST TECHNIQUE: Multidetector CT imaging of the abdomen was performed following the standard protocol without IV contrast. COMPARISON:  Acute abdominal radiographic series - 04/18/2017 FINDINGS: Lack of intravenous contrast limits the ability to evaluate solid abdominal organs. The examination is further degraded secondary to patient respiratory artifact. Lower chest: Limited visualization of the lower thorax demonstrates minimal subsegmental atelectasis within the imaged left lower lobe. No discrete focal airspace opacities. No pleural effusion. Normal heart size.  No pericardial effusion. Hepatobiliary: Normal hepatic contour. Normal noncontrast appearance of the gallbladder given degree of  distention. No ascites. Pancreas: Normal noncontrast appearance of the pancreas Spleen: Normal noncontrast appearance the spleen Adrenals/Urinary Tract: Normal noncontrast appearance of the bilateral kidneys. No renal stones. No urine obstruction or perinephric stranding. Normal noncontrast appearance the adrenal glands. The urinary bladder was not imaged. Stomach/Bowel: The anterior wall the stomach is well apposed against the ventral wall of the abdomen though note is made of hypertrophy of the lateral segment left lobe of the liver. Enteric tube is coiled with gastric lumen. No evidence of enteric obstruction. No pneumoperitoneum, pneumatosis or portal venous gas. Vascular/Lymphatic: Atherosclerotic plaque within the abdominal aorta. No definitive bulky retroperitoneal or mesenteric adenopathy on this noncontrast examination. Other: Mild diffuse body wall anasarca. Musculoskeletal: Severe DDD of L4-L5 with disc space height loss, endplate irregularity and small posteriorly directed disc osteophyte complex at this location. IMPRESSION: 1. Gastric anatomy amenable to percutaneous gastrostomy tube placement as indicated though note, there is hypertrophy of the left lobe of the liver and sonographic evaluation to demarcate the liver edge could be performed prior to attempted percutaneous gastrostomy tube placement as deemed appropriate by the performing interventional radiologist. 2.  Aortic Atherosclerosis (ICD10-I70.0). Electronically Signed   By: Simonne Come M.D.   On: 11/25/2017 17:01        Scheduled Meds: . chlorhexidine  15 mL Mouth Rinse BID  . [START ON 11/29/2017] enoxaparin (LOVENOX) injection  30 mg  Subcutaneous Q24H  . ferrous sulfate  325 mg Oral Q breakfast  . fluvoxaMINE  150 mg Per Tube QHS  . free water  300 mL Per Tube Q6H  . magnesium hydroxide  30 mL Per Tube Daily  . magnesium oxide  400 mg Per Tube BID  . mouth rinse  15 mL Mouth Rinse q12n4p  . megestrol  400 mg Per Tube Daily    . mirtazapine  45 mg Per Tube QHS  . nystatin  5 mL Oral QID  . OLANZapine zydis  15 mg Sublingual QHS  . OLANZapine zydis  2.5 mg Oral Daily  . phosphorus  500 mg Oral TID WC  . polyethylene glycol  17 g Per Tube BID  . sennosides  5 mL Per Tube QHS  . simvastatin  10 mg Per Tube q1800  . sodium chloride flush  3 mL Intravenous Q12H  . thiamine  100 mg Oral Daily  . vitamin C  250 mg Per Tube BID   Continuous Infusions: . feeding supplement (OSMOLITE 1.2 CAL) 1,000 mL (11/25/17 1646)     LOS: 53 days     Jacquelin Hawking, MD [facility] 11/27/2017, 10:30 AM  If 7PM-7AM, please contact night-coverage [email or web address]

## 2017-11-28 ENCOUNTER — Inpatient Hospital Stay (HOSPITAL_COMMUNITY): Payer: Medicare Other

## 2017-11-28 ENCOUNTER — Encounter (HOSPITAL_COMMUNITY): Payer: Self-pay | Admitting: Interventional Radiology

## 2017-11-28 HISTORY — PX: IR GASTROSTOMY TUBE MOD SED: IMG625

## 2017-11-28 LAB — GLUCOSE, CAPILLARY
GLUCOSE-CAPILLARY: 86 mg/dL (ref 70–99)
GLUCOSE-CAPILLARY: 82 mg/dL (ref 70–99)
GLUCOSE-CAPILLARY: 83 mg/dL (ref 70–99)
Glucose-Capillary: 70 mg/dL (ref 70–99)

## 2017-11-28 LAB — PROTIME-INR
INR: 1.05
Prothrombin Time: 13.6 seconds (ref 11.4–15.2)

## 2017-11-28 MED ORDER — IOPAMIDOL (ISOVUE-300) INJECTION 61%
INTRAVENOUS | Status: AC
  Administered 2017-11-28: 20 mL
  Filled 2017-11-28: qty 50

## 2017-11-28 MED ORDER — LIDOCAINE HCL 1 % IJ SOLN
INTRAMUSCULAR | Status: AC
  Filled 2017-11-28: qty 20

## 2017-11-28 MED ORDER — FENTANYL CITRATE (PF) 100 MCG/2ML IJ SOLN
INTRAMUSCULAR | Status: AC
  Filled 2017-11-28: qty 4

## 2017-11-28 MED ORDER — FENTANYL CITRATE (PF) 100 MCG/2ML IJ SOLN
INTRAMUSCULAR | Status: AC | PRN
  Administered 2017-11-28: 50 ug via INTRAVENOUS
  Administered 2017-11-28: 25 ug via INTRAVENOUS

## 2017-11-28 MED ORDER — CEFAZOLIN SODIUM-DEXTROSE 1-4 GM/50ML-% IV SOLN
1.0000 g | INTRAVENOUS | Status: AC
  Filled 2017-11-28: qty 50

## 2017-11-28 MED ORDER — CEFAZOLIN (ANCEF) 1 G IV SOLR: 1.0000 g | INTRAVENOUS | Status: DC

## 2017-11-28 MED ORDER — GLUCAGON HCL RDNA (DIAGNOSTIC) 1 MG IJ SOLR
INTRAMUSCULAR | Status: AC | PRN
  Administered 2017-11-28: .5 mg via INTRAVENOUS

## 2017-11-28 MED ORDER — GLUCAGON HCL RDNA (DIAGNOSTIC) 1 MG IJ SOLR
INTRAMUSCULAR | Status: AC
  Filled 2017-11-28: qty 1

## 2017-11-28 MED ORDER — LIDOCAINE HCL 1 % IJ SOLN
INTRAMUSCULAR | Status: AC | PRN
  Administered 2017-11-28: 5 mL

## 2017-11-28 MED ORDER — MIDAZOLAM HCL 2 MG/2ML IJ SOLN
INTRAMUSCULAR | Status: AC | PRN
  Administered 2017-11-28: 0.5 mg via INTRAVENOUS
  Administered 2017-11-28: 1 mg via INTRAVENOUS

## 2017-11-28 MED ORDER — CEFAZOLIN SODIUM-DEXTROSE 2-4 GM/100ML-% IV SOLN
INTRAVENOUS | Status: AC
  Filled 2017-11-28: qty 100

## 2017-11-28 MED ORDER — MIDAZOLAM HCL 2 MG/2ML IJ SOLN
INTRAMUSCULAR | Status: AC
  Filled 2017-11-28: qty 4

## 2017-11-28 MED ORDER — HYDROCODONE-ACETAMINOPHEN 5-325 MG PO TABS
1.0000 | ORAL_TABLET | Freq: Four times a day (QID) | ORAL | Status: DC | PRN
  Administered 2017-11-28: 2 via ORAL
  Administered 2017-11-29 – 2017-12-04 (×5): 1 via ORAL
  Administered 2017-12-06: 2 via ORAL
  Administered 2017-12-07: 1 via ORAL
  Administered 2017-12-08 (×2): 2 via ORAL
  Administered 2017-12-09 – 2017-12-16 (×7): 1 via ORAL
  Administered 2017-12-16: 2 via ORAL
  Administered 2017-12-17 – 2017-12-18 (×4): 1 via ORAL
  Administered 2017-12-21 – 2017-12-25 (×5): 2 via ORAL
  Administered 2017-12-26: 1 via ORAL
  Administered 2017-12-26: 2 via ORAL
  Administered 2017-12-27 – 2017-12-29 (×7): 1 via ORAL
  Administered 2017-12-29: 2 via ORAL
  Administered 2017-12-30 – 2017-12-31 (×4): 1 via ORAL
  Filled 2017-11-28: qty 1
  Filled 2017-11-28: qty 2
  Filled 2017-11-28 (×2): qty 1
  Filled 2017-11-28 (×3): qty 2
  Filled 2017-11-28 (×2): qty 1
  Filled 2017-11-28: qty 2
  Filled 2017-11-28: qty 1
  Filled 2017-11-28: qty 2
  Filled 2017-11-28 (×13): qty 1
  Filled 2017-11-28: qty 2
  Filled 2017-11-28 (×3): qty 1
  Filled 2017-11-28: qty 2
  Filled 2017-11-28: qty 1
  Filled 2017-11-28: qty 2
  Filled 2017-11-28 (×2): qty 1
  Filled 2017-11-28: qty 2
  Filled 2017-11-28 (×2): qty 1
  Filled 2017-11-28: qty 2
  Filled 2017-11-28: qty 1
  Filled 2017-11-28: qty 2
  Filled 2017-11-28 (×3): qty 1

## 2017-11-28 MED ORDER — SODIUM CHLORIDE 0.9 % IV SOLN
INTRAVENOUS | Status: AC | PRN
  Administered 2017-11-28: 10 mL/h via INTRAVENOUS

## 2017-11-28 NOTE — Progress Notes (Signed)
PROGRESS NOTE    Amanda Hall  WUJ:[id number] DOB: 1950-03-12 DOA: 10/05/2017 PCP: [name], MD   Brief Narrative: Amanda Hall is a 67 y.o. female with a history of depression, anxiety.  Patient presents secondary to confusion and agitation.  She is currently awaiting inpatient psychiatry placement for her severe depression and anxiety. G tube placed 11/18.   Assessment & Plan:   Principal Problem:   MDD (major depressive disorder), recurrent severe, without psychosis (HCC) Active Problems:   MDD (major depressive disorder)   Anorexia   Dysphagia   Severe malnutrition (HCC)   Anxiety   Pressure injury of skin  Severe depression/anxiety Still requiring inpatient psych placement per psychiatry recommendations -Continue Klonopin 0.5 mg TID prn, Luvox 150 mg qhs, Remeron 45 mg qhs, Zyprexa 2.5 mg daily and 15 mg qhs, thiamine, continue sitter  Cardiac amyloidosis Suspected on Transthoracic Echocardiogram. -Outpatient follow-up  Sinus tachycardia Resolved.  Severe protein calorie malnutrition Patient is s/p cortrak which was placed 30+ days ago. Discussed with patient, although she does not have capacity, and she is agreeable to placement. Discussed with next of kin, patient's sister, and she is in agreement. S/p G-tube placement on 11/18. -Tube feeds tomorrow  AKI Resolved  Iron deficiency anemia -Continue iron supplementation   Pressure Injury Documentation: Pressure Injury 11/22/17 Stage II -  Partial thickness loss of dermis presenting as a shallow open ulcer with a red, pink wound bed without slough. (Active)  11/22/17 1148   Location: Buttocks  Location Orientation: Left  Staging: Stage II -  Partial thickness loss of dermis presenting as a shallow open ulcer with a red, pink wound bed without slough.  Wound Description (Comments):   Present on Admission: No     DVT prophylaxis: Lovenox Code Status:   Code Status: Full Code Family Communication:  None at bedside Disposition Plan: Inpatient psychiatry, interventional radiology   Consultants:   Psychiatry  Cardiology  Palliative care  Procedures:   None  Antimicrobials:  None   Subjective: No issues overnight  Objective: Vitals:   11/28/17 0915 11/28/17 1006 11/28/17 1036 11/28/17 1108  BP: 127/79 133/76 113/62 121/73  Pulse: 90 95 74 92  Resp: 15 16 17 17   Temp:  98.2 F (36.8 C)  97.7 F (36.5 C)  TempSrc:  Oral  Oral  SpO2: 99% 100% 97% 99%  Weight:      Height:        Intake/Output Summary (Last 24 hours) at 11/28/2017 1116 Last data filed at 11/28/2017 0600 Gross per 24 hour  Intake 480 ml  Output 951 ml  Net -471 ml   Filed Weights   11/07/17 0500 11/17/17 1946 11/19/17 2155  Weight: 46.6 kg 47.3 kg 47.4 kg    Examination:  General: Well appearing, no distress Psych: Very anxious   Data Reviewed: I have personally reviewed following labs and imaging studies  CBC: No results for input(s): WBC, NEUTROABS, HGB, HCT, MCV, PLT in the last 168 hours. Basic Metabolic Panel: Recent Labs  Lab 11/25/17 1241  NA 139  K 4.1  CL 101  CO2 30  GLUCOSE 135*  BUN 32*  CREATININE 0.73  CALCIUM 9.5  MG 2.1  PHOS 3.2   GFR: Estimated Creatinine Clearance: 51.8 mL/min (by C-G formula based on SCr of 0.73 mg/dL). Liver Function Tests: No results for input(s): AST, ALT, ALKPHOS, BILITOT, PROT, ALBUMIN in the last 168 hours. No results for input(s): LIPASE, AMYLASE in the last 168 hours. No results for  input(s): AMMONIA in the last 168 hours. Coagulation Profile: Recent Labs  Lab 11/28/17 0514  INR 1.05   Cardiac Enzymes: No results for input(s): CKTOTAL, CKMB, CKMBINDEX, TROPONINI in the last 168 hours. BNP (last 3 results) No results for input(s): PROBNP in the last 8760 hours. HbA1C: No results for input(s): HGBA1C in the last 72 hours. CBG: Recent Labs  Lab 11/27/17 0737 11/27/17 1139 11/27/17 1619 11/27/17 2151  11/28/17 0752  GLUCAP 83 95 116* 78 86   Lipid Profile: No results for input(s): CHOL, HDL, LDLCALC, TRIG, CHOLHDL, LDLDIRECT in the last 72 hours. Thyroid Function Tests: No results for input(s): TSH, T4TOTAL, FREET4, T3FREE, THYROIDAB in the last 72 hours. Anemia Panel: No results for input(s): VITAMINB12, FOLATE, FERRITIN, TIBC, IRON, RETICCTPCT in the last 72 hours. Sepsis Labs: No results for input(s): PROCALCITON, LATICACIDVEN in the last 168 hours.  No results found for this or any previous visit (from the past 240 hour(s)).       Radiology Studies: Dg Abd 1 View  Result Date: 11/28/2017 CLINICAL DATA:  Evaluate anatomy prior to potential percutaneous gastrostomy tube placement. EXAM: ABDOMEN - 1 VIEW COMPARISON:  CT abdomen pelvis-11/25/2017 FINDINGS: Enteric contrast is seen within the distal small bowel and throughout the colon. There is no significant residual enteric contrast seen within the gastric lumen. No evidence of enteric obstruction. No supine evidence of pneumoperitoneum. No pneumatosis or portal venous gas. Atherosclerotic plaque within the abdominal aorta. No definitive abnormal intra-abdominal calcifications given overlying enteric barium. Weighted tip enteric tube overlies expected location of the gastric fundus. Limited visualization of the lower thorax demonstrates left basilar/retrocardiac heterogeneous opacities, likely atelectasis. No acute osseus abnormalities. IMPRESSION: Enteric contrast seen throughout the transverse colon. No evidence of enteric obstruction Electronically Signed   By: Simonne Come M.D.   On: 11/28/2017 08:03   Ir Gastrostomy Tube Mod Sed  Result Date: 11/28/2017 INDICATION: Dysphagia, malnutrition EXAM: FLUOROSCOPIC 20 FRENCH PULL-THROUGH GASTROSTOMY Date:  11/28/2017 11/28/2017 9:16 am Radiologist:  Judie Petit. Ruel Favors, MD Guidance:  Fluoroscopic MEDICATIONS: 1 g Ancef; Antibiotics were administered within 1 hour of the procedure. Glucagon  0.5 mg IV ANESTHESIA/SEDATION: Versed 1.5 mg IV; Fentanyl 75 mcg IV Moderate Sedation Time:  16 minutes The patient was continuously monitored during the procedure by the interventional radiology nurse under my direct supervision. CONTRAST:  10 cc-administered into the gastric lumen. FLUOROSCOPY TIME:  Fluoroscopy Time: 4 minutes 30 seconds (8 mGy). COMPLICATIONS: None immediate. PROCEDURE: Informed consent was obtained from the patient following explanation of the procedure, risks, benefits and alternatives. The patient understands, agrees and consents for the procedure. All questions were addressed. A time out was performed. Maximal barrier sterile technique utilized including caps, mask, sterile gowns, sterile gloves, large sterile drape, hand hygiene, and betadine prep. The left upper quadrant was sterilely prepped and draped. An oral gastric catheter was inserted into the stomach under fluoroscopy. The existing nasogastric feeding tube was removed. Air was injected into the stomach for insufflation and visualization under fluoroscopy. The air distended stomach was confirmed beneath the anterior abdominal wall in the frontal and lateral projections. Under sterile conditions and local anesthesia, a 17 gauge trocar needle was utilized to access the stomach percutaneously beneath the left subcostal margin. Needle position was confirmed within the stomach under biplane fluoroscopy. Contrast injection confirmed position also. A single T tack was deployed for gastropexy. Over an Amplatz guide wire, a 9-French sheath was inserted into the stomach. A snare device was utilized to capture the oral  gastric catheter. The snare device was pulled retrograde from the stomach up the esophagus and out the oropharynx. The 20-French pull-through gastrostomy was connected to the snare device and pulled antegrade through the oropharynx down the esophagus into the stomach and then through the percutaneous tract external to the  patient. The gastrostomy was assembled externally. Contrast injection confirms position in the stomach. Images were obtained for documentation. The patient tolerated procedure well. No immediate complication. IMPRESSION: Fluoroscopic insertion of a 20-French "pull-through" gastrostomy. Electronically Signed   By: Judie PetitM.  Shick M.D.   On: 11/28/2017 09:20        Scheduled Meds: . chlorhexidine  15 mL Mouth Rinse BID  . [START ON 12/01/2017] enoxaparin (LOVENOX) injection  30 mg Subcutaneous Q24H  . ferrous sulfate  325 mg Oral Q breakfast  . fluvoxaMINE  150 mg Per Tube QHS  . free water  300 mL Per Tube Q6H  . magnesium hydroxide  30 mL Per Tube Daily  . magnesium oxide  400 mg Per Tube BID  . mouth rinse  15 mL Mouth Rinse q12n4p  . megestrol  400 mg Per Tube Daily  . mirtazapine  45 mg Per Tube QHS  . nystatin  5 mL Oral QID  . OLANZapine zydis  15 mg Sublingual QHS  . OLANZapine zydis  2.5 mg Oral Daily  . phosphorus  500 mg Oral TID WC  . polyethylene glycol  17 g Per Tube BID  . sennosides  5 mL Per Tube QHS  . simvastatin  10 mg Per Tube q1800  . sodium chloride flush  3 mL Intravenous Q12H  . thiamine  100 mg Oral Daily  . vitamin C  250 mg Per Tube BID   Continuous Infusions: .  ceFAZolin (ANCEF) IV    . feeding supplement (OSMOLITE 1.2 CAL) 1,000 mL (11/25/17 1646)     LOS: 54 days     Jacquelin Hawkingalph Marializ Ferrebee, MD [facility] 11/28/2017, 11:16 AM  If 7PM-7AM, please contact night-coverage [email or web address]

## 2017-11-28 NOTE — Procedures (Signed)
Dysphagia  S/p 20 FR FLUORO GTUBE  NO comp Stable EBL min Full use tomorrow Report in pacs

## 2017-11-28 NOTE — Progress Notes (Signed)
OT Cancellation Note  Patient Details Name: Coolidge BreezeDana Mcatee MRN: [id number] DOB: 03-Nov-1950   Cancelled Treatment:    Reason Eval/Treat Not Completed: Other (comment). Spoke with RN and NT, pt still rather sedated from earlier procedure. Will re-attempt tx session at later date.   Ignacia Palmaathy Kahleel Fadeley, OTR/L Acute Rehab Services Pager [phone] Office [phone]    Evette GeorgesLeonard, Danniella Robben Eva   11/28/2017, 10:55 AM

## 2017-11-28 NOTE — Clinical Social Work Note (Signed)
IVC paperwork updated today, faxed to Magistrate's office and CSW received the Findings and Custody Order Involuntary Commitment forms. Paperwork placed in patient's chart. [facility] and CSW continues to check in with the intake staff person to confirm she remains on their waiting list.  Genelle BalVanessa Marianna Cid, MSW, LCSW Licensed Clinical Social Worker Clinical Social Work Department [facility] [phone]

## 2017-11-29 LAB — GLUCOSE, CAPILLARY
GLUCOSE-CAPILLARY: 151 mg/dL — AB (ref 70–99)
Glucose-Capillary: 80 mg/dL (ref 70–99)
Glucose-Capillary: 91 mg/dL (ref 70–99)

## 2017-11-29 MED ORDER — OSMOLITE 1.5 CAL PO LIQD
237.0000 mL | Freq: Every day | ORAL | Status: DC
  Administered 2017-11-29 – 2017-12-11 (×56): 237 mL
  Filled 2017-11-29 (×41): qty 237
  Filled 2017-11-29: qty 1000
  Filled 2017-11-29 (×10): qty 237
  Filled 2017-11-29: qty 1000
  Filled 2017-11-29 (×14): qty 237
  Filled 2017-11-29: qty 1000
  Filled 2017-11-29: qty 237

## 2017-11-29 NOTE — Progress Notes (Signed)
Referring Physician(s): Dr Tish Frederickson Nettey  Supervising Physician: Irish LackYamagata, Glenn  [facility]  Chief Complaint:  Dysphagia Protein calorie malnutrition  Subjective:  Percutaneous gastric tube placed 11/18 in IR Intact NT  Allergies: Codeine; Sulfonamide derivatives; and Tetanus toxoid  Medications: Prior to Admission medications   Medication Sig Start Date End Date Taking? Authorizing Provider  fluvoxaMINE (LUVOX) 50 MG tablet Take 3 tablets (150 mg total) by mouth at bedtime. For mood control 09/08/17  Yes Rhetta MuraSamtani, Jai-Gurmukh, MD  Lactobacillus Rhamnosus, GG, (CULTURELLE) CAPS Take 1 capsule by mouth daily.   Yes [provider]  mirtazapine (REMERON SOL-TAB) 45 MG disintegrating tablet Take 45 mg by mouth at bedtime.   Yes [provider]  OLANZapine zydis (ZYPREXA) 10 MG disintegrating tablet Take 10 mg by mouth at bedtime.   Yes [provider]  simvastatin (ZOCOR) 40 MG tablet Take 40 mg by mouth daily.  09/15/17  Yes [provider]  tetrahydrozoline-zinc (VISINE-AC) 0.05-0.25 % ophthalmic solution Place 2 drops into both eyes 3 (three) times daily as needed (for dryness).   Yes [provider]  venlafaxine XR (EFFEXOR-XR) 37.5 MG 24 hr capsule Take 75 mg by mouth daily with breakfast.    Yes [provider]  hydrocortisone cream 1 % Apply to affected area 2 times daily Patient taking differently: Apply 1 application topically 2 (two) times daily as needed (for anal itching).  04/18/17   Palumbo, April, MD  LORazepam (ATIVAN) 1 MG tablet Take 1 tablet (1 mg total) by mouth 2 (two) times daily. Patient not taking: Reported on 10/05/2017 09/08/17   Rhetta MuraSamtani, Jai-Gurmukh, MD  megestrol (MEGACE) 40 MG tablet Take 1 tablet (40 mg total) by mouth daily. Patient not taking: Reported on 10/05/2017 06/11/17   Money, Gerlene Burdockravis B, FNP  polyethylene glycol [facility](MIRALAX) packet Take 17 g by mouth 2 (two) times daily. Patient not  taking: Reported on 10/05/2017 04/18/17   Nicanor AlconPalumbo, April, MD     Vital Signs: BP 122/63 (BP Location: Left Arm)   Pulse 99   Temp 98.4 F (36.9 C) (Oral)   Resp 20   Ht 5\' 2"  (1.575 m)   Wt 104 lb 8 oz (47.4 kg)   SpO2 99%   BMI 19.11 kg/m   Physical Exam  Abdominal: Soft. Bowel sounds are normal. There is no tenderness.  Skin: Skin is warm and dry.  Site is clean Some dried blood at site No active bleeding No hematoma NT G tube intact  Vitals reviewed.   Imaging: Ct Abdomen Wo Contrast  Result Date: 11/25/2017 CLINICAL DATA:  Evaluate anatomy for potential percutaneous gastrostomy tube placement. EXAM: CT ABDOMEN WITHOUT CONTRAST TECHNIQUE: Multidetector CT imaging of the abdomen was performed following the standard protocol without IV contrast. COMPARISON:  Acute abdominal radiographic series - 04/18/2017 FINDINGS: Lack of intravenous contrast limits the ability to evaluate solid abdominal organs. The examination is further degraded secondary to patient respiratory artifact. Lower chest: Limited visualization of the lower thorax demonstrates minimal subsegmental atelectasis within the imaged left lower lobe. No discrete focal airspace opacities. No pleural effusion. Normal heart size.  No pericardial effusion. Hepatobiliary: Normal hepatic contour. Normal noncontrast appearance of the gallbladder given degree of distention. No ascites. Pancreas: Normal noncontrast appearance of the pancreas Spleen: Normal noncontrast appearance the spleen Adrenals/Urinary Tract: Normal noncontrast appearance of the bilateral kidneys. No renal stones. No urine obstruction or perinephric stranding. Normal noncontrast appearance the adrenal glands. The urinary bladder was not  imaged. Stomach/Bowel: The anterior wall the stomach is well apposed against the ventral wall of the abdomen though note is made of hypertrophy of the lateral segment left lobe of the liver. Enteric tube is coiled with gastric lumen.  No evidence of enteric obstruction. No pneumoperitoneum, pneumatosis or portal venous gas. Vascular/Lymphatic: Atherosclerotic plaque within the abdominal aorta. No definitive bulky retroperitoneal or mesenteric adenopathy on this noncontrast examination. Other: Mild diffuse body wall anasarca. Musculoskeletal: Severe DDD of L4-L5 with disc space height loss, endplate irregularity and small posteriorly directed disc osteophyte complex at this location. IMPRESSION: 1. Gastric anatomy amenable to percutaneous gastrostomy tube placement as indicated though note, there is hypertrophy of the left lobe of the liver and sonographic evaluation to demarcate the liver edge could be performed prior to attempted percutaneous gastrostomy tube placement as deemed appropriate by the performing interventional radiologist. 2.  Aortic Atherosclerosis (ICD10-I70.0). Electronically Signed   By: Simonne Come M.D.   On: 11/25/2017 17:01   Dg Abd 1 View  Result Date: 11/28/2017 CLINICAL DATA:  Evaluate anatomy prior to potential percutaneous gastrostomy tube placement. EXAM: ABDOMEN - 1 VIEW COMPARISON:  CT abdomen pelvis-11/25/2017 FINDINGS: Enteric contrast is seen within the distal small bowel and throughout the colon. There is no significant residual enteric contrast seen within the gastric lumen. No evidence of enteric obstruction. No supine evidence of pneumoperitoneum. No pneumatosis or portal venous gas. Atherosclerotic plaque within the abdominal aorta. No definitive abnormal intra-abdominal calcifications given overlying enteric barium. Weighted tip enteric tube overlies expected location of the gastric fundus. Limited visualization of the lower thorax demonstrates left basilar/retrocardiac heterogeneous opacities, likely atelectasis. No acute osseus abnormalities. IMPRESSION: Enteric contrast seen throughout the transverse colon. No evidence of enteric obstruction Electronically Signed   By: Simonne Come M.D.   On:  11/28/2017 08:03   Ir Gastrostomy Tube Mod Sed  Result Date: 11/28/2017 INDICATION: Dysphagia, malnutrition EXAM: FLUOROSCOPIC 20 FRENCH PULL-THROUGH GASTROSTOMY Date:  11/28/2017 11/28/2017 9:16 am Radiologist:  Judie Petit. Ruel Favors, MD Guidance:  Fluoroscopic MEDICATIONS: 1 g Ancef; Antibiotics were administered within 1 hour of the procedure. Glucagon 0.5 mg IV ANESTHESIA/SEDATION: Versed 1.5 mg IV; Fentanyl 75 mcg IV Moderate Sedation Time:  16 minutes The patient was continuously monitored during the procedure by the interventional radiology nurse under my direct supervision. CONTRAST:  10 cc-administered into the gastric lumen. FLUOROSCOPY TIME:  Fluoroscopy Time: 4 minutes 30 seconds (8 mGy). COMPLICATIONS: None immediate. PROCEDURE: Informed consent was obtained from the patient following explanation of the procedure, risks, benefits and alternatives. The patient understands, agrees and consents for the procedure. All questions were addressed. A time out was performed. Maximal barrier sterile technique utilized including caps, mask, sterile gowns, sterile gloves, large sterile drape, hand hygiene, and betadine prep. The left upper quadrant was sterilely prepped and draped. An oral gastric catheter was inserted into the stomach under fluoroscopy. The existing nasogastric feeding tube was removed. Air was injected into the stomach for insufflation and visualization under fluoroscopy. The air distended stomach was confirmed beneath the anterior abdominal wall in the frontal and lateral projections. Under sterile conditions and local anesthesia, a 17 gauge trocar needle was utilized to access the stomach percutaneously beneath the left subcostal margin. Needle position was confirmed within the stomach under biplane fluoroscopy. Contrast injection confirmed position also. A single T tack was deployed for gastropexy. Over an Amplatz guide wire, a 9-French sheath was inserted into the stomach. A snare device was  utilized to capture the oral gastric catheter.  The snare device was pulled retrograde from the stomach up the esophagus and out the oropharynx. The 20-French pull-through gastrostomy was connected to the snare device and pulled antegrade through the oropharynx down the esophagus into the stomach and then through the percutaneous tract external to the patient. The gastrostomy was assembled externally. Contrast injection confirms position in the stomach. Images were obtained for documentation. The patient tolerated procedure well. No immediate complication. IMPRESSION: Fluoroscopic insertion of a 20-French "pull-through" gastrostomy. Electronically Signed   By: Judie Petit.  Shick M.D.   On: 11/28/2017 09:20    Labs:  CBC: Recent Labs    10/22/17 0629 10/23/17 0648 10/24/17 0834 11/12/17 0712  WBC 6.2 5.8 5.6 7.3  HGB 12.4 11.2* 11.9* 12.0  HCT 37.8 36.6 37.5 38.6  PLT 304 268 286 389    COAGS: Recent Labs    11/28/17 0514  INR 1.05    BMP: Recent Labs    11/08/17 0638 11/12/17 0712 11/20/17 0554 11/25/17 1241  NA 143 140 140 139  K 3.8 3.9 3.5 4.1  CL 109 107 101 101  CO2 29 29 34* 30  GLUCOSE 136* 147* 124* 135*  BUN 13 20 36* 32*  CALCIUM 9.1 9.7 10.2 9.5  CREATININE 0.49 0.58 0.83 0.73  GFRNONAA >60 >60 >60 >60  GFRAA >60 >60 >60 >60    LIVER FUNCTION TESTS: Recent Labs    09/07/17 0932 10/05/17 1813 10/12/17 0401 11/12/17 0712  BILITOT 0.2* 1.1 0.5 0.5  AST 25 41 33 19  ALT 17 26 22 21   ALKPHOS 60 85 61 74  PROT 4.8* 6.7 5.4* 6.0*  ALBUMIN 2.8* 3.7 2.9* 2.7*    Assessment and Plan:  G tube placed in IR 11/18 May use now  Electronically Signed: Shakeisha Horine A, PA-C 11/29/2017, 7:22 AM   I spent a total of 15 Minutes at the the patient's bedside AND on the patient's hospital floor or unit, greater than 50% of which was counseling/coordinating care for Perc g tube placement

## 2017-11-29 NOTE — Progress Notes (Signed)
Nutrition Follow-up  DOCUMENTATION CODES:   Severe malnutrition in context of social or environmental circumstances, Underweight  INTERVENTION:   Tube Feeding:  Transition to Bolus Feeding Osmolite 1.5:  237 mL (1 can) 5 times daily Provides 1775 kcals, 75 g of protein and 905 mL of free water   NUTRITION DIAGNOSIS:   Severe Malnutrition related to social / environmental circumstances(severe major depressive disorder, refractory depression, anxiety) as evidenced by severe fat depletion, severe muscle depletion.  Being addressed via G-tube placement, TF   GOAL:   Patient will meet greater than or equal to 90% of their needs  Met  MONITOR:   PO intake, Supplement acceptance, Labs, Weight trends  REASON FOR ASSESSMENT:   Malnutrition Screening Tool    ASSESSMENT:   67 yo female admitted with AKI, dehydration, anorexia with acute metabolic encephalopathy, FTT. Pt has been refusing to eat, drink or take medications. Pt has severe protein calorie malnutrition. Pt with recent inpatient psych hospitalization 1 month ago. Pt reports difficulty swallowing although pt's sister believes this to be subjective. Pt with MDD, severe, with psych consult. PMH includes anxiety, depression, malnutrition  11/18 IR placed G-tube  Pt remains IVC, 1:1 sitter, awaiting bed at [facility]  Pt has been tolerating Osmolite 1.2 @ 60 ml/hr via Cortrak tube Pt now s/p G-tube placement, Cortrak has been removed Plan to transition to bolus feedings today  PO intake remains minimal  No new weight since 11/09 and increase in TF rate  Labs: CBGs wdl Meds: ferrous sulfate, mag ox, megace, thiamine, remeron, Vitamin C, phosphorus  Diet Order:   Diet Order            DIET - DYS 1 Room service appropriate? Yes; Fluid consistency: Thin  Diet effective now              EDUCATION NEEDS:   Not appropriate for education at this time  Skin:  Skin Assessment: Skin Integrity Issues: Skin  Integrity Issues:: Stage II Stage II: buttock  Last BM:  11/15  Height:   Ht Readings from Last 1 Encounters:  10/05/17 5' 2"  (1.575 m)    Weight:   Wt Readings from Last 1 Encounters:  11/19/17 47.4 kg    Ideal Body Weight:  50 kg  BMI:  Body mass index is 19.11 kg/m.  Estimated Nutritional Needs:   Kcal:  1550-1750 kcals   Protein:  72-82  Fluid:  >/= 1.7 L   Kerman Passey MS, RD, LDN, CNSC [phone] Pager  [phone] Weekend/On-Call Pager

## 2017-11-29 NOTE — Progress Notes (Signed)
PROGRESS NOTE    Amanda Hall  WUJ:[id number] DOB: [birth year] DOA: 10/05/2017 PCP: Gordy SaversKwiatkowski, Peter F, MD   Brief Narrative: Amanda BreezeDana Saephanh is a 67 y.o. female with a history of depression, anxiety.  Patient presents secondary to confusion and agitation.  She is currently awaiting inpatient psychiatry placement for her severe depression and anxiety. G tube placed 11/18.   Assessment & Plan:   Principal Problem:   MDD (major depressive disorder), recurrent severe, without psychosis (HCC) Active Problems:   MDD (major depressive disorder)   Anorexia   Dysphagia   Severe malnutrition (HCC)   Anxiety   Pressure injury of skin  Severe depression/anxiety Still requiring inpatient psych placement per psychiatry recommendations -Continue Klonopin 0.5 mg TID prn, Luvox 150 mg qhs, Remeron 45 mg qhs, Zyprexa 2.5 mg daily and 15 mg qhs, thiamine, continue sitter  Cardiac amyloidosis Suspected on Transthoracic Echocardiogram. -Outpatient follow-up  Sinus tachycardia Resolved.  Severe protein calorie malnutrition Patient is s/p cortrak which was placed 30+ days ago. Discussed with patient, although she does not have capacity, and she is agreeable to placement. Discussed with next of kin, patient's sister, and she is in agreement. S/p G-tube placement on 11/18. -Tube feeds today; dietitian re-consulted (plan for bolus feeds)  AKI Resolved  Iron deficiency anemia -Continue iron supplementation   Pressure Injury Documentation: Left buttocks, not present on arrival  DVT prophylaxis: Lovenox Code Status:   Code Status: Full Code Family Communication: None at bedside Disposition Plan: Inpatient psychiatry   Consultants:   Psychiatry  Cardiology  Palliative care  Interventional radiology  Procedures:   G-tube placement (11/18)  Antimicrobials:  None   Subjective: Confused about having her G tube placed. Says she doesn't remember talking about  it.  Objective: Vitals:   11/28/17 1647 11/28/17 2142 11/29/17 0603 11/29/17 0747  BP: 121/71 139/90 122/63 (!) 112/54  Pulse: 99 (!) 104 99 100  Resp: 16 20 20 20   Temp: 97.9 F (36.6 C) 98 F (36.7 C) 98.4 F (36.9 C) 97.6 F (36.4 C)  TempSrc: Oral Axillary Oral Oral  SpO2: 98% 99% 99% 100%  Weight:      Height:        Intake/Output Summary (Last 24 hours) at 11/29/2017 1214 Last data filed at 11/28/2017 2137 Gross per 24 hour  Intake 3 ml  Output -  Net 3 ml   Filed Weights   11/07/17 0500 11/17/17 1946 11/19/17 2155  Weight: 46.6 kg 47.3 kg 47.4 kg    Examination:  General: No distress, cachectic Psych: Anxious  Data Reviewed: I have personally reviewed following labs and imaging studies  CBC: No results for input(s): WBC, NEUTROABS, HGB, HCT, MCV, PLT in the last 168 hours. Basic Metabolic Panel: Recent Labs  Lab 11/25/17 1241  NA 139  K 4.1  CL 101  CO2 30  GLUCOSE 135*  BUN 32*  CREATININE 0.73  CALCIUM 9.5  MG 2.1  PHOS 3.2   GFR: Estimated Creatinine Clearance: 51.8 mL/min (by C-G formula based on SCr of 0.73 mg/dL). Liver Function Tests: No results for input(s): AST, ALT, ALKPHOS, BILITOT, PROT, ALBUMIN in the last 168 hours. No results for input(s): LIPASE, AMYLASE in the last 168 hours. No results for input(s): AMMONIA in the last 168 hours. Coagulation Profile: Recent Labs  Lab 11/28/17 0514  INR 1.05   Cardiac Enzymes: No results for input(s): CKTOTAL, CKMB, CKMBINDEX, TROPONINI in the last 168 hours. BNP (last 3 results) No results for input(s): PROBNP in the  last 8760 hours. HbA1C: No results for input(s): HGBA1C in the last 72 hours. CBG: Recent Labs  Lab 11/28/17 1213 11/28/17 1649 11/28/17 2144 11/29/17 0744 11/29/17 1153  GLUCAP 82 70 83 80 91   Lipid Profile: No results for input(s): CHOL, HDL, LDLCALC, TRIG, CHOLHDL, LDLDIRECT in the last 72 hours. Thyroid Function Tests: No results for input(s): TSH,  T4TOTAL, FREET4, T3FREE, THYROIDAB in the last 72 hours. Anemia Panel: No results for input(s): VITAMINB12, FOLATE, FERRITIN, TIBC, IRON, RETICCTPCT in the last 72 hours. Sepsis Labs: No results for input(s): PROCALCITON, LATICACIDVEN in the last 168 hours.  No results found for this or any previous visit (from the past 240 hour(s)).       Radiology Studies: Dg Abd 1 View  Result Date: 11/28/2017 CLINICAL DATA:  Evaluate anatomy prior to potential percutaneous gastrostomy tube placement. EXAM: ABDOMEN - 1 VIEW COMPARISON:  CT abdomen pelvis-11/25/2017 FINDINGS: Enteric contrast is seen within the distal small bowel and throughout the colon. There is no significant residual enteric contrast seen within the gastric lumen. No evidence of enteric obstruction. No supine evidence of pneumoperitoneum. No pneumatosis or portal venous gas. Atherosclerotic plaque within the abdominal aorta. No definitive abnormal intra-abdominal calcifications given overlying enteric barium. Weighted tip enteric tube overlies expected location of the gastric fundus. Limited visualization of the lower thorax demonstrates left basilar/retrocardiac heterogeneous opacities, likely atelectasis. No acute osseus abnormalities. IMPRESSION: Enteric contrast seen throughout the transverse colon. No evidence of enteric obstruction Electronically Signed   By: Simonne Come M.D.   On: 11/28/2017 08:03   Ir Gastrostomy Tube Mod Sed  Result Date: 11/28/2017 INDICATION: Dysphagia, malnutrition EXAM: FLUOROSCOPIC 20 FRENCH PULL-THROUGH GASTROSTOMY Date:  11/28/2017 11/28/2017 9:16 am Radiologist:  Judie Petit. Ruel Favors, MD Guidance:  Fluoroscopic MEDICATIONS: 1 g Ancef; Antibiotics were administered within 1 hour of the procedure. Glucagon 0.5 mg IV ANESTHESIA/SEDATION: Versed 1.5 mg IV; Fentanyl 75 mcg IV Moderate Sedation Time:  16 minutes The patient was continuously monitored during the procedure by the interventional radiology nurse under my  direct supervision. CONTRAST:  10 cc-administered into the gastric lumen. FLUOROSCOPY TIME:  Fluoroscopy Time: 4 minutes 30 seconds (8 mGy). COMPLICATIONS: None immediate. PROCEDURE: Informed consent was obtained from the patient following explanation of the procedure, risks, benefits and alternatives. The patient understands, agrees and consents for the procedure. All questions were addressed. A time out was performed. Maximal barrier sterile technique utilized including caps, mask, sterile gowns, sterile gloves, large sterile drape, hand hygiene, and betadine prep. The left upper quadrant was sterilely prepped and draped. An oral gastric catheter was inserted into the stomach under fluoroscopy. The existing nasogastric feeding tube was removed. Air was injected into the stomach for insufflation and visualization under fluoroscopy. The air distended stomach was confirmed beneath the anterior abdominal wall in the frontal and lateral projections. Under sterile conditions and local anesthesia, a 17 gauge trocar needle was utilized to access the stomach percutaneously beneath the left subcostal margin. Needle position was confirmed within the stomach under biplane fluoroscopy. Contrast injection confirmed position also. A single T tack was deployed for gastropexy. Over an Amplatz guide wire, a 9-French sheath was inserted into the stomach. A snare device was utilized to capture the oral gastric catheter. The snare device was pulled retrograde from the stomach up the esophagus and out the oropharynx. The 20-French pull-through gastrostomy was connected to the snare device and pulled antegrade through the oropharynx down the esophagus into the stomach and then through the percutaneous tract  external to the patient. The gastrostomy was assembled externally. Contrast injection confirms position in the stomach. Images were obtained for documentation. The patient tolerated procedure well. No immediate complication.  IMPRESSION: Fluoroscopic insertion of a 20-French "pull-through" gastrostomy. Electronically Signed   By: Judie Petit.  Shick M.D.   On: 11/28/2017 09:20        Scheduled Meds: . chlorhexidine  15 mL Mouth Rinse BID  . [START ON 12/01/2017] enoxaparin (LOVENOX) injection  30 mg Subcutaneous Q24H  . ferrous sulfate  325 mg Oral Q breakfast  . fluvoxaMINE  150 mg Per Tube QHS  . free water  300 mL Per Tube Q6H  . magnesium hydroxide  30 mL Per Tube Daily  . magnesium oxide  400 mg Per Tube BID  . mouth rinse  15 mL Mouth Rinse q12n4p  . megestrol  400 mg Per Tube Daily  . mirtazapine  45 mg Per Tube QHS  . nystatin  5 mL Oral QID  . OLANZapine zydis  15 mg Sublingual QHS  . OLANZapine zydis  2.5 mg Oral Daily  . phosphorus  500 mg Oral TID WC  . polyethylene glycol  17 g Per Tube BID  . sennosides  5 mL Per Tube QHS  . simvastatin  10 mg Per Tube q1800  . sodium chloride flush  3 mL Intravenous Q12H  . thiamine  100 mg Oral Daily  . vitamin C  250 mg Per Tube BID   Continuous Infusions: . feeding supplement (OSMOLITE 1.2 CAL) 1,000 mL (11/25/17 1646)     LOS: 55 days     Jacquelin Hawking, MD [facility] 11/29/2017, 12:14 PM  If 7PM-7AM, please contact night-coverage [email or web address]

## 2017-11-30 LAB — RENAL FUNCTION PANEL
Albumin: 2.6 g/dL — ABNORMAL LOW (ref 3.5–5.0)
Anion gap: 7 (ref 5–15)
BUN: 38 mg/dL — AB (ref 8–23)
CALCIUM: 9.3 mg/dL (ref 8.9–10.3)
CHLORIDE: 102 mmol/L (ref 98–111)
CO2: 32 mmol/L (ref 22–32)
CREATININE: 0.79 mg/dL (ref 0.44–1.00)
GFR calc Af Amer: >60 mL/min (ref >60)
Glucose, Bld: 216 mg/dL — ABNORMAL HIGH (ref 70–99)
PHOSPHORUS: 2.5 mg/dL (ref 2.5–4.6)
Potassium: 3.2 mmol/L — ABNORMAL LOW (ref 3.5–5.1)
Sodium: 141 mmol/L (ref 135–145)

## 2017-11-30 LAB — CBC
HCT: 34.2 % — ABNORMAL LOW (ref 36.0–46.0)
Hemoglobin: 10.9 g/dL — ABNORMAL LOW (ref 12.0–15.0)
MCH: 29.8 pg (ref 26.0–34.0)
MCHC: 31.9 g/dL (ref 30.0–36.0)
MCV: 93.4 fL (ref 80.0–100.0)
NRBC: 0 % (ref 0.0–0.2)
PLATELETS: 271 10*3/uL (ref 150–400)
RBC: 3.66 MIL/uL — AB (ref 3.87–5.11)
RDW: 13.5 % (ref 11.5–15.5)
WBC: 8.7 10*3/uL (ref 4.0–10.5)

## 2017-11-30 LAB — GLUCOSE, CAPILLARY
GLUCOSE-CAPILLARY: 129 mg/dL — AB (ref 70–99)
GLUCOSE-CAPILLARY: 166 mg/dL — AB (ref 70–99)
Glucose-Capillary: 266 mg/dL — ABNORMAL HIGH (ref 70–99)
Glucose-Capillary: 146 mg/dL — ABNORMAL HIGH (ref 70–99)
Glucose-Capillary: 75 mg/dL (ref 70–99)

## 2017-11-30 NOTE — Progress Notes (Signed)
Occupational Therapy Treatment Patient Details Name: Amanda Hall MRN: [id number] DOB: February 25, 1950 Today's Date: 11/30/2017    History of present illness Pt is a 67 y/o female admitted secondary to major depressive disorder. Pt with increased confusion and agitation prior to admission. Found to have AKI and acute encephalopathy. CT negative for acute abnormality. PMH includes anxiety and depression.    OT comments  Pt with participation with OT this day           Precautions / Restrictions Precautions Precaution Comments: Safety sitter Restrictions Weight Bearing Restrictions: No              ADL either performed or assessed with clinical judgement   ADL Overall ADL's : Needs assistance/impaired Eating/Feeding: Moderate assistance;Bed level Eating/Feeding Details (indicate cue type and reason): Pt able to hold cup with R hand with OT to A with weight of the cup. Pt able to bring cup to mouth.  Encouraged sitter to A pt with this rather than doing it for her.                                     General ADL Comments: Pt did agree to BUE exercise.  Pt initially said I cant- but OT had pt think of the book 'the little engine that could'.  Before each shoulder raise pt said ' i think i can' and performed AROM BUE for 10 reps. Pt also performed elbow , wrist and hand ROm.  Pt did paricipate in approx 12 min of activity.  Upon pt stating she was OT OT told he she was proud of her.  Pt said ' I did it damnit'     Vision Baseline Vision/History: Wears glasses     Perception     Praxis      Cognition Arousal/Alertness: Awake/alert Behavior During Therapy: Flat affect Overall Cognitive Status: No family/caregiver present to determine baseline cognitive functioning                                                     Pertinent Vitals/ Pain       Pain Score: 2  Pain Location: L shoulder with AROM Pain Descriptors / Indicators: Grimacing Pain  Intervention(s): Repositioned     Prior Functioning/Environment              Frequency  Min 2X/week        Progress Toward Goals  OT Goals(current goals can now be found in the care plan section)  Progress towards OT goals: Progressing toward goals(progressing toward goal of activity for 15 min)     Plan Discharge plan remains appropriate       AM-PAC PT "6 Clicks" Daily Activity     Outcome Measure   Help from another person eating meals?: A Lot Help from another person taking care of personal grooming?: A Lot Help from another person toileting, which includes using toliet, bedpan, or urinal?: Total   Help from another person to put on and taking off regular upper body clothing?: Total Help from another person to put on and taking off regular lower body clothing?: Total 6 Click Score: 7       Activity Tolerance Patient tolerated treatment well   Patient Left in bed  Nurse Communication Other (comment)(sitter - encouraged her to A pt hold cup for drinking)        Time: 1610-9604 OT Time Calculation (min): 15 min  Charges: OT General Charges $OT Visit: 1 Visit OT Treatments $Therapeutic Activity: 8-22 mins  Lise Auer, OT Acute Rehabilitation Services [phone] Office- [phone]      Shyam Dawson, [name] 11/30/2017, 3:16 PM

## 2017-11-30 NOTE — Progress Notes (Signed)
[facility]                                                                              Patient Demographics  Amanda Hall, is a 67 y.o. female, DOB - 02/16/1950, ZOX:[id number]  Admit date - 10/05/2017   Admitting Physician Briscoe Deutscher, MD  Outpatient Primary MD for the patient is [name], MD  Outpatient specialists:   LOS - 56  days   Medical records reviewed and are as summarized below:    Chief Complaint  Patient presents with  . Failure To Thrive  . IVC       Brief summary   Amanda Hall is a 67 y.o. female with a history of depression, anxiety.  Patient presents secondary to confusion and agitation.  She is currently awaiting inpatient psychiatry placement for her severe depression and anxiety. G tube placed 11/18.   Assessment & Plan    Principal Problem:   MDD (major depressive disorder), recurrent severe -Patient has been followed by psychiatry while inpatient, still requiring inpatient psych placement per recommendations -Continue Klonopin, Luvox, Remeron, Zyprexa, thiamine -Continue safety sitter  Active Problems:    Anorexia with severe protein calorie malnutrition -Cortrack was placed during this admission, does not have capacity, per patient's sister next of kin, G-tube was placed 11/8, starting tube feeds today -Still on dysphagia 1 diet however not eating much  Sinus tachycardia Resolved  Acute kidney injury -Likely due to dehydration, anorexia, severe protein calorie malnutrition, resolved  Iron deficiency anemia Continue iron supplementation  Pressure injury of skin -Stage II, partial-thickness, left buttocks, not present on admission -Wound care per nursing  Code Status: Full code DVT Prophylaxis: Lovenox Family Communication: No family member at bedside   Disposition Plan: Still awaiting inpatient psych  Time Spent in minutes   25 minutes  Procedures:  G-tube placement on 11/19  Consultants:     Interventional radiology Psychiatry Cardiology Palliative care  Antimicrobials:      Medications  Scheduled Meds: . chlorhexidine  15 mL Mouth Rinse BID  . [START ON 12/01/2017] enoxaparin (LOVENOX) injection  30 mg Subcutaneous Q24H  . feeding supplement (OSMOLITE 1.5 CAL)  237 mL Per Tube 5 X Daily  . ferrous sulfate  325 mg Oral Q breakfast  . fluvoxaMINE  150 mg Per Tube QHS  . free water  300 mL Per Tube Q6H  . magnesium hydroxide  30 mL Per Tube Daily  . magnesium oxide  400 mg Per Tube BID  . mouth rinse  15 mL Mouth Rinse q12n4p  . megestrol  400 mg Per Tube Daily  . mirtazapine  45 mg Per Tube QHS  . nystatin  5 mL Oral QID  . OLANZapine zydis  15 mg Sublingual QHS  . OLANZapine zydis  2.5 mg Oral Daily  . phosphorus  500 mg Oral TID WC  . polyethylene glycol  17 g Per Tube BID  . sennosides  5 mL Per Tube QHS  . simvastatin  10 mg Per Tube q1800  . sodium chloride flush  3 mL Intravenous Q12H  . thiamine  100 mg Oral Daily  .  vitamin C  250 mg Per Tube BID   Continuous Infusions: PRN Meds:.acetaminophen **OR** acetaminophen, clonazePAM, HYDROcodone-acetaminophen, labetalol, loperamide, milk and molasses, naphazoline-glycerin, ondansetron **OR** ondansetron (ZOFRAN) IV   Antibiotics   Anti-infectives (From admission, onward)   Start     Dose/Rate Route Frequency Ordered Stop   11/28/17 0915  ceFAZolin (ANCEF) IVPB 1 g/50 mL premix     1 g 100 mL/hr over 30 Minutes Intravenous To Radiology 11/28/17 0907 11/29/17 0915   11/28/17 0847  ceFAZolin (ANCEF) 2-4 GM/100ML-% IVPB    Note to Pharmacy:  Domenica Fail   : cabinet override      11/28/17 0847 11/28/17 1046   11/28/17 0845  ceFAZolin (ANCEF) powder 1 g  Status:  Discontinued     1 g Other To Surgery 11/28/17 0830 11/28/17 0907   10/23/17 0830  cefTRIAXone (ROCEPHIN) 1 g in sodium chloride 0.9 % 100 mL IVPB  Status:  Discontinued     1 g 200 mL/hr over 30 Minutes Intravenous Daily 10/23/17 0721  10/26/17 1400        Subjective:   Amanda Hall was seen and examined today.,  Denies any specific complaints, sitter at the bedside  Objective:   Vitals:   11/29/17 1700 11/29/17 2053 11/30/17 0511 11/30/17 0849  BP: 105/64 102/69 130/77 114/86  Pulse: 97 85 100 98  Resp: 18 18 16 18   Temp: 97.7 F (36.5 C) 97.8 F (36.6 C) 98.6 F (37 C) 98.2 F (36.8 C)  TempSrc: Axillary   Oral  SpO2: 100% 99% 99% 100%  Weight:      Height:        Intake/Output Summary (Last 24 hours) at 11/30/2017 1318 Last data filed at 11/30/2017 0500 Gross per 24 hour  Intake 103 ml  Output 4 ml  Net 99 ml     Wt Readings from Last 3 Encounters:  11/19/17 47.4 kg  08/28/17 49 kg  07/13/17 54.4 kg     Exam  General: Alert and awake, anxious  Eyes:   HEENT:   Cardiovascular: S1 S2 clear, RRR  Respiratory: Clear to auscultation bilaterally,  Gastrointestinal: Soft, nontender, nondistended, + bowel sounds  Ext: no pedal edema bilaterally  Neuro: moving all 4 extremities spontaneously  Musculoskeletal: No digital cyanosis, clubbing  Skin: No rashes  Psych: anxious   Data Reviewed:  I have personally reviewed following labs and imaging studies  Micro Results No results found for this or any previous visit (from the past 240 hour(s)).  Radiology Reports Ct Abdomen Wo Contrast  Result Date: 11/25/2017 CLINICAL DATA:  Evaluate anatomy for potential percutaneous gastrostomy tube placement. EXAM: CT ABDOMEN WITHOUT CONTRAST TECHNIQUE: Multidetector CT imaging of the abdomen was performed following the standard protocol without IV contrast. COMPARISON:  Acute abdominal radiographic series - 04/18/2017 FINDINGS: Lack of intravenous contrast limits the ability to evaluate solid abdominal organs. The examination is further degraded secondary to patient respiratory artifact. Lower chest: Limited visualization of the lower thorax demonstrates minimal subsegmental atelectasis within the  imaged left lower lobe. No discrete focal airspace opacities. No pleural effusion. Normal heart size.  No pericardial effusion. Hepatobiliary: Normal hepatic contour. Normal noncontrast appearance of the gallbladder given degree of distention. No ascites. Pancreas: Normal noncontrast appearance of the pancreas Spleen: Normal noncontrast appearance the spleen Adrenals/Urinary Tract: Normal noncontrast appearance of the bilateral kidneys. No renal stones. No urine obstruction or perinephric stranding. Normal noncontrast appearance the adrenal glands. The urinary bladder was not imaged. Stomach/Bowel: The anterior wall  the stomach is well apposed against the ventral wall of the abdomen though note is made of hypertrophy of the lateral segment left lobe of the liver. Enteric tube is coiled with gastric lumen. No evidence of enteric obstruction. No pneumoperitoneum, pneumatosis or portal venous gas. Vascular/Lymphatic: Atherosclerotic plaque within the abdominal aorta. No definitive bulky retroperitoneal or mesenteric adenopathy on this noncontrast examination. Other: Mild diffuse body wall anasarca. Musculoskeletal: Severe DDD of L4-L5 with disc space height loss, endplate irregularity and small posteriorly directed disc osteophyte complex at this location. IMPRESSION: 1. Gastric anatomy amenable to percutaneous gastrostomy tube placement as indicated though note, there is hypertrophy of the left lobe of the liver and sonographic evaluation to demarcate the liver edge could be performed prior to attempted percutaneous gastrostomy tube placement as deemed appropriate by the performing interventional radiologist. 2.  Aortic Atherosclerosis (ICD10-I70.0). Electronically Signed   By: Simonne ComeJohn  Watts M.D.   On: 11/25/2017 17:01   Dg Abd 1 View  Result Date: 11/28/2017 CLINICAL DATA:  Evaluate anatomy prior to potential percutaneous gastrostomy tube placement. EXAM: ABDOMEN - 1 VIEW COMPARISON:  CT abdomen pelvis-11/25/2017  FINDINGS: Enteric contrast is seen within the distal small bowel and throughout the colon. There is no significant residual enteric contrast seen within the gastric lumen. No evidence of enteric obstruction. No supine evidence of pneumoperitoneum. No pneumatosis or portal venous gas. Atherosclerotic plaque within the abdominal aorta. No definitive abnormal intra-abdominal calcifications given overlying enteric barium. Weighted tip enteric tube overlies expected location of the gastric fundus. Limited visualization of the lower thorax demonstrates left basilar/retrocardiac heterogeneous opacities, likely atelectasis. No acute osseus abnormalities. IMPRESSION: Enteric contrast seen throughout the transverse colon. No evidence of enteric obstruction Electronically Signed   By: Simonne ComeJohn  Watts M.D.   On: 11/28/2017 08:03   Ir Gastrostomy Tube Mod Sed  Result Date: 11/28/2017 INDICATION: Dysphagia, malnutrition EXAM: FLUOROSCOPIC 20 FRENCH PULL-THROUGH GASTROSTOMY Date:  11/28/2017 11/28/2017 9:16 am Radiologist:  Judie PetitM. Ruel Favorsrevor Shick, MD Guidance:  Fluoroscopic MEDICATIONS: 1 g Ancef; Antibiotics were administered within 1 hour of the procedure. Glucagon 0.5 mg IV ANESTHESIA/SEDATION: Versed 1.5 mg IV; Fentanyl 75 mcg IV Moderate Sedation Time:  16 minutes The patient was continuously monitored during the procedure by the interventional radiology nurse under my direct supervision. CONTRAST:  10 cc-administered into the gastric lumen. FLUOROSCOPY TIME:  Fluoroscopy Time: 4 minutes 30 seconds (8 mGy). COMPLICATIONS: None immediate. PROCEDURE: Informed consent was obtained from the patient following explanation of the procedure, risks, benefits and alternatives. The patient understands, agrees and consents for the procedure. All questions were addressed. A time out was performed. Maximal barrier sterile technique utilized including caps, mask, sterile gowns, sterile gloves, large sterile drape, hand hygiene, and betadine prep.  The left upper quadrant was sterilely prepped and draped. An oral gastric catheter was inserted into the stomach under fluoroscopy. The existing nasogastric feeding tube was removed. Air was injected into the stomach for insufflation and visualization under fluoroscopy. The air distended stomach was confirmed beneath the anterior abdominal wall in the frontal and lateral projections. Under sterile conditions and local anesthesia, a 17 gauge trocar needle was utilized to access the stomach percutaneously beneath the left subcostal margin. Needle position was confirmed within the stomach under biplane fluoroscopy. Contrast injection confirmed position also. A single T tack was deployed for gastropexy. Over an Amplatz guide wire, a 9-French sheath was inserted into the stomach. A snare device was utilized to capture the oral gastric catheter. The snare device was pulled retrograde  from the stomach up the esophagus and out the oropharynx. The 20-French pull-through gastrostomy was connected to the snare device and pulled antegrade through the oropharynx down the esophagus into the stomach and then through the percutaneous tract external to the patient. The gastrostomy was assembled externally. Contrast injection confirms position in the stomach. Images were obtained for documentation. The patient tolerated procedure well. No immediate complication. IMPRESSION: Fluoroscopic insertion of a 20-French "pull-through" gastrostomy. Electronically Signed   By: Judie Petit.  Shick M.D.   On: 11/28/2017 09:20    Lab Data:  CBC: Recent Labs  Lab 11/30/17 0651  WBC 8.7  HGB 10.9*  HCT 34.2*  MCV 93.4  PLT 271   Basic Metabolic Panel: Recent Labs  Lab 11/25/17 1241 11/30/17 0651  NA 139 141  K 4.1 3.2*  CL 101 102  CO2 30 32  GLUCOSE 135* 216*  BUN 32* 38*  CREATININE 0.73 0.79  CALCIUM 9.5 9.3  MG 2.1  --   PHOS 3.2 2.5   GFR: Estimated Creatinine Clearance: 51.8 mL/min (by C-G formula based on SCr of 0.79  mg/dL). Liver Function Tests: Recent Labs  Lab 11/30/17 0651  ALBUMIN 2.6*   No results for input(s): LIPASE, AMYLASE in the last 168 hours. No results for input(s): AMMONIA in the last 168 hours. Coagulation Profile: Recent Labs  Lab 11/28/17 0514  INR 1.05   Cardiac Enzymes: No results for input(s): CKTOTAL, CKMB, CKMBINDEX, TROPONINI in the last 168 hours. BNP (last 3 results) No results for input(s): PROBNP in the last 8760 hours. HbA1C: No results for input(s): HGBA1C in the last 72 hours. CBG: Recent Labs  Lab 11/29/17 0744 11/29/17 1153 11/29/17 1807 11/30/17 0051 11/30/17 0841  GLUCAP 80 91 151* 129* 266*   Lipid Profile: No results for input(s): CHOL, HDL, LDLCALC, TRIG, CHOLHDL, LDLDIRECT in the last 72 hours. Thyroid Function Tests: No results for input(s): TSH, T4TOTAL, FREET4, T3FREE, THYROIDAB in the last 72 hours. Anemia Panel: No results for input(s): VITAMINB12, FOLATE, FERRITIN, TIBC, IRON, RETICCTPCT in the last 72 hours. Urine analysis:    Component Value Date/Time   COLORURINE AMBER (A) 10/23/2017 0106   APPEARANCEUR CLOUDY (A) 10/23/2017 0106   LABSPEC 1.009 10/23/2017 0106   PHURINE 9.0 (H) 10/23/2017 0106   GLUCOSEU NEGATIVE 10/23/2017 0106   HGBUR MODERATE (A) 10/23/2017 0106   BILIRUBINUR NEGATIVE 10/23/2017 0106   KETONESUR NEGATIVE 10/23/2017 0106   PROTEINUR NEGATIVE 10/23/2017 0106   UROBILINOGEN 0.2 12/15/2006 1654   NITRITE NEGATIVE 10/23/2017 0106   LEUKOCYTESUR MODERATE (A) 10/23/2017 0106     Bubba Vanbenschoten M.D. [facility] 11/30/2017, 1:18 PM  Pager: [phone] Between 7am to 7pm - call Pager - [phone]  After 7pm go to [email or web address] - password [email or web address]  Call night coverage person covering after 7pm

## 2017-12-01 LAB — GLUCOSE, CAPILLARY
GLUCOSE-CAPILLARY: 148 mg/dL — AB (ref 70–99)
Glucose-Capillary: 164 mg/dL — ABNORMAL HIGH (ref 70–99)
Glucose-Capillary: 176 mg/dL — ABNORMAL HIGH (ref 70–99)
Glucose-Capillary: 76 mg/dL (ref 70–99)

## 2017-12-01 NOTE — Clinical Social Work Note (Signed)
[facility] CSW confirmed that patient still needs inpatient psychiatric treatment. CSW will continue to follow and maintain contact with [facility] regarding bed availability.  Genelle BalVanessa Arvid Marengo, MSW, LCSW Licensed Clinical Social Worker Clinical Social Work Department [facility] [phone]

## 2017-12-01 NOTE — Progress Notes (Signed)
[facility]                                                                              Patient Demographics  Amanda Hall, is a 67 y.o. female, DOB - 1950-07-25, ZOX:[id number]  Admit date - 10/05/2017   Admitting Physician Amanda Deutscher, MD  Outpatient Primary MD for the patient is Amanda Savers, MD  Outpatient specialists:   LOS - 57  days   Medical records reviewed and are as summarized below:    Chief Complaint  Patient presents with  . Failure To Thrive  . IVC       Brief summary   Amanda Hall is a 67 y.o. female with a history of depression, anxiety.  Patient presents secondary to confusion and agitation.  She is currently awaiting inpatient psychiatry placement for her severe depression and anxiety. G tube placed 11/18.   Assessment & Plan    Principal Problem:   MDD (major depressive disorder), recurrent severe -Patient has been followed by psychiatry while inpatient, still requiring inpatient psych placement per recommendations -Continue Klonopin, Luvox, Remeron, Zyprexa, thiamine -Continue safety sitter -No acute issues  Active Problems:    Anorexia with severe protein calorie malnutrition -Cortrack was placed during this admission, does not have capacity, per patient's sister next of kin, G-tube was placed 11/8 -Still on dysphagia 1 diet however not eating much -Tube feeds per dietitian  Sinus tachycardia Resolved  Acute kidney injury -Likely due to dehydration, anorexia, severe protein calorie malnutrition, resolved  Iron deficiency anemia Continue iron supplementation  Pressure injury of skin -Stage II, partial-thickness, left buttocks, not present on admission -Wound care per nursing  Code Status: Full code DVT Prophylaxis: Lovenox Family Communication: No family member at bedside   Disposition Plan: Still awaiting inpatient psych  Time Spent in minutes   25 minutes  Procedures:  G-tube placement on  11/19  Consultants:   Interventional radiology Psychiatry Cardiology Palliative care  Antimicrobials:      Medications  Scheduled Meds: . chlorhexidine  15 mL Mouth Rinse BID  . enoxaparin (LOVENOX) injection  30 mg Subcutaneous Q24H  . feeding supplement (OSMOLITE 1.5 CAL)  237 mL Per Tube 5 X Daily  . ferrous sulfate  325 mg Oral Q breakfast  . fluvoxaMINE  150 mg Per Tube QHS  . free water  300 mL Per Tube Q6H  . magnesium hydroxide  30 mL Per Tube Daily  . magnesium oxide  400 mg Per Tube BID  . mouth rinse  15 mL Mouth Rinse q12n4p  . megestrol  400 mg Per Tube Daily  . mirtazapine  45 mg Per Tube QHS  . nystatin  5 mL Oral QID  . OLANZapine zydis  15 mg Sublingual QHS  . OLANZapine zydis  2.5 mg Oral Daily  . phosphorus  500 mg Oral TID WC  . polyethylene glycol  17 g Per Tube BID  . sennosides  5 mL Per Tube QHS  . simvastatin  10 mg Per Tube q1800  . sodium chloride flush  3 mL Intravenous Q12H  . thiamine  100 mg Oral Daily  .  vitamin C  250 mg Per Tube BID   Continuous Infusions: PRN Meds:.acetaminophen **OR** acetaminophen, clonazePAM, HYDROcodone-acetaminophen, labetalol, loperamide, milk and molasses, naphazoline-glycerin, ondansetron **OR** ondansetron (ZOFRAN) IV   Antibiotics   Anti-infectives (From admission, onward)   Start     Dose/Rate Route Frequency Ordered Stop   11/28/17 0915  ceFAZolin (ANCEF) IVPB 1 g/50 mL premix     1 g 100 mL/hr over 30 Minutes Intravenous To Radiology 11/28/17 0907 11/29/17 0915   11/28/17 0847  ceFAZolin (ANCEF) 2-4 GM/100ML-% IVPB    Note to Pharmacy:  Amanda Hall, Amanda Hall   : cabinet override      11/28/17 0847 11/28/17 1046   11/28/17 0845  ceFAZolin (ANCEF) powder 1 g  Status:  Discontinued     1 g Other To Surgery 11/28/17 0830 11/28/17 0907   10/23/17 0830  cefTRIAXone (ROCEPHIN) 1 g in sodium chloride 0.9 % 100 mL IVPB  Status:  Discontinued     1 g 200 mL/hr over 30 Minutes Intravenous Daily 10/23/17 0721  10/26/17 1400        Subjective:   Amanda Hall was seen and examined today.,  Sleepy, denies any specific complaints, sitter at the bedside.    Objective:   Vitals:   11/30/17 2140 12/01/17 0157 12/01/17 0618 12/01/17 1003  BP: 136/84  122/66 127/78  Pulse: 98  89 96  Resp:   14 18  Temp: 98.3 F (36.8 C)  97.9 F (36.6 C) 98.7 F (37.1 C)  TempSrc: Oral  Oral Oral  SpO2: 100%  100% 100%  Weight:  42.3 kg    Height:        Intake/Output Summary (Last 24 hours) at 12/01/2017 1450 Last data filed at 12/01/2017 0200 Gross per 24 hour  Intake -  Output 125 ml  Net -125 ml     Wt Readings from Last 3 Encounters:  12/01/17 42.3 kg  08/28/17 49 kg  07/13/17 54.4 kg   Physical Exam  General: Sleepy but arousable, cachectic  Eyes:  HEENT:    Cardiovascular: S1 S2 clear, RRR No pedal edema b/l  Respiratory: CTA B anteriorly  Gastrointestinal: Soft, nontender, nondistended, + bowel sounds  Ext: no pedal edema bilaterally  Neuro:   Musculoskeletal: No digital cyanosis, clubbing  Skin: No rashes  Psych: Sleepy but arousable    Data Reviewed:  I have personally reviewed following labs and imaging studies  Micro Results No results found for this or any previous visit (from the past 240 hour(s)).  Radiology Reports Ct Abdomen Wo Contrast  Result Date: 11/25/2017 CLINICAL DATA:  Evaluate anatomy for potential percutaneous gastrostomy tube placement. EXAM: CT ABDOMEN WITHOUT CONTRAST TECHNIQUE: Multidetector CT imaging of the abdomen was performed following the standard protocol without IV contrast. COMPARISON:  Acute abdominal radiographic series - 04/18/2017 FINDINGS: Lack of intravenous contrast limits the ability to evaluate solid abdominal organs. The examination is further degraded secondary to patient respiratory artifact. Lower chest: Limited visualization of the lower thorax demonstrates minimal subsegmental atelectasis within the imaged left lower  lobe. No discrete focal airspace opacities. No pleural effusion. Normal heart size.  No pericardial effusion. Hepatobiliary: Normal hepatic contour. Normal noncontrast appearance of the gallbladder given degree of distention. No ascites. Pancreas: Normal noncontrast appearance of the pancreas Spleen: Normal noncontrast appearance the spleen Adrenals/Urinary Tract: Normal noncontrast appearance of the bilateral kidneys. No renal stones. No urine obstruction or perinephric stranding. Normal noncontrast appearance the adrenal glands. The urinary bladder was not imaged. Stomach/Bowel: The anterior  wall the stomach is well apposed against the ventral wall of the abdomen though note is made of hypertrophy of the lateral segment left lobe of the liver. Enteric tube is coiled with gastric lumen. No evidence of enteric obstruction. No pneumoperitoneum, pneumatosis or portal venous gas. Vascular/Lymphatic: Atherosclerotic plaque within the abdominal aorta. No definitive bulky retroperitoneal or mesenteric adenopathy on this noncontrast examination. Other: Mild diffuse body wall anasarca. Musculoskeletal: Severe DDD of L4-L5 with disc space height loss, endplate irregularity and small posteriorly directed disc osteophyte complex at this location. IMPRESSION: 1. Gastric anatomy amenable to percutaneous gastrostomy tube placement as indicated though note, there is hypertrophy of the left lobe of the liver and sonographic evaluation to demarcate the liver edge could be performed prior to attempted percutaneous gastrostomy tube placement as deemed appropriate by the performing interventional radiologist. 2.  Aortic Atherosclerosis (ICD10-I70.0). Electronically Signed   By: Simonne Come M.D.   On: 11/25/2017 17:01   Dg Abd 1 View  Result Date: 11/28/2017 CLINICAL DATA:  Evaluate anatomy prior to potential percutaneous gastrostomy tube placement. EXAM: ABDOMEN - 1 VIEW COMPARISON:  CT abdomen pelvis-11/25/2017 FINDINGS: Enteric  contrast is seen within the distal small bowel and throughout the colon. There is no significant residual enteric contrast seen within the gastric lumen. No evidence of enteric obstruction. No supine evidence of pneumoperitoneum. No pneumatosis or portal venous gas. Atherosclerotic plaque within the abdominal aorta. No definitive abnormal intra-abdominal calcifications given overlying enteric barium. Weighted tip enteric tube overlies expected location of the gastric fundus. Limited visualization of the lower thorax demonstrates left basilar/retrocardiac heterogeneous opacities, likely atelectasis. No acute osseus abnormalities. IMPRESSION: Enteric contrast seen throughout the transverse colon. No evidence of enteric obstruction Electronically Signed   By: Simonne Come M.D.   On: 11/28/2017 08:03   Ir Gastrostomy Tube Mod Sed  Result Date: 11/28/2017 INDICATION: Dysphagia, malnutrition EXAM: FLUOROSCOPIC 20 FRENCH PULL-THROUGH GASTROSTOMY Date:  11/28/2017 11/28/2017 9:16 am Radiologist:  Judie Petit. Ruel Favors, MD Guidance:  Fluoroscopic MEDICATIONS: 1 g Ancef; Antibiotics were administered within 1 hour of the procedure. Glucagon 0.5 mg IV ANESTHESIA/SEDATION: Versed 1.5 mg IV; Fentanyl 75 mcg IV Moderate Sedation Time:  16 minutes The patient was continuously monitored during the procedure by the interventional radiology nurse under my direct supervision. CONTRAST:  10 cc-administered into the gastric lumen. FLUOROSCOPY TIME:  Fluoroscopy Time: 4 minutes 30 seconds (8 mGy). COMPLICATIONS: None immediate. PROCEDURE: Informed consent was obtained from the patient following explanation of the procedure, risks, benefits and alternatives. The patient understands, agrees and consents for the procedure. All questions were addressed. A time out was performed. Maximal barrier sterile technique utilized including caps, mask, sterile gowns, sterile gloves, large sterile drape, hand hygiene, and betadine prep. The left upper  quadrant was sterilely prepped and draped. An oral gastric catheter was inserted into the stomach under fluoroscopy. The existing nasogastric feeding tube was removed. Air was injected into the stomach for insufflation and visualization under fluoroscopy. The air distended stomach was confirmed beneath the anterior abdominal wall in the frontal and lateral projections. Under sterile conditions and local anesthesia, a 17 gauge trocar needle was utilized to access the stomach percutaneously beneath the left subcostal margin. Needle position was confirmed within the stomach under biplane fluoroscopy. Contrast injection confirmed position also. A single T tack was deployed for gastropexy. Over an Amplatz guide wire, a 9-French sheath was inserted into the stomach. A snare device was utilized to capture the oral gastric catheter. The snare device was pulled  retrograde from the stomach up the esophagus and out the oropharynx. The 20-French pull-through gastrostomy was connected to the snare device and pulled antegrade through the oropharynx down the esophagus into the stomach and then through the percutaneous tract external to the patient. The gastrostomy was assembled externally. Contrast injection confirms position in the stomach. Images were obtained for documentation. The patient tolerated procedure well. No immediate complication. IMPRESSION: Fluoroscopic insertion of a 20-French "pull-through" gastrostomy. Electronically Signed   By: Judie Petit.  Shick M.D.   On: 11/28/2017 09:20    Lab Data:  CBC: Recent Labs  Lab 11/30/17 0651  WBC 8.7  HGB 10.9*  HCT 34.2*  MCV 93.4  PLT 271   Basic Metabolic Panel: Recent Labs  Lab 11/25/17 1241 11/30/17 0651  NA 139 141  K 4.1 3.2*  CL 101 102  CO2 30 32  GLUCOSE 135* 216*  BUN 32* 38*  CREATININE 0.73 0.79  CALCIUM 9.5 9.3  MG 2.1  --   PHOS 3.2 2.5   GFR: Estimated Creatinine Clearance: 46.2 mL/min (by C-G formula based on SCr of 0.79 mg/dL). Liver  Function Tests: Recent Labs  Lab 11/30/17 0651  ALBUMIN 2.6*   No results for input(s): LIPASE, AMYLASE in the last 168 hours. No results for input(s): AMMONIA in the last 168 hours. Coagulation Profile: Recent Labs  Lab 11/28/17 0514  INR 1.05   Cardiac Enzymes: No results for input(s): CKTOTAL, CKMB, CKMBINDEX, TROPONINI in the last 168 hours. BNP (last 3 results) No results for input(s): PROBNP in the last 8760 hours. HbA1C: No results for input(s): HGBA1C in the last 72 hours. CBG: Recent Labs  Lab 11/30/17 1252 11/30/17 1744 11/30/17 2138 12/01/17 0806 12/01/17 1114  GLUCAP 146* 166* 75 164* 176*   Lipid Profile: No results for input(s): CHOL, HDL, LDLCALC, TRIG, CHOLHDL, LDLDIRECT in the last 72 hours. Thyroid Function Tests: No results for input(s): TSH, T4TOTAL, FREET4, T3FREE, THYROIDAB in the last 72 hours. Anemia Panel: No results for input(s): VITAMINB12, FOLATE, FERRITIN, TIBC, IRON, RETICCTPCT in the last 72 hours. Urine analysis:    Component Value Date/Time   COLORURINE AMBER (A) 10/23/2017 0106   APPEARANCEUR CLOUDY (A) 10/23/2017 0106   LABSPEC 1.009 10/23/2017 0106   PHURINE 9.0 (H) 10/23/2017 0106   GLUCOSEU NEGATIVE 10/23/2017 0106   HGBUR MODERATE (A) 10/23/2017 0106   BILIRUBINUR NEGATIVE 10/23/2017 0106   KETONESUR NEGATIVE 10/23/2017 0106   PROTEINUR NEGATIVE 10/23/2017 0106   UROBILINOGEN 0.2 12/15/2006 1654   NITRITE NEGATIVE 10/23/2017 0106   LEUKOCYTESUR MODERATE (A) 10/23/2017 0106     Briceida Rasberry M.D. [facility] 12/01/2017, 2:50 PM  Pager: [phone] Between 7am to 7pm - call Pager - [phone]  After 7pm go to [email or web address] - password [email or web address]  Call night coverage person covering after 7pm

## 2017-12-02 DIAGNOSIS — R627 Adult failure to thrive: Secondary | ICD-10-CM

## 2017-12-02 DIAGNOSIS — N19 Unspecified kidney failure: Secondary | ICD-10-CM

## 2017-12-02 DIAGNOSIS — R1310 Dysphagia, unspecified: Secondary | ICD-10-CM

## 2017-12-02 LAB — GLUCOSE, CAPILLARY
Glucose-Capillary: 89 mg/dL (ref 70–99)
Glucose-Capillary: 152 mg/dL — ABNORMAL HIGH (ref 70–99)
Glucose-Capillary: 66 mg/dL — ABNORMAL LOW (ref 70–99)
Glucose-Capillary: 82 mg/dL (ref 70–99)

## 2017-12-02 MED ORDER — OLANZAPINE 5 MG PO TBDP
5.0000 mg | ORAL_TABLET | Freq: Every day | ORAL | Status: DC
  Administered 2017-12-03 – 2017-12-12 (×10): 5 mg via ORAL
  Filled 2017-12-02 (×10): qty 1

## 2017-12-02 NOTE — Consult Note (Signed)
[facility] Psych Consult Progress Note  12/02/2017 1:14 PM Amanda Hall  MRN:  [id number] Subjective:   Amanda Hall was last seen by the psychiatry consult service on 11/14 for medication management. Zyprexa was increased by 2.5 mg daily. A PEG tube was placed on 11/8 due to poor PO intake in the setting of severe protein calorie malnutrition.   On interview, Amanda Hall reports that she feels "about the same" although she appears less anxious today. She is not tremulous. She continues to worry about going to another facility and reports, "I don't want to go." She reports that she has been trying to eat more. She reports eating carrots, green beans and roast beef today. She reports fair sleep. She denies SI, HI or AVH.   Principal Problem: MDD (major depressive disorder), recurrent severe, without psychosis (HCC) Diagnosis:   Patient Active Problem List   Diagnosis Date Noted  . Pressure injury of skin [L89.90] 11/22/2017  . Anxiety [F41.9] 10/17/2017  . Dysphagia [R13.10] 10/12/2017  . Severe malnutrition (HCC) [E43] 10/12/2017  . MDD (major depressive disorder), recurrent severe, without psychosis (HCC) [F33.2]   . Anorexia [R63.0] 10/05/2017  . Uremia [N19]   . Protein-calorie malnutrition, severe [E43] 08/29/2017  . Generalized anxiety disorder [F41.1] 05/26/2017  . MDD (major depressive disorder) [F32.9] 05/26/2017  . Hypercholesterolemia [E78.00] 03/22/2017  . Nonspecific (abnormal) findings on radiological and other examination of body structure [793] 12/26/2006  . NONSPECIFIC ABNORM FIND RAD&OTH EXAM LUNG FIELD [R93.0] 12/26/2006  . ANEMIA-IRON DEFICIENCY [D50.9] 12/15/2006  . Depression [F32.9] 12/15/2006  . Allergic rhinitis [J30.9] 12/15/2006   Total Time spent with patient: 15 minutes  Past Psychiatric History: MDD and anxiety.   Past Medical History:  Past Medical History:  Diagnosis Date  . Allergy   . Anemia   . Anxiety   . Depression   . Hemorrhoids     Past Surgical  History:  Procedure Laterality Date  . arm surgery     Left  . IR GASTROSTOMY TUBE MOD SED  11/28/2017  . TUBAL LIGATION     Family History:  Family History  Problem Relation Age of Onset  . Stroke Mother   . Hypertension Sister   . Cancer Neg Hx        breat and colon hx  . Diabetes Neg Hx        family   Family Psychiatric  History: Grandfather-depression and committed suicide.  Social History:  Social History   Substance and Sexual Activity  Alcohol Use No     Social History   Substance and Sexual Activity  Drug Use No    Social History   Socioeconomic History  . Marital status: Divorced    Spouse name: Not on file  . Number of children: Not on file  . Years of education: Not on file  . Highest education level: Not on file  Occupational History  . Not on file  Social Needs  . Financial resource strain: Not on file  . Food insecurity:    Worry: Not on file    Inability: Not on file  . Transportation needs:    Medical: Not on file    Non-medical: Not on file  Tobacco Use  . Smoking status: Never Smoker  . Smokeless tobacco: Never Used  Substance and Sexual Activity  . Alcohol use: No  . Drug use: No  . Sexual activity: Not Currently  Lifestyle  . Physical activity:    Days per week: Not on  file    Minutes per session: Not on file  . Stress: Not on file  Relationships  . Social connections:    Talks on phone: Not on file    Gets together: Not on file    Attends religious service: Not on file    Active member of club or organization: Not on file    Attends meetings of clubs or organizations: Not on file    Relationship status: Not on file  Other Topics Concern  . Not on file  Social History Narrative  . Not on file    Sleep: Fair  Appetite:  Poor  Current Medications: Current Facility-Administered Medications  Medication Dose Route Frequency Provider Last Rate Last Dose  . acetaminophen (TYLENOL) tablet 650 mg  650 mg Oral Q6H PRN  Standley Brooking, MD   650 mg at 12/02/17 1155   Or  . acetaminophen (TYLENOL) suppository 650 mg  650 mg Rectal Q6H PRN Standley Brooking, MD      . chlorhexidine (PERIDEX) 0.12 % solution 15 mL  15 mL Mouth Rinse BID Dow Adolph N, DO   15 mL at 12/02/17 0941  . clonazePAM (KLONOPIN) tablet 0.5 mg  0.5 mg Per Tube TID PRN Tyrone Nine, MD   0.5 mg at 12/01/17 2216  . enoxaparin (LOVENOX) injection 30 mg  30 mg Subcutaneous Q24H Narda Bonds, MD   30 mg at 12/02/17 0943  . feeding supplement (OSMOLITE 1.5 CAL) liquid 237 mL  237 mL Per Tube 5 X Daily Narda Bonds, MD   237 mL at 12/02/17 1156  . ferrous sulfate tablet 325 mg  325 mg Oral Q breakfast Darlin Drop, DO   325 mg at 12/02/17 4098  . fluvoxaMINE (LUVOX) tablet 150 mg  150 mg Per Tube QHS Blount, Andi Devon T, NP   150 mg at 12/01/17 2216  . free water 300 mL  300 mL Per Tube Q6H Hall, Carole N, DO   300 mL at 12/02/17 1200  . HYDROcodone-acetaminophen (NORCO/VICODIN) 5-325 MG per tablet 1-2 tablet  1-2 tablet Oral Q6H PRN Macon Large, NP   1 tablet at 11/29/17 2232  . labetalol (NORMODYNE,TRANDATE) injection 5 mg  5 mg Intravenous Q2H PRN Jerald Kief, MD   5 mg at 11/04/17 1133  . loperamide (IMODIUM) capsule 2 mg  2 mg Oral PRN Jerald Kief, MD   2 mg at 11/29/17 2232  . magnesium hydroxide (MILK OF MAGNESIA) suspension 30 mL  30 mL Per Tube Daily Blount, Xenia T, NP   30 mL at 12/02/17 0941  . magnesium oxide (MAG-OX) tablet 400 mg  400 mg Per Tube BID Regalado, Belkys A, MD   400 mg at 12/02/17 0939  . MEDLINE mouth rinse  15 mL Mouth Rinse q12n4p Dow Adolph N, DO   15 mL at 11/29/17 1612  . megestrol (MEGACE) 400 MG/10ML suspension 400 mg  400 mg Per Tube Daily Blount, Xenia T, NP   400 mg at 12/02/17 0941  . milk and molasses enema  1 enema Rectal Daily PRN Regalado, Belkys A, MD      . mirtazapine (REMERON SOL-TAB) disintegrating tablet 45 mg  45 mg Per Tube QHS Blount, Xenia T, NP   45 mg at 12/01/17  2217  . naphazoline-glycerin (CLEAR EYES REDNESS) ophth solution 2 drop  2 drop Both Eyes QID PRN Standley Brooking, MD      . nystatin (MYCOSTATIN) 100000 UNIT/ML suspension 500,000  Units  5 mL Oral QID Dow Adolph N, DO   500,000 Units at 12/02/17 1155  . OLANZapine zydis (ZYPREXA) disintegrating tablet 15 mg  15 mg Sublingual QHS Hall, Carole N, DO   15 mg at 12/01/17 2216  . OLANZapine zydis (ZYPREXA) disintegrating tablet 2.5 mg  2.5 mg Oral Daily Cherly Beach, DO   2.5 mg at 12/02/17 0940  . ondansetron (ZOFRAN) tablet 4 mg  4 mg Per Tube Q6H PRN Blount, Andi Devon T, NP       Or  . ondansetron (ZOFRAN) injection 4 mg  4 mg Intravenous Q6H PRN Audrea Muscat T, NP   4 mg at 11/15/17 0457  . phosphorus (K PHOS NEUTRAL) tablet 500 mg  500 mg Oral TID WC Tyrone Nine, MD   500 mg at 12/02/17 1151  . polyethylene glycol (MIRALAX / GLYCOLAX) packet 17 g  17 g Per Tube BID Audrea Muscat T, NP   17 g at 12/01/17 0956  . sennosides (SENOKOT) 8.8 MG/5ML syrup 5 mL  5 mL Per Tube QHS Blount, Xenia T, NP   5 mL at 12/01/17 2217  . simvastatin (ZOCOR) tablet 10 mg  10 mg Per Tube q1800 Audrea Muscat T, NP   10 mg at 12/01/17 1807  . sodium chloride flush (NS) 0.9 % injection 3 mL  3 mL Intravenous Q12H Standley Brooking, MD   3 mL at 12/02/17 0954  . thiamine (VITAMIN B-1) tablet 100 mg  100 mg Oral Daily Tyrone Nine, MD   100 mg at 12/02/17 0940  . vitamin C (ASCORBIC ACID) tablet 250 mg  250 mg Per Tube BID Tyrone Nine, MD   250 mg at 12/02/17 0940    Lab Results:  Results for orders placed or performed during the hospital encounter of 10/05/17 (from the past 48 hour(s))  Glucose, capillary     Status: Abnormal   Collection Time: 11/30/17  5:44 PM  Result Value Ref Range   Glucose-Capillary 166 (H) 70 - 99 mg/dL  Glucose, capillary     Status: None   Collection Time: 11/30/17  9:38 PM  Result Value Ref Range   Glucose-Capillary 75 70 - 99 mg/dL  Glucose, capillary     Status:  Abnormal   Collection Time: 12/01/17  8:06 AM  Result Value Ref Range   Glucose-Capillary 164 (H) 70 - 99 mg/dL  Glucose, capillary     Status: Abnormal   Collection Time: 12/01/17 11:14 AM  Result Value Ref Range   Glucose-Capillary 176 (H) 70 - 99 mg/dL  Glucose, capillary     Status: None   Collection Time: 12/01/17  5:09 PM  Result Value Ref Range   Glucose-Capillary 76 70 - 99 mg/dL  Glucose, capillary     Status: Abnormal   Collection Time: 12/01/17  8:44 PM  Result Value Ref Range   Glucose-Capillary 148 (H) 70 - 99 mg/dL  Glucose, capillary     Status: None   Collection Time: 12/02/17  7:40 AM  Result Value Ref Range   Glucose-Capillary 89 70 - 99 mg/dL  Glucose, capillary     Status: Abnormal   Collection Time: 12/02/17 11:11 AM  Result Value Ref Range   Glucose-Capillary 152 (H) 70 - 99 mg/dL    Blood Alcohol level:  Lab Results  Component Value Date   ETH <10 06/12/2017   ETH <10 05/25/2017    Musculoskeletal: Strength & Muscle Tone: Generalized weakness. Gait & Station: unable  to stand Patient leans: N/A  Psychiatric Specialty Exam: Physical Exam  Nursing note and vitals reviewed. Constitutional: She is oriented to person, place, and time. She appears well-developed.  Thin   HENT:  Head: Normocephalic and atraumatic.  Neck: Normal range of motion.  Respiratory: Effort normal.  Musculoskeletal: Normal range of motion.  Neurological: She is alert and oriented to person, place, and time.  Psychiatric: Her speech is normal and behavior is normal. Judgment and thought content normal. Her mood appears anxious (improving). Cognition and memory are impaired.    Review of Systems  Cardiovascular: Negative for chest pain.  Gastrointestinal: Negative for abdominal pain, constipation, diarrhea, nausea and vomiting.  Psychiatric/Behavioral: Negative for hallucinations, substance abuse and suicidal ideas. The patient is nervous/anxious. The patient does not have  insomnia.   All other systems reviewed and are negative.   Blood pressure 106/68, pulse 84, temperature 97.7 F (36.5 C), temperature source Oral, resp. rate 16, height 5\' 2"  (1.575 m), weight 43.1 kg, SpO2 100 %.Body mass index is 17.38 kg/m.  General Appearance: Fairly Groomed, elderly, cachetic, Caucasian female, wearing a hospital gown with hair in bun and lying in bed. NAD.  Eye Contact:  Fair  Speech:  Clear and Coherent and Normal Rate  Volume:  Normal  Mood:  Anxious  Affect:  Congruent  Thought Process:  Goal Directed, Linear and Descriptions of Associations: Intact  Orientation:  Full (Time, Place, and Person)  Thought Content:  Logical  Suicidal Thoughts:  No  Homicidal Thoughts:  No  Memory:  Immediate;   Fair Recent;   Fair Remote;   Fair  Judgement:  Impaired  Insight:  Shallow  Psychomotor Activity:  Normal  Concentration:  Concentration: Good and Attention Span: Good  Recall:  Good  Fund of Knowledge:  Fair  Language:  Good  Akathisia:  No  Handed:  Right  AIMS (if indicated):   N/A  Assets:  Housing Social Support  ADL's:  Impaired  Cognition:  Impaired due to psychiatric condition.   Sleep:   Fair   Assessment:  Coolidge BreezeDana Haven is a 67 y.o. female who was admitted with AKI secondary to poor PO intake. She continues to exhibit signs and symptoms of depression and anxiety including psychomotor retardation and poor appetite with significant weight loss requiring tube feeds and likely PEG tube placement.She appears less anxious today than compared to last week. Recommend further increasing Zyprexa for anxiety. She continues to warrant inpatient psychiatric hospitalization for stabilization and treatment. She may benefit from a non medication treatment modality such as ECT (if medically stable enough to undergo) since she appears to have refractory depression and anxiety.   Treatment Plan Summary: -Patient warrants inpatient psychiatric hospitalizationfor severe  depression and anxiety causing significant impairments in daily functioning with poor PO intake and severe malnutrition. -Continue bedside sitter. -Continue Zyprexa 15 mg qhs and increase Zyprexa 2.5 mg q am to 5 mg q am for mood stabilization and anxiety. -Continue Luvox 150 mg qhsfor depression and anxietyandRemeron 45 mg qhsfor depression and anxiety. -Continue Klonopin 0.5 mg TID PRN.  -EKG reviewed and QTc435 on 11/4. Please closely monitor when starting or increasing QTc prolonging agents. -Patient is IVC'd so she may not leave the hospital. -Willfollow patient as needed.   Cherly BeachJacqueline J Norman, DO 12/02/2017, 1:14 PM

## 2017-12-02 NOTE — Progress Notes (Addendum)
[facility]                                                                              Patient Demographics  Amanda Hall, is a 67 y.o. female, DOB - 06/02/1950, ZOX:[id number]  Admit date - 10/05/2017   Admitting Physician Briscoe Deutscherimothy S Opyd, MD  Outpatient Primary MD for the patient is Gordy SaversKwiatkowski, Peter F, MD  Outpatient specialists:   LOS - 58  days   Medical records reviewed and are as summarized below:    Chief Complaint  Patient presents with  . Failure To Thrive  . IVC       Brief summary   Amanda Hall is a 67 y.o. female with a history of depression, anxiety.  Patient presents secondary to confusion and agitation.  She is currently awaiting inpatient psychiatry placement for her severe depression and anxiety. G tube placed 11/18.   Assessment & Plan    Principal Problem:   MDD (major depressive disorder), recurrent severe -Psychiatry was consulted,Continue Klonopin, Luvox, Remeron, Zyprexa, thiamine -Continue safety sitter -No acute issues, followed by psychiatry, still needs inpatient psychiatry  Active Problems:    Anorexia with severe protein calorie malnutrition -Cortrack was placed during this admission, does not have capacity, per patient's sister next of kin, G-tube was placed 11/8 -Still on dysphagia 1 diet however not eating much -Tube feeds per dietitian  Sinus tachycardia Resolved  Acute kidney injury -Likely due to dehydration, anorexia, severe protein calorie malnutrition, resolved  Iron deficiency anemia Continue iron supplementation  Pressure injury of skin -Stage II, partial-thickness, left buttocks, not present on admission -Wound care per nursing  Code Status: Full code DVT Prophylaxis: Lovenox Family Communication: No family member at bedside   Disposition Plan: No acute issues, still waiting for inpatient psychiatry  Time Spent in minutes   15 minutes  Procedures:  G-tube placement on 11/19  Consultants:    Interventional radiology Psychiatry Cardiology Palliative care  Antimicrobials:      Medications  Scheduled Meds: . chlorhexidine  15 mL Mouth Rinse BID  . enoxaparin (LOVENOX) injection  30 mg Subcutaneous Q24H  . feeding supplement (OSMOLITE 1.5 CAL)  237 mL Per Tube 5 X Daily  . ferrous sulfate  325 mg Oral Q breakfast  . fluvoxaMINE  150 mg Per Tube QHS  . free water  300 mL Per Tube Q6H  . magnesium hydroxide  30 mL Per Tube Daily  . magnesium oxide  400 mg Per Tube BID  . mouth rinse  15 mL Mouth Rinse q12n4p  . megestrol  400 mg Per Tube Daily  . mirtazapine  45 mg Per Tube QHS  . nystatin  5 mL Oral QID  . OLANZapine zydis  15 mg Sublingual QHS  . OLANZapine zydis  2.5 mg Oral Daily  . phosphorus  500 mg Oral TID WC  . polyethylene glycol  17 g Per Tube BID  . sennosides  5 mL Per Tube QHS  . simvastatin  10 mg Per Tube q1800  . sodium chloride flush  3 mL Intravenous Q12H  . thiamine  100 mg Oral Daily  . vitamin C  250 mg Per Tube BID   Continuous Infusions: PRN Meds:.acetaminophen **OR** acetaminophen, clonazePAM, HYDROcodone-acetaminophen, labetalol, loperamide, milk and molasses, naphazoline-glycerin, ondansetron **OR** ondansetron (ZOFRAN) IV   Antibiotics   Anti-infectives (From admission, onward)   Start     Dose/Rate Route Frequency Ordered Stop   11/28/17 0915  ceFAZolin (ANCEF) IVPB 1 g/50 mL premix     1 g 100 mL/hr over 30 Minutes Intravenous To Radiology 11/28/17 0907 11/29/17 0915   11/28/17 0847  ceFAZolin (ANCEF) 2-4 GM/100ML-% IVPB    Note to Pharmacy:  Domenica Fail   : cabinet override      11/28/17 0847 11/28/17 1046   11/28/17 0845  ceFAZolin (ANCEF) powder 1 g  Status:  Discontinued     1 g Other To Surgery 11/28/17 0830 11/28/17 0907   10/23/17 0830  cefTRIAXone (ROCEPHIN) 1 g in sodium chloride 0.9 % 100 mL IVPB  Status:  Discontinued     1 g 200 mL/hr over 30 Minutes Intravenous Daily 10/23/17 0721 10/26/17 1400         Subjective:   Amanda Hall was seen and examined today.,  No acute issues overnight, arousable, denies any specific complaints.  Sitter at the bedside    Objective:   Vitals:   12/01/17 2044 12/01/17 2340 12/02/17 0421 12/02/17 0918  BP: 106/67  115/66 106/68  Pulse: 96 (!) 108 79 84  Resp: 20  16 16   Temp: 97.8 F (36.6 C)  98.2 F (36.8 C) 97.7 F (36.5 C)  TempSrc: Oral  Oral Oral  SpO2: 100% 100% 100% 100%  Weight: 43.1 kg     Height:        Intake/Output Summary (Last 24 hours) at 12/02/2017 1429 Last data filed at 12/02/2017 0100 Gross per 24 hour  Intake 220 ml  Output 1750 ml  Net -1530 ml     Wt Readings from Last 3 Encounters:  12/01/17 43.1 kg  08/28/17 49 kg  07/13/17 54.4 kg     Physical Exam  General: Somnolent but easily arousable  Eyes:  HEENT:    Cardiovascular: S1 S2 clear, RRR. No pedal edema b/l  Respiratory: CTA B  Gastrointestinal: Soft, nontender, nondistended, + bowel sounds  Ext: no pedal edema bilaterally  Neuro:   Musculoskeletal: No digital cyanosis, clubbing  Skin: No rashes  Psych:        Data Reviewed:  I have personally reviewed following labs and imaging studies  Micro Results No results found for this or any previous visit (from the past 240 hour(s)).  Radiology Reports Ct Abdomen Wo Contrast  Result Date: 11/25/2017 CLINICAL DATA:  Evaluate anatomy for potential percutaneous gastrostomy tube placement. EXAM: CT ABDOMEN WITHOUT CONTRAST TECHNIQUE: Multidetector CT imaging of the abdomen was performed following the standard protocol without IV contrast. COMPARISON:  Acute abdominal radiographic series - 04/18/2017 FINDINGS: Lack of intravenous contrast limits the ability to evaluate solid abdominal organs. The examination is further degraded secondary to patient respiratory artifact. Lower chest: Limited visualization of the lower thorax demonstrates minimal subsegmental atelectasis within the imaged  left lower lobe. No discrete focal airspace opacities. No pleural effusion. Normal heart size.  No pericardial effusion. Hepatobiliary: Normal hepatic contour. Normal noncontrast appearance of the gallbladder given degree of distention. No ascites. Pancreas: Normal noncontrast appearance of the pancreas Spleen: Normal noncontrast appearance the spleen Adrenals/Urinary Tract: Normal noncontrast appearance of the bilateral kidneys. No renal stones. No urine obstruction or perinephric stranding. Normal noncontrast appearance the adrenal glands. The urinary bladder  was not imaged. Stomach/Bowel: The anterior wall the stomach is well apposed against the ventral wall of the abdomen though note is made of hypertrophy of the lateral segment left lobe of the liver. Enteric tube is coiled with gastric lumen. No evidence of enteric obstruction. No pneumoperitoneum, pneumatosis or portal venous gas. Vascular/Lymphatic: Atherosclerotic plaque within the abdominal aorta. No definitive bulky retroperitoneal or mesenteric adenopathy on this noncontrast examination. Other: Mild diffuse body wall anasarca. Musculoskeletal: Severe DDD of L4-L5 with disc space height loss, endplate irregularity and small posteriorly directed disc osteophyte complex at this location. IMPRESSION: 1. Gastric anatomy amenable to percutaneous gastrostomy tube placement as indicated though note, there is hypertrophy of the left lobe of the liver and sonographic evaluation to demarcate the liver edge could be performed prior to attempted percutaneous gastrostomy tube placement as deemed appropriate by the performing interventional radiologist. 2.  Aortic Atherosclerosis (ICD10-I70.0). Electronically Signed   By: Simonne Come M.D.   On: 11/25/2017 17:01   Dg Abd 1 View  Result Date: 11/28/2017 CLINICAL DATA:  Evaluate anatomy prior to potential percutaneous gastrostomy tube placement. EXAM: ABDOMEN - 1 VIEW COMPARISON:  CT abdomen pelvis-11/25/2017  FINDINGS: Enteric contrast is seen within the distal small bowel and throughout the colon. There is no significant residual enteric contrast seen within the gastric lumen. No evidence of enteric obstruction. No supine evidence of pneumoperitoneum. No pneumatosis or portal venous gas. Atherosclerotic plaque within the abdominal aorta. No definitive abnormal intra-abdominal calcifications given overlying enteric barium. Weighted tip enteric tube overlies expected location of the gastric fundus. Limited visualization of the lower thorax demonstrates left basilar/retrocardiac heterogeneous opacities, likely atelectasis. No acute osseus abnormalities. IMPRESSION: Enteric contrast seen throughout the transverse colon. No evidence of enteric obstruction Electronically Signed   By: Simonne Come M.D.   On: 11/28/2017 08:03   Ir Gastrostomy Tube Mod Sed  Result Date: 11/28/2017 INDICATION: Dysphagia, malnutrition EXAM: FLUOROSCOPIC 20 FRENCH PULL-THROUGH GASTROSTOMY Date:  11/28/2017 11/28/2017 9:16 am Radiologist:  Judie Petit. Ruel Favors, MD Guidance:  Fluoroscopic MEDICATIONS: 1 g Ancef; Antibiotics were administered within 1 hour of the procedure. Glucagon 0.5 mg IV ANESTHESIA/SEDATION: Versed 1.5 mg IV; Fentanyl 75 mcg IV Moderate Sedation Time:  16 minutes The patient was continuously monitored during the procedure by the interventional radiology nurse under my direct supervision. CONTRAST:  10 cc-administered into the gastric lumen. FLUOROSCOPY TIME:  Fluoroscopy Time: 4 minutes 30 seconds (8 mGy). COMPLICATIONS: None immediate. PROCEDURE: Informed consent was obtained from the patient following explanation of the procedure, risks, benefits and alternatives. The patient understands, agrees and consents for the procedure. All questions were addressed. A time out was performed. Maximal barrier sterile technique utilized including caps, mask, sterile gowns, sterile gloves, large sterile drape, hand hygiene, and betadine prep.  The left upper quadrant was sterilely prepped and draped. An oral gastric catheter was inserted into the stomach under fluoroscopy. The existing nasogastric feeding tube was removed. Air was injected into the stomach for insufflation and visualization under fluoroscopy. The air distended stomach was confirmed beneath the anterior abdominal wall in the frontal and lateral projections. Under sterile conditions and local anesthesia, a 17 gauge trocar needle was utilized to access the stomach percutaneously beneath the left subcostal margin. Needle position was confirmed within the stomach under biplane fluoroscopy. Contrast injection confirmed position also. A single T tack was deployed for gastropexy. Over an Amplatz guide wire, a 9-French sheath was inserted into the stomach. A snare device was utilized to capture the oral gastric  catheter. The snare device was pulled retrograde from the stomach up the esophagus and out the oropharynx. The 20-French pull-through gastrostomy was connected to the snare device and pulled antegrade through the oropharynx down the esophagus into the stomach and then through the percutaneous tract external to the patient. The gastrostomy was assembled externally. Contrast injection confirms position in the stomach. Images were obtained for documentation. The patient tolerated procedure well. No immediate complication. IMPRESSION: Fluoroscopic insertion of a 20-French "pull-through" gastrostomy. Electronically Signed   By: Judie Petit.  Shick M.D.   On: 11/28/2017 09:20    Lab Data:  CBC: Recent Labs  Lab 11/30/17 0651  WBC 8.7  HGB 10.9*  HCT 34.2*  MCV 93.4  PLT 271   Basic Metabolic Panel: Recent Labs  Lab 11/30/17 0651  NA 141  K 3.2*  CL 102  CO2 32  GLUCOSE 216*  BUN 38*  CREATININE 0.79  CALCIUM 9.3  PHOS 2.5   GFR: Estimated Creatinine Clearance: 47.1 mL/min (by C-G formula based on SCr of 0.79 mg/dL). Liver Function Tests: Recent Labs  Lab 11/30/17 0651   ALBUMIN 2.6*   No results for input(s): LIPASE, AMYLASE in the last 168 hours. No results for input(s): AMMONIA in the last 168 hours. Coagulation Profile: Recent Labs  Lab 11/28/17 0514  INR 1.05   Cardiac Enzymes: No results for input(s): CKTOTAL, CKMB, CKMBINDEX, TROPONINI in the last 168 hours. BNP (last 3 results) No results for input(s): PROBNP in the last 8760 hours. HbA1C: No results for input(s): HGBA1C in the last 72 hours. CBG: Recent Labs  Lab 12/01/17 1114 12/01/17 1709 12/01/17 2044 12/02/17 0740 12/02/17 1111  GLUCAP 176* 76 148* 89 152*   Lipid Profile: No results for input(s): CHOL, HDL, LDLCALC, TRIG, CHOLHDL, LDLDIRECT in the last 72 hours. Thyroid Function Tests: No results for input(s): TSH, T4TOTAL, FREET4, T3FREE, THYROIDAB in the last 72 hours. Anemia Panel: No results for input(s): VITAMINB12, FOLATE, FERRITIN, TIBC, IRON, RETICCTPCT in the last 72 hours. Urine analysis:    Component Value Date/Time   COLORURINE AMBER (A) 10/23/2017 0106   APPEARANCEUR CLOUDY (A) 10/23/2017 0106   LABSPEC 1.009 10/23/2017 0106   PHURINE 9.0 (H) 10/23/2017 0106   GLUCOSEU NEGATIVE 10/23/2017 0106   HGBUR MODERATE (A) 10/23/2017 0106   BILIRUBINUR NEGATIVE 10/23/2017 0106   KETONESUR NEGATIVE 10/23/2017 0106   PROTEINUR NEGATIVE 10/23/2017 0106   UROBILINOGEN 0.2 12/15/2006 1654   NITRITE NEGATIVE 10/23/2017 0106   LEUKOCYTESUR MODERATE (A) 10/23/2017 0106     Blayden Conwell M.D. [facility] 12/02/2017, 2:29 PM  Pager: [phone] Between 7am to 7pm - call Pager - [phone]  After 7pm go to [email or web address] - password [email or web address]  Call night coverage person covering after 7pm

## 2017-12-03 LAB — GLUCOSE, CAPILLARY
GLUCOSE-CAPILLARY: 163 mg/dL — AB (ref 70–99)
GLUCOSE-CAPILLARY: 148 mg/dL — AB (ref 70–99)
Glucose-Capillary: 90 mg/dL (ref 70–99)
Glucose-Capillary: 96 mg/dL (ref 70–99)

## 2017-12-04 LAB — GLUCOSE, CAPILLARY
GLUCOSE-CAPILLARY: 200 mg/dL — AB (ref 70–99)
GLUCOSE-CAPILLARY: 80 mg/dL (ref 70–99)
Glucose-Capillary: 143 mg/dL — ABNORMAL HIGH (ref 70–99)
Glucose-Capillary: 95 mg/dL (ref 70–99)
Glucose-Capillary: 127 mg/dL — ABNORMAL HIGH (ref 70–99)

## 2017-12-04 NOTE — Progress Notes (Signed)
[facility]                                                                              Patient Demographics  Amanda Hall, is a 67 y.o. female, DOB - [birth year], WUJ:[id number]  Admit date - 10/05/2017   Admitting Physician Briscoe Deutscher, MD  Outpatient Primary MD for the patient is [name], MD  Outpatient specialists:   LOS - 60  days   Medical records reviewed and are as summarized below:    Chief Complaint  Patient presents with  . Failure To Thrive  . IVC       Brief summary   Amanda Hall is a 67 y.o. female with a history of depression, anxiety.  Patient presents secondary to confusion and agitation.  She is currently awaiting inpatient psychiatry placement for her severe depression and anxiety. G tube placed 11/18.   Assessment & Plan    Principal Problem:   MDD (major depressive disorder), recurrent severe -Psychiatry was consulted,Continue Klonopin, Luvox, Remeron, Zyprexa, thiamine -Patent attorney, still needs inpatient psychiatry -No acute complaints  Active Problems:    Anorexia with severe protein calorie malnutrition -Cortrack was placed during this admission, does not have capacity, per patient's sister next of kin, G-tube was placed 11/8 -Still on dysphagia 1 diet however not eating much -Tube feeds per dietitian  Sinus tachycardia Resolved  Acute kidney injury -Likely due to dehydration, anorexia, severe protein calorie malnutrition, resolved  Iron deficiency anemia Continue iron supplementation  Pressure injury of skin -Stage II, partial-thickness, left buttocks, not present on admission -Wound care per nursing  Code Status: Full code DVT Prophylaxis: Lovenox Family Communication: No family member at bedside   Disposition Plan: No acute complaints or issues, still awaiting for inpatient psychiatry placement  Time Spent in minutes   15 minutes  Procedures:  G-tube placement on  11/19  Consultants:   Interventional radiology Psychiatry Cardiology Palliative care  Antimicrobials:      Medications  Scheduled Meds: . chlorhexidine  15 mL Mouth Rinse BID  . enoxaparin (LOVENOX) injection  30 mg Subcutaneous Q24H  . feeding supplement (OSMOLITE 1.5 CAL)  237 mL Per Tube 5 X Daily  . ferrous sulfate  325 mg Oral Q breakfast  . fluvoxaMINE  150 mg Per Tube QHS  . free water  300 mL Per Tube Q6H  . magnesium hydroxide  30 mL Per Tube Daily  . magnesium oxide  400 mg Per Tube BID  . mouth rinse  15 mL Mouth Rinse q12n4p  . megestrol  400 mg Per Tube Daily  . mirtazapine  45 mg Per Tube QHS  . nystatin  5 mL Oral QID  . OLANZapine zydis  15 mg Sublingual QHS  . OLANZapine zydis  5 mg Oral Daily  . phosphorus  500 mg Oral TID WC  . polyethylene glycol  17 g Per Tube BID  . sennosides  5 mL Per Tube QHS  . simvastatin  10 mg Per Tube q1800  . sodium chloride flush  3 mL Intravenous Q12H  . thiamine  100 mg Oral Daily  . vitamin C  250 mg Per Tube BID   Continuous Infusions: PRN Meds:.acetaminophen **OR** acetaminophen, clonazePAM, HYDROcodone-acetaminophen, labetalol, loperamide, milk and molasses, naphazoline-glycerin, ondansetron **OR** ondansetron (ZOFRAN) IV   Antibiotics   Anti-infectives (From admission, onward)   Start     Dose/Rate Route Frequency Ordered Stop   11/28/17 0915  ceFAZolin (ANCEF) IVPB 1 g/50 mL premix     1 g 100 mL/hr over 30 Minutes Intravenous To Radiology 11/28/17 0907 11/29/17 0915   11/28/17 0847  ceFAZolin (ANCEF) 2-4 GM/100ML-% IVPB    Note to Pharmacy:  Domenica Fail   : cabinet override      11/28/17 0847 11/28/17 1046   11/28/17 0845  ceFAZolin (ANCEF) powder 1 g  Status:  Discontinued     1 g Other To Surgery 11/28/17 0830 11/28/17 0907   10/23/17 0830  cefTRIAXone (ROCEPHIN) 1 g in sodium chloride 0.9 % 100 mL IVPB  Status:  Discontinued     1 g 200 mL/hr over 30 Minutes Intravenous Daily 10/23/17 0721 10/26/17  1400        Subjective:   Amanda Hall was seen and examined today.,  Denies any specific complaints, alert and awake, sitter at the bedside.  Ate a lot of this morning.   Objective:   Vitals:   12/03/17 2100 12/03/17 2105 12/04/17 0500 12/04/17 0915  BP:  128/72  (!) 104/55  Pulse:  98  (!) 116  Resp:  18  18  Temp:  (!) 97.4 F (36.3 C)  98.2 F (36.8 C)  TempSrc:  Oral  Oral  SpO2:  98%  99%  Weight: 45.2 kg  45.2 kg   Height:        Intake/Output Summary (Last 24 hours) at 12/04/2017 1202 Last data filed at 12/04/2017 0804 Gross per 24 hour  Intake 50 ml  Output 900 ml  Net -850 ml     Wt Readings from Last 3 Encounters:  12/04/17 45.2 kg  08/28/17 49 kg  07/13/17 54.4 kg    Physical Exam  General: Alert and oriented to self and place, NAD, cachectic ill-appearing  Eyes:   HEENT:    Cardiovascular: S1 S2 clear, RRR . No pedal edema b/l  Respiratory: CTA B  Gastrointestinal: Soft, nontender, nondistended, + bowel sounds, PEG tube  Ext: no pedal edema bilaterally  Neuro: No new deficits  Musculoskeletal  Skin: No rashes  Psych: Very anxious         Data Reviewed:  I have personally reviewed following labs and imaging studies  Micro Results No results found for this or any previous visit (from the past 240 hour(s)).  Radiology Reports Ct Abdomen Wo Contrast  Result Date: 11/25/2017 CLINICAL DATA:  Evaluate anatomy for potential percutaneous gastrostomy tube placement. EXAM: CT ABDOMEN WITHOUT CONTRAST TECHNIQUE: Multidetector CT imaging of the abdomen was performed following the standard protocol without IV contrast. COMPARISON:  Acute abdominal radiographic series - 04/18/2017 FINDINGS: Lack of intravenous contrast limits the ability to evaluate solid abdominal organs. The examination is further degraded secondary to patient respiratory artifact. Lower chest: Limited visualization of the lower thorax demonstrates minimal subsegmental  atelectasis within the imaged left lower lobe. No discrete focal airspace opacities. No pleural effusion. Normal heart size.  No pericardial effusion. Hepatobiliary: Normal hepatic contour. Normal noncontrast appearance of the gallbladder given degree of distention. No ascites. Pancreas: Normal noncontrast appearance of the pancreas Spleen: Normal noncontrast appearance the spleen Adrenals/Urinary Tract: Normal noncontrast appearance of the bilateral kidneys. No renal stones. No urine  obstruction or perinephric stranding. Normal noncontrast appearance the adrenal glands. The urinary bladder was not imaged. Stomach/Bowel: The anterior wall the stomach is well apposed against the ventral wall of the abdomen though note is made of hypertrophy of the lateral segment left lobe of the liver. Enteric tube is coiled with gastric lumen. No evidence of enteric obstruction. No pneumoperitoneum, pneumatosis or portal venous gas. Vascular/Lymphatic: Atherosclerotic plaque within the abdominal aorta. No definitive bulky retroperitoneal or mesenteric adenopathy on this noncontrast examination. Other: Mild diffuse body wall anasarca. Musculoskeletal: Severe DDD of L4-L5 with disc space height loss, endplate irregularity and small posteriorly directed disc osteophyte complex at this location. IMPRESSION: 1. Gastric anatomy amenable to percutaneous gastrostomy tube placement as indicated though note, there is hypertrophy of the left lobe of the liver and sonographic evaluation to demarcate the liver edge could be performed prior to attempted percutaneous gastrostomy tube placement as deemed appropriate by the performing interventional radiologist. 2.  Aortic Atherosclerosis (ICD10-I70.0). Electronically Signed   By: Simonne ComeJohn  Watts M.D.   On: 11/25/2017 17:01   Dg Abd 1 View  Result Date: 11/28/2017 CLINICAL DATA:  Evaluate anatomy prior to potential percutaneous gastrostomy tube placement. EXAM: ABDOMEN - 1 VIEW COMPARISON:  CT  abdomen pelvis-11/25/2017 FINDINGS: Enteric contrast is seen within the distal small bowel and throughout the colon. There is no significant residual enteric contrast seen within the gastric lumen. No evidence of enteric obstruction. No supine evidence of pneumoperitoneum. No pneumatosis or portal venous gas. Atherosclerotic plaque within the abdominal aorta. No definitive abnormal intra-abdominal calcifications given overlying enteric barium. Weighted tip enteric tube overlies expected location of the gastric fundus. Limited visualization of the lower thorax demonstrates left basilar/retrocardiac heterogeneous opacities, likely atelectasis. No acute osseus abnormalities. IMPRESSION: Enteric contrast seen throughout the transverse colon. No evidence of enteric obstruction Electronically Signed   By: Simonne ComeJohn  Watts M.D.   On: 11/28/2017 08:03   Ir Gastrostomy Tube Mod Sed  Result Date: 11/28/2017 INDICATION: Dysphagia, malnutrition EXAM: FLUOROSCOPIC 20 FRENCH PULL-THROUGH GASTROSTOMY Date:  11/28/2017 11/28/2017 9:16 am Radiologist:  Judie PetitM. Ruel Favorsrevor Shick, MD Guidance:  Fluoroscopic MEDICATIONS: 1 g Ancef; Antibiotics were administered within 1 hour of the procedure. Glucagon 0.5 mg IV ANESTHESIA/SEDATION: Versed 1.5 mg IV; Fentanyl 75 mcg IV Moderate Sedation Time:  16 minutes The patient was continuously monitored during the procedure by the interventional radiology nurse under my direct supervision. CONTRAST:  10 cc-administered into the gastric lumen. FLUOROSCOPY TIME:  Fluoroscopy Time: 4 minutes 30 seconds (8 mGy). COMPLICATIONS: None immediate. PROCEDURE: Informed consent was obtained from the patient following explanation of the procedure, risks, benefits and alternatives. The patient understands, agrees and consents for the procedure. All questions were addressed. A time out was performed. Maximal barrier sterile technique utilized including caps, mask, sterile gowns, sterile gloves, large sterile drape, hand  hygiene, and betadine prep. The left upper quadrant was sterilely prepped and draped. An oral gastric catheter was inserted into the stomach under fluoroscopy. The existing nasogastric feeding tube was removed. Air was injected into the stomach for insufflation and visualization under fluoroscopy. The air distended stomach was confirmed beneath the anterior abdominal wall in the frontal and lateral projections. Under sterile conditions and local anesthesia, a 17 gauge trocar needle was utilized to access the stomach percutaneously beneath the left subcostal margin. Needle position was confirmed within the stomach under biplane fluoroscopy. Contrast injection confirmed position also. A single T tack was deployed for gastropexy. Over an Amplatz guide wire, a 9-French sheath was inserted  into the stomach. A snare device was utilized to capture the oral gastric catheter. The snare device was pulled retrograde from the stomach up the esophagus and out the oropharynx. The 20-French pull-through gastrostomy was connected to the snare device and pulled antegrade through the oropharynx down the esophagus into the stomach and then through the percutaneous tract external to the patient. The gastrostomy was assembled externally. Contrast injection confirms position in the stomach. Images were obtained for documentation. The patient tolerated procedure well. No immediate complication. IMPRESSION: Fluoroscopic insertion of a 20-French "pull-through" gastrostomy. Electronically Signed   By: Judie Petit.  Shick M.D.   On: 11/28/2017 09:20    Lab Data:  CBC: Recent Labs  Lab 11/30/17 0651  WBC 8.7  HGB 10.9*  HCT 34.2*  MCV 93.4  PLT 271   Basic Metabolic Panel: Recent Labs  Lab 11/30/17 0651  NA 141  K 3.2*  CL 102  CO2 32  GLUCOSE 216*  BUN 38*  CREATININE 0.79  CALCIUM 9.3  PHOS 2.5   GFR: Estimated Creatinine Clearance: 49.4 mL/min (by C-G formula based on SCr of 0.79 mg/dL). Liver Function Tests: Recent  Labs  Lab 11/30/17 0651  ALBUMIN 2.6*   No results for input(s): LIPASE, AMYLASE in the last 168 hours. No results for input(s): AMMONIA in the last 168 hours. Coagulation Profile: Recent Labs  Lab 11/28/17 0514  INR 1.05   Cardiac Enzymes: No results for input(s): CKTOTAL, CKMB, CKMBINDEX, TROPONINI in the last 168 hours. BNP (last 3 results) No results for input(s): PROBNP in the last 8760 hours. HbA1C: No results for input(s): HGBA1C in the last 72 hours. CBG: Recent Labs  Lab 12/03/17 0817 12/03/17 1208 12/03/17 1618 12/03/17 2107 12/04/17 0918  GLUCAP 163* 148* 90 96 200*   Lipid Profile: No results for input(s): CHOL, HDL, LDLCALC, TRIG, CHOLHDL, LDLDIRECT in the last 72 hours. Thyroid Function Tests: No results for input(s): TSH, T4TOTAL, FREET4, T3FREE, THYROIDAB in the last 72 hours. Anemia Panel: No results for input(s): VITAMINB12, FOLATE, FERRITIN, TIBC, IRON, RETICCTPCT in the last 72 hours. Urine analysis:    Component Value Date/Time   COLORURINE AMBER (A) 10/23/2017 0106   APPEARANCEUR CLOUDY (A) 10/23/2017 0106   LABSPEC 1.009 10/23/2017 0106   PHURINE 9.0 (H) 10/23/2017 0106   GLUCOSEU NEGATIVE 10/23/2017 0106   HGBUR MODERATE (A) 10/23/2017 0106   BILIRUBINUR NEGATIVE 10/23/2017 0106   KETONESUR NEGATIVE 10/23/2017 0106   PROTEINUR NEGATIVE 10/23/2017 0106   UROBILINOGEN 0.2 12/15/2006 1654   NITRITE NEGATIVE 10/23/2017 0106   LEUKOCYTESUR MODERATE (A) 10/23/2017 0106     Ripudeep Rai M.D. [facility] 12/04/2017, 12:02 PM  Pager: [phone] Between 7am to 7pm - call Pager - [phone]  After 7pm go to [email or web address] - password [email or web address]  Call night coverage person covering after 7pm

## 2017-12-04 NOTE — Plan of Care (Signed)
Problem: Spiritual Needs Goal: Ability to function at adequate level Outcome: Not Progressing     

## 2017-12-05 LAB — GLUCOSE, CAPILLARY
GLUCOSE-CAPILLARY: 147 mg/dL — AB (ref 70–99)
GLUCOSE-CAPILLARY: 182 mg/dL — AB (ref 70–99)
GLUCOSE-CAPILLARY: 181 mg/dL — AB (ref 70–99)

## 2017-12-05 NOTE — Clinical Social Work Note (Signed)
[facility] and confirmed with [facility] that patient remains on wait list for a bed. Updated clinicals requested by [facility] and [facility]. IVC paperwork updated today and placed in patient's chart. CSW will continue to follow and update IVC paperwork on 12/2 if patient remains in hospital.  Genelle BalVanessa Zakhari Fogel, MSW, LCSW Licensed Clinical Social Worker Clinical Social Work Department [facility] [phone]

## 2017-12-05 NOTE — Progress Notes (Addendum)
[facility]                                                                              Patient Demographics  Amanda Hall, is a 67 y.o. female, DOB - June 12, 1950, ZOX:[id number]  Admit date - 10/05/2017   Admitting Physician Briscoe Deutscher, MD  Outpatient Primary MD for the patient is [name], MD  Outpatient specialists:   LOS - 61  days   Medical records reviewed and are as summarized below:    Chief Complaint  Patient presents with  . Failure To Thrive  . IVC       Brief summary   Amanda Hall is a 67 y.o. female with a history of depression, anxiety.  Patient presents secondary to confusion and agitation.  She is currently awaiting inpatient psychiatry placement for her severe depression and anxiety. G tube placed 11/18.   Assessment & Plan    Principal Problem:   MDD (major depressive disorder), recurrent severe -Psychiatry was consulted,Continue Klonopin, Luvox, Remeron, Zyprexa, thiamine -Having anxiety attack at the time of my encounter, still needs inpatient psychiatry facility -Continue safety sitter -IVC paperwork completed  Active Problems:    Anorexia with severe protein calorie malnutrition -Cortrack was placed during this admission, does not have capacity, per patient's sister next of kin, G-tube was placed 11/8 -Still on dysphagia 1 diet however not eating much -Tube feeds per dietitian  Sinus tachycardia Likely due to anxiety  Acute kidney injury -Likely due to dehydration, anorexia, severe protein calorie malnutrition, resolved  Iron deficiency anemia Continue iron supplementation  Pressure injury of skin -Stage II, partial-thickness, left buttocks, not present on admission -Wound care per nursing  Code Status: Full code DVT Prophylaxis: Lovenox Family Communication: No family member at bedside   Disposition Plan: Still awaiting for inpatient psychiatry placement  Time Spent in minutes   15  minutes  Procedures:  G-tube placement on 11/19  Consultants:   Interventional radiology Psychiatry Cardiology Palliative care  Antimicrobials:      Medications  Scheduled Meds: . chlorhexidine  15 mL Mouth Rinse BID  . enoxaparin (LOVENOX) injection  30 mg Subcutaneous Q24H  . feeding supplement (OSMOLITE 1.5 CAL)  237 mL Per Tube 5 X Daily  . ferrous sulfate  325 mg Oral Q breakfast  . fluvoxaMINE  150 mg Per Tube QHS  . free water  300 mL Per Tube Q6H  . magnesium hydroxide  30 mL Per Tube Daily  . magnesium oxide  400 mg Per Tube BID  . mouth rinse  15 mL Mouth Rinse q12n4p  . megestrol  400 mg Per Tube Daily  . mirtazapine  45 mg Per Tube QHS  . nystatin  5 mL Oral QID  . OLANZapine zydis  15 mg Sublingual QHS  . OLANZapine zydis  5 mg Oral Daily  . phosphorus  500 mg Oral TID WC  . polyethylene glycol  17 g Per Tube BID  . sennosides  5 mL Per Tube QHS  . simvastatin  10 mg Per Tube q1800  . sodium chloride flush  3 mL Intravenous Q12H  . thiamine  100 mg Oral Daily  . vitamin C  250 mg Per Tube BID   Continuous Infusions: PRN Meds:.acetaminophen **OR** acetaminophen, clonazePAM, HYDROcodone-acetaminophen, labetalol, loperamide, milk and molasses, naphazoline-glycerin, ondansetron **OR** ondansetron (ZOFRAN) IV   Antibiotics   Anti-infectives (From admission, onward)   Start     Dose/Rate Route Frequency Ordered Stop   11/28/17 0915  ceFAZolin (ANCEF) IVPB 1 g/50 mL premix     1 g 100 mL/hr over 30 Minutes Intravenous To Radiology 11/28/17 0907 11/29/17 0915   11/28/17 0847  ceFAZolin (ANCEF) 2-4 GM/100ML-% IVPB    Note to Pharmacy:  Domenica Fail   : cabinet override      11/28/17 0847 11/28/17 1046   11/28/17 0845  ceFAZolin (ANCEF) powder 1 g  Status:  Discontinued     1 g Other To Surgery 11/28/17 0830 11/28/17 0907   10/23/17 0830  cefTRIAXone (ROCEPHIN) 1 g in sodium chloride 0.9 % 100 mL IVPB  Status:  Discontinued     1 g 200 mL/hr over 30  Minutes Intravenous Daily 10/23/17 0721 10/26/17 1400        Subjective:   Amanda Hall was seen and examined today.,  Anxious and shaking, tearful.  Denies any specific complaints, ate a little this morning per the sitter.  Objective:   Vitals:   12/04/17 0915 12/04/17 2015 12/05/17 0407 12/05/17 0800  BP: (!) 104/55 118/72 132/75 (!) 96/59  Pulse: (!) 116 (!) 103 91 (!) 110  Resp: 18 20 18 18   Temp: 98.2 F (36.8 C) 98 F (36.7 C) 98.1 F (36.7 C)   TempSrc: Oral     SpO2: 99% 100% 100% 100%  Weight:      Height:        Intake/Output Summary (Last 24 hours) at 12/05/2017 1502 Last data filed at 12/05/2017 1020 Gross per 24 hour  Intake 163 ml  Output 450 ml  Net -287 ml     Wt Readings from Last 3 Encounters:  12/04/17 45.2 kg  08/28/17 49 kg  07/13/17 54.4 kg     Physical Exam  General: Oriented to self, anxious,  Eyes:   HEENT:   Cardiovascular: S1 S2 auscultated, sinus tachycardia, RRR,No pedal edema b/l  Respiratory: Refused to be examined  Gastrointestinal: Refused to be examined  Ext: no pedal edema bilaterally  Neuro: Moving all 4 extremities  Musculoskeletal: No digital cyanosis, clubbing  Skin: No rashes  Psych: Very anxious        Data Reviewed:  I have personally reviewed following labs and imaging studies  Micro Results No results found for this or any previous visit (from the past 240 hour(s)).  Radiology Reports Ct Abdomen Wo Contrast  Result Date: 11/25/2017 CLINICAL DATA:  Evaluate anatomy for potential percutaneous gastrostomy tube placement. EXAM: CT ABDOMEN WITHOUT CONTRAST TECHNIQUE: Multidetector CT imaging of the abdomen was performed following the standard protocol without IV contrast. COMPARISON:  Acute abdominal radiographic series - 04/18/2017 FINDINGS: Lack of intravenous contrast limits the ability to evaluate solid abdominal organs. The examination is further degraded secondary to patient respiratory  artifact. Lower chest: Limited visualization of the lower thorax demonstrates minimal subsegmental atelectasis within the imaged left lower lobe. No discrete focal airspace opacities. No pleural effusion. Normal heart size.  No pericardial effusion. Hepatobiliary: Normal hepatic contour. Normal noncontrast appearance of the gallbladder given degree of distention. No ascites. Pancreas: Normal noncontrast appearance of the pancreas Spleen: Normal noncontrast appearance the spleen Adrenals/Urinary Tract: Normal noncontrast appearance of the bilateral  kidneys. No renal stones. No urine obstruction or perinephric stranding. Normal noncontrast appearance the adrenal glands. The urinary bladder was not imaged. Stomach/Bowel: The anterior wall the stomach is well apposed against the ventral wall of the abdomen though note is made of hypertrophy of the lateral segment left lobe of the liver. Enteric tube is coiled with gastric lumen. No evidence of enteric obstruction. No pneumoperitoneum, pneumatosis or portal venous gas. Vascular/Lymphatic: Atherosclerotic plaque within the abdominal aorta. No definitive bulky retroperitoneal or mesenteric adenopathy on this noncontrast examination. Other: Mild diffuse body wall anasarca. Musculoskeletal: Severe DDD of L4-L5 with disc space height loss, endplate irregularity and small posteriorly directed disc osteophyte complex at this location. IMPRESSION: 1. Gastric anatomy amenable to percutaneous gastrostomy tube placement as indicated though note, there is hypertrophy of the left lobe of the liver and sonographic evaluation to demarcate the liver edge could be performed prior to attempted percutaneous gastrostomy tube placement as deemed appropriate by the performing interventional radiologist. 2.  Aortic Atherosclerosis (ICD10-I70.0). Electronically Signed   By: Simonne ComeJohn  Watts M.D.   On: 11/25/2017 17:01   Dg Abd 1 View  Result Date: 11/28/2017 CLINICAL DATA:  Evaluate anatomy  prior to potential percutaneous gastrostomy tube placement. EXAM: ABDOMEN - 1 VIEW COMPARISON:  CT abdomen pelvis-11/25/2017 FINDINGS: Enteric contrast is seen within the distal small bowel and throughout the colon. There is no significant residual enteric contrast seen within the gastric lumen. No evidence of enteric obstruction. No supine evidence of pneumoperitoneum. No pneumatosis or portal venous gas. Atherosclerotic plaque within the abdominal aorta. No definitive abnormal intra-abdominal calcifications given overlying enteric barium. Weighted tip enteric tube overlies expected location of the gastric fundus. Limited visualization of the lower thorax demonstrates left basilar/retrocardiac heterogeneous opacities, likely atelectasis. No acute osseus abnormalities. IMPRESSION: Enteric contrast seen throughout the transverse colon. No evidence of enteric obstruction Electronically Signed   By: Simonne ComeJohn  Watts M.D.   On: 11/28/2017 08:03   Ir Gastrostomy Tube Mod Sed  Result Date: 11/28/2017 INDICATION: Dysphagia, malnutrition EXAM: FLUOROSCOPIC 20 FRENCH PULL-THROUGH GASTROSTOMY Date:  11/28/2017 11/28/2017 9:16 am Radiologist:  Judie PetitM. Ruel Favorsrevor Shick, MD Guidance:  Fluoroscopic MEDICATIONS: 1 g Ancef; Antibiotics were administered within 1 hour of the procedure. Glucagon 0.5 mg IV ANESTHESIA/SEDATION: Versed 1.5 mg IV; Fentanyl 75 mcg IV Moderate Sedation Time:  16 minutes The patient was continuously monitored during the procedure by the interventional radiology nurse under my direct supervision. CONTRAST:  10 cc-administered into the gastric lumen. FLUOROSCOPY TIME:  Fluoroscopy Time: 4 minutes 30 seconds (8 mGy). COMPLICATIONS: None immediate. PROCEDURE: Informed consent was obtained from the patient following explanation of the procedure, risks, benefits and alternatives. The patient understands, agrees and consents for the procedure. All questions were addressed. A time out was performed. Maximal barrier sterile  technique utilized including caps, mask, sterile gowns, sterile gloves, large sterile drape, hand hygiene, and betadine prep. The left upper quadrant was sterilely prepped and draped. An oral gastric catheter was inserted into the stomach under fluoroscopy. The existing nasogastric feeding tube was removed. Air was injected into the stomach for insufflation and visualization under fluoroscopy. The air distended stomach was confirmed beneath the anterior abdominal wall in the frontal and lateral projections. Under sterile conditions and local anesthesia, a 17 gauge trocar needle was utilized to access the stomach percutaneously beneath the left subcostal margin. Needle position was confirmed within the stomach under biplane fluoroscopy. Contrast injection confirmed position also. A single T tack was deployed for gastropexy. Over an Amplatz guide  wire, a 9-French sheath was inserted into the stomach. A snare device was utilized to capture the oral gastric catheter. The snare device was pulled retrograde from the stomach up the esophagus and out the oropharynx. The 20-French pull-through gastrostomy was connected to the snare device and pulled antegrade through the oropharynx down the esophagus into the stomach and then through the percutaneous tract external to the patient. The gastrostomy was assembled externally. Contrast injection confirms position in the stomach. Images were obtained for documentation. The patient tolerated procedure well. No immediate complication. IMPRESSION: Fluoroscopic insertion of a 20-French "pull-through" gastrostomy. Electronically Signed   By: Judie Petit.  Shick M.D.   On: 11/28/2017 09:20    Lab Data:  CBC: Recent Labs  Lab 11/30/17 0651  WBC 8.7  HGB 10.9*  HCT 34.2*  MCV 93.4  PLT 271   Basic Metabolic Panel: Recent Labs  Lab 11/30/17 0651  NA 141  K 3.2*  CL 102  CO2 32  GLUCOSE 216*  BUN 38*  CREATININE 0.79  CALCIUM 9.3  PHOS 2.5   GFR: Estimated Creatinine  Clearance: 49.4 mL/min (by C-G formula based on SCr of 0.79 mg/dL). Liver Function Tests: Recent Labs  Lab 11/30/17 0651  ALBUMIN 2.6*   No results for input(s): LIPASE, AMYLASE in the last 168 hours. No results for input(s): AMMONIA in the last 168 hours. Coagulation Profile: No results for input(s): INR, PROTIME in the last 168 hours. Cardiac Enzymes: No results for input(s): CKTOTAL, CKMB, CKMBINDEX, TROPONINI in the last 168 hours. BNP (last 3 results) No results for input(s): PROBNP in the last 8760 hours. HbA1C: No results for input(s): HGBA1C in the last 72 hours. CBG: Recent Labs  Lab 12/04/17 1239 12/04/17 1709 12/04/17 2015 12/05/17 0752 12/05/17 1135  GLUCAP 95 80 127* 147* 182*   Lipid Profile: No results for input(s): CHOL, HDL, LDLCALC, TRIG, CHOLHDL, LDLDIRECT in the last 72 hours. Thyroid Function Tests: No results for input(s): TSH, T4TOTAL, FREET4, T3FREE, THYROIDAB in the last 72 hours. Anemia Panel: No results for input(s): VITAMINB12, FOLATE, FERRITIN, TIBC, IRON, RETICCTPCT in the last 72 hours. Urine analysis:    Component Value Date/Time   COLORURINE AMBER (A) 10/23/2017 0106   APPEARANCEUR CLOUDY (A) 10/23/2017 0106   LABSPEC 1.009 10/23/2017 0106   PHURINE 9.0 (H) 10/23/2017 0106   GLUCOSEU NEGATIVE 10/23/2017 0106   HGBUR MODERATE (A) 10/23/2017 0106   BILIRUBINUR NEGATIVE 10/23/2017 0106   KETONESUR NEGATIVE 10/23/2017 0106   PROTEINUR NEGATIVE 10/23/2017 0106   UROBILINOGEN 0.2 12/15/2006 1654   NITRITE NEGATIVE 10/23/2017 0106   LEUKOCYTESUR MODERATE (A) 10/23/2017 0106       M.D. [facility] 12/05/2017, 3:02 PM  Pager: [phone] Between 7am to 7pm - call Pager - [phone]  After 7pm go to [email or web address] - password [email or web address]  Call night coverage person covering after 7pm

## 2017-12-06 LAB — GLUCOSE, CAPILLARY
Glucose-Capillary: 166 mg/dL — ABNORMAL HIGH (ref 70–99)
Glucose-Capillary: 151 mg/dL — ABNORMAL HIGH (ref 70–99)
Glucose-Capillary: 94 mg/dL (ref 70–99)
Glucose-Capillary: 95 mg/dL (ref 70–99)

## 2017-12-06 NOTE — Progress Notes (Signed)
Occupational Therapy Treatment Patient Details Name: Candia Kingsbury MRN: [id number] DOB: 11-26-1950 Today's Date: 12/06/2017    History of present illness Pt is a 67 y/o female admitted secondary to major depressive disorder. Pt with increased confusion and agitation prior to admission. Found to have AKI and acute encephalopathy. CT negative for acute abnormality. PMH includes anxiety and depression.    OT comments  Pt progressing physically toward OT goals however is still significantly limited anxiety. Pt with increased RR throughout session, pt guided through breathing exercises with visual cueing and demonstration. Pt provided with leisure tasks to complete at bed level. Pt requires heavy burden of care at this time, to d/c to SNF or psych hospital as indicated in chart.    Follow Up Recommendations  Other (comment)(psych hospital, 24/7 (S))    Equipment Recommendations  Other (comment)(defer to next venue)       Precautions / Restrictions Precautions Precautions: Fall Precaution Comments: Recruitment consultant Restrictions Weight Bearing Restrictions: No       Mobility Bed Mobility Overal bed mobility: Needs Assistance Bed Mobility: Sit to Supine Rolling: Min guard         General bed mobility comments: Pt came to EOB with min guard, tactile cues and vc for initiation/technique  Transfers                 General transfer comment: declined OOB mobility stating she was too scared    Balance Overall balance assessment: Needs assistance Sitting-balance support: Bilateral upper extremity supported Sitting balance-Leahy Scale: Fair Sitting balance - Comments: Unable to assess                                   ADL either performed or assessed with clinical judgement   Cognition Arousal/Alertness: Awake/alert Behavior During Therapy: Restless;Anxious Overall Cognitive Status: No family/caregiver present to determine baseline cognitive functioning                                 General Comments: Pt very anxious, begging therapist to not leave at end saying "I don't know what I'll do if you leave:              General Comments sitter present during session    Pertinent Vitals/ Pain       Pain Assessment: 0-10 Pain Score: 6  Pain Location: L shoulder with AROM Pain Descriptors / Indicators: Grimacing Pain Intervention(s): Limited activity within patient's tolerance         Frequency  Min 2X/week        Progress Toward Goals  OT Goals(current goals can now be found in the care plan section)  Progress towards OT goals: Progressing toward goals  Acute Rehab OT Goals Patient Stated Goal: did not state OT Goal Formulation: Patient unable to participate in goal setting Time For Goal Achievement: 12/18/17 Potential to Achieve Goals: Fair  Plan Discharge plan remains appropriate       AM-PAC OT "6 Clicks" Daily Activity     Outcome Measure   Help from another person eating meals?: A Lot Help from another person taking care of personal grooming?: A Lot Help from another person toileting, which includes using toliet, bedpan, or urinal?: Total Help from another person bathing (including washing, rinsing, drying)?: Total Help from another person to put on and taking off regular upper body clothing?: Total Help  from another person to put on and taking off regular lower body clothing?: Total 6 Click Score: 8    End of Session    OT Visit Diagnosis: Other abnormalities of gait and mobility (R26.89);Muscle weakness (generalized) (M62.81);Pain   Activity Tolerance Patient limited by fatigue   Patient Left in bed;with nursing/sitter in room           Time: 1337-1400 OT Time Calculation (min): 23 min  Charges: OT General Charges $OT Visit: 1 Visit OT Treatments $Therapeutic Activity: 23-37 mins  Crissie ReeseSandra H Cristabel Bicknell OTR/L  12/06/2017, 2:18 PM

## 2017-12-06 NOTE — Progress Notes (Signed)
[facility]                                                                              Patient Demographics  Amanda Hall, is a 67 y.o. female, DOB - 03-30-1950, ZOX:[name]  Admit date - 10/05/2017   Admitting Physician Briscoe Deutscherimothy S Opyd, MD  Outpatient Primary MD for the patient is Gordy SaversKwiatkowski, Peter F, MD  Outpatient specialists:   LOS - 62  days   Medical records reviewed and are as summarized below:    Chief Complaint  Patient presents with  . Failure To Thrive  . IVC       Brief summary   Coolidge BreezeDana Hall is a 67 y.o. female with a history of depression, anxiety.  Patient presents secondary to confusion and agitation.  She is currently awaiting inpatient psychiatry placement for her severe depression and anxiety. G tube placed 11/18.   Assessment & Plan    Principal Problem:   MDD (major depressive disorder), recurrent severe -Psychiatry was consulted,Continue Klonopin, Luvox, Remeron, Zyprexa, thiamine -Continue safety sitter -IVC paperwork completed, no acute issues, still needs inpatient psych facility  Active Problems:    Anorexia with severe protein calorie malnutrition -Cortrack was placed during this admission, does not have capacity, per patient's sister next of kin, G-tube was placed 11/8 -Still on dysphagia 1 diet however not eating much -Tube feeds per dietitian  Sinus tachycardia Likely due to anxiety  Acute kidney injury -Likely due to dehydration, anorexia, severe protein calorie malnutrition, resolved  Iron deficiency anemia Continue iron supplementation  Pressure injury of skin -Stage II, partial-thickness, left buttocks, not present on admission -Wound care per nursing  Code Status: Full code DVT Prophylaxis: Lovenox Family Communication: No family member at bedside   Disposition Plan: No acute issues, awaiting inpatient psych facility  Time Spent in minutes   15 minutes  Procedures:  G-tube placement on  11/19  Consultants:   Interventional radiology Psychiatry Cardiology Palliative care  Antimicrobials:      Medications  Scheduled Meds: . chlorhexidine  15 mL Mouth Rinse BID  . enoxaparin (LOVENOX) injection  30 mg Subcutaneous Q24H  . feeding supplement (OSMOLITE 1.5 CAL)  237 mL Per Tube 5 X Daily  . ferrous sulfate  325 mg Oral Q breakfast  . fluvoxaMINE  150 mg Per Tube QHS  . free water  300 mL Per Tube Q6H  . magnesium hydroxide  30 mL Per Tube Daily  . magnesium oxide  400 mg Per Tube BID  . mouth rinse  15 mL Mouth Rinse q12n4p  . megestrol  400 mg Per Tube Daily  . mirtazapine  45 mg Per Tube QHS  . nystatin  5 mL Oral QID  . OLANZapine zydis  15 mg Sublingual QHS  . OLANZapine zydis  5 mg Oral Daily  . phosphorus  500 mg Oral TID WC  . polyethylene glycol  17 g Per Tube BID  . sennosides  5 mL Per Tube QHS  . simvastatin  10 mg Per Tube q1800  . sodium chloride flush  3 mL Intravenous Q12H  . thiamine  100 mg Oral Daily  .  vitamin C  250 mg Per Tube BID   Continuous Infusions: PRN Meds:.acetaminophen **OR** acetaminophen, clonazePAM, HYDROcodone-acetaminophen, labetalol, loperamide, milk and molasses, naphazoline-glycerin, ondansetron **OR** ondansetron (ZOFRAN) IV   Antibiotics   Anti-infectives (From admission, onward)   Start     Dose/Rate Route Frequency Ordered Stop   11/28/17 0915  ceFAZolin (ANCEF) IVPB 1 g/50 mL premix     1 g 100 mL/hr over 30 Minutes Intravenous To Radiology 11/28/17 0907 11/29/17 0915   11/28/17 0847  ceFAZolin (ANCEF) 2-4 GM/100ML-% IVPB    Note to Pharmacy:  Domenica Fail   : cabinet override      11/28/17 0847 11/28/17 1046   11/28/17 0845  ceFAZolin (ANCEF) powder 1 g  Status:  Discontinued     1 g Other To Surgery 11/28/17 0830 11/28/17 0907   10/23/17 0830  cefTRIAXone (ROCEPHIN) 1 g in sodium chloride 0.9 % 100 mL IVPB  Status:  Discontinued     1 g 200 mL/hr over 30 Minutes Intravenous Daily 10/23/17 0721 10/26/17  1400        Subjective:   Amanda Hall was seen and examined today.,  Resting, no acute issues.  Sitter at the bedside  Objective:   Vitals:   12/05/17 0407 12/05/17 0800 12/05/17 2109 12/06/17 0933  BP: 132/75 (!) 96/59 118/76 (!) 145/83  Pulse: 91 (!) 110 (!) 104 (!) 101  Resp: 18 18 (!) 22 18  Temp: 98.1 F (36.7 C)  97.6 F (36.4 C) 97.7 F (36.5 C)  TempSrc:   Oral Oral  SpO2: 100% 100% 100% 100%  Weight:      Height:        Intake/Output Summary (Last 24 hours) at 12/06/2017 1536 Last data filed at 12/06/2017 0950 Gross per 24 hour  Intake 120 ml  Output 1300 ml  Net -1180 ml     Wt Readings from Last 3 Encounters:  12/04/17 45.2 kg  08/28/17 49 kg  07/13/17 54.4 kg     Physical Exam  General: Alert and awake, anxious, cachectic, ill-appearing  Eyes:   HEENT:    Cardiovascular: S1 S2 auscultated, RRR, No pedal edema b/l  Respiratory: CTA B anteriorly  Gastrointestinal: Soft, nontender, nondistended, + bowel sounds  Ext: no pedal edema bilaterally  Neuro: does not cooperate  Musculoskeletal: No digital cyanosis, clubbing  Skin: No rashes  Psych: anxious          Data Reviewed:  I have personally reviewed following labs and imaging studies  Micro Results No results found for this or any previous visit (from the past 240 hour(s)).  Radiology Reports Ct Abdomen Wo Contrast  Result Date: 11/25/2017 CLINICAL DATA:  Evaluate anatomy for potential percutaneous gastrostomy tube placement. EXAM: CT ABDOMEN WITHOUT CONTRAST TECHNIQUE: Multidetector CT imaging of the abdomen was performed following the standard protocol without IV contrast. COMPARISON:  Acute abdominal radiographic series - 04/18/2017 FINDINGS: Lack of intravenous contrast limits the ability to evaluate solid abdominal organs. The examination is further degraded secondary to patient respiratory artifact. Lower chest: Limited visualization of the lower thorax demonstrates  minimal subsegmental atelectasis within the imaged left lower lobe. No discrete focal airspace opacities. No pleural effusion. Normal heart size.  No pericardial effusion. Hepatobiliary: Normal hepatic contour. Normal noncontrast appearance of the gallbladder given degree of distention. No ascites. Pancreas: Normal noncontrast appearance of the pancreas Spleen: Normal noncontrast appearance the spleen Adrenals/Urinary Tract: Normal noncontrast appearance of the bilateral kidneys. No renal stones. No urine obstruction or perinephric stranding.  Normal noncontrast appearance the adrenal glands. The urinary bladder was not imaged. Stomach/Bowel: The anterior wall the stomach is well apposed against the ventral wall of the abdomen though note is made of hypertrophy of the lateral segment left lobe of the liver. Enteric tube is coiled with gastric lumen. No evidence of enteric obstruction. No pneumoperitoneum, pneumatosis or portal venous gas. Vascular/Lymphatic: Atherosclerotic plaque within the abdominal aorta. No definitive bulky retroperitoneal or mesenteric adenopathy on this noncontrast examination. Other: Mild diffuse body wall anasarca. Musculoskeletal: Severe DDD of L4-L5 with disc space height loss, endplate irregularity and small posteriorly directed disc osteophyte complex at this location. IMPRESSION: 1. Gastric anatomy amenable to percutaneous gastrostomy tube placement as indicated though note, there is hypertrophy of the left lobe of the liver and sonographic evaluation to demarcate the liver edge could be performed prior to attempted percutaneous gastrostomy tube placement as deemed appropriate by the performing interventional radiologist. 2.  Aortic Atherosclerosis (ICD10-I70.0). Electronically Signed   By: Simonne Come M.D.   On: 11/25/2017 17:01   Dg Abd 1 View  Result Date: 11/28/2017 CLINICAL DATA:  Evaluate anatomy prior to potential percutaneous gastrostomy tube placement. EXAM: ABDOMEN - 1  VIEW COMPARISON:  CT abdomen pelvis-11/25/2017 FINDINGS: Enteric contrast is seen within the distal small bowel and throughout the colon. There is no significant residual enteric contrast seen within the gastric lumen. No evidence of enteric obstruction. No supine evidence of pneumoperitoneum. No pneumatosis or portal venous gas. Atherosclerotic plaque within the abdominal aorta. No definitive abnormal intra-abdominal calcifications given overlying enteric barium. Weighted tip enteric tube overlies expected location of the gastric fundus. Limited visualization of the lower thorax demonstrates left basilar/retrocardiac heterogeneous opacities, likely atelectasis. No acute osseus abnormalities. IMPRESSION: Enteric contrast seen throughout the transverse colon. No evidence of enteric obstruction Electronically Signed   By: Simonne Come M.D.   On: 11/28/2017 08:03   Ir Gastrostomy Tube Mod Sed  Result Date: 11/28/2017 INDICATION: Dysphagia, malnutrition EXAM: FLUOROSCOPIC 20 FRENCH PULL-THROUGH GASTROSTOMY Date:  11/28/2017 11/28/2017 9:16 am Radiologist:  Judie Petit. Ruel Favors, MD Guidance:  Fluoroscopic MEDICATIONS: 1 g Ancef; Antibiotics were administered within 1 hour of the procedure. Glucagon 0.5 mg IV ANESTHESIA/SEDATION: Versed 1.5 mg IV; Fentanyl 75 mcg IV Moderate Sedation Time:  16 minutes The patient was continuously monitored during the procedure by the interventional radiology nurse under my direct supervision. CONTRAST:  10 cc-administered into the gastric lumen. FLUOROSCOPY TIME:  Fluoroscopy Time: 4 minutes 30 seconds (8 mGy). COMPLICATIONS: None immediate. PROCEDURE: Informed consent was obtained from the patient following explanation of the procedure, risks, benefits and alternatives. The patient understands, agrees and consents for the procedure. All questions were addressed. A time out was performed. Maximal barrier sterile technique utilized including caps, mask, sterile gowns, sterile gloves, large  sterile drape, hand hygiene, and betadine prep. The left upper quadrant was sterilely prepped and draped. An oral gastric catheter was inserted into the stomach under fluoroscopy. The existing nasogastric feeding tube was removed. Air was injected into the stomach for insufflation and visualization under fluoroscopy. The air distended stomach was confirmed beneath the anterior abdominal wall in the frontal and lateral projections. Under sterile conditions and local anesthesia, a 17 gauge trocar needle was utilized to access the stomach percutaneously beneath the left subcostal margin. Needle position was confirmed within the stomach under biplane fluoroscopy. Contrast injection confirmed position also. A single T tack was deployed for gastropexy. Over an Amplatz guide wire, a 9-French sheath was inserted into the stomach. A  snare device was utilized to capture the oral gastric catheter. The snare device was pulled retrograde from the stomach up the esophagus and out the oropharynx. The 20-French pull-through gastrostomy was connected to the snare device and pulled antegrade through the oropharynx down the esophagus into the stomach and then through the percutaneous tract external to the patient. The gastrostomy was assembled externally. Contrast injection confirms position in the stomach. Images were obtained for documentation. The patient tolerated procedure well. No immediate complication. IMPRESSION: Fluoroscopic insertion of a 20-French "pull-through" gastrostomy. Electronically Signed   By: Judie Petit.  Shick M.D.   On: 11/28/2017 09:20    Lab Data:  CBC: Recent Labs  Lab 11/30/17 0651  WBC 8.7  HGB 10.9*  HCT 34.2*  MCV 93.4  PLT 271   Basic Metabolic Panel: Recent Labs  Lab 11/30/17 0651  NA 141  K 3.2*  CL 102  CO2 32  GLUCOSE 216*  BUN 38*  CREATININE 0.79  CALCIUM 9.3  PHOS 2.5   GFR: Estimated Creatinine Clearance: 49.4 mL/min (by C-G formula based on SCr of 0.79 mg/dL). Liver  Function Tests: Recent Labs  Lab 11/30/17 0651  ALBUMIN 2.6*   No results for input(s): LIPASE, AMYLASE in the last 168 hours. No results for input(s): AMMONIA in the last 168 hours. Coagulation Profile: No results for input(s): INR, PROTIME in the last 168 hours. Cardiac Enzymes: No results for input(s): CKTOTAL, CKMB, CKMBINDEX, TROPONINI in the last 168 hours. BNP (last 3 results) No results for input(s): PROBNP in the last 8760 hours. HbA1C: No results for input(s): HGBA1C in the last 72 hours. CBG: Recent Labs  Lab 12/05/17 0752 12/05/17 1135 12/05/17 1631 12/06/17 0746 12/06/17 1118  GLUCAP 147* 182* 181* 166* 151*   Lipid Profile: No results for input(s): CHOL, HDL, LDLCALC, TRIG, CHOLHDL, LDLDIRECT in the last 72 hours. Thyroid Function Tests: No results for input(s): TSH, T4TOTAL, FREET4, T3FREE, THYROIDAB in the last 72 hours. Anemia Panel: No results for input(s): VITAMINB12, FOLATE, FERRITIN, TIBC, IRON, RETICCTPCT in the last 72 hours. Urine analysis:    Component Value Date/Time   COLORURINE AMBER (A) 10/23/2017 0106   APPEARANCEUR CLOUDY (A) 10/23/2017 0106   LABSPEC 1.009 10/23/2017 0106   PHURINE 9.0 (H) 10/23/2017 0106   GLUCOSEU NEGATIVE 10/23/2017 0106   HGBUR MODERATE (A) 10/23/2017 0106   BILIRUBINUR NEGATIVE 10/23/2017 0106   KETONESUR NEGATIVE 10/23/2017 0106   PROTEINUR NEGATIVE 10/23/2017 0106   UROBILINOGEN 0.2 12/15/2006 1654   NITRITE NEGATIVE 10/23/2017 0106   LEUKOCYTESUR MODERATE (A) 10/23/2017 0106     Keyler Hoge M.D. [facility] 12/06/2017, 3:36 PM  Pager: [phone] Between 7am to 7pm - call Pager - [phone]  After 7pm go to [email or web address] - password [email or web address]  Call night coverage person covering after 7pm

## 2017-12-06 NOTE — Progress Notes (Signed)
Nutrition Follow-up  DOCUMENTATION CODES:   Severe malnutrition in context of social or environmental circumstances, Underweight  INTERVENTION:   Tube Feeding:  Continue bolus feedings of Osmolite 1.5 1 can (237 mL) 5 times daily   NUTRITION DIAGNOSIS:   Severe Malnutrition related to social / environmental circumstances(severe major depressive disorder, refractory depression, anxiety) as evidenced by severe fat depletion, severe muscle depletion.  Being addressed via TF   GOAL:   Patient will meet greater than or equal to 90% of their needs  Progressing  MONITOR:   PO intake, Supplement acceptance, Labs, Weight trends  REASON FOR ASSESSMENT:   Malnutrition Screening Tool    ASSESSMENT:   67 yo female admitted with AKI, dehydration, anorexia with acute metabolic encephalopathy, FTT. Pt has been refusing to eat, drink or take medications. Pt has severe protein calorie malnutrition. Pt with recent inpatient psych hospitalization 1 month ago. Pt reports difficulty swallowing although pt's sister believes this to be subjective. Pt with MDD, severe, with psych consult. PMH includes anxiety, depression, malnutrition   11/9 G-tube Placed  Remains IVC, 1:1 sitter; awaiting bed at [facility] bolus feedings of Osmolite 1 can (237 mL) 5x daily via G-tube  Continues to eat minimally by mouth; nutrition needs being met via TF  Weight relatively stable  Labs: reviewed Meds: mag ox, megace, k phos, vitamin C, thiamine   Diet Order:   Diet Order            DIET - DYS 1 Room service appropriate? Yes; Fluid consistency: Thin  Diet effective now              EDUCATION NEEDS:   Not appropriate for education at this time  Skin:  Skin Assessment: Skin Integrity Issues: Skin Integrity Issues:: Stage II, Other (Comment) Stage II: buttock Other: MASD: perineum  Last BM:  11/23  Height:   Ht Readings from Last 1 Encounters:  10/05/17 5' 2"  (1.575 m)     Weight:   Wt Readings from Last 1 Encounters:  12/04/17 45.2 kg    Ideal Body Weight:  50 kg  BMI:  Body mass index is 18.23 kg/m.  Estimated Nutritional Needs:   Kcal:  1550-1750 kcals   Protein:  72-82  Fluid:  >/= 1.7 L   Kerman Passey MS, RD, LDN, CNSC [phone] Pager  [phone] Weekend/On-Call Pager

## 2017-12-06 NOTE — Clinical Social Work Note (Signed)
CSW advised by ChiropodistAssistant Director Wandra MannanZack Brooks to re-contact [name]. Reviewed notes from previous contact with [name] on 10/3 and was informed that patient admitted to their facility in September and they cannot accept her back as she refused food and had to be sent out for nourishment.   CSW [facility] today with [name] [phone]) and they do not take persons with tubes, lines, drains, C-PAP, oxygen. CSW will [facility] to assure patient remains on their wait list.  Genelle BalVanessa Tadd Holtmeyer, MSW, LCSW Licensed Clinical Social Worker Clinical Social Work Department [facility] [phone]

## 2017-12-07 LAB — CBC
HEMATOCRIT: 31.1 % — AB (ref 36.0–46.0)
Hemoglobin: 9.9 g/dL — ABNORMAL LOW (ref 12.0–15.0)
MCH: 29.7 pg (ref 26.0–34.0)
MCHC: 31.8 g/dL (ref 30.0–36.0)
MCV: 93.4 fL (ref 80.0–100.0)
NRBC: 0 % (ref 0.0–0.2)
PLATELETS: 295 10*3/uL (ref 150–400)
RBC: 3.33 MIL/uL — ABNORMAL LOW (ref 3.87–5.11)
RDW: 13.5 % (ref 11.5–15.5)
WBC: 6.2 10*3/uL (ref 4.0–10.5)

## 2017-12-07 LAB — GLUCOSE, CAPILLARY
GLUCOSE-CAPILLARY: 142 mg/dL — AB (ref 70–99)
GLUCOSE-CAPILLARY: 92 mg/dL (ref 70–99)
Glucose-Capillary: 176 mg/dL — ABNORMAL HIGH (ref 70–99)
Glucose-Capillary: 79 mg/dL (ref 70–99)

## 2017-12-07 LAB — BASIC METABOLIC PANEL
Anion gap: 5 (ref 5–15)
BUN: 25 mg/dL — AB (ref 8–23)
CALCIUM: 9.3 mg/dL (ref 8.9–10.3)
CHLORIDE: 105 mmol/L (ref 98–111)
CO2: 31 mmol/L (ref 22–32)
CREATININE: 0.64 mg/dL (ref 0.44–1.00)
GFR calc Af Amer: >60 mL/min (ref >60)
Glucose, Bld: 80 mg/dL (ref 70–99)
Potassium: 4 mmol/L (ref 3.5–5.1)
SODIUM: 141 mmol/L (ref 135–145)

## 2017-12-07 NOTE — Progress Notes (Signed)
PROGRESS NOTE  Amanda Hall ZOX:[id number] DOB: 09/11/1950 DOA: 10/05/2017 PCP: [name], MD  HPI/Recap of past 24 hours: Amanda Hall a 67 y.o.female with a history of depression, anxiety. Patient presents secondary to confusion and agitation. She is currently awaiting inpatient psychiatry placement for her severe depression and anxiety. G tube placed 11/18.  12/07/17: seen and examined with sitter in the room. No new complaints. Awaiting placement.   Assessment/Plan: Principal Problem:   MDD (major depressive disorder), recurrent severe, without psychosis (HCC) Active Problems:   MDD (major depressive disorder)   Anorexia   Dysphagia   Severe malnutrition (HCC)   Anxiety   Pressure injury of skin  MDD (major depressive disorder), recurrent severe -Psychiatry was consulted,Continue Klonopin, Luvox, Remeron, Zyprexa, thiamine -Continue safety sitter -IVC paperwork completed, no acute issues, still needs inpatient psych facility  Active Problems:    Anorexia with severe protein calorie malnutrition -Cortrack was placed during this admission, does not have capacity, per patient's sister next of kin, G-tube was placed 11/8 -Still on dysphagia 1 diet however not eating much -Tube feeds per dietitian  Sinus tachycardia Likely due to anxiety  Acute kidney injury -Likely due to dehydration, anorexia, severe protein calorie malnutrition, resolved  Iron deficiency anemia Continue iron supplementation  Pressure injury of skin -Stage II, partial-thickness, left buttocks, not present on admission -Wound care per nursing  Code Status: Full code DVT Prophylaxis: Lovenox Family Communication: No family member at bedside   Disposition Plan: No acute issues, awaiting inpatient psych facility  Time Spent in minutes   15 minutes  Procedures:  G-tube placement on 11/19  Consultants:   Interventional radiology Psychiatry Cardiology Palliative  care   Objective: Vitals:   12/06/17 1648 12/06/17 2242 12/07/17 0557 12/07/17 0800  BP: 95/73 137/66 115/61 107/65  Pulse: 98 (!) 104 94 88  Resp: 16 16 18 18   Temp: 98.2 F (36.8 C) 98.1 F (36.7 C) 97.7 F (36.5 C) 97.6 F (36.4 C)  TempSrc: Oral Oral Oral Oral  SpO2: 99% 100% 100% 100%  Weight:      Height:        Intake/Output Summary (Last 24 hours) at 12/07/2017 1654 Last data filed at 12/07/2017 1200 Gross per 24 hour  Intake 871 ml  Output 2000 ml  Net -1129 ml   Filed Weights   12/01/17 2044 12/03/17 2100 12/04/17 0500  Weight: 43.1 kg 45.2 kg 45.2 kg    Exam:  . General: 67 y.o. year-old female frail, emaciated in no acute distress. Somnolent but easily arouses to voices. . Cardiovascular: Regular rate and rhythm with no rubs or gallops.  No thyromegaly or JVD noted.   Marland Kitchen Respiratory: Clear to auscultation with no wheezes or rales. Good inspiratory effort. . Abdomen: Soft nontender nondistended with normal bowel sounds x4 quadrants. . Musculoskeletal: No lower extremity edema. 2/4 pulses in all 4 extremities. . Skin: No ulcerative lesions noted or rashes, . Psychiatry: Mood is anxious.   Data Reviewed: CBC: Recent Labs  Lab 12/07/17 0525  WBC 6.2  HGB 9.9*  HCT 31.1*  MCV 93.4  PLT 295   Basic Metabolic Panel: Recent Labs  Lab 12/07/17 0525  NA 141  K 4.0  CL 105  CO2 31  GLUCOSE 80  BUN 25*  CREATININE 0.64  CALCIUM 9.3   GFR: Estimated Creatinine Clearance: 49.4 mL/min (by C-G formula based on SCr of 0.64 mg/dL). Liver Function Tests: No results for input(s): AST, ALT, ALKPHOS, BILITOT, PROT, ALBUMIN in  the last 168 hours. No results for input(s): LIPASE, AMYLASE in the last 168 hours. No results for input(s): AMMONIA in the last 168 hours. Coagulation Profile: No results for input(s): INR, PROTIME in the last 168 hours. Cardiac Enzymes: No results for input(s): CKTOTAL, CKMB, CKMBINDEX, TROPONINI in the last 168 hours. BNP  (last 3 results) No results for input(s): PROBNP in the last 8760 hours. HbA1C: No results for input(s): HGBA1C in the last 72 hours. CBG: Recent Labs  Lab 12/06/17 1644 12/06/17 2244 12/07/17 0803 12/07/17 1132 12/07/17 1617  GLUCAP 94 95 142* 92 176*   Lipid Profile: No results for input(s): CHOL, HDL, LDLCALC, TRIG, CHOLHDL, LDLDIRECT in the last 72 hours. Thyroid Function Tests: No results for input(s): TSH, T4TOTAL, FREET4, T3FREE, THYROIDAB in the last 72 hours. Anemia Panel: No results for input(s): VITAMINB12, FOLATE, FERRITIN, TIBC, IRON, RETICCTPCT in the last 72 hours. Urine analysis:    Component Value Date/Time   COLORURINE AMBER (A) 10/23/2017 0106   APPEARANCEUR CLOUDY (A) 10/23/2017 0106   LABSPEC 1.009 10/23/2017 0106   PHURINE 9.0 (H) 10/23/2017 0106   GLUCOSEU NEGATIVE 10/23/2017 0106   HGBUR MODERATE (A) 10/23/2017 0106   BILIRUBINUR NEGATIVE 10/23/2017 0106   KETONESUR NEGATIVE 10/23/2017 0106   PROTEINUR NEGATIVE 10/23/2017 0106   UROBILINOGEN 0.2 12/15/2006 1654   NITRITE NEGATIVE 10/23/2017 0106   LEUKOCYTESUR MODERATE (A) 10/23/2017 0106   Sepsis Labs: @LABRCNTIP (procalcitonin:4,lacticidven:4)  )No results found for this or any previous visit (from the past 240 hour(s)).    Studies: No results found.  Scheduled Meds: . chlorhexidine  15 mL Mouth Rinse BID  . enoxaparin (LOVENOX) injection  30 mg Subcutaneous Q24H  . feeding supplement (OSMOLITE 1.5 CAL)  237 mL Per Tube 5 X Daily  . ferrous sulfate  325 mg Oral Q breakfast  . fluvoxaMINE  150 mg Per Tube QHS  . free water  300 mL Per Tube Q6H  . magnesium hydroxide  30 mL Per Tube Daily  . magnesium oxide  400 mg Per Tube BID  . mouth rinse  15 mL Mouth Rinse q12n4p  . megestrol  400 mg Per Tube Daily  . mirtazapine  45 mg Per Tube QHS  . nystatin  5 mL Oral QID  . OLANZapine zydis  15 mg Sublingual QHS  . OLANZapine zydis  5 mg Oral Daily  . phosphorus  500 mg Oral TID WC  .  polyethylene glycol  17 g Per Tube BID  . sennosides  5 mL Per Tube QHS  . simvastatin  10 mg Per Tube q1800  . sodium chloride flush  3 mL Intravenous Q12H  . thiamine  100 mg Oral Daily  . vitamin C  250 mg Per Tube BID    Continuous Infusions:   LOS: 63 days     Darlin Droparole N Yves Fodor, MD [facility] Pager [phone]  If 7PM-7AM, please contact night-coverage [email or web address] Password [email or web address] 12/07/2017, 4:54 PM

## 2017-12-08 LAB — GLUCOSE, CAPILLARY
GLUCOSE-CAPILLARY: 118 mg/dL — AB (ref 70–99)
Glucose-Capillary: 89 mg/dL (ref 70–99)
Glucose-Capillary: 181 mg/dL — ABNORMAL HIGH (ref 70–99)
Glucose-Capillary: 112 mg/dL — ABNORMAL HIGH (ref 70–99)

## 2017-12-08 NOTE — Progress Notes (Signed)
PROGRESS NOTE  Coolidge BreezeDana Hall GEX:[id number] DOB: 1950/04/08 DOA: 10/05/2017 PCP: Gordy SaversKwiatkowski, Peter F, MD  HPI/Recap of past 24 hours: Amanda AmorDana Smithis a 67 y.o.female with a history of depression, anxiety. Patient presents secondary to confusion and agitation. She is currently awaiting inpatient psychiatry placement for her severe depression and anxiety. G tube placed 11/18.  12/07/17: No acute events overnight.  No new complaints.  Somnolent but arousable to voices.  Vital signs are stable.  Awaiting placement to psych hospital.  Assessment/Plan: Principal Problem:   MDD (major depressive disorder), recurrent severe, without psychosis (HCC) Active Problems:   MDD (major depressive disorder)   Anorexia   Dysphagia   Severe malnutrition (HCC)   Anxiety   Pressure injury of skin  MDD (major depressive disorder), recurrent severe -Psychiatry was consulted,Continue Klonopin, Luvox, Remeron, Zyprexa, thiamine -Continue safety sitter -IVC paperwork completed, no acute issues, still needs inpatient psych facility  Active Problems:    Persistent anorexia with severe protein calorie malnutrition -Cortrack was placed during this admission, does not have capacity, per patient's sister next of kin, G-tube was placed 11/8 -Still on dysphagia 1 diet however not eating much -Tube feeds management per dietitian  Resolved sinus tachycardia Likely due to anxiety  Acute kidney injury -Likely due to dehydration, anorexia, severe protein calorie malnutrition, resolved  Iron deficiency anemia Continue iron supplementation  Pressure injury of skin -Stage II, partial-thickness, left buttocks, not present on admission -Wound care per nursing  Code Status: Full code DVT Prophylaxis:  Subcu Lovenox daily Family Communication: No family member at bedside   Disposition Plan: No acute issues, awaiting inpatient psych facility  Time Spent in minutes   15 minutes  Procedures:    G-tube placement on 11/19  Consultants:   Interventional radiology Psychiatry Cardiology Palliative care   Objective: Vitals:   12/07/17 0800 12/07/17 1825 12/07/17 2042 12/08/17 1011  BP: 107/65 100/60 (!) 101/58 (!) 108/57  Pulse: 88 90 84 93  Resp: 18 18 18 16   Temp: 97.6 F (36.4 C) 97.7 F (36.5 C) (!) 97.5 F (36.4 C) 98.2 F (36.8 C)  TempSrc: Oral Oral Oral Oral  SpO2: 100% 100% 98% 99%  Weight:      Height:        Intake/Output Summary (Last 24 hours) at 12/08/2017 1428 Last data filed at 12/08/2017 0600 Gross per 24 hour  Intake 1194 ml  Output 1500 ml  Net -306 ml   Filed Weights   12/01/17 2044 12/03/17 2100 12/04/17 0500  Weight: 43.1 kg 45.2 kg 45.2 kg    Exam:  . General: 67 y.o. year-old female frail, emaciated, in no acute distress.  Somnolent but easily arousable to voices.   . Cardiovascular: Regular rate and rhythm with no rubs or gallops.  No JVD or thyromegaly noted.   Marland Kitchen. Respiratory: Clear to AUSCULTATION WITH NO WHEEZES OR RALES.  GOOD INSPIRATORY EFFORT. Marland Kitchen. Abdomen: Soft nontender nondistended with normal bowel sounds x4 quadrants. . Musculoskeletal: No lower extremity edema. 2/4 pulses in all 4 extremities. Marland Kitchen. Psychiatry: Mood is anxious.   Data Reviewed: CBC: Recent Labs  Lab 12/07/17 0525  WBC 6.2  HGB 9.9*  HCT 31.1*  MCV 93.4  PLT 295   Basic Metabolic Panel: Recent Labs  Lab 12/07/17 0525  NA 141  K 4.0  CL 105  CO2 31  GLUCOSE 80  BUN 25*  CREATININE 0.64  CALCIUM 9.3   GFR: Estimated Creatinine Clearance: 49.4 mL/min (by C-G formula based on SCr of  0.64 mg/dL). Liver Function Tests: No results for input(s): AST, ALT, ALKPHOS, BILITOT, PROT, ALBUMIN in the last 168 hours. No results for input(s): LIPASE, AMYLASE in the last 168 hours. No results for input(s): AMMONIA in the last 168 hours. Coagulation Profile: No results for input(s): INR, PROTIME in the last 168 hours. Cardiac Enzymes: No results for  input(s): CKTOTAL, CKMB, CKMBINDEX, TROPONINI in the last 168 hours. BNP (last 3 results) No results for input(s): PROBNP in the last 8760 hours. HbA1C: No results for input(s): HGBA1C in the last 72 hours. CBG: Recent Labs  Lab 12/07/17 1132 12/07/17 1617 12/07/17 2051 12/08/17 0744 12/08/17 1126  GLUCAP 92 176* 79 118* 89   Lipid Profile: No results for input(s): CHOL, HDL, LDLCALC, TRIG, CHOLHDL, LDLDIRECT in the last 72 hours. Thyroid Function Tests: No results for input(s): TSH, T4TOTAL, FREET4, T3FREE, THYROIDAB in the last 72 hours. Anemia Panel: No results for input(s): VITAMINB12, FOLATE, FERRITIN, TIBC, IRON, RETICCTPCT in the last 72 hours. Urine analysis:    Component Value Date/Time   COLORURINE AMBER (A) 10/23/2017 0106   APPEARANCEUR CLOUDY (A) 10/23/2017 0106   LABSPEC 1.009 10/23/2017 0106   PHURINE 9.0 (H) 10/23/2017 0106   GLUCOSEU NEGATIVE 10/23/2017 0106   HGBUR MODERATE (A) 10/23/2017 0106   BILIRUBINUR NEGATIVE 10/23/2017 0106   KETONESUR NEGATIVE 10/23/2017 0106   PROTEINUR NEGATIVE 10/23/2017 0106   UROBILINOGEN 0.2 12/15/2006 1654   NITRITE NEGATIVE 10/23/2017 0106   LEUKOCYTESUR MODERATE (A) 10/23/2017 0106   Sepsis Labs: @LABRCNTIP (procalcitonin:4,lacticidven:4)  )No results found for this or any previous visit (from the past 240 hour(s)).    Studies: No results found.  Scheduled Meds: . chlorhexidine  15 mL Mouth Rinse BID  . enoxaparin (LOVENOX) injection  30 mg Subcutaneous Q24H  . feeding supplement (OSMOLITE 1.5 CAL)  237 mL Per Tube 5 X Daily  . ferrous sulfate  325 mg Oral Q breakfast  . fluvoxaMINE  150 mg Per Tube QHS  . free water  300 mL Per Tube Q6H  . magnesium hydroxide  30 mL Per Tube Daily  . magnesium oxide  400 mg Per Tube BID  . mouth rinse  15 mL Mouth Rinse q12n4p  . megestrol  400 mg Per Tube Daily  . mirtazapine  45 mg Per Tube QHS  . nystatin  5 mL Oral QID  . OLANZapine zydis  15 mg Sublingual QHS  .  OLANZapine zydis  5 mg Oral Daily  . phosphorus  500 mg Oral TID WC  . polyethylene glycol  17 g Per Tube BID  . sennosides  5 mL Per Tube QHS  . simvastatin  10 mg Per Tube q1800  . sodium chloride flush  3 mL Intravenous Q12H  . thiamine  100 mg Oral Daily  . vitamin C  250 mg Per Tube BID    Continuous Infusions:   LOS: 64 days     Darlin Drop, MD [facility] Pager [phone]  If 7PM-7AM, please contact night-coverage [email or web address] Password [facility] 12/08/2017, 2:28 PM

## 2017-12-09 LAB — GLUCOSE, CAPILLARY
GLUCOSE-CAPILLARY: 156 mg/dL — AB (ref 70–99)
GLUCOSE-CAPILLARY: 158 mg/dL — AB (ref 70–99)
Glucose-Capillary: 127 mg/dL — ABNORMAL HIGH (ref 70–99)
Glucose-Capillary: 131 mg/dL — ABNORMAL HIGH (ref 70–99)

## 2017-12-09 NOTE — Progress Notes (Signed)
PROGRESS NOTE  Amanda Hall:[id number] DOB: 1950/09/04 DOA: 10/05/2017 PCP: [name], MD  HPI/Recap of past 24 hours: Amanda Hall a 67 y.o.female with a history of depression, anxiety. Patient presents secondary to confusion and agitation. She is currently awaiting inpatient psychiatry placement for her severe depression and anxiety. G tube placed 11/18.  12/09/2017: Patient seen and examined at bedside.  No acute events overnight.  Somnolent but appears comfortable with no sign of distress.  Awaiting placement to psych hospital.  Assessment/Plan: Principal Problem:   MDD (major depressive disorder), recurrent severe, without psychosis (HCC) Active Problems:   MDD (major depressive disorder)   Anorexia   Dysphagia   Severe malnutrition (HCC)   Anxiety   Pressure injury of skin  MDD (major depressive disorder), recurrent severe -Psychiatry was consulted,Continue Klonopin, Luvox, Remeron, Zyprexa, thiamine -Continue safety sitter -IVC paperwork completed, no acute issues, still needs inpatient psych facility  Persistent anorexia with severe protein calorie malnutrition -Cortrack was placed during this admission, does not have capacity, per patient's sister next of kin, G-tube was placed 11/8 -Still on dysphagia 1 diet however not eating much -Tube feeds management per dietitian  Resolved sinus tachycardia Noted, suspect secondary to anxiety Heart rate in the low 110 and resolving spontaneously  Acute kidney injury -Likely due to dehydration, anorexia, severe protein calorie malnutrition, resolved  Iron deficiency anemia Continue iron supplementation  Pressure injury of skin -Stage II, partial-thickness, left buttocks, not present on admission -Wound care per nursing  Code Status: Full code DVT Prophylaxis:  Subcu Lovenox daily Family Communication: No family member at bedside   Disposition Plan: No acute issues, awaiting inpatient psych  facility  Time Spent in minutes   15 minutes  Procedures:  G-tube placement on 11/19  Consultants:   Interventional radiology Psychiatry Cardiology Palliative care   Objective: Vitals:   12/08/17 2008 12/09/17 0558 12/09/17 0842 12/09/17 1636  BP: 118/66 (!) 112/54 118/61 131/77  Pulse: 97 100 96 (!) 110  Resp: 18 16 16 18   Temp: 98.2 F (36.8 C) 98.4 F (36.9 C) 98.5 F (36.9 C) 98 F (36.7 C)  TempSrc: Axillary Oral Oral Oral  SpO2: 98% 99% 100% 100%  Weight:      Height:        Intake/Output Summary (Last 24 hours) at 12/09/2017 1646 Last data filed at 12/09/2017 1541 Gross per 24 hour  Intake 1124 ml  Output 1675 ml  Net -551 ml   Filed Weights   12/01/17 2044 12/03/17 2100 12/04/17 0500  Weight: 43.1 kg 45.2 kg 45.2 kg    Exam:  . General: 67 y.o. year-old female female emaciated, in no acute distress.  Somnolent appears comfortable. . Cardiovascular: Regular rate and rhythm with no rubs or gallops.  No JVD or thyromegaly.   Marland Kitchen Respiratory: Clear to auscultation with no wheezes or rales.  Poor inspiratory effort. . Abdomen: Soft nontender nondistended with normal bowel sounds x4 quadrants.  PEG tube in place in left upper quadrant. . Musculoskeletal: No lower extremity edema. 2/4 pulses in all 4 extremities. Marland Kitchen Psychiatry: Mood is anxious.   Data Reviewed: CBC: Recent Labs  Lab 12/07/17 0525  WBC 6.2  HGB 9.9*  HCT 31.1*  MCV 93.4  PLT 295   Basic Metabolic Panel: Recent Labs  Lab 12/07/17 0525  NA 141  K 4.0  CL 105  CO2 31  GLUCOSE 80  BUN 25*  CREATININE 0.64  CALCIUM 9.3   GFR: Estimated Creatinine Clearance: 49.4  mL/min (by C-G formula based on SCr of 0.64 mg/dL). Liver Function Tests: No results for input(s): AST, ALT, ALKPHOS, BILITOT, PROT, ALBUMIN in the last 168 hours. No results for input(s): LIPASE, AMYLASE in the last 168 hours. No results for input(s): AMMONIA in the last 168 hours. Coagulation Profile: No results  for input(s): INR, PROTIME in the last 168 hours. Cardiac Enzymes: No results for input(s): CKTOTAL, CKMB, CKMBINDEX, TROPONINI in the last 168 hours. BNP (last 3 results) No results for input(s): PROBNP in the last 8760 hours. HbA1C: No results for input(s): HGBA1C in the last 72 hours. CBG: Recent Labs  Lab 12/08/17 1126 12/08/17 1655 12/08/17 2135 12/09/17 0738 12/09/17 1104  GLUCAP 89 181* 112* 156* 158*   Lipid Profile: No results for input(s): CHOL, HDL, LDLCALC, TRIG, CHOLHDL, LDLDIRECT in the last 72 hours. Thyroid Function Tests: No results for input(s): TSH, T4TOTAL, FREET4, T3FREE, THYROIDAB in the last 72 hours. Anemia Panel: No results for input(s): VITAMINB12, FOLATE, FERRITIN, TIBC, IRON, RETICCTPCT in the last 72 hours. Urine analysis:    Component Value Date/Time   COLORURINE AMBER (A) 10/23/2017 0106   APPEARANCEUR CLOUDY (A) 10/23/2017 0106   LABSPEC 1.009 10/23/2017 0106   PHURINE 9.0 (H) 10/23/2017 0106   GLUCOSEU NEGATIVE 10/23/2017 0106   HGBUR MODERATE (A) 10/23/2017 0106   BILIRUBINUR NEGATIVE 10/23/2017 0106   KETONESUR NEGATIVE 10/23/2017 0106   PROTEINUR NEGATIVE 10/23/2017 0106   UROBILINOGEN 0.2 12/15/2006 1654   NITRITE NEGATIVE 10/23/2017 0106   LEUKOCYTESUR MODERATE (A) 10/23/2017 0106   Sepsis Labs: @LABRCNTIP (procalcitonin:4,lacticidven:4)  )No results found for this or any previous visit (from the past 240 hour(s)).    Studies: No results found.  Scheduled Meds: . chlorhexidine  15 mL Mouth Rinse BID  . enoxaparin (LOVENOX) injection  30 mg Subcutaneous Q24H  . feeding supplement (OSMOLITE 1.5 CAL)  237 mL Per Tube 5 X Daily  . ferrous sulfate  325 mg Oral Q breakfast  . fluvoxaMINE  150 mg Per Tube QHS  . free water  300 mL Per Tube Q6H  . magnesium hydroxide  30 mL Per Tube Daily  . magnesium oxide  400 mg Per Tube BID  . mouth rinse  15 mL Mouth Rinse q12n4p  . megestrol  400 mg Per Tube Daily  . mirtazapine  45 mg Per  Tube QHS  . nystatin  5 mL Oral QID  . OLANZapine zydis  15 mg Sublingual QHS  . OLANZapine zydis  5 mg Oral Daily  . phosphorus  500 mg Oral TID WC  . polyethylene glycol  17 g Per Tube BID  . sennosides  5 mL Per Tube QHS  . simvastatin  10 mg Per Tube q1800  . sodium chloride flush  3 mL Intravenous Q12H  . thiamine  100 mg Oral Daily  . vitamin C  250 mg Per Tube BID    Continuous Infusions:   LOS: 65 days     Darlin Droparole N Britney Newstrom, MD [facility] Pager [phone]  If 7PM-7AM, please contact night-coverage [email or web address] Password [email or web address] 12/09/2017, 4:46 PM

## 2017-12-09 NOTE — Care Management Important Message (Signed)
Important Message  Patient Details  Name: Coolidge BreezeDana Linney MRN: [id number] Date of Birth: [birth year]   Medicare Important Message Given:  Yes. CMA tablet currently not working, therefore patient was not able to sign. CMA informed patient of Medicare rights. CMA left IM with patient.     Annalyce Lanpher 12/09/2017, 11:31 AM

## 2017-12-10 LAB — GLUCOSE, CAPILLARY
Glucose-Capillary: 82 mg/dL (ref 70–99)
Glucose-Capillary: 159 mg/dL — ABNORMAL HIGH (ref 70–99)
Glucose-Capillary: 177 mg/dL — ABNORMAL HIGH (ref 70–99)
Glucose-Capillary: 137 mg/dL — ABNORMAL HIGH (ref 70–99)

## 2017-12-10 MED ORDER — FREE WATER
350.0000 mL | Freq: Four times a day (QID) | Status: DC
  Administered 2017-12-10 – 2017-12-27 (×65): 350 mL

## 2017-12-10 NOTE — Progress Notes (Signed)
PROGRESS NOTE  Amanda Hall WUX:[id number] DOB: 1950/11/02 DOA: 10/05/2017 PCP: [name], MD  HPI/Recap of past 24 hours: Amanda Hall a 67 y.o.female with a history of depression, anxiety. Patient presents secondary to confusion and agitation. She is currently awaiting inpatient psychiatry placement for her severe depression and anxiety. G tube placed 11/18.  12/09/2017: Patient seen and examined at bedside.  No acute events overnight.  Somnolent but appears comfortable with no sign of distress.  Awaiting placement to psych hospital.  12/10/2017: Somnolent at the time of this visit.  Per staff she was up all night.  Does not appear to be in distress.  Awaiting placement to psych hospital.  Assessment/Plan: Principal Problem:   MDD (major depressive disorder), recurrent severe, without psychosis (HCC) Active Problems:   MDD (major depressive disorder)   Anorexia   Dysphagia   Severe malnutrition (HCC)   Anxiety   Pressure injury of skin  MDD (major depressive disorder), recurrent severe -Psychiatry was consulted,Continue Klonopin, Luvox, Remeron, Zyprexa, thiamine -Continue safety sitter -IVC paperwork completed, no acute issues, still needs inpatient psych facility  Dysphagia status post PEG tube placement Continue free water flushes Increased free water flushes from 300 cc every 6 hours to 350 cc every 6 hours Monitor urine output Continue PEG tube feedings Maintain head of the bed elevated above 30 degrees during feedings and for an hour after feeding  Persistent anorexia with severe protein calorie malnutrition -Cortrack was placed during this admission, does not have capacity, per patient's sister next of kin, G-tube was placed 11/8 -Still on dysphagia 1 diet however not eating much -Tube feeds management per dietitian  Resolved sinus tachycardia Noted, suspect secondary to anxiety Heart rate in the low 110 and resolving spontaneously  Acute kidney  injury -Likely due to dehydration, anorexia, severe protein calorie malnutrition, resolved  Iron deficiency anemia Continue iron supplementation  Pressure injury of skin -Stage II, partial-thickness, left buttocks, not present on admission -Wound care per nursing  Code Status: Full code DVT Prophylaxis:  Subcu Lovenox daily Family Communication: No family member at bedside   Disposition Plan: No acute issues, awaiting inpatient psych facility  Time Spent in minutes   15 minutes  Procedures:  G-tube placement on 11/19  Consultants:   Interventional radiology Psychiatry Cardiology Palliative care   Objective: Vitals:   12/09/17 2100 12/10/17 0329 12/10/17 0500 12/10/17 1047  BP: 122/66 124/62  132/73  Pulse: 96 100  (!) 117  Resp: 16 18  (!) 21  Temp: 97.7 F (36.5 C) 97.8 F (36.6 C)  98.6 F (37 C)  TempSrc: Axillary Axillary  Oral  SpO2: 100% 100%  98%  Weight:   45.3 kg   Height:        Intake/Output Summary (Last 24 hours) at 12/10/2017 1431 Last data filed at 12/10/2017 1130 Gross per 24 hour  Intake 587 ml  Output 1002 ml  Net -415 ml   Filed Weights   12/03/17 2100 12/04/17 0500 12/10/17 0500  Weight: 45.2 kg 45.2 kg 45.3 kg    Exam:  . General: 67 y.o. year-old female frail and emaciated in no acute distress.  Somnolent. . Cardiovascular: Ocular rate and rhythm with no rubs or gallops.  No JVD or thyromegaly noted.   Marland Kitchen Respiratory: Clear to auscultation with no wheezes or rales.  Poor inspiratory effort. . Abdomen: Soft nontender nondistended with normal bowel sounds x4 quadrants.  PEG tube in place in left upper quadrant. . Musculoskeletal: No lower extremity  edema. 2/4 pulses in all 4 extremities. Marland Kitchen Psychiatry: Mood is anxious.   Data Reviewed: CBC: Recent Labs  Lab 12/07/17 0525  WBC 6.2  HGB 9.9*  HCT 31.1*  MCV 93.4  PLT 295   Basic Metabolic Panel: Recent Labs  Lab 12/07/17 0525  NA 141  K 4.0  CL 105  CO2 31    GLUCOSE 80  BUN 25*  CREATININE 0.64  CALCIUM 9.3   GFR: Estimated Creatinine Clearance: 49.5 mL/min (by C-G formula based on SCr of 0.64 mg/dL). Liver Function Tests: No results for input(s): AST, ALT, ALKPHOS, BILITOT, PROT, ALBUMIN in the last 168 hours. No results for input(s): LIPASE, AMYLASE in the last 168 hours. No results for input(s): AMMONIA in the last 168 hours. Coagulation Profile: No results for input(s): INR, PROTIME in the last 168 hours. Cardiac Enzymes: No results for input(s): CKTOTAL, CKMB, CKMBINDEX, TROPONINI in the last 168 hours. BNP (last 3 results) No results for input(s): PROBNP in the last 8760 hours. HbA1C: No results for input(s): HGBA1C in the last 72 hours. CBG: Recent Labs  Lab 12/09/17 1104 12/09/17 1638 12/09/17 2058 12/10/17 0721 12/10/17 1114  GLUCAP 158* 127* 131* 82 159*   Lipid Profile: No results for input(s): CHOL, HDL, LDLCALC, TRIG, CHOLHDL, LDLDIRECT in the last 72 hours. Thyroid Function Tests: No results for input(s): TSH, T4TOTAL, FREET4, T3FREE, THYROIDAB in the last 72 hours. Anemia Panel: No results for input(s): VITAMINB12, FOLATE, FERRITIN, TIBC, IRON, RETICCTPCT in the last 72 hours. Urine analysis:    Component Value Date/Time   COLORURINE AMBER (A) 10/23/2017 0106   APPEARANCEUR CLOUDY (A) 10/23/2017 0106   LABSPEC 1.009 10/23/2017 0106   PHURINE 9.0 (H) 10/23/2017 0106   GLUCOSEU NEGATIVE 10/23/2017 0106   HGBUR MODERATE (A) 10/23/2017 0106   BILIRUBINUR NEGATIVE 10/23/2017 0106   KETONESUR NEGATIVE 10/23/2017 0106   PROTEINUR NEGATIVE 10/23/2017 0106   UROBILINOGEN 0.2 12/15/2006 1654   NITRITE NEGATIVE 10/23/2017 0106   LEUKOCYTESUR MODERATE (A) 10/23/2017 0106   Sepsis Labs: @LABRCNTIP (procalcitonin:4,lacticidven:4)  )No results found for this or any previous visit (from the past 240 hour(s)).    Studies: No results found.  Scheduled Meds: . chlorhexidine  15 mL Mouth Rinse BID  . enoxaparin  (LOVENOX) injection  30 mg Subcutaneous Q24H  . feeding supplement (OSMOLITE 1.5 CAL)  237 mL Per Tube 5 X Daily  . ferrous sulfate  325 mg Oral Q breakfast  . fluvoxaMINE  150 mg Per Tube QHS  . free water  350 mL Per Tube Q6H  . magnesium hydroxide  30 mL Per Tube Daily  . magnesium oxide  400 mg Per Tube BID  . mouth rinse  15 mL Mouth Rinse q12n4p  . megestrol  400 mg Per Tube Daily  . mirtazapine  45 mg Per Tube QHS  . nystatin  5 mL Oral QID  . OLANZapine zydis  15 mg Sublingual QHS  . OLANZapine zydis  5 mg Oral Daily  . phosphorus  500 mg Oral TID WC  . polyethylene glycol  17 g Per Tube BID  . sennosides  5 mL Per Tube QHS  . simvastatin  10 mg Per Tube q1800  . sodium chloride flush  3 mL Intravenous Q12H  . thiamine  100 mg Oral Daily  . vitamin C  250 mg Per Tube BID    Continuous Infusions:   LOS: 66 days     Darlin Drop, MD [facility] Pager [phone]  If  7PM-7AM, please contact night-coverage [email or web address] Password [email or web address] 12/10/2017, 2:31 PM

## 2017-12-10 NOTE — Social Work (Signed)
[facility].  Amanda GravesIsabel Mikella Hall, MSW, [facility] [place] Clinical Social Work [phone]

## 2017-12-11 LAB — GLUCOSE, CAPILLARY
GLUCOSE-CAPILLARY: 96 mg/dL (ref 70–99)
Glucose-Capillary: 183 mg/dL — ABNORMAL HIGH (ref 70–99)
Glucose-Capillary: 96 mg/dL (ref 70–99)
Glucose-Capillary: 103 mg/dL — ABNORMAL HIGH (ref 70–99)

## 2017-12-11 MED ORDER — OSMOLITE 1.5 CAL PO LIQD
237.0000 mL | Freq: Every day | ORAL | Status: DC
  Administered 2017-12-12 – 2017-12-27 (×74): 237 mL
  Filled 2017-12-11 (×3): qty 237
  Filled 2017-12-11 (×2): qty 1000
  Filled 2017-12-11: qty 237
  Filled 2017-12-11: qty 1000
  Filled 2017-12-11: qty 237
  Filled 2017-12-11: qty 1000
  Filled 2017-12-11 (×3): qty 237
  Filled 2017-12-11 (×2): qty 1000
  Filled 2017-12-11: qty 237
  Filled 2017-12-11 (×11): qty 1000
  Filled 2017-12-11: qty 237
  Filled 2017-12-11: qty 1000
  Filled 2017-12-11 (×2): qty 237
  Filled 2017-12-11 (×2): qty 1000
  Filled 2017-12-11: qty 237
  Filled 2017-12-11: qty 1000
  Filled 2017-12-11 (×2): qty 237
  Filled 2017-12-11 (×4): qty 1000
  Filled 2017-12-11 (×4): qty 237
  Filled 2017-12-11 (×4): qty 1000
  Filled 2017-12-11 (×2): qty 237
  Filled 2017-12-11 (×3): qty 1000
  Filled 2017-12-11: qty 237
  Filled 2017-12-11: qty 1000
  Filled 2017-12-11: qty 237
  Filled 2017-12-11: qty 1000
  Filled 2017-12-11: qty 237
  Filled 2017-12-11: qty 1000
  Filled 2017-12-11 (×2): qty 237
  Filled 2017-12-11 (×7): qty 1000
  Filled 2017-12-11 (×2): qty 237
  Filled 2017-12-11 (×7): qty 1000
  Filled 2017-12-11 (×2): qty 237
  Filled 2017-12-11 (×3): qty 1000
  Filled 2017-12-11: qty 237
  Filled 2017-12-11: qty 1000
  Filled 2017-12-11: qty 237
  Filled 2017-12-11: qty 1000
  Filled 2017-12-11: qty 237

## 2017-12-11 MED ORDER — OSMOLITE 1.5 CAL PO LIQD
237.0000 mL | Freq: Every day | ORAL | Status: AC
  Administered 2017-12-11 – 2017-12-12 (×4): 237 mL
  Filled 2017-12-11 (×6): qty 1000

## 2017-12-11 NOTE — Progress Notes (Signed)
PROGRESS NOTE  Amanda Hall ZOX:[id number] DOB: 1950-01-30 DOA: 10/05/2017 PCP: [name], MD  HPI/Recap of past 24 hours: Amanda Hall a 67 y.o.female with a history of depression, anxiety. Patient presents secondary to confusion and agitation. She is currently awaiting inpatient psychiatry placement for her severe depression and anxiety. G tube placed 11/18.  12/09/2017: Patient seen and examined at bedside.  No acute events overnight.  Somnolent but appears comfortable with no sign of distress.  Awaiting placement to psych hospital.  12/10/2017: Somnolent at the time of this visit.  Per staff she was up all night.  Does not appear to be in distress.  Awaiting placement to psych hospital.  12/11/2017: Patient seen and examined at bedside.  No acute events overnight.  Little more alert today and asked for a sip of water.  She denies any pain or dyspnea or palpitations.  Assessment/Plan: Principal Problem:   MDD (major depressive disorder), recurrent severe, without psychosis (HCC) Active Problems:   MDD (major depressive disorder)   Anorexia   Dysphagia   Severe malnutrition (HCC)   Anxiety   Pressure injury of skin  MDD (major depressive disorder), recurrent severe -Psychiatry was consulted,Continue Klonopin, Luvox, Remeron, Zyprexa, thiamine -Continue safety sitter -IVC paperwork completed, no acute issues, still needs inpatient psych facility  Dysphagia status post PEG tube placement Continue free water flushes Increased free water flushes from 300 cc every 6 hours to 350 cc every 6 hours Monitor urine output Continue PEG tube feedings Maintain head of the bed elevated above 30 degrees during feedings and for an hour after feeding  Persistent anorexia with severe protein calorie malnutrition -Cortrack was placed during this admission, does not have capacity, per patient's sister next of kin, G-tube was placed 11/8 -Still on dysphagia 1 diet however not eating  much -Tube feeds management per dietitian  Resolved sinus tachycardia Noted, suspect secondary to anxiety Heart rate in the low 110 and resolving spontaneously  Acute kidney injury -Likely due to dehydration, anorexia, severe protein calorie malnutrition, resolved  Iron deficiency anemia Continue iron supplementation  Pressure injury of skin -Stage II, partial-thickness, left buttocks, not present on admission -Wound care per nursing  Code Status: Full code DVT Prophylaxis:  Subcu Lovenox daily Family Communication: No family member at bedside   Disposition Plan: No acute issues, awaiting inpatient psych facility  Time Spent in minutes   15 minutes  Procedures:  G-tube placement on 11/19  Consultants:   Interventional radiology Psychiatry Cardiology Palliative care   Objective: Vitals:   12/10/17 2227 12/11/17 0546 12/11/17 0627 12/11/17 0700  BP: 134/74 133/68 (!) 141/67 (!) 134/56  Pulse: (!) 104 97 (!) 114 (!) 107  Resp: 18 20 18 18   Temp: 98.3 F (36.8 C) 98.1 F (36.7 C)    TempSrc: Axillary Axillary    SpO2: 99% 100% 100% 99%  Weight:      Height:        Intake/Output Summary (Last 24 hours) at 12/11/2017 1728 Last data filed at 12/11/2017 8119 Gross per 24 hour  Intake 834 ml  Output 1000 ml  Net -166 ml   Filed Weights   12/04/17 0500 12/10/17 0500 12/10/17 2037  Weight: 45.2 kg 45.3 kg 45 kg    Exam:  . General: 67 y.o. year-old female frail and emaciated in no acute distress.  Alert and oriented x3. . Cardiovascular: Regular rate and rhythm with no rubs or gallops.  No JVD noted. Marland Kitchen Respiratory: Clear to auscultation with no  wheezes or rales.  Poor history effort. . Abdomen: Soft nontender nondistended with normal bowel sounds x4 quadrants.  PEG tube in place in left upper quadrant. . Musculoskeletal: No lower extremity edema. 2/4 pulses in all 4 extremities. Marland Kitchen. Psychiatry: Mood is anxious.   Data Reviewed: CBC: Recent Labs    Lab 12/07/17 0525  WBC 6.2  HGB 9.9*  HCT 31.1*  MCV 93.4  PLT 295   Basic Metabolic Panel: Recent Labs  Lab 12/07/17 0525  NA 141  K 4.0  CL 105  CO2 31  GLUCOSE 80  BUN 25*  CREATININE 0.64  CALCIUM 9.3   GFR: Estimated Creatinine Clearance: 49.1 mL/min (by C-G formula based on SCr of 0.64 mg/dL). Liver Function Tests: No results for input(s): AST, ALT, ALKPHOS, BILITOT, PROT, ALBUMIN in the last 168 hours. No results for input(s): LIPASE, AMYLASE in the last 168 hours. No results for input(s): AMMONIA in the last 168 hours. Coagulation Profile: No results for input(s): INR, PROTIME in the last 168 hours. Cardiac Enzymes: No results for input(s): CKTOTAL, CKMB, CKMBINDEX, TROPONINI in the last 168 hours. BNP (last 3 results) No results for input(s): PROBNP in the last 8760 hours. HbA1C: No results for input(s): HGBA1C in the last 72 hours. CBG: Recent Labs  Lab 12/10/17 1616 12/10/17 2040 12/11/17 0727 12/11/17 1137 12/11/17 1640  GLUCAP 177* 137* 96 183* 96   Lipid Profile: No results for input(s): CHOL, HDL, LDLCALC, TRIG, CHOLHDL, LDLDIRECT in the last 72 hours. Thyroid Function Tests: No results for input(s): TSH, T4TOTAL, FREET4, T3FREE, THYROIDAB in the last 72 hours. Anemia Panel: No results for input(s): VITAMINB12, FOLATE, FERRITIN, TIBC, IRON, RETICCTPCT in the last 72 hours. Urine analysis:    Component Value Date/Time   COLORURINE AMBER (A) 10/23/2017 0106   APPEARANCEUR CLOUDY (A) 10/23/2017 0106   LABSPEC 1.009 10/23/2017 0106   PHURINE 9.0 (H) 10/23/2017 0106   GLUCOSEU NEGATIVE 10/23/2017 0106   HGBUR MODERATE (A) 10/23/2017 0106   BILIRUBINUR NEGATIVE 10/23/2017 0106   KETONESUR NEGATIVE 10/23/2017 0106   PROTEINUR NEGATIVE 10/23/2017 0106   UROBILINOGEN 0.2 12/15/2006 1654   NITRITE NEGATIVE 10/23/2017 0106   LEUKOCYTESUR MODERATE (A) 10/23/2017 0106   Sepsis Labs: @LABRCNTIP (procalcitonin:4,lacticidven:4)  )No results found  for this or any previous visit (from the past 240 hour(s)).    Studies: No results found.  Scheduled Meds: . chlorhexidine  15 mL Mouth Rinse BID  . enoxaparin (LOVENOX) injection  30 mg Subcutaneous Q24H  . feeding supplement (OSMOLITE 1.5 CAL)  237 mL Per Tube 5 X Daily  . ferrous sulfate  325 mg Oral Q breakfast  . fluvoxaMINE  150 mg Per Tube QHS  . free water  350 mL Per Tube Q6H  . magnesium hydroxide  30 mL Per Tube Daily  . magnesium oxide  400 mg Per Tube BID  . mouth rinse  15 mL Mouth Rinse q12n4p  . megestrol  400 mg Per Tube Daily  . mirtazapine  45 mg Per Tube QHS  . nystatin  5 mL Oral QID  . OLANZapine zydis  15 mg Sublingual QHS  . OLANZapine zydis  5 mg Oral Daily  . phosphorus  500 mg Oral TID WC  . polyethylene glycol  17 g Per Tube BID  . sennosides  5 mL Per Tube QHS  . simvastatin  10 mg Per Tube q1800  . sodium chloride flush  3 mL Intravenous Q12H  . thiamine  100 mg Oral Daily  .  vitamin C  250 mg Per Tube BID    Continuous Infusions:   LOS: 67 days     Darlin Drop, MD [facility] Pager [phone]  If 7PM-7AM, please contact night-coverage [email or web address] Password [facility] 12/11/2017, 5:28 PM

## 2017-12-12 LAB — GLUCOSE, CAPILLARY
Glucose-Capillary: 191 mg/dL — ABNORMAL HIGH (ref 70–99)
Glucose-Capillary: 132 mg/dL — ABNORMAL HIGH (ref 70–99)
Glucose-Capillary: 103 mg/dL — ABNORMAL HIGH (ref 70–99)
Glucose-Capillary: 153 mg/dL — ABNORMAL HIGH (ref 70–99)

## 2017-12-12 MED ORDER — COLLAGENASE 250 UNIT/GM EX OINT
TOPICAL_OINTMENT | Freq: Every day | CUTANEOUS | Status: AC
  Administered 2017-12-12 – 2018-01-01 (×19): via TOPICAL
  Filled 2017-12-12 (×2): qty 30

## 2017-12-12 MED ORDER — OLANZAPINE 5 MG PO TBDP
20.0000 mg | ORAL_TABLET | Freq: Every day | ORAL | Status: DC
  Administered 2017-12-12 – 2018-01-01 (×21): 20 mg via SUBLINGUAL
  Filled 2017-12-12 (×21): qty 4

## 2017-12-12 NOTE — Consult Note (Signed)
[facility] Psych Consult Progress Note  12/12/2017 12:06 PM Amanda Hall  MRN:  [id number] Subjective:   Amanda Hall was last seen by the psychiatry consult service on 11/22 for medication management. Zyprexa was increased to 20 mg daily. A PEG tube was placed on 11/8 due to poor PO intake in the setting of severe protein calorie malnutrition.   On interview, Amanda Hall reports that her anxiety level is initially a 10/10 where 10 is the worst anxiety ever and 0 is no anxiety. She is informed that she does not appear significantly anxious during her interview and she then reports that her anxiety is a 9/10. She is encouraged to stay awake during the day sot that she may sleep at night. She reports that she enjoys playing with her 66 y/o female terrier dogs. She is open to reading a book or doing puzzles. She was informed that this notewriter would look into seeing if it was possible to obtain these items for her. She denies SI, HI or AVH.   Principal Problem: MDD (major depressive disorder), recurrent severe, without psychosis (HCC) Diagnosis:   Patient Active Problem List   Diagnosis Date Noted  . Pressure injury of skin [L89.90] 11/22/2017  . Anxiety [F41.9] 10/17/2017  . Dysphagia [R13.10] 10/12/2017  . Severe malnutrition (HCC) [E43] 10/12/2017  . MDD (major depressive disorder), recurrent severe, without psychosis (HCC) [F33.2]   . Anorexia [R63.0] 10/05/2017  . Uremia [N19]   . Protein-calorie malnutrition, severe [E43] 08/29/2017  . Generalized anxiety disorder [F41.1] 05/26/2017  . MDD (major depressive disorder) [F32.9] 05/26/2017  . Hypercholesterolemia [E78.00] 03/22/2017  . Nonspecific (abnormal) findings on radiological and other examination of body structure [793] 12/26/2006  . NONSPECIFIC ABNORM FIND RAD&OTH EXAM LUNG FIELD [R93.0] 12/26/2006  . ANEMIA-IRON DEFICIENCY [D50.9] 12/15/2006  . Depression [F32.9] 12/15/2006  . Allergic rhinitis [J30.9] 12/15/2006   Total Time spent with  patient: 15 minutes  Past Psychiatric History: MDD and anxiety.   Past Medical History:  Past Medical History:  Diagnosis Date  . Allergy   . Anemia   . Anxiety   . Depression   . Hemorrhoids     Past Surgical History:  Procedure Laterality Date  . arm surgery     Left  . IR GASTROSTOMY TUBE MOD SED  11/28/2017  . TUBAL LIGATION     Family History:  Family History  Problem Relation Age of Onset  . Stroke Mother   . Hypertension Sister   . Cancer Neg Hx        breat and colon hx  . Diabetes Neg Hx        family   Family Psychiatric  History: Grandfather-depression and committed suicide.  Social History:  Social History   Substance and Sexual Activity  Alcohol Use No     Social History   Substance and Sexual Activity  Drug Use No    Social History   Socioeconomic History  . Marital status: Divorced    Spouse name: Not on file  . Number of children: Not on file  . Years of education: Not on file  . Highest education level: Not on file  Occupational History  . Not on file  Social Needs  . Financial resource strain: Not on file  . Food insecurity:    Worry: Not on file    Inability: Not on file  . Transportation needs:    Medical: Not on file    Non-medical: Not on file  Tobacco Use  .  Smoking status: Never Smoker  . Smokeless tobacco: Never Used  Substance and Sexual Activity  . Alcohol use: No  . Drug use: No  . Sexual activity: Not Currently  Lifestyle  . Physical activity:    Days per week: Not on file    Minutes per session: Not on file  . Stress: Not on file  Relationships  . Social connections:    Talks on phone: Not on file    Gets together: Not on file    Attends religious service: Not on file    Active member of club or organization: Not on file    Attends meetings of clubs or organizations: Not on file    Relationship status: Not on file  Other Topics Concern  . Not on file  Social History Narrative  . Not on file    Sleep:  Fair  Appetite:  Poor  Current Medications: Current Facility-Administered Medications  Medication Dose Route Frequency Provider Last Rate Last Dose  . acetaminophen (TYLENOL) tablet 650 mg  650 mg Oral Q6H PRN Standley BrookingGoodrich, Daniel P, MD   650 mg at 12/11/17 1554   Or  . acetaminophen (TYLENOL) suppository 650 mg  650 mg Rectal Q6H PRN Standley BrookingGoodrich, Daniel P, MD      . chlorhexidine (PERIDEX) 0.12 % solution 15 mL  15 mL Mouth Rinse BID Dow AdolphHall, Carole N, DO   15 mL at 12/12/17 0825  . clonazePAM (KLONOPIN) tablet 0.5 mg  0.5 mg Per Tube TID PRN Tyrone NineGrunz, Ryan B, MD   0.5 mg at 12/12/17 0827  . collagenase (SANTYL) ointment   Topical Daily Dow AdolphHall, Carole N, DO      . enoxaparin (LOVENOX) injection 30 mg  30 mg Subcutaneous Q24H Narda BondsNettey, Ralph A, MD   30 mg at 12/12/17 0825  . feeding supplement (OSMOLITE 1.5 CAL) liquid 237 mL  237 mL Per Tube 5 X Daily Dow AdolphHall, Carole N, DO   237 mL at 12/12/17 [id number]  . feeding supplement (OSMOLITE 1.5 CAL) liquid 237 mL  237 mL Per Tube 5 X Daily Dow AdolphHall, Carole N, DO   237 mL at 12/12/17 0825  . ferrous sulfate tablet 325 mg  325 mg Oral Q breakfast Darlin DropHall, Carole N, DO   325 mg at 12/12/17 0827  . fluvoxaMINE (LUVOX) tablet 150 mg  150 mg Per Tube QHS Blount, Xenia T, NP   150 mg at 12/11/17 2340  . free water 350 mL  350 mL Per Tube Q6H Hall, Carole N, DO   350 mL at 12/12/17 0646  . HYDROcodone-acetaminophen (NORCO/VICODIN) 5-325 MG per tablet 1-2 tablet  1-2 tablet Oral Q6H PRN Macon LargeBodenheimer, Charles A, NP   1 tablet at 12/10/17 2309  . labetalol (NORMODYNE,TRANDATE) injection 5 mg  5 mg Intravenous Q2H PRN Jerald Kiefhiu, Stephen K, MD   5 mg at 11/04/17 1133  . loperamide (IMODIUM) capsule 2 mg  2 mg Oral PRN Jerald Kiefhiu, Stephen K, MD   2 mg at 11/29/17 2232  . magnesium hydroxide (MILK OF MAGNESIA) suspension 30 mL  30 mL Per Tube Daily Blount, Xenia T, NP   30 mL at 12/12/17 0826  . magnesium oxide (MAG-OX) tablet 400 mg  400 mg Per Tube BID Regalado, Belkys A, MD   400 mg at 12/12/17 0827  .  MEDLINE mouth rinse  15 mL Mouth Rinse q12n4p Hall, Carole N, DO   15 mL at 12/11/17 1345  . megestrol (MEGACE) 400 MG/10ML suspension 400 mg  400 mg Per Tube  Daily Audrea Muscat T, NP   400 mg at 12/12/17 1610  . milk and molasses enema  1 enema Rectal Daily PRN Regalado, Belkys A, MD      . mirtazapine (REMERON SOL-TAB) disintegrating tablet 45 mg  45 mg Per Tube QHS Blount, Xenia T, NP   45 mg at 12/11/17 2339  . naphazoline-glycerin (CLEAR EYES REDNESS) ophth solution 2 drop  2 drop Both Eyes QID PRN Standley Brooking, MD      . nystatin (MYCOSTATIN) 100000 UNIT/ML suspension 500,000 Units  5 mL Oral QID Dow Adolph N, DO   500,000 Units at 12/11/17 2340  . OLANZapine zydis (ZYPREXA) disintegrating tablet 15 mg  15 mg Sublingual QHS Hall, Carole N, DO   15 mg at 12/11/17 2338  . OLANZapine zydis (ZYPREXA) disintegrating tablet 5 mg  5 mg Oral Daily Cherly Beach, DO   5 mg at 12/12/17 9604  . ondansetron (ZOFRAN) tablet 4 mg  4 mg Per Tube Q6H PRN Blount, Andi Devon T, NP       Or  . ondansetron (ZOFRAN) injection 4 mg  4 mg Intravenous Q6H PRN Audrea Muscat T, NP   4 mg at 11/15/17 0457  . phosphorus (K PHOS NEUTRAL) tablet 500 mg  500 mg Oral TID WC Tyrone Nine, MD   500 mg at 12/12/17 1133  . polyethylene glycol (MIRALAX / GLYCOLAX) packet 17 g  17 g Per Tube BID Audrea Muscat T, NP   17 g at 12/10/17 2311  . sennosides (SENOKOT) 8.8 MG/5ML syrup 5 mL  5 mL Per Tube QHS Blount, Xenia T, NP   5 mL at 12/10/17 2311  . simvastatin (ZOCOR) tablet 10 mg  10 mg Per Tube q1800 Audrea Muscat T, NP   10 mg at 12/11/17 1849  . sodium chloride flush (NS) 0.9 % injection 3 mL  3 mL Intravenous Q12H Standley Brooking, MD   3 mL at 12/12/17 0827  . thiamine (VITAMIN B-1) tablet 100 mg  100 mg Oral Daily Tyrone Nine, MD   100 mg at 12/12/17 0827  . vitamin C (ASCORBIC ACID) tablet 250 mg  250 mg Per Tube BID Tyrone Nine, MD   250 mg at 12/12/17 0827    Lab Results:  Results for orders placed  or performed during the hospital encounter of 10/05/17 (from the past 48 hour(s))  Glucose, capillary     Status: Abnormal   Collection Time: 12/10/17  4:16 PM  Result Value Ref Range   Glucose-Capillary 177 (H) 70 - 99 mg/dL  Glucose, capillary     Status: Abnormal   Collection Time: 12/10/17  8:40 PM  Result Value Ref Range   Glucose-Capillary 137 (H) 70 - 99 mg/dL  Glucose, capillary     Status: None   Collection Time: 12/11/17  7:27 AM  Result Value Ref Range   Glucose-Capillary 96 70 - 99 mg/dL  Glucose, capillary     Status: Abnormal   Collection Time: 12/11/17 11:37 AM  Result Value Ref Range   Glucose-Capillary 183 (H) 70 - 99 mg/dL  Glucose, capillary     Status: None   Collection Time: 12/11/17  4:40 PM  Result Value Ref Range   Glucose-Capillary 96 70 - 99 mg/dL  Glucose, capillary     Status: Abnormal   Collection Time: 12/11/17  9:53 PM  Result Value Ref Range   Glucose-Capillary 103 (H) 70 - 99 mg/dL  Glucose, capillary  Status: Abnormal   Collection Time: 12/12/17  7:59 AM  Result Value Ref Range   Glucose-Capillary 191 (H) 70 - 99 mg/dL  Glucose, capillary     Status: Abnormal   Collection Time: 12/12/17 11:49 AM  Result Value Ref Range   Glucose-Capillary 132 (H) 70 - 99 mg/dL    Blood Alcohol level:  Lab Results  Component Value Date   ETH <10 06/12/2017   ETH <10 05/25/2017    Musculoskeletal: Strength & Muscle Tone: Generalized weakness. Gait & Station: unable to stand Patient leans: N/A  Psychiatric Specialty Exam: Physical Exam  Nursing note and vitals reviewed. Constitutional: She is oriented to person, place, and time. She appears well-developed.  Thin   HENT:  Head: Normocephalic and atraumatic.  Neck: Normal range of motion.  Respiratory: Effort normal.  Musculoskeletal: Normal range of motion.  Neurological: She is alert and oriented to person, place, and time.  Psychiatric: Her speech is normal and behavior is normal. Judgment  and thought content normal. Her mood appears anxious (improving). Cognition and memory are impaired.    Review of Systems  Cardiovascular: Negative for chest pain.  Gastrointestinal: Negative for abdominal pain, constipation, diarrhea, nausea and vomiting.  Psychiatric/Behavioral: Negative for hallucinations, substance abuse and suicidal ideas. The patient is nervous/anxious. The patient does not have insomnia.   All other systems reviewed and are negative.   Blood pressure 117/65, pulse (!) 108, temperature 98 F (36.7 C), temperature source Oral, resp. rate 18, height 5\' 2"  (1.575 m), weight 45 kg, SpO2 100 %.Body mass index is 18.14 kg/m.  General Appearance: Fairly Groomed, elderly, cachetic, Caucasian female, wearing a hospital gown with hair in bun and Kermit in place and lying in bed. Appears drowsy and NAD.  Eye Contact:  Fair  Speech:  Clear and Coherent and Normal Rate  Volume:  Normal  Mood:  Anxious  Affect:  Constricted  Thought Process:  Goal Directed, Linear and Descriptions of Associations: Intact  Orientation:  Full (Time, Place, and Person)  Thought Content:  Logical  Suicidal Thoughts:  No  Homicidal Thoughts:  No  Memory:  Immediate;   Fair Recent;   Fair Remote;   Fair  Judgement:  Impaired  Insight:  Shallow  Psychomotor Activity:  Normal  Concentration:  Concentration: Good and Attention Span: Good  Recall:  Good  Fund of Knowledge:  Fair  Language:  Good  Akathisia:  No  Handed:  Right  AIMS (if indicated):   N/A  Assets:  Housing Social Support  ADL's:  Impaired  Cognition:  Impaired due to psychiatric condition.   Sleep:   Fair   Assessment:  Amanda Hall is a 67 y.o. female who was admitted with AKI secondary to poor PO intake. She continues to exhibit signs and symptoms of depression and anxiety including psychomotor retardation and poor appetite with significant weight loss requiring PEG tube placement.She continues to appear less anxious than she  subjectively reports. Recommend consolidating Zyprexa to nighttime dosing since patient has been sleeping in the day and up at night. She continues to warrant inpatient psychiatric hospitalization for stabilization and treatment. She may benefit from a non medication treatment modality such as ECT (if medically stable enough to undergo) since she appears to have refractory depression and anxiety.   Treatment Plan Summary: -Patient warrants inpatient psychiatric hospitalizationfor severe depression and anxiety causing significant impairments in daily functioning with poor PO intake and severe malnutrition. -Continue bedside sitter. -Consolidate Zyprexa 15 mg qhs and Zyprexa  5 mg q am to 20 mg qhs for mood stabilization and anxiety. Patient is reportedly sleeping during the day and awake at night. This change may help reverse her sleep schedule.  -May also consider Melatonin 3-6 mg qhs to regulate sleep/wake cycle.  -Continue Luvox 150 mg qhsfor depression and anxietyandRemeron 45 mg qhsfor depression and anxiety. -Continue Klonopin 0.5 mg TID PRN.  -EKG reviewed and QTc435 on 11/4. Please closely monitor when starting or increasing QTc prolonging agents. -Patient is IVC'd so she may not leave the hospital. -Willfollow patient as needed.   Cherly Beach, DO 12/12/2017, 12:06 PM

## 2017-12-12 NOTE — Consult Note (Signed)
WOC Nurse wound consult note Reason for Consult:sacral pressure injury.  Sacral region is intact, full thickness Unstageable Pressure injury noted to right medial buttock. Bilateral Heels with PrI: right is deep tissue pressure injury (DTPI) and left is a Stage 1. Wound type:Pressure Pressure Injury POA: Yes Measurement: Right medial buttock:  6cm x 3cm with 70% of wound bed obscured by the presence of thin necrotic eschar. 30% of wound bed is pink and moist. Scant serous drainage. Right heel:  DTPI measuring 3cm round. Purple/maroon discoloration.No drainage. Left heel:  3cm x 4cm area of non blanching erythema (Stage 1). No drainage. Wound bed:As described above Drainage (amount, consistency, odor) As described above Periwound:Dry, intact. Dressing procedure/placement/frequency: I will today provide bilateral pressure redistribution heel boots and have provided Nursing with guidance for the care of her heels by painting twice daily with betadine swab sticks and allowing to air-dry prior to placing into boots. The right medial buttock Unstageable PrI will be treated with an enzymatic debriding agent (collagenase/Santyl) once daily and topped with a saline moistened gauze dressing. Turning and requisitioning off of the affected area will continue.  WOC nursing team will not follow, but will remain available to this patient, the nursing and medical teams.  Please re-consult if needed. Thanks, Ladona MowLaurie Malia Corsi, MSN, RN, GNP, Hans EdenCWOCN, CWON-AP, FAAN  Pager# [phone]

## 2017-12-12 NOTE — Progress Notes (Addendum)
PROGRESS NOTE  Amanda Hall ZOX:[id number] DOB: 1950-12-08 DOA: 10/05/2017 PCP: [name], MD  HPI/Recap of past 24 hours: Amanda Hall a 67 y.o.female with a history of depression, anxiety. Patient presents secondary to confusion and agitation. She is currently awaiting inpatient psychiatry placement for her severe depression and anxiety. G tube placed 11/18.  12/09/2017: Patient seen and examined at bedside.  No acute events overnight.  Somnolent but appears comfortable with no sign of distress.  Awaiting placement to psych hospital.  12/10/2017: Somnolent at the time of this visit.  Per staff she was up all night.  Does not appear to be in distress.  Awaiting placement to psych hospital.  12/11/2017: Patient seen and examined at bedside.  No acute events overnight.  Little more alert today and asked for a sip of water.  She denies any pain or dyspnea or palpitations.  12/12/17: Seen and examined at her bedside. No acute events overnight. Somnolent. Does not appear in distress.  Assessment/Plan: Principal Problem:   MDD (major depressive disorder), recurrent severe, without psychosis (HCC) Active Problems:   MDD (major depressive disorder)   Anorexia   Dysphagia   Severe malnutrition (HCC)   Anxiety   Pressure injury of skin  MDD (major depressive disorder), recurrent severe -Psychiatry was consulted,Continue Klonopin, Luvox, Remeron, Zyprexa, thiamine -Continue safety sitter -IVC paperwork completed, no acute issues, still needs inpatient psych facility  Dysphagia status post PEG tube placement Continue free water flushes Increased free water flushes from 300 cc every 6 hours to 350 cc every 6 hours Monitor urine output Continue PEG tube feedings Maintain head of the bed elevated above 30 degrees during feedings and for an hour after feeding  Persistent anorexia with severe protein calorie malnutrition -Cortrack was placed during this admission, does not have  capacity, per patient's sister next of kin, G-tube was placed 11/8 -Still on dysphagia 1 diet however not eating much -Tube feeds management per dietitian  Resolved sinus tachycardia Noted, suspect secondary to anxiety Heart rate in the low 110 and resolving spontaneously  Acute kidney injury -Likely due to dehydration, anorexia, severe protein calorie malnutrition, resolved  Iron deficiency anemia Continue iron supplementation  Pressure injury of skin -Stage II, partial-thickness, left buttocks, not present on admission -Wound care per nursing -Wound care consult placed   Code Status: Full code DVT Prophylaxis:  Subcu Lovenox daily Family Communication: No family member at bedside   Disposition Plan: Awaiting bed at psych hospital.  Procedures:  G-tube placement on 11/19  Consultants:   Interventional radiology Psychiatry Cardiology Palliative care   Objective: Vitals:   12/11/17 0627 12/11/17 0700 12/11/17 2156 12/12/17 0619  BP: (!) 141/67 (!) 134/56 (!) 99/48 117/74  Pulse: (!) 114 (!) 107 (!) 102 94  Resp: 18 18 (!) 24 20  Temp:   97.6 F (36.4 C) 98.6 F (37 C)  TempSrc:   Oral Oral  SpO2: 100% 99% 97% 100%  Weight:      Height:        Intake/Output Summary (Last 24 hours) at 12/12/2017 0923 Last data filed at 12/12/2017 0700 Gross per 24 hour  Intake 757 ml  Output 600 ml  Net 157 ml   Filed Weights   12/04/17 0500 12/10/17 0500 12/10/17 2037  Weight: 45.2 kg 45.3 kg 45 kg    Exam:  . General: 67 y.o. year-old female frail emaciated in NAD somnolent but arouses to voices. . Cardiovascular: RRR no rubs or gallops. No JVD or thyromegaly.Marland Kitchen Marland Kitchen  Respiratory: Clear to auscultation with no wheezes or rales.  Poor history effort. . Abdomen: Soft nontender nondistended with normal bowel sounds x4 quadrants.  PEG tube in place in left upper quadrant. . Musculoskeletal: No lower extremity edema. 2/4 pulses in all 4 extremities. Marland Kitchen. Psychiatry: Mood  is anxious.   Data Reviewed: CBC: Recent Labs  Lab 12/07/17 0525  WBC 6.2  HGB 9.9*  HCT 31.1*  MCV 93.4  PLT 295   Basic Metabolic Panel: Recent Labs  Lab 12/07/17 0525  NA 141  K 4.0  CL 105  CO2 31  GLUCOSE 80  BUN 25*  CREATININE 0.64  CALCIUM 9.3   GFR: Estimated Creatinine Clearance: 49.1 mL/min (by C-G formula based on SCr of 0.64 mg/dL). Liver Function Tests: No results for input(s): AST, ALT, ALKPHOS, BILITOT, PROT, ALBUMIN in the last 168 hours. No results for input(s): LIPASE, AMYLASE in the last 168 hours. No results for input(s): AMMONIA in the last 168 hours. Coagulation Profile: No results for input(s): INR, PROTIME in the last 168 hours. Cardiac Enzymes: No results for input(s): CKTOTAL, CKMB, CKMBINDEX, TROPONINI in the last 168 hours. BNP (last 3 results) No results for input(s): PROBNP in the last 8760 hours. HbA1C: No results for input(s): HGBA1C in the last 72 hours. CBG: Recent Labs  Lab 12/11/17 0727 12/11/17 1137 12/11/17 1640 12/11/17 2153 12/12/17 0759  GLUCAP 96 183* 96 103* 191*   Lipid Profile: No results for input(s): CHOL, HDL, LDLCALC, TRIG, CHOLHDL, LDLDIRECT in the last 72 hours. Thyroid Function Tests: No results for input(s): TSH, T4TOTAL, FREET4, T3FREE, THYROIDAB in the last 72 hours. Anemia Panel: No results for input(s): VITAMINB12, FOLATE, FERRITIN, TIBC, IRON, RETICCTPCT in the last 72 hours. Urine analysis:    Component Value Date/Time   COLORURINE AMBER (A) 10/23/2017 0106   APPEARANCEUR CLOUDY (A) 10/23/2017 0106   LABSPEC 1.009 10/23/2017 0106   PHURINE 9.0 (H) 10/23/2017 0106   GLUCOSEU NEGATIVE 10/23/2017 0106   HGBUR MODERATE (A) 10/23/2017 0106   BILIRUBINUR NEGATIVE 10/23/2017 0106   KETONESUR NEGATIVE 10/23/2017 0106   PROTEINUR NEGATIVE 10/23/2017 0106   UROBILINOGEN 0.2 12/15/2006 1654   NITRITE NEGATIVE 10/23/2017 0106   LEUKOCYTESUR MODERATE (A) 10/23/2017 0106   Sepsis  Labs: @LABRCNTIP (procalcitonin:4,lacticidven:4)  )No results found for this or any previous visit (from the past 240 hour(s)).    Studies: No results found.  Scheduled Meds: . chlorhexidine  15 mL Mouth Rinse BID  . enoxaparin (LOVENOX) injection  30 mg Subcutaneous Q24H  . feeding supplement (OSMOLITE 1.5 CAL)  237 mL Per Tube 5 X Daily  . feeding supplement (OSMOLITE 1.5 CAL)  237 mL Per Tube 5 X Daily  . ferrous sulfate  325 mg Oral Q breakfast  . fluvoxaMINE  150 mg Per Tube QHS  . free water  350 mL Per Tube Q6H  . magnesium hydroxide  30 mL Per Tube Daily  . magnesium oxide  400 mg Per Tube BID  . mouth rinse  15 mL Mouth Rinse q12n4p  . megestrol  400 mg Per Tube Daily  . mirtazapine  45 mg Per Tube QHS  . nystatin  5 mL Oral QID  . OLANZapine zydis  15 mg Sublingual QHS  . OLANZapine zydis  5 mg Oral Daily  . phosphorus  500 mg Oral TID WC  . polyethylene glycol  17 g Per Tube BID  . sennosides  5 mL Per Tube QHS  . simvastatin  10 mg Per Tube q1800  .  sodium chloride flush  3 mL Intravenous Q12H  . thiamine  100 mg Oral Daily  . vitamin C  250 mg Per Tube BID    Continuous Infusions:   LOS: 68 days     Darlin Drop, MD [facility] Pager [phone]  If 7PM-7AM, please contact night-coverage [email or web address] Password [facility] 12/12/2017, 9:23 AM

## 2017-12-13 NOTE — Progress Notes (Signed)
PROGRESS NOTE  Coolidge BreezeDana Hall ZOX:[id number] DOB: May 02, 1950 DOA: 10/05/2017 PCP: Gordy SaversKwiatkowski, Peter F, MD  HPI/Recap of past 24 hours: Amanda AmorDana Smithis a 67 y.o.female with a history of depression, anxiety. Patient presents secondary to confusion and agitation. She is currently awaiting inpatient psychiatry placement for her severe depression and anxiety. G tube placed 11/18.  05/13/2017: Patient seen and examined with sitter at her bedside.  No acute events overnight.  This morning the patient is somnolent but easily arousable to voices.  No acute distress.  Awaiting placement at psych hospital.  Assessment/Plan: Principal Problem:   MDD (major depressive disorder), recurrent severe, without psychosis (HCC) Active Problems:   MDD (major depressive disorder)   Anorexia   Dysphagia   Severe malnutrition (HCC)   Anxiety   Pressure injury of skin  MDD (major depressive disorder), recurrent severe -Psychiatry was consulted,Continue Klonopin, Luvox, Remeron, Zyprexa, thiamine -Continue safety sitter -IVC paperwork completed, no acute issues, still needs inpatient psych facility  Dysphagia status post PEG tube placement Continue free water flushes Increased free water flushes from 300 cc every 6 hours to 350 cc every 6 hours Monitor urine output Continue PEG tube feedings Maintain head of the bed elevated above 30 degrees during feedings and for an hour after feeding  Persistent anorexia with severe protein calorie malnutrition -Cortrack was placed during this admission, does not have capacity, per patient's sister next of kin, G-tube was placed 11/8 -Still on dysphagia 1 diet however not eating much -Tube feeds management per dietitian  Transient hypertension and tachycardia Noted, suspect secondary to anxiety Continue to monitor vital signs  Acute kidney injury -Likely due to dehydration, anorexia, severe protein calorie malnutrition, resolved  Iron deficiency  anemia Continue iron supplementation  Pressure injury of skin -Stage II, partial-thickness, left buttocks, not present on admission -Wound care per nursing -Wound care consult placed   Code Status: Full code DVT Prophylaxis:  Subcu Lovenox daily Family Communication: No family member at bedside   Disposition Plan: Awaiting bed at psych hospital.  Procedures:  G-tube placement on 11/19  Consultants:   Interventional radiology Psychiatry Cardiology Palliative care   Objective: Vitals:   12/12/17 2014 12/12/17 2300 12/13/17 0613 12/13/17 0947  BP: (!) 120/105 (!) 126/95 116/69 (!) 153/90  Pulse: 93 95 (!) 113 (!) 120  Resp: (!) 22 20 20 18   Temp: 99 F (37.2 C)  97.9 F (36.6 C) 98.6 F (37 C)  TempSrc: Oral  Oral Oral  SpO2: 100%   99%  Weight:      Height:        Intake/Output Summary (Last 24 hours) at 12/13/2017 1342 Last data filed at 12/13/2017 1256 Gross per 24 hour  Intake 1906 ml  Output 2000 ml  Net -94 ml   Filed Weights   12/04/17 0500 12/10/17 0500 12/10/17 2037  Weight: 45.2 kg 45.3 kg 45 kg    Exam:  . General: 67 y.o. year-old female frail in NAD somnolent in no acute distress . Cardiovascular:RRR no rubs or gallops. No JVD or thyromegaly. Marland Kitchen. Respiratory: CTA no wheezes or rales. Poor inspiratory efforts. . Abdomen: Soft nontender nondistended with normal bowel sounds x4 quadrants.  PEG tube in place in left upper quadrant. . Musculoskeletal: No lower extremity edema. 2/4 pulses in all 4 extremities. Marland Kitchen. Psychiatry: Unable to assess due to somnolence.   Data Reviewed: CBC: Recent Labs  Lab 12/07/17 0525  WBC 6.2  HGB 9.9*  HCT 31.1*  MCV 93.4  PLT 295  Basic Metabolic Panel: Recent Labs  Lab 12/07/17 0525  NA 141  K 4.0  CL 105  CO2 31  GLUCOSE 80  BUN 25*  CREATININE 0.64  CALCIUM 9.3   GFR: Estimated Creatinine Clearance: 49.1 mL/min (by C-G formula based on SCr of 0.64 mg/dL). Liver Function Tests: No results  for input(s): AST, ALT, ALKPHOS, BILITOT, PROT, ALBUMIN in the last 168 hours. No results for input(s): LIPASE, AMYLASE in the last 168 hours. No results for input(s): AMMONIA in the last 168 hours. Coagulation Profile: No results for input(s): INR, PROTIME in the last 168 hours. Cardiac Enzymes: No results for input(s): CKTOTAL, CKMB, CKMBINDEX, TROPONINI in the last 168 hours. BNP (last 3 results) No results for input(s): PROBNP in the last 8760 hours. HbA1C: No results for input(s): HGBA1C in the last 72 hours. CBG: Recent Labs  Lab 12/11/17 2153 12/12/17 0759 12/12/17 1149 12/12/17 1627 12/12/17 2244  GLUCAP 103* 191* 132* 103* 153*   Lipid Profile: No results for input(s): CHOL, HDL, LDLCALC, TRIG, CHOLHDL, LDLDIRECT in the last 72 hours. Thyroid Function Tests: No results for input(s): TSH, T4TOTAL, FREET4, T3FREE, THYROIDAB in the last 72 hours. Anemia Panel: No results for input(s): VITAMINB12, FOLATE, FERRITIN, TIBC, IRON, RETICCTPCT in the last 72 hours. Urine analysis:    Component Value Date/Time   COLORURINE AMBER (A) 10/23/2017 0106   APPEARANCEUR CLOUDY (A) 10/23/2017 0106   LABSPEC 1.009 10/23/2017 0106   PHURINE 9.0 (H) 10/23/2017 0106   GLUCOSEU NEGATIVE 10/23/2017 0106   HGBUR MODERATE (A) 10/23/2017 0106   BILIRUBINUR NEGATIVE 10/23/2017 0106   KETONESUR NEGATIVE 10/23/2017 0106   PROTEINUR NEGATIVE 10/23/2017 0106   UROBILINOGEN 0.2 12/15/2006 1654   NITRITE NEGATIVE 10/23/2017 0106   LEUKOCYTESUR MODERATE (A) 10/23/2017 0106   Sepsis Labs: @LABRCNTIP (procalcitonin:4,lacticidven:4)  )No results found for this or any previous visit (from the past 240 hour(s)).    Studies: No results found.  Scheduled Meds: . chlorhexidine  15 mL Mouth Rinse BID  . collagenase   Topical Daily  . enoxaparin (LOVENOX) injection  30 mg Subcutaneous Q24H  . feeding supplement (OSMOLITE 1.5 CAL)  237 mL Per Tube 5 X Daily  . ferrous sulfate  325 mg Oral Q  breakfast  . fluvoxaMINE  150 mg Per Tube QHS  . free water  350 mL Per Tube Q6H  . magnesium hydroxide  30 mL Per Tube Daily  . magnesium oxide  400 mg Per Tube BID  . mouth rinse  15 mL Mouth Rinse q12n4p  . megestrol  400 mg Per Tube Daily  . mirtazapine  45 mg Per Tube QHS  . nystatin  5 mL Oral QID  . OLANZapine zydis  20 mg Sublingual QHS  . phosphorus  500 mg Oral TID WC  . polyethylene glycol  17 g Per Tube BID  . sennosides  5 mL Per Tube QHS  . simvastatin  10 mg Per Tube q1800  . sodium chloride flush  3 mL Intravenous Q12H  . thiamine  100 mg Oral Daily  . vitamin C  250 mg Per Tube BID    Continuous Infusions:   LOS: 69 days     Darlin Drop, MD [facility] Pager [phone]  If 7PM-7AM, please contact night-coverage [email or web address] Password [email or web address] 12/13/2017, 1:42 PM

## 2017-12-14 MED ORDER — JUVEN PO PACK
1.0000 | PACK | Freq: Two times a day (BID) | ORAL | Status: DC
  Administered 2017-12-14 – 2017-12-27 (×26): 1
  Filled 2017-12-14 (×27): qty 1

## 2017-12-14 MED ORDER — PRO-STAT SUGAR FREE PO LIQD
30.0000 mL | Freq: Every day | ORAL | Status: DC
  Administered 2017-12-14 – 2017-12-27 (×14): 30 mL
  Filled 2017-12-14 (×13): qty 30

## 2017-12-14 NOTE — Progress Notes (Signed)
PROGRESS NOTE    Amanda BreezeDana Vollmer  ZOX:[id number]:[id number] DOB: [birth year] DOA: 10/05/2017 PCP: Gordy SaversKwiatkowski, Peter F, MD     Brief Narrative:  Amanda Hall is a 67 yo female with history of depression, anxiety.  Patient was presented to the hospital secondary to confusion and agitation.  She has been seen by psychiatry who has recommended inpatient psych placement.  New events last 24 hours / Subjective: No new acute events.  She has no new physical complaints, no questions.  Assessment & Plan:   Principal Problem:   MDD (major depressive disorder), recurrent severe, without psychosis (HCC) Active Problems:   MDD (major depressive disorder)   Anorexia   Dysphagia   Severe malnutrition (HCC)   Anxiety   Pressure injury of skin   MDD (major depressive disorder), recurrent severe -Psychiatry was consulted, recommending inpatient psych placement -Continue Zyprexa 20 mg qhs for mood stabilization and anxiety. ContinueLuvox 150 mg qhsfor depression and anxietyandRemeron 45 mg qhsfor depression and anxiety. Continue Klonopin 0.5 mg TID PRN.  -Patent attorneyContinue safety sitter -IVC paperwork completed, no acute issues,still needs inpatient psych facility  Dysphagia status post PEG tube placement -Continue free water flushes, PEG tube feedings  Persistent anorexia with severe protein calorie malnutrition -Cortrack was placed during this admission, does not have capacity, per patient's sister next of kin, G-tube was placed 11/8  Transient hypertension and tachycardia -Noted, suspect secondary to anxiety -Continue to monitor vital signs.  Stable today  Acute kidney injury -Likely due to dehydration, anorexia, severe protein calorie malnutrition, resolved  Iron deficiency anemia -Continue iron supplementation  Pressure injury of skin -Stage II, partial-thickness, left buttocks, not present on admission -Wound care per nursing -Wound care consult placed   DVT prophylaxis: Lovenox Code  Status: Full code Family Communication: Family at bedside Disposition Plan: Inpatient psych placement, complicated due to PEG care   Antimicrobials:  Anti-infectives (From admission, onward)   Start     Dose/Rate Route Frequency Ordered Stop   11/28/17 0915  ceFAZolin (ANCEF) IVPB 1 g/50 mL premix     1 g 100 mL/hr over 30 Minutes Intravenous To Radiology 11/28/17 0907 11/29/17 0915   11/28/17 0847  ceFAZolin (ANCEF) 2-4 GM/100ML-% IVPB    Note to Pharmacy:  Domenica FailKlesch, [name]   : cabinet override      11/28/17 0847 11/28/17 1046   11/28/17 0845  ceFAZolin (ANCEF) powder 1 g  Status:  Discontinued     1 g Other To Surgery 11/28/17 0830 11/28/17 0907   10/23/17 0830  cefTRIAXone (ROCEPHIN) 1 g in sodium chloride 0.9 % 100 mL IVPB  Status:  Discontinued     1 g 200 mL/hr over 30 Minutes Intravenous Daily 10/23/17 0721 10/26/17 1400       Objective: Vitals:   12/13/17 1744 12/13/17 2158 12/14/17 0713 12/14/17 0915  BP: 113/67 123/79 134/72 123/76  Pulse: (!) 109 (!) 101 (!) 103 93  Resp: 18 (!) 22 16 18   Temp: 98.2 F (36.8 C) 97.7 F (36.5 C)    TempSrc: Axillary Oral    SpO2: 100% 99% 100% 100%  Weight:      Height:        Intake/Output Summary (Last 24 hours) at 12/14/2017 1644 Last data filed at 12/14/2017 1225 Gross per 24 hour  Intake 1904 ml  Output 751 ml  Net 1153 ml   Filed Weights   12/04/17 0500 12/10/17 0500 12/10/17 2037  Weight: 45.2 kg 45.3 kg 45 kg    Examination:  General exam:  Appears calm and comfortable, very thin and cachectic appearing Respiratory system: Clear to auscultation. Respiratory effort normal. Cardiovascular system: S1 & S2 heard, RRR. No JVD, murmurs, rubs, gallops or clicks. No pedal edema. Gastrointestinal system: Abdomen is nondistended, soft and nontender. No organomegaly or masses felt. Normal bowel sounds heard. Central nervous system: Alert and oriented. No focal neurological deficits. Extremities: Symmetric  Psychiatry:  Judgement and insight appear stable  Data Reviewed: I have personally reviewed following labs and imaging studies  CBC: No results for input(s): WBC, NEUTROABS, HGB, HCT, MCV, PLT in the last 168 hours. Basic Metabolic Panel: No results for input(s): NA, K, CL, CO2, GLUCOSE, BUN, CREATININE, CALCIUM, MG, PHOS in the last 168 hours. GFR: Estimated Creatinine Clearance: 49.1 mL/min (by C-G formula based on SCr of 0.64 mg/dL). Liver Function Tests: No results for input(s): AST, ALT, ALKPHOS, BILITOT, PROT, ALBUMIN in the last 168 hours. No results for input(s): LIPASE, AMYLASE in the last 168 hours. No results for input(s): AMMONIA in the last 168 hours. Coagulation Profile: No results for input(s): INR, PROTIME in the last 168 hours. Cardiac Enzymes: No results for input(s): CKTOTAL, CKMB, CKMBINDEX, TROPONINI in the last 168 hours. BNP (last 3 results) No results for input(s): PROBNP in the last 8760 hours. HbA1C: No results for input(s): HGBA1C in the last 72 hours. CBG: Recent Labs  Lab 12/11/17 2153 12/12/17 0759 12/12/17 1149 12/12/17 1627 12/12/17 2244  GLUCAP 103* 191* 132* 103* 153*   Lipid Profile: No results for input(s): CHOL, HDL, LDLCALC, TRIG, CHOLHDL, LDLDIRECT in the last 72 hours. Thyroid Function Tests: No results for input(s): TSH, T4TOTAL, FREET4, T3FREE, THYROIDAB in the last 72 hours. Anemia Panel: No results for input(s): VITAMINB12, FOLATE, FERRITIN, TIBC, IRON, RETICCTPCT in the last 72 hours. Sepsis Labs: No results for input(s): PROCALCITON, LATICACIDVEN in the last 168 hours.  No results found for this or any previous visit (from the past 240 hour(s)).     Radiology Studies: No results found.    Scheduled Meds: . chlorhexidine  15 mL Mouth Rinse BID  . collagenase   Topical Daily  . enoxaparin (LOVENOX) injection  30 mg Subcutaneous Q24H  . feeding supplement (OSMOLITE 1.5 CAL)  237 mL Per Tube 5 X Daily  . feeding supplement (PRO-STAT  SUGAR FREE 64)  30 mL Per Tube Daily  . ferrous sulfate  325 mg Oral Q breakfast  . fluvoxaMINE  150 mg Per Tube QHS  . free water  350 mL Per Tube Q6H  . magnesium hydroxide  30 mL Per Tube Daily  . magnesium oxide  400 mg Per Tube BID  . mouth rinse  15 mL Mouth Rinse q12n4p  . megestrol  400 mg Per Tube Daily  . mirtazapine  45 mg Per Tube QHS  . nutrition supplement (JUVEN)  1 packet Per Tube BID  . nystatin  5 mL Oral QID  . OLANZapine zydis  20 mg Sublingual QHS  . phosphorus  500 mg Oral TID WC  . polyethylene glycol  17 g Per Tube BID  . sennosides  5 mL Per Tube QHS  . simvastatin  10 mg Per Tube q1800  . sodium chloride flush  3 mL Intravenous Q12H  . thiamine  100 mg Oral Daily  . vitamin C  250 mg Per Tube BID   Continuous Infusions:   LOS: 70 days    Time spent: 45 minutes   Noralee Stain, DO [facility] [email or web address] Password [email or web address] 12/14/2017, 4:44 PM

## 2017-12-14 NOTE — Progress Notes (Signed)
CSW confirmed [facility]   Cristobal Goldmannadia Koreena Joost LCSW [phone]

## 2017-12-14 NOTE — Progress Notes (Signed)
Occupational Therapy Treatment Patient Details Name: Amanda Hall MRN: [id number] DOB: [birth year] Today's Date: 12/14/2017    History of present illness Pt is a 67 y/o female admitted secondary to major depressive disorder. Pt with increased confusion and agitation prior to admission. Found to have AKI and acute encephalopathy. CT negative for acute abnormality. PMH includes anxiety and depression.    OT comments  Pt did with limited participation . Pt did participated in simple grooming as well as AAROM BUE with max encouragement.  Continued to encourage pt and have pt repeat " I think I can"    Follow Up Recommendations  Other (comment)(psych hospital, 24/7 (S)) - on waiting list for Psych hospital   Equipment Recommendations  Other (comment)(defer to next venue)    Recommendations for Other Services      Precautions / Restrictions Precautions Precautions: Fall              ADL either performed or assessed with clinical judgement   ADL Overall ADL's : Needs assistance/impaired     Grooming: Wash/dry face;Moderate assistance;Bed level Grooming Details (indicate cue type and reason): used R hand more than left                               General ADL Comments: Educated pt and sitter regarding good positioning of BUE in bed ( elevated on pillows ) so shoulder in a functional position.   Pt did agree to BUE exercise - limited.  AAROM shoulder, elbow, wrist and hand.  Pt grimaced with RUE shoulder AAROM.               Cognition Arousal/Alertness: Awake/alert Behavior During Therapy: Anxious;WFL for tasks assessed/performed Overall Cognitive Status: No family/caregiver present to determine baseline cognitive functioning                                                     Pertinent Vitals/ Pain       Pain Score: 3  Pain Location: L shoulder with AROM Pain Descriptors / Indicators: Grimacing Pain Intervention(s): Limited activity  within patient's tolerance;Repositioned         Frequency  Min 2X/week        Progress Toward Goals  OT Goals(current goals can now be found in the care plan section)  Progress towards OT goals: OT to reassess next treatment     Plan Discharge plan remains appropriate       AM-PAC OT "6 Clicks" Daily Activity     Outcome Measure   Help from another person eating meals?: A Lot Help from another person taking care of personal grooming?: A Lot Help from another person toileting, which includes using toliet, bedpan, or urinal?: Total Help from another person bathing (including washing, rinsing, drying)?: Total Help from another person to put on and taking off regular upper body clothing?: Total Help from another person to put on and taking off regular lower body clothing?: Total 6 Click Score: 8    End of Session    OT Visit Diagnosis: Other abnormalities of gait and mobility (R26.89);Muscle weakness (generalized) (M62.81);Pain   Activity Tolerance Other (comment)(limited by anxiety)   Patient Left in bed;with nursing/sitter in room   Nurse Communication Other (comment)(positioning of BUE in bed on pillows )  Time: 1610-9604 OT Time Calculation (min): 14 min  Charges: OT General Charges $OT Visit: 1 Visit OT Treatments $Self Care/Home Management : 8-22 mins  Lise Auer, OT Acute Rehabilitation Services [phone] Office- [phone]      Amanda Hall, Amanda Hall 12/14/2017, 2:17 PM

## 2017-12-14 NOTE — Progress Notes (Signed)
Nutrition Follow-up  DOCUMENTATION CODES:   Severe malnutrition in context of social or environmental circumstances, Underweight  INTERVENTION:   Tube Feeding:  Continue Bolus Feedings of Osmolite 1.5 237 mL (1 can) 5 times daily Add Pro-Stat 30 mL daily Provides 1875 kcals, 90 g of protein and 905 mL of free water  Add Juven BID via G-tube, each packet provides 80 calories, 8 grams of carbohydrate, 2.5  grams of protein (collagen), 7 grams of L-arginine and 7 grams of L-glutamine; supplement contains CaHMB, Vitamins C, E, B12 and Zinc to promote wound healing   NUTRITION DIAGNOSIS:   Severe Malnutrition related to social / environmental circumstances(severe major depressive disorder, refractory depression, anxiety) as evidenced by severe fat depletion, severe muscle depletion.  Being addressed via TF   GOAL:   Patient will meet greater than or equal to 90% of their needs  Met  MONITOR:   PO intake, Supplement acceptance, Labs, Weight trends  REASON FOR ASSESSMENT:   Malnutrition Screening Tool    ASSESSMENT:   67 yo female admitted with AKI, dehydration, anorexia with acute metabolic encephalopathy, FTT. Pt has been refusing to eat, drink or take medications. Pt has severe protein calorie malnutrition. Pt with recent inpatient psych hospitalization 1 month ago. Pt reports difficulty swallowing although pt's sister believes this to be subjective. Pt with MDD, severe, with psych consult. PMH includes anxiety, depression, malnutrition  Continues to await placement; IVC with 1:1 sitter  Tolerating Osmolite 1.5 bolus feedings: 237 mL (1 can) 5 times daily Free water 350 mL q 6 hours  Noted documentation of wound by [place] RN  No weight since 11/30; weight up since admission, relatively stable recently  Labs: CBGs 96-183 Meds: reviewed   Diet Order:   Diet Order            DIET - DYS 1 Room service appropriate? Yes; Fluid consistency: Thin  Diet effective now               EDUCATION NEEDS:   Not appropriate for education at this time  Skin:  Skin Assessment: Skin Integrity Issues: Skin Integrity Issues:: Unstageable, Stage I, DTI DTI:  R. Heel Stage I: L. Heel Stage II: n/a Unstageable: buttock/sacral region Other: MASD: perineum  Last BM:  12/4  Height:   Ht Readings from Last 1 Encounters:  10/05/17 _0  (1.575 m)    Weight:   Wt Readings from Last 1 Encounters:  12/10/17 45 kg    Ideal Body Weight:  50 kg  BMI:  Body mass index is 18.14 kg/m.  Estimated Nutritional Needs:   Kcal:  1550-1750 kcals   Protein:  72-82  Fluid:  >/= 1.7 L  Kerman Passey MS, RD, LDN, CNSC [phone] Pager  [phone] Weekend/On-Call Pager

## 2017-12-15 NOTE — Progress Notes (Signed)
PROGRESS NOTE    Amanda Hall  BMW:[id number] DOB: [birth year] DOA: 10/05/2017 PCP: Gordy SaversKwiatkowski, Peter F, MD     Brief Narrative:  Amanda BreezeDana Hall is a 67 yo female with history of depression, anxiety.  Patient was presented to the hospital secondary to confusion and agitation.  She has been seen by psychiatry who has recommended inpatient psych placement.  New events last 24 hours / Subjective: No new events. Patient remains tearful.   Assessment & Plan:   Principal Problem:   MDD (major depressive disorder), recurrent severe, without psychosis (HCC) Active Problems:   MDD (major depressive disorder)   Anorexia   Dysphagia   Severe malnutrition (HCC)   Anxiety   Pressure injury of skin   MDD (major depressive disorder), recurrent severe -Psychiatry was consulted, recommending inpatient psych placement -Continue Zyprexa 20 mg qhs for mood stabilization and anxiety. ContinueLuvox 150 mg qhsfor depression and anxietyandRemeron 45 mg qhsfor depression and anxiety. Continue Klonopin 0.5 mg TID PRN.  -Patent attorneyContinue safety sitter -IVC paperwork completed, no acute issues,still needs inpatient psych facility  Dysphagia status post PEG tube placement -Continue free water flushes, PEG tube feedings  Persistent anorexia with severe protein calorie malnutrition -Cortrack was placed during this admission, does not have capacity, per patient's sister next of kin, G-tube was placed 11/8  Transient hypertension and tachycardia -Noted, suspect secondary to anxiety -Continue to monitor vital signs  Acute kidney injury -Likely due to dehydration, anorexia, severe protein calorie malnutrition, resolved  Iron deficiency anemia -Continue iron supplementation  Pressure injury of skin -Stage II, partial-thickness, left buttocks, not present on admission -Wound care per nursing -Wound care consult placed   DVT prophylaxis: Lovenox Code Status: Full code Family Communication: No family  at bedside Disposition Plan: Inpatient psych placement, complicated due to PEG care   Antimicrobials:  Anti-infectives (From admission, onward)   Start     Dose/Rate Route Frequency Ordered Stop   11/28/17 0915  ceFAZolin (ANCEF) IVPB 1 g/50 mL premix     1 g 100 mL/hr over 30 Minutes Intravenous To Radiology 11/28/17 0907 11/29/17 0915   11/28/17 0847  ceFAZolin (ANCEF) 2-4 GM/100ML-% IVPB    Note to Pharmacy:  Domenica FailKlesch, [name]   : cabinet override      11/28/17 0847 11/28/17 1046   11/28/17 0845  ceFAZolin (ANCEF) powder 1 g  Status:  Discontinued     1 g Other To Surgery 11/28/17 0830 11/28/17 0907   10/23/17 0830  cefTRIAXone (ROCEPHIN) 1 g in sodium chloride 0.9 % 100 mL IVPB  Status:  Discontinued     1 g 200 mL/hr over 30 Minutes Intravenous Daily 10/23/17 0721 10/26/17 1400       Objective: Vitals:   12/14/17 1811 12/14/17 2123 12/15/17 0500 12/15/17 1135  BP: 124/74 112/70  135/78  Pulse: (!) 103 (!) 101  (!) 114  Resp:  18    Temp: 98.2 F (36.8 C) 97.7 F (36.5 C)  98.6 F (37 C)  TempSrc: Oral Oral  Oral  SpO2: 100% 100%  100%  Weight:   44.7 kg   Height:        Intake/Output Summary (Last 24 hours) at 12/15/2017 1335 Last data filed at 12/15/2017 1100 Gross per 24 hour  Intake 1307 ml  Output 1575 ml  Net -268 ml   Filed Weights   12/10/17 0500 12/10/17 2037 12/15/17 0500  Weight: 45.3 kg 45 kg 44.7 kg    Examination: General exam: Appears tearful, anxious, shaking, very thin  and cachectic  Respiratory system: Clear to auscultation. Respiratory effort normal. Cardiovascular system: S1 & S2 heard, tachycardic, regular rhythm. No JVD, murmurs, rubs, gallops or clicks. No pedal edema. Gastrointestinal system: Abdomen is nondistended, soft and nontender. No organomegaly or masses felt. Normal bowel sounds heard. Central nervous system: Alert and oriented Extremities: Symmetric 5 x 5 power. Skin: No rashes, lesions or ulcers   Data Reviewed: I have  personally reviewed following labs and imaging studies  CBC: No results for input(s): WBC, NEUTROABS, HGB, HCT, MCV, PLT in the last 168 hours. Basic Metabolic Panel: No results for input(s): NA, K, CL, CO2, GLUCOSE, BUN, CREATININE, CALCIUM, MG, PHOS in the last 168 hours. GFR: Estimated Creatinine Clearance: 48.8 mL/min (by C-G formula based on SCr of 0.64 mg/dL). Liver Function Tests: No results for input(s): AST, ALT, ALKPHOS, BILITOT, PROT, ALBUMIN in the last 168 hours. No results for input(s): LIPASE, AMYLASE in the last 168 hours. No results for input(s): AMMONIA in the last 168 hours. Coagulation Profile: No results for input(s): INR, PROTIME in the last 168 hours. Cardiac Enzymes: No results for input(s): CKTOTAL, CKMB, CKMBINDEX, TROPONINI in the last 168 hours. BNP (last 3 results) No results for input(s): PROBNP in the last 8760 hours. HbA1C: No results for input(s): HGBA1C in the last 72 hours. CBG: Recent Labs  Lab 12/11/17 2153 12/12/17 0759 12/12/17 1149 12/12/17 1627 12/12/17 2244  GLUCAP 103* 191* 132* 103* 153*   Lipid Profile: No results for input(s): CHOL, HDL, LDLCALC, TRIG, CHOLHDL, LDLDIRECT in the last 72 hours. Thyroid Function Tests: No results for input(s): TSH, T4TOTAL, FREET4, T3FREE, THYROIDAB in the last 72 hours. Anemia Panel: No results for input(s): VITAMINB12, FOLATE, FERRITIN, TIBC, IRON, RETICCTPCT in the last 72 hours. Sepsis Labs: No results for input(s): PROCALCITON, LATICACIDVEN in the last 168 hours.  No results found for this or any previous visit (from the past 240 hour(s)).     Radiology Studies: No results found.    Scheduled Meds: . chlorhexidine  15 mL Mouth Rinse BID  . collagenase   Topical Daily  . enoxaparin (LOVENOX) injection  30 mg Subcutaneous Q24H  . feeding supplement (OSMOLITE 1.5 CAL)  237 mL Per Tube 5 X Daily  . feeding supplement (PRO-STAT SUGAR FREE 64)  30 mL Per Tube Daily  . ferrous sulfate  325  mg Oral Q breakfast  . fluvoxaMINE  150 mg Per Tube QHS  . free water  350 mL Per Tube Q6H  . magnesium oxide  400 mg Per Tube BID  . mouth rinse  15 mL Mouth Rinse q12n4p  . megestrol  400 mg Per Tube Daily  . mirtazapine  45 mg Per Tube QHS  . nutrition supplement (JUVEN)  1 packet Per Tube BID  . nystatin  5 mL Oral QID  . OLANZapine zydis  20 mg Sublingual QHS  . phosphorus  500 mg Oral TID WC  . sennosides  5 mL Per Tube QHS  . simvastatin  10 mg Per Tube q1800  . sodium chloride flush  3 mL Intravenous Q12H  . thiamine  100 mg Oral Daily  . vitamin C  250 mg Per Tube BID   Continuous Infusions:   LOS: 71 days    Time spent: 20 minutes   Noralee Stain, DO [facility] [email or web address] Password [email or web address] 12/15/2017, 1:35 PM

## 2017-12-16 LAB — CBC
HCT: 32.5 % — ABNORMAL LOW (ref 36.0–46.0)
Hemoglobin: 10.5 g/dL — ABNORMAL LOW (ref 12.0–15.0)
MCH: 30.7 pg (ref 26.0–34.0)
MCHC: 32.3 g/dL (ref 30.0–36.0)
MCV: 95 fL (ref 80.0–100.0)
Platelets: 389 10*3/uL (ref 150–400)
RBC: 3.42 MIL/uL — AB (ref 3.87–5.11)
RDW: 13.4 % (ref 11.5–15.5)
WBC: 8 10*3/uL (ref 4.0–10.5)
nRBC: 0 % (ref 0.0–0.2)

## 2017-12-16 LAB — HEPATIC FUNCTION PANEL
ALBUMIN: 2.7 g/dL — AB (ref 3.5–5.0)
ALT: 13 U/L (ref 0–44)
AST: 17 U/L (ref 15–41)
Alkaline Phosphatase: 69 U/L (ref 38–126)
Bilirubin, Direct: <0.1 mg/dL (ref 0.0–0.2)
Total Bilirubin: 0.3 mg/dL (ref 0.3–1.2)
Total Protein: 6.2 g/dL — ABNORMAL LOW (ref 6.5–8.1)

## 2017-12-16 LAB — BASIC METABOLIC PANEL
Anion gap: 11 (ref 5–15)
BUN: 40 mg/dL — ABNORMAL HIGH (ref 8–23)
CHLORIDE: 104 mmol/L (ref 98–111)
CO2: 28 mmol/L (ref 22–32)
Calcium: 9.8 mg/dL (ref 8.9–10.3)
Creatinine, Ser: 0.74 mg/dL (ref 0.44–1.00)
GFR calc Af Amer: >60 mL/min (ref >60)
GFR calc non Af Amer: >60 mL/min (ref >60)
Glucose, Bld: 163 mg/dL — ABNORMAL HIGH (ref 70–99)
POTASSIUM: 4.2 mmol/L (ref 3.5–5.1)
Sodium: 143 mmol/L (ref 135–145)

## 2017-12-16 LAB — PHOSPHORUS: Phosphorus: 2.3 mg/dL — ABNORMAL LOW (ref 2.5–4.6)

## 2017-12-16 LAB — GLUCOSE, CAPILLARY
Glucose-Capillary: 121 mg/dL — ABNORMAL HIGH (ref 70–99)
Glucose-Capillary: 146 mg/dL — ABNORMAL HIGH (ref 70–99)
Glucose-Capillary: 111 mg/dL — ABNORMAL HIGH (ref 70–99)

## 2017-12-16 LAB — MAGNESIUM: Magnesium: 2.2 mg/dL (ref 1.7–2.4)

## 2017-12-16 NOTE — Progress Notes (Signed)
PROGRESS NOTE    Amanda BreezeDana Hall  ZOX:[id number] DOB: [birth year] DOA: 10/05/2017 PCP: Gordy SaversKwiatkowski, Peter F, MD     Brief Narrative:  Amanda Hall is a 67 yo female with history of depression, anxiety.  Patient was presented to the hospital secondary to confusion and agitation.  She has been seen by psychiatry who has recommended inpatient psych placement.  New events last 24 hours / Subjective: Somnolent this morning, sleeping comfortably   Assessment & Plan:   Principal Problem:   MDD (major depressive disorder), recurrent severe, without psychosis (HCC) Active Problems:   MDD (major depressive disorder)   Anorexia   Dysphagia   Severe malnutrition (HCC)   Anxiety   Pressure injury of skin   MDD (major depressive disorder), recurrent severe -Psychiatry was consulted, recommending inpatient psych placement -Continue Zyprexa 20 mg qhs for mood stabilization and anxiety. ContinueLuvox 150 mg qhsfor depression and anxietyandRemeron 45 mg qhsfor depression and anxiety. Continue Klonopin 0.5 mg TID PRN.  -Patent attorneyContinue safety sitter -IVC paperwork completed, no acute issues,still needs inpatient psych facility  Dysphagia status post PEG tube placement -Continue free water flushes, PEG tube feedings  Persistent anorexia with severe protein calorie malnutrition -Cortrack was placed during this admission, does not have capacity, per patient's sister next of kin, G-tube was placed 11/8  Transient hypertension and tachycardia -Noted, suspect secondary to anxiety -Continue to monitor vital signs  Acute kidney injury -Likely due to dehydration, anorexia, severe protein calorie malnutrition, resolved  Iron deficiency anemia -Continue iron supplementation  Pressure injury of skin -Stage II, partial-thickness, left buttocks, not present on admission -Wound care per nursing -Wound care consult placed   DVT prophylaxis: Lovenox Code Status: Full code Family Communication: No  family at bedside Disposition Plan: Inpatient psych placement, complicated due to PEG care   Antimicrobials:  Anti-infectives (From admission, onward)   Start     Dose/Rate Route Frequency Ordered Stop   11/28/17 0915  ceFAZolin (ANCEF) IVPB 1 g/50 mL premix     1 g 100 mL/hr over 30 Minutes Intravenous To Radiology 11/28/17 0907 11/29/17 0915   11/28/17 0847  ceFAZolin (ANCEF) 2-4 GM/100ML-% IVPB    Note to Pharmacy:  Domenica FailKlesch, [name]   : cabinet override      11/28/17 0847 11/28/17 1046   11/28/17 0845  ceFAZolin (ANCEF) powder 1 g  Status:  Discontinued     1 g Other To Surgery 11/28/17 0830 11/28/17 0907   10/23/17 0830  cefTRIAXone (ROCEPHIN) 1 g in sodium chloride 0.9 % 100 mL IVPB  Status:  Discontinued     1 g 200 mL/hr over 30 Minutes Intravenous Daily 10/23/17 0721 10/26/17 1400       Objective: Vitals:   12/15/17 1135 12/15/17 2132 12/16/17 0500 12/16/17 0704  BP: 135/78 139/84  (!) 161/136  Pulse: (!) 114 (!) 105  (!) 122  Resp:  16  20  Temp: 98.6 F (37 C) 98.2 F (36.8 C)  98.1 F (36.7 C)  TempSrc: Oral Oral  Oral  SpO2: 100% 100%  100%  Weight:   44.7 kg   Height:        Intake/Output Summary (Last 24 hours) at 12/16/2017 1106 Last data filed at 12/16/2017 0700 Gross per 24 hour  Intake 1400 ml  Output 1500 ml  Net -100 ml   Filed Weights   12/10/17 2037 12/15/17 0500 12/16/17 0500  Weight: 45 kg 44.7 kg 44.7 kg    Examination: General exam: Appears calm and comfortable, sleeping, cachectic  Respiratory system: Clear to auscultation. Respiratory effort normal. Cardiovascular system: S1 & S2 heard, RRR. No JVD, murmurs, rubs, gallops or clicks. No pedal edema. Gastrointestinal system: Abdomen is nondistended, soft and nontender. No organomegaly or masses felt. Normal bowel sounds heard. Extremities: Symmetric  Skin: No rashes, lesions or ulcers   Data Reviewed: I have personally reviewed following labs and imaging studies  CBC: No results for  input(s): WBC, NEUTROABS, HGB, HCT, MCV, PLT in the last 168 hours. Basic Metabolic Panel: No results for input(s): NA, K, CL, CO2, GLUCOSE, BUN, CREATININE, CALCIUM, MG, PHOS in the last 168 hours. GFR: Estimated Creatinine Clearance: 48.8 mL/min (by C-G formula based on SCr of 0.64 mg/dL). Liver Function Tests: No results for input(s): AST, ALT, ALKPHOS, BILITOT, PROT, ALBUMIN in the last 168 hours. No results for input(s): LIPASE, AMYLASE in the last 168 hours. No results for input(s): AMMONIA in the last 168 hours. Coagulation Profile: No results for input(s): INR, PROTIME in the last 168 hours. Cardiac Enzymes: No results for input(s): CKTOTAL, CKMB, CKMBINDEX, TROPONINI in the last 168 hours. BNP (last 3 results) No results for input(s): PROBNP in the last 8760 hours. HbA1C: No results for input(s): HGBA1C in the last 72 hours. CBG: Recent Labs  Lab 12/12/17 1149 12/12/17 1627 12/12/17 2244 12/16/17 0149 12/16/17 0749  GLUCAP 132* 103* 153* 121* 146*   Lipid Profile: No results for input(s): CHOL, HDL, LDLCALC, TRIG, CHOLHDL, LDLDIRECT in the last 72 hours. Thyroid Function Tests: No results for input(s): TSH, T4TOTAL, FREET4, T3FREE, THYROIDAB in the last 72 hours. Anemia Panel: No results for input(s): VITAMINB12, FOLATE, FERRITIN, TIBC, IRON, RETICCTPCT in the last 72 hours. Sepsis Labs: No results for input(s): PROCALCITON, LATICACIDVEN in the last 168 hours.  No results found for this or any previous visit (from the past 240 hour(s)).     Radiology Studies: No results found.    Scheduled Meds: . chlorhexidine  15 mL Mouth Rinse BID  . collagenase   Topical Daily  . enoxaparin (LOVENOX) injection  30 mg Subcutaneous Q24H  . feeding supplement (OSMOLITE 1.5 CAL)  237 mL Per Tube 5 X Daily  . feeding supplement (PRO-STAT SUGAR FREE 64)  30 mL Per Tube Daily  . ferrous sulfate  325 mg Oral Q breakfast  . fluvoxaMINE  150 mg Per Tube QHS  . free water  350  mL Per Tube Q6H  . magnesium oxide  400 mg Per Tube BID  . mouth rinse  15 mL Mouth Rinse q12n4p  . megestrol  400 mg Per Tube Daily  . mirtazapine  45 mg Per Tube QHS  . nutrition supplement (JUVEN)  1 packet Per Tube BID  . nystatin  5 mL Oral QID  . OLANZapine zydis  20 mg Sublingual QHS  . phosphorus  500 mg Oral TID WC  . sennosides  5 mL Per Tube QHS  . simvastatin  10 mg Per Tube q1800  . sodium chloride flush  3 mL Intravenous Q12H  . thiamine  100 mg Oral Daily  . vitamin C  250 mg Per Tube BID   Continuous Infusions:   LOS: 72 days    Time spent: 20 minutes   Noralee Stain, DO [facility] [email or web address] Password [email or web address] 12/16/2017, 11:06 AM

## 2017-12-17 NOTE — Progress Notes (Addendum)
12/17/2017:   CSW spoke with [facility] is still waitlisted.   Drucilla Schmidtaitlin Sereniti Wan, MSW, LCSW-A Clinical Social Worker [facility]

## 2017-12-17 NOTE — Progress Notes (Signed)
PROGRESS NOTE    Amanda BreezeDana Hall  ZOX:[id number]:[id number] DOB: 1950/09/01 DOA: 10/05/2017 PCP: Gordy SaversKwiatkowski, Peter F, MD     Brief Narrative:  Amanda Hall is a 67 yo female with history of depression, anxiety.  Patient was presented to the hospital secondary to confusion and agitation.  She has been seen by psychiatry who has recommended inpatient psych placement.  New events last 24 hours / Subjective: She is upset that I startled her awake  Assessment & Plan:   Principal Problem:   MDD (major depressive disorder), recurrent severe, without psychosis (HCC) Active Problems:   MDD (major depressive disorder)   Anorexia   Dysphagia   Severe malnutrition (HCC)   Anxiety   Pressure injury of skin   MDD (major depressive disorder), recurrent severe -Psychiatry was consulted, recommending inpatient psych placement -Continue Zyprexa 20 mg qhs for mood stabilization and anxiety. ContinueLuvox 150 mg qhsfor depression and anxietyandRemeron 45 mg qhsfor depression and anxiety. Continue Klonopin 0.5 mg TID PRN.  -Patent attorneyContinue safety sitter -IVC paperwork completed, no acute issues,still needs inpatient psych facility  Dysphagia status post PEG tube placement -Continue free water flushes, PEG tube feedings  Persistent anorexia with severe protein calorie malnutrition -Cortrack was placed during this admission, does not have capacity, per patient's sister next of kin, G-tube was placed 11/8  Transient hypertension and tachycardia -Noted, suspect secondary to anxiety -Continue to monitor vital signs  Acute kidney injury -Likely due to dehydration, anorexia, severe protein calorie malnutrition, resolved  Iron deficiency anemia -Continue iron supplementation  Pressure injury of skin -Stage II, partial-thickness, left buttocks, not present on admission -Wound care per nursing  DVT prophylaxis: Lovenox Code Status: Full code Family Communication: No family at bedside Disposition Plan:  Inpatient psych placement, complicated due to PEG care   Antimicrobials:  Anti-infectives (From admission, onward)   Start     Dose/Rate Route Frequency Ordered Stop   11/28/17 0915  ceFAZolin (ANCEF) IVPB 1 g/50 mL premix     1 g 100 mL/hr over 30 Minutes Intravenous To Radiology 11/28/17 0907 11/29/17 0915   11/28/17 0847  ceFAZolin (ANCEF) 2-4 GM/100ML-% IVPB    Note to Pharmacy:  Domenica FailKlesch, [name]   : cabinet override      11/28/17 0847 11/28/17 1046   11/28/17 0845  ceFAZolin (ANCEF) powder 1 g  Status:  Discontinued     1 g Other To Surgery 11/28/17 0830 11/28/17 0907   10/23/17 0830  cefTRIAXone (ROCEPHIN) 1 g in sodium chloride 0.9 % 100 mL IVPB  Status:  Discontinued     1 g 200 mL/hr over 30 Minutes Intravenous Daily 10/23/17 0721 10/26/17 1400       Objective: Vitals:   12/16/17 1548 12/16/17 1807 12/16/17 2211 12/17/17 0820  BP: 99/69 (!) 136/97 121/71 130/74  Pulse: (!) 117 (!) 115 (!) 106 100  Resp: (!) 22  16 18   Temp: 98.2 F (36.8 C) (!) 97.5 F (36.4 C) 98.7 F (37.1 C) 98.1 F (36.7 C)  TempSrc: Axillary Oral  Oral  SpO2: 100% 99% 100% 100%  Weight:      Height:        Intake/Output Summary (Last 24 hours) at 12/17/2017 1517 Last data filed at 12/17/2017 1200 Gross per 24 hour  Intake 1687 ml  Output 900 ml  Net 787 ml   Filed Weights   12/10/17 2037 12/15/17 0500 12/16/17 0500  Weight: 45 kg 44.7 kg 44.7 kg    Examination: General exam: Appears sleeping comfortably, anxious once  woken up  Respiratory system: Clear to auscultation. Respiratory effort normal. Cardiovascular system: S1 & S2 heard, tachycardic, regular. No JVD, murmurs, rubs, gallops or clicks. No pedal edema. Gastrointestinal system: Abdomen is nondistended, soft and nontender. No organomegaly or masses felt. Normal bowel sounds heard. Extremities: Symmetric  Skin: +stage 2 pressure wound buttock without acute infection or pus drainage    Data Reviewed: I have personally reviewed  following labs and imaging studies  CBC: Recent Labs  Lab 12/16/17 1155  WBC 8.0  HGB 10.5*  HCT 32.5*  MCV 95.0  PLT 389   Basic Metabolic Panel: Recent Labs  Lab 12/16/17 1155  NA 143  K 4.2  CL 104  CO2 28  GLUCOSE 163*  BUN 40*  CREATININE 0.74  CALCIUM 9.8  MG 2.2  PHOS 2.3*   GFR: Estimated Creatinine Clearance: 48.8 mL/min (by C-G formula based on SCr of 0.74 mg/dL). Liver Function Tests: Recent Labs  Lab 12/16/17 1155  AST 17  ALT 13  ALKPHOS 69  BILITOT 0.3  PROT 6.2*  ALBUMIN 2.7*   No results for input(s): LIPASE, AMYLASE in the last 168 hours. No results for input(s): AMMONIA in the last 168 hours. Coagulation Profile: No results for input(s): INR, PROTIME in the last 168 hours. Cardiac Enzymes: No results for input(s): CKTOTAL, CKMB, CKMBINDEX, TROPONINI in the last 168 hours. BNP (last 3 results) No results for input(s): PROBNP in the last 8760 hours. HbA1C: No results for input(s): HGBA1C in the last 72 hours. CBG: Recent Labs  Lab 12/12/17 1627 12/12/17 2244 12/16/17 0149 12/16/17 0749 12/16/17 1108  GLUCAP 103* 153* 121* 146* 111*   Lipid Profile: No results for input(s): CHOL, HDL, LDLCALC, TRIG, CHOLHDL, LDLDIRECT in the last 72 hours. Thyroid Function Tests: No results for input(s): TSH, T4TOTAL, FREET4, T3FREE, THYROIDAB in the last 72 hours. Anemia Panel: No results for input(s): VITAMINB12, FOLATE, FERRITIN, TIBC, IRON, RETICCTPCT in the last 72 hours. Sepsis Labs: No results for input(s): PROCALCITON, LATICACIDVEN in the last 168 hours.  No results found for this or any previous visit (from the past 240 hour(s)).     Radiology Studies: No results found.    Scheduled Meds: . chlorhexidine  15 mL Mouth Rinse BID  . collagenase   Topical Daily  . enoxaparin (LOVENOX) injection  30 mg Subcutaneous Q24H  . feeding supplement (OSMOLITE 1.5 CAL)  237 mL Per Tube 5 X Daily  . feeding supplement (PRO-STAT SUGAR FREE 64)   30 mL Per Tube Daily  . ferrous sulfate  325 mg Oral Q breakfast  . fluvoxaMINE  150 mg Per Tube QHS  . free water  350 mL Per Tube Q6H  . magnesium oxide  400 mg Per Tube BID  . mouth rinse  15 mL Mouth Rinse q12n4p  . megestrol  400 mg Per Tube Daily  . mirtazapine  45 mg Per Tube QHS  . nutrition supplement (JUVEN)  1 packet Per Tube BID  . nystatin  5 mL Oral QID  . OLANZapine zydis  20 mg Sublingual QHS  . phosphorus  500 mg Oral TID WC  . sennosides  5 mL Per Tube QHS  . simvastatin  10 mg Per Tube q1800  . sodium chloride flush  3 mL Intravenous Q12H  . thiamine  100 mg Oral Daily  . vitamin C  250 mg Per Tube BID   Continuous Infusions:   LOS: 73 days    Time spent: 20 minutes  Noralee Stain, DO [facility] [email or web address] Password [email or web address] 12/17/2017, 3:17 PM

## 2017-12-18 NOTE — Progress Notes (Addendum)
PROGRESS NOTE    Amanda Hall  VWU:[id number] DOB: 21-Apr-1950 DOA: 10/05/2017 PCP: Gordy SaversKwiatkowski, Peter F, MD     Brief Narrative:  Amanda BreezeDana Hall is a 67 yo female with history of depression, anxiety.  Patient was presented to the hospital secondary to confusion and agitation.  She has been seen by psychiatry who has recommended inpatient psych placement.  New events last 24 hours / Subjective: States that she is not doing well, unable to elaborate.  Assessment & Plan:   Principal Problem:   MDD (major depressive disorder), recurrent severe, without psychosis (HCC) Active Problems:   MDD (major depressive disorder)   Anorexia   Dysphagia   Severe malnutrition (HCC)   Anxiety   Pressure injury of skin   MDD (major depressive disorder), recurrent severe -Psychiatry was consulted, recommending inpatient psych placement -Continue Zyprexa 20 mg qhs for mood stabilization and anxiety. ContinueLuvox 150 mg qhsfor depression and anxietyandRemeron 45 mg qhsfor depression and anxiety. Continue Klonopin 0.5 mg TID PRN.  -Patent attorneyContinue safety sitter -IVC paperwork completed, no acute issues,still needs inpatient psych facility  Dysphagia status post PEG tube placement -Continue free water flushes, PEG tube feedings  Persistent anorexia with severe protein calorie malnutrition -Cortrack was placed during this admission, does not have capacity, per patient's sister next of kin, G-tube was placed 11/8  Transient hypertension and tachycardia -Noted, suspect secondary to anxiety -Continue to monitor vital signs  Acute kidney injury -Likely due to dehydration, anorexia, severe protein calorie malnutrition, resolved  Iron deficiency anemia -Continue iron supplementation  Pressure injury of skin -Stage II, partial-thickness, left buttocks, not present on admission -Wound care per nursing -Does not appear to be acutely infected  DVT prophylaxis: Lovenox Code Status: Full code Family  Communication: No family at bedside Disposition Plan: Inpatient psych placement, complicated due to PEG care   Antimicrobials:  Anti-infectives (From admission, onward)   Start     Dose/Rate Route Frequency Ordered Stop   11/28/17 0915  ceFAZolin (ANCEF) IVPB 1 g/50 mL premix     1 g 100 mL/hr over 30 Minutes Intravenous To Radiology 11/28/17 0907 11/29/17 0915   11/28/17 0847  ceFAZolin (ANCEF) 2-4 GM/100ML-% IVPB    Note to Pharmacy:  Domenica FailKlesch, [name]   : cabinet override      11/28/17 0847 11/28/17 1046   11/28/17 0845  ceFAZolin (ANCEF) powder 1 g  Status:  Discontinued     1 g Other To Surgery 11/28/17 0830 11/28/17 0907   10/23/17 0830  cefTRIAXone (ROCEPHIN) 1 g in sodium chloride 0.9 % 100 mL IVPB  Status:  Discontinued     1 g 200 mL/hr over 30 Minutes Intravenous Daily 10/23/17 0721 10/26/17 1400       Objective: Vitals:   12/17/17 1732 12/18/17 0300 12/18/17 0445 12/18/17 0902  BP: 128/76  129/78 130/70  Pulse: 99  98 88  Resp:   17 18  Temp: 98.2 F (36.8 C)  98.3 F (36.8 C)   TempSrc: Oral  Oral   SpO2: 100%  100% 100%  Weight:  46.2 kg    Height:        Intake/Output Summary (Last 24 hours) at 12/18/2017 1233 Last data filed at 12/18/2017 0952 Gross per 24 hour  Intake 1061 ml  Output 0 ml  Net 1061 ml   Filed Weights   12/15/17 0500 12/16/17 0500 12/18/17 0300  Weight: 44.7 kg 44.7 kg 46.2 kg    Examination: General exam: Appears anxious Declined further physical examination today  Data Reviewed: I have personally reviewed following labs and imaging studies  CBC: Recent Labs  Lab 12/16/17 1155  WBC 8.0  HGB 10.5*  HCT 32.5*  MCV 95.0  PLT 389   Basic Metabolic Panel: Recent Labs  Lab 12/16/17 1155  NA 143  K 4.2  CL 104  CO2 28  GLUCOSE 163*  BUN 40*  CREATININE 0.74  CALCIUM 9.8  MG 2.2  PHOS 2.3*   GFR: Estimated Creatinine Clearance: 50.5 mL/min (by C-G formula based on SCr of 0.74 mg/dL). Liver Function Tests: Recent  Labs  Lab 12/16/17 1155  AST 17  ALT 13  ALKPHOS 69  BILITOT 0.3  PROT 6.2*  ALBUMIN 2.7*   No results for input(s): LIPASE, AMYLASE in the last 168 hours. No results for input(s): AMMONIA in the last 168 hours. Coagulation Profile: No results for input(s): INR, PROTIME in the last 168 hours. Cardiac Enzymes: No results for input(s): CKTOTAL, CKMB, CKMBINDEX, TROPONINI in the last 168 hours. BNP (last 3 results) No results for input(s): PROBNP in the last 8760 hours. HbA1C: No results for input(s): HGBA1C in the last 72 hours. CBG: Recent Labs  Lab 12/12/17 1627 12/12/17 2244 12/16/17 0149 12/16/17 0749 12/16/17 1108  GLUCAP 103* 153* 121* 146* 111*   Lipid Profile: No results for input(s): CHOL, HDL, LDLCALC, TRIG, CHOLHDL, LDLDIRECT in the last 72 hours. Thyroid Function Tests: No results for input(s): TSH, T4TOTAL, FREET4, T3FREE, THYROIDAB in the last 72 hours. Anemia Panel: No results for input(s): VITAMINB12, FOLATE, FERRITIN, TIBC, IRON, RETICCTPCT in the last 72 hours. Sepsis Labs: No results for input(s): PROCALCITON, LATICACIDVEN in the last 168 hours.  No results found for this or any previous visit (from the past 240 hour(s)).     Radiology Studies: No results found.    Scheduled Meds: . chlorhexidine  15 mL Mouth Rinse BID  . collagenase   Topical Daily  . enoxaparin (LOVENOX) injection  30 mg Subcutaneous Q24H  . feeding supplement (OSMOLITE 1.5 CAL)  237 mL Per Tube 5 X Daily  . feeding supplement (PRO-STAT SUGAR FREE 64)  30 mL Per Tube Daily  . ferrous sulfate  325 mg Oral Q breakfast  . fluvoxaMINE  150 mg Per Tube QHS  . free water  350 mL Per Tube Q6H  . magnesium oxide  400 mg Per Tube BID  . mouth rinse  15 mL Mouth Rinse q12n4p  . megestrol  400 mg Per Tube Daily  . mirtazapine  45 mg Per Tube QHS  . nutrition supplement (JUVEN)  1 packet Per Tube BID  . nystatin  5 mL Oral QID  . OLANZapine zydis  20 mg Sublingual QHS  .  phosphorus  500 mg Oral TID WC  . sennosides  5 mL Per Tube QHS  . simvastatin  10 mg Per Tube q1800  . sodium chloride flush  3 mL Intravenous Q12H  . thiamine  100 mg Oral Daily  . vitamin C  250 mg Per Tube BID   Continuous Infusions:   LOS: 74 days    Time spent: 10 minutes   Noralee Stain, DO [facility] [email or web address] Password [facility] 12/18/2017, 12:33 PM

## 2017-12-19 NOTE — Clinical Social Work Note (Signed)
CSW updated patient's IVC paperwork today. Talked with staff psychiatrist, Dr. Sharma CovertNorman later regarding patient and was advised that Ms. Sheller's discharge disposition will be changed to a skilled nursing facility with follow-up with a psychiatrist who can offer the treatment services patient needs, over and above medication her psychiatric medication regime.  Genelle BalVanessa Plez Belton, MSW, LCSW Licensed Clinical Social Worker Clinical Social Work Department [facility] [phone]

## 2017-12-19 NOTE — Progress Notes (Signed)
PROGRESS NOTE    Amanda BreezeDana State  ZOX:[id number] DOB: December 03, 1950 DOA: 10/05/2017 PCP: Gordy SaversKwiatkowski, Peter F, MD     Brief Narrative:  Amanda BreezeDana Hall is a 67 yo female with history of depression, anxiety.  Patient was presented to the hospital secondary to confusion and agitation.  She has been seen by psychiatry who has recommended inpatient psych placement.  New events last 24 hours / Subjective: No new issues or complaints  Assessment & Plan:   Principal Problem:   MDD (major depressive disorder), recurrent severe, without psychosis (HCC) Active Problems:   MDD (major depressive disorder)   Anorexia   Dysphagia   Severe malnutrition (HCC)   Anxiety   Pressure injury of skin   MDD (major depressive disorder), recurrent severe -Psychiatry was consulted, recommending inpatient psych placement -Continue Zyprexa 20 mg qhs for mood stabilization and anxiety. ContinueLuvox 150 mg qhsfor depression and anxietyandRemeron 45 mg qhsfor depression and anxiety. Continue Klonopin 0.5 mg TID PRN.  -Patent attorneyContinue safety sitter -IVC paperwork completed, no acute issues,still needs inpatient psych facility  Dysphagia status post PEG tube placement -Continue free water flushes, PEG tube feedings  Persistent anorexia with severe protein calorie malnutrition -Cortrack was placed during this admission, does not have capacity, per patient's sister next of kin, G-tube was placed 11/8  Transient hypertension and tachycardia -Noted, suspect secondary to anxiety -Continue to monitor vital signs  Acute kidney injury -Likely due to dehydration, anorexia, severe protein calorie malnutrition, resolved  Iron deficiency anemia -Continue iron supplementation  Pressure injury of skin -Stage II, partial-thickness, left buttocks, not present on admission -Wound care per nursing -Does not appear to be acutely infected  DVT prophylaxis: Lovenox Code Status: Full code Family Communication: No family at  bedside Disposition Plan: Inpatient psych placement, complicated due to PEG care   Antimicrobials:  Anti-infectives (From admission, onward)   Start     Dose/Rate Route Frequency Ordered Stop   11/28/17 0915  ceFAZolin (ANCEF) IVPB 1 g/50 mL premix     1 g 100 mL/hr over 30 Minutes Intravenous To Radiology 11/28/17 0907 11/29/17 0915   11/28/17 0847  ceFAZolin (ANCEF) 2-4 GM/100ML-% IVPB    Note to Pharmacy:  Domenica FailKlesch, [name]   : cabinet override      11/28/17 0847 11/28/17 1046   11/28/17 0845  ceFAZolin (ANCEF) powder 1 g  Status:  Discontinued     1 g Other To Surgery 11/28/17 0830 11/28/17 0907   10/23/17 0830  cefTRIAXone (ROCEPHIN) 1 g in sodium chloride 0.9 % 100 mL IVPB  Status:  Discontinued     1 g 200 mL/hr over 30 Minutes Intravenous Daily 10/23/17 0721 10/26/17 1400       Objective: Vitals:   12/18/17 1937 12/18/17 2210 12/19/17 0500 12/19/17 0835  BP: 111/61   135/89  Pulse: 95   (!) 111  Resp: 19   17  Temp: 98.5 F (36.9 C)   98.6 F (37 C)  TempSrc: Oral   Axillary  SpO2: 100%     Weight:  46.9 kg 46.9 kg   Height:        Intake/Output Summary (Last 24 hours) at 12/19/2017 1231 Last data filed at 12/19/2017 0600 Gross per 24 hour  Intake 474 ml  Output 701 ml  Net -227 ml   Filed Weights   12/18/17 0300 12/18/17 2210 12/19/17 0500  Weight: 46.2 kg 46.9 kg 46.9 kg    Examination: General exam: Appears calm and comfortable, very thin, cachectic, frail Respiratory system:  Clear to auscultation. Respiratory effort normal. Cardiovascular system: S1 & S2 heard, tachycardic, regular. No JVD, murmurs, rubs, gallops or clicks. No pedal edema. Gastrointestinal system: Abdomen is nondistended, soft and nontender. No organomegaly or masses felt. Normal bowel sounds heard. Central nervous system: Alert  Extremities: Symmetric   Data Reviewed: I have personally reviewed following labs and imaging studies  CBC: Recent Labs  Lab 12/16/17 1155  WBC 8.0    HGB 10.5*  HCT 32.5*  MCV 95.0  PLT 389   Basic Metabolic Panel: Recent Labs  Lab 12/16/17 1155  NA 143  K 4.2  CL 104  CO2 28  GLUCOSE 163*  BUN 40*  CREATININE 0.74  CALCIUM 9.8  MG 2.2  PHOS 2.3*   GFR: Estimated Creatinine Clearance: 51.2 mL/min (by C-G formula based on SCr of 0.74 mg/dL). Liver Function Tests: Recent Labs  Lab 12/16/17 1155  AST 17  ALT 13  ALKPHOS 69  BILITOT 0.3  PROT 6.2*  ALBUMIN 2.7*   No results for input(s): LIPASE, AMYLASE in the last 168 hours. No results for input(s): AMMONIA in the last 168 hours. Coagulation Profile: No results for input(s): INR, PROTIME in the last 168 hours. Cardiac Enzymes: No results for input(s): CKTOTAL, CKMB, CKMBINDEX, TROPONINI in the last 168 hours. BNP (last 3 results) No results for input(s): PROBNP in the last 8760 hours. HbA1C: No results for input(s): HGBA1C in the last 72 hours. CBG: Recent Labs  Lab 12/12/17 1627 12/12/17 2244 12/16/17 0149 12/16/17 0749 12/16/17 1108  GLUCAP 103* 153* 121* 146* 111*   Lipid Profile: No results for input(s): CHOL, HDL, LDLCALC, TRIG, CHOLHDL, LDLDIRECT in the last 72 hours. Thyroid Function Tests: No results for input(s): TSH, T4TOTAL, FREET4, T3FREE, THYROIDAB in the last 72 hours. Anemia Panel: No results for input(s): VITAMINB12, FOLATE, FERRITIN, TIBC, IRON, RETICCTPCT in the last 72 hours. Sepsis Labs: No results for input(s): PROCALCITON, LATICACIDVEN in the last 168 hours.  No results found for this or any previous visit (from the past 240 hour(s)).     Radiology Studies: No results found.    Scheduled Meds: . chlorhexidine  15 mL Mouth Rinse BID  . collagenase   Topical Daily  . enoxaparin (LOVENOX) injection  30 mg Subcutaneous Q24H  . feeding supplement (OSMOLITE 1.5 CAL)  237 mL Per Tube 5 X Daily  . feeding supplement (PRO-STAT SUGAR FREE 64)  30 mL Per Tube Daily  . ferrous sulfate  325 mg Oral Q breakfast  . fluvoxaMINE   150 mg Per Tube QHS  . free water  350 mL Per Tube Q6H  . magnesium oxide  400 mg Per Tube BID  . mouth rinse  15 mL Mouth Rinse q12n4p  . megestrol  400 mg Per Tube Daily  . mirtazapine  45 mg Per Tube QHS  . nutrition supplement (JUVEN)  1 packet Per Tube BID  . nystatin  5 mL Oral QID  . OLANZapine zydis  20 mg Sublingual QHS  . phosphorus  500 mg Oral TID WC  . sennosides  5 mL Per Tube QHS  . simvastatin  10 mg Per Tube q1800  . sodium chloride flush  3 mL Intravenous Q12H  . thiamine  100 mg Oral Daily  . vitamin C  250 mg Per Tube BID   Continuous Infusions:   LOS: 75 days    Time spent: 10 minutes   Noralee Stain, DO [facility] [email or web address] Password [facility] 12/19/2017, 12:31 PM

## 2017-12-19 NOTE — Progress Notes (Signed)
Occupational Therapy Treatment Patient Details Name: Amanda Hall MRN: [id number] DOB: 1950/02/16 Today's Date: 12/19/2017    History of present illness Pt is a 67 y/o female admitted secondary to major depressive disorder. Pt with increased confusion and agitation prior to admission. Found to have AKI and acute encephalopathy. CT negative for acute abnormality. PMH includes anxiety and depression.    OT comments  Pt required extensive assist for bed mobility to sit EOB and needed mod - max A for balance and support to sit EOB x 7 minutes. Pt washed face and hands with max A and hand over hand to initiate. OT  Will continue to follow acutely  Follow Up Recommendations       Equipment Recommendations  Other (comment)(TBD at next venue of care)    Recommendations for Other Services      Precautions / Restrictions Precautions Precautions: Fall Precaution Comments: has sitter Restrictions Weight Bearing Restrictions: No       Mobility Bed Mobility Overal bed mobility: Needs Assistance Bed Mobility: Sit to Supine;Supine to Sit     Supine to sit: HOB elevated;Max assist Sit to supine: Total assist   General bed mobility comments: pt required mod - max A for balance/support x 7 minutes  Transfers Overall transfer level: Needs assistance               General transfer comment: pt declined/unable due to fatigue    Balance Overall balance assessment: Needs assistance Sitting-balance support: Bilateral upper extremity supported Sitting balance-Leahy Scale: Poor                                     ADL either performed or assessed with clinical judgement   ADL       Grooming: Wash/dry face;Sitting;Maximal assistance;Wash/dry hands                                 General ADL Comments: pt sat EOB x 7 minutes with mod - max A for balance/support     Vision Baseline Vision/History: Wears glasses     Perception     Praxis       Cognition Arousal/Alertness: Awake/alert Behavior During Therapy: Anxious;WFL for tasks assessed/performed Overall Cognitive Status: No family/caregiver present to determine baseline cognitive functioning                                 General Comments: pt keeping eyes closed, speaking incoherently        Exercises     Shoulder Instructions       General Comments      Pertinent Vitals/ Pain       Pain Assessment: Faces Faces Pain Scale: Hurts even more Pain Location: R hand and L shoulder Pain Descriptors / Indicators: Grimacing;Moaning Pain Intervention(s): Limited activity within patient's tolerance;Monitored during session;Repositioned  Home Living                                          Prior Functioning/Environment              Frequency  Min 2X/week        Progress Toward Goals  OT Goals(current goals can now be found  in the care plan section)  Progress towards OT goals: OT to reassess next treatment  Acute Rehab OT Goals Potential to Achieve Goals: Fair  Plan Discharge plan remains appropriate    Co-evaluation                 AM-PAC OT "6 Clicks" Daily Activity     Outcome Measure   Help from another person eating meals?: Total Help from another person taking care of personal grooming?: A Lot Help from another person toileting, which includes using toliet, bedpan, or urinal?: Total Help from another person bathing (including washing, rinsing, drying)?: Total Help from another person to put on and taking off regular upper body clothing?: Total Help from another person to put on and taking off regular lower body clothing?: Total 6 Click Score: 7    End of Session    OT Visit Diagnosis: Other abnormalities of gait and mobility (R26.89);Muscle weakness (generalized) (M62.81);Pain Pain - part of body: (R hand and L shoulder)   Activity Tolerance Patient limited by fatigue;Patient limited by pain    Patient Left in bed;with nursing/sitter in room;with call bell/phone within reach   Nurse Communication          Time: [id number] OT Time Calculation (min): 25 min  Charges: OT General Charges $OT Visit: 1 Visit OT Treatments $Therapeutic Activity: 23-37 mins     Galen ManilaSpencer, Soraida Vickers Jeanette 12/19/2017, 12:59 PM

## 2017-12-19 NOTE — Consult Note (Addendum)
[facility] Psych Consult Progress Note  12/19/2017 2:52 PM Briawna Carver  MRN:  [id number] Subjective:   Ms. Goebel was last seen by the psychiatry consult service on 12/2. Today she reports that she is "pretty good." She denies SI, HI or AVH. She reports that she has been exercising and demonstrates how she is moving her arms and legs. She worked with OT today and was able to sit on the edge of the bed. Her appetite is still poor. She eats a couple bites of icecream according to her sitter. She denies concerns today but does ask if she will be transferred to another facility. That has been a worry for her for a while.    Principal Problem: MDD (major depressive disorder), recurrent severe, without psychosis (HCC) Diagnosis:   Patient Active Problem List   Diagnosis Date Noted  . Pressure injury of skin [L89.90] 11/22/2017  . Anxiety [F41.9] 10/17/2017  . Dysphagia [R13.10] 10/12/2017  . Severe malnutrition (HCC) [E43] 10/12/2017  . MDD (major depressive disorder), recurrent severe, without psychosis (HCC) [F33.2]   . Anorexia [R63.0] 10/05/2017  . Uremia [N19]   . Protein-calorie malnutrition, severe [E43] 08/29/2017  . Generalized anxiety disorder [F41.1] 05/26/2017  . MDD (major depressive disorder) [F32.9] 05/26/2017  . Hypercholesterolemia [E78.00] 03/22/2017  . Nonspecific (abnormal) findings on radiological and other examination of body structure [793] 12/26/2006  . NONSPECIFIC ABNORM FIND RAD&OTH EXAM LUNG FIELD [R93.0] 12/26/2006  . ANEMIA-IRON DEFICIENCY [D50.9] 12/15/2006  . Depression [F32.9] 12/15/2006  . Allergic rhinitis [J30.9] 12/15/2006   Total Time spent with patient: 15 minutes  Past Psychiatric History: MDD and anxiety.   Past Medical History:  Past Medical History:  Diagnosis Date  . Allergy   . Anemia   . Anxiety   . Depression   . Hemorrhoids     Past Surgical History:  Procedure Laterality Date  . arm surgery     Left  . IR GASTROSTOMY TUBE MOD SED   11/28/2017  . TUBAL LIGATION     Family History:  Family History  Problem Relation Age of Onset  . Stroke Mother   . Hypertension Sister   . Cancer Neg Hx        breat and colon hx  . Diabetes Neg Hx        family   Family Psychiatric  History: Grandfather-depression and committed suicide.  Social History:  Social History   Substance and Sexual Activity  Alcohol Use No     Social History   Substance and Sexual Activity  Drug Use No    Social History   Socioeconomic History  . Marital status: Divorced    Spouse name: Not on file  . Number of children: Not on file  . Years of education: Not on file  . Highest education level: Not on file  Occupational History  . Not on file  Social Needs  . Financial resource strain: Not on file  . Food insecurity:    Worry: Not on file    Inability: Not on file  . Transportation needs:    Medical: Not on file    Non-medical: Not on file  Tobacco Use  . Smoking status: Never Smoker  . Smokeless tobacco: Never Used  Substance and Sexual Activity  . Alcohol use: No  . Drug use: No  . Sexual activity: Not Currently  Lifestyle  . Physical activity:    Days per week: Not on file    Minutes per session: Not on file  .  Stress: Not on file  Relationships  . Social connections:    Talks on phone: Not on file    Gets together: Not on file    Attends religious service: Not on file    Active member of club or organization: Not on file    Attends meetings of clubs or organizations: Not on file    Relationship status: Not on file  Other Topics Concern  . Not on file  Social History Narrative  . Not on file    Sleep: Good  Appetite:  Poor  Current Medications: Current Facility-Administered Medications  Medication Dose Route Frequency Provider Last Rate Last Dose  . acetaminophen (TYLENOL) tablet 650 mg  650 mg Oral Q6H PRN Standley BrookingGoodrich, Daniel P, MD   650 mg at 12/16/17 1124   Or  . acetaminophen (TYLENOL) suppository 650 mg   650 mg Rectal Q6H PRN Standley BrookingGoodrich, Daniel P, MD      . chlorhexidine (PERIDEX) 0.12 % solution 15 mL  15 mL Mouth Rinse BID Hall, Carole N, DO   15 mL at 12/19/17 1104  . clonazePAM (KLONOPIN) tablet 0.5 mg  0.5 mg Per Tube TID PRN Tyrone NineGrunz, Ryan B, MD   0.5 mg at 12/18/17 2350  . collagenase (SANTYL) ointment   Topical Daily Dow AdolphHall, Carole N, DO      . enoxaparin (LOVENOX) injection 30 mg  30 mg Subcutaneous Q24H Narda BondsNettey, Ralph A, MD   30 mg at 12/19/17 1103  . feeding supplement (OSMOLITE 1.5 CAL) liquid 237 mL  237 mL Per Tube 5 X Daily Noralee Stainhoi, Jennifer, DO   237 mL at 12/19/17 1105  . feeding supplement (PRO-STAT SUGAR FREE 64) liquid 30 mL  30 mL Per Tube Daily Noralee Stainhoi, Jennifer, DO   30 mL at 12/19/17 1103  . ferrous sulfate tablet 325 mg  325 mg Oral Q breakfast Dow AdolphHall, Carole N, DO   325 mg at 12/19/17 1104  . fluvoxaMINE (LUVOX) tablet 150 mg  150 mg Per Tube QHS Blount, Xenia T, NP   150 mg at 12/18/17 2352  . free water 350 mL  350 mL Per Tube Q6H Hall, Carole N, DO   350 mL at 12/19/17 0646  . HYDROcodone-acetaminophen (NORCO/VICODIN) 5-325 MG per tablet 1-2 tablet  1-2 tablet Oral Q6H PRN Macon LargeBodenheimer, Charles A, NP   1 tablet at 12/18/17 2350  . labetalol (NORMODYNE,TRANDATE) injection 5 mg  5 mg Intravenous Q2H PRN Jerald Kiefhiu, Stephen K, MD   5 mg at 11/04/17 1133  . loperamide (IMODIUM) capsule 2 mg  2 mg Oral PRN Jerald Kiefhiu, Stephen K, MD   2 mg at 12/17/17 2107  . magnesium oxide (MAG-OX) tablet 400 mg  400 mg Per Tube BID Regalado, Belkys A, MD   400 mg at 12/19/17 1103  . MEDLINE mouth rinse  15 mL Mouth Rinse q12n4p Dow AdolphHall, Carole N, DO   15 mL at 12/16/17 1528  . megestrol (MEGACE) 400 MG/10ML suspension 400 mg  400 mg Per Tube Daily Blount, Xenia T, NP   400 mg at 12/19/17 1104  . milk and molasses enema  1 enema Rectal Daily PRN Regalado, Belkys A, MD      . mirtazapine (REMERON SOL-TAB) disintegrating tablet 45 mg  45 mg Per Tube QHS Blount, Xenia T, NP   45 mg at 12/18/17 2352  . naphazoline-glycerin  (CLEAR EYES REDNESS) ophth solution 2 drop  2 drop Both Eyes QID PRN Standley BrookingGoodrich, Daniel P, MD      . nutrition supplement (  JUVEN) (JUVEN) powder packet 1 packet  1 packet Per Tube BID Noralee Stain, DO   1 packet at 12/19/17 1105  . nystatin (MYCOSTATIN) 100000 UNIT/ML suspension 500,000 Units  5 mL Oral QID Dow Adolph N, DO   500,000 Units at 12/19/17 1103  . OLANZapine zydis (ZYPREXA) disintegrating tablet 20 mg  20 mg Sublingual QHS Hall, Carole N, DO   20 mg at 12/18/17 2353  . ondansetron (ZOFRAN) tablet 4 mg  4 mg Per Tube Q6H PRN Blount, Andi Devon T, NP       Or  . ondansetron (ZOFRAN) injection 4 mg  4 mg Intravenous Q6H PRN Audrea Muscat T, NP   4 mg at 11/15/17 0457  . phosphorus (K PHOS NEUTRAL) tablet 500 mg  500 mg Oral TID WC Tyrone Nine, MD   500 mg at 12/19/17 1105  . sennosides (SENOKOT) 8.8 MG/5ML syrup 5 mL  5 mL Per Tube QHS Blount, Xenia T, NP   5 mL at 12/16/17 2253  . simvastatin (ZOCOR) tablet 10 mg  10 mg Per Tube q1800 Audrea Muscat T, NP   10 mg at 12/18/17 1647  . sodium chloride flush (NS) 0.9 % injection 3 mL  3 mL Intravenous Q12H Standley Brooking, MD   3 mL at 12/19/17 1106  . thiamine (VITAMIN B-1) tablet 100 mg  100 mg Oral Daily Tyrone Nine, MD   100 mg at 12/19/17 1104  . vitamin C (ASCORBIC ACID) tablet 250 mg  250 mg Per Tube BID Tyrone Nine, MD   250 mg at 12/19/17 1104    Lab Results:  No results found for this or any previous visit (from the past 48 hour(s)).  Blood Alcohol level:  Lab Results  Component Value Date   ETH <10 06/12/2017   ETH <10 05/25/2017    Musculoskeletal: Strength & Muscle Tone: Generalized weakness. Gait & Station: unable to stand Patient leans: N/A  Psychiatric Specialty Exam: Physical Exam  Nursing note and vitals reviewed. Constitutional: She is oriented to person, place, and time. She appears well-developed.  Thin   HENT:  Head: Normocephalic and atraumatic.  Neck: Normal range of motion.  Respiratory:  Effort normal.  Musculoskeletal: Normal range of motion.  Neurological: She is alert and oriented to person, place, and time.  Psychiatric: Her speech is normal and behavior is normal. Judgment and thought content normal. Her mood appears anxious (improving). Cognition and memory are impaired.    Review of Systems  Cardiovascular: Negative for chest pain.  Gastrointestinal: Negative for abdominal pain, constipation, diarrhea, nausea and vomiting.  Psychiatric/Behavioral: Negative for hallucinations, substance abuse and suicidal ideas. The patient is nervous/anxious. The patient does not have insomnia.   All other systems reviewed and are negative.   Blood pressure 135/89, pulse (!) 111, temperature 98.6 F (37 C), temperature source Axillary, resp. rate 17, height 5\' 2"  (1.575 m), weight 46.9 kg, SpO2 100 %.Body mass index is 18.91 kg/m.  General Appearance: Fairly Groomed, elderly, cachetic, Caucasian female, wearing a hospital gown with hair in bun and [place] in place who is lying in bed. NAD.   Eye Contact:  Fair  Speech:  Clear and Coherent and Normal Rate  Volume:  Normal  Mood:  Anxious  Affect:  Constricted  Thought Process:  Goal Directed, Linear and Descriptions of Associations: Intact  Orientation:  Full (Time, Place, and Person)  Thought Content:  Logical  Suicidal Thoughts:  No  Homicidal Thoughts:  No  Memory:  Immediate;   Fair Recent;   Fair Remote;   Fair  Judgement:  Impaired  Insight:  Shallow  Psychomotor Activity:  Normal  Concentration:  Concentration: Good and Attention Span: Good  Recall:  Good  Fund of Knowledge:  Fair  Language:  Good  Akathisia:  No  Handed:  Right  AIMS (if indicated):   N/A  Assets:  Housing Social Support  ADL's:  Impaired  Cognition:  Impaired with poor decision making.    Sleep:   Fair   Assessment:  Amanda Hall is a 67 y.o. female who was admitted with AKI secondary to poor PO intake. She has had a gradual improvement in her  anxiety since admission in September. She still reports subjective worries/anxiety but clinically appears improved. She is no longer visibly tremulous. Her appetite remains poor with significant weight loss requiring PEG tube placement. It would be most beneficial for her to discharge to a SNF to get assistance with her ADLs and medication management. She has been unsuccessful at getting a bed at [facility] for a few months now and this has likely only caused her to have a dependence for hospital care. She will likely receive the mental health treatment she needs quicker by establishing care with an outpatient psychiatrist and then being referred for other mental health services that are deemed appropriate such as a non medication treatment modalities (ECT or TMS if medically stable enough to undergo). She appears to have reached the maximal benefit with medication management.      Treatment Plan Summary: -Continue Zyprexa 20 mg qhs for mood stabilization and anxiety.  -Continue Luvox 150 mg qhsfor depression and anxiety. -ContinueRemeron 45 mg qhsfor depression and anxiety. -Continue Klonopin 0.5 mg TID PRN.  -EKG reviewed and QTc435 on 11/4. Please closely monitor when starting or increasing QTc prolonging agents. -Patient should establish care with an outpatient psychiatrist for further medication management and alternative treatment options.  -Recommend outpatient psychiatrist, Dr. Neila Gear. Can possibly get an appointment within 1-2 weeks following discharge.  -Patient is psychiatrically cleared. Psychiatry will sign off on patient at this time. Please consult psychiatry again as needed.   Cherly Beach, DO 12/19/2017, 2:52 PM

## 2017-12-20 NOTE — Progress Notes (Signed)
PROGRESS NOTE    Amanda Hall  ZOX:[id number] DOB: May 19, 1950 DOA: 10/05/2017 PCP: [name], MD     Brief Narrative:  Amanda Hall is a 67 yo female with history of depression, anxiety.  Patient was presented to the hospital secondary to confusion and agitation.  She has been seen by psychiatry who has recommended inpatient psych placement.  Re-evaluated by psych 12/9, now recommending SNF placement with outpatient psych referral.   New events last 24 hours / Subjective: Didn't sleep well overnight per sitter at bedside.   Assessment & Plan:   Principal Problem:   MDD (major depressive disorder), recurrent severe, without psychosis (HCC) Active Problems:   MDD (major depressive disorder)   Anorexia   Dysphagia   Severe malnutrition (HCC)   Anxiety   Pressure injury of skin   MDD (major depressive disorder), recurrent severe -Psychiatry was consulted, recommending inpatient psych placement -Continue Zyprexa 20 mg qhs for mood stabilization and anxiety. ContinueLuvox 150 mg qhsfor depression and anxietyandRemeron 45 mg qhsfor depression and anxiety. Continue Klonopin 0.5 mg TID PRN.  -Continue safety sitter  Dysphagia status post PEG tube placement -Continue free water flushes, PEG tube feedings  Persistent anorexia with severe protein calorie malnutrition -Cortrack was placed during this admission, does not have capacity, per patient's sister next of kin, G-tube was placed 11/8  Transient hypertension and tachycardia -Noted, suspect secondary to anxiety -Continue to monitor vital signs  Acute kidney injury -Likely due to dehydration, anorexia, severe protein calorie malnutrition, resolved  Iron deficiency anemia -Continue iron supplementation  Pressure injury of skin -Stage II, partial-thickness, left buttocks, not present on admission -Wound care per nursing -Does not appear to be acutely infected  DVT prophylaxis: Lovenox Code Status: Full  code Family Communication: No family at bedside Disposition Plan: SNF    Antimicrobials:  Anti-infectives (From admission, onward)   Start     Dose/Rate Route Frequency Ordered Stop   11/28/17 0915  ceFAZolin (ANCEF) IVPB 1 g/50 mL premix     1 g 100 mL/hr over 30 Minutes Intravenous To Radiology 11/28/17 0907 11/29/17 0915   11/28/17 0847  ceFAZolin (ANCEF) 2-4 GM/100ML-% IVPB    Note to Pharmacy:  Domenica Fail   : cabinet override      11/28/17 0847 11/28/17 1046   11/28/17 0845  ceFAZolin (ANCEF) powder 1 g  Status:  Discontinued     1 g Other To Surgery 11/28/17 0830 11/28/17 0907   10/23/17 0830  cefTRIAXone (ROCEPHIN) 1 g in sodium chloride 0.9 % 100 mL IVPB  Status:  Discontinued     1 g 200 mL/hr over 30 Minutes Intravenous Daily 10/23/17 0721 10/26/17 1400       Objective: Vitals:   12/19/17 1742 12/19/17 2054 12/20/17 0455 12/20/17 1000  BP: 125/62 (!) 111/55 128/72 124/68  Pulse: (!) 102 (!) 103 (!) 108 (!) 101  Resp: 16 19 19 18   Temp: 98.5 F (36.9 C) 98.4 F (36.9 C) 98.4 F (36.9 C) 98.4 F (36.9 C)  TempSrc: Oral Oral Oral Oral  SpO2: 99% 100% 98% 98%  Weight:      Height:        Intake/Output Summary (Last 24 hours) at 12/20/2017 1315 Last data filed at 12/20/2017 1031 Gross per 24 hour  Intake 711 ml  Output 702 ml  Net 9 ml   Filed Weights   12/18/17 0300 12/18/17 2210 12/19/17 0500  Weight: 46.2 kg 46.9 kg 46.9 kg    Examination: General exam:  Appears calm and comfortable, thin and cachectic  Respiratory system: Clear to auscultation. Respiratory effort normal. Cardiovascular system: S1 & S2 heard, tachycardic, regular. No JVD, murmurs, rubs, gallops or clicks. No pedal edema. Gastrointestinal system: Abdomen is nondistended, soft and nontender. No organomegaly or masses felt. Normal bowel sounds heard. Extremities: Symmetric   Data Reviewed: I have personally reviewed following labs and imaging studies  CBC: Recent Labs  Lab  12/16/17 1155  WBC 8.0  HGB 10.5*  HCT 32.5*  MCV 95.0  PLT 389   Basic Metabolic Panel: Recent Labs  Lab 12/16/17 1155  NA 143  K 4.2  CL 104  CO2 28  GLUCOSE 163*  BUN 40*  CREATININE 0.74  CALCIUM 9.8  MG 2.2  PHOS 2.3*   GFR: Estimated Creatinine Clearance: 51.2 mL/min (by C-G formula based on SCr of 0.74 mg/dL). Liver Function Tests: Recent Labs  Lab 12/16/17 1155  AST 17  ALT 13  ALKPHOS 69  BILITOT 0.3  PROT 6.2*  ALBUMIN 2.7*   No results for input(s): LIPASE, AMYLASE in the last 168 hours. No results for input(s): AMMONIA in the last 168 hours. Coagulation Profile: No results for input(s): INR, PROTIME in the last 168 hours. Cardiac Enzymes: No results for input(s): CKTOTAL, CKMB, CKMBINDEX, TROPONINI in the last 168 hours. BNP (last 3 results) No results for input(s): PROBNP in the last 8760 hours. HbA1C: No results for input(s): HGBA1C in the last 72 hours. CBG: Recent Labs  Lab 12/16/17 0149 12/16/17 0749 12/16/17 1108  GLUCAP 121* 146* 111*   Lipid Profile: No results for input(s): CHOL, HDL, LDLCALC, TRIG, CHOLHDL, LDLDIRECT in the last 72 hours. Thyroid Function Tests: No results for input(s): TSH, T4TOTAL, FREET4, T3FREE, THYROIDAB in the last 72 hours. Anemia Panel: No results for input(s): VITAMINB12, FOLATE, FERRITIN, TIBC, IRON, RETICCTPCT in the last 72 hours. Sepsis Labs: No results for input(s): PROCALCITON, LATICACIDVEN in the last 168 hours.  No results found for this or any previous visit (from the past 240 hour(s)).     Radiology Studies: No results found.    Scheduled Meds: . chlorhexidine  15 mL Mouth Rinse BID  . collagenase   Topical Daily  . enoxaparin (LOVENOX) injection  30 mg Subcutaneous Q24H  . feeding supplement (OSMOLITE 1.5 CAL)  237 mL Per Tube 5 X Daily  . feeding supplement (PRO-STAT SUGAR FREE 64)  30 mL Per Tube Daily  . ferrous sulfate  325 mg Oral Q breakfast  . fluvoxaMINE  150 mg Per Tube  QHS  . free water  350 mL Per Tube Q6H  . magnesium oxide  400 mg Per Tube BID  . mouth rinse  15 mL Mouth Rinse q12n4p  . megestrol  400 mg Per Tube Daily  . mirtazapine  45 mg Per Tube QHS  . nutrition supplement (JUVEN)  1 packet Per Tube BID  . nystatin  5 mL Oral QID  . OLANZapine zydis  20 mg Sublingual QHS  . phosphorus  500 mg Oral TID WC  . sennosides  5 mL Per Tube QHS  . simvastatin  10 mg Per Tube q1800  . sodium chloride flush  3 mL Intravenous Q12H  . thiamine  100 mg Oral Daily  . vitamin C  250 mg Per Tube BID   Continuous Infusions:   LOS: 76 days    Time spent: 10 minutes   Noralee StainJennifer Monquie Fulgham, DO [facility] [email or web address] Password [email or web address] 12/20/2017, 1:15 PM

## 2017-12-20 NOTE — Clinical Social Work Note (Signed)
Clinical Social Work Assessment  Patient Details  Name: Amanda Hall MRN: [id number] Date of Birth: 02/06/1950  Date of referral:  12/19/17(Psych recommendation changed from Inpatient psych to SNF for rehab)               Reason for consult:  Discharge Planning, Facility Placement                Permission sought to share information with:  Family Supports Permission granted to share information::  No(Patient not oriented to given permission)  Name::        Agency::     Relationship::     Contact Information:     Housing/Transportation Living arrangements for the past 2 months:  Single Family Home(Patient has been hospitalized since 10/05/17. Patient and her sister lived together prior to hospitalization) Source of Information:  Siblings, Other (Comment Required)(Chart) Patient Interpreter Needed:  None Criminal Activity/Legal Involvement Pertinent to Current Situation/Hospitalization:  No - Comment as needed Significant Relationships:  Siblings(Sister Amanda Hall) Lives with:  Siblings Do you feel safe going back to the place where you live?  No(Sister's concern is that due to her own health issues, she will be unable to take care of patient.) Need for family participation in patient care:  Yes (Comment)  Care giving concerns:  Patient's sister reported that she has health issues and would not be able to provide care for patient at home.  Social Worker assessment / plan:  CSW talked with patient's sister, Amanda SpineSheila Stipe regarding patient's discharge disposition. She was advised that patient determined to no longer need inpatient psych treatment, but will need psych treatment at discharge. Sister also advised that SNF placement recommended, however patient would not participate with PT and they signed off 11/8 and the ramifications of this discussed. When asked, Amanda Hall expressed concern for her sister and added that her sister does not have and has never applied for Medicaid.  She inquired  if the application could be completed while patient in the hospital and this was discussed. Sister advised that she will be kept informed regarding patient's discharge.    Employment status:  Disabled (Comment on whether or not currently receiving Disability) Insurance information:  Medicare PT Recommendations:  Not assessed at this time(PT signed off 11/18/17) Information / Referral to community resources:  Skilled Nursing Facility(SNF list emailed to sister)  Patient/Family's Response to care: Amanda Hall did not express any concerns regarding her sister's care during hospitalization.  Patient/Family's Understanding of and Emotional Response to Diagnosis, Current Treatment, and Prognosis:  Sister expressed awareness that her sister needs more care than she can provide, and understands that patient does not have a payor for LTC. She is hopeful that SW can assist in getting Medicaid application completed while patient in hospital.  Emotional Assessment Appearance:  Other (Comment Required(Talked with sister due to patient's orientation status) Attitude/Demeanor/Rapport:  Unable to Assess Affect (typically observed):  Unable to Assess Orientation:  Oriented to Self Alcohol / Substance use:  Tobacco Use, Alcohol Use, Illicit Drugs(Patient reported that she has never smoked and does not consume alcohol or use illicit drugs) Psych involvement (Current and /or in the community):  Yes (Comment)(Psych signed off 11/19/17)  Discharge Needs  Concerns to be addressed:  Discharge Planning Concerns, Financial / Insurance Concerns, Mental Health Concerns, Other (Comment Required(Patient not appropriate for ST rehab, however does not have a payor for LTC) Readmission within the last 30 days:  No Current discharge risk:  Inadequate Financial Supports, Psychiatric Illness,  Other(No payior for LTC; Did not participate with physical therapy) Barriers to Discharge:  Inadequate or no insurance(Not approrpiate for ST  rehab and does not have a payor for LTC)   Cristobal Goldmann, LCSW 12/20/2017, 4:53 PM

## 2017-12-20 NOTE — Consult Note (Signed)
WOC nurse attempted to see patient today for new areas on the ear and under her PEG tube, she is sleeping and she has been awake all night per staff. She has psych history and [facility]. Staff to page WOC nurse when patient is awake.  Alaila Pillard [facility] MSN,RN,CWOCN, CNS, [employer] [phone]

## 2017-12-20 NOTE — Consult Note (Signed)
WOC Nurse wound consult note Reason for Consult:ear pressure injury and peg wound Wound type: DTPI -deep tissue pressure injury left ear Unstageable pressure injury right buttock Pressure Injury POA:No Wound bed:  Right buttock: 20% pink at wound periphery; 80% black, eschar centrally Left ear: 100% dark purple skin, non blanchable  Drainage (amount, consistency, odor) sacrum-moderate, no odor Periwound: intact, frail, malnourished  Dressing procedure/placement/frequency: Continue Santyl to the right buttock wound, top with saline moist gauze, dry dressing.  Protect left ear with foam dressing.    WOC Nurse team will follow with you and see patient within 10 days for wound assessments.  Please notify WOC nurses of any acute changes in the wounds or any new areas of concern Dian Minahan [facility] MSN, RN,CWOCN, CNS, CWON-AP [phone]

## 2017-12-20 NOTE — Clinical Social Work Note (Signed)
CSW reviewed patient's chart in preparation of completing FL-21 for SNF placement and read physical therapy note dated 11/8. The therapist indicated in her note: "Pt has been on PT caseload for several weeks without any significant functional improvement noted. Pt has refused therapy x3 this week and would not even open eyes to participate with PT this session. At this time, I do not feel pt is appropriate for continued acute physical therapy, and we will sign off."  MD contacted regarding concerns with finding a facility to accept patient placement due to her noncompliance with PT. MD expressed that patient is not appropriate for ST rehab and did not bring up re-consulting PT as an option. MD also advised that without Medicaid patient not eligible for LTC. MD expressed that she did not feel comfortable discharging patient home.  CSW contacted Ms. Amanda Hall and updated her on concerns regarding placement for patient and she expressed understanding. Ms. Amanda Hall informed CSW that she has some health issues and would not be able to care for patient. Applying for Medicaid was discussed and sister was hopeful that CSW would be able to assist in getting the Medicaid application completed at the hospital. Sister advised that this would be checked into and she will be kept updated. CSW will contact financial counselor on 12/11 regarding Medicaid application for patient.  Genelle BalVanessa Regenia Hall, MSW, LCSW Licensed Clinical Social Worker Clinical Social Work Department [facility] [phone]

## 2017-12-21 NOTE — Care Management Note (Signed)
Case Management Note Note initiated by Tomi BambergerBrenda Graves-Bigelow, RN CM on 12/20/2017  Patient Details  Name: Amanda Hall MRN: [id number] Date of Birth: 1950-08-24  Subjective/Objective:  Pt presented for AKI and acute encephalopathy. Hx of Depression-psych consulted recommended inpatient psych. CSW is following for disposition needs. [facility].                  Action/Plan: CM will continue to monitor for additional disposition needs.   Expected Discharge Date:                  Expected Discharge Plan:  [facility]  In-House Referral:  Clinical Social Work  Discharge planning Services  CM Consult  Post Acute Care Choice:  NA Choice offered to:  NA  DME Arranged:  N/A DME Agency:  NA  HH Arranged:  NA HH Agency:  NA  Status of Service:  Completed, signed off  If discussed at Long Length of Stay Meetings, dates discussed:    Additional Comments:  12/21/17 3:24 PM Bess KindsWendi B Lawana Hartzell, RN Left voicemail for financial counselor to request start of Medicaid application  [id number] 12-20-17 Tomi BambergerBrenda Graves-Bigelow, [place] [phone] Patient with AKI- poor po intake post PEG tube placement 11-18-17. Hx of depressive disorder-psych re-consulted and states gradual improvement. Per Psych most beneficial to transition to SNF. CSW assisting with transition of care needs to facility. CM will continue to monitor for additional transition of care needs.   Bess KindsWendi B Luva Metzger, RN 12/21/2017, 3:23 PM

## 2017-12-21 NOTE — Progress Notes (Signed)
Nutrition Follow-up  DOCUMENTATION CODES:   Severe malnutrition in context of social or environmental circumstances, Underweight  INTERVENTION:   Tube Feeding:  Continue Bolus Feedings of Osmolite 1.5 237 mL (1 can) 5 times daily Add Pro-Stat 30 mL daily Provides 1875 kcals, 90 g of protein and 905 mL of free water  Continue Juven BID via G-tube, each packet provides 80 calories, 8 grams of carbohydrate, 2.5  grams of protein (collagen), 7 grams of L-arginine and 7 grams of L-glutamine; supplement contains CaHMB, Vitamins C, E, B12 and Zinc to promote wound healing  NUTRITION DIAGNOSIS:   Severe Malnutrition related to social / environmental circumstances(severe major depressive disorder, refractory depression, anxiety) as evidenced by severe fat depletion, severe muscle depletion.  Ongoing   GOAL:   Patient will meet greater than or equal to 90% of their needs  Met with TF  MONITOR:   PO intake, Supplement acceptance, Labs, Weight trends  REASON FOR ASSESSMENT:   Malnutrition Screening Tool    ASSESSMENT:   67 yo female admitted with AKI, dehydration, anorexia with acute metabolic encephalopathy, FTT. Pt has been refusing to eat, drink or take medications. Pt has severe protein calorie malnutrition. Pt with recent inpatient psych hospitalization 1 month ago. Pt reports difficulty swallowing although pt's sister believes this to be subjective. Pt with MDD, severe, with psych consult. PMH includes anxiety, depression, malnutrition   Per social work, now awaiting SNF placement due to immobility/PT refusal.   RN reports a new DTI on left ear that is not healing. PEG tube wound had some drainage this morning.   Pt is tolerating Osmolite 1.5 bolus feedings: 237 mL (1 can) 5 times daily + free water 350 mL q 6 hours + Juven BID  RN reports pt had some issues swallowing nystatin this morning. Would recommend SLP consult to reassess swallowing ability.   Weight noted to  increase from 99 lb on 12/5 to 103 lb on 12/9. Will continue to monitor trends.   Medications reviewed and include: ferrous sulfate, MAG-OX, megace once daily, 500 mg K PHOS TID, thiamine, zinc Labs reviewed: Phosphorus 2.3 (L)   Diet Order:   Diet Order            DIET - DYS 1 Room service appropriate? Yes; Fluid consistency: Thin  Diet effective now              EDUCATION NEEDS:   Not appropriate for education at this time  Skin:  Skin Assessment: Skin Integrity Issues: Skin Integrity Issues:: Unstageable, Stage I, DTI DTI:  R. Heel, left ear Stage I: L. Heel Stage II: n/a Unstageable: buttock/sacral region Other: MASD: perineum  Last BM:  12/20/17  Height:   Ht Readings from Last 1 Encounters:  10/05/17 _0  (1.575 m)    Weight:   Wt Readings from Last 1 Encounters:  12/19/17 46.9 kg    Ideal Body Weight:  50 kg  BMI:  Body mass index is 18.91 kg/m.  Estimated Nutritional Needs:   Kcal:  1550-1750 kcals   Protein:  72-82  Fluid:  >/= 1.7 L   Mariana Single RD, LDN Clinical Nutrition Pager # [phone]

## 2017-12-21 NOTE — Progress Notes (Signed)
PROGRESS NOTE    Amanda Hall   ZOX:[id number]  DOB: 10-07-1950  DOA: 10/05/2017 PCP: [name], MD   Brief Narrative:  Amanda Hall is a 67 yo female with history of depression, anxiety.  Patient was presented to the hospital secondary to confusion and agitation.  She has been seen by psychiatry who has recommended inpatient psych placement.  Re-evaluated by psych 12/9, now recommending SNF placement with outpatient psych referral.   Subjective: No complaints.  Assessment & Plan:   Principal Problem:   MDD (major depressive disorder), recurrent severe, without psychosis   Psychiatry was consulted, initially recommending inpatient psych placement -  Cont Zyprexa ,Luvox , Remeron,  Klonopin 0.5 mg TID PRN - per psych, she will likely receive faster treatment if she was to be discharged and now recommending outpt psych  Active Problems:     Dysphagia and poor oral intake,  Severe malnutrition - s/p PEG during this hospital stay   Acute kidney injury -Likely due to dehydration, anorexia, severe protein calorie malnutrition, resolved  Iron deficiency anemia -Continue iron supplementation  Pressure injury of skin -Stage II, partial-thickness, left buttocks, not present on admission -Wound care per nursing    DVT prophylaxis: Lovenox Code Status: Full code Disposition Plan: SW working on this- she has no one to care for her  Consultants:   psych Procedures:   PEG Antimicrobials:  Anti-infectives (From admission, onward)   Start     Dose/Rate Route Frequency Ordered Stop   11/28/17 0915  ceFAZolin (ANCEF) IVPB 1 g/50 mL premix     1 g 100 mL/hr over 30 Minutes Intravenous To Radiology 11/28/17 0907 11/29/17 0915   11/28/17 0847  ceFAZolin (ANCEF) 2-4 GM/100ML-% IVPB    Note to Pharmacy:  Domenica Fail   : cabinet override      11/28/17 0847 11/28/17 1046   11/28/17 0845  ceFAZolin (ANCEF) powder 1 g  Status:  Discontinued     1 g Other To Surgery 11/28/17  0830 11/28/17 0907   10/23/17 0830  cefTRIAXone (ROCEPHIN) 1 g in sodium chloride 0.9 % 100 mL IVPB  Status:  Discontinued     1 g 200 mL/hr over 30 Minutes Intravenous Daily 10/23/17 0721 10/26/17 1400       Objective: Vitals:   12/20/17 1000 12/20/17 1755 12/21/17 0500 12/21/17 0756  BP: 124/68 121/62 119/64 (!) 114/55  Pulse: (!) 101 (!) 105 (!) 102 (!) 117  Resp: 18 18 16 18   Temp: 98.4 F (36.9 C) 98 F (36.7 C) 97.8 F (36.6 C)   TempSrc: Oral Axillary Axillary   SpO2: 98% 99% 98% 100%  Weight:      Height:        Intake/Output Summary (Last 24 hours) at 12/21/2017 0933 Last data filed at 12/21/2017 8119 Gross per 24 hour  Intake 1411 ml  Output 2600 ml  Net -1189 ml   Filed Weights   12/18/17 0300 12/18/17 2210 12/19/17 0500  Weight: 46.2 kg 46.9 kg 46.9 kg    Examination: General exam: Appears comfortable - cachexia  Respiratory system: Clear to auscultation. Respiratory effort normal. Cardiovascular system: S1 & S2 heard, RRR.   Gastrointestinal system: Abdomen soft, non-tender, nondistended. Normal bowel sounds. Central nervous system: Alert and oriented. No focal neurological deficits. Extremities: No cyanosis, clubbing or edema   Data Reviewed: I have personally reviewed following labs and imaging studies  CBC: Recent Labs  Lab 12/16/17 1155  WBC 8.0  HGB 10.5*  HCT 32.5*  MCV 95.0  PLT 389   Basic Metabolic Panel: Recent Labs  Lab 12/16/17 1155  NA 143  K 4.2  CL 104  CO2 28  GLUCOSE 163*  BUN 40*  CREATININE 0.74  CALCIUM 9.8  MG 2.2  PHOS 2.3*   GFR: Estimated Creatinine Clearance: 51.2 mL/min (by C-G formula based on SCr of 0.74 mg/dL). Liver Function Tests: Recent Labs  Lab 12/16/17 1155  AST 17  ALT 13  ALKPHOS 69  BILITOT 0.3  PROT 6.2*  ALBUMIN 2.7*   No results for input(s): LIPASE, AMYLASE in the last 168 hours. No results for input(s): AMMONIA in the last 168 hours. Coagulation Profile: No results for  input(s): INR, PROTIME in the last 168 hours. Cardiac Enzymes: No results for input(s): CKTOTAL, CKMB, CKMBINDEX, TROPONINI in the last 168 hours. BNP (last 3 results) No results for input(s): PROBNP in the last 8760 hours. HbA1C: No results for input(s): HGBA1C in the last 72 hours. CBG: Recent Labs  Lab 12/16/17 0149 12/16/17 0749 12/16/17 1108  GLUCAP 121* 146* 111*   Lipid Profile: No results for input(s): CHOL, HDL, LDLCALC, TRIG, CHOLHDL, LDLDIRECT in the last 72 hours. Thyroid Function Tests: No results for input(s): TSH, T4TOTAL, FREET4, T3FREE, THYROIDAB in the last 72 hours. Anemia Panel: No results for input(s): VITAMINB12, FOLATE, FERRITIN, TIBC, IRON, RETICCTPCT in the last 72 hours. Urine analysis:    Component Value Date/Time   COLORURINE AMBER (A) 10/23/2017 0106   APPEARANCEUR CLOUDY (A) 10/23/2017 0106   LABSPEC 1.009 10/23/2017 0106   PHURINE 9.0 (H) 10/23/2017 0106   GLUCOSEU NEGATIVE 10/23/2017 0106   HGBUR MODERATE (A) 10/23/2017 0106   BILIRUBINUR NEGATIVE 10/23/2017 0106   KETONESUR NEGATIVE 10/23/2017 0106   PROTEINUR NEGATIVE 10/23/2017 0106   UROBILINOGEN 0.2 12/15/2006 1654   NITRITE NEGATIVE 10/23/2017 0106   LEUKOCYTESUR MODERATE (A) 10/23/2017 0106   Sepsis Labs: @LABRCNTIP (procalcitonin:4,lacticidven:4) )No results found for this or any previous visit (from the past 240 hour(s)).       Radiology Studies: No results found.    Scheduled Meds: . chlorhexidine  15 mL Mouth Rinse BID  . collagenase   Topical Daily  . enoxaparin (LOVENOX) injection  30 mg Subcutaneous Q24H  . feeding supplement (OSMOLITE 1.5 CAL)  237 mL Per Tube 5 X Daily  . feeding supplement (PRO-STAT SUGAR FREE 64)  30 mL Per Tube Daily  . ferrous sulfate  325 mg Oral Q breakfast  . fluvoxaMINE  150 mg Per Tube QHS  . free water  350 mL Per Tube Q6H  . magnesium oxide  400 mg Per Tube BID  . mouth rinse  15 mL Mouth Rinse q12n4p  . megestrol  400 mg Per Tube  Daily  . mirtazapine  45 mg Per Tube QHS  . nutrition supplement (JUVEN)  1 packet Per Tube BID  . nystatin  5 mL Oral QID  . OLANZapine zydis  20 mg Sublingual QHS  . phosphorus  500 mg Oral TID WC  . sennosides  5 mL Per Tube QHS  . simvastatin  10 mg Per Tube q1800  . sodium chloride flush  3 mL Intravenous Q12H  . thiamine  100 mg Oral Daily  . vitamin C  250 mg Per Tube BID   Continuous Infusions:   LOS: 77 days    Time spent in minutes: 30    Calvert CantorSaima Montford Barg, MD [facility] Pager: [email or web address] Password [email or web address] 12/21/2017, 9:33 AM

## 2017-12-22 NOTE — [place] FL2 (Signed)
[place] MEDICAID FL2 LEVEL OF CARE SCREENING TOOL     IDENTIFICATION  Patient Name: Amanda Hall Birthdate: 10/21/1950 Sex: female Admission Date (Current Location): 10/05/2017  [facility] [place]   Facility and Address:  [facility], [address], [place], [place] [address]      Provider Number: [id number]  Attending Physician Name and Address:  Calvert Cantorizwan, Saima, MD  Relative Name and Phone Number:  Sheran SpineSheila Fellner - sister, [phone]    Current Level of Care: Hospital Recommended Level of Care: Skilled Nursing Facility Prior Approval Number:    Date Approved/Denied:   PASRR Number: (PASRR pending - MUST FA#[id number])  Discharge Plan: SNF    Current Diagnoses: Patient Active Problem List   Diagnosis Date Noted  . Pressure injury of skin 11/22/2017  . Anxiety 10/17/2017  . Dysphagia 10/12/2017  . Severe malnutrition (HCC) 10/12/2017  . MDD (major depressive disorder), recurrent severe, without psychosis (HCC)   . Anorexia 10/05/2017  . Uremia   . Protein-calorie malnutrition, severe 08/29/2017  . Generalized anxiety disorder 05/26/2017  . MDD (major depressive disorder) 05/26/2017  . Hypercholesterolemia 03/22/2017  . Nonspecific (abnormal) findings on radiological and other examination of body structure 12/26/2006  . NONSPECIFIC ABNORM FIND RAD&OTH EXAM LUNG FIELD 12/26/2006  . ANEMIA-IRON DEFICIENCY 12/15/2006  . Depression 12/15/2006  . Allergic rhinitis 12/15/2006    Orientation RESPIRATION BLADDER Height & Weight     Self, Place  O2(2 Liters oxygen) Incontinent, External catheter(Cath placed 10/07/17) Weight: 103 lb 6.3 oz (46.9 kg) Height:  5\' 2"  (157.5 cm)  BEHAVIORAL SYMPTOMS/MOOD NEUROLOGICAL BOWEL NUTRITION STATUS      Continent Diet, Feeding tube(DYS 1 diet. Feeding tube placed 11/18)  AMBULATORY STATUS COMMUNICATION OF NEEDS Skin   Extensive Assist Verbally Other (Comment)(Stg 2 pressure injury left ear,  under Peg tube-silicone foam w/ 3x3 dressing-changed 2x wkly & PRN if soiled; Stg 2 pressure injury to medial right buttocks; Unstageable pressure injury to sacrum w/black exchar between pressure injury; )           Patient unable to ambulate with PT            Personal Care Assistance Level of Assistance  Bathing, Feeding, Dressing Bathing Assistance: Maximum assistance Feeding assistance: Limited assistance Dressing Assistance: Maximum assistance     Functional Limitations Info  Sight, Hearing, Speech Sight Info: Adequate Hearing Info: Adequate Speech Info: Impaired(Incomprehensible at times)    SPECIAL CARE FACTORS FREQUENCY  PT (By licensed PT), OT (By licensed OT), Speech therapy     PT Frequency: PT eval 10/8 and re-eval on 12/12 OT Frequency: OT eval 10/9  *PT/OT at SNF Eval and Treat   Speech Therapy Frequency: Evaluation 9/26      Contractures Contractures Info: Not present    Additional Factors Info  Code Status, Allergies Code Status Info: Full Allergies Info: Codeine, Sulfonamide Derivatives, Tetanus Toxoid           Current Medications (12/22/2017):  This is the current hospital active medication list Current Facility-Administered Medications  Medication Dose Route Frequency Provider Last Rate Last Dose  . acetaminophen (TYLENOL) tablet 650 mg  650 mg Oral Q6H PRN Standley BrookingGoodrich, Daniel P, MD   650 mg at 12/16/17 1124   Or  . acetaminophen (TYLENOL) suppository 650 mg  650 mg Rectal Q6H PRN Standley BrookingGoodrich, Daniel P, MD      . chlorhexidine (PERIDEX) 0.12 % solution 15 mL  15 mL Mouth Rinse BID Darlin DropHall, Carole N,  DO   15 mL at 12/22/17 1044  . clonazePAM (KLONOPIN) tablet 0.5 mg  0.5 mg Per Tube TID PRN Tyrone Nine, MD   0.5 mg at 12/22/17 1044  . collagenase (SANTYL) ointment   Topical Daily Hall, Carole N, DO      . enoxaparin (LOVENOX) injection 30 mg  30 mg Subcutaneous Q24H Narda Bonds, MD   30 mg at 12/22/17 1108  . feeding supplement (OSMOLITE 1.5 CAL)  liquid 237 mL  237 mL Per Tube 5 X Daily Noralee Stain, DO   237 mL at 12/22/17 1045  . feeding supplement (PRO-STAT SUGAR FREE 64) liquid 30 mL  30 mL Per Tube Daily Noralee Stain, DO   30 mL at 12/22/17 1059  . ferrous sulfate tablet 325 mg  325 mg Oral Q breakfast Darlin Drop, DO   325 mg at 12/22/17 0831  . fluvoxaMINE (LUVOX) tablet 150 mg  150 mg Per Tube QHS Audrea Muscat T, NP   150 mg at 12/21/17 2202  . free water 350 mL  350 mL Per Tube Q6H Hall, Carole N, DO   350 mL at 12/22/17 1110  . HYDROcodone-acetaminophen (NORCO/VICODIN) 5-325 MG per tablet 1-2 tablet  1-2 tablet Oral Q6H PRN Macon Large, NP   2 tablet at 12/22/17 1044  . labetalol (NORMODYNE,TRANDATE) injection 5 mg  5 mg Intravenous Q2H PRN Jerald Kief, MD   5 mg at 11/04/17 1133  . loperamide (IMODIUM) capsule 2 mg  2 mg Oral PRN Jerald Kief, MD   2 mg at 12/17/17 2107  . magnesium oxide (MAG-OX) tablet 400 mg  400 mg Per Tube BID Regalado, Belkys A, MD   400 mg at 12/22/17 1044  . MEDLINE mouth rinse  15 mL Mouth Rinse q12n4p Dow Adolph N, DO   15 mL at 12/21/17 1825  . megestrol (MEGACE) 400 MG/10ML suspension 400 mg  400 mg Per Tube Daily Blount, Xenia T, NP   400 mg at 12/22/17 1043  . milk and molasses enema  1 enema Rectal Daily PRN Regalado, Belkys A, MD      . mirtazapine (REMERON SOL-TAB) disintegrating tablet 45 mg  45 mg Per Tube QHS Blount, Xenia T, NP   45 mg at 12/21/17 2201  . naphazoline-glycerin (CLEAR EYES REDNESS) ophth solution 2 drop  2 drop Both Eyes QID PRN Standley Brooking, MD      . nutrition supplement (JUVEN) (JUVEN) powder packet 1 packet  1 packet Per Tube BID Noralee Stain, DO   1 packet at 12/22/17 0831  . nystatin (MYCOSTATIN) 100000 UNIT/ML suspension 500,000 Units  5 mL Oral QID Dow Adolph N, DO   500,000 Units at 12/22/17 1043  . OLANZapine zydis (ZYPREXA) disintegrating tablet 20 mg  20 mg Sublingual QHS Hall, Carole N, DO   20 mg at 12/21/17 2202  . ondansetron  (ZOFRAN) tablet 4 mg  4 mg Per Tube Q6H PRN Blount, Andi Devon T, NP       Or  . ondansetron (ZOFRAN) injection 4 mg  4 mg Intravenous Q6H PRN Audrea Muscat T, NP   4 mg at 11/15/17 0457  . phosphorus (K PHOS NEUTRAL) tablet 500 mg  500 mg Oral TID WC Tyrone Nine, MD   500 mg at 12/22/17 0831  . sennosides (SENOKOT) 8.8 MG/5ML syrup 5 mL  5 mL Per Tube QHS Blount, Xenia T, NP   5 mL at 12/20/17 2140  . simvastatin (ZOCOR)  tablet 10 mg  10 mg Per Tube q1800 Audrea Muscat T, NP   10 mg at 12/21/17 1824  . sodium chloride flush (NS) 0.9 % injection 3 mL  3 mL Intravenous Q12H Standley Brooking, MD   3 mL at 12/21/17 2203  . thiamine (VITAMIN B-1) tablet 100 mg  100 mg Oral Daily Tyrone Nine, MD   100 mg at 12/22/17 1044  . vitamin C (ASCORBIC ACID) tablet 250 mg  250 mg Per Tube BID Tyrone Nine, MD   250 mg at 12/22/17 1044     Discharge Medications: Please see discharge summary for a list of discharge medications.  Relevant Imaging Results:  Relevant Lab Results:   Additional Information ss#[id number].  Wounds continued: Deep tissue injury to right heel; Stage 1 pressure injury to left heel; Stage 2 pressure injury to lower medial abdomen; Ecchymosis riight arm; Abrasion right knee; MASD right/left perineum, treated with barrier cream; Prevalon boots RLE & LLE  Okey Dupre Lazaro Arms, LCSW

## 2017-12-22 NOTE — Evaluation (Signed)
Physical Therapy Evaluation Patient Details Name: Amanda Hall MRN: [id number] DOB: 1950/04/04 Today's Date: 12/22/2017   History of Present Illness  Pt is a 67 y/o female admitted secondary to major depressive disorder. Pt with increased confusion and agitation prior to admission. Found to have AKI and acute encephalopathy. CT negative for acute abnormality. PT was treating pt but signed off on 11/8 due to 3 refusals and no progression.  PMH includes anxiety and depression.   Clinical Impression  Pt admitted with above diagnosis. Pt currently with functional limitations due to the deficits listed below (see PT Problem List). Pt extremely deconditioned and needed mod to max  assist to perform exercises.  Pt cries out but does attempt to assist PT.  Will give trial of PT to determine if pt can make progress.  Will need SNF as pt cannot care for herself at this level.  Pt will benefit from skilled PT to increase their independence and safety with mobility to allow discharge to the venue listed below.      Follow Up Recommendations Supervision/Assistance - 24 hour;SNF    Equipment Recommendations  None recommended by PT    Recommendations for Other Services       Precautions / Restrictions Precautions Precautions: Fall Restrictions Weight Bearing Restrictions: No      Mobility  Bed Mobility               General bed mobility comments: did not test as pt was following commands inconsistently with UE and LE exercises.  Pt minimally participatory.   Transfers                    Ambulation/Gait                Stairs            Wheelchair Mobility    Modified Rankin (Stroke Patients Only)       Balance                                             Pertinent Vitals/Pain Pain Assessment: Faces Faces Pain Scale: Hurts worst Pain Location: all 4 extremities Pain Descriptors / Indicators: Grimacing;Moaning Pain Intervention(s):  Limited activity within patient's tolerance;Monitored during session;Repositioned;Premedicated before session    Home Living Family/patient expects to be discharged to:: Unsure Living Arrangements: Other relatives               Additional Comments: pt reports walker in the home, never used herself and does not want tofrom previous eval    Prior Function Level of Independence: Needs assistance   Gait / Transfers Assistance Needed: Reports she needed assist with walking and sometimes used RW.   ADL's / Homemaking Assistance Needed: pt unable to report        Hand Dominance   Dominant Hand: Right    Extremity/Trunk Assessment   Upper Extremity Assessment Upper Extremity Assessment: RUE deficits/detail;LUE deficits/detail RUE Deficits / Details: pain with movement with pt keeping UEs flexed.  Could not stretch to full range due to pt tone/resistance.  Pt needed assist to raise shoulder to 50 degrees flexion LUE Deficits / Details: pain with movement with pt keeping UEs flexed.  Could not stretch to full range due to pt tone/resistance.  Pt needed assist to raise shoulder to 30 degrees flexion     Lower Extremity Assessment Lower  Extremity Assessment: RLE deficits/detail;LLE deficits/detail RLE Deficits / Details: Pt would bend LE to command but then incr tone and needed assist to straigten with tone/resistance noted. Would not move ankles.  LLE Deficits / Details: Pt would bend LE to command but then incr tone and needed assist to straigten with tone/resistance noted. Would not move ankles.        Communication   Communication: Other (comment)(Pt only cried out with UE and LE movement)  Cognition Arousal/Alertness: Awake/alert Behavior During Therapy: Anxious;Flat affect Overall Cognitive Status: No family/caregiver present to determine baseline cognitive functioning                                 General Comments: Pt crying out to movement.  Did not answer  any questions.  Did say " That hurts," when moving UE and LES.       General Comments      Exercises General Exercises - Upper Extremity Shoulder Flexion: AAROM;Both;5 reps;Supine Shoulder Horizontal ADduction: AAROM;Both;10 reps;Supine Elbow Flexion: AAROM;Both;5 reps;Supine Elbow Extension: AAROM;Both;10 reps;Supine General Exercises - Lower Extremity Ankle Circles/Pumps: AROM;Both;10 reps Heel Slides: AAROM;Both;5 reps;Supine Hip ABduction/ADduction: AAROM;Both;5 reps;Supine   Assessment/Plan    PT Assessment Patient needs continued PT services  PT Problem List Decreased strength;Decreased balance;Decreased activity tolerance;Decreased mobility;Decreased knowledge of use of DME;Decreased knowledge of precautions;Decreased safety awareness;Decreased range of motion       PT Treatment Interventions DME instruction;Functional mobility training;Therapeutic activities;Therapeutic exercise;Balance training;Patient/family education    PT Goals (Current goals can be found in the Care Plan section)  Acute Rehab PT Goals Patient Stated Goal: did not state PT Goal Formulation: Patient unable to participate in goal setting Time For Goal Achievement: 01/05/18 Potential to Achieve Goals: Fair    Frequency Min 1X/week   Barriers to discharge        Co-evaluation               AM-PAC PT "6 Clicks" Mobility  Outcome Measure Help needed turning from your back to your side while in a flat bed without using bedrails?: Total Help needed moving from lying on your back to sitting on the side of a flat bed without using bedrails?: Total Help needed moving to and from a bed to a chair (including a wheelchair)?: Total Help needed standing up from a chair using your arms (e.g., wheelchair or bedside chair)?: Total Help needed to walk in hospital room?: Total Help needed climbing 3-5 steps with a railing? : Total 6 Click Score: 6    End of Session   Activity Tolerance: Patient  limited by pain Patient left: in bed;with call bell/phone within reach Nurse Communication: Mobility status;Need for lift equipment PT Visit Diagnosis: Unsteadiness on feet (R26.81);Muscle weakness (generalized) (M62.81);Pain Pain - part of body: (generalized)    Time: 1218-1229 PT Time Calculation (min) (ACUTE ONLY): 11 min   Charges:   PT Evaluation $PT Eval Moderate Complexity: 1 Mod          Amanda Hall,PT Acute Rehabilitation Services Pager:  [phone]  Office:  [phone]    Berline LopesDawn F Gladine Hall 12/22/2017, 2:09 PM

## 2017-12-22 NOTE — Progress Notes (Addendum)
PROGRESS NOTE    Amanda BreezeDana Ericksen   EAV:[id number]  DOB: 14-Aug-1950  DOA: 10/05/2017 PCP: Gordy SaversKwiatkowski, Peter F, MD   Brief Narrative:  Amanda Hall is a 67 yo femalewith medical history significant for depression with anxiety and recent inpatient psychiatry admission, now presenting to the emergency department for evaluation of increased confusion, agitation, refusal to take her medications, and not eating or drinking.  Patient was admitted to inpatient psychiatry approximately 1 month ago and has been living with her sister since discharge.  She has reportedly been refusing her medications, not eating or drinking, and becoming increasingly confused and agitated.   She has been seen by psychiatry who has recommended inpatient psych placement however has been unable to get a bed. Due to her poor oral intake, she has received a PEG tube and is essentially bed bound and not cooperating with PT. Psych has been treating her with oral medications. Re-evaluated by psych 12/9, it was felt that she may receive treatment for her depression faster is she was not awaiting a psych facility and was able to be discharged. She needs SNF and is difficult to place.    Subjective: Tells me she feels anxious.  Assessment & Plan:   Principal Problem: Severe anxiety and depression -  Cont Zyprexa ,Luvox , Remeron,  Klonopin 0.5 mg TID PRN - per psych, she will likely receive faster treatment if she was to be discharged and now recommending outpt psych with DR Neila GearVincent Izediuno - will further discuss plan with psych today  Active Problems:     Dysphagia, very poor oral intake,  Severe malnutrition - s/p PEG during this hospital stay   Acute kidney injury -Likely due to dehydration, anorexia, severe protein calorie malnutrition, resolved  Iron deficiency anemia -Continue iron supplementation  Pressure injury of skin -Stage II, partial-thickness, left buttocks, not present on admission -Wound care per nursing   Cardiac amyloidosis? - Suspected on Transthoracic Echocardiogram. -  Will investigate further  DVT prophylaxis: Lovenox Code Status: Full code Disposition Plan: SW working on this- she has no one to care for her  Consultants:   psych Procedures:   PEG  2 D ECHO 11/18 Impressions:  - Echocardiogram strong suggestive of cardiac amyloidosis. Presence   of a pericardial effusion, thickened valves, thickened atrial   septum, and concentric myocardial thickening, as well as RV wall   thickening. Normal LVEF 65-70%, no regional wall motion   abnormalities. Moderate aortic valve regurgitation. Possible   dilation of the sinuses of Valsalva. Severely thickened mitral   valve leaflets with trivial regurgitation. Moderate LA   enlargement. Moderate pericardial effusion without current   features of tamponade physiology.  Recommendations:  If clinically indicated: 1) consider evaluation of cardiac amyloidosis with serum and urine protein electrophoresis with immunofixation, cardiac MRI and/or nuclear medicine PYP study. 2) consider cardiac CT or cardiac MR to better evaluate aortic sinus of Valsalva possible dilation. Antimicrobials:  Anti-infectives (From admission, onward)   Start     Dose/Rate Route Frequency Ordered Stop   11/28/17 0915  ceFAZolin (ANCEF) IVPB 1 g/50 mL premix     1 g 100 mL/hr over 30 Minutes Intravenous To Radiology 11/28/17 0907 11/29/17 0915   11/28/17 0847  ceFAZolin (ANCEF) 2-4 GM/100ML-% IVPB    Note to Pharmacy:  Domenica FailKlesch, [name]   : cabinet override      11/28/17 0847 11/28/17 1046   11/28/17 0845  ceFAZolin (ANCEF) powder 1 g  Status:  Discontinued     1  g Other To Surgery 11/28/17 0830 11/28/17 0907   10/23/17 0830  cefTRIAXone (ROCEPHIN) 1 g in sodium chloride 0.9 % 100 mL IVPB  Status:  Discontinued     1 g 200 mL/hr over 30 Minutes Intravenous Daily 10/23/17 0721 10/26/17 1400       Objective: Vitals:   12/21/17 1700 12/21/17 2159 12/22/17  0636 12/22/17 0931  BP: (!) 122/58 (!) 143/57 (!) 130/53 118/63  Pulse: (!) 110 (!) 124 (!) 150 (!) 120  Resp: 18 19 16 18   Temp: 98 F (36.7 C) 97.9 F (36.6 C) 98 F (36.7 C) 97.9 F (36.6 C)  TempSrc: Axillary Oral Oral Oral  SpO2: 100% 99% (!) 89% 94%  Weight:      Height:        Intake/Output Summary (Last 24 hours) at 12/22/2017 1226 Last data filed at 12/22/2017 0900 Gross per 24 hour  Intake 942 ml  Output 1350 ml  Net -408 ml   Filed Weights   12/18/17 0300 12/18/17 2210 12/19/17 0500  Weight: 46.2 kg 46.9 kg 46.9 kg    Examination: General exam: Appears comfortable - cachexia  Respiratory system: Clear to auscultation. Respiratory effort normal. Cardiovascular system: S1 & S2 heard, RRR.   Gastrointestinal system: Abdomen soft, non-tender, nondistended. Normal bowel sounds. Central nervous system: Alert    Extremities: No cyanosis, clubbing or edema Psych: appears very anxious   Data Reviewed: I have personally reviewed following labs and imaging studies  CBC: Recent Labs  Lab 12/16/17 1155  WBC 8.0  HGB 10.5*  HCT 32.5*  MCV 95.0  PLT 389   Basic Metabolic Panel: Recent Labs  Lab 12/16/17 1155  NA 143  K 4.2  CL 104  CO2 28  GLUCOSE 163*  BUN 40*  CREATININE 0.74  CALCIUM 9.8  MG 2.2  PHOS 2.3*   GFR: Estimated Creatinine Clearance: 51.2 mL/min (by C-G formula based on SCr of 0.74 mg/dL). Liver Function Tests: Recent Labs  Lab 12/16/17 1155  AST 17  ALT 13  ALKPHOS 69  BILITOT 0.3  PROT 6.2*  ALBUMIN 2.7*   No results for input(s): LIPASE, AMYLASE in the last 168 hours. No results for input(s): AMMONIA in the last 168 hours. Coagulation Profile: No results for input(s): INR, PROTIME in the last 168 hours. Cardiac Enzymes: No results for input(s): CKTOTAL, CKMB, CKMBINDEX, TROPONINI in the last 168 hours. BNP (last 3 results) No results for input(s): PROBNP in the last 8760 hours. HbA1C: No results for input(s): HGBA1C in  the last 72 hours. CBG: Recent Labs  Lab 12/16/17 0149 12/16/17 0749 12/16/17 1108  GLUCAP 121* 146* 111*   Lipid Profile: No results for input(s): CHOL, HDL, LDLCALC, TRIG, CHOLHDL, LDLDIRECT in the last 72 hours. Thyroid Function Tests: No results for input(s): TSH, T4TOTAL, FREET4, T3FREE, THYROIDAB in the last 72 hours. Anemia Panel: No results for input(s): VITAMINB12, FOLATE, FERRITIN, TIBC, IRON, RETICCTPCT in the last 72 hours. Urine analysis:    Component Value Date/Time   COLORURINE AMBER (A) 10/23/2017 0106   APPEARANCEUR CLOUDY (A) 10/23/2017 0106   LABSPEC 1.009 10/23/2017 0106   PHURINE 9.0 (H) 10/23/2017 0106   GLUCOSEU NEGATIVE 10/23/2017 0106   HGBUR MODERATE (A) 10/23/2017 0106   BILIRUBINUR NEGATIVE 10/23/2017 0106   KETONESUR NEGATIVE 10/23/2017 0106   PROTEINUR NEGATIVE 10/23/2017 0106   UROBILINOGEN 0.2 12/15/2006 1654   NITRITE NEGATIVE 10/23/2017 0106   LEUKOCYTESUR MODERATE (A) 10/23/2017 0106   Sepsis Labs: @LABRCNTIP (procalcitonin:4,lacticidven:4) )No results found  for this or any previous visit (from the past 240 hour(s)).       Radiology Studies: No results found.    Scheduled Meds: . chlorhexidine  15 mL Mouth Rinse BID  . collagenase   Topical Daily  . enoxaparin (LOVENOX) injection  30 mg Subcutaneous Q24H  . feeding supplement (OSMOLITE 1.5 CAL)  237 mL Per Tube 5 X Daily  . feeding supplement (PRO-STAT SUGAR FREE 64)  30 mL Per Tube Daily  . ferrous sulfate  325 mg Oral Q breakfast  . fluvoxaMINE  150 mg Per Tube QHS  . free water  350 mL Per Tube Q6H  . magnesium oxide  400 mg Per Tube BID  . mouth rinse  15 mL Mouth Rinse q12n4p  . megestrol  400 mg Per Tube Daily  . mirtazapine  45 mg Per Tube QHS  . nutrition supplement (JUVEN)  1 packet Per Tube BID  . nystatin  5 mL Oral QID  . OLANZapine zydis  20 mg Sublingual QHS  . phosphorus  500 mg Oral TID WC  . sennosides  5 mL Per Tube QHS  . simvastatin  10 mg Per Tube  q1800  . sodium chloride flush  3 mL Intravenous Q12H  . thiamine  100 mg Oral Daily  . vitamin C  250 mg Per Tube BID   Continuous Infusions:   LOS: 78 days    Time spent in minutes: 40 min Extensive chart review performed today as she has been hospitalized > 1 month and is a new patient to me    Calvert Cantor, MD [facility] Pager: [email or web address] Password [facility] 12/22/2017, 12:26 PM

## 2017-12-22 NOTE — Clinical Social Work Note (Signed)
Skilled facility search initiated today for ST rehab. CSW talked with financial counselor this morning regarding financial counseling's assistance with a Medicaid application and CSW advised that sister will be contacted. CSW visited with patient this afternoon and provided a brief update of efforts to find appropriate placement for her. Ms. Amanda Hall was awake and alert, but found it difficult to verbalize her feelings. At one point she advised this CSW that she was having problems getting her thoughts together and during the visit patient expressed, "I'm scared" and "I'm scared to death" and CSW listened empathically and validated patient's feelings. Ms. Amanda Hall was advised that her sister has been consulted regarding her discharge plan and she will be kept informed. CSW will continue to provide SW intervention services and efforts to locate placement for patient.   Genelle BalVanessa Ashleigh Arya, MSW, LCSW Licensed Clinical Social Worker Clinical Social Work Department [facility] [phone]

## 2017-12-23 LAB — CBC
HCT: 27.9 % — ABNORMAL LOW (ref 36.0–46.0)
Hemoglobin: 8.8 g/dL — ABNORMAL LOW (ref 12.0–15.0)
MCH: 29.9 pg (ref 26.0–34.0)
MCHC: 31.5 g/dL (ref 30.0–36.0)
MCV: 94.9 fL (ref 80.0–100.0)
PLATELETS: 320 10*3/uL (ref 150–400)
RBC: 2.94 MIL/uL — ABNORMAL LOW (ref 3.87–5.11)
RDW: 14 % (ref 11.5–15.5)
WBC: 12.6 10*3/uL — ABNORMAL HIGH (ref 4.0–10.5)
nRBC: 0 % (ref 0.0–0.2)

## 2017-12-23 LAB — BASIC METABOLIC PANEL
Anion gap: 12 (ref 5–15)
BUN: 58 mg/dL — ABNORMAL HIGH (ref 8–23)
CALCIUM: 9.2 mg/dL (ref 8.9–10.3)
CO2: 31 mmol/L (ref 22–32)
CREATININE: 0.54 mg/dL (ref 0.44–1.00)
Chloride: 97 mmol/L — ABNORMAL LOW (ref 98–111)
GFR calc Af Amer: >60 mL/min (ref >60)
GFR calc non Af Amer: >60 mL/min (ref >60)
Glucose, Bld: 121 mg/dL — ABNORMAL HIGH (ref 70–99)
Potassium: 2.7 mmol/L — CL (ref 3.5–5.1)
Sodium: 140 mmol/L (ref 135–145)

## 2017-12-23 LAB — PHOSPHORUS: Phosphorus: 2.4 mg/dL — ABNORMAL LOW (ref 2.5–4.6)

## 2017-12-23 LAB — MAGNESIUM: Magnesium: 2 mg/dL (ref 1.7–2.4)

## 2017-12-23 MED ORDER — POTASSIUM CHLORIDE 20 MEQ/15ML (10%) PO SOLN
40.0000 meq | ORAL | Status: AC
  Administered 2017-12-23 (×2): 40 meq via ORAL
  Filled 2017-12-23 (×2): qty 30

## 2017-12-23 NOTE — Progress Notes (Signed)
Patient was more lethargic/ confused this morning. MD made aware.  Patient was assisted to sit edge of bed for approximately 2 min with support.

## 2017-12-23 NOTE — Clinical Social Work Note (Signed)
CSW working on placment for patient and [facility] for [facility]hab once PASRR number received. Clinicals faxed to [facility] earlier today and she is currently under review by [facility]. CSW will continue to follow and facilitate discharge [facility] once PASRR number received.   Genelle BalVanessa Jaelon Gatley, MSW, LCSW Licensed Clinical Social Worker Clinical Social Work Department [facility] [phone]

## 2017-12-23 NOTE — Progress Notes (Signed)
PROGRESS NOTE    Amanda Hall   ZOX:[id number]  DOB: 10-26-1950  DOA: 10/05/2017 PCP: [name], MD   Brief Narrative:  Amanda Hall is a 67 yo femalewith medical history significant for depression with anxiety and recent inpatient psychiatry admission, now presenting to the emergency department for evaluation of increased confusion, agitation, refusal to take her medications, and not eating or drinking.  Patient was admitted to inpatient psychiatry approximately 1 month ago and has been living with her sister since discharge.  She has reportedly been refusing her medications, not eating or drinking, and becoming increasingly confused and agitated.   She has been seen by psychiatry who has recommended inpatient psych placement however has been unable to get a bed. Due to her poor oral intake, she has received a PEG tube and is essentially bed bound and not cooperating with PT. Psych has been treating her with oral medications. Re-evaluated by psych 12/9, it was felt that she may receive treatment for her depression faster is she was not awaiting a psych facility and was able to be discharged. She needs SNF and is difficult to place.    Subjective: Asleep today.   Assessment & Plan:   Principal Problem: Severe anxiety and depression -  Cont Zyprexa ,Luvox , Remeron,  Klonopin 0.5 mg TID PRN - per psych, she will likely receive faster treatment if she was to be discharged and now recommending outpt psych with DR Neila Gear   Active Problems:     Dysphagia, very poor oral intake,  Severe malnutrition - s/p PEG during this hospital stay  Hypokalemia - K 2.7 - replacing- cont K phos and add additional K   Acute kidney injury -Likely due to dehydration, anorexia, severe protein calorie malnutrition, resolved  Iron deficiency anemia -Continue iron supplementation  Pressure injury of skin -Stage II, partial-thickness, left buttocks, not present on admission -Wound care  per nursing   Cardiac amyloidosis? - Suspected on Transthoracic Echocardiogram. -  Will investigate further  DVT prophylaxis: Lovenox Code Status: Full code Disposition Plan: SW working on SNF bed Consultants:   psych Procedures:   PEG  2 D ECHO 11/18 Impressions:  - Echocardiogram strong suggestive of cardiac amyloidosis. Presence   of a pericardial effusion, thickened valves, thickened atrial   septum, and concentric myocardial thickening, as well as RV wall   thickening. Normal LVEF 65-70%, no regional wall motion   abnormalities. Moderate aortic valve regurgitation. Possible   dilation of the sinuses of Valsalva. Severely thickened mitral   valve leaflets with trivial regurgitation. Moderate LA   enlargement. Moderate pericardial effusion without current   features of tamponade physiology.  Recommendations:  If clinically indicated: 1) consider evaluation of cardiac amyloidosis with serum and urine protein electrophoresis with immunofixation, cardiac MRI and/or nuclear medicine PYP study. 2) consider cardiac CT or cardiac MR to better evaluate aortic sinus of Valsalva possible dilation. Antimicrobials:  Anti-infectives (From admission, onward)   Start     Dose/Rate Route Frequency Ordered Stop   11/28/17 0915  ceFAZolin (ANCEF) IVPB 1 g/50 mL premix     1 g 100 mL/hr over 30 Minutes Intravenous To Radiology 11/28/17 0907 11/29/17 0915   11/28/17 0847  ceFAZolin (ANCEF) 2-4 GM/100ML-% IVPB    Note to Pharmacy:  Domenica Fail   : cabinet override      11/28/17 0847 11/28/17 1046   11/28/17 0845  ceFAZolin (ANCEF) powder 1 g  Status:  Discontinued     1 g Other  To Surgery 11/28/17 0830 11/28/17 0907   10/23/17 0830  cefTRIAXone (ROCEPHIN) 1 g in sodium chloride 0.9 % 100 mL IVPB  Status:  Discontinued     1 g 200 mL/hr over 30 Minutes Intravenous Daily 10/23/17 0721 10/26/17 1400       Objective: Vitals:   12/22/17 0931 12/22/17 1709 12/22/17 2125 12/23/17  0442  BP: 118/63 (!) 102/44 (!) 102/52 124/66  Pulse: (!) 120 (!) 129 (!) 112 (!) 119  Resp: 18 18 16    Temp: 97.9 F (36.6 C) 98.9 F (37.2 C) 98.4 F (36.9 C) 98.4 F (36.9 C)  TempSrc: Oral Oral    SpO2: 94% (!) 87% 95% 93%  Weight:      Height:        Intake/Output Summary (Last 24 hours) at 12/23/2017 1245 Last data filed at 12/23/2017 0442 Gross per 24 hour  Intake 237 ml  Output 150 ml  Net 87 ml   Filed Weights   12/18/17 0300 12/18/17 2210 12/19/17 0500  Weight: 46.2 kg 46.9 kg 46.9 kg    Examination: General exam: Appears comfortable - severe cachexia HEENT: PERRLA, oral mucosa moist, no sclera icterus or thrush Respiratory system: Clear to auscultation. Respiratory effort normal. Cardiovascular system: S1 & S2 heard,  No murmurs  Gastrointestinal system: Abdomen soft, non-tender, nondistended. Normal bowel sound. No organomegaly- PEG present Extremities: No cyanosis, clubbing or edema Skin: No rashes or ulcers     Data Reviewed: I have personally reviewed following labs and imaging studies  CBC: Recent Labs  Lab 12/23/17 0440  WBC 12.6*  HGB 8.8*  HCT 27.9*  MCV 94.9  PLT 320   Basic Metabolic Panel: Recent Labs  Lab 12/23/17 0440  NA 140  K 2.7*  CL 97*  CO2 31  GLUCOSE 121*  BUN 58*  CREATININE 0.54  CALCIUM 9.2  MG 2.0  PHOS 2.4*   GFR: Estimated Creatinine Clearance: 51.2 mL/min (by C-G formula based on SCr of 0.54 mg/dL). Liver Function Tests: No results for input(s): AST, ALT, ALKPHOS, BILITOT, PROT, ALBUMIN in the last 168 hours. No results for input(s): LIPASE, AMYLASE in the last 168 hours. No results for input(s): AMMONIA in the last 168 hours. Coagulation Profile: No results for input(s): INR, PROTIME in the last 168 hours. Cardiac Enzymes: No results for input(s): CKTOTAL, CKMB, CKMBINDEX, TROPONINI in the last 168 hours. BNP (last 3 results) No results for input(s): PROBNP in the last 8760 hours. HbA1C: No results  for input(s): HGBA1C in the last 72 hours. CBG: No results for input(s): GLUCAP in the last 168 hours. Lipid Profile: No results for input(s): CHOL, HDL, LDLCALC, TRIG, CHOLHDL, LDLDIRECT in the last 72 hours. Thyroid Function Tests: No results for input(s): TSH, T4TOTAL, FREET4, T3FREE, THYROIDAB in the last 72 hours. Anemia Panel: No results for input(s): VITAMINB12, FOLATE, FERRITIN, TIBC, IRON, RETICCTPCT in the last 72 hours. Urine analysis:    Component Value Date/Time   COLORURINE AMBER (A) 10/23/2017 0106   APPEARANCEUR CLOUDY (A) 10/23/2017 0106   LABSPEC 1.009 10/23/2017 0106   PHURINE 9.0 (H) 10/23/2017 0106   GLUCOSEU NEGATIVE 10/23/2017 0106   HGBUR MODERATE (A) 10/23/2017 0106   BILIRUBINUR NEGATIVE 10/23/2017 0106   KETONESUR NEGATIVE 10/23/2017 0106   PROTEINUR NEGATIVE 10/23/2017 0106   UROBILINOGEN 0.2 12/15/2006 1654   NITRITE NEGATIVE 10/23/2017 0106   LEUKOCYTESUR MODERATE (A) 10/23/2017 0106   Sepsis Labs: @LABRCNTIP (procalcitonin:4,lacticidven:4) )No results found for this or any previous visit (from the past 240  hour(s)).       Radiology Studies: No results found.    Scheduled Meds: . chlorhexidine  15 mL Mouth Rinse BID  . collagenase   Topical Daily  . enoxaparin (LOVENOX) injection  30 mg Subcutaneous Q24H  . feeding supplement (OSMOLITE 1.5 CAL)  237 mL Per Tube 5 X Daily  . feeding supplement (PRO-STAT SUGAR FREE 64)  30 mL Per Tube Daily  . ferrous sulfate  325 mg Oral Q breakfast  . fluvoxaMINE  150 mg Per Tube QHS  . free water  350 mL Per Tube Q6H  . magnesium oxide  400 mg Per Tube BID  . mouth rinse  15 mL Mouth Rinse q12n4p  . megestrol  400 mg Per Tube Daily  . mirtazapine  45 mg Per Tube QHS  . nutrition supplement (JUVEN)  1 packet Per Tube BID  . OLANZapine zydis  20 mg Sublingual QHS  . phosphorus  500 mg Oral TID WC  . potassium chloride  40 mEq Oral Q4H  . sennosides  5 mL Per Tube QHS  . simvastatin  10 mg Per Tube  q1800  . sodium chloride flush  3 mL Intravenous Q12H  . thiamine  100 mg Oral Daily  . vitamin C  250 mg Per Tube BID   Continuous Infusions:   LOS: 79 days    Time spent in minutes: 40 min Extensive chart review performed today as she has been hospitalized > 1 month and is a new patient to me    Calvert CantorSaima Tykee Heideman, MD [facility] Pager: [email or web address] Password [email or web address] 12/23/2017, 12:45 PM

## 2017-12-24 LAB — BASIC METABOLIC PANEL
ANION GAP: 13 (ref 5–15)
BUN: 62 mg/dL — ABNORMAL HIGH (ref 8–23)
CHLORIDE: 102 mmol/L (ref 98–111)
CO2: 26 mmol/L (ref 22–32)
Calcium: 9.5 mg/dL (ref 8.9–10.3)
Creatinine, Ser: 0.62 mg/dL (ref 0.44–1.00)
GFR calc Af Amer: >60 mL/min (ref >60)
GFR calc non Af Amer: >60 mL/min (ref >60)
Glucose, Bld: 109 mg/dL — ABNORMAL HIGH (ref 70–99)
Potassium: 3.9 mmol/L (ref 3.5–5.1)
Sodium: 141 mmol/L (ref 135–145)

## 2017-12-24 MED ORDER — POTASSIUM CHLORIDE 20 MEQ/15ML (10%) PO SOLN
40.0000 meq | Freq: Once | ORAL | Status: AC
  Administered 2017-12-24: 40 meq via ORAL
  Filled 2017-12-24: qty 30

## 2017-12-24 NOTE — Progress Notes (Addendum)
PROGRESS NOTE    Amanda Hall   ION:[id number]  DOB: [birth year]  DOA: 10/05/2017 PCP: Gordy SaversKwiatkowski, Peter F, MD   Brief Narrative:  Amanda BreezeDana Hall is a 67 yo femalewith medical history significant for depression with anxiety and recent inpatient psychiatry admission, now presenting to the emergency department for evaluation of increased confusion, agitation, refusal to take her medications, and not eating or drinking.  Patient was admitted to inpatient psychiatry approximately 1 month ago and has been living with her sister since discharge.  She has reportedly been refusing her medications, not eating or drinking, and becoming increasingly confused and agitated.   She has been seen by psychiatry who has recommended inpatient psych placement however has been unable to get a bed. Due to her poor oral intake, she has received a PEG tube and is essentially bed bound and not cooperating with PT. Psych has been treating her with oral medications. Re-evaluated by psych 12/9, it was felt that she may receive treatment for her depression faster is she was not awaiting a psych facility and was able to be discharged. She needs SNF and is difficult to place.    Subjective: Awake. She states she has no complaints today.  Assessment & Plan:   Principal Problem: Severe anxiety and depression -  Cont Zyprexa ,Luvox , Remeron,  Klonopin 0.5 mg TID PRN - per psych, she will likely receive faster treatment if she was to be discharged and now recommending outpt psych with Dr Neila GearVincent Izediuno - we are awaiting SNF placement   Active Problems:     Dysphagia, very poor oral intake,  Severe malnutrition - s/p PEG during this hospital stay  Hypokalemia, hypophosphatemia -likely re-feeding syndrome - replacing- cont K phos and add additional K as needed   Acute kidney injury -Likely due to dehydration, anorexia, severe protein calorie malnutrition, resolved  Iron deficiency anemia -Continue iron  supplementation  Pressure injury of skin -Stage II, partial-thickness, left buttocks, not present on admission -Wound care per nursing   Cardiac amyloidosis? - Suspected on Transthoracic Echocardiogram. - no heart failure noted- currently her psych issues and overall malnutrition and bed bound state are more imminent issues- if she is to recuperate from these, further cardiac work up can be obtained   DVT prophylaxis: Lovenox Code Status: Full code Disposition Plan: SW working on SNF bed Consultants:   psych Procedures:   PEG  2 D ECHO 11/18 Impressions:  - Echocardiogram strong suggestive of cardiac amyloidosis. Presence   of a pericardial effusion, thickened valves, thickened atrial   septum, and concentric myocardial thickening, as well as RV wall   thickening. Normal LVEF 65-70%, no regional wall motion   abnormalities. Moderate aortic valve regurgitation. Possible   dilation of the sinuses of Valsalva. Severely thickened mitral   valve leaflets with trivial regurgitation. Moderate LA   enlargement. Moderate pericardial effusion without current   features of tamponade physiology.  Recommendations:  If clinically indicated: 1) consider evaluation of cardiac amyloidosis with serum and urine protein electrophoresis with immunofixation, cardiac MRI and/or nuclear medicine PYP study. 2) consider cardiac CT or cardiac MR to better evaluate aortic sinus of Valsalva possible dilation. Antimicrobials:  Anti-infectives (From admission, onward)   Start     Dose/Rate Route Frequency Ordered Stop   11/28/17 0915  ceFAZolin (ANCEF) IVPB 1 g/50 mL premix     1 g 100 mL/hr over 30 Minutes Intravenous To Radiology 11/28/17 0907 11/29/17 0915   11/28/17 0847  ceFAZolin (ANCEF) 2-4 GM/100ML-% IVPB  Note to Pharmacy:  Domenica Fail   : cabinet override      11/28/17 0847 11/28/17 1046   11/28/17 0845  ceFAZolin (ANCEF) powder 1 g  Status:  Discontinued     1 g Other To Surgery  11/28/17 0830 11/28/17 0907   10/23/17 0830  cefTRIAXone (ROCEPHIN) 1 g in sodium chloride 0.9 % 100 mL IVPB  Status:  Discontinued     1 g 200 mL/hr over 30 Minutes Intravenous Daily 10/23/17 0721 10/26/17 1400       Objective: Vitals:   12/23/17 1759 12/23/17 2147 12/24/17 0339 12/24/17 0914  BP: 110/66 112/69 (!) 143/89 128/82  Pulse: (!) 115 (!) 106 (!) 116 (!) 124  Resp:  20 14 18   Temp: 98.3 F (36.8 C) 98.6 F (37 C) 98.2 F (36.8 C) 98.9 F (37.2 C)  TempSrc: Skin Oral Oral Oral  SpO2: 95% 100% 97% 96%  Weight:  45.5 kg    Height:        Intake/Output Summary (Last 24 hours) at 12/24/2017 1317 Last data filed at 12/24/2017 1100 Gross per 24 hour  Intake 1174 ml  Output 350 ml  Net 824 ml   Filed Weights   12/18/17 2210 12/19/17 0500 12/23/17 2147  Weight: 46.9 kg 46.9 kg 45.5 kg    Examination: General exam: Appears comfortable - cachectic Respiratory system: Clear to auscultation. Respiratory effort normal. Cardiovascular system: S1 & S2 heard,  No murmurs  Gastrointestinal system: Abdomen soft, non-tender, nondistended. Normal bowel sound. No organomegaly Extremities: No cyanosis, clubbing or edema Skin: No rashes or ulcers Psychiatry: anxious appearing     Data Reviewed: I have personally reviewed following labs and imaging studies  CBC: Recent Labs  Lab 12/23/17 0440  WBC 12.6*  HGB 8.8*  HCT 27.9*  MCV 94.9  PLT 320   Basic Metabolic Panel: Recent Labs  Lab 12/23/17 0440 12/24/17 0423  NA 140 141  K 2.7* 3.9  CL 97* 102  CO2 31 26  GLUCOSE 121* 109*  BUN 58* 62*  CREATININE 0.54 0.62  CALCIUM 9.2 9.5  MG 2.0  --   PHOS 2.4*  --    GFR: Estimated Creatinine Clearance: 49.7 mL/min (by C-G formula based on SCr of 0.62 mg/dL). Liver Function Tests: No results for input(s): AST, ALT, ALKPHOS, BILITOT, PROT, ALBUMIN in the last 168 hours. No results for input(s): LIPASE, AMYLASE in the last 168 hours. No results for input(s):  AMMONIA in the last 168 hours. Coagulation Profile: No results for input(s): INR, PROTIME in the last 168 hours. Cardiac Enzymes: No results for input(s): CKTOTAL, CKMB, CKMBINDEX, TROPONINI in the last 168 hours. BNP (last 3 results) No results for input(s): PROBNP in the last 8760 hours. HbA1C: No results for input(s): HGBA1C in the last 72 hours. CBG: No results for input(s): GLUCAP in the last 168 hours. Lipid Profile: No results for input(s): CHOL, HDL, LDLCALC, TRIG, CHOLHDL, LDLDIRECT in the last 72 hours. Thyroid Function Tests: No results for input(s): TSH, T4TOTAL, FREET4, T3FREE, THYROIDAB in the last 72 hours. Anemia Panel: No results for input(s): VITAMINB12, FOLATE, FERRITIN, TIBC, IRON, RETICCTPCT in the last 72 hours. Urine analysis:    Component Value Date/Time   COLORURINE AMBER (A) 10/23/2017 0106   APPEARANCEUR CLOUDY (A) 10/23/2017 0106   LABSPEC 1.009 10/23/2017 0106   PHURINE 9.0 (H) 10/23/2017 0106   GLUCOSEU NEGATIVE 10/23/2017 0106   HGBUR MODERATE (A) 10/23/2017 0106   BILIRUBINUR NEGATIVE 10/23/2017 0106   KETONESUR  NEGATIVE 10/23/2017 0106   PROTEINUR NEGATIVE 10/23/2017 0106   UROBILINOGEN 0.2 12/15/2006 1654   NITRITE NEGATIVE 10/23/2017 0106   LEUKOCYTESUR MODERATE (A) 10/23/2017 0106   Sepsis Labs: @LABRCNTIP (procalcitonin:4,lacticidven:4) )No results found for this or any previous visit (from the past 240 hour(s)).       Radiology Studies: No results found.    Scheduled Meds: . chlorhexidine  15 mL Mouth Rinse BID  . collagenase   Topical Daily  . enoxaparin (LOVENOX) injection  30 mg Subcutaneous Q24H  . feeding supplement (OSMOLITE 1.5 CAL)  237 mL Per Tube 5 X Daily  . feeding supplement (PRO-STAT SUGAR FREE 64)  30 mL Per Tube Daily  . ferrous sulfate  325 mg Oral Q breakfast  . fluvoxaMINE  150 mg Per Tube QHS  . free water  350 mL Per Tube Q6H  . magnesium oxide  400 mg Per Tube BID  . mouth rinse  15 mL Mouth Rinse  q12n4p  . megestrol  400 mg Per Tube Daily  . mirtazapine  45 mg Per Tube QHS  . nutrition supplement (JUVEN)  1 packet Per Tube BID  . OLANZapine zydis  20 mg Sublingual QHS  . phosphorus  500 mg Oral TID WC  . sennosides  5 mL Per Tube QHS  . simvastatin  10 mg Per Tube q1800  . sodium chloride flush  3 mL Intravenous Q12H  . thiamine  100 mg Oral Daily  . vitamin C  250 mg Per Tube BID   Continuous Infusions:   LOS: 80 days    Time spent in minutes: 40 min Extensive chart review performed today as she has been hospitalized > 1 month and is a new patient to me    Calvert Cantor, MD [facility] Pager: [email or web address] Password [email or web address] 12/24/2017, 1:17 PM

## 2017-12-25 ENCOUNTER — Inpatient Hospital Stay (HOSPITAL_COMMUNITY): Payer: Medicare Other

## 2017-12-25 DIAGNOSIS — J690 Pneumonitis due to inhalation of food and vomit: Secondary | ICD-10-CM

## 2017-12-25 LAB — BASIC METABOLIC PANEL
Anion gap: 10 (ref 5–15)
BUN: 42 mg/dL — AB (ref 8–23)
CO2: 27 mmol/L (ref 22–32)
Calcium: 9.7 mg/dL (ref 8.9–10.3)
Chloride: 103 mmol/L (ref 98–111)
Creatinine, Ser: 0.43 mg/dL — ABNORMAL LOW (ref 0.44–1.00)
GFR calc Af Amer: >60 mL/min (ref >60)
GFR calc non Af Amer: >60 mL/min (ref >60)
Glucose, Bld: 108 mg/dL — ABNORMAL HIGH (ref 70–99)
Potassium: 4.6 mmol/L (ref 3.5–5.1)
Sodium: 140 mmol/L (ref 135–145)

## 2017-12-25 LAB — CBC
HCT: 34.3 % — ABNORMAL LOW (ref 36.0–46.0)
Hemoglobin: 10.5 g/dL — ABNORMAL LOW (ref 12.0–15.0)
MCH: 29.7 pg (ref 26.0–34.0)
MCHC: 30.6 g/dL (ref 30.0–36.0)
MCV: 97.2 fL (ref 80.0–100.0)
Platelets: 563 10*3/uL — ABNORMAL HIGH (ref 150–400)
RBC: 3.53 MIL/uL — ABNORMAL LOW (ref 3.87–5.11)
RDW: 13.8 % (ref 11.5–15.5)
WBC: 14 10*3/uL — ABNORMAL HIGH (ref 4.0–10.5)
nRBC: 0 % (ref 0.0–0.2)

## 2017-12-25 LAB — MAGNESIUM: Magnesium: 2 mg/dL (ref 1.7–2.4)

## 2017-12-25 LAB — PHOSPHORUS: Phosphorus: 3.4 mg/dL (ref 2.5–4.6)

## 2017-12-25 MED ORDER — AMOXICILLIN-POT CLAVULANATE 875-125 MG PO TABS: 1.0000 | ORAL_TABLET | Freq: Two times a day (BID) | ORAL | Status: DC

## 2017-12-25 MED ORDER — AMOXICILLIN-POT CLAVULANATE 250-62.5 MG/5ML PO SUSR
500.0000 mg | Freq: Two times a day (BID) | ORAL | Status: DC
  Administered 2017-12-25 – 2017-12-26 (×3): 500 mg
  Filled 2017-12-25 (×4): qty 10

## 2017-12-25 NOTE — Progress Notes (Signed)
PROGRESS NOTE    Amanda Hall   FAO:[id number]  DOB: June 12, 1950  DOA: 10/05/2017 PCP: [name], MD   Brief Narrative:  Amanda Hall is a 66 yo femalewith medical history significant for depression with anxiety and recent inpatient psychiatry admission, now presenting to the emergency department for evaluation of increased confusion, agitation, refusal to take her medications, and not eating or drinking.  Patient was admitted to inpatient psychiatry approximately 1 month ago and has been living with her sister since discharge.  She has reportedly been refusing her medications, not eating or drinking, and becoming increasingly confused and agitated.   She has been seen by psychiatry who has recommended inpatient psych placement however has been unable to get a bed. Due to her poor oral intake, she has received a PEG tube and is essentially bed bound and not cooperating with PT. Psych has been treating her with oral medications. Re-evaluated by psych 12/9, it was felt that she may receive treatment for her depression faster is she was not awaiting a psych facility and was able to be discharged. She needs SNF and is difficult to place.    Subjective: Awake. She initially states she has no complaints today. I have noted that she has yellow sputum in her mouth. When asking about this, she admits that she has been coughing today. On talking with the RN, she has been coughing up yellow sputum.   Assessment & Plan:   Principal Problem: Severe anxiety and depression -  Cont Zyprexa ,Luvox , Remeron,  Klonopin 0.5 mg TID PRN - per psych, she will likely receive faster treatment if she was to be discharged and now recommending outpt psych with Dr Neila Gear - we are awaiting SNF placement   Active Problems:  RLL pneumonia - coughing up yellow sputum, WBC steadily rising- CXR now shows a RLL infiltrate - suspect aspiration of PEG tube feeds or oral secretions - no hypoxia or resp  distress - start Augmentin - order aspiration precations     Dysphagia, very poor oral intake,  Severe malnutrition - s/p PEG during this hospital stay  Hypokalemia, hypophosphatemia, hypomagnesemia -likely re-feeding syndrome - replacing and following labs- cont K phos and add additional K as needed   Acute kidney injury -Likely due to dehydration, anorexia, severe protein calorie malnutrition, resolved  Iron deficiency anemia -Continue iron supplementation  Pressure injury of skin -Stage II, partial-thickness, left buttocks, not present on admission -Wound care per nursing   Cardiac amyloidosis? - Suspected on Transthoracic Echocardiogram. - no heart failure noted- currently her psych issues and overall malnutrition and bed bound state are more imminent issues- if she is to recuperate from these, further cardiac work up can be obtained   DVT prophylaxis: Lovenox Code Status: Full code Disposition Plan: SW working on SNF bed Consultants:   psych Procedures:   PEG  2 D ECHO 11/18 Impressions:  - Echocardiogram strong suggestive of cardiac amyloidosis. Presence   of a pericardial effusion, thickened valves, thickened atrial   septum, and concentric myocardial thickening, as well as RV wall   thickening. Normal LVEF 65-70%, no regional wall motion   abnormalities. Moderate aortic valve regurgitation. Possible   dilation of the sinuses of Valsalva. Severely thickened mitral   valve leaflets with trivial regurgitation. Moderate LA   enlargement. Moderate pericardial effusion without current   features of tamponade physiology.  Recommendations:  If clinically indicated: 1) consider evaluation of cardiac amyloidosis with serum and urine protein electrophoresis with immunofixation,  cardiac MRI and/or nuclear medicine PYP study. 2) consider cardiac CT or cardiac MR to better evaluate aortic sinus of Valsalva possible dilation. Antimicrobials:  Anti-infectives (From  admission, onward)   Start     Dose/Rate Route Frequency Ordered Stop   11/28/17 0915  ceFAZolin (ANCEF) IVPB 1 g/50 mL premix     1 g 100 mL/hr over 30 Minutes Intravenous To Radiology 11/28/17 0907 11/29/17 0915   11/28/17 0847  ceFAZolin (ANCEF) 2-4 GM/100ML-% IVPB    Note to Pharmacy:  Domenica Fail   : cabinet override      11/28/17 0847 11/28/17 1046   11/28/17 0845  ceFAZolin (ANCEF) powder 1 g  Status:  Discontinued     1 g Other To Surgery 11/28/17 0830 11/28/17 0907   10/23/17 0830  cefTRIAXone (ROCEPHIN) 1 g in sodium chloride 0.9 % 100 mL IVPB  Status:  Discontinued     1 g 200 mL/hr over 30 Minutes Intravenous Daily 10/23/17 0721 10/26/17 1400       Objective: Vitals:   12/24/17 1636 12/24/17 2123 12/25/17 0538 12/25/17 0923  BP: 132/88 130/69 (!) 162/98 (!) 148/82  Pulse: (!) 112 (!) 105 (!) 126 (!) 133  Resp: 16 (!) 23 (!) 22 18  Temp: 98.6 F (37 C) 98.5 F (36.9 C) 98.2 F (36.8 C) 97.8 F (36.6 C)  TempSrc: Oral Oral  Oral  SpO2: 98% 98% 97% 93%  Weight:      Height:        Intake/Output Summary (Last 24 hours) at 12/25/2017 1136 Last data filed at 12/25/2017 0900 Gross per 24 hour  Intake 1174 ml  Output 420 ml  Net 754 ml   Filed Weights   12/18/17 2210 12/19/17 0500 12/23/17 2147  Weight: 46.9 kg 46.9 kg 45.5 kg    Examination: General exam: Appears comfortable - cachectic Respiratory system: yellow sputum in mouth- lungs sound clear  Cardiovascular system: S1 & S2 heard,  No murmurs  Gastrointestinal system: Abdomen soft, non-tender, nondistended. Normal bowel sound. No organomegaly Extremities: No cyanosis, clubbing or edema Skin: No rashes or ulcers Psychiatry: very anxious appearing     Data Reviewed: I have personally reviewed following labs and imaging studies  CBC: Recent Labs  Lab 12/23/17 0440 12/25/17 0740  WBC 12.6* 14.0*  HGB 8.8* 10.5*  HCT 27.9* 34.3*  MCV 94.9 97.2  PLT 320 563*   Basic Metabolic Panel: Recent  Labs  Lab 12/23/17 0440 12/24/17 0423 12/25/17 0600  NA 140 141 140  K 2.7* 3.9 4.6  CL 97* 102 103  CO2 31 26 27   GLUCOSE 121* 109* 108*  BUN 58* 62* 42*  CREATININE 0.54 0.62 0.43*  CALCIUM 9.2 9.5 9.7  MG 2.0  --  2.0  PHOS 2.4*  --  3.4   GFR: Estimated Creatinine Clearance: 49.7 mL/min (A) (by C-G formula based on SCr of 0.43 mg/dL (L)). Liver Function Tests: No results for input(s): AST, ALT, ALKPHOS, BILITOT, PROT, ALBUMIN in the last 168 hours. No results for input(s): LIPASE, AMYLASE in the last 168 hours. No results for input(s): AMMONIA in the last 168 hours. Coagulation Profile: No results for input(s): INR, PROTIME in the last 168 hours. Cardiac Enzymes: No results for input(s): CKTOTAL, CKMB, CKMBINDEX, TROPONINI in the last 168 hours. BNP (last 3 results) No results for input(s): PROBNP in the last 8760 hours. HbA1C: No results for input(s): HGBA1C in the last 72 hours. CBG: No results for input(s): GLUCAP in the last  168 hours. Lipid Profile: No results for input(s): CHOL, HDL, LDLCALC, TRIG, CHOLHDL, LDLDIRECT in the last 72 hours. Thyroid Function Tests: No results for input(s): TSH, T4TOTAL, FREET4, T3FREE, THYROIDAB in the last 72 hours. Anemia Panel: No results for input(s): VITAMINB12, FOLATE, FERRITIN, TIBC, IRON, RETICCTPCT in the last 72 hours. Urine analysis:    Component Value Date/Time   COLORURINE AMBER (A) 10/23/2017 0106   APPEARANCEUR CLOUDY (A) 10/23/2017 0106   LABSPEC 1.009 10/23/2017 0106   PHURINE 9.0 (H) 10/23/2017 0106   GLUCOSEU NEGATIVE 10/23/2017 0106   HGBUR MODERATE (A) 10/23/2017 0106   BILIRUBINUR NEGATIVE 10/23/2017 0106   KETONESUR NEGATIVE 10/23/2017 0106   PROTEINUR NEGATIVE 10/23/2017 0106   UROBILINOGEN 0.2 12/15/2006 1654   NITRITE NEGATIVE 10/23/2017 0106   LEUKOCYTESUR MODERATE (A) 10/23/2017 0106   Sepsis Labs: @LABRCNTIP (procalcitonin:4,lacticidven:4) )No results found for this or any previous visit (from  the past 240 hour(s)).       Radiology Studies: Dg Chest Port 1 View  Result Date: 12/25/2017 CLINICAL DATA:  Cough. EXAM: PORTABLE CHEST 1 VIEW COMPARISON:  October 05, 2017 FINDINGS: Right basilar infiltrate. The heart, hila, mediastinum, lungs, and pleura are otherwise unremarkable. IMPRESSION: Right lower lobe infiltrate, likely pneumonia. Recommend follow-up to resolution. Electronically Signed   By: Gerome Samavid  Williams III M.D   On: 12/25/2017 09:30      Scheduled Meds: . chlorhexidine  15 mL Mouth Rinse BID  . collagenase   Topical Daily  . enoxaparin (LOVENOX) injection  30 mg Subcutaneous Q24H  . feeding supplement (OSMOLITE 1.5 CAL)  237 mL Per Tube 5 X Daily  . feeding supplement (PRO-STAT SUGAR FREE 64)  30 mL Per Tube Daily  . ferrous sulfate  325 mg Oral Q breakfast  . fluvoxaMINE  150 mg Per Tube QHS  . free water  350 mL Per Tube Q6H  . magnesium oxide  400 mg Per Tube BID  . mouth rinse  15 mL Mouth Rinse q12n4p  . megestrol  400 mg Per Tube Daily  . mirtazapine  45 mg Per Tube QHS  . nutrition supplement (JUVEN)  1 packet Per Tube BID  . OLANZapine zydis  20 mg Sublingual QHS  . phosphorus  500 mg Oral TID WC  . sennosides  5 mL Per Tube QHS  . simvastatin  10 mg Per Tube q1800  . sodium chloride flush  3 mL Intravenous Q12H  . thiamine  100 mg Oral Daily  . vitamin C  250 mg Per Tube BID   Continuous Infusions:   LOS: 81 days    Time spent in minutes: 40 min Extensive chart review performed today as she has been hospitalized > 1 month and is a new patient to me    Calvert CantorSaima Johnpatrick Jenny, MD [facility] Pager: [email or web address] Password [email or web address] 12/25/2017, 11:36 AM

## 2017-12-26 ENCOUNTER — Inpatient Hospital Stay (HOSPITAL_COMMUNITY): Payer: Medicare Other

## 2017-12-26 DIAGNOSIS — G9340 Encephalopathy, unspecified: Secondary | ICD-10-CM

## 2017-12-26 DIAGNOSIS — Z515 Encounter for palliative care: Secondary | ICD-10-CM

## 2017-12-26 DIAGNOSIS — Z7189 Other specified counseling: Secondary | ICD-10-CM

## 2017-12-26 DIAGNOSIS — J9601 Acute respiratory failure with hypoxia: Secondary | ICD-10-CM

## 2017-12-26 LAB — BASIC METABOLIC PANEL
Anion gap: 13 (ref 5–15)
BUN: 59 mg/dL — ABNORMAL HIGH (ref 8–23)
CHLORIDE: 102 mmol/L (ref 98–111)
CO2: 23 mmol/L (ref 22–32)
Calcium: 8.9 mg/dL (ref 8.9–10.3)
Creatinine, Ser: 0.56 mg/dL (ref 0.44–1.00)
GFR calc Af Amer: >60 mL/min (ref >60)
GFR calc non Af Amer: >60 mL/min (ref >60)
Glucose, Bld: 231 mg/dL — ABNORMAL HIGH (ref 70–99)
POTASSIUM: 3.8 mmol/L (ref 3.5–5.1)
Sodium: 138 mmol/L (ref 135–145)

## 2017-12-26 LAB — BLOOD GAS, ARTERIAL
Acid-Base Excess: 4 mmol/L — ABNORMAL HIGH (ref 0.0–2.0)
Acid-Base Excess: 5.2 mmol/L — ABNORMAL HIGH (ref 0.0–2.0)
Bicarbonate: 27.1 mmol/L (ref 20.0–28.0)
Bicarbonate: 28.7 mmol/L — ABNORMAL HIGH (ref 20.0–28.0)
DRAWN BY: 398991
Delivery systems: POSITIVE
Drawn by: 398991
EXPIRATORY PAP: 6
FIO2: 100
FIO2: 100
Inspiratory PAP: 12
O2 Saturation: 78.8 %
O2 Saturation: 96.5 %
PH ART: 7.494 — AB (ref 7.350–7.450)
Patient temperature: 98.6
Patient temperature: 98.4
RATE: 10 resp/min
pCO2 arterial: 34.8 mmHg (ref 32.0–48.0)
pCO2 arterial: 37.6 mmHg (ref 32.0–48.0)
pH, Arterial: 7.503 — ABNORMAL HIGH (ref 7.350–7.450)
pO2, Arterial: 42.6 mmHg — ABNORMAL LOW (ref 83.0–108.0)
pO2, Arterial: 80.5 mmHg — ABNORMAL LOW (ref 83.0–108.0)

## 2017-12-26 LAB — CBC
HCT: 30.8 % — ABNORMAL LOW (ref 36.0–46.0)
Hemoglobin: 9.7 g/dL — ABNORMAL LOW (ref 12.0–15.0)
MCH: 30.2 pg (ref 26.0–34.0)
MCHC: 31.5 g/dL (ref 30.0–36.0)
MCV: 96 fL (ref 80.0–100.0)
Platelets: 332 10*3/uL (ref 150–400)
RBC: 3.21 MIL/uL — AB (ref 3.87–5.11)
RDW: 13.9 % (ref 11.5–15.5)
WBC: 12.3 10*3/uL — ABNORMAL HIGH (ref 4.0–10.5)
nRBC: 0 % (ref 0.0–0.2)

## 2017-12-26 LAB — LACTIC ACID, PLASMA
Lactic Acid, Venous: 1.4 mmol/L (ref 0.5–1.9)
Lactic Acid, Venous: 1.4 mmol/L (ref 0.5–1.9)

## 2017-12-26 LAB — GLUCOSE, CAPILLARY: Glucose-Capillary: 212 mg/dL — ABNORMAL HIGH (ref 70–99)

## 2017-12-26 MED ORDER — VANCOMYCIN HCL IN DEXTROSE 750-5 MG/150ML-% IV SOLN: 750.0000 mg | INTRAVENOUS | Status: DC

## 2017-12-26 MED ORDER — SODIUM CHLORIDE 0.9 % IV SOLN
1.0000 g | Freq: Two times a day (BID) | INTRAVENOUS | Status: DC
  Administered 2017-12-26 – 2017-12-27 (×2): 1 g via INTRAVENOUS
  Filled 2017-12-26 (×2): qty 1

## 2017-12-26 MED ORDER — MORPHINE SULFATE (PF) 2 MG/ML IV SOLN
2.0000 mg | INTRAVENOUS | Status: DC | PRN
  Administered 2017-12-26 – 2017-12-27 (×5): 2 mg via INTRAVENOUS
  Administered 2017-12-27: 3 mg via INTRAVENOUS
  Administered 2017-12-27 – 2018-01-02 (×7): 2 mg via INTRAVENOUS
  Filled 2017-12-26 (×4): qty 1
  Filled 2017-12-26: qty 2
  Filled 2017-12-26 (×9): qty 1

## 2017-12-26 MED ORDER — VANCOMYCIN HCL IN DEXTROSE 1-5 GM/200ML-% IV SOLN
1000.0000 mg | Freq: Once | INTRAVENOUS | Status: AC
  Administered 2017-12-26: 1000 mg via INTRAVENOUS
  Filled 2017-12-26: qty 200

## 2017-12-26 NOTE — Progress Notes (Signed)
Patient transfer from 70M to 2W patient respond to pain, not orient and non ambulatory. She arrive on the unit on Bipap having work of breathing. She was placed on telemetry. Patient have a unstage area on her sacrum  And heels dry. [facility] LLCNadine Donielle Kaigler RN.

## 2017-12-26 NOTE — Care Management Note (Signed)
Case Management Note Note initiated by Tomi BambergerBrenda Graves-Bigelow, RN CM on 12/20/2017  Patient Details  Name: Amanda Hall MRN: [id number] Date of Birth: May 05, 1950  Subjective/Objective:  Pt presented for AKI and acute encephalopathy. Hx of Depression-psych consulted recommended inpatient psych. CSW is following for disposition needs. [facility]                  Action/Plan: CM will continue to monitor for additional disposition needs.   Expected Discharge Date:                  Expected Discharge Plan:  [facility]  In-House Referral:  Clinical Social Work  Discharge planning Services  CM Consult  Post Acute Care Choice:  NA Choice offered to:  NA  DME Arranged:  N/A DME Agency:  NA  HH Arranged:  NA HH Agency:  NA  Status of Service:  Completed, signed off  If discussed at Long Length of Stay Meetings, dates discussed:    Additional Comments: 12/26/2017 1200 PM Hortencia ConradiWendi Kyuss Hale, RN CM-[facility] pending [address]. Will continue to follow for transition of care needs.   12/21/2017 11:59 AM Bess KindsWendi B Jerad Dunlap, RN Left voicemail for financial counselor to request start of Medicaid application  1435 12-20-17 Tomi BambergerBrenda Graves-Bigelow, [place],BSN [phone] Patient with AKI- poor po intake post PEG tube placement 11-18-17. Hx of depressive disorder-psych re-consulted and states gradual improvement. Per Psych most beneficial to transition to SNF. CSW assisting with transition of care needs to facility. CM will continue to monitor for additional transition of care needs.   Bess KindsWendi B Dell Briner, RN 12/26/2017, 11:59 AM Case Management Note  Patient Details  Name: Amanda BreezeDana Hall MRN: [id number] Date of Birth: May 05, 1950  Subjective/Objective:                    Action/Plan:   Expected Discharge Date:                  Expected Discharge Plan:  [facility]  In-House Referral:  Clinical Social Work  Discharge planning  Services  CM Consult  Post Acute Care Choice:  NA Choice offered to:  NA  DME Arranged:  N/A DME Agency:  NA  HH Arranged:  NA HH Agency:  NA  Status of Service:  Completed, signed off  If discussed at Long Length of Stay Meetings, dates discussed:    Additional Comments:  Bess KindsWendi B Dak Szumski, RN 12/26/2017, 11:58 AM

## 2017-12-26 NOTE — Progress Notes (Signed)
Pharmacy Antibiotic Note  Coolidge BreezeDana Hall is a 67 y.o. female admitted on 10/05/2017 with increased confusion and agitation.  Pharmacy has been consulted for vancomycin and cefepime dosing.  Patient afebrile this morning, wbc stable at 12.3. Patient s/p ceftriaxone x3d for UTI. Now with respiratory failure in need of bipap this afternoon. New orders for vancomycin and cefepime.   Vancomycin 750 mg IV Q 24 hrs. Goal AUC 400-550. Expected AUC: 491 SCr used: 0.8   Plan: Cefepime 1g IV q12 hours Vancomycin 1g IV now then 750mg  q24 hours  Height: 5\' 2"  (157.5 cm) Weight: 100 lb 5 oz (45.5 kg) IBW/kg (Calculated) : 50.1  Temp (24hrs), Avg:98.4 F (36.9 C), Min:98.3 F (36.8 C), Max:98.6 F (37 C)  Recent Labs  Lab 12/23/17 0440 12/24/17 0423 12/25/17 0600 12/25/17 0740 12/26/17 0404  WBC 12.6*  --   --  14.0* 12.3*  CREATININE 0.54 0.62 0.43*  --  0.56    Estimated Creatinine Clearance: 49.7 mL/min (by C-G formula based on SCr of 0.56 mg/dL).    Allergies  Allergen Reactions  . Codeine Other (See Comments)    "Makes my head feel crazy"  . Sulfonamide Derivatives Nausea Only  . Tetanus Toxoid Other (See Comments)    "Made a Knot" (at site)   Thank you for allowing pharmacy to be a part of this patient's care.  Sheppard CoilFrank Illyria Sobocinski PharmD., BCPS Clinical Pharmacist 12/26/2017 3:01 PM

## 2017-12-26 NOTE — Consult Note (Addendum)
NAME:  Amanda Hall, MRN:  [id number], DOB:  Dec 28, 1950, LOS: 82 ADMISSION DATE:  10/05/2017, CONSULTATION DATE:  12/26/17 REFERRING MD:  Dr. Butler Denmark, CHIEF COMPLAINT:  Shortness of breath   Brief History   67 y/o F hospitalized 9/25 with severe depression to the point of failure to thrive.  Prolonged hospitalization due to weakness.  Had been recommended for inpatient PSY but she has been too weak to get out of bed.  Developed RLL PNA (suspected aspiration) and PCCM consulted 12/16 for respiratory failure.  History of present illness   67 y/o F who was admitted on 10/05/17 with concerns for failure to thrive.  Prior to admit, she had been living with her sister.  Her sister had taken out IVC paper work on her out of concern for severe depression.  She has been seen by Psychiatry and recommended for inpatient PSY but has been unable to get out of bed.  Hospitalization prolonged with psychiatric issues, dysphagia / poor oral intake, AKI, IDA, ulcerations of skin and electrolyte abnormalities.    The patient developed respiratory distress on 12/16 and was transferred to the progressive care.  PCCM consulted for evaluation.    Past Medical History  Depression / Anxiety  Anemia  Hemorrhoids   Significant Hospital Events   09/25 Admit  12/16 PCCM consulted   Consults:  PCCM 12/16  Procedures:    Significant Diagnostic Tests:    Micro Data:  UC 9/25 >> greater 100k aerococcus  BCx2 9/25 >> negative  UC 10/13 >> greater 100k aerococcus  Antimicrobials:  Cefepime 12/16 >>  Vancomycin 12/16 >>   Interim history/subjective:  RN reports respiratory distress despite BiPAP.   Objective   Blood pressure 110/84, pulse (!) 128, temperature 98.4 F (36.9 C), temperature source Oral, resp. rate (!) 36, height 5\' 2"  (1.575 m), weight 45.5 kg, SpO2 97 %.        Intake/Output Summary (Last 24 hours) at 12/26/2017 1550 Last data filed at 12/26/2017 1100 Gross per 24 hour  Intake 1677 ml    Output 275 ml  Net 1402 ml   Filed Weights   12/18/17 2210 12/19/17 0500 12/23/17 2147  Weight: 46.9 kg 46.9 kg 45.5 kg    Examination: General: cachectic adult female lying in bed on bipap  HEENT: MM pink/moist, BiPAP mask in place Neuro: Awakens to voice, answers questions appropriately, appears uncomfortable CV: s1s2 rrr, no m/r/g PULM: even/non-labored, lungs bilaterally clear anterior, diminished bases  WU:JWJX, non-tender, bsx4 active  Extremities: warm/dry, no edema  Skin: no rashes or lesions  Resolved Hospital Problem list     Assessment & Plan:   Acute Hypoxemic Respiratory Failure  -in setting of RLL PNA  P: PRN morphine for pain / work of breathing  PRN bipap for patient comfort only (not likely to offer real benefit) ABX per primary    Failure to Thrive  P: Palliative care consulted, appreciate input Transition to full comfort care > see Dr. Percival Spanish note  Best practice:  Diet: NPO Pain/Anxiety/Delirium protocol (if indicated): n/a VAP protocol (if indicated): n/a DVT prophylaxis: lovenox  GI prophylaxis: n/a Glucose control: n/a Mobility: bed rest  Code Status: DNR  Family Communication: Sister updated via phone 12/16 per Dr. Molli Knock.   Disposition: Progressive care  Labs   CBC: Recent Labs  Lab 12/23/17 0440 12/25/17 0740 12/26/17 0404  WBC 12.6* 14.0* 12.3*  HGB 8.8* 10.5* 9.7*  HCT 27.9* 34.3* 30.8*  MCV 94.9 97.2 96.0  PLT 320 563*  332    Basic Metabolic Panel: Recent Labs  Lab 12/23/17 0440 12/24/17 0423 12/25/17 0600 12/26/17 0404  NA 140 141 140 138  K 2.7* 3.9 4.6 3.8  CL 97* 102 103 102  CO2 31 26 27 23   GLUCOSE 121* 109* 108* 231*  BUN 58* 62* 42* 59*  CREATININE 0.54 0.62 0.43* 0.56  CALCIUM 9.2 9.5 9.7 8.9  MG 2.0  --  2.0  --   PHOS 2.4*  --  3.4  --    GFR: Estimated Creatinine Clearance: 49.7 mL/min (by C-G formula based on SCr of 0.56 mg/dL). Recent Labs  Lab 12/23/17 0440 12/25/17 0740 12/26/17 0404  12/26/17 1432  WBC 12.6* 14.0* 12.3*  --   LATICACIDVEN  --   --   --  1.4    Liver Function Tests: No results for input(s): AST, ALT, ALKPHOS, BILITOT, PROT, ALBUMIN in the last 168 hours. No results for input(s): LIPASE, AMYLASE in the last 168 hours. No results for input(s): AMMONIA in the last 168 hours.  ABG    Component Value Date/Time   PHART 7.503 (H) 12/26/2017 1400   PCO2ART 34.8 12/26/2017 1400   PO2ART 42.6 (L) 12/26/2017 1400   HCO3 27.1 12/26/2017 1400   TCO2 33 (H) 10/05/2017 1827   O2SAT 78.8 12/26/2017 1400     Coagulation Profile: No results for input(s): INR, PROTIME in the last 168 hours.  Cardiac Enzymes: No results for input(s): CKTOTAL, CKMB, CKMBINDEX, TROPONINI in the last 168 hours.  HbA1C: No results found for: HGBA1C  CBG: Recent Labs  Lab 12/26/17 1329  GLUCAP 212*    Review of Systems:   Unable to complete as patient is on BiPAP  Past Medical History  She,  has a past medical history of Allergy, Anemia, Anxiety, Depression, and Hemorrhoids.   Surgical History    Past Surgical History:  Procedure Laterality Date  . arm surgery     Left  . IR GASTROSTOMY TUBE MOD SED  11/28/2017  . TUBAL LIGATION       Social History   reports that she has never smoked. She has never used smokeless tobacco. She reports that she does not drink alcohol or use drugs.   Family History   Her family history includes Hypertension in her sister; Stroke in her mother. There is no history of Cancer or Diabetes.   Allergies Allergies  Allergen Reactions  . Codeine Other (See Comments)    "Makes my head feel crazy"  . Sulfonamide Derivatives Nausea Only  . Tetanus Toxoid Other (See Comments)    "Made a Knot" (at site)     Home Medications  Prior to Admission medications   Medication Sig Start Date End Date Taking? Authorizing Provider  fluvoxaMINE (LUVOX) 50 MG tablet Take 3 tablets (150 mg total) by mouth at bedtime. For mood control 09/08/17   Yes Rhetta Mura, MD  Lactobacillus Rhamnosus, GG, (CULTURELLE) CAPS Take 1 capsule by mouth daily.   Yes [provider]  mirtazapine (REMERON SOL-TAB) 45 MG disintegrating tablet Take 45 mg by mouth at bedtime.   Yes [provider]  OLANZapine zydis (ZYPREXA) 10 MG disintegrating tablet Take 10 mg by mouth at bedtime.   Yes [provider]  simvastatin (ZOCOR) 40 MG tablet Take 40 mg by mouth daily.  09/15/17  Yes [provider]  tetrahydrozoline-zinc (VISINE-AC) 0.05-0.25 % ophthalmic solution Place 2 drops into both eyes 3 (three) times daily as needed (for dryness).   Yes  [provider]  venlafaxine XR (EFFEXOR-XR) 37.5 MG 24 hr capsule Take 75 mg by mouth daily with breakfast.    Yes [provider]  hydrocortisone cream 1 % Apply to affected area 2 times daily Patient taking differently: Apply 1 application topically 2 (two) times daily as needed (for anal itching).  04/18/17   Palumbo, April, MD  LORazepam (ATIVAN) 1 MG tablet Take 1 tablet (1 mg total) by mouth 2 (two) times daily. Patient not taking: Reported on 10/05/2017 09/08/17   Rhetta MuraSamtani, Jai-Gurmukh, MD  megestrol (MEGACE) 40 MG tablet Take 1 tablet (40 mg total) by mouth daily. Patient not taking: Reported on 10/05/2017 06/11/17   Money, Gerlene Burdockravis B, FNP  polyethylene glycol [facility](MIRALAX) packet Take 17 g by mouth 2 (two) times daily. Patient not taking: Reported on 10/05/2017 04/18/17   Nicanor AlconPalumbo, April, MD         Canary BrimBrandi Ollis, NP-C  Pulmonary & Critical Care Pgr: [phone] or if no answer [phone] 12/26/2017, 4:18 PM  Attending Note:  67 year old female with failure to thrive who has been in the hospital for 3 months then developed a pneumonia on the right on CXR that I reviewed myself and developed respiratory failure.  On exam, she is agitated, in pain with sinus tachycardia and increase WOB with R>L crackles.  I reviewed the chart in full.  BiPAP will not be effective  in this case.  Either intubation or comfort care are the appropriate course at this time.  Spoke with sister over the phone, she relayed that patient never expressed her wishes but she does not feel that intubation is what the patient would want.  She is only concerned for comffort.  Will change patient to DNR and start morphine.  I spoke with Dr. Butler Denmarkizwan and conveyed the case to focus more on comfort.  Palliative care called.  The patient is critically ill with multiple organ systems failure and requires high complexity decision making for assessment and support, frequent evaluation and titration of therapies, application of advanced monitoring technologies and extensive interpretation of multiple databases.   Critical Care Time devoted to patient care services described in this note is  45  Minutes. This time reflects time of care of this signee Dr Koren BoundWesam Szymon Foiles. This critical care time does not reflect procedure time, or teaching time or supervisory time of PA/NP/Med student/Med Resident etc but could involve care discussion time.  Alyson ReedyWesam G. Demetries Coia, M.D. [facility]. Pager: [phone]. After hours pager: [phone].

## 2017-12-26 NOTE — Progress Notes (Signed)
Occupational Therapy Treatment Patient Details Name: Amanda Hall MRN: [id number] DOB: 04-18-1950 Today's Date: 12/26/2017    History of present illness Pt is a 67 y/o female admitted secondary to major depressive disorder. Pt with increased confusion and agitation prior to admission. Found to have AKI and acute encephalopathy. CT negative for acute abnormality. PT was treating pt but signed off on 11/8 due to 3 refusals and no progression.  PMH includes anxiety and depression.    OT comments  Pt demonstrates a decline since last OT session. Pt with increased lethargy this session, did not open eyes, mininmally participatory, followed simple commands minimally. Pt very deconditioned and minimally assisting OT and nurse tech with bed mobility rolling. D/c plan is now for SNF   Follow Up Recommendations  SNF;Supervision/Assistance - 24 hour    Equipment Recommendations  Other (comment)(TBD at next venue of care)    Recommendations for Other Services      Precautions / Restrictions Precautions Precautions: Fall Restrictions Weight Bearing Restrictions: No       Mobility Bed Mobility Overal bed mobility: Needs Assistance Bed Mobility: Rolling Rolling: Max assist;+2 for physical assistance         General bed mobility comments: pt minimally participatory, yelling out  Transfers                 General transfer comment: unable due to lethargy anf fatigue    Balance       Sitting balance - Comments: unable                                   ADL either performed or assessed with clinical judgement   ADL Overall ADL's : Needs assistance/impaired     Grooming: Wash/dry face;Wash/dry hands;Total assistance;Bed level Grooming Details (indicate cue type and reason): hand over hand assist                               General ADL Comments: pt lethargic and minimally participatory     Vision Baseline Vision/History: Wears glasses      Perception     Praxis      Cognition Arousal/Alertness: Lethargic Behavior During Therapy: Anxious;Flat affect Overall Cognitive Status: No family/caregiver present to determine baseline cognitive functioning                                 General Comments: pt moaning with movement        Exercises     Shoulder Instructions       General Comments      Pertinent Vitals/ Pain       Pain Assessment: Faces Faces Pain Scale: Hurts whole lot Pain Location: all 4 extremities Pain Descriptors / Indicators: Grimacing;Moaning Pain Intervention(s): Limited activity within patient's tolerance;Monitored during session;Premedicated before session;Repositioned  Home Living                                          Prior Functioning/Environment              Frequency  Min 2X/week        Progress Toward Goals  OT Goals(current goals can now be found in the care plan section)  Progress  towards OT goals: Not progressing toward goals - comment(pt with increased lethargy this session, did not open eyes, mininmally participatory, followed simple commands minimally)  Acute Rehab OT Goals Patient Stated Goal: did not state  Plan Discharge plan needs to be updated    Co-evaluation                 AM-PAC OT "6 Clicks" Daily Activity     Outcome Measure   Help from another person eating meals?: Total Help from another person taking care of personal grooming?: Total Help from another person toileting, which includes using toliet, bedpan, or urinal?: Total Help from another person bathing (including washing, rinsing, drying)?: Total Help from another person to put on and taking off regular upper body clothing?: Total Help from another person to put on and taking off regular lower body clothing?: Total 6 Click Score: 6    End of Session    OT Visit Diagnosis: Other abnormalities of gait and mobility (R26.89);Muscle weakness  (generalized) (M62.81);Pain   Activity Tolerance Patient limited by fatigue;Patient limited by pain;Patient limited by lethargy   Patient Left in bed;with nursing/sitter in room;with call bell/phone within reach   Nurse Communication          Time: 0922-0938 OT Time Calculation (min): 16 min  Charges: OT General Charges $OT Visit: 1 Visit OT Treatments $Therapeutic Activity: 8-22 mins     Galen ManilaSpencer, Tedd Cottrill Jeanette 12/26/2017, 12:04 PM

## 2017-12-26 NOTE — Significant Event (Signed)
Rapid Response Event Note RN called, sats 88% on NRB  Overview: Time Called: 1338 Arrival Time: 1345 Event Type: Respiratory  Initial Focused Assessment: On arrival pt sitting upright in bed, skin warm and dry, denies any pain, on a NRB, sats 88%. Alert and oriented x3. Dr. Butler Denmarkizwan notified prior to my arrival, new orders given and completed. RT contacted for Bipap   Interventions: ABG 7.50/34.8/42.6/27.1 CXR significant worsening pneumonia Bipap for 1 hour then repeat ABG Transfer to 2w22  End time 1545  Event Summary: Name of Physician Notified: Dr. Butler Denmarkizwan  at (PTA RRT )    at    Outcome: Transferred (Comment)     Phillips GroutSHULAR, Sandi CarneLESLIE Paige

## 2017-12-26 NOTE — Progress Notes (Signed)
Called to patient room to initiate bipap.  Patient placed on V60 Bipap 12/6 with 100% FiO2 via full face mask.  Patient tolerating well with SpO2 97%.  Will continue to monitor.

## 2017-12-26 NOTE — Progress Notes (Addendum)
PROGRESS NOTE    Amanda Hall   NFA:[id number]  DOB: August 31, 1950  DOA: 10/05/2017 PCP: [name], MD   Brief Narrative:  [name] is a 67 yo femalewith medical history significant for depression with anxiety and recent inpatient psychiatry admission, now presenting to the emergency department for evaluation of increased confusion, agitation, refusal to take her medications, and not eating or drinking.  Patient was admitted to inpatient psychiatry approximately 1 month ago and has been living with her sister since discharge.  She has reportedly been refusing her medications, not eating or drinking, and becoming increasingly confused and agitated. Sister took out IVC as she was not taking medications and was deteriorating.  She has been seen by psychiatry who has recommended inpatient psych placement again however she has been unable to get a bed. Due to her poor oral intake, she has received a PEG tube and is essentially bed bound and not able to adequately cooperate with PT. Psych has been treating her with oral medications (via PEG now). Re-evaluated by psych 12/9, it was felt that she may receive treatment for her depression faster if she was able to be discharged. She needs SNF as she I very deconditioned now and is difficult to place.    Subjective: Awake. OT in room trying to work with her. She has no complaints for me. Cough is better  Assessment & Plan:   Principal Problem: Severe anxiety and depression - which is debilitating to a point where she is not eating- now has a PEG and unable to walk due to severe deconditioning -  Cont Zyprexa ,Luvox , Remeron,  Klonopin 0.5 mg TID PRN - psych has been following intermittently - she was IVC' but has not had a bed and has been in the hospital since 9/25 - per psych, she will likely receive faster treatment if she was to be discharged and now recommending outpt psych with Dr Neila Gear- this will need to be arranged once we  know when she is leaving the hospital - we are awaiting SNF placement which case management is working on   Active Problems:  RLL pneumonia - 12/25> coughing up yellow sputum, WBC steadily rising- obtained CXR which shows a RLL infiltrate - suspect aspiration of PEG tube feeds or oral secretions - started Augmentin x 5 days - ordered aspiration precautions -  - 12/16- severely hypoxic -  CXR show extensive right sided infiltrate- she is unable to tell me if she wants to be intubated- spoke with sister who is unsure- no on has POA- will put her on BiPAP for now and consult pulmonary- PCCM called for consult-     Dysphagia, very poor oral intake,  Severe malnutrition - in setting of severe depression and anxiety - s/p PEG during this hospital stay  Hypokalemia, hypophosphatemia, hypomagnesemia -likely re-feeding syndrome after PEG - replacing and following labs- cont K phos and add additional K as needed   Acute kidney injury -Likely due to dehydration, anorexia, severe protein calorie malnutrition - resolved  Iron deficiency anemia -Continue iron supplementation  Pressure injury of skin -Stage II, partial-thickness, left buttocks, not present on admission -Wound care per nursing   Cardiac amyloidosis? - Suspected on Transthoracic Echocardiogram - cardiology consulted 11/6 - no heart failure noted- currently her psych issues and overall malnutrition and bed bound state are more imminent issues- if she is to recuperate from these, further cardiac work up can be obtained   Severe deconditioning - needs SNF per  PT  DVT prophylaxis: Lovenox Code Status: Full code Disposition Plan: SW working on SNF bed Consultants:   Psych  Palliative care  Cardiology  IR (PEG) Procedures:   PEG  2 D ECHO 11/18 Impressions:  - Echocardiogram strong suggestive of cardiac amyloidosis. Presence   of a pericardial effusion, thickened valves, thickened atrial   septum, and concentric  myocardial thickening, as well as RV wall   thickening. Normal LVEF 65-70%, no regional wall motion   abnormalities. Moderate aortic valve regurgitation. Possible   dilation of the sinuses of Valsalva. Severely thickened mitral   valve leaflets with trivial regurgitation. Moderate LA   enlargement. Moderate pericardial effusion without current   features of tamponade physiology.  Recommendations:  If clinically indicated: 1) consider evaluation of cardiac amyloidosis with serum and urine protein electrophoresis with immunofixation, cardiac MRI and/or nuclear medicine PYP study. 2) consider cardiac CT or cardiac MR to better evaluate aortic sinus of Valsalva possible dilation. Antimicrobials:  Anti-infectives (From admission, onward)   Start     Dose/Rate Route Frequency Ordered Stop   12/25/17 1200  amoxicillin-clavulanate (AUGMENTIN) 250-62.5 MG/5ML suspension 500 mg     500 mg Per Tube Every 12 hours 12/25/17 1146 12/27/17 2159   12/25/17 1145  amoxicillin-clavulanate (AUGMENTIN) 875-125 MG per tablet 1 tablet  Status:  Discontinued     1 tablet Oral Every 12 hours 12/25/17 1138 12/25/17 1142   12/25/17 1145  amoxicillin-clavulanate (AUGMENTIN) 875-125 MG per tablet 1 tablet  Status:  Discontinued    Note to Pharmacy:  Can change to liquid if needed. Dosing can be modified per pharmacy.   1 tablet Oral Every 12 hours 12/25/17 1142 12/25/17 1146   11/28/17 0915  ceFAZolin (ANCEF) IVPB 1 g/50 mL premix     1 g 100 mL/hr over 30 Minutes Intravenous To Radiology 11/28/17 0907 11/29/17 0915   11/28/17 0847  ceFAZolin (ANCEF) 2-4 GM/100ML-% IVPB    Note to Pharmacy:  Domenica Fail   : cabinet override      11/28/17 0847 11/28/17 1046   11/28/17 0845  ceFAZolin (ANCEF) powder 1 g  Status:  Discontinued     1 g Other To Surgery 11/28/17 0830 11/28/17 0907   10/23/17 0830  cefTRIAXone (ROCEPHIN) 1 g in sodium chloride 0.9 % 100 mL IVPB  Status:  Discontinued     1 g 200 mL/hr over 30  Minutes Intravenous Daily 10/23/17 0721 10/26/17 1400       Objective: Vitals:   12/25/17 2122 12/26/17 0615 12/26/17 0656 12/26/17 0826  BP: 134/73 129/65 (!) 100/52 (!) 106/57  Pulse: (!) 112 (!) 110 (!) 102 90  Resp: (!) 22 20 (!) 21 18  Temp: 98.3 F (36.8 C) 98.6 F (37 C) 98.4 F (36.9 C) 98.3 F (36.8 C)  TempSrc: Oral Oral  Oral  SpO2: 95% 94% 92% (!) 88%  Weight:      Height:        Intake/Output Summary (Last 24 hours) at 12/26/2017 1220 Last data filed at 12/26/2017 1045 Gross per 24 hour  Intake 1177 ml  Output 125 ml  Net 1052 ml   Filed Weights   12/18/17 2210 12/19/17 0500 12/23/17 2147  Weight: 46.9 kg 46.9 kg 45.5 kg    Examination: General exam: Appears comfortable - very cachectic, trembles on an off Respiratory system:poor air entry in right lung field- pulse ox  Cardiovascular system: S1 & S2 heard,  No murmurs  Gastrointestinal system: Abdomen soft, non-tender, nondistended. Normal  bowel sound. No organomegaly Extremities: No cyanosis, clubbing or edema Skin: No rashes or ulcers Psychiatry: very anxious appearing     Data Reviewed: I have personally reviewed following labs and imaging studies  CBC: Recent Labs  Lab 12/23/17 0440 12/25/17 0740 12/26/17 0404  WBC 12.6* 14.0* 12.3*  HGB 8.8* 10.5* 9.7*  HCT 27.9* 34.3* 30.8*  MCV 94.9 97.2 96.0  PLT 320 563* 332   Basic Metabolic Panel: Recent Labs  Lab 12/23/17 0440 12/24/17 0423 12/25/17 0600 12/26/17 0404  NA 140 141 140 138  K 2.7* 3.9 4.6 3.8  CL 97* 102 103 102  CO2 31 26 27 23   GLUCOSE 121* 109* 108* 231*  BUN 58* 62* 42* 59*  CREATININE 0.54 0.62 0.43* 0.56  CALCIUM 9.2 9.5 9.7 8.9  MG 2.0  --  2.0  --   PHOS 2.4*  --  3.4  --    GFR: Estimated Creatinine Clearance: 49.7 mL/min (by C-G formula based on SCr of 0.56 mg/dL). Liver Function Tests: No results for input(s): AST, ALT, ALKPHOS, BILITOT, PROT, ALBUMIN in the last 168 hours. No results for input(s):  LIPASE, AMYLASE in the last 168 hours. No results for input(s): AMMONIA in the last 168 hours. Coagulation Profile: No results for input(s): INR, PROTIME in the last 168 hours. Cardiac Enzymes: No results for input(s): CKTOTAL, CKMB, CKMBINDEX, TROPONINI in the last 168 hours. BNP (last 3 results) No results for input(s): PROBNP in the last 8760 hours. HbA1C: No results for input(s): HGBA1C in the last 72 hours. CBG: No results for input(s): GLUCAP in the last 168 hours. Lipid Profile: No results for input(s): CHOL, HDL, LDLCALC, TRIG, CHOLHDL, LDLDIRECT in the last 72 hours. Thyroid Function Tests: No results for input(s): TSH, T4TOTAL, FREET4, T3FREE, THYROIDAB in the last 72 hours. Anemia Panel: No results for input(s): VITAMINB12, FOLATE, FERRITIN, TIBC, IRON, RETICCTPCT in the last 72 hours. Urine analysis:    Component Value Date/Time   COLORURINE AMBER (A) 10/23/2017 0106   APPEARANCEUR CLOUDY (A) 10/23/2017 0106   LABSPEC 1.009 10/23/2017 0106   PHURINE 9.0 (H) 10/23/2017 0106   GLUCOSEU NEGATIVE 10/23/2017 0106   HGBUR MODERATE (A) 10/23/2017 0106   BILIRUBINUR NEGATIVE 10/23/2017 0106   KETONESUR NEGATIVE 10/23/2017 0106   PROTEINUR NEGATIVE 10/23/2017 0106   UROBILINOGEN 0.2 12/15/2006 1654   NITRITE NEGATIVE 10/23/2017 0106   LEUKOCYTESUR MODERATE (A) 10/23/2017 0106   Sepsis Labs: @LABRCNTIP (procalcitonin:4,lacticidven:4) )No results found for this or any previous visit (from the past 240 hour(s)).       Radiology Studies: Dg Chest Port 1 View  Result Date: 12/25/2017 CLINICAL DATA:  Cough. EXAM: PORTABLE CHEST 1 VIEW COMPARISON:  October 05, 2017 FINDINGS: Right basilar infiltrate. The heart, hila, mediastinum, lungs, and pleura are otherwise unremarkable. IMPRESSION: Right lower lobe infiltrate, likely pneumonia. Recommend follow-up to resolution. Electronically Signed   By: Gerome Samavid  Williams III M.D   On: 12/25/2017 09:30      Scheduled Meds: .  amoxicillin-clavulanate  500 mg Per Tube Q12H  . chlorhexidine  15 mL Mouth Rinse BID  . collagenase   Topical Daily  . enoxaparin (LOVENOX) injection  30 mg Subcutaneous Q24H  . feeding supplement (OSMOLITE 1.5 CAL)  237 mL Per Tube 5 X Daily  . feeding supplement (PRO-STAT SUGAR FREE 64)  30 mL Per Tube Daily  . ferrous sulfate  325 mg Oral Q breakfast  . fluvoxaMINE  150 mg Per Tube QHS  . free water  350 mL  Per Tube Q6H  . magnesium oxide  400 mg Per Tube BID  . mouth rinse  15 mL Mouth Rinse q12n4p  . megestrol  400 mg Per Tube Daily  . mirtazapine  45 mg Per Tube QHS  . nutrition supplement (JUVEN)  1 packet Per Tube BID  . OLANZapine zydis  20 mg Sublingual QHS  . phosphorus  500 mg Oral TID WC  . sennosides  5 mL Per Tube QHS  . simvastatin  10 mg Per Tube q1800  . sodium chloride flush  3 mL Intravenous Q12H  . thiamine  100 mg Oral Daily  . vitamin C  250 mg Per Tube BID   Continuous Infusions:   LOS: 82 days    Time spent in minutes: 40 min Extensive chart review performed today as she has been hospitalized > 1 month and is a new patient to me    Calvert Cantor, MD [facility] Pager: [email or web address] Password [email or web address] 12/26/2017, 12:20 PM

## 2017-12-26 NOTE — Progress Notes (Signed)
Report called and given to BurgoonNadine, RN on 2W. Patient transported to 2W22.

## 2017-12-26 NOTE — Progress Notes (Signed)
Patient O2 sats are 88% on RA. Patient placed on 2L oxygen and O2 sats are remaining in the 80's. Patient then placed on 6L of oxygen and O2 sats remained at 87%. Vital signs obtained at 1149. Temperature was 98.5, Pulse was 133, B/P was 110/84. MD notified. Rizwan, MD placed orders for CXR and ABG's. Respiratory Therapist called and updated of this information. Rapid Response called and at bedside. ABG's and CXR obtained. NRB placed on patient and O2 sats were 88%.  MD notified of lab results. MD at bedside and placed order to transfer patient to progressive care, place patient of cardiac monitoring, and placed orders for antibiotics. Orders followed. Awaiting bed placement for patient. Will continue to monitor.

## 2017-12-26 NOTE — Clinical Social Work Note (Addendum)
CSW received call from [place] with [facility]. She is coming tomorrow to see patient. CSW will continue to follow and facilitate discharge to SNF [facility] once [facility] received.  Genelle BalVanessa Sharniece Gibbon, MSW, LCSW Licensed Clinical Social Worker Clinical Social Work Department [facility] [phone]

## 2017-12-26 NOTE — Progress Notes (Signed)
Patient transported from 5M11 to 2W22 on Bipap 12/6, FiO2 100%.  Patient tolerated well.

## 2017-12-27 DIAGNOSIS — Z66 Do not resuscitate: Secondary | ICD-10-CM

## 2017-12-27 DIAGNOSIS — J189 Pneumonia, unspecified organism: Secondary | ICD-10-CM

## 2017-12-27 DIAGNOSIS — Z515 Encounter for palliative care: Secondary | ICD-10-CM

## 2017-12-27 DIAGNOSIS — R627 Adult failure to thrive: Secondary | ICD-10-CM

## 2017-12-27 LAB — MRSA PCR SCREENING: MRSA by PCR: POSITIVE — AB

## 2017-12-27 LAB — CBC
HCT: 26.8 % — ABNORMAL LOW (ref 36.0–46.0)
Hemoglobin: 8.7 g/dL — ABNORMAL LOW (ref 12.0–15.0)
MCH: 30.1 pg (ref 26.0–34.0)
MCHC: 32.5 g/dL (ref 30.0–36.0)
MCV: 92.7 fL (ref 80.0–100.0)
Platelets: 331 10*3/uL (ref 150–400)
RBC: 2.89 MIL/uL — ABNORMAL LOW (ref 3.87–5.11)
RDW: 14 % (ref 11.5–15.5)
WBC: 12.9 10*3/uL — ABNORMAL HIGH (ref 4.0–10.5)
nRBC: 0 % (ref 0.0–0.2)

## 2017-12-27 LAB — BASIC METABOLIC PANEL
Anion gap: 11 (ref 5–15)
BUN: 75 mg/dL — ABNORMAL HIGH (ref 8–23)
CALCIUM: 9.4 mg/dL (ref 8.9–10.3)
CO2: 28 mmol/L (ref 22–32)
Chloride: 98 mmol/L (ref 98–111)
Creatinine, Ser: 0.72 mg/dL (ref 0.44–1.00)
GFR calc Af Amer: >60 mL/min (ref >60)
Glucose, Bld: 158 mg/dL — ABNORMAL HIGH (ref 70–99)
Potassium: 3.7 mmol/L (ref 3.5–5.1)
Sodium: 137 mmol/L (ref 135–145)

## 2017-12-27 LAB — MAGNESIUM: MAGNESIUM: 1.9 mg/dL (ref 1.7–2.4)

## 2017-12-27 LAB — PHOSPHORUS: Phosphorus: 4 mg/dL (ref 2.5–4.6)

## 2017-12-27 MED ORDER — SODIUM CHLORIDE 0.9 % IV BOLUS: 1000.0000 mL | Freq: Once | INTRAVENOUS | Status: DC

## 2017-12-27 MED ORDER — OSMOLITE 1.5 CAL PO LIQD
237.0000 mL | Freq: Two times a day (BID) | ORAL | Status: DC
  Administered 2017-12-27 – 2017-12-28 (×2): 237 mL
  Filled 2017-12-27 (×2): qty 237

## 2017-12-27 MED ORDER — FREE WATER
100.0000 mL | Freq: Three times a day (TID) | Status: DC
  Administered 2017-12-27 – 2017-12-29 (×5): 100 mL

## 2017-12-27 NOTE — Progress Notes (Signed)
PT Cancellation Note  Patient Details Name: Amanda Hall MRN: [id number] DOB: 23-Oct-1950   Cancelled Treatment:    Reason Eval/Treat Not Completed: Other (comment).  Pt is now full comfort care.  Palliative care has d/c PT orders.  PT to sign off.  Thanks,  Rollene Rotundaebecca B. Acadia Thammavong, PT, DPT  Acute Rehabilitation [phone] pager [phone] office     Amanda Hall 12/27/2017, 1:25 PM

## 2017-12-27 NOTE — Progress Notes (Signed)
Patient ID: Amanda Hall, female   DOB: 1950-05-24, 67 y.o.   MRN: [id number]  This NP visited patient at the bedside as a follow up for palliative  medicine needs and emotional support at attendings request.  Spoke with Dr. Butler Denmarkizwan regarding current situation.  Patient has a long complicated medical/psychiatric hospitalization.  Medical records reviewed.  Patient has declined in the past 24 hours secondary to acute on chronic respiratory failure, per CCM after discussion with family patient was made DNR and placed on BiPAP.  Primary team requesting palliative medicine to help in clarification of goals of care  I placed a call to her sister Amanda Hall/main support person and had a long conversation regarding the patient's current medical situation and hospitalization and also  her long struggle with psychiatric issues for most of her life.  ECT was offered in the past and " I would never want that for her"     I just want her to have "some peace and calm"  Created space and opportunity for sister to share her love and appreciation for Amanda Hall her sister , she speaks to [employer]ana's  creativity, and love of dance.   Her sister has not seen the patient for the entire hospitalization secondary to her own health problems.  However she verbalizes to me a clear understanding of the current medical situation as described to her by many providers during this hospitalization for which she is grateful.  She understands the poor prognosis.   Amanda Hall's/sister main focus of care at this time is that her sister "be comfortable and not suffer".  I detailed for Amanda HatchetSheila the details of a comfort path; no escalation of care, no further diagnostics, no further antibiotic use, no BiPAP, minimize  medications except those to promote comfort and begin to decrease artificial feeding and hydration allowing for natural death.  I shared with Amanda HatchetSheila that anything could happen at any time, Amanda HatchetSheila tells me that both she and her brother  do not feel the need to come to the hospital at this time.  Plan of care -DNR/DNI -Begin to wean artificial feeding and hydration -No further diagnostics -No antibiotic use -Medications to maximize comfort -Spiritual care support  Questions and concerns addressed   Discussed with Dr Butler Denmarkizwan and Dr Sharma CovertNorman PMT will continue to support holistically  Total time spent on the unit was 60 minutes  Greater than 50% of the time was spent in counseling and coordination of care  Amanda CreedMary Larach NP  Palliative Medicine Team Team Phone # [phone]- [phone] Pager [phone]

## 2017-12-27 NOTE — Progress Notes (Signed)
   12/27/17 1311  Clinical Encounter Type  Visited With Patient  Visit Type Follow-up  Referral From Palliative care team  Consult/Referral To Chaplain  The chaplain responded to PMT consult for Pt. prayer.  The Pt. exchanged a few words in response to the chaplain's questions.  The Pt. shared her dog's name-Mandy and their 416yr relationship together.  The chaplain proceeded with prayer and the Pt. ended with amen.  The Pt. is accepting of the chaplain returning for another visit.

## 2017-12-27 NOTE — Progress Notes (Signed)
PROGRESS NOTE    Amanda Hall   ZOX:[id number]  DOB: [birth year]  DOA: 10/05/2017 PCP: [name], MD   Brief Narrative:  Amanda Hall is a 67 yo female who has been hospitalized since 10/05/17.  She has a medical history significant for depression with anxiety and recent inpatient psychiatry admission, now presenting to the emergency department for evaluation of increased confusion, agitation, refusal to take her medications, and not eating or drinking.  Patient was admitted to inpatient psychiatry approximately 1 month ago and has been living with her sister since discharge.  She has reportedly been refusing her medications, not eating or drinking, and becoming increasingly confused and agitated. Sister took out IVC as she was not taking medications and was deteriorating.  She has been seen by psychiatry who has recommended inpatient psych placement again however she has been unable to get a bed. Due to her poor oral intake, she has received a PEG tube and is essentially bed bound and not able to adequately cooperate with PT. Psych has been treating her with oral medications (via PEG now). Re-evaluated by psych 12/9, it was felt that she may receive treatment for her depression faster if she was able to be discharged to home or SNF. She needed a SNF as she has become very deconditioned. SW stated it was difficult to find a bed for her and we were awaiting a bed.  She developed respiratory distress and a RLL pneumonia and had to be transferred to the SDU on 12/16. She was made a DNR and placed on BiPAP. Palliative care re consulted. After further discussion between palliative care and family, the patient has been transitioned to comfort care.   Subjective: Awake on BiPAP - no complaints.   Assessment & Plan:   Principal Problem: Severe anxiety and depression - which is debilitating to a point where she is not eating- now has a PEG and unable to walk due to severe deconditioning -  Cont  Zyprexa ,Luvox , Remeron,  Klonopin 0.5 mg TID PRN - psych has been following intermittently - she was IVC' but has not had a bed and has been in the hospital since 9/25 - per psych, she will likely receive faster treatment if she was to be discharged and now recommending outpt psych with Dr Neila Gear- this will need to be arranged once we know when she is leaving the hospital - we are awaiting SNF placement which case management is working on - 12/ 17 > now comfort care- on medications for comfort    Active Problems:  RLL pneumonia - 12/25> coughing up yellow sputum, WBC steadily rising- obtained CXR which shows a RLL infiltrate - suspect aspiration of PEG tube feeds or oral secretions - started Augmentin x 5 days - ordered aspiration precautions -  - 12/16- severely hypoxic -  CXR show extensive right sided infiltrate- she is unable to tell me if she wants to be intubated- spoke with sister who is unsure- no on has POA- will put her on BiPAP for now and consult pulmonary- PCCM called for consult- - 12/17- antibiotics discontinued- able to be weaned down to 2 L today-  Pule ox 97%     Dysphagia, very poor oral intake,  Severe malnutrition - in setting of severe depression and anxiety - s/p PEG during this hospital stay  Hypokalemia, hypophosphatemia, hypomagnesemia -likely re-feeding syndrome after PEG - replacing and following labs- cont K phos and add additional K as needed   Acute kidney  injury -Likely due to dehydration, anorexia, severe protein calorie malnutrition - resolved  Cardiac amyloidosis? - Suspected on Transthoracic Echocardiogram - cardiology consulted 11/6 - no heart failure noted-  r psych issues and overall malnutrition and bed bound state are more imminent issues- if she is to recuperate from these, further cardiac work up can be obtained  - 12/17> now comfort care  Severe deconditioning - unable to ambulate  Iron deficiency anemia Pressure injury  of skin   DVT prophylaxis: Lovenox Code Status: Full code Disposition Plan: SW working on SNF bed Consultants:   Psych  Palliative care  Cardiology  IR (PEG) Procedures:   PEG  2 D ECHO 11/18 Impressions:  - Echocardiogram strong suggestive of cardiac amyloidosis. Presence   of a pericardial effusion, thickened valves, thickened atrial   septum, and concentric myocardial thickening, as well as RV wall   thickening. Normal LVEF 65-70%, no regional wall motion   abnormalities. Moderate aortic valve regurgitation. Possible   dilation of the sinuses of Valsalva. Severely thickened mitral   valve leaflets with trivial regurgitation. Moderate LA   enlargement. Moderate pericardial effusion without current   features of tamponade physiology.  Recommendations:  If clinically indicated: 1) consider evaluation of cardiac amyloidosis with serum and urine protein electrophoresis with immunofixation, cardiac MRI and/or nuclear medicine PYP study. 2) consider cardiac CT or cardiac MR to better evaluate aortic sinus of Valsalva possible dilation. Antimicrobials:  Anti-infectives (From admission, onward)   Start     Dose/Rate Route Frequency Ordered Stop   12/27/17 1800  vancomycin (VANCOCIN) IVPB 750 mg/150 ml premix  Status:  Discontinued     750 mg 150 mL/hr over 60 Minutes Intravenous Every 24 hours 12/26/17 1505 12/27/17 1045   12/26/17 1600  ceFEPIme (MAXIPIME) 1 g in sodium chloride 0.9 % 100 mL IVPB  Status:  Discontinued     1 g 200 mL/hr over 30 Minutes Intravenous Every 12 hours 12/26/17 1505 12/27/17 1045   12/26/17 1600  vancomycin (VANCOCIN) IVPB 1000 mg/200 mL premix     1,000 mg 200 mL/hr over 60 Minutes Intravenous  Once 12/26/17 1505 12/26/17 1914   12/25/17 1200  amoxicillin-clavulanate (AUGMENTIN) 250-62.5 MG/5ML suspension 500 mg  Status:  Discontinued     500 mg Per Tube Every 12 hours 12/25/17 1146 12/26/17 1425   12/25/17 1145  amoxicillin-clavulanate  (AUGMENTIN) 875-125 MG per tablet 1 tablet  Status:  Discontinued     1 tablet Oral Every 12 hours 12/25/17 1138 12/25/17 1142   12/25/17 1145  amoxicillin-clavulanate (AUGMENTIN) 875-125 MG per tablet 1 tablet  Status:  Discontinued    Note to Pharmacy:  Can change to liquid if needed. Dosing can be modified per pharmacy.   1 tablet Oral Every 12 hours 12/25/17 1142 12/25/17 1146   11/28/17 0915  ceFAZolin (ANCEF) IVPB 1 g/50 mL premix     1 g 100 mL/hr over 30 Minutes Intravenous To Radiology 11/28/17 0907 11/29/17 0915   11/28/17 0847  ceFAZolin (ANCEF) 2-4 GM/100ML-% IVPB    Note to Pharmacy:  Domenica Fail   : cabinet override      11/28/17 0847 11/28/17 1046   11/28/17 0845  ceFAZolin (ANCEF) powder 1 g  Status:  Discontinued     1 g Other To Surgery 11/28/17 0830 11/28/17 0907   10/23/17 0830  cefTRIAXone (ROCEPHIN) 1 g in sodium chloride 0.9 % 100 mL IVPB  Status:  Discontinued     1 g 200 mL/hr over 30 Minutes  Intravenous Daily 10/23/17 0721 10/26/17 1400       Objective: Vitals:   12/27/17 0739 12/27/17 0745 12/27/17 0843 12/27/17 1155  BP:   118/65 104/62  Pulse: (!) 110  (!) 111 (!) 112  Resp: (!) 24  (!) 24 (!) 22  Temp:   98.8 F (37.1 C) 98.9 F (37.2 C)  TempSrc:   Oral Oral  SpO2: 100% 100% 100% 97%  Weight:      Height:        Intake/Output Summary (Last 24 hours) at 12/27/2017 1216 Last data filed at 12/27/2017 0900 Gross per 24 hour  Intake 2232.8 ml  Output 150 ml  Net 2082.8 ml   Filed Weights   12/18/17 2210 12/19/17 0500 12/23/17 2147  Weight: 46.9 kg 46.9 kg 45.5 kg    Examination: General exam: Appears comfortable - severely cachectic Respiratory system: rhonchi, RR in 20s- on BiPAP Cardiovascular system: S1 & S2 heard,  No murmurs  Gastrointestinal system: Abdomen soft, non-tender, nondistended. Normal bowel sound BEG in place Central nervous system: difficult to assess Extremities: No cyanosis, clubbing or edema    Data Reviewed: I  have personally reviewed following labs and imaging studies  CBC: Recent Labs  Lab 12/23/17 0440 12/25/17 0740 12/26/17 0404 12/27/17 0257  WBC 12.6* 14.0* 12.3* 12.9*  HGB 8.8* 10.5* 9.7* 8.7*  HCT 27.9* 34.3* 30.8* 26.8*  MCV 94.9 97.2 96.0 92.7  PLT 320 563* 332 331   Basic Metabolic Panel: Recent Labs  Lab 12/23/17 0440 12/24/17 0423 12/25/17 0600 12/26/17 0404 12/27/17 0257  NA 140 141 140 138 137  K 2.7* 3.9 4.6 3.8 3.7  CL 97* 102 103 102 98  CO2 31 26 27 23 28   GLUCOSE 121* 109* 108* 231* 158*  BUN 58* 62* 42* 59* 75*  CREATININE 0.54 0.62 0.43* 0.56 0.72  CALCIUM 9.2 9.5 9.7 8.9 9.4  MG 2.0  --  2.0  --  1.9  PHOS 2.4*  --  3.4  --  4.0   GFR: Estimated Creatinine Clearance: 49.7 mL/min (by C-G formula based on SCr of 0.72 mg/dL). Liver Function Tests: No results for input(s): AST, ALT, ALKPHOS, BILITOT, PROT, ALBUMIN in the last 168 hours. No results for input(s): LIPASE, AMYLASE in the last 168 hours. No results for input(s): AMMONIA in the last 168 hours. Coagulation Profile: No results for input(s): INR, PROTIME in the last 168 hours. Cardiac Enzymes: No results for input(s): CKTOTAL, CKMB, CKMBINDEX, TROPONINI in the last 168 hours. BNP (last 3 results) No results for input(s): PROBNP in the last 8760 hours. HbA1C: No results for input(s): HGBA1C in the last 72 hours. CBG: Recent Labs  Lab 12/26/17 1329  GLUCAP 212*   Lipid Profile: No results for input(s): CHOL, HDL, LDLCALC, TRIG, CHOLHDL, LDLDIRECT in the last 72 hours. Thyroid Function Tests: No results for input(s): TSH, T4TOTAL, FREET4, T3FREE, THYROIDAB in the last 72 hours. Anemia Panel: No results for input(s): VITAMINB12, FOLATE, FERRITIN, TIBC, IRON, RETICCTPCT in the last 72 hours. Urine analysis:    Component Value Date/Time   COLORURINE AMBER (A) 10/23/2017 0106   APPEARANCEUR CLOUDY (A) 10/23/2017 0106   LABSPEC 1.009 10/23/2017 0106   PHURINE 9.0 (H) 10/23/2017 0106    GLUCOSEU NEGATIVE 10/23/2017 0106   HGBUR MODERATE (A) 10/23/2017 0106   BILIRUBINUR NEGATIVE 10/23/2017 0106   KETONESUR NEGATIVE 10/23/2017 0106   PROTEINUR NEGATIVE 10/23/2017 0106   UROBILINOGEN 0.2 12/15/2006 1654   NITRITE NEGATIVE 10/23/2017 0106   LEUKOCYTESUR  MODERATE (A) 10/23/2017 0106   Sepsis Labs: @LABRCNTIP (procalcitonin:4,lacticidven:4) ) Recent Results (from the past 240 hour(s))  MRSA PCR Screening     Status: Abnormal   Collection Time: 12/27/17 12:35 AM  Result Value Ref Range Status   MRSA by PCR POSITIVE (A) NEGATIVE Final    Comment:        The GeneXpert MRSA Assay (FDA approved for NASAL specimens only), is one component of a comprehensive MRSA colonization surveillance program. It is not intended to diagnose MRSA infection nor to guide or monitor treatment for MRSA infections. RESULT CALLED TO, READ BACK BY AND VERIFIED WITH: D OWEN RN 0300 12/27/17 A BROWNING Performed at [facility], [address] [place], [place] [address]          Radiology Studies: Dg Chest Port 1 View  Result Date: 12/26/2017 CLINICAL DATA:  Pneumonia EXAM: PORTABLE CHEST 1 VIEW COMPARISON:  12/25/2017 FINDINGS: Extensive and progressive airspace disease on the right. Milder airspace opacity at the left base. Normal heart size. No effusion or cavitation seen. Presumed T tack over the left upper quadrant IMPRESSION: Significant worsening of pneumonia that is more extensive on the right. Electronically Signed   By: Marnee Spring M.D.   On: 12/26/2017 13:54      Scheduled Meds: . chlorhexidine  15 mL Mouth Rinse BID  . collagenase   Topical Daily  . enoxaparin (LOVENOX) injection  30 mg Subcutaneous Q24H  . feeding supplement (OSMOLITE 1.5 CAL)  237 mL Per Tube BID  . fluvoxaMINE  150 mg Per Tube QHS  . free water  100 mL Per Tube Q8H  . mouth rinse  15 mL Mouth Rinse q12n4p  . mirtazapine  45 mg Per Tube QHS  . OLANZapine zydis  20 mg Sublingual QHS  .  sennosides  5 mL Per Tube QHS  . sodium chloride flush  3 mL Intravenous Q12H   Continuous Infusions:   LOS: 83 days    Time spent in minutes: 30 min      Calvert Cantor, MD [facility] Pager: [email or web address] Password [email or web address] 12/27/2017, 12:16 PM

## 2017-12-27 NOTE — Progress Notes (Signed)
Nutrition Brief Note  Chart reviewed. Pt now transitioning to comfort care.  Spoke with Lorinda CreedMary Larach, NP with Palliative Medicine Team.  Bolus TF (Osmolite 1.5 formula) order tapered to 237 ml BID via PEG tube. No further nutrition interventions warranted at this time.  Please re-consult as needed.   Maureen ChattersKatie Conal Shetley, RD, LDN Pager #: [phone] After-Hours Pager #: [phone]

## 2017-12-28 DIAGNOSIS — J690 Pneumonitis due to inhalation of food and vomit: Secondary | ICD-10-CM

## 2017-12-28 LAB — GLUCOSE, CAPILLARY: GLUCOSE-CAPILLARY: 144 mg/dL — AB (ref 70–99)

## 2017-12-28 NOTE — Progress Notes (Signed)
PROGRESS NOTE    Amanda Hall  UJW:[id number] DOB: 18-Aug-1950 DOA: 10/05/2017 PCP: [name], MD   Brief Narrative:  67 year old who has been hospitalized here since 10/05/2017 has a history of anxiety and inpatient psych admission came to the hospital with increasing confusion, agitation and refusing taking p.o. on oral medication.  During the hospitalization she was divided by psychiatry recommended inpatient psych placement but due to poor p.o. intake PEG tube was placed and started on tube feeding.  Patient has been awaiting skilled nursing facility for a long time but overall continue became very deconditioned.  During the hospital course she developed respiratory distress and right lower lobe pneumonia recently requiring antibiotic.  She was made DNR and placed on BiPAP.  Palliative care consulted and patient was transitioned to comfort care on 12/27/2016.   Assessment & Plan:   Principal Problem:   MDD (major depressive disorder), recurrent severe, without psychosis (HCC) Active Problems:   MDD (major depressive disorder)   Anorexia   Dysphagia   Severe malnutrition (HCC)   Anxiety   Pressure injury of skin   Aspiration pneumonia (HCC)   Acute respiratory failure with hypoxia (HCC)   Failure to thrive in adult   Pneumonia due to infectious organism   Palliative care by specialist   DNR (do not resuscitate)  Sepsis due to right lower lobe pneumonia Dysphagia with very poor oral intake-severe protein calorie malnutrition - After discussion with palliative care has been decided to make patient comfort care without any use of antibiotics. -Currently PEG tube is in place but I would advise to hold off on this if you are exploring comfort care option.  Palliative care team is following. -Routine aspiration precaution  Hypokalemia/hypophosphatemia and hypomagnesemia - Improving.  Concerns for refeeding syndrome.  Replete as necessary  Severe anxiety and depression -  Currently patient is on Zyprexa, Luvox, Remeron, Klonopin.  Psych is following.  Cardiac amyloid? -This was suspected on echocardiogram.  But hold off on further investigation as patient is now comfort care.  Severe deconditioning and generalized weakness -Patient is bedbound, does not ambulate  Multiple pressure related skin injury Iron deficiency anemia  Hold off on any further diagnostic imaging  DVT prophylaxis: Lovenox Code Status: DNR/DNI Family Communication: None at bedside Disposition Plan: Currently comfort care, palliative care team following.  Hopefully we can transition her out of the hospital over the next 24-48 hours depending on how rapidly she declines after withholding PEG tube feeding  Consultants:   Palliative care team  Psychiatry  Cardiology  IR  Procedures:   PEG tube  Echocardiogram  Antimicrobials:   None at this time   Subjective: Patient is laying in the bed answering only minimal basic questions but overall denies any complaints.   Objective: Vitals:   12/27/17 1640 12/27/17 2324 12/27/17 2330 12/28/17 0821  BP: 101/69 (!) 114/56  129/68  Pulse: 98 (!) 123  (!) 115  Resp: 20 20 18    Temp: 98.9 F (37.2 C) 98.1 F (36.7 C)  98 F (36.7 C)  TempSrc: Oral Oral  Oral  SpO2: 98% (!) 89% 90% 92%  Weight:      Height:        Intake/Output Summary (Last 24 hours) at 12/28/2017 1129 Last data filed at 12/28/2017 0900 Gross per 24 hour  Intake 597 ml  Output 400 ml  Net 197 ml   Filed Weights   12/18/17 2210 12/19/17 0500 12/23/17 2147  Weight: 46.9 kg 46.9 kg 45.5  kg    Examination:  General exam: Laying in the bed, very cachectic with bilateral temporal wasting Respiratory system: Diminished breath sounds at the bases Cardiovascular system: S1 & S2 heard, RRR. No JVD, murmurs, rubs, gallops or clicks. No pedal edema. Gastrointestinal system: Abdomen is nondistended, soft and nontender. No organomegaly or masses felt.  Normal bowel sounds heard. Central nervous system: Difficult to assess, answers basic questions. Extremities: 3/5 in all 4 extremities Skin: No rashes, lesions or ulcers Psychiatry: Judgement and insight appear normal. Mood & affect appropriate.     Data Reviewed:   CBC: Recent Labs  Lab 12/23/17 0440 12/25/17 0740 12/26/17 0404 12/27/17 0257  WBC 12.6* 14.0* 12.3* 12.9*  HGB 8.8* 10.5* 9.7* 8.7*  HCT 27.9* 34.3* 30.8* 26.8*  MCV 94.9 97.2 96.0 92.7  PLT 320 563* 332 331   Basic Metabolic Panel: Recent Labs  Lab 12/23/17 0440 12/24/17 0423 12/25/17 0600 12/26/17 0404 12/27/17 0257  NA 140 141 140 138 137  K 2.7* 3.9 4.6 3.8 3.7  CL 97* 102 103 102 98  CO2 31 26 27 23 28   GLUCOSE 121* 109* 108* 231* 158*  BUN 58* 62* 42* 59* 75*  CREATININE 0.54 0.62 0.43* 0.56 0.72  CALCIUM 9.2 9.5 9.7 8.9 9.4  MG 2.0  --  2.0  --  1.9  PHOS 2.4*  --  3.4  --  4.0   GFR: Estimated Creatinine Clearance: 49.7 mL/min (by C-G formula based on SCr of 0.72 mg/dL). Liver Function Tests: No results for input(s): AST, ALT, ALKPHOS, BILITOT, PROT, ALBUMIN in the last 168 hours. No results for input(s): LIPASE, AMYLASE in the last 168 hours. No results for input(s): AMMONIA in the last 168 hours. Coagulation Profile: No results for input(s): INR, PROTIME in the last 168 hours. Cardiac Enzymes: No results for input(s): CKTOTAL, CKMB, CKMBINDEX, TROPONINI in the last 168 hours. BNP (last 3 results) No results for input(s): PROBNP in the last 8760 hours. HbA1C: No results for input(s): HGBA1C in the last 72 hours. CBG: Recent Labs  Lab 12/26/17 1329 12/28/17 0819  GLUCAP 212* 144*   Lipid Profile: No results for input(s): CHOL, HDL, LDLCALC, TRIG, CHOLHDL, LDLDIRECT in the last 72 hours. Thyroid Function Tests: No results for input(s): TSH, T4TOTAL, FREET4, T3FREE, THYROIDAB in the last 72 hours. Anemia Panel: No results for input(s): VITAMINB12, FOLATE, FERRITIN, TIBC, IRON,  RETICCTPCT in the last 72 hours. Sepsis Labs: Recent Labs  Lab 12/26/17 1432 12/26/17 1832  LATICACIDVEN 1.4 1.4    Recent Results (from the past 240 hour(s))  MRSA PCR Screening     Status: Abnormal   Collection Time: 12/27/17 12:35 AM  Result Value Ref Range Status   MRSA by PCR POSITIVE (A) NEGATIVE Final    Comment:        The GeneXpert MRSA Assay (FDA approved for NASAL specimens only), is one component of a comprehensive MRSA colonization surveillance program. It is not intended to diagnose MRSA infection nor to guide or monitor treatment for MRSA infections. RESULT CALLED TO, READ BACK BY AND VERIFIED WITH: D OWEN RN 0300 12/27/17 A BROWNING Performed at [facility], [address] [place], [place] [address]          Radiology Studies: Dg Chest Port 1 View  Result Date: 12/26/2017 CLINICAL DATA:  Pneumonia EXAM: PORTABLE CHEST 1 VIEW COMPARISON:  12/25/2017 FINDINGS: Extensive and progressive airspace disease on the right. Milder airspace opacity at the left base. Normal heart size.  No effusion or cavitation seen. Presumed T tack over the left upper quadrant IMPRESSION: Significant worsening of pneumonia that is more extensive on the right. Electronically Signed   By: Marnee Spring M.D.   On: 12/26/2017 13:54        Scheduled Meds: . chlorhexidine  15 mL Mouth Rinse BID  . collagenase   Topical Daily  . enoxaparin (LOVENOX) injection  30 mg Subcutaneous Q24H  . feeding supplement (OSMOLITE 1.5 CAL)  237 mL Per Tube BID  . fluvoxaMINE  150 mg Per Tube QHS  . free water  100 mL Per Tube Q8H  . mouth rinse  15 mL Mouth Rinse q12n4p  . mirtazapine  45 mg Per Tube QHS  . OLANZapine zydis  20 mg Sublingual QHS  . sennosides  5 mL Per Tube QHS  . sodium chloride flush  3 mL Intravenous Q12H   Continuous Infusions:   LOS: 84 days   Time spent= 25 mins    Elizebeth Kluesner Joline Maxcy, MD [facility] Pager [phone]   If 7PM-7AM, please  contact night-coverage [email or web address] Password [email or web address] 12/28/2017, 11:29 AM

## 2017-12-28 NOTE — Consult Note (Signed)
WOC Nurse wound follow up Wound type:unstagable HAPI to sacrum Measurement:8cm x 5.5cm x unknown depth Wound bed:100% black surrounded by DTI Drainage (amount, consistency, odor) scant, malodorous Periwound: DTI surrounding wound Dressing procedure/placement/frequency: Wound has deteriorated this week. Pt is a DNR and bedside RN states pt is now comfort care. Will make MD aware of changes and ask if he would like Hydrotherapy to assess or just continue conservative treatment. WOC will not follow at this point since pt is comfort care. If assistance is needed, please re-consult. Barnett HatterMelinda Adelai Achey, RN-C, WTA-C, O[facility] Wound Treatment Associate Ostomy Care Associate

## 2017-12-28 NOTE — Progress Notes (Signed)
Daily Progress Note   Patient Name: Amanda Hall       Date: 12/28/2017 DOB: 10/08/1950  Age: 67 y.o. MRN#: [id number] Attending Physician: Dimple Nanas, MD Primary Care Physician: [name], MD Admit Date: 10/05/2017  Reason for Consultation/Follow-up: Establishing goals of care  Subjective: I saw and examined patient today.  She is lying in bed.  Answers some questions appropriately, but not really able to hold conversation.  I called and discussed with her sister.  See below.  Length of Stay: 42  Current Medications: Scheduled Meds:  . chlorhexidine  15 mL Mouth Rinse BID  . collagenase   Topical Daily  . enoxaparin (LOVENOX) injection  30 mg Subcutaneous Q24H  . fluvoxaMINE  150 mg Per Tube QHS  . free water  100 mL Per Tube Q8H  . mouth rinse  15 mL Mouth Rinse q12n4p  . mirtazapine  45 mg Per Tube QHS  . OLANZapine zydis  20 mg Sublingual QHS  . sennosides  5 mL Per Tube QHS  . sodium chloride flush  3 mL Intravenous Q12H    Continuous Infusions:   PRN Meds: acetaminophen **OR** acetaminophen, clonazePAM, HYDROcodone-acetaminophen, loperamide, morphine injection, naphazoline-glycerin, ondansetron **OR** ondansetron (ZOFRAN) IV  Physical Exam      General: Lying in bed, cachectic, decreased interaction Heart: Regular rate and rhythm. No murmur appreciated. Lungs: Diminished air movement Abdomen: Soft, nontender, nondistended Ext: No significant edema, significant muscle wasting noted Skin: Warm and dry Neuro: Grossly intact, nonfocal.     Vital Signs: BP 129/68 (BP Location: Left Arm)   Pulse (!) 115   Temp 98 F (36.7 C) (Oral)   Resp 18   Ht 5\' 2"  (1.575 m)   Wt 45.5 kg   SpO2 92%   BMI 18.35 kg/m  SpO2: SpO2: 92 % O2 Device: O2 Device:  Room Air O2 Flow Rate: O2 Flow Rate (L/min): 1 L/min  Intake/output summary:   Intake/Output Summary (Last 24 hours) at 12/28/2017 1145 Last data filed at 12/28/2017 0900 Gross per 24 hour  Intake 597 ml  Output 400 ml  Net 197 ml   LBM: Last BM Date: 12/26/17 Baseline Weight: Weight: 45.4 kg Most recent weight: Weight: 45.5 kg       Palliative Assessment/Data:    Flowsheet Rows  Most Recent Value  Intake Tab  Referral Department  Hospitalist  Unit at Time of Referral  Med/Surg Unit  Palliative Care Primary Diagnosis  Other (Comment) [Psych]  Date Notified  11/18/17  Palliative Care Type  New Palliative care  Reason Not Seen  -- [Inappropriate referral]  Reason for referral  Clarify Goals of Care  Date of Admission  10/05/17  Date first seen by Palliative Care  11/21/17  # of days Palliative referral response time  3 Day(s)  # of days IP prior to Palliative referral  44  Clinical Assessment  Palliative Performance Scale Score  20%  Psychosocial & Spiritual Assessment  Palliative Care Outcomes  Patient/Family meeting held?  No      Patient Active Problem List   Diagnosis Date Noted  . Failure to thrive in adult   . Pneumonia due to infectious organism   . Palliative care by specialist   . DNR (do not resuscitate)   . Acute respiratory failure with hypoxia (HCC)   . Aspiration pneumonia (HCC)   . Pressure injury of skin 11/22/2017  . Anxiety 10/17/2017  . Dysphagia 10/12/2017  . Severe malnutrition (HCC) 10/12/2017  . MDD (major depressive disorder), recurrent severe, without psychosis (HCC)   . Anorexia 10/05/2017  . Uremia   . Protein-calorie malnutrition, severe 08/29/2017  . Generalized anxiety disorder 05/26/2017  . MDD (major depressive disorder) 05/26/2017  . Hypercholesterolemia 03/22/2017  . Nonspecific (abnormal) findings on radiological and other examination of body structure 12/26/2006  . NONSPECIFIC ABNORM FIND RAD&OTH EXAM LUNG FIELD  12/26/2006  . ANEMIA-IRON DEFICIENCY 12/15/2006  . Depression 12/15/2006  . Allergic rhinitis 12/15/2006    Palliative Care Assessment & Plan   Patient Profile: 67 year old with severe anxiety/depression s/p PEG for poor nutrition now with further decline related to respiratory failure/PNA.  Now full comfort care.  Recommendations/Plan: -DNR/DNI -D/c artificial feeding and hydration.  Comfort feeds by mouth as tolerated. -No further diagnostics -No antibiotic use -Medications to maximize comfort -Spiritual care support  Questions and concerns addressed via phone with sister, [name].  Discussed with Dr Nelson ChimesAmin.  PMT will continue to support holistically  Goals of Care and Additional Recommendations:  Limitations on Scope of Treatment: Full Comfort Care  Code Status:    Code Status Orders  (From admission, onward)         Start     Ordered   12/26/17 1615  Do not attempt resuscitation (DNR)  Continuous    Question Answer Comment  In the event of cardiac or respiratory ARREST Do not call a "code blue"   In the event of cardiac or respiratory ARREST Do not perform Intubation, CPR, defibrillation or ACLS   In the event of cardiac or respiratory ARREST Use medication by any route, position, wound care, and other measures to relive pain and suffering. May use oxygen, suction and manual treatment of airway obstruction as needed for comfort.      12/26/17 1614        Code Status History    Date Active Date Inactive Code Status Order ID Comments User Context   10/05/2017 2051 12/26/2017 1614 Full Code [id number]  Briscoe Deutscherpyd, Timothy S, MD ED   08/28/2017 2228 09/08/2017 1944 Full Code [id number]  Rometta EmeryGarba, Mohammad L, MD Inpatient   06/12/2017 1602 06/14/2017 2145 Full Code [id number]  Cathren LaineSteinl, Kevin, MD ED   05/26/2017 1611 06/10/2017 1539 Full Code [id number]  Charm RingsLord, Jamison Y, NP Inpatient   05/18/2017 [id number] 05/18/2017 1724 Full  Code [id number]  Dione Booze, MD ED   05/10/2017 1059 05/10/2017 1642 Full  Code [id number]  Derwood Kaplan, MD ED       Prognosis:   < 2 weeks most likely.  Plan to reassess in 24-48 hours.  Discharge Planning:  To Be Determined  Care plan was discussed with sister, RN, Dr. Nelson Chimes  Thank you for allowing the Palliative Medicine Team to assist in the care of this patient.   Time In: 1045 Time Out: 1125 Total Time 40 Prolonged Time Billed  no       Greater than 50%  of this time was spent counseling and coordinating care related to the above assessment and plan.  Romie Minus, MD  Please contact Palliative Medicine Team phone at [phone] for questions and concerns.

## 2017-12-29 NOTE — Progress Notes (Signed)
PROGRESS NOTE    Amanda Hall  ZOX:[id number] DOB: 07-Sep-1950 DOA: 10/05/2017 PCP: [name], MD   Brief Narrative:  67 year old who has been hospitalized here since 10/05/2017 has a history of anxiety and inpatient psych admission came to the hospital with increasing confusion, agitation and refusing taking p.o. on oral medication.  During the hospitalization she was divided by psychiatry recommended inpatient psych placement but due to poor p.o. intake PEG tube was placed and started on tube feeding.  Patient has been awaiting skilled nursing facility for a long time but overall continue became very deconditioned.  During the hospital course she developed respiratory distress and right lower lobe pneumonia recently requiring antibiotic.  She was made DNR and placed on BiPAP.  Palliative care consulted and patient was transitioned to comfort care on 12/27/2016.   Assessment & Plan:   Principal Problem:   MDD (major depressive disorder), recurrent severe, without psychosis (HCC) Active Problems:   MDD (major depressive disorder)   Anorexia   Dysphagia   Severe malnutrition (HCC)   Anxiety   Pressure injury of skin   Aspiration pneumonia (HCC)   Acute respiratory failure with hypoxia (HCC)   Failure to thrive in adult   Pneumonia due to infectious organism   Palliative care by specialist   DNR (do not resuscitate)  Sepsis due to right lower lobe pneumonia Dysphagia with very poor oral intake-severe protein calorie malnutrition - Appreciate palliative care input-currently decided to transition patient to comfort care without any further antibiotic use or diagnostic work-up. -We will plan on holding off on PEG tube, encourage oral feeding at this time as much as possible. -Routine aspiration precaution  Hypokalemia/hypophosphatemia and hypomagnesemia - Improving.  Concerns for refeeding syndrome.  Replete as necessary  Severe anxiety and depression - Currently patient is on  Zyprexa, Luvox, Remeron, Klonopin.  Psych is following.  Cardiac amyloid? -This was suspected on echocardiogram.  But hold off on further investigation as patient is now comfort care.  Severe deconditioning and generalized weakness -Patient is bedbound, does not ambulate  Multiple pressure related skin injury Iron deficiency anemia  Severe protein calorie malnutrition -Encourage oral diet as much as possible.  Given patient's chronic comorbidity and poor nutrition, she is extremely poor prognosis and likely will undergo a rapid decline over the course of next few days. I do not believe feeding her through her PEG tube will change her overall outcome  DVT prophylaxis: Lovenox Code Status: DNR/DNI Family Communication: None at bedside Disposition Plan: Currently comfort care.  Expect rapid decline in the hospital over the next 48 hours then she can be transitioned to hospice care.  Consultants:   Palliative care team  Psychiatry  Cardiology  IR  Procedures:   PEG tube  Echocardiogram  Antimicrobials:   None at this time   Subjective: Patient in the bed laying with her eyes closed and only responding yes to her name.  She does not want to speak otherwise.   Objective: Vitals:   12/28/17 0821 12/28/17 1759 12/28/17 2346 12/29/17 0814  BP: 129/68 117/69 (!) 120/59 (!) 127/53  Pulse: (!) 115 (!) 120 (!) 117 86  Resp:   16   Temp: 98 F (36.7 C) 98.7 F (37.1 C) 98.2 F (36.8 C) 97.9 F (36.6 C)  TempSrc: Oral Oral Oral   SpO2: 92% (!) 74% (!) 83% 90%  Weight:      Height:        Intake/Output Summary (Last 24 hours) at 12/29/2017  1154 Last data filed at 12/29/2017 0340 Gross per 24 hour  Intake 200 ml  Output 700 ml  Net -500 ml   Filed Weights   12/18/17 2210 12/19/17 0500 12/23/17 2147  Weight: 46.9 kg 46.9 kg 45.5 kg    Examination: Constitutional: Laying in the bed, severely malnourished appearing and dehydrated, bilateral temporal wasting  with cachexia Eyes: Eyes closed ENMT: Mucous membranes are dry posterior pharynx clear of any exudate or lesions.Normal dentition.  Neck: normal, supple, no masses, no thyromegaly Respiratory: clear to auscultation bilaterally, no wheezing, no crackles. Normal respiratory effort. No accessory muscle use.  Cardiovascular: Regular rate and rhythm, no murmurs / rubs / gallops. No extremity edema. 2+ pedal pulses. No carotid bruits.  Abdomen: no tenderness, no masses palpated. No hepatosplenomegaly. Bowel sounds positive.  Musculoskeletal: Full range of motion with some contractures noted in upper and lower extremity Skin: no rashes, lesions, ulcers. No induration Neurologic: Unable to fully assess, grossly moving all the extremities Psychiatric: Unable to fully assess   Data Reviewed:   CBC: Recent Labs  Lab 12/23/17 0440 12/25/17 0740 12/26/17 0404 12/27/17 0257  WBC 12.6* 14.0* 12.3* 12.9*  HGB 8.8* 10.5* 9.7* 8.7*  HCT 27.9* 34.3* 30.8* 26.8*  MCV 94.9 97.2 96.0 92.7  PLT 320 563* 332 331   Basic Metabolic Panel: Recent Labs  Lab 12/23/17 0440 12/24/17 0423 12/25/17 0600 12/26/17 0404 12/27/17 0257  NA 140 141 140 138 137  K 2.7* 3.9 4.6 3.8 3.7  CL 97* 102 103 102 98  CO2 31 26 27 23 28   GLUCOSE 121* 109* 108* 231* 158*  BUN 58* 62* 42* 59* 75*  CREATININE 0.54 0.62 0.43* 0.56 0.72  CALCIUM 9.2 9.5 9.7 8.9 9.4  MG 2.0  --  2.0  --  1.9  PHOS 2.4*  --  3.4  --  4.0   GFR: Estimated Creatinine Clearance: 49.7 mL/min (by C-G formula based on SCr of 0.72 mg/dL). Liver Function Tests: No results for input(s): AST, ALT, ALKPHOS, BILITOT, PROT, ALBUMIN in the last 168 hours. No results for input(s): LIPASE, AMYLASE in the last 168 hours. No results for input(s): AMMONIA in the last 168 hours. Coagulation Profile: No results for input(s): INR, PROTIME in the last 168 hours. Cardiac Enzymes: No results for input(s): CKTOTAL, CKMB, CKMBINDEX, TROPONINI in the last 168  hours. BNP (last 3 results) No results for input(s): PROBNP in the last 8760 hours. HbA1C: No results for input(s): HGBA1C in the last 72 hours. CBG: Recent Labs  Lab 12/26/17 1329 12/28/17 0819  GLUCAP 212* 144*   Lipid Profile: No results for input(s): CHOL, HDL, LDLCALC, TRIG, CHOLHDL, LDLDIRECT in the last 72 hours. Thyroid Function Tests: No results for input(s): TSH, T4TOTAL, FREET4, T3FREE, THYROIDAB in the last 72 hours. Anemia Panel: No results for input(s): VITAMINB12, FOLATE, FERRITIN, TIBC, IRON, RETICCTPCT in the last 72 hours. Sepsis Labs: Recent Labs  Lab 12/26/17 1432 12/26/17 1832  LATICACIDVEN 1.4 1.4    Recent Results (from the past 240 hour(s))  MRSA PCR Screening     Status: Abnormal   Collection Time: 12/27/17 12:35 AM  Result Value Ref Range Status   MRSA by PCR POSITIVE (A) NEGATIVE Final    Comment:        The GeneXpert MRSA Assay (FDA approved for NASAL specimens only), is one component of a comprehensive MRSA colonization surveillance program. It is not intended to diagnose MRSA infection nor to guide or monitor treatment for MRSA infections.  RESULT CALLED TO, READ BACK BY AND VERIFIED WITH: D OWEN RN 0300 12/27/17 A BROWNING Performed at [facility], [address] [place], [place] [id number]          Radiology Studies: No results found.      Scheduled Meds: . chlorhexidine  15 mL Mouth Rinse BID  . collagenase   Topical Daily  . fluvoxaMINE  150 mg Per Tube QHS  . mouth rinse  15 mL Mouth Rinse q12n4p  . mirtazapine  45 mg Per Tube QHS  . OLANZapine zydis  20 mg Sublingual QHS  . sennosides  5 mL Per Tube QHS  . sodium chloride flush  3 mL Intravenous Q12H   Continuous Infusions:   LOS: 85 days   Time spent= 27 mins    Junell Cullifer Joline Maxcyhirag Raniyah Curenton, MD [facility] Pager [phone]   If 7PM-7AM, please contact night-coverage [email or web address] Password [email or web address] 12/29/2017, 11:54 AM

## 2017-12-30 NOTE — Progress Notes (Signed)
[facility]  Received request from CSW [place] for family [facility] Chart reviewed and eligibility confirmed. Spoke with sister Velna HatchetSheila by phone to confirm interest. Velna HatchetSheila is aware [facility] and that someone will follow up tomorrow. Explained physician role/coverage and room and board charges.   Please do not hesitate to call with [facility]ospice[facility] related questions.  Thank you,  Forrestine Himva Davis, LCSW [phone]

## 2017-12-30 NOTE — Progress Notes (Signed)
PROGRESS NOTE    Amanda BreezeDana Centrella  ZOX:[id number] DOB: 03/01/1950 DOA: 10/05/2017 PCP: Gordy SaversKwiatkowski, Peter F, MD   Brief Narrative:  67 year old who has been hospitalized here since 10/05/2017 has a history of anxiety and inpatient psych admission came to the hospital with increasing confusion, agitation and refusing taking p.o. on oral medication.  During the hospitalization she was divided by psychiatry recommended inpatient psych placement but due to poor p.o. intake PEG tube was placed and started on tube feeding.  Patient has been awaiting skilled nursing facility for a long time but overall continue became very deconditioned.  During the hospital course she developed respiratory distress and right lower lobe pneumonia recently requiring antibiotic.  She was made DNR and placed on BiPAP.  Palliative care consulted and patient was transitioned to comfort care on 12/27/2016.   Assessment & Plan:   Principal Problem:   MDD (major depressive disorder), recurrent severe, without psychosis (HCC) Active Problems:   MDD (major depressive disorder)   Anorexia   Dysphagia   Severe malnutrition (HCC)   Anxiety   Pressure injury of skin   Aspiration pneumonia (HCC)   Acute respiratory failure with hypoxia (HCC)   Failure to thrive in adult   Pneumonia due to infectious organism   Palliative care by specialist   DNR (do not resuscitate)  Sepsis due to right lower lobe pneumonia Dysphagia with very poor oral intake-severe protein calorie malnutrition - Appreciate palliative care input-currently decided to transition patient to comfort care without any further antibiotic use or diagnostic work-up. -We will plan on holding off on PEG tube, encourage oral feeding at this time as much as possible. Currently unable to take anything by mouth due to lethargy.  Tube feeds are on hold.  Comfort care.  Hypokalemia/hypophosphatemia and hypomagnesemia - Improving.  Concerns for refeeding syndrome. Currently  comfort care  Severe anxiety and depression - Initially patient is on Zyprexa, Luvox, Remeron, Klonopin.  Psychiatry was consulted. Now comfort care.  Cardiac amyloid? -This was suspected on echocardiogram.  But hold off on further investigation as patient is now comfort care.  Severe deconditioning and generalized weakness -Patient is bedbound, does not ambulate  Multiple pressure related skin injury Iron deficiency anemia  Severe protein calorie malnutrition G-tube was placed. Due to rapid decline patient is now comfort care.   DVT prophylaxis: Lovenox Code Status: DNR/DNI Family Communication: None at bedside Disposition Plan: Currently comfort care.  Transitioning to residential hospice when bed is available..  Consultants:   Palliative care team  Psychiatry  Cardiology  IR  Procedures:   PEG tube  Echocardiogram  Antimicrobials:   None at this time   Subjective: Lethargic and drowsy.  Not following any commands.  No nausea no vomiting no other acute events overnight.   Objective: Vitals:   12/29/17 0814 12/29/17 2333 12/30/17 0719 12/30/17 1730  BP: (!) 127/53 125/70 (!) 142/81 136/76  Pulse: 86 (!) 122 (!) 119 (!) 126  Resp:  18 16 16   Temp: 97.9 F (36.6 C) 97.6 F (36.4 C) (!) 97.5 F (36.4 C) 97.9 F (36.6 C)  TempSrc:  Oral Oral   SpO2: 90% (!) 75% (!) 75% (!) 73%  Weight:      Height:        Intake/Output Summary (Last 24 hours) at 12/30/2017 1814 Last data filed at 12/30/2017 [id number] Gross per 24 hour  Intake 3 ml  Output 650 ml  Net -647 ml   Filed Weights   12/18/17 2210 12/19/17 0500  12/23/17 2147  Weight: 46.9 kg 46.9 kg 45.5 kg    Examination: Constitutional: Laying in the bed, severely malnourished appearing and dehydrated, bilateral temporal wasting with cachexia Eyes: Eyes closed ENMT: Mucous membranes are dry posterior pharynx clear of any exudate or lesions.Normal dentition.  Neck: normal, supple, no masses, no  thyromegaly Respiratory: clear to auscultation bilaterally, no wheezing, no crackles. Normal respiratory effort. No accessory muscle use.  Cardiovascular: Regular rate and rhythm, no murmurs / rubs / gallops. No extremity edema. 2+ pedal pulses. No carotid bruits.  Abdomen: no tenderness, no masses palpated. No hepatosplenomegaly. Bowel sounds positive.  Musculoskeletal: Full range of motion with some contractures noted in upper and lower extremity Skin: no rashes, lesions, ulcers. No induration Neurologic: Unable to fully assess, grossly moving all the extremities Psychiatric: Unable to fully assess   Data Reviewed:   CBC: Recent Labs  Lab 12/25/17 0740 12/26/17 0404 12/27/17 0257  WBC 14.0* 12.3* 12.9*  HGB 10.5* 9.7* 8.7*  HCT 34.3* 30.8* 26.8*  MCV 97.2 96.0 92.7  PLT 563* 332 331   Basic Metabolic Panel: Recent Labs  Lab 12/24/17 0423 12/25/17 0600 12/26/17 0404 12/27/17 0257  NA 141 140 138 137  K 3.9 4.6 3.8 3.7  CL 102 103 102 98  CO2 26 27 23 28   GLUCOSE 109* 108* 231* 158*  BUN 62* 42* 59* 75*  CREATININE 0.62 0.43* 0.56 0.72  CALCIUM 9.5 9.7 8.9 9.4  MG  --  2.0  --  1.9  PHOS  --  3.4  --  4.0   GFR: Estimated Creatinine Clearance: 49.7 mL/min (by C-G formula based on SCr of 0.72 mg/dL). Liver Function Tests: No results for input(s): AST, ALT, ALKPHOS, BILITOT, PROT, ALBUMIN in the last 168 hours. No results for input(s): LIPASE, AMYLASE in the last 168 hours. No results for input(s): AMMONIA in the last 168 hours. Coagulation Profile: No results for input(s): INR, PROTIME in the last 168 hours. Cardiac Enzymes: No results for input(s): CKTOTAL, CKMB, CKMBINDEX, TROPONINI in the last 168 hours. BNP (last 3 results) No results for input(s): PROBNP in the last 8760 hours. HbA1C: No results for input(s): HGBA1C in the last 72 hours. CBG: Recent Labs  Lab 12/26/17 1329 12/28/17 0819  GLUCAP 212* 144*   Lipid Profile: No results for input(s):  CHOL, HDL, LDLCALC, TRIG, CHOLHDL, LDLDIRECT in the last 72 hours. Thyroid Function Tests: No results for input(s): TSH, T4TOTAL, FREET4, T3FREE, THYROIDAB in the last 72 hours. Anemia Panel: No results for input(s): VITAMINB12, FOLATE, FERRITIN, TIBC, IRON, RETICCTPCT in the last 72 hours. Sepsis Labs: Recent Labs  Lab 12/26/17 1432 12/26/17 1832  LATICACIDVEN 1.4 1.4    Recent Results (from the past 240 hour(s))  MRSA PCR Screening     Status: Abnormal   Collection Time: 12/27/17 12:35 AM  Result Value Ref Range Status   MRSA by PCR POSITIVE (A) NEGATIVE Final    Comment:        The GeneXpert MRSA Assay (FDA approved for NASAL specimens only), is one component of a comprehensive MRSA colonization surveillance program. It is not intended to diagnose MRSA infection nor to guide or monitor treatment for MRSA infections. RESULT CALLED TO, READ BACK BY AND VERIFIED WITH: D OWEN RN 0300 12/27/17 A BROWNING Performed at [facility], [address], [place], [place] [address]          Radiology Studies: No results found.      Scheduled Meds: . chlorhexidine  15 mL Mouth Rinse BID  . collagenase   Topical Daily  . fluvoxaMINE  150 mg Per Tube QHS  . mouth rinse  15 mL Mouth Rinse q12n4p  . mirtazapine  45 mg Per Tube QHS  . OLANZapine zydis  20 mg Sublingual QHS  . sennosides  5 mL Per Tube QHS  . sodium chloride flush  3 mL Intravenous Q12H   Continuous Infusions:   LOS: 86 days   Time spent= 27 mins    Amanda OxfordPranav Shelbylynn Walczyk, MD [facility]  If 7PM-7AM, please contact night-coverage [email or web address] Password [email or web address] 12/30/2017, 6:14 PM

## 2017-12-30 NOTE — Progress Notes (Signed)
Daily Progress Note   Patient Name: Amanda Hall       Date: 12/30/2017 DOB: [birth year]  Age: 67 y.o. MRN#: [id number] Attending Physician: Rolly SalterPatel, Pranav M, MD Primary Care Physician: Gordy SaversKwiatkowski, Peter F, MD Admit Date: 10/05/2017  Reason for Consultation/Follow-up: Establishing goals of care  Subjective: I saw and examined patient today.  She is lying in bed.  Answers few questions appropriately, but not able to hold meaningful conversation.  See below.  Length of Stay: 3086  Current Medications: Scheduled Meds:  . chlorhexidine  15 mL Mouth Rinse BID  . collagenase   Topical Daily  . fluvoxaMINE  150 mg Per Tube QHS  . mouth rinse  15 mL Mouth Rinse q12n4p  . mirtazapine  45 mg Per Tube QHS  . OLANZapine zydis  20 mg Sublingual QHS  . sennosides  5 mL Per Tube QHS  . sodium chloride flush  3 mL Intravenous Q12H    Continuous Infusions:   PRN Meds: acetaminophen **OR** acetaminophen, clonazePAM, HYDROcodone-acetaminophen, loperamide, morphine injection, naphazoline-glycerin, ondansetron **OR** ondansetron (ZOFRAN) IV  Physical Exam      General: Lying in bed, cachectic, decreased interaction Heart: Regular rate and rhythm. No murmur appreciated. Lungs: Diminished air movement Abdomen: Soft, nontender, nondistended Ext: No significant edema, significant muscle wasting noted Skin: Warm and dry Neuro: Grossly intact, nonfocal.     Vital Signs: BP (!) 142/81 (BP Location: Right Arm)   Pulse (!) 119   Temp (!) 97.5 F (36.4 C) (Oral)   Resp 16   Ht 5\' 2"  (1.575 m)   Wt 45.5 kg   SpO2 (!) 75%   BMI 18.35 kg/m  SpO2: SpO2: (!) 75 % O2 Device: O2 Device: Room Air O2 Flow Rate: O2 Flow Rate (L/min): 1 L/min  Intake/output summary:   Intake/Output Summary (Last 24  hours) at 12/30/2017 0749 Last data filed at 12/30/2017 0400 Gross per 24 hour  Intake 0 ml  Output 650 ml  Net -650 ml   LBM: Last BM Date: 12/26/17 Baseline Weight: Weight: 45.4 kg Most recent weight: Weight: 45.5 kg       Palliative Assessment/Data:    Flowsheet Rows     Most Recent Value  Intake Tab  Referral Department  Hospitalist  Unit at Time of Referral  Med/Surg Unit  Palliative Care Primary  Diagnosis  Other (Comment) [Psych]  Date Notified  11/18/17  Palliative Care Type  New Palliative care  Reason Not Seen  -- [Inappropriate referral]  Reason for referral  Clarify Goals of Care  Date of Admission  10/05/17  Date first seen by Palliative Care  11/21/17  # of days Palliative referral response time  3 Day(s)  # of days IP prior to Palliative referral  44  Clinical Assessment  Palliative Performance Scale Score  20%  Psychosocial & Spiritual Assessment  Palliative Care Outcomes  Patient/Family meeting held?  No      Patient Active Problem List   Diagnosis Date Noted  . Failure to thrive in adult   . Pneumonia due to infectious organism   . Palliative care by specialist   . DNR (do not resuscitate)   . Acute respiratory failure with hypoxia (HCC)   . Aspiration pneumonia (HCC)   . Pressure injury of skin 11/22/2017  . Anxiety 10/17/2017  . Dysphagia 10/12/2017  . Severe malnutrition (HCC) 10/12/2017  . MDD (major depressive disorder), recurrent severe, without psychosis (HCC)   . Anorexia 10/05/2017  . Uremia   . Protein-calorie malnutrition, severe 08/29/2017  . Generalized anxiety disorder 05/26/2017  . MDD (major depressive disorder) 05/26/2017  . Hypercholesterolemia 03/22/2017  . Nonspecific (abnormal) findings on radiological and other examination of body structure 12/26/2006  . NONSPECIFIC ABNORM FIND RAD&OTH EXAM LUNG FIELD 12/26/2006  . ANEMIA-IRON DEFICIENCY 12/15/2006  . Depression 12/15/2006  . Allergic rhinitis 12/15/2006     Palliative Care Assessment & Plan   Patient Profile: 67 year old with severe anxiety/depression s/p PEG for poor nutrition now with further decline related to respiratory failure/PNA.  Now full comfort care.  Recommendations/Plan: -DNR/DNI -D/c artificial feeding and hydration.  Comfort feeds by mouth as tolerated. -No further diagnostics -No antibiotic use -Medications to maximize comfort -Spiritual care support   PMT will continue to support holistically  Goals of Care and Additional Recommendations:  Limitations on Scope of Treatment: Full Comfort Care  Code Status:    Code Status Orders  (From admission, onward)         Start     Ordered   12/26/17 1615  Do not attempt resuscitation (DNR)  Continuous    Question Answer Comment  In the event of cardiac or respiratory ARREST Do not call a "code blue"   In the event of cardiac or respiratory ARREST Do not perform Intubation, CPR, defibrillation or ACLS   In the event of cardiac or respiratory ARREST Use medication by any route, position, wound care, and other measures to relive pain and suffering. May use oxygen, suction and manual treatment of airway obstruction as needed for comfort.      12/26/17 1614        Code Status History    Date Active Date Inactive Code Status Order ID Comments User Context   10/05/2017 2051 12/26/2017 1614 Full Code [id number]  Briscoe Deutscherpyd, Timothy S, MD ED   08/28/2017 2228 09/08/2017 1944 Full Code [id number]  Rometta EmeryGarba, Mohammad L, MD Inpatient   06/12/2017 1602 06/14/2017 2145 Full Code [id number]  Cathren LaineSteinl, Kevin, MD ED   05/26/2017 1611 06/10/2017 1539 Full Code [id number]  Charm RingsLord, Jamison Y, NP Inpatient   05/18/2017 0614 05/18/2017 1724 Full Code [id number]  Dione BoozeGlick, David, MD ED   05/10/2017 1059 05/10/2017 1642 Full Code [id number]  Derwood KaplanNanavati, Ankit, MD ED       Prognosis:   < 2 weeks most likely.  Plan to  reassess tomorrow.  Discharge Planning:  Hospice facility in 24-48 hours?  Care plan was  discussed with Dr. Nelson Chimes  Thank you for allowing the Palliative Medicine Team to assist in the care of this patient.   Total Time 20 Prolonged Time Billed  no       Greater than 50%  of this time was spent counseling and coordinating care related to the above assessment and plan.  Romie Minus, MD  Please contact Palliative Medicine Team phone at [phone] for questions and concerns.

## 2017-12-30 NOTE — Progress Notes (Signed)
Daily Progress Note   Patient Name: Amanda Hall       Date: 12/30/2017 DOB: 1950/03/18  Age: 67 y.o. MRN#: [id number] Attending Physician: Rolly SalterPatel, Pranav M, MD Primary Care Physician: Gordy SaversKwiatkowski, Peter F, MD Admit Date: 10/05/2017  Reason for Consultation/Follow-up: Establishing goals of care  Subjective: I saw and examined patient today.  She is lying in bed. She does not arouse to gentle verbal or tactile stimulation.  I called and spoke with her sister.  She reports understanding that patient is dying.  We discussed residential hospice and why this may be a good fit for her sister moving forward.  Her sister states that decision "is really hard, but I know it would be best."  She is agreeable to residential hospice evaluation and expressed [facility].  See below.  Length of Stay: 4686  Current Medications: Scheduled Meds:  . chlorhexidine  15 mL Mouth Rinse BID  . collagenase   Topical Daily  . fluvoxaMINE  150 mg Per Tube QHS  . mouth rinse  15 mL Mouth Rinse q12n4p  . mirtazapine  45 mg Per Tube QHS  . OLANZapine zydis  20 mg Sublingual QHS  . sennosides  5 mL Per Tube QHS  . sodium chloride flush  3 mL Intravenous Q12H    Continuous Infusions:   PRN Meds: acetaminophen **OR** acetaminophen, clonazePAM, HYDROcodone-acetaminophen, loperamide, morphine injection, naphazoline-glycerin, ondansetron **OR** ondansetron (ZOFRAN) IV  Physical Exam      General: Lying in bed, cachectic, does not arouse to gentle verbal or tactile stimulation Heart: Regular rate and rhythm. No murmur appreciated. Lungs: Diminished air movement Abdomen: Soft, nontender, nondistended Ext: No significant edema, significant muscle wasting noted Skin: Warm and dry  Vital Signs: BP (!)  142/81 (BP Location: Right Arm)   Pulse (!) 119   Temp (!) 97.5 F (36.4 C) (Oral)   Resp 16   Ht 5\' 2"  (1.575 m)   Wt 45.5 kg   SpO2 (!) 75%   BMI 18.35 kg/m  SpO2: SpO2: (!) 75 % O2 Device: O2 Device: Room Air O2 Flow Rate: O2 Flow Rate (L/min): 1 L/min  Intake/output summary:   Intake/Output Summary (Last 24 hours) at 12/30/2017 1257 Last data filed at 12/30/2017 0808 Gross per 24 hour  Intake 3 ml  Output 650 ml  Net -647 ml   LBM: Last BM Date: 12/26/17 Baseline Weight: Weight: 45.4 kg Most recent weight: Weight: 45.5 kg       Palliative Assessment/Data:    Flowsheet Rows     Most Recent Value  Intake Tab  Referral Department  Hospitalist  Unit at Time of Referral  Med/Surg Unit  Palliative Care Primary Diagnosis  Other (Comment) [Psych]  Date Notified  11/18/17  Palliative Care Type  New Palliative care  Reason Not Seen  -- [Inappropriate referral]  Reason for referral  Clarify Goals of Care  Date of Admission  10/05/17  Date first seen by Palliative Care  11/21/17  # of days Palliative referral response time  3 Day(s)  # of days IP prior to Palliative referral  44  Clinical Assessment  Palliative Performance Scale Score  20%  Psychosocial & Spiritual Assessment  Palliative Care Outcomes  Patient/Family meeting held?  No      Patient Active Problem List   Diagnosis Date Noted  . Failure to thrive in adult   . Pneumonia due to infectious organism   . Palliative care by specialist   . DNR (do not resuscitate)   . Acute respiratory failure with hypoxia (HCC)   . Aspiration pneumonia (HCC)   . Pressure injury of skin 11/22/2017  . Anxiety 10/17/2017  . Dysphagia 10/12/2017  . Severe malnutrition (HCC) 10/12/2017  . MDD (major depressive disorder), recurrent severe, without psychosis (HCC)   . Anorexia 10/05/2017  . Uremia   . Protein-calorie malnutrition, severe 08/29/2017  . Generalized anxiety disorder 05/26/2017  . MDD (major depressive  disorder) 05/26/2017  . Hypercholesterolemia 03/22/2017  . Nonspecific (abnormal) findings on radiological and other examination of body structure 12/26/2006  . NONSPECIFIC ABNORM FIND RAD&OTH EXAM LUNG FIELD 12/26/2006  . ANEMIA-IRON DEFICIENCY 12/15/2006  . Depression 12/15/2006  . Allergic rhinitis 12/15/2006    Palliative Care Assessment & Plan   Patient Profile: 67 year old with severe anxiety/depression s/p PEG for poor nutrition now with further decline related to respiratory failure/PNA.  Now full comfort care.  Recommendations/Plan: -DNR/DNI -D/c artificial feeding and hydration.  Comfort feeds by mouth as tolerated.  Not taking in any nutrition or hydration at this point. -No further diagnostics -No antibiotic use -Medications to maximize comfort -Discussed with sister and Dr. Allena KatzPatel.  Plan for evaluation by residential hospice.  SW aware and appreciate assistance.  PMT will continue to support holistically  Goals of Care and Additional Recommendations:  Limitations on Scope of Treatment: Full Comfort Care  Code Status:    Code Status Orders  (From admission, onward)         Start     Ordered   12/26/17 1615  Do not attempt resuscitation (DNR)  Continuous    Question Answer Comment  In the event of cardiac or respiratory ARREST Do not call a "code blue"   In the event of cardiac or respiratory ARREST Do not perform Intubation, CPR, defibrillation or ACLS   In the event of cardiac or respiratory ARREST Use medication by any route, position, wound care, and other measures to relive pain and suffering. May use oxygen, suction and manual treatment of airway obstruction as needed for comfort.      12/26/17 1614        Code Status History    Date Active Date Inactive Code Status Order ID Comments User Context   10/05/2017 2051 12/26/2017 1614 Full Code [id number]  Briscoe Deutscherpyd, Timothy S, MD ED  08/28/2017 2228 09/08/2017 1944 Full Code [id number]  Rometta Emery, MD  Inpatient   06/12/2017 1602 06/14/2017 2145 Full Code [id number]  Cathren Laine, MD ED   05/26/2017 1611 06/10/2017 1539 Full Code [id number]  Charm Rings, NP Inpatient   05/18/2017 0614 05/18/2017 1724 Full Code [id number]  Dione Booze, MD ED   05/10/2017 1059 05/10/2017 1642 Full Code [id number]  Derwood Kaplan, MD ED       Prognosis:   < 2 weeks .  She is getting weaker, more somnolent, and is not taking in nutrition or hydration.  She is not on abx with underlying PNA and goal is for comfort.  I believe that she will best be served by residential hospice placement on discharge.  Discharge Planning:  Hospice facility   Care plan was discussed with Dr. Allena Katz, patient sister  Thank you for allowing the Palliative Medicine Team to assist in the care of this patient.   Total Time 30 Prolonged Time Billed  no       Greater than 50%  of this time was spent counseling and coordinating care related to the above assessment and plan.  Romie Minus, MD  Please contact Palliative Medicine Team phone at [phone] for questions and concerns.

## 2017-12-31 NOTE — Progress Notes (Signed)
PROGRESS NOTE    Coolidge BreezeDana Hall  ZOX:[id number] DOB: [birth year] DOA: 10/05/2017 PCP: Gordy SaversKwiatkowski, Peter F, MD   Brief Narrative:  67 year old who has been hospitalized here since 10/05/2017 has a history of anxiety and inpatient psych admission came to the hospital with increasing confusion, agitation and refusing taking p.o. on oral medication.  During the hospitalization she was divided by psychiatry recommended inpatient psych placement but due to poor p.o. intake PEG tube was placed and started on tube feeding.  Patient has been awaiting skilled nursing facility for a long time but overall continue became very deconditioned.  During the hospital course she developed respiratory distress and right lower lobe pneumonia recently requiring antibiotic.  She was made DNR and placed on BiPAP.  Palliative care consulted and patient was transitioned to comfort care on 12/27/2016.   Assessment & Plan:   Principal Problem:   MDD (major depressive disorder), recurrent severe, without psychosis (HCC) Active Problems:   MDD (major depressive disorder)   Anorexia   Dysphagia   Severe malnutrition (HCC)   Anxiety   Pressure injury of skin   Aspiration pneumonia (HCC)   Acute respiratory failure with hypoxia (HCC)   Failure to thrive in adult   Pneumonia due to infectious organism   Palliative care by specialist   DNR (do not resuscitate)  Sepsis due to right lower lobe pneumonia Dysphagia with very poor oral intake-severe protein calorie malnutrition - Appreciate palliative care input-currently decided to transition patient to comfort care without any further antibiotic use or diagnostic work-up. -We will plan on holding off on PEG tube, encourage oral feeding at this time as much as possible. Currently unable to take anything by mouth due to lethargy.  Tube feeds are on hold.  Comfort care.  Hypokalemia/hypophosphatemia and hypomagnesemia - Improving.  Concerns for refeeding syndrome. Currently  comfort care  Severe anxiety and depression - Initially patient is on Zyprexa, Luvox, Remeron, Klonopin.  Psychiatry was consulted. Now comfort care.  Cardiac amyloid? -This was suspected on echocardiogram.  But hold off on further investigation as patient is now comfort care.  Severe deconditioning and generalized weakness -Patient is bedbound, does not ambulate  Multiple pressure related skin injury Iron deficiency anemia  Severe protein calorie malnutrition G-tube was placed. Due to rapid decline patient is now comfort care.  No significant change in patient's condition.  Currently awaiting transfer to residential hospice.  Gnosis remains less than 2 weeks.  DVT prophylaxis: Lovenox Code Status: DNR/DNI Family Communication: None at bedside Disposition Plan: Currently comfort care.  Transitioning to residential hospice when bed is available..  Consultants:   Palliative care team  Psychiatry  Cardiology  IR  Procedures:   PEG tube  Echocardiogram  Antimicrobials:   None at this time   Subjective: Lethargic and drowsy.  Not following any commands.  No nausea no vomiting no other acute events overnight.   Objective: Vitals:   12/30/17 0719 12/30/17 1730 12/30/17 2233 12/31/17 0834  BP: (!) 142/81 136/76 (!) 146/82 129/69  Pulse: (!) 119 (!) 126 (!) 128 (!) 117  Resp: 16 16 (!) 22 11  Temp: (!) 97.5 F (36.4 C) 97.9 F (36.6 C) 98.1 F (36.7 C) 98.2 F (36.8 C)  TempSrc: Oral  Oral Oral  SpO2: (!) 75% (!) 73% (!) 68% (!) 70%  Weight:      Height:        Intake/Output Summary (Last 24 hours) at 12/31/2017 1645 Last data filed at 12/31/2017 0500 Gross per 24  hour  Intake -  Output 200 ml  Net -200 ml   Filed Weights   12/18/17 2210 12/19/17 0500 12/23/17 2147  Weight: 46.9 kg 46.9 kg 45.5 kg    Examination: Constitutional: Laying in the bed, severely malnourished appearing and dehydrated, bilateral temporal wasting with cachexia Eyes:  Eyes closed ENMT: Mucous membranes are dry posterior pharynx clear of any exudate or lesions.Normal dentition.  Neck: normal, supple, no masses, no thyromegaly Respiratory: clear to auscultation bilaterally, no wheezing, no crackles. Normal respiratory effort. No accessory muscle use.  Cardiovascular: Regular rate and rhythm, no murmurs / rubs / gallops. No extremity edema. 2+ pedal pulses. No carotid bruits.  Abdomen: no tenderness, no masses palpated. No hepatosplenomegaly. Bowel sounds positive.  Musculoskeletal: Full range of motion with some contractures noted in upper and lower extremity Skin: no rashes, lesions, ulcers. No induration Neurologic: Unable to fully assess, grossly moving all the extremities Psychiatric: Unable to fully assess   Data Reviewed:   CBC: Recent Labs  Lab 12/25/17 0740 12/26/17 0404 12/27/17 0257  WBC 14.0* 12.3* 12.9*  HGB 10.5* 9.7* 8.7*  HCT 34.3* 30.8* 26.8*  MCV 97.2 96.0 92.7  PLT 563* 332 331   Basic Metabolic Panel: Recent Labs  Lab 12/25/17 0600 12/26/17 0404 12/27/17 0257  NA 140 138 137  K 4.6 3.8 3.7  CL 103 102 98  CO2 27 23 28   GLUCOSE 108* 231* 158*  BUN 42* 59* 75*  CREATININE 0.43* 0.56 0.72  CALCIUM 9.7 8.9 9.4  MG 2.0  --  1.9  PHOS 3.4  --  4.0   GFR: Estimated Creatinine Clearance: 49.7 mL/min (by C-G formula based on SCr of 0.72 mg/dL). Liver Function Tests: No results for input(s): AST, ALT, ALKPHOS, BILITOT, PROT, ALBUMIN in the last 168 hours. No results for input(s): LIPASE, AMYLASE in the last 168 hours. No results for input(s): AMMONIA in the last 168 hours. Coagulation Profile: No results for input(s): INR, PROTIME in the last 168 hours. Cardiac Enzymes: No results for input(s): CKTOTAL, CKMB, CKMBINDEX, TROPONINI in the last 168 hours. BNP (last 3 results) No results for input(s): PROBNP in the last 8760 hours. HbA1C: No results for input(s): HGBA1C in the last 72 hours. CBG: Recent Labs  Lab  12/26/17 1329 12/28/17 0819  GLUCAP 212* 144*   Lipid Profile: No results for input(s): CHOL, HDL, LDLCALC, TRIG, CHOLHDL, LDLDIRECT in the last 72 hours. Thyroid Function Tests: No results for input(s): TSH, T4TOTAL, FREET4, T3FREE, THYROIDAB in the last 72 hours. Anemia Panel: No results for input(s): VITAMINB12, FOLATE, FERRITIN, TIBC, IRON, RETICCTPCT in the last 72 hours. Sepsis Labs: Recent Labs  Lab 12/26/17 1432 12/26/17 1832  LATICACIDVEN 1.4 1.4    Recent Results (from the past 240 hour(s))  MRSA PCR Screening     Status: Abnormal   Collection Time: 12/27/17 12:35 AM  Result Value Ref Range Status   MRSA by PCR POSITIVE (A) NEGATIVE Final    Comment:        The GeneXpert MRSA Assay (FDA approved for NASAL specimens only), is one component of a comprehensive MRSA colonization surveillance program. It is not intended to diagnose MRSA infection nor to guide or monitor treatment for MRSA infections. RESULT CALLED TO, READ BACK BY AND VERIFIED WITH: D OWEN RN 0300 12/27/17 A BROWNING Performed at [facility], [address] [place], [place] [address]          Radiology Studies: No results found.  Scheduled Meds: . chlorhexidine  15 mL Mouth Rinse BID  . collagenase   Topical Daily  . fluvoxaMINE  150 mg Per Tube QHS  . mouth rinse  15 mL Mouth Rinse q12n4p  . mirtazapine  45 mg Per Tube QHS  . OLANZapine zydis  20 mg Sublingual QHS  . sennosides  5 mL Per Tube QHS  . sodium chloride flush  3 mL Intravenous Q12H   Continuous Infusions:   LOS: 87 days   Time spent= 27 mins    Lynden Oxford, MD [facility]  If 7PM-7AM, please contact night-coverage [email or web address] Password [facility] 12/31/2017, 4:45 PM

## 2017-12-31 NOTE — Progress Notes (Signed)
[facility] Lia[facility] RN note@2 :30pm  RN spoke with CSW  with update on [facility]. Chart reviewed and spoke with family Kris Mouton(Shelia Losee) to acknowledge referral. Unfortunately [facility] is not able to offer a room today. Family and CSW are aware [facility] liaison will follow up with CSW and family tomorrow or sooner if room becomes available. Please do not hesitate to call with questions. Thank you. ?  Gerri SporeLisa Matthews,RN, [facility] Liaison  [phone] [facility] Liasions are on AMION

## 2017-12-31 NOTE — Progress Notes (Signed)
CSW received a phone call from [facility]. They currently have no beds available for today, 12/31/2017.   CSW will continue to follow patient through the weekend.   Drucilla Schmidtaitlin Ria Redcay, MSW, LCSW-A Clinical Social Worker [facility]

## 2018-01-01 MED ORDER — MORPHINE SULFATE (PF) 2 MG/ML IV SOLN
2.0000 mg | Freq: Three times a day (TID) | INTRAVENOUS | Status: DC
  Administered 2018-01-01 (×3): 2 mg via INTRAVENOUS
  Filled 2018-01-01 (×4): qty 1

## 2018-01-01 MED ORDER — LORAZEPAM 2 MG/ML IJ SOLN
0.5000 mg | Freq: Three times a day (TID) | INTRAMUSCULAR | Status: DC
  Administered 2018-01-01 – 2018-01-02 (×4): 0.5 mg via INTRAVENOUS
  Filled 2018-01-01 (×5): qty 1

## 2018-01-01 NOTE — Progress Notes (Signed)
PROGRESS NOTE    Amanda Hall  AOZ:[id number] DOB: November 09, 1950 DOA: 10/05/2017 PCP: [name], MD   Brief Narrative:  67 year old who has been hospitalized here since 10/05/2017 has a history of anxiety and inpatient psych admission came to the hospital with increasing confusion, agitation and refusing taking p.o. on oral medication.  During the hospitalization she was divided by psychiatry recommended inpatient psych placement but due to poor p.o. intake PEG tube was placed and started on tube feeding.  Patient has been awaiting skilled nursing facility for a long time but overall continue became very deconditioned.  During the hospital course she developed respiratory distress and right lower lobe pneumonia recently requiring antibiotic.  She was made DNR and placed on BiPAP.  Palliative care consulted and patient was transitioned to comfort care on 12/27/2016.   Assessment & Plan:   Principal Problem:   MDD (major depressive disorder), recurrent severe, without psychosis (HCC) Active Problems:   MDD (major depressive disorder)   Anorexia   Dysphagia   Severe malnutrition (HCC)   Anxiety   Pressure injury of skin   Aspiration pneumonia (HCC)   Acute respiratory failure with hypoxia (HCC)   Failure to thrive in adult   Pneumonia due to infectious organism   Palliative care by specialist   DNR (do not resuscitate)  Sepsis due to right lower lobe pneumonia Dysphagia with very poor oral intake-severe protein calorie malnutrition - Appreciate palliative care input-currently decided to transition patient to comfort care without any further antibiotic use or diagnostic work-up. -We will plan on holding off on PEG tube, encourage oral feeding at this time as much as possible. Currently unable to take anything by mouth due to lethargy.  Tube feeds are on hold.  Comfort care.  Hypokalemia/hypophosphatemia and hypomagnesemia - Improving.  Concerns for refeeding syndrome. Currently  comfort care  Tachycardia and tachypnea. Likely secondary to anxiety and respiratory distress. We will place the patient on scheduled lorazepam as well as scheduled morphine. Has not received any medication in last 48 hours.  Severe anxiety and depression - Initially patient is on Zyprexa, Luvox, Remeron, Klonopin.  Psychiatry was consulted. Now comfort care.  Cardiac amyloid? -This was suspected on echocardiogram.  But hold off on further investigation as patient is now comfort care.  Severe deconditioning and generalized weakness -Patient is bedbound, does not ambulate  Multiple pressure related skin injury Iron deficiency anemia  Severe protein calorie malnutrition G-tube was placed. Due to rapid decline patient is now comfort care.  No significant change in patient's condition.  Currently awaiting transfer to residential hospice.  Gnosis remains less than 2 weeks.  DVT prophylaxis: Lovenox Code Status: DNR/DNI Family Communication: None at bedside Disposition Plan: Currently comfort care.  Transitioning to residential hospice when bed is available..  Consultants:   Palliative care team  Psychiatry  Cardiology  IR  Procedures:   PEG tube  Echocardiogram  Antimicrobials:   None at this time   Subjective: Keeping her eyes closed.  Not following any commands.  Seen after receiving lorazepam.  Objective: Vitals:   12/31/17 0834 12/31/17 1707 12/31/17 2322 01/01/18 0807  BP: 129/69 (!) 145/78 140/82 (!) 153/97  Pulse: (!) 117 (!) 126 (!) 143 (!) 131  Resp: 11 10 (!) 24 16  Temp: 98.2 F (36.8 C) 97.9 F (36.6 C) 98.5 F (36.9 C) 98.6 F (37 C)  TempSrc: Oral Oral Oral Oral  SpO2: (!) 70% (!) 80% (!) 73% 96%  Weight:  Height:        Intake/Output Summary (Last 24 hours) at 01/01/2018 1628 Last data filed at 01/01/2018 0800 Gross per 24 hour  Intake 0 ml  Output -  Net 0 ml   Filed Weights   12/18/17 2210 12/19/17 0500 12/23/17 2147    Weight: 46.9 kg 46.9 kg 45.5 kg    Examination: Constitutional: Laying in the bed, severely malnourished appearing and dehydrated, bilateral temporal wasting with cachexia Eyes: Eyes closed ENMT: Mucous membranes are dry posterior pharynx clear of any exudate or lesions.Normal dentition.  Neck: normal, supple, no masses, no thyromegaly Respiratory: clear to auscultation bilaterally, no wheezing, no crackles. Normal respiratory effort. No accessory muscle use.  Cardiovascular: Regular rate and rhythm, no murmurs / rubs / gallops. No extremity edema. 2+ pedal pulses. No carotid bruits.  Abdomen: no tenderness, no masses palpated. No hepatosplenomegaly. Bowel sounds positive.  Musculoskeletal: Full range of motion with some contractures noted in upper and lower extremity Skin: no rashes, lesions, ulcers. No induration Neurologic: Unable to fully assess, grossly moving all the extremities Psychiatric: Unable to fully assess   Data Reviewed:   CBC: Recent Labs  Lab 12/26/17 0404 12/27/17 0257  WBC 12.3* 12.9*  HGB 9.7* 8.7*  HCT 30.8* 26.8*  MCV 96.0 92.7  PLT 332 331   Basic Metabolic Panel: Recent Labs  Lab 12/26/17 0404 12/27/17 0257  NA 138 137  K 3.8 3.7  CL 102 98  CO2 23 28  GLUCOSE 231* 158*  BUN 59* 75*  CREATININE 0.56 0.72  CALCIUM 8.9 9.4  MG  --  1.9  PHOS  --  4.0   GFR: Estimated Creatinine Clearance: 49.7 mL/min (by C-G formula based on SCr of 0.72 mg/dL). Liver Function Tests: No results for input(s): AST, ALT, ALKPHOS, BILITOT, PROT, ALBUMIN in the last 168 hours. No results for input(s): LIPASE, AMYLASE in the last 168 hours. No results for input(s): AMMONIA in the last 168 hours. Coagulation Profile: No results for input(s): INR, PROTIME in the last 168 hours. Cardiac Enzymes: No results for input(s): CKTOTAL, CKMB, CKMBINDEX, TROPONINI in the last 168 hours. BNP (last 3 results) No results for input(s): PROBNP in the last 8760  hours. HbA1C: No results for input(s): HGBA1C in the last 72 hours. CBG: Recent Labs  Lab 12/26/17 1329 12/28/17 0819  GLUCAP 212* 144*   Lipid Profile: No results for input(s): CHOL, HDL, LDLCALC, TRIG, CHOLHDL, LDLDIRECT in the last 72 hours. Thyroid Function Tests: No results for input(s): TSH, T4TOTAL, FREET4, T3FREE, THYROIDAB in the last 72 hours. Anemia Panel: No results for input(s): VITAMINB12, FOLATE, FERRITIN, TIBC, IRON, RETICCTPCT in the last 72 hours. Sepsis Labs: Recent Labs  Lab 12/26/17 1432 12/26/17 1832  LATICACIDVEN 1.4 1.4    Recent Results (from the past 240 hour(s))  MRSA PCR Screening     Status: Abnormal   Collection Time: 12/27/17 12:35 AM  Result Value Ref Range Status   MRSA by PCR POSITIVE (A) NEGATIVE Final    Comment:        The GeneXpert MRSA Assay (FDA approved for NASAL specimens only), is one component of a comprehensive MRSA colonization surveillance program. It is not intended to diagnose MRSA infection nor to guide or monitor treatment for MRSA infections. RESULT CALLED TO, READ BACK BY AND VERIFIED WITH: D OWEN RN 0300 12/27/17 A BROWNING Performed at [facility], [address] [place], [place] [address]          Radiology  Studies: No results found.      Scheduled Meds: . chlorhexidine  15 mL Mouth Rinse BID  . collagenase   Topical Daily  . fluvoxaMINE  150 mg Per Tube QHS  . LORazepam  0.5 mg Intravenous TID  . mirtazapine  45 mg Per Tube QHS  .  morphine injection  2 mg Intravenous TID  . OLANZapine zydis  20 mg Sublingual QHS  . sennosides  5 mL Per Tube QHS  . sodium chloride flush  3 mL Intravenous Q12H   Continuous Infusions:   LOS: 88 days   Time spent= 27 mins    Lynden OxfordPranav Ferlin Fairhurst, MD [facility]  If 7PM-7AM, please contact night-coverage [email or web address] Password [email or web address] 01/01/2018, 4:28 PM

## 2018-01-01 NOTE — Progress Notes (Signed)
[facility] Liaison RN note@1034  AM  RN spoke with CSW [place](Muhammad) with update on no beds available [facility]. Also spoke with family Sheran Spine(Sheila Teutsch). [facility]. Family and CSW are aware [facility] liaison will follow up with CSW and family tomorrow or sooner if room becomes available. Please do not hesitate to call with questions.  Thank you. ?  Gerri SporeLisa Matthews,RN, [facility] Liaison  [phone] [facility] Liasions are on AMION

## 2018-01-01 NOTE — Progress Notes (Signed)
[facility] RN note @ 3:30 PM  Received request from CSW Luther Parody(Caitlin) for family interest in [facility] with request for transfer . Chart reviewed. Spoke with family (sister [name] Sookdeo)to confirm interest and explain services. Family agreeable to transfer tomorrow (01/02/2018. CSW aware. Registration paper work will be completed as requested by family on tomorrow prior to pt's [facility]. Dr. Kern Reaponald Hertweck to assume care per family request.   Please fax discharge summary to [phone]. RN please call report to [phone]. Please arrange transport for patient to arrive before noon if possible.  Thank you.   Gerri SporeLisa Matthews,RN, BSN [facility] Liaison  [phone] [facility] Liasions are on AMION

## 2018-01-01 NOTE — Progress Notes (Addendum)
01/01/2018: Update:   Received a phone call back from [facility]. They do not have any beds available for today. They may have some tomorrow.   Drucilla Schmidtaitlin Jammie Clink, MSW, LCSW-A Clinical Social Worker [place] [facility]  CSW called [facility] to see if they had any available beds. Left a message, hopefully someone will call back.  CSW will continue to follow.   Drucilla Schmidtaitlin Aveion Nguyen, MSW, LCSW-A Clinical Social Worker [facility]

## 2018-01-02 MED ORDER — MIRTAZAPINE 45 MG PO TBDP: 45.0000 mg | ORAL_TABLET | Freq: Every day | ORAL | Status: AC

## 2018-01-02 MED ORDER — FLUVOXAMINE MALEATE 50 MG PO TABS: 150.0000 mg | ORAL_TABLET | Freq: Every day | ORAL | Status: AC

## 2018-01-02 MED ORDER — MORPHINE SULFATE (PF) 2 MG/ML IV SOLN: 2.0000 mg | Freq: Four times a day (QID) | INTRAVENOUS | 0 refills | Status: AC

## 2018-01-02 MED ORDER — MORPHINE SULFATE (PF) 2 MG/ML IV SOLN
2.0000 mg | Freq: Once | INTRAVENOUS | Status: AC
  Administered 2018-01-02: 2 mg via INTRAVENOUS
  Filled 2018-01-02: qty 1

## 2018-01-02 MED ORDER — LORAZEPAM 2 MG/ML IJ SOLN: 0.5000 mg | Freq: Three times a day (TID) | INTRAMUSCULAR | 0 refills | Status: AC

## 2018-01-02 MED ORDER — OLANZAPINE 20 MG PO TBDP: 20.0000 mg | ORAL_TABLET | Freq: Every day | ORAL | Status: AC

## 2018-01-02 MED ORDER — MORPHINE SULFATE (PF) 2 MG/ML IV SOLN
2.0000 mg | Freq: Four times a day (QID) | INTRAVENOUS | Status: DC
  Administered 2018-01-02: 2 mg via INTRAVENOUS

## 2018-01-02 NOTE — Discharge Summary (Signed)
[facility] Discharge Summary   Patient: Amanda Hall [place] ZOX:[id number]   PCP: Gordy SaversKwiatkowski, Peter F, MD DOB: Dec 31, 1950   Date of admission: 10/05/2017   Date of discharge:  01/02/2018    Discharge Diagnoses:  Principal Problem:   MDD (major depressive disorder), recurrent severe, without psychosis (HCC) Active Problems:   MDD (major depressive disorder)   Anorexia   Dysphagia   Severe malnutrition (HCC)   Anxiety   Pressure injury of skin   Aspiration pneumonia (HCC)   Acute respiratory failure with hypoxia (HCC)   Failure to thrive in adult   Pneumonia due to infectious organism   Palliative care by specialist   DNR (do not resuscitate)   Admitted From: Home Disposition: Residential hospice  Recommendations for Outpatient Follow-up:  1. Please establish care with residential hospice  Diet recommendation: Comfort feeds  Activity: The patient is advised to gradually reintroduce usual activities.  Discharge Condition: stable  Code Status: DNR/DNI, comfort care  History of present illness: As per the H and P dictated on admission, " Amanda Hall Cando is a 67 y.o. female with medical history significant for depression with anxiety and recent inpatient psychiatry admission, now presenting to the emergency department for evaluation of increased confusion, agitation, refusal to take her medications, and not eating or drinking.  Patient was admitted to inpatient psychiatry approximately 1 month ago and has been living with her sister since discharge.  She has reportedly been refusing her medications, not eating or drinking, and becoming increasingly confused and agitated.  Patient is sedated after receiving IV Ativan in the ED due to agitation and unable to contribute to the history.  She had not been expressing any specific complaints per report.  No reported trauma or fall recently.  ED Course: Upon arrival to the ED, patient is found to be afebrile, saturating well on room air,  tachypnea, tachycardic, and with blood pressure 89/55.  EKG features sinus tachycardia with rate 135, LVH, and nonspecific T wave abnormality.  Head CT is negative for acute intracranial abnormality and chest x-ray is negative for acute findings.  Chemistry panel is notable for sodium of 152, potassium 3.1, BUN 97, and creatinine of 2.07, up from 0.93 last month.  CBC features a mild leukocytosis and mild polycythemia.  Lactic acid is elevated to 2.49 and urinalysis is unremarkable.  Patient was given 2 L of IV fluids, 20 mEq of IV potassium, 1 mg IV Ativan, and blood and urine cultures were collected in the ED.  Tachycardia has improved, blood pressure remained stable, and the patient is sedated after Ativan.  She will be admitted for ongoing evaluation and management of increased confusion and agitation with anorexia and acute kidney injury."  Hospital Course:  Summary of her active problems in the hospital is as following. Sepsis due to right lower lobe pneumonia Dysphagia with very poor oral intake-severe protein calorie malnutrition - Appreciate palliative care input-currently decided to transition patient to comfort care without any further antibiotic use or diagnostic work-up. -We will plan on holding off on PEG tube, encourage oral feeding at this time as much as possible. Currently unable to take anything by mouth due to lethargy.  Tube feeds are on hold.  Comfort care.  Hypokalemia/hypophosphatemia and hypomagnesemia - Improving.  Concerns for refeeding syndrome. Currently comfort care  Tachycardia and tachypnea. Likely secondary to anxiety and respiratory distress. We will place the patient on scheduled lorazepam as well as scheduled morphine. Has not received any medication in last 48 hours.  Severe anxiety and depression - Initially patient is on Zyprexa, Luvox, Remeron, Klonopin.  Psychiatry was consulted. Now comfort care.  Cardiac amyloid? -This was suspected on  echocardiogram.  But hold off on further investigation as patient is now comfort care.  Severe deconditioning and generalized weakness -Patient is bedbound, does not ambulate  Multiple pressure related skin injury Iron deficiency anemia  Severe protein calorie malnutrition G-tube was placed. Due to rapid decline patient is now comfort care.  On the day of the discharge the patient's symptoms are better controlled, and no other acute medical condition were reported. the patient was felt safe to be discharge at [facility] with residential hospice.  Consultants: Palliative care team Psychiatry Cardiology Intervention radiology  Procedures: PEG tube placement Echocardiogram  DISCHARGE MEDICATION: Allergies as of 01/02/2018      Reactions   Codeine Other (See Comments)   "Makes my head feel crazy"   Sulfonamide Derivatives Nausea Only   Tetanus Toxoid Other (See Comments)   "Made a Knot" (at site)      Medication List    STOP taking these medications   CULTURELLE Caps   hydrocortisone cream 1 %   LORazepam 1 MG tablet Commonly known as:  ATIVAN Replaced by:  LORazepam 2 MG/ML injection   megestrol 40 MG tablet Commonly known as:  MEGACE   polyethylene glycol packet Commonly known as:  MIRALAX   simvastatin 40 MG tablet Commonly known as:  ZOCOR   tetrahydrozoline-zinc 0.05-0.25 % ophthalmic solution Commonly known as:  VISINE-AC   venlafaxine XR 37.5 MG 24 hr capsule Commonly known as:  EFFEXOR-XR     TAKE these medications   fluvoxaMINE 50 MG tablet Commonly known as:  LUVOX Place 3 tablets (150 mg total) into feeding tube at bedtime. What changed:    how to take this  additional instructions   LORazepam 2 MG/ML injection Commonly known as:  ATIVAN Inject 0.25 mLs (0.5 mg total) into the vein 3 (three) times daily. Replaces:  LORazepam 1 MG tablet   mirtazapine 45 MG disintegrating tablet Commonly known as:  REMERON SOL-TAB Place 1 tablet  (45 mg total) into feeding tube at bedtime. What changed:  how to take this   morphine 2 MG/ML injection Inject 1 mL (2 mg total) into the vein every 6 (six) hours.   OLANZapine zydis 20 MG disintegrating tablet Commonly known as:  ZYPREXA Place 1 tablet (20 mg total) under the tongue at bedtime. What changed:    medication strength  how much to take  how to take this      Allergies  Allergen Reactions  . Codeine Other (See Comments)    "Makes my head feel crazy"  . Sulfonamide Derivatives Nausea Only  . Tetanus Toxoid Other (See Comments)    "Made a Knot" (at site)   Discharge Instructions    Increase activity slowly   Complete by:  As directed      Discharge Exam: Filed Weights   12/18/17 2210 12/19/17 0500 12/23/17 2147  Weight: 46.9 kg 46.9 kg 45.5 kg   Vitals:   01/01/18 0807 01/02/18 0731  BP: (!) 153/97 124/70  Pulse: (!) 131 (!) 128  Resp: 16 (!) 23  Temp: 98.6 F (37 C) 98 F (36.7 C)  SpO2: 96% (!) 51%   General: Appear in mild distress, no Rash; Oral Mucosa dry Cardiovascular: S1 and S2 Present, no Murmur, no JVD Respiratory: Bilateral Air entry present and basal Crackles, no wheezes Abdomen: Bowel Sound present,  Extremities: no Pedal edema, Neurology: Drowsy and lethargic  The results of significant diagnostics from this hospitalization (including imaging, microbiology, ancillary and laboratory) are listed below for reference.    Significant Diagnostic Studies: Dg Chest Port 1 View  Result Date: 12/26/2017 CLINICAL DATA:  Pneumonia EXAM: PORTABLE CHEST 1 VIEW COMPARISON:  12/25/2017 FINDINGS: Extensive and progressive airspace disease on the right. Milder airspace opacity at the left base. Normal heart size. No effusion or cavitation seen. Presumed T tack over the left upper quadrant IMPRESSION: Significant worsening of pneumonia that is more extensive on the right. Electronically Signed   By: Marnee Spring M.D.   On: 12/26/2017 13:54   Dg  Chest Port 1 View  Result Date: 12/25/2017 CLINICAL DATA:  Cough. EXAM: PORTABLE CHEST 1 VIEW COMPARISON:  October 05, 2017 FINDINGS: Right basilar infiltrate. The heart, hila, mediastinum, lungs, and pleura are otherwise unremarkable. IMPRESSION: Right lower lobe infiltrate, likely pneumonia. Recommend follow-up to resolution. Electronically Signed   By: Gerome Sam III M.D   On: 12/25/2017 09:30    Microbiology: Recent Results (from the past 240 hour(s))  MRSA PCR Screening     Status: Abnormal   Collection Time: 12/27/17 12:35 AM  Result Value Ref Range Status   MRSA by PCR POSITIVE (A) NEGATIVE Final    Comment:        The GeneXpert MRSA Assay (FDA approved for NASAL specimens only), is one component of a comprehensive MRSA colonization surveillance program. It is not intended to diagnose MRSA infection nor to guide or monitor treatment for MRSA infections. RESULT CALLED TO, READ BACK BY AND VERIFIED WITH: D OWEN RN 0300 12/27/17 A BROWNING Performed at [facility], [address], [place], [place] [address]      Labs: CBC: Recent Labs  Lab 12/27/17 0257  WBC 12.9*  HGB 8.7*  HCT 26.8*  MCV 92.7  PLT 331   Basic Metabolic Panel: Recent Labs  Lab 12/27/17 0257  NA 137  K 3.7  CL 98  CO2 28  GLUCOSE 158*  BUN 75*  CREATININE 0.72  CALCIUM 9.4  MG 1.9  PHOS 4.0   Liver Function Tests: No results for input(s): AST, ALT, ALKPHOS, BILITOT, PROT, ALBUMIN in the last 168 hours. No results for input(s): LIPASE, AMYLASE in the last 168 hours. No results for input(s): AMMONIA in the last 168 hours. Cardiac Enzymes: No results for input(s): CKTOTAL, CKMB, CKMBINDEX, TROPONINI in the last 168 hours. BNP (last 3 results) No results for input(s): BNP in the last 8760 hours. CBG: Recent Labs  Lab 12/26/17 1329 12/28/17 0819  GLUCAP 212* 144*   Time spent: 35 minutes  Signed:  Lynden Oxford  [facility]  01/02/2018  , 11:38 AM

## 2018-01-02 NOTE — Progress Notes (Signed)
Palliative Medicine RN Note: Symptom check/check for transport stability.  PMT rec'd a call this am that sats are in the 50s. BP remains 124/70. Resp rate at my visit was over 24. All limbs are warm to the touch with palpable pulses. She is non-responsive with mouth gaping open. She is dusky.  Discussed patient, her VS, her presentation, and her care plan/meds with Dr Neale BurlyFreeman. He feels she is stable for transport [facility]. She will need continued medications for symptom control. Dr Neale BurlyFreeman ordered one dose of morphine now to try to get her more comfortable for transport.   Updated [facility] liaison Carley Hammedva; she is meeting with Jadi's sister at [id number] for consents. Dr Allena KatzPatel also notified. Updated RN Shammy.  Margret ChanceMelanie G. Iyanni Hepp, RN, BSN, [facility] Palliative Medicine Team 01/02/2018 10:36 AM Office [phone]

## 2018-01-02 NOTE — Progress Notes (Signed)
[facility] and given to SunGardSylvia Tuck RN. Will call [facility] and set up transport for as soon as possible, discharge summary has been sent and they are expecting patient.

## 2018-01-02 NOTE — Progress Notes (Signed)
[facility] patient today. Paperwork completed with sister for transfer today.   Please send discharge summary to [phone].  RN please call report to [phone].  Thank you,  Forrestine Himva Davis, LCSW [phone]

## 2018-01-02 NOTE — Clinical Social Work Note (Signed)
CSW facilitated patient discharge including contacting patient family and facility to confirm patient discharge plans. Clinical information faxed to facility and family agreeable with plan. RN arranged [facility]. RN has already called report.  CSW will sign off for now as social work intervention is no longer needed. Please consult us again if new needs arise.  Charlynn CourtSarah Michie Molnar, CSW [phone]

## 2018-01-02 NOTE — Plan of Care (Signed)
Discussed with patient and sister over the phone plan of care for the evening, comfort care and turning every 3 hours throughout the night for comfort with no teach back displayed from the patient.

## 2018-01-11 DEATH — deceased

## 2018-03-01 ENCOUNTER — Ambulatory Visit: Payer: Medicare Other | Admitting: Cardiovascular Disease

## 2019-08-26 IMAGING — CT CT HEAD W/O CM
4 series · 15 of 47 positions shown, 17 images · non-contrast
Comparison: None.

CLINICAL DATA: Altered level of consciousness

EXAM:
CT HEAD WITHOUT CONTRAST
TECHNIQUE: Contiguous axial images were obtained from the base of the skull
through the vertex without intravenous contrast.

[Series 3: head wo · axial · 0.37mm/px · z∈[-181,-40]mm · 7 of 41 slices shown, 9 images]
[im 6/41  brain]
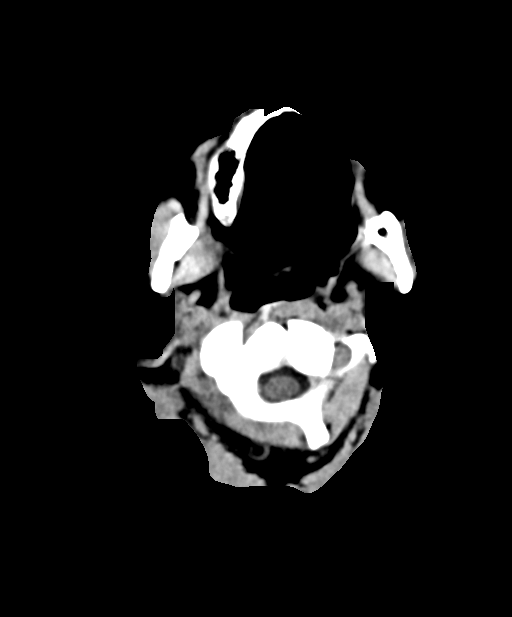
[im 6/41  bone]
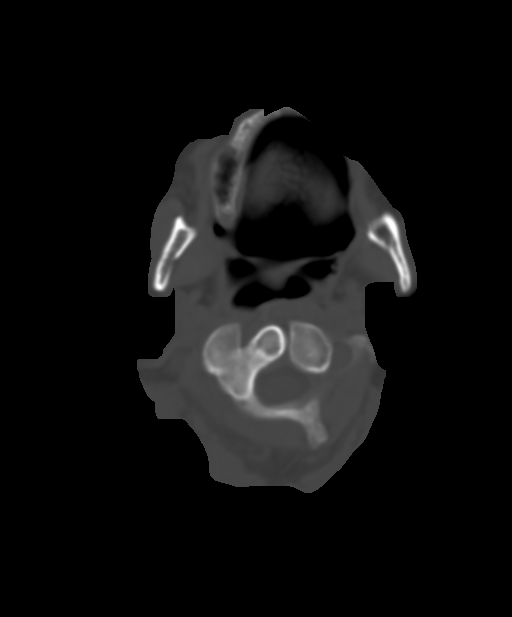
[im 11/41  brain]
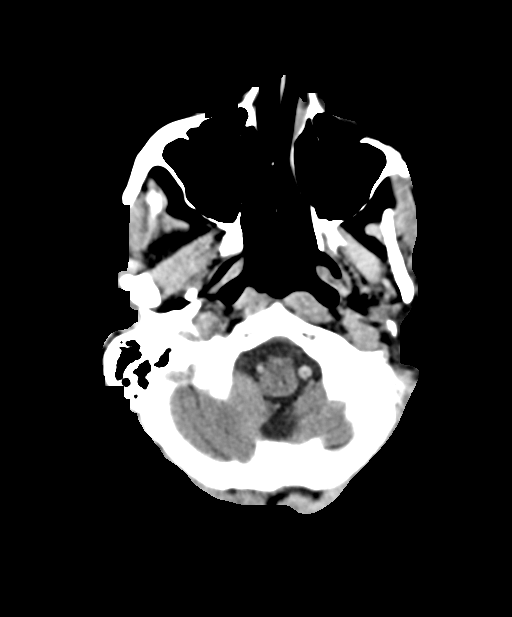
[im 16/41  brain]
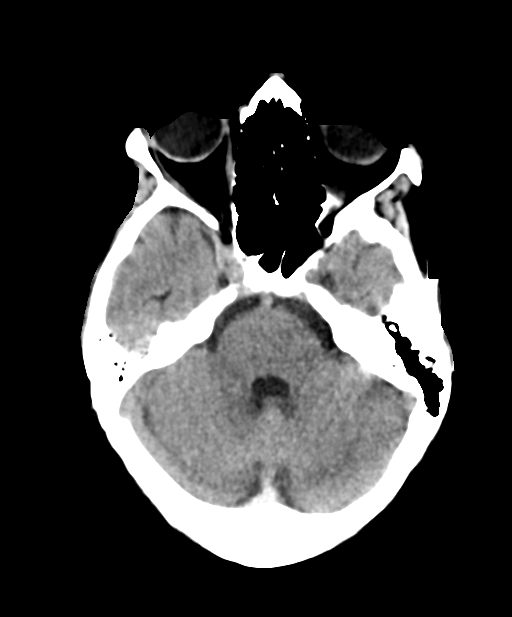
[im 21/41  brain]
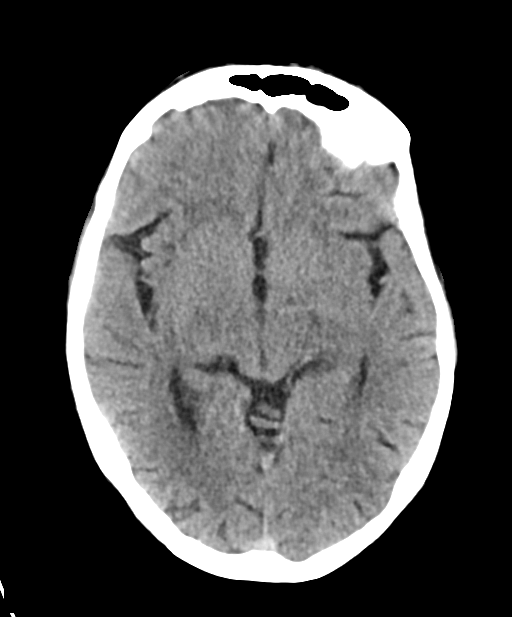
[im 26/41  brain]
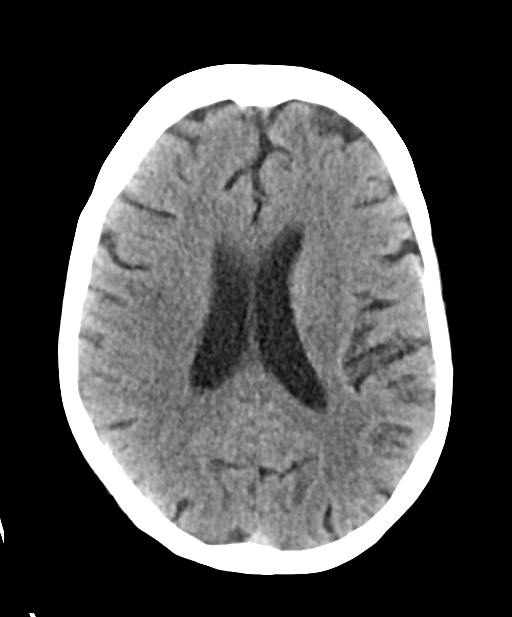
[im 26/41  bone]
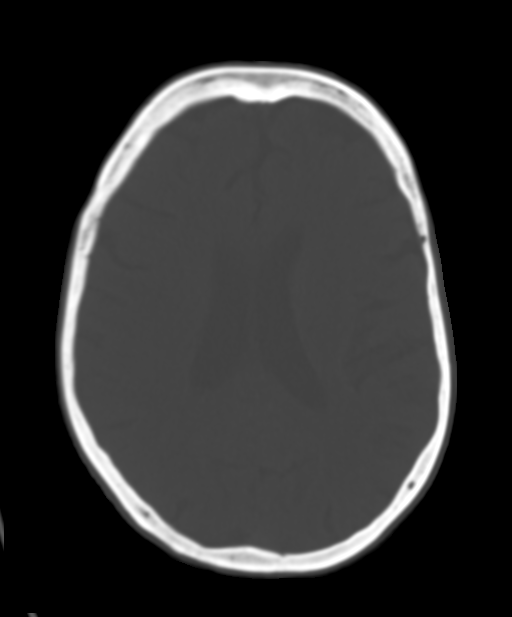
[im 31/41  brain]
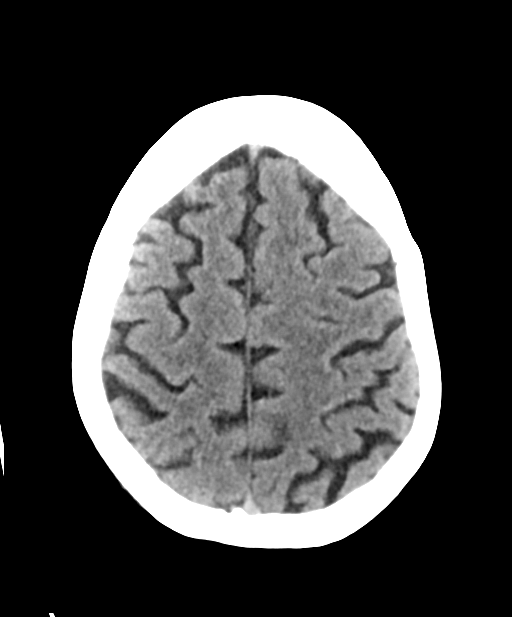
[im 36/41  brain]
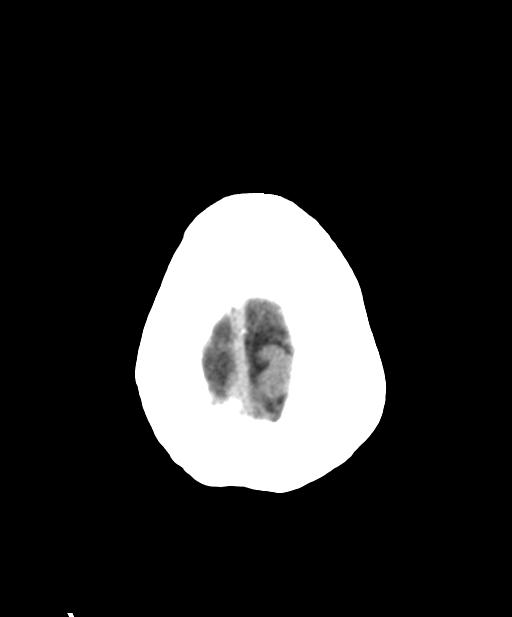

[Series 4: head bone · axial · 0.38mm/px · z∈[-190,-171]mm · 2 of 102 slices shown]
[im 11/102  bone]
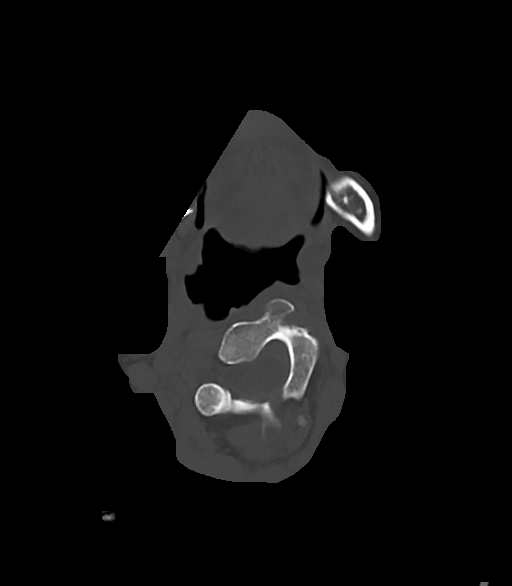
[im 21/102  bone]
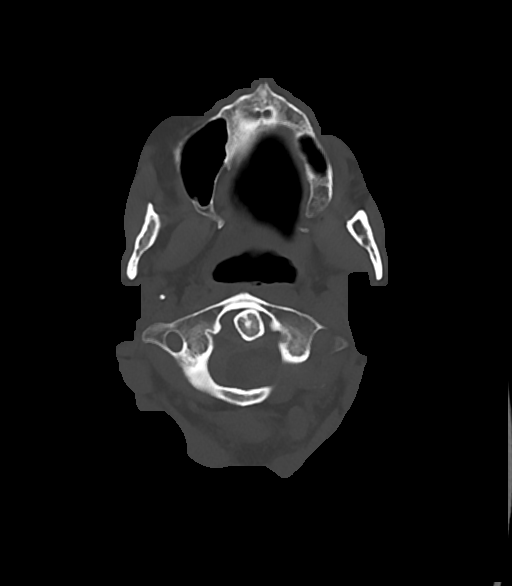

[Series 5: cor soft · coronal · 0.38mm/px · 3 of 71 slices shown]
[im 24/71  brain]
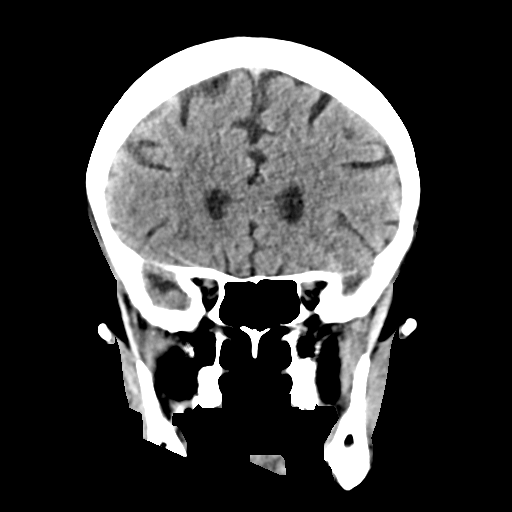
[im 32/71  brain]
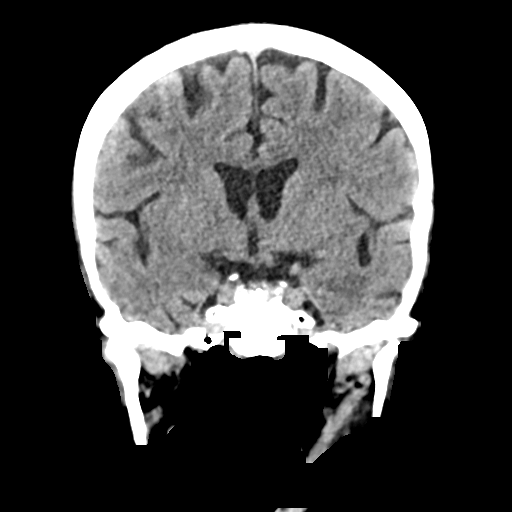
[im 39/71  brain]
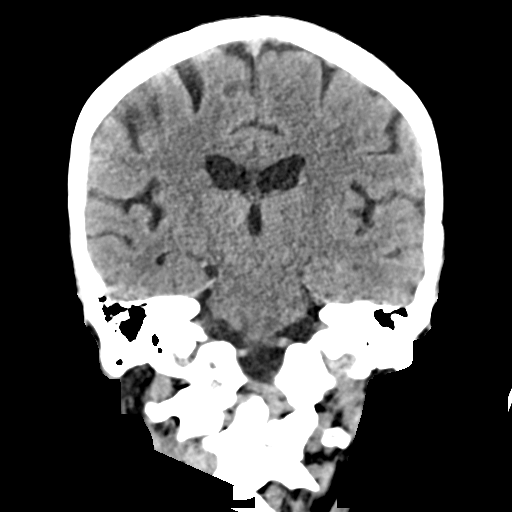

[Series 6: sag soft · sagittal · 0.39mm/px · 3 of 61 slices shown]
[im 28/61  brain]
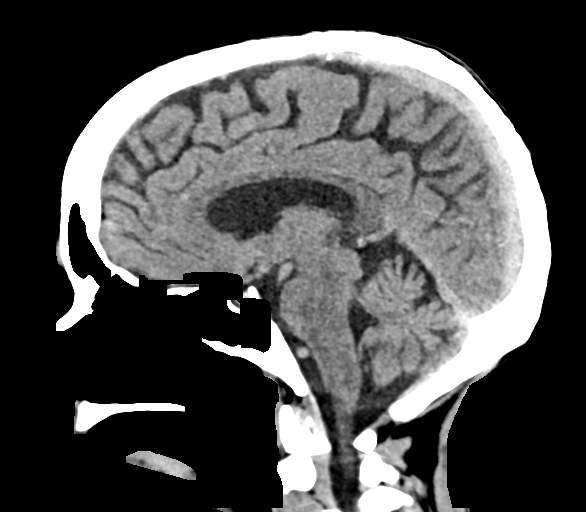
[im 31/61  brain]
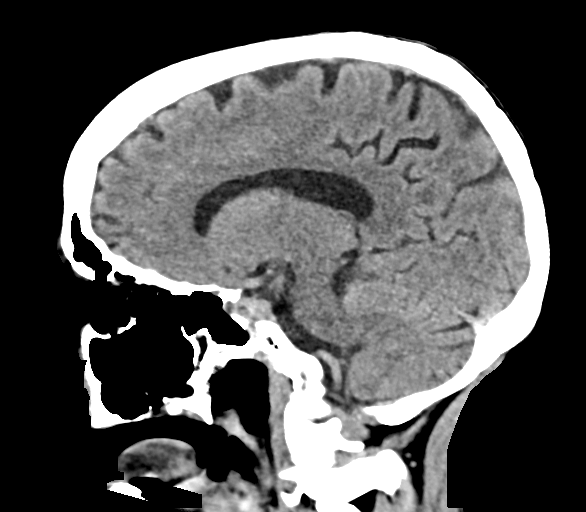
[im 33/61  brain]
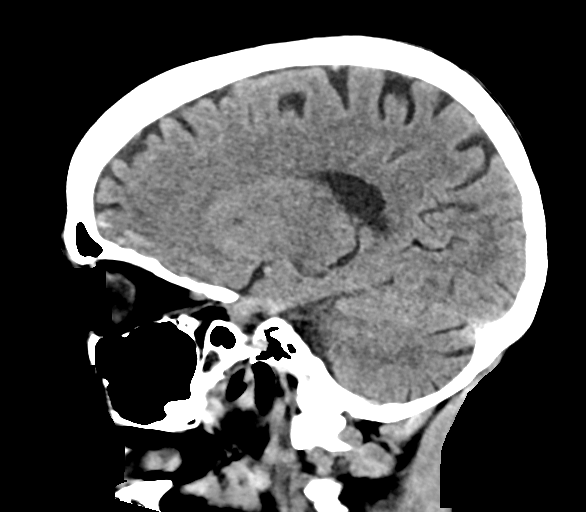

[15 of 47 positions shown; findings below may reference images not displayed]

FINDINGS: Brain: Mild age related volume loss. No acute intracranial
abnormality. Specifically, no hemorrhage, hydrocephalus, mass
lesion, acute infarction, or significant intracranial injury.

Vascular: No hyperdense vessel or unexpected calcification.

Skull: No acute calvarial abnormality.

Sinuses/Orbits: Visualized paranasal sinuses and mastoids clear.
Orbital soft tissues unremarkable.

Other: None
IMPRESSION: No acute intracranial abnormality.

## 2019-10-16 IMAGING — CT CT ABDOMEN W/O CM
2 of 4 series · 16 of 46 positions shown, 18 images · non-contrast
Comparison: Acute abdominal radiographic series - 04/18/2017

CLINICAL DATA: Evaluate anatomy for potential percutaneous
gastrostomy tube placement.

EXAM:
CT ABDOMEN WITHOUT CONTRAST
TECHNIQUE: Multidetector CT imaging of the abdomen was performed following the
standard protocol without IV contrast.

[Series 3: abd/ pelvis 5.0 i30f 2 · axial · 0.62mm/px · z∈[+1032,+1232]mm · 13 of 46 slices shown, 15 images]
[im 3/46  soft-tissue]
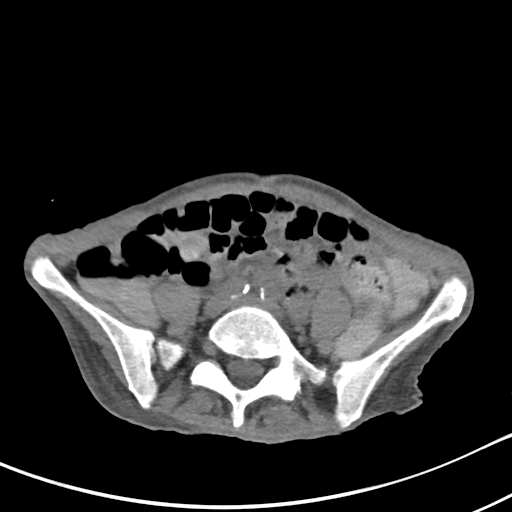
[im 3/46  bone]
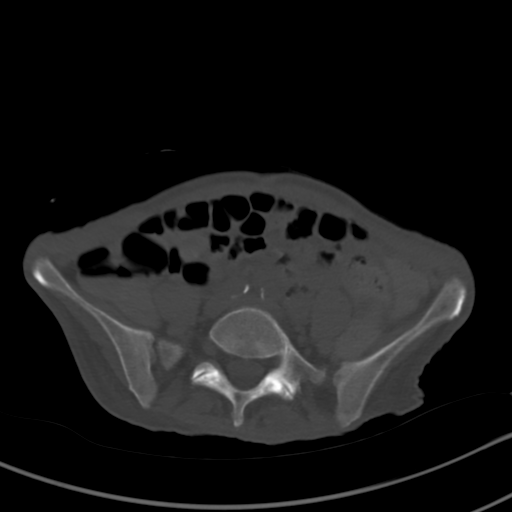
[im 7/46  soft-tissue]
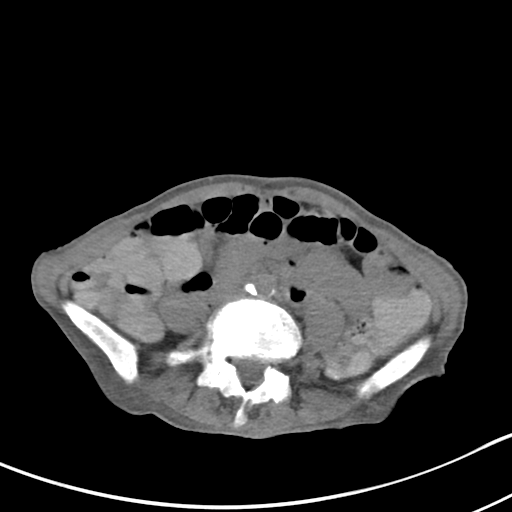
[im 9/46  soft-tissue]
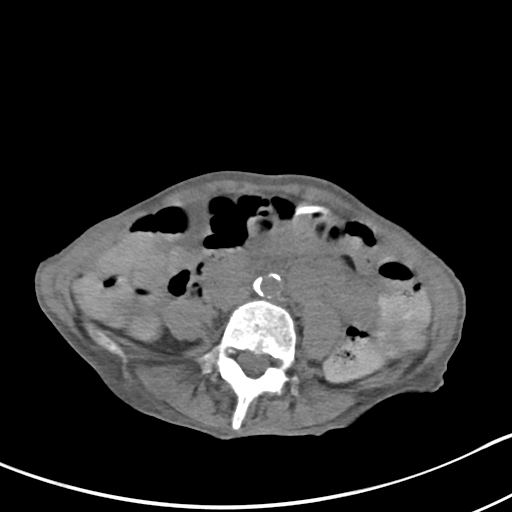
[im 13/46  soft-tissue]
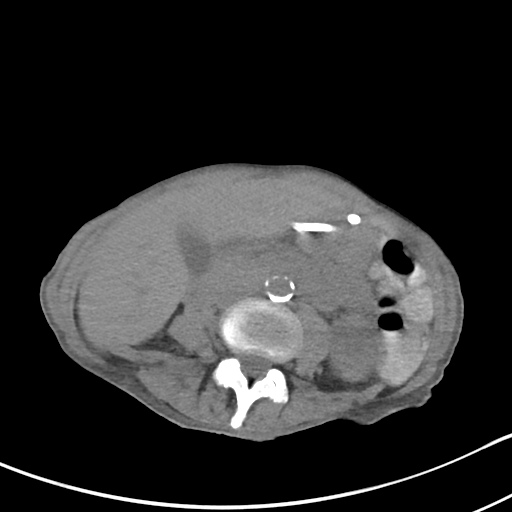
[im 16/46  soft-tissue]
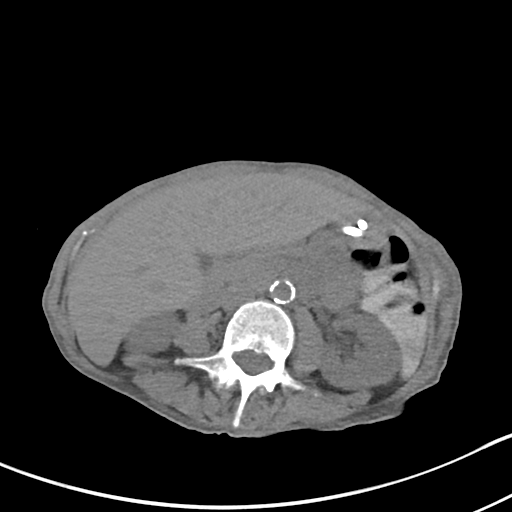
[im 20/46  soft-tissue]
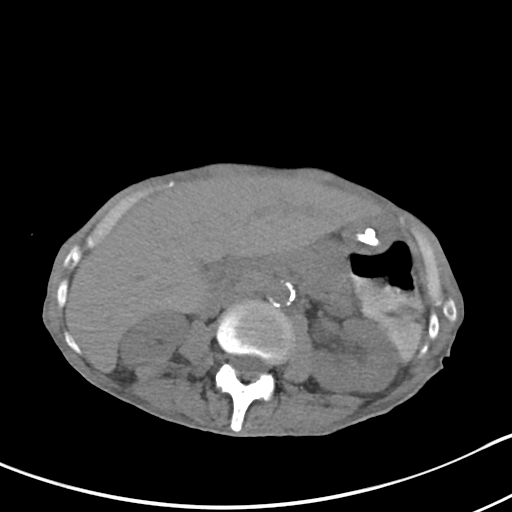
[im 24/46  soft-tissue]
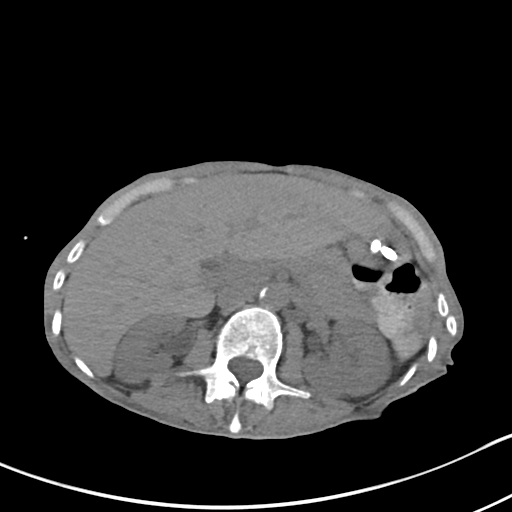
[im 26/46  soft-tissue]
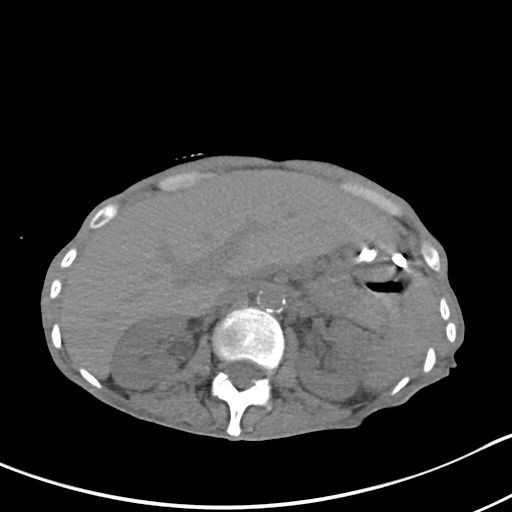
[im 31/46  soft-tissue]
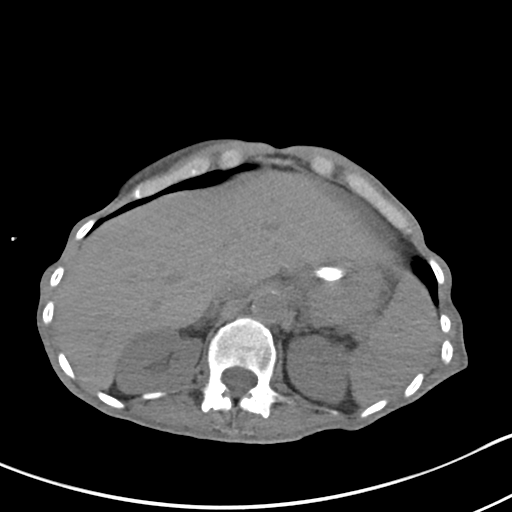
[im 31/46  bone]
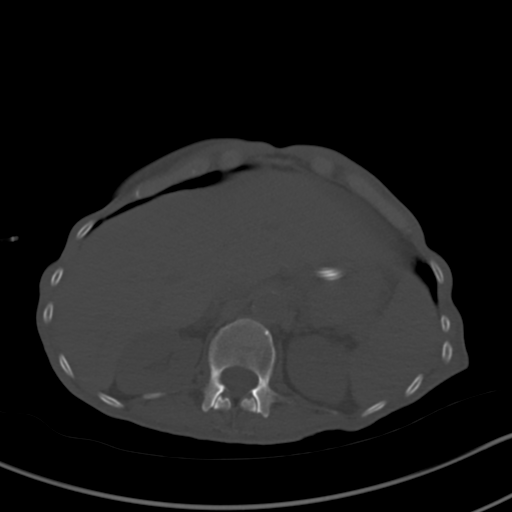
[im 33/46  soft-tissue]
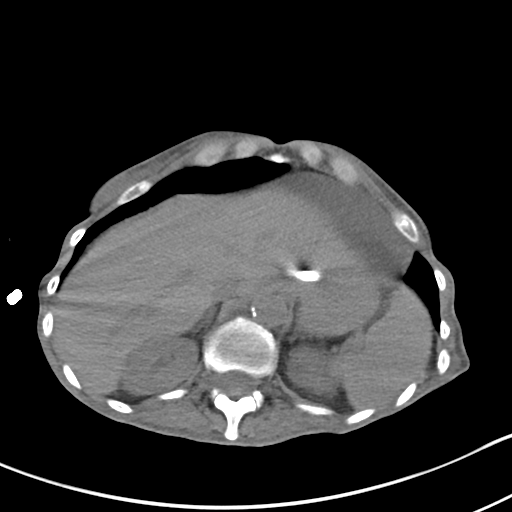
[im 37/46  soft-tissue]
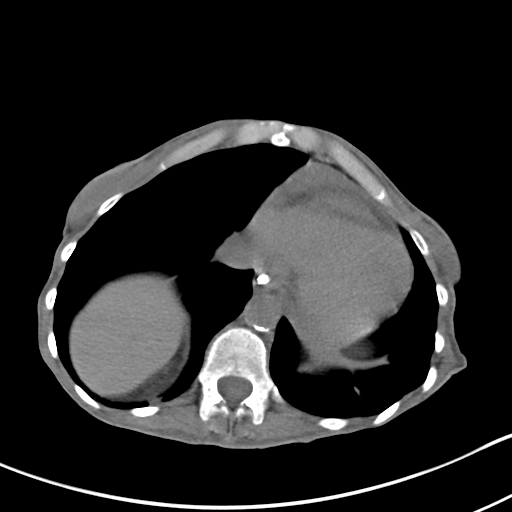
[im 39/46  soft-tissue]
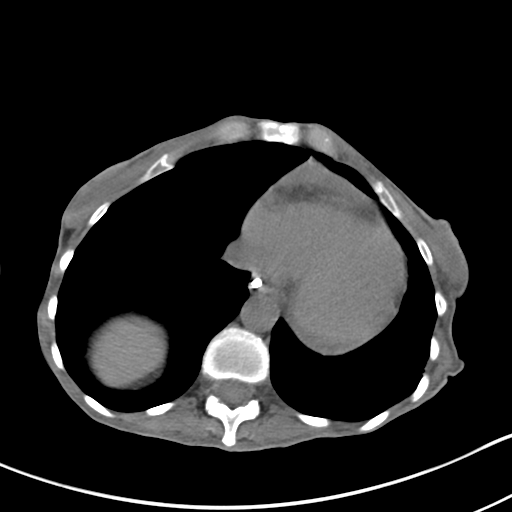
[im 43/46  soft-tissue]
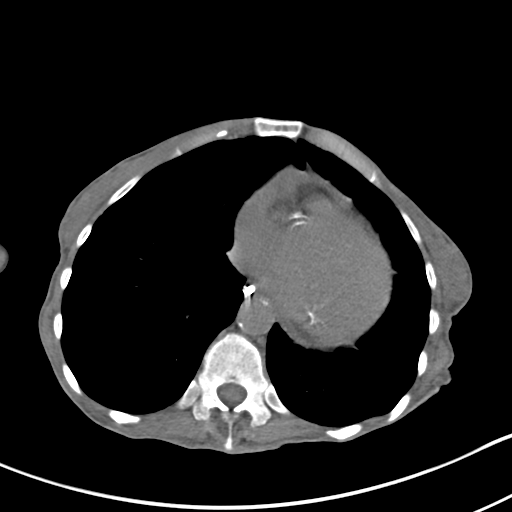

[Series 6: cor st · coronal · 0.50mm/px · 3 of 82 slices shown]
[im 28/82  soft-tissue]
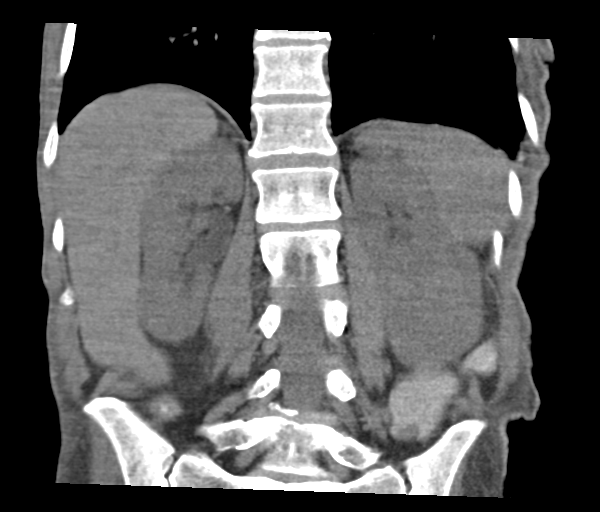
[im 37/82  soft-tissue]
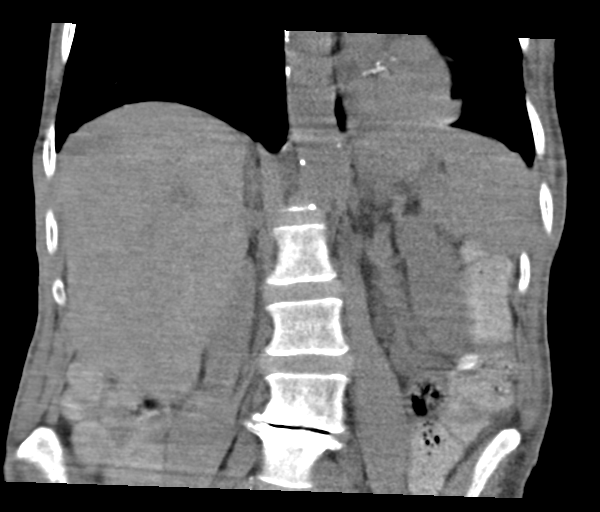
[im 46/82  soft-tissue]
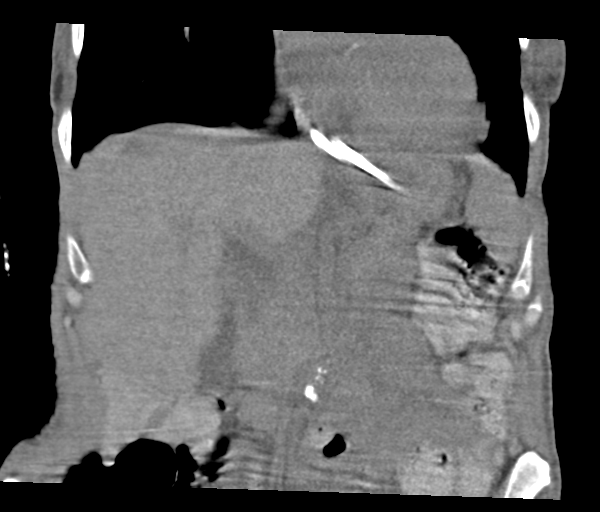

[16 of 46 positions shown; findings below may reference images not displayed]

FINDINGS: Lack of intravenous contrast limits the ability to evaluate solid
abdominal organs. The examination is further degraded secondary to
patient respiratory artifact.

Lower chest: Limited visualization of the lower thorax demonstrates
minimal subsegmental atelectasis within the imaged left lower lobe.
No discrete focal airspace opacities. No pleural effusion.

Normal heart size.  No pericardial effusion.

Hepatobiliary: Normal hepatic contour. Normal noncontrast appearance
of the gallbladder given degree of distention. No ascites.

Pancreas: Normal noncontrast appearance of the pancreas

Spleen: Normal noncontrast appearance the spleen

Adrenals/Urinary Tract: Normal noncontrast appearance of the
bilateral kidneys. No renal stones. No urine obstruction or
perinephric stranding.

Normal noncontrast appearance the adrenal glands. The urinary
bladder was not imaged.

Stomach/Bowel: The anterior wall the stomach is well apposed against
the ventral wall of the abdomen though note is made of hypertrophy
of the lateral segment left lobe of the liver.

Enteric tube is coiled with gastric lumen. No evidence of enteric
obstruction. No pneumoperitoneum, pneumatosis or portal venous gas.

Vascular/Lymphatic: Atherosclerotic plaque within the abdominal
aorta.

No definitive bulky retroperitoneal or mesenteric adenopathy on this
noncontrast examination.

Other: Mild diffuse body wall anasarca.

Musculoskeletal: Severe DDD of L4-L5 with disc space height loss,
endplate irregularity and small posteriorly directed disc osteophyte
complex at this location.
IMPRESSION: 1. Gastric anatomy amenable to percutaneous gastrostomy tube
placement as indicated though note, there is hypertrophy of the left
lobe of the liver and sonographic evaluation to demarcate the liver
edge could be performed prior to attempted percutaneous gastrostomy
tube placement as deemed appropriate by the performing
interventional radiologist.
2.  Aortic Atherosclerosis ([id number]).

## 2019-10-19 IMAGING — XA IR PERC PLACEMENT GASTROSTOMY
1 series · 1 of 1 positions shown · non-contrast
Comparison: none

INDICATION: Dysphagia, malnutrition

[Series 1: fl (-) angio · 1 of 1 slices shown]
[im 1/1]
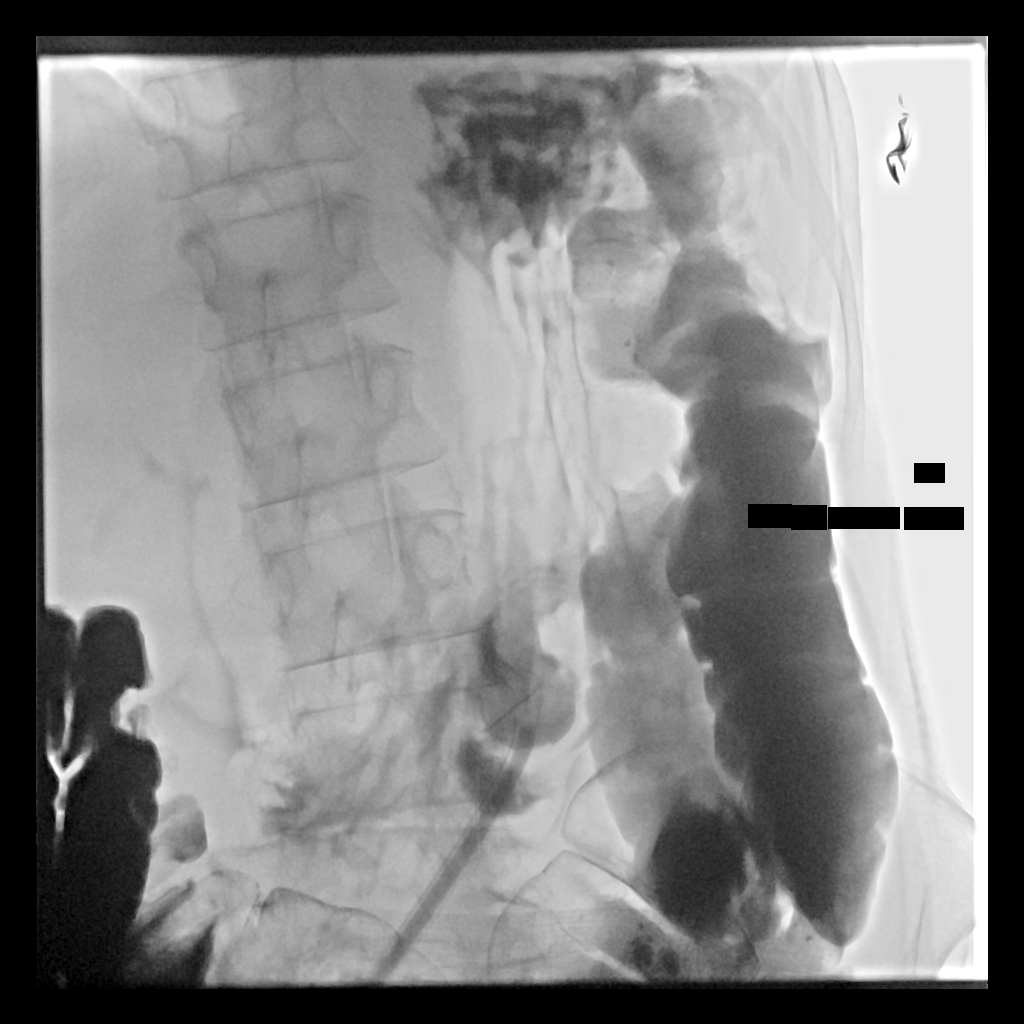

[1 of 1 positions shown; findings below may reference images not displayed]

EXAM:
FLUOROSCOPIC 20 FRENCH PULL-THROUGH GASTROSTOMY

Radiologist:  Yen, Hbibha

Guidance:  Fluoroscopic

MEDICATIONS:
1 g Ancef; Antibiotics were administered within 1 hour of the
procedure. Glucagon 0.5 mg IV

ANESTHESIA/SEDATION:
Versed 1.5 mg IV; Fentanyl 75 mcg IV

Moderate Sedation Time:  16 minutes

The patient was continuously monitored during the procedure by the
interventional radiology nurse under my direct supervision.

CONTRAST:  10 cc-administered into the gastric lumen.

FLUOROSCOPY TIME:  Fluoroscopy Time: 4 minutes 30 seconds (8 mGy).

COMPLICATIONS:
None immediate.

PROCEDURE:
Informed consent was obtained from the patient following explanation
of the procedure, risks, benefits and alternatives. The patient
understands, agrees and consents for the procedure. All questions
were addressed. A time out was performed.

Maximal barrier sterile technique utilized including caps, mask,
sterile gowns, sterile gloves, large sterile drape, hand hygiene,
and betadine prep.

The left upper quadrant was sterilely prepped and draped. An oral
gastric catheter was inserted into the stomach under fluoroscopy.
The existing nasogastric feeding tube was removed. Air was injected
into the stomach for insufflation and visualization under
fluoroscopy. The air distended stomach was confirmed beneath the
anterior abdominal wall in the frontal and lateral projections.
Under sterile conditions and local anesthesia, a 17 gauge trocar
needle was utilized to access the stomach percutaneously beneath the
left subcostal margin. Needle position was confirmed within the
stomach under biplane fluoroscopy. Contrast injection confirmed
position also. A single T tack was deployed for gastropexy. Over an
Amplatz guide wire, a 9-French sheath was inserted into the stomach.
A snare device was utilized to capture the oral gastric catheter.
The snare device was pulled retrograde from the stomach up the
esophagus and out the oropharynx. The 20-French pull-through
gastrostomy was connected to the snare device and pulled antegrade
through the oropharynx down the esophagus into the stomach and then
through the percutaneous tract external to the patient. The
gastrostomy was assembled externally. Contrast injection confirms
position in the stomach. Images were obtained for documentation. The
patient tolerated procedure well. No immediate complication.
IMPRESSION: Fluoroscopic insertion of a 20-French "pull-through" gastrostomy.
